# Patient Record
Sex: Male | Born: 1950 | Race: Black or African American | Hispanic: No | Marital: Single | Smoking: Former smoker
Health system: Southern US, Community
[De-identification: ages and names within clinical notes are randomized; demographics above are authoritative.]

## PROBLEM LIST (undated history)

## (undated) ENCOUNTER — Emergency Department: Admission: EM | Source: Home / Self Care

## (undated) DIAGNOSIS — C3432 Malignant neoplasm of lower lobe, left bronchus or lung: Secondary | ICD-10-CM

## (undated) DIAGNOSIS — E785 Hyperlipidemia, unspecified: Secondary | ICD-10-CM

## (undated) DIAGNOSIS — C801 Malignant (primary) neoplasm, unspecified: Secondary | ICD-10-CM

## (undated) DIAGNOSIS — I10 Essential (primary) hypertension: Secondary | ICD-10-CM

## (undated) DIAGNOSIS — R0600 Dyspnea, unspecified: Secondary | ICD-10-CM

## (undated) HISTORY — DX: Essential (primary) hypertension: I10

## (undated) HISTORY — DX: Hyperlipidemia, unspecified: E78.5

## (undated) MED FILL — Dexamethasone Sodium Phosphate Inj 100 MG/10ML: INTRAMUSCULAR | Qty: 1 | Status: AC

---

## 2019-09-02 ENCOUNTER — Emergency Department: Payer: Medicaid Other

## 2019-09-02 ENCOUNTER — Encounter: Payer: Self-pay | Admitting: Radiology

## 2019-09-02 ENCOUNTER — Emergency Department
Admission: EM | Admit: 2019-09-02 | Discharge: 2019-09-03 | Disposition: A | Discharge status: Home / Self Care | Payer: Medicaid Other | Attending: Emergency Medicine | Admitting: Emergency Medicine

## 2019-09-02 DIAGNOSIS — R0602 Shortness of breath: Secondary | ICD-10-CM | POA: Diagnosis present

## 2019-09-02 DIAGNOSIS — R918 Other nonspecific abnormal finding of lung field: Secondary | ICD-10-CM | POA: Diagnosis not present

## 2019-09-02 DIAGNOSIS — I10 Essential (primary) hypertension: Secondary | ICD-10-CM | POA: Diagnosis not present

## 2019-09-02 DIAGNOSIS — Z87891 Personal history of nicotine dependence: Secondary | ICD-10-CM | POA: Insufficient documentation

## 2019-09-02 LAB — CBC
HCT: 36.5 % — ABNORMAL LOW (ref 39.0–52.0)
Hemoglobin: 11.7 g/dL — ABNORMAL LOW (ref 13.0–17.0)
MCH: 25.9 pg — ABNORMAL LOW (ref 26.0–34.0)
MCHC: 32.1 g/dL (ref 30.0–36.0)
MCV: 80.9 fL (ref 80.0–100.0)
Platelets: 388 10*3/uL (ref 150–400)
RBC: 4.51 MIL/uL (ref 4.22–5.81)
RDW: 14.8 % (ref 11.5–15.5)
WBC: 10.2 10*3/uL (ref 4.0–10.5)
nRBC: 0 % (ref 0.0–0.2)

## 2019-09-02 LAB — BASIC METABOLIC PANEL
Anion gap: 11 (ref 5–15)
BUN: 16 mg/dL (ref 8–23)
CO2: 22 mmol/L (ref 22–32)
Calcium: 9.2 mg/dL (ref 8.9–10.3)
Chloride: 104 mmol/L (ref 98–111)
Creatinine, Ser: 1.05 mg/dL (ref 0.61–1.24)
GFR calc Af Amer: >60 mL/min (ref >60)
GFR calc non Af Amer: >60 mL/min (ref >60)
Glucose, Bld: 140 mg/dL — ABNORMAL HIGH (ref 70–99)
Potassium: 3.6 mmol/L (ref 3.5–5.1)
Sodium: 137 mmol/L (ref 135–145)

## 2019-09-02 LAB — TROPONIN I (HIGH SENSITIVITY)
Troponin I (High Sensitivity): 8 ng/L (ref <18)
Troponin I (High Sensitivity): 7 ng/L (ref <18)

## 2019-09-02 MED ORDER — IOHEXOL 350 MG/ML SOLN
75.0000 mL | Freq: Once | INTRAVENOUS | Status: AC | PRN
  Administered 2019-09-02: 75 mL via INTRAVENOUS

## 2019-09-02 MED ORDER — IPRATROPIUM-ALBUTEROL 0.5-2.5 (3) MG/3ML IN SOLN
3.0000 mL | Freq: Once | RESPIRATORY_TRACT | Status: AC
  Administered 2019-09-02: 3 mL via RESPIRATORY_TRACT
  Filled 2019-09-02: qty 3

## 2019-09-02 NOTE — ED Provider Notes (Signed)
[facility] Emergency Department Provider Note  ____________________________________________  Time seen: Approximately 11:52 PM  I have reviewed the triage vital signs and the nursing notes.   HISTORY  Chief Complaint Shortness of Breath   HPI Mitchell Frye is a 69 y.o. male with a history of hypertension hyperlipidemia who presents for evaluation of cough and shortness of breath.  Patient reports a cough productive of clear phlegm for over a year.  Over the last week he started to have shortness of breath.  The shortness of breath is worse with exertion.  He denies any wheezing.  He reports that the cough has improved since he was placed on Protonix for possible GERD as the etiology of the cough by his primary care doctor.  He denies hemoptysis.  The phlegm is clear.  No fever.  He also complaining of mild right-sided chest discomfort that he describes as a soreness mostly when he pushes on it.  No pleuritic chest pain.  This chest pain has been ongoing for the last week as well.  No personal or family history of PE or DVT, no recent travel immobilization, no leg pain or swelling, no hemoptysis or exogenous hormones.  Patient denies a history of COPD or emphysema however he does have a history of smoking.  He stopped in the 1990s.  Has not had a fever.  No Covid shots.   PMH HTN HLD  Allergies Patient has no known allergies.  No family history on file.  Social History Smoking - former Alcohol - yes Drugs - no  Review of Systems  Constitutional: Negative for fever. Eyes: Negative for visual changes. ENT: Negative for sore throat. Neck: No neck pain  Cardiovascular: Negative for chest pain. Respiratory: + shortness of breath and cough Gastrointestinal: Negative for abdominal pain, vomiting or diarrhea. Genitourinary: Negative for dysuria. Musculoskeletal: Negative for back pain. Skin: Negative for rash. Neurological: Negative for headaches,  weakness or numbness. Psych: No SI or HI  ____________________________________________   PHYSICAL EXAM:  VITAL SIGNS: ED Triage Vitals  Enc Vitals Group     BP 09/02/19 2014 131/74     Pulse Rate 09/02/19 2014 87     Resp 09/02/19 2014 (!) 22     Temp 09/02/19 2014 99.5 F (37.5 C)     Temp Source 09/02/19 2014 Oral     SpO2 09/02/19 2014 100 %     Weight --      Height --      Head Circumference --      Peak Flow --      Pain Score 09/02/19 2251 3     Pain Loc --      Pain Edu? --      Excl. in [place]? --     Constitutional: Alert and oriented. Well appearing and in no apparent distress. HEENT:      Head: Normocephalic and atraumatic.         Eyes: Conjunctivae are normal. Sclera is non-icteric.       Mouth/Throat: Mucous membranes are moist.       Neck: Supple with no signs of meningismus. Cardiovascular: Regular rate and rhythm. No murmurs, gallops, or rubs. 2+ symmetrical distal pulses are present in all extremities. No JVD. Respiratory: Normal respiratory effort.  Good air movement with faint expiratory wheezes bilaterally.  No crackles gastrointestinal: Soft, non tender. Musculoskeletal: No edema, cyanosis, or erythema of extremities. Neurologic: Normal speech and language. Face is symmetric. Moving all extremities. No gross focal  neurologic deficits are appreciated. Skin: Skin is warm, dry and intact. No rash noted. Psychiatric: Mood and affect are normal. Speech and behavior are normal.  ____________________________________________   LABS (all labs ordered are listed, but only abnormal results are displayed)  Labs Reviewed  BASIC METABOLIC PANEL - Abnormal; Notable for the following components:      Result Value   Glucose, Bld 140 (*)    All other components within normal limits  CBC - Abnormal; Notable for the following components:   Hemoglobin 11.7 (*)    HCT 36.5 (*)    MCH 25.9 (*)    All other components within normal limits  TROPONIN I (HIGH  SENSITIVITY)  TROPONIN I (HIGH SENSITIVITY)   ____________________________________________  EKG  ED ECG REPORT I, Rudene Re, the attending physician, personally viewed and interpreted this ECG.  Normal sinus rhythm, rate of 83, normal intervals, left axis deviation, inferior Q waves, no ST elevations or depressions. no prior for comparison. ____________________________________________  RADIOLOGY  I have personally reviewed the images performed during this visit and I agree with the Radiologist's read.   Interpretation by Radiologist:  DG Chest 2 View  Result Date: 09/02/2019 CLINICAL DATA:  Shortness of breath EXAM: CHEST - 2 VIEW COMPARISON:  None. FINDINGS: No pleural effusion. Rounded opacity in the left infrahilar region. Normal heart size. No pneumothorax. IMPRESSION: Questionable rounded opacity within the left infrahilar lung on one view. Consideration could be given towards chest CT for further evaluation. Electronically Signed   By: Donavan Foil M.D.   On: 09/02/2019 20:54   CT Angio Chest PE W and/or Wo Contrast  Addendum Date: 09/03/2019   ADDENDUM REPORT: 09/03/2019 00:19 ADDENDUM: These results were called by telephone on 09/03/2019 at 12:19 am to provider Dr. Beather Arbour, who verbally acknowledged these results. Electronically Signed   By: Lovena Le M.D.   On: 09/03/2019 00:19   Result Date: 09/03/2019 CLINICAL DATA:  Shortness of breath EXAM: CT ANGIOGRAPHY CHEST WITH CONTRAST TECHNIQUE: Multidetector CT imaging of the chest was performed using the standard protocol during bolus administration of intravenous contrast. Multiplanar CT image reconstructions and MIPs were obtained to evaluate the vascular anatomy. CONTRAST:  69mL OMNIPAQUE IOHEXOL 350 MG/ML SOLN COMPARISON:  Radiograph 09/02/2019, 08/26/2019 FINDINGS: Cardiovascular: No large central or lobar pulmonary artery filling defects however borderline opacification of the pulmonary arteries and respiratory motion  artifact significantly limits evaluation of smaller filling defects beyond this level. Narrowing and abrupt truncation of several of the pulmonary arteries supplying the left lower lobe as they pass through an area focal consolidation. Central pulmonary arteries are normal caliber. The aortic root is suboptimally assessed given cardiac pulsation artifact. Minimal atherosclerotic plaque in the normal caliber thoracic aorta. No acute luminal abnormality. No periaortic stranding or hemorrhage. Shared origin of the brachiocephalic and left common carotid artery. Mediastinum/Nodes: There is soft tissue attenuation measuring approximately 2.9 by 4.0 cm centered in the left hilum (6/49) resulting an abrupt truncation in narrowing of the pulmonary arteries (as detailed above) and the central left lower lobe airways including truncation of several branches including the superior segmental bronchus of the left lower lobe. Additional ipsilateral hilar and subcarinal adenopathy including a 12 mm left sub hilar node (4/40, a 15 mm subcarinal node (4/46) and a prominent 10 mm AP window lymph node (4/39). No contralateral hilar nor axillary adenopathy. No acute abnormality of the more central trachea. Loss of discernible fat planes between the esophagus and adjacent nodal deposits. Thyroid gland and thoracic  inlet are unremarkable. Lungs/Pleura: There is volume loss in the left lower lobe with some more heterogeneous consolidative masslike opacity (6/55) measuring approximately 3.6 x 3.1 cm in size and contiguous with the more central perihilar soft tissue attenuation. Surrounding ground-glass and tree-in-bud nodularity. Additional dependent atelectatic changes seen bilaterally. No other focal nodules or masses. No other consolidation. No pneumothorax or effusion. Upper Abdomen: No acute abnormalities present in the visualized portions of the upper abdomen. Musculoskeletal: No acute osseous abnormality or suspicious osseous  lesion. No worrisome chest wall lesions. Multilevel degenerative changes are present in the imaged portions of the spine. Additional mild degenerative changes in the spine. Review of the MIP images confirms the above findings. IMPRESSION: 1. No large central or lobar pulmonary artery filling defects however borderline opacification of the pulmonary arteries and respiratory motion artifact significantly limits evaluation of smaller filling defects beyond this level. 2. Soft tissue attenuation measuring approximately 2.9 x 4.0 cm centered in the left hilum resulting in an abrupt truncation and narrowing of the pulmonary arteries central airways of the left lower lobe. Additional ipsilateral hilar and subcarinal adenopathy. Findings are most concerning for primary bronchogenic malignancy with nodal metastases. More peripheral 3.6 x 3.1 cm consolidative masslike opacity could reflect a combination of postobstructive pneumonia and atelectasis though direct extension is not fully excluded. Electronically Signed: By: Lovena Le M.D. On: 09/03/2019 00:14     ____________________________________________   PROCEDURES  Procedure(s) performed:yes .1-3 Lead EKG Interpretation Performed by: Rudene Re, MD Authorized by: Rudene Re, MD     Interpretation: normal     ECG rate assessment: normal     Rhythm: sinus rhythm     Ectopy: none     Critical Care performed:  None ____________________________________________   INITIAL IMPRESSION / ASSESSMENT AND PLAN / ED COURSE   69 y.o. male with a history of hypertension hyperlipidemia who presents for evaluation of chronic cough > 1 year and shortness of breath x 1 week.  Patient is well-appearing in no respiratory distress, has normal work of breathing and normal sats, he is moving good air but has faint wheezing bilaterally.  Differential diagnoses including COPD versus bronchitis versus pneumonia versus pulmonary edema versus PE versus  malignancy.  He was recently seen in urgent care for this and checked for tuberculosis 4 days ago which was negative.  Chest x-ray with a questionable opacity on the left.  We will get a CT for better evaluation.  We will get a CT angiogram to rule out a PE.  Since patient is wheezing we will give 2 duo nebs and reassess.  EKG with no evidence of dysrhythmias or ischemia.  Troponin x2 -.  Metabolic panel showing mild hyperglycemia glucose of 140.  Discussed this finding with patient and recommended follow-up for fasting blood glucose.  Patient has mild anemia with a hemoglobin of 11.7 which seems to be chronic. No active bleeding.   History is gathered from patient and his wife was at bedside.  Plan of care discussed with both of them.  Old medical records reviewed.  _________________________ 12:40 AM on 09/03/2019 ----------------------------------------- CT is consistent for a mass with some enlarged lymph nodes, confirmed by radiology.  Most likely malignancy although unable to rule out postobstructive pneumonia per radiology read.  Patient has no fever or leukocytosis and it seems like the cough is chronic.  However the shortness of breath is new over the last week.  Therefore will cover with antibiotics.  Will refer patient to oncology for  outpatient follow-up.  Discussed the findings with patient and his wife and the importance to have close follow-up within a week.  I discussed pretty strict return precautions with them.  Patient be discharged on a course of Augmentin and azithromycin.     _____________________________________________ Please note:  Patient was evaluated in Emergency Department today for the symptoms described in the history of present illness. Patient was evaluated in the context of the global COVID-19 pandemic, which necessitated consideration that the patient might be at risk for infection with the SARS-CoV-2 virus that causes COVID-19. Institutional protocols and algorithms  that pertain to the evaluation of patients at risk for COVID-19 are in a state of rapid change based on information released by regulatory bodies including the CDC and federal and state organizations. These policies and algorithms were followed during the patient's care in the ED.  Some ED evaluations and interventions may be delayed as a result of limited staffing during the pandemic.   [place] Controlled Substance Database was reviewed by me. ____________________________________________   FINAL CLINICAL IMPRESSION(S) / ED DIAGNOSES   Final diagnoses:  Lung mass      NEW MEDICATIONS STARTED DURING THIS VISIT:  ED Discharge Orders         Ordered    amoxicillin-clavulanate (AUGMENTIN) 875-125 MG tablet  2 times daily     Discontinue  Reprint     09/03/19 0040    azithromycin (ZITHROMAX) 250 MG tablet     Discontinue  Reprint     09/03/19 0040           Note:  This document was prepared using Dragon voice recognition software and may include unintentional dictation errors.    Alfred Levins, [place], MD 09/03/19 [phone]

## 2019-09-02 NOTE — ED Triage Notes (Signed)
Pt arrived to triage with sister who pt lives with. Pt poor historian. Per sister, pt has been seen several times over the course of 6 months. Pt seen today at [facility] and called sister to get pt to ED due to chronic infection inflammation to the lungs. Unable to find results at this time. Pt reports cough over 1 year and worsening SOB over the last week.

## 2019-09-02 NOTE — ED Notes (Signed)
Pt uprite on stretcher in exam room with no distress noted; family at bedside; pt assisted into hosp gown & on card monitor; pt reports SHOB and prod cough clear/white sputum; also reports rt upper CP

## 2019-09-03 ENCOUNTER — Encounter: Payer: Self-pay | Admitting: *Deleted

## 2019-09-03 ENCOUNTER — Encounter: Payer: Self-pay | Admitting: Oncology

## 2019-09-03 DIAGNOSIS — R918 Other nonspecific abnormal finding of lung field: Secondary | ICD-10-CM

## 2019-09-03 MED ORDER — AZITHROMYCIN 500 MG PO TABS
500.0000 mg | ORAL_TABLET | Freq: Once | ORAL | Status: AC
  Administered 2019-09-03: 500 mg via ORAL
  Filled 2019-09-03: qty 1

## 2019-09-03 MED ORDER — BENZONATATE 100 MG PO CAPS
100.0000 mg | ORAL_CAPSULE | Freq: Four times a day (QID) | ORAL | 0 refills | Status: DC | PRN
Start: 2019-09-03 — End: 2019-10-04

## 2019-09-03 MED ORDER — AMOXICILLIN-POT CLAVULANATE 875-125 MG PO TABS
1.0000 | ORAL_TABLET | Freq: Once | ORAL | Status: AC
  Administered 2019-09-03: 1 via ORAL
  Filled 2019-09-03: qty 1

## 2019-09-03 MED ORDER — AMOXICILLIN-POT CLAVULANATE 875-125 MG PO TABS: 1.0000 | ORAL_TABLET | Freq: Two times a day (BID) | ORAL | 0 refills | Status: AC

## 2019-09-03 MED ORDER — AZITHROMYCIN 250 MG PO TABS
ORAL_TABLET | ORAL | 0 refills | Status: DC
Start: 2019-09-03 — End: 2019-09-18

## 2019-09-03 NOTE — Discharge Instructions (Addendum)
As explained to you and your wife, your CT scan is concerning for a mass in your lung.  This is most likely cancer and therefore it is imperative that you follow-up with an oncologist as soon as possible.  Make sure to call the clinic of Dr. Janese Banks first thing in the morning.  You should be seen within the next week.  Return to the emergency room if you have worsening shortness of breath or chest pain, if you are coughing up blood, or if you are unable to be seen within a week by oncology.  It is hard for [place] to determine if there is an infection associated with this mass.  Therefore it is important that you take the antibiotics given to you as prescribed.  Your blood work also showed mildly elevated blood glucose.  This is a nonfasting test and therefore it is important they follow-up with your doctor for a fasting blood glucose.

## 2019-09-03 NOTE — Progress Notes (Addendum)
  Oncology Nurse Navigator Documentation  Navigator Location: [facility] (09/03/19 0800) Referral Date to RadOnc/MedOnc: 09/03/19 (09/03/19 0800) )Navigator Encounter Type: Introductory Phone Call (09/03/19 0800)   Abnormal Finding Date: 09/02/19 (09/03/19 0800)                   Treatment Phase: Abnormal Scans (09/03/19 0800) Barriers/Navigation Needs: Coordination of Care (09/03/19 0800)   Interventions: Coordination of Care (09/03/19 0800)   Coordination of Care: Appts;Radiology (09/03/19 0800)           spoke with pt's sister to review upcoming appts. Per authorization team, our clinic is out of network with his insurance and he will need to contact his insurance company to find out which facility accepts his insurance or ask if his plan can be changed so he can utilize in-network benefits. Advised pt's sister to contact pt's insurance company while pt is present and to let me know once she is able to. Contact info given. Instructed that pt may keep appt Friday 7/23 at 9am with knowing that insurance will not be billed and it will out-of-pocket for him to be seen. Pt's sister verbalized understanding and stated will let me know once she contacts his insurance. Nothing further needed at this time.  Issues with insurance has been resolved. Pt's sister updated and informed of scheduled appt with Dr. Janese Banks on Fri 7/23 at [place] at the [facility]. Instructed to call back with any further questions or needs.        Time Spent with Patient: 30 (09/03/19 0800)

## 2019-09-03 NOTE — Progress Notes (Signed)
I received a referral from the ER for this patient who presented with shortness of breath and underwent CT angio to rule out PE.  No pulmonary embolism was seen.  However he was noted to have a soft tissue attenuation 2.9 x 4 cm centered in the left hilum along with additional ipsilateral and subcarinal adenopathy concerning for primary lung cancer.  Also noted to have a 3.6 x 3.1 cm consolidative masslike opacity which may be postobstructive pneumonia.  Overall findings are concerning for lung cancer and he will need a PET CT scan as an outpatient for further evaluation.  I will be seeing him in my clinic to discuss further management  Dr. Randa Evens, MD, MPH [facility] [phone] 09/03/2019 2:16 PM

## 2019-09-03 NOTE — ED Notes (Signed)
Reviewed pt's d/c instructions and f/u with pt & family; both voice good understanding; copy of instructions given to pt; signature pad not working at this time

## 2019-09-06 ENCOUNTER — Encounter: Payer: Self-pay | Admitting: *Deleted

## 2019-09-06 ENCOUNTER — Encounter: Payer: Self-pay | Admitting: Oncology

## 2019-09-06 ENCOUNTER — Inpatient Hospital Stay: Payer: Medicaid Other | Attending: Oncology | Admitting: Oncology

## 2019-09-06 ENCOUNTER — Other Ambulatory Visit: Payer: Self-pay

## 2019-09-06 VITALS — BP 119/78 | HR 81 | Temp 97.5°F | Resp 18 | Wt 197.4 lb

## 2019-09-06 DIAGNOSIS — R079 Chest pain, unspecified: Secondary | ICD-10-CM | POA: Insufficient documentation

## 2019-09-06 DIAGNOSIS — Z79899 Other long term (current) drug therapy: Secondary | ICD-10-CM | POA: Insufficient documentation

## 2019-09-06 DIAGNOSIS — R918 Other nonspecific abnormal finding of lung field: Secondary | ICD-10-CM | POA: Diagnosis present

## 2019-09-06 DIAGNOSIS — R5383 Other fatigue: Secondary | ICD-10-CM | POA: Insufficient documentation

## 2019-09-06 DIAGNOSIS — Z87891 Personal history of nicotine dependence: Secondary | ICD-10-CM | POA: Diagnosis not present

## 2019-09-06 DIAGNOSIS — R0602 Shortness of breath: Secondary | ICD-10-CM | POA: Insufficient documentation

## 2019-09-06 DIAGNOSIS — E785 Hyperlipidemia, unspecified: Secondary | ICD-10-CM | POA: Diagnosis not present

## 2019-09-06 NOTE — Progress Notes (Signed)
Hematology/Oncology Consult note [facility] Telephone:([phone] [phone]  Patient Care Team: Patient, No Pcp Per as PCP - General ([place]) Telford Nab, RN as Oncology Nurse Navigator   Name of the patient: Mitchell Frye  [id number]  1950-10-22    Reason for referral-lung mass   Referring physician-ER referral Dr. Rudene Re  Date of visit: 09/06/19   History of presenting illness- Patient is a 69 year old male with a past medical history significant for hypertension and hyperlipidemia who presented to the ER with symptoms of worsening cough and shortness of breath.  He underwent CT angios chest which did not show any PE.  Soft tissue attenuation measuring 2.9 x 4 cm centered in the left hilum resulting in abrupt angulation and narrowing of the pulmonary arteries and the central left lower lobe airways.  Additional ipsilateral hilar and subcarinal adenopathy.  No contralateral adenopathy.  Consolidative masslike opacity 3.6 x 3.1 cm in size and contiguous with more central perihilar soft tissue attenuation.  Overall findings concerning for primary bronchogenic carcinoma.  Patient referred for further management.  Patient was discharged on oral Augmentin  Patient lives with his sister who is his main caregiver.  He does not drive but is independent of his ADLs.  Reports ongoing fatigue and occasional retrosternal chest pain.  He has been evaluated by GI in the past for reflux as well.  Appetie is fair and weight is stable.  Denies any significant shortness of breath at this time.  Patient is an ex-smoker and smoked for about 20 years but quit smoking back in 1994  ECOG PS- 1  Pain scale- 0   Review of systems- Review of Systems  Constitutional: Positive for malaise/fatigue. Negative for chills, fever and weight loss.  HENT: Negative for congestion, ear discharge and nosebleeds.   Eyes: Negative for blurred vision.  Respiratory:  Negative for cough, hemoptysis, sputum production, shortness of breath and wheezing.   Cardiovascular: Negative for chest pain, palpitations, orthopnea and claudication.  Gastrointestinal: Negative for abdominal pain, blood in stool, constipation, diarrhea, heartburn, melena, nausea and vomiting.  Genitourinary: Negative for dysuria, flank pain, frequency, hematuria and urgency.  Musculoskeletal: Negative for back pain, joint pain and myalgias.  Skin: Negative for rash.  Neurological: Negative for dizziness, tingling, focal weakness, seizures, weakness and headaches.  Endo/Heme/Allergies: Does not bruise/bleed easily.  Psychiatric/Behavioral: Negative for depression and suicidal ideas. The patient does not have insomnia.     No Known Allergies  There are no problems to display for this patient.    Past Medical History:  Diagnosis Date  . Hyperlipidemia   . Hypertension      History reviewed. No pertinent surgical history.  Social History   Socioeconomic History  . Marital status: Single    Spouse name: Not on file  . Number of children: Not on file  . Years of education: Not on file  . Highest education level: Not on file  Occupational History  . Not on file  Tobacco Use  . Smoking status: Former Smoker    Types: Cigarettes  Substance and Sexual Activity  . Alcohol use: Not on file  . Drug use: Not on file  . Sexual activity: Not on file  Other Topics Concern  . Not on file  Social History Narrative  . Not on file   Social Determinants of Health   Financial Resource Strain:   . Difficulty of Paying Living Expenses:   Food Insecurity:   . Worried About Running  Out of Food in the Last Year:   . [place] in the Last Year:   Transportation Needs:   . Lack of Transportation (Medical):   Marland Kitchen Lack of Transportation (Non-Medical):   Physical Activity:   . Days of Exercise per Week:   . Minutes of Exercise per Session:   Stress:   . Feeling of Stress :     Social Connections:   . Frequency of Communication with Friends and Family:   . Frequency of Social Gatherings with Friends and Family:   . Attends Religious Services:   . Active Member of Clubs or Organizations:   . Attends Archivist Meetings:   Marland Kitchen Marital Status:   Intimate Partner Violence:   . Fear of Current or Ex-Partner:   . Emotionally Abused:   Marland Kitchen Physically Abused:   . Sexually Abused:      Family History  Problem Relation Age of Onset  . Cancer Brother      Current Outpatient Medications:  .  amoxicillin-clavulanate (AUGMENTIN) 875-125 MG tablet, Take 1 tablet by mouth 2 (two) times daily for 10 days., Disp: 28 tablet, Rfl: 0 .  azithromycin (ZITHROMAX) 250 MG tablet, Take 1 a day for 4 days, Disp: 4 each, Rfl: 0 .  benzonatate (TESSALON PERLES) 100 MG capsule, Take 1 capsule (100 mg total) by mouth every 6 (six) hours as needed for cough., Disp: 30 capsule, Rfl: 0   Physical exam:  Vitals:   09/06/19 0923  BP: 119/78  Pulse: 81  Resp: 18  Temp: (!) 97.5 F (36.4 C)  TempSrc: Tympanic  SpO2: 98%  Weight: 197 lb 6.4 oz (89.5 kg)   Physical Exam HENT:     Head: Normocephalic and atraumatic.  Eyes:     Pupils: Pupils are equal, round, and reactive to light.  Cardiovascular:     Rate and Rhythm: Normal rate and regular rhythm.     Heart sounds: Normal heart sounds.  Pulmonary:     Effort: Pulmonary effort is normal.     Breath sounds: Normal breath sounds.  Abdominal:     General: Bowel sounds are normal.     Palpations: Abdomen is soft.  Musculoskeletal:     Cervical back: Normal range of motion.  Skin:    General: Skin is warm and dry.  Neurological:     Mental Status: He is alert and oriented to person, place, and time.        CMP Latest Ref Rng & Units 09/02/2019  Glucose 70 - 99 mg/dL 140(H)  BUN 8 - 23 mg/dL 16  Creatinine 0.61 - 1.24 mg/dL 1.05  Sodium 135 - 145 mmol/L 137  Potassium 3.5 - 5.1 mmol/L 3.6  Chloride 98 - 111  mmol/L 104  CO2 22 - 32 mmol/L 22  Calcium 8.9 - 10.3 mg/dL 9.2   CBC Latest Ref Rng & Units 09/02/2019  WBC 4.0 - 10.5 K/uL 10.2  Hemoglobin 13.0 - 17.0 g/dL 11.7(L)  Hematocrit 39 - 52 % 36.5(L)  Platelets 150 - 400 K/uL 388    No images are attached to the encounter.  DG Chest 2 View  Result Date: 09/02/2019 CLINICAL DATA:  Shortness of breath EXAM: CHEST - 2 VIEW COMPARISON:  None. FINDINGS: No pleural effusion. Rounded opacity in the left infrahilar region. Normal heart size. No pneumothorax. IMPRESSION: Questionable rounded opacity within the left infrahilar lung on one view. Consideration could be given towards chest CT for further evaluation. Electronically Signed  By: Donavan Foil M.D.   On: 09/02/2019 20:54   CT Angio Chest PE W and/or Wo Contrast  Addendum Date: 09/03/2019   ADDENDUM REPORT: 09/03/2019 00:19 ADDENDUM: These results were called by telephone on 09/03/2019 at 12:19 am to provider Dr. Beather Arbour, who verbally acknowledged these results. Electronically Signed   By: Lovena Le M.D.   On: 09/03/2019 00:19   Result Date: 09/03/2019 CLINICAL DATA:  Shortness of breath EXAM: CT ANGIOGRAPHY CHEST WITH CONTRAST TECHNIQUE: Multidetector CT imaging of the chest was performed using the standard protocol during bolus administration of intravenous contrast. Multiplanar CT image reconstructions and MIPs were obtained to evaluate the vascular anatomy. CONTRAST:  23mL OMNIPAQUE IOHEXOL 350 MG/ML SOLN COMPARISON:  Radiograph 09/02/2019, 08/26/2019 FINDINGS: Cardiovascular: No large central or lobar pulmonary artery filling defects however borderline opacification of the pulmonary arteries and respiratory motion artifact significantly limits evaluation of smaller filling defects beyond this level. Narrowing and abrupt truncation of several of the pulmonary arteries supplying the left lower lobe as they pass through an area focal consolidation. Central pulmonary arteries are normal caliber. The  aortic root is suboptimally assessed given cardiac pulsation artifact. Minimal atherosclerotic plaque in the normal caliber thoracic aorta. No acute luminal abnormality. No periaortic stranding or hemorrhage. Shared origin of the brachiocephalic and left common carotid artery. Mediastinum/Nodes: There is soft tissue attenuation measuring approximately 2.9 by 4.0 cm centered in the left hilum (6/49) resulting an abrupt truncation in narrowing of the pulmonary arteries (as detailed above) and the central left lower lobe airways including truncation of several branches including the superior segmental bronchus of the left lower lobe. Additional ipsilateral hilar and subcarinal adenopathy including a 12 mm left sub hilar node (4/40, a 15 mm subcarinal node (4/46) and a prominent 10 mm AP window lymph node (4/39). No contralateral hilar nor axillary adenopathy. No acute abnormality of the more central trachea. Loss of discernible fat planes between the esophagus and adjacent nodal deposits. Thyroid gland and thoracic inlet are unremarkable. Lungs/Pleura: There is volume loss in the left lower lobe with some more heterogeneous consolidative masslike opacity (6/55) measuring approximately 3.6 x 3.1 cm in size and contiguous with the more central perihilar soft tissue attenuation. Surrounding ground-glass and tree-in-bud nodularity. Additional dependent atelectatic changes seen bilaterally. No other focal nodules or masses. No other consolidation. No pneumothorax or effusion. Upper Abdomen: No acute abnormalities present in the visualized portions of the upper abdomen. Musculoskeletal: No acute osseous abnormality or suspicious osseous lesion. No worrisome chest wall lesions. Multilevel degenerative changes are present in the imaged portions of the spine. Additional mild degenerative changes in the spine. Review of the MIP images confirms the above findings. IMPRESSION: 1. No large central or lobar pulmonary artery filling  defects however borderline opacification of the pulmonary arteries and respiratory motion artifact significantly limits evaluation of smaller filling defects beyond this level. 2. Soft tissue attenuation measuring approximately 2.9 x 4.0 cm centered in the left hilum resulting in an abrupt truncation and narrowing of the pulmonary arteries central airways of the left lower lobe. Additional ipsilateral hilar and subcarinal adenopathy. Findings are most concerning for primary bronchogenic malignancy with nodal metastases. More peripheral 3.6 x 3.1 cm consolidative masslike opacity could reflect a combination of postobstructive pneumonia and atelectasis though direct extension is not fully excluded. Electronically Signed: By: Lovena Le M.D. On: 09/03/2019 00:14    Assessment and plan- Patient is a 69 y.o. male presenting with shortness of breath.  CT chest shows consolidative  masslike opacity and ipsilateral adenopathy concerning for lung cancer  I have reviewed CT chest images independently and discussed findings with the patient.  Findings of soft tissue attenuation in the left hilum as well as the consolidative opacity seen in the left lower lobe along with ipsilateral adenopathy are concerning for lung cancer.  He will need a PET CT scan for complete staging work-up as well as to further characterize the adenopathy in the mass.  After the PET scan we will discuss the patient's case at tumor board and patient will likely need EBUS guided biopsy.  I will therefore referred him to pulmonary for the same as well.  Will obtain MRI brain with and without contrast after biopsy results are back.  I will tentatively see him on 09/26/2019 to discuss the results of the PET scan biopsy and further management.  If biopsy findings are consistent with lung cancer and patient does not have distant metastatic disease he would benefit from concurrent chemoradiation treatment and appointment with radiation oncology will be  set up on the same day he sees me as well.  We will also send off NGS testing on pathology specimen and this is consistent with malignancy.  Thank you for this kind referral and the opportunity to participate in the care of this patient   Visit Diagnosis 1. Lung mass     Dr. Randa Evens, MD, MPH [facility] [phone] 09/06/2019 12:19 PM

## 2019-09-06 NOTE — Progress Notes (Signed)
  Oncology Nurse Navigator Documentation  Navigator Location: [facility]-Med Onc (09/06/19 1200)   )Navigator Encounter Type: Clinic/MDC (09/06/19 1200)               Multidisiplinary Clinic Date: 09/06/19 (09/06/19 1200) Multidisiplinary Clinic Type: Thoracic (09/06/19 1200)   Patient Visit Type: MedOnc (09/06/19 1200)   Barriers/Navigation Needs: Coordination of Care;Family Concerns (09/06/19 1200)   Interventions: Coordination of Care;Referrals (09/06/19 1200) Referrals: Pulmonary (09/06/19 1200) Coordination of Care: Appts;Radiology (09/06/19 1200)        Acuity: Level 2-Minimal Needs (1-2 Barriers Identified) (09/06/19 1200)    met with patient and his sister during initial consult with Dr. Janese Banks. All questions answered during visit. Reviewed upcoming appts. Contact info given. Instructed to call with any further questions or needs.      Time Spent with Patient: 60 (09/06/19 1200)

## 2019-09-16 ENCOUNTER — Encounter
Admission: RE | Admit: 2019-09-16 | Discharge: 2019-09-16 | Disposition: A | Discharge status: Home / Self Care | Payer: Medicaid Other | Source: Ambulatory Visit | Attending: Oncology | Admitting: Oncology

## 2019-09-16 ENCOUNTER — Other Ambulatory Visit: Payer: Self-pay

## 2019-09-16 DIAGNOSIS — R918 Other nonspecific abnormal finding of lung field: Secondary | ICD-10-CM | POA: Diagnosis present

## 2019-09-16 LAB — GLUCOSE, CAPILLARY: Glucose-Capillary: 82 mg/dL (ref 70–99)

## 2019-09-16 MED ORDER — FLUDEOXYGLUCOSE F - 18 (FDG) INJECTION
9.8000 | Freq: Once | INTRAVENOUS | Status: AC | PRN
  Administered 2019-09-16: 9.818 via INTRAVENOUS

## 2019-09-18 ENCOUNTER — Telehealth: Payer: Self-pay

## 2019-09-18 ENCOUNTER — Other Ambulatory Visit: Payer: Self-pay

## 2019-09-18 ENCOUNTER — Ambulatory Visit (INDEPENDENT_AMBULATORY_CARE_PROVIDER_SITE_OTHER): Payer: Medicaid Other | Admitting: Pulmonary Disease

## 2019-09-18 ENCOUNTER — Encounter: Payer: Self-pay | Admitting: Pulmonary Disease

## 2019-09-18 VITALS — BP 124/80 | HR 71 | Temp 98.2°F | Ht 70.0 in | Wt 189.0 lb

## 2019-09-18 DIAGNOSIS — K219 Gastro-esophageal reflux disease without esophagitis: Secondary | ICD-10-CM | POA: Diagnosis not present

## 2019-09-18 DIAGNOSIS — R918 Other nonspecific abnormal finding of lung field: Secondary | ICD-10-CM

## 2019-09-18 DIAGNOSIS — J449 Chronic obstructive pulmonary disease, unspecified: Secondary | ICD-10-CM

## 2019-09-18 DIAGNOSIS — R590 Localized enlarged lymph nodes: Secondary | ICD-10-CM | POA: Diagnosis not present

## 2019-09-18 MED ORDER — ANORO ELLIPTA 62.5-25 MCG/INH IN AEPB: 1.0000 | INHALATION_SPRAY | Freq: Every day | RESPIRATORY_TRACT | 0 refills | Status: AC

## 2019-09-18 MED ORDER — PANTOPRAZOLE SODIUM 40 MG PO TBEC: 40.0000 mg | DELAYED_RELEASE_TABLET | Freq: Every day | ORAL | 3 refills | Status: DC

## 2019-09-18 NOTE — Patient Instructions (Signed)
We are giving you a trial of the medication called Anoro.  This is an inhaler, take 1 inhalation daily.  Rinse your mouth well after use.   We are scheduling your procedure for    I have sent a prescription to the pharmacy for stronger medicine for your heartburn.

## 2019-09-18 NOTE — Telephone Encounter (Signed)
EBUS scheduled for 09/25/2019 at 1:00.  CPT: 43606,77034 KB:TCYE mass (R91.8)

## 2019-09-18 NOTE — Progress Notes (Signed)
Subjective:    Patient ID: Mitchell Frye, male    DOB: [birth year], 69 y.o.   MRN: [id number]  HPI 69 year old former smoker, who presented to [facility] emergency room on 02 September 2019 with a complaint of a 30-month cough worsened over the last few weeks and shortness of breath.  He is kindly referred by Dr. Randa Evens who saw the patient after his ED visit.  The patient underwent CT angio chest on 02 September 2019 which did not show PE.  However there was a soft tissue mass measuring 2.9 x 4 cm in the left hilum and an additional ipsilateral hilar and subcarinal adenopathy.  There was another masslike opacity contiguous to the more central perihilar mass measuring 3.6 x 3.1 cm.  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  We are asked to evaluate the patient for diagnostic procedure.  Patient received Augmentin at discharge.  He has completed the course.  Continues to have issues with cough productive of copious thick sputum.  Sputum is white to clear.  He does not have any hemoptysis.  He does not endorse any weight loss or anorexia.  He has had worsening gastroesophageal reflux symptoms and heartburn.  He had been given a trial of antireflux medication that he cannot name and stated that this was not helpful.  He has had orthopnea and states that sleeping on several pillows does alleviate some of his symptoms.  Oddly he also states that bike riding helps his symptoms.  He does awaken sometimes during the night feeling short of breath.  He has had no lower extremity edema or calf tenderness.  He voices no other complaint.  PPD was negative, per patient, on 08/29/2019.  Troponins were negative on 02 September 2019 cute changes on EKG.   Review of Systems A 10 point review of systems was performed and it is as noted above otherwise negative.  Past Medical History:  Diagnosis Date  . Hyperlipidemia   . Hypertension    No past surgical history on file.  Family History  Problem Relation Age of Onset    . Cancer Brother    Social History   Tobacco Use  . Smoking status: Former Smoker    Packs/day: 1.00    Years: 20.00    Pack years: 20.00    Types: Cigarettes    Quit date: 1994    Years since quitting: 27.6  Substance Use Topics  . Alcohol use: Not on file   Current Meds  Medication Sig  . benzonatate (TESSALON PERLES) 100 MG capsule Take 1 capsule (100 mg total) by mouth every 6 (six) hours as needed for cough.  . [DISCONTINUED] azithromycin (ZITHROMAX) 250 MG tablet Take 1 a day for 4 days     Immunization History  Administered Date(s) Administered  . Moderna SARS-COVID-2 Vaccination 09/06/2019  . PPD Test 08/26/2019      Objective:   Physical Exam BP 124/80 (BP Location: Left Arm, Cuff Size: Normal)   Pulse 71   Temp 98.2 F (36.8 C) (Temporal)   Ht 5\' 10"  (1.778 m)   Wt 189 lb (85.7 kg)   SpO2 99%   BMI 27.12 kg/m   GENERAL: Awake, alert, no respiratory distress.  Well-developed well-nourished gentleman.  Fully ambulatory. HEAD: Normocephalic, atraumatic.  EYES: Pupils equal, round, reactive to light.  No scleral icterus.  MOUTH: Nose/mouth/throat not examined due to masking requirements for COVID 19. NECK: Supple. No thyromegaly. Trachea midline. No JVD.  No adenopathy. PULMONARY: Good air entry  bilaterally.  Coarse breath sounds.  Somewhat diminished sounds on left base posteriorly. CARDIOVASCULAR: S1 and S2. Regular rate and rhythm.  No rubs, murmurs or gallops heard. GASTROINTESTINAL: Benign. MUSCULOSKELETAL: No joint deformity, there is clubbing of the fingers, no edema.  NEUROLOGIC: No focal deficits, no gait disturbance noted.  Fluent speech. SKIN: Intact,warm,dry.  No overt rashes noted on limited exam. PSYCH: Mood and behavior normal.  Recent Results (from the past 2160 hour(s))  Basic metabolic panel     Status: Abnormal   Collection Time: 09/02/19  8:22 PM  Result Value Ref Range   Sodium 137 135 - 145 mmol/L   Potassium 3.6 3.5 - 5.1 mmol/L    Chloride 104 98 - 111 mmol/L   CO2 22 22 - 32 mmol/L   Glucose, Bld 140 (H) 70 - 99 mg/dL    Comment: Glucose reference range applies only to samples taken after fasting for at least 8 hours.   BUN 16 8 - 23 mg/dL   Creatinine, Ser 1.05 0.61 - 1.24 mg/dL   Calcium 9.2 8.9 - 10.3 mg/dL   GFR calc non Af Amer >60 >60 mL/min   GFR calc Af Amer >60 >60 mL/min   Anion gap 11 5 - 15    Comment: Performed at [facility], [address] [place], [place] [address]  CBC     Status: Abnormal   Collection Time: 09/02/19  8:22 PM  Result Value Ref Range   WBC 10.2 4.0 - 10.5 K/uL   RBC 4.51 4.22 - 5.81 MIL/uL   Hemoglobin 11.7 (L) 13.0 - 17.0 g/dL   HCT 36.5 (L) 39 - 52 %   MCV 80.9 80.0 - 100.0 fL   MCH 25.9 (L) 26.0 - 34.0 pg   MCHC 32.1 30.0 - 36.0 g/dL   RDW 14.8 11.5 - 15.5 %   Platelets 388 150 - 400 K/uL   nRBC 0.0 0.0 - 0.2 %    Comment: Performed at [facility], [place], [place] [address]  Troponin I (High Sensitivity)     Status: None   Collection Time: 09/02/19  8:22 PM  Result Value Ref Range   Troponin I (High Sensitivity) 8 <18 ng/L    Comment: (NOTE) Elevated high sensitivity troponin I (hsTnI) values and significant  changes across serial measurements may suggest ACS but many other  chronic and acute conditions are known to elevate hsTnI results.  Refer to the "Links" section for chest pain algorithms and additional  guidance. Performed at [facility], [place], [place] [address]   Troponin I (High Sensitivity)     Status: None   Collection Time: 09/02/19 11:11 PM  Result Value Ref Range   Troponin I (High Sensitivity) 7 <18 ng/L    Comment: (NOTE) Elevated high sensitivity troponin I (hsTnI) values and significant  changes across serial measurements may suggest ACS but many other  chronic and acute conditions are known to elevate hsTnI results.  Refer to the "Links" section for chest pain algorithms  and additional  guidance. Performed at [facility], [address] [place], [place] [address]   Glucose, capillary     Status: None   Collection Time: 09/16/19  9:59 AM  Result Value Ref Range   Glucose-Capillary 82 70 - 99 mg/dL    Comment: Glucose reference range applies only to samples taken after fasting for at least 8 hours.    Representative slices of CT chest performed  02 September 2019:        Representative slice of PET/CT performed 16 September 2019:    Assessment & Plan:     ICD-10-CM   1. Mass of lower lobe of left lung  R91.8    This is carcinoma until proven otherwise Patient will need bronchoscopy with endobronchial ultrasound for diagnosis Patient agrees to proceed  2. Mediastinal adenopathy  R59.0    Carcinoma until proven otherwise EBUS as above  3. COPD suggested by initial evaluation ([place])  J44.9    Trial of Anoro Ellipta 1 inhalation daily Was taught the proper use of the inhaler  4. Gastroesophageal reflux disease, unspecified whether esophagitis present  K21.9    Pantoprazole 40 mg daily Antireflux measures    Meds ordered this encounter  Medications  . pantoprazole (PROTONIX) 40 MG tablet    Sig: Take 1 tablet (40 mg total) by mouth daily.    Dispense:  30 tablet    Refill:  3  . umeclidinium-vilanterol (ANORO ELLIPTA) 62.5-25 MCG/INH AEPB    Sig: Inhale 1 puff into the lungs daily for 1 day.    Dispense:  7 each    Refill:  0    Order Specific Question:   Lot Number?    Answer:   [id number]    Order Specific Question:   Expiration Date?    Answer:   10/15/2020    Order Specific Question:   Manufacturer?    Answer:   [employer] [[id number]]    Order Specific Question:   Quantity    Answer:   3   Discussion:  Patient has left lower lobe mass with associated mediastinal adenopathy that is FDG avid.  This is carcinoma until proven otherwise.  The patient will need bronchoscopy with endobronchial ultrasound to make the diagnosis.  Patient is  aware that this procedure will need to be done under general anesthesia.  Benefits, limitations and potential complications of the procedure were discussed with the patient including, but not limited to bleeding, hemoptysis, respiratory failure requiring intubation and/or prolongued mechanical ventilation, infection, pneumothorax (collapse of lung) requiring chest tube placement, stroke or even death.  Patient agrees to proceed.  As above we have given the patient a trial of Anoro Ellipta it appears that he has some issues with chronic bronchitis.  In addition will also give the patient a trial of Protonix to see if this helps his gastroesophageal reflux symptoms.  The patient will need PFTs in the future however we need to expedite his diagnostic procedure and PFTs will be deferred for afterwards.  We will see the patient on follow-up TBD post procedure.  Patient has oncology follow-ups already with Dr. Janese Banks and Dr. Baruch Gouty.  Renold Don, MD [facility]   *This note was dictated using voice recognition software/Dragon.  Despite best efforts to proofread, errors can occur which can change the meaning.  Any change was purely unintentional.

## 2019-09-18 NOTE — H&P (View-Only) (Signed)
Subjective:    Patient ID: Mitchell Frye, male    DOB: September 07, 1950, 69 y.o.   MRN: [id number]  HPI 69 year old former smoker, who presented to [facility] emergency room on 02 September 2019 with a complaint of a 55-month cough worsened over the last few weeks and shortness of breath.  He is kindly referred by Dr. Randa Evens who saw the patient after his ED visit.  The patient underwent CT angio chest on 02 September 2019 which did not show PE.  However there was a soft tissue mass measuring 2.9 x 4 cm in the left hilum and an additional ipsilateral hilar and subcarinal adenopathy.  There was another masslike opacity contiguous to the more central perihilar mass measuring 3.6 x 3.1 cm.  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  We are asked to evaluate the patient for diagnostic procedure.  Patient received Augmentin at discharge.  He has completed the course.  Continues to have issues with cough productive of copious thick sputum.  Sputum is white to clear.  He does not have any hemoptysis.  He does not endorse any weight loss or anorexia.  He has had worsening gastroesophageal reflux symptoms and heartburn.  He had been given a trial of antireflux medication that he cannot name and stated that this was not helpful.  He has had orthopnea and states that sleeping on several pillows does alleviate some of his symptoms.  Oddly he also states that bike riding helps his symptoms.  He does awaken sometimes during the night feeling short of breath.  He has had no lower extremity edema or calf tenderness.  He voices no other complaint.  PPD was negative, per patient, on 08/29/2019.  Troponins were negative on 02 September 2019 cute changes on EKG.   Review of Systems A 10 point review of systems was performed and it is as noted above otherwise negative.  Past Medical History:  Diagnosis Date  . Hyperlipidemia   . Hypertension    No past surgical history on file.  Family History  Problem Relation Age of Onset    . Cancer Brother    Social History   Tobacco Use  . Smoking status: Former Smoker    Packs/day: 1.00    Years: 20.00    Pack years: 20.00    Types: Cigarettes    Quit date: 1994    Years since quitting: 27.6  Substance Use Topics  . Alcohol use: Not on file   Current Meds  Medication Sig  . benzonatate (TESSALON PERLES) 100 MG capsule Take 1 capsule (100 mg total) by mouth every 6 (six) hours as needed for cough.  . [DISCONTINUED] azithromycin (ZITHROMAX) 250 MG tablet Take 1 a day for 4 days     Immunization History  Administered Date(s) Administered  . Moderna SARS-COVID-2 Vaccination 09/06/2019  . PPD Test 08/26/2019      Objective:   Physical Exam BP 124/80 (BP Location: Left Arm, Cuff Size: Normal)   Pulse 71   Temp 98.2 F (36.8 C) (Temporal)   Ht 5\' 10"  (1.778 m)   Wt 189 lb (85.7 kg)   SpO2 99%   BMI 27.12 kg/m   GENERAL: Awake, alert, no respiratory distress.  Well-developed well-nourished gentleman.  Fully ambulatory. HEAD: Normocephalic, atraumatic.  EYES: Pupils equal, round, reactive to light.  No scleral icterus.  MOUTH: Nose/mouth/throat not examined due to masking requirements for COVID 19. NECK: Supple. No thyromegaly. Trachea midline. No JVD.  No adenopathy. PULMONARY: Good air entry  bilaterally.  Coarse breath sounds.  Somewhat diminished sounds on left base posteriorly. CARDIOVASCULAR: S1 and S2. Regular rate and rhythm.  No rubs, murmurs or gallops heard. GASTROINTESTINAL: Benign. MUSCULOSKELETAL: No joint deformity, there is clubbing of the fingers, no edema.  NEUROLOGIC: No focal deficits, no gait disturbance noted.  Fluent speech. SKIN: Intact,warm,dry.  No overt rashes noted on limited exam. PSYCH: Mood and behavior normal.  Recent Results (from the past 2160 hour(s))  Basic metabolic panel     Status: Abnormal   Collection Time: 09/02/19  8:22 PM  Result Value Ref Range   Sodium 137 135 - 145 mmol/L   Potassium 3.6 3.5 - 5.1 mmol/L    Chloride 104 98 - 111 mmol/L   CO2 22 22 - 32 mmol/L   Glucose, Bld 140 (H) 70 - 99 mg/dL    Comment: Glucose reference range applies only to samples taken after fasting for at least 8 hours.   BUN 16 8 - 23 mg/dL   Creatinine, Ser 1.05 0.61 - 1.24 mg/dL   Calcium 9.2 8.9 - 10.3 mg/dL   GFR calc non Af Amer >60 >60 mL/min   GFR calc Af Amer >60 >60 mL/min   Anion gap 11 5 - 15    Comment: Performed at [facility], [address] [place], [place] [address]  CBC     Status: Abnormal   Collection Time: 09/02/19  8:22 PM  Result Value Ref Range   WBC 10.2 4.0 - 10.5 K/uL   RBC 4.51 4.22 - 5.81 MIL/uL   Hemoglobin 11.7 (L) 13.0 - 17.0 g/dL   HCT 36.5 (L) 39 - 52 %   MCV 80.9 80.0 - 100.0 fL   MCH 25.9 (L) 26.0 - 34.0 pg   MCHC 32.1 30.0 - 36.0 g/dL   RDW 14.8 11.5 - 15.5 %   Platelets 388 150 - 400 K/uL   nRBC 0.0 0.0 - 0.2 %    Comment: Performed at [facility], [place], [place] [address]  Troponin I (High Sensitivity)     Status: None   Collection Time: 09/02/19  8:22 PM  Result Value Ref Range   Troponin I (High Sensitivity) 8 <18 ng/L    Comment: (NOTE) Elevated high sensitivity troponin I (hsTnI) values and significant  changes across serial measurements may suggest ACS but many other  chronic and acute conditions are known to elevate hsTnI results.  Refer to the "Links" section for chest pain algorithms and additional  guidance. Performed at [facility], [place],  [address]   Troponin I (High Sensitivity)     Status: None   Collection Time: 09/02/19 11:11 PM  Result Value Ref Range   Troponin I (High Sensitivity) 7 <18 ng/L    Comment: (NOTE) Elevated high sensitivity troponin I (hsTnI) values and significant  changes across serial measurements may suggest ACS but many other  chronic and acute conditions are known to elevate hsTnI results.  Refer to the "Links" section for chest pain algorithms  and additional  guidance. Performed at [facility], [address] [place],  [address]   Glucose, capillary     Status: None   Collection Time: 09/16/19  9:59 AM  Result Value Ref Range   Glucose-Capillary 82 70 - 99 mg/dL    Comment: Glucose reference range applies only to samples taken after fasting for at least 8 hours.    Representative slices of CT chest performed  02 September 2019:        Representative slice of PET/CT performed 16 September 2019:    Assessment & Plan:     ICD-10-CM   1. Mass of lower lobe of left lung  R91.8    This is carcinoma until proven otherwise Patient will need bronchoscopy with endobronchial ultrasound for diagnosis Patient agrees to proceed  2. Mediastinal adenopathy  R59.0    Carcinoma until proven otherwise EBUS as above  3. COPD suggested by initial evaluation ([place])  J44.9    Trial of Anoro Ellipta 1 inhalation daily Was taught the proper use of the inhaler  4. Gastroesophageal reflux disease, unspecified whether esophagitis present  K21.9    Pantoprazole 40 mg daily Antireflux measures    Meds ordered this encounter  Medications  . pantoprazole (PROTONIX) 40 MG tablet    Sig: Take 1 tablet (40 mg total) by mouth daily.    Dispense:  30 tablet    Refill:  3  . umeclidinium-vilanterol (ANORO ELLIPTA) 62.5-25 MCG/INH AEPB    Sig: Inhale 1 puff into the lungs daily for 1 day.    Dispense:  7 each    Refill:  0    Order Specific Question:   Lot Number?    Answer:   [id number]    Order Specific Question:   Expiration Date?    Answer:   10/15/2020    Order Specific Question:   Manufacturer?    Answer:   [employer] [12]    Order Specific Question:   Quantity    Answer:   3   Discussion:  Patient has left lower lobe mass with associated mediastinal adenopathy that is FDG avid.  This is carcinoma until proven otherwise.  The patient will need bronchoscopy with endobronchial ultrasound to make the diagnosis.  Patient is  aware that this procedure will need to be done under general anesthesia.  Benefits, limitations and potential complications of the procedure were discussed with the patient including, but not limited to bleeding, hemoptysis, respiratory failure requiring intubation and/or prolongued mechanical ventilation, infection, pneumothorax (collapse of lung) requiring chest tube placement, stroke or even death.  Patient agrees to proceed.  As above we have given the patient a trial of Anoro Ellipta it appears that he has some issues with chronic bronchitis.  In addition will also give the patient a trial of Protonix to see if this helps his gastroesophageal reflux symptoms.  The patient will need PFTs in the future however we need to expedite his diagnostic procedure and PFTs will be deferred for afterwards.  We will see the patient on follow-up TBD post procedure.  Patient has oncology follow-ups already with Dr. Janese Banks and Dr. Baruch Gouty.  Renold Don, MD [place] [facility]   *This note was dictated using voice recognition software/Dragon.  Despite best efforts to proofread, errors can occur which can change the meaning.  Any change was purely unintentional.

## 2019-09-19 NOTE — Telephone Encounter (Signed)
Pre admit phone visit 09/20/2019 between 1-5 and covid test 8-9 between 8-1. Pt's sister, Elaine(DPR) is aware of dates/times and voiced his understanding.  Nothing further is needed at this time.

## 2019-09-20 ENCOUNTER — Encounter: Payer: Self-pay | Admitting: Pulmonary Disease

## 2019-09-20 ENCOUNTER — Other Ambulatory Visit: Payer: Self-pay

## 2019-09-20 ENCOUNTER — Encounter
Admission: RE | Admit: 2019-09-20 | Discharge: 2019-09-20 | Disposition: A | Discharge status: Home / Self Care | Payer: Medicaid Other | Source: Ambulatory Visit | Attending: Pulmonary Disease | Admitting: Pulmonary Disease

## 2019-09-20 HISTORY — DX: Dyspnea, unspecified: R06.00

## 2019-09-20 NOTE — Telephone Encounter (Signed)
[id number] and [phone] has been approved by [facility]. Authorization # [id number] Valid from 09/25/2019 to 12/24/2019.    Called and left message on voice mail for Estill Bamberg at the pre service center of the approval. Nothing else needed at this time. Rhonda J Cobb

## 2019-09-20 NOTE — Telephone Encounter (Signed)
09/20/2019 at 10:15 am EST spoke with Estill Bamberg at [facility]. Procedure code [phone] and [phone] does require PA.  Pending Ref # [id number]. Estill Bamberg stated that these authorizations usually take anywhere from 5-15 business days. Since pt's procedure is scheduled for 09/25/2019, I will contact [facility] back on Monday 09/23/2019 and check status of authorization. If this has not been approved at that time, [facility] will do an accelerated authorization.  LMOVM for Estill Bamberg at the pre service center of the above.  Will check on status Monday 09/23/2019. Rhonda J Cobb

## 2019-09-20 NOTE — Patient Instructions (Signed)
Your procedure is scheduled on: 09/25/19 Report to [place]. To find out your arrival time please call [phone] between 1PM - 3PM on 09/24/19.  Remember: Instructions that are not followed completely may result in serious medical risk, up to and including death, or upon the discretion of your surgeon and anesthesiologist your surgery may need to be rescheduled.     _X__ 1. Do not eat food after midnight the night before your procedure.                 No gum chewing or hard candies. You may drink clear liquids up to 2 hours                 before you are scheduled to arrive for your surgery- DO not drink clear                 liquids within 2 hours of the start of your surgery.                 Clear Liquids include:  water, apple juice without pulp, clear carbohydrate                 drink such as Clearfast or Gatorade, Black Coffee or Tea (Do not add                 anything to coffee or tea). Diabetics water only  __X__2.  On the morning of surgery brush your teeth with toothpaste and water, you                 may rinse your mouth with mouthwash if you wish.  Do not swallow any              toothpaste of mouthwash.     _X__ 3.  No Alcohol for 24 hours before or after surgery.   _X__ 4.  Do Not Smoke or use e-cigarettes For 24 Hours Prior to Your Surgery.                 Do not use any chewable tobacco products for at least 6 hours prior to                 surgery.  ____  5.  Bring all medications with you on the day of surgery if instructed.   __X__  6.  Notify your doctor if there is any change in your medical condition      (cold, fever, infections).     Do not wear jewelry, make-up, hairpins, clips or nail polish. Do not wear lotions, powders, or perfumes.  Do not shave 48 hours prior to surgery. Men may shave face and neck. Do not bring valuables to the hospital.    [facility] is not responsible for any belongings or  valuables.  Contacts, dentures/partials or body piercings may not be worn into surgery. Bring a case for your contacts, glasses or hearing aids, a denture cup will be supplied. Leave your suitcase in the car. After surgery it may be brought to your room. For patients admitted to the hospital, discharge time is determined by your treatment team.   Patients discharged the day of surgery will not be allowed to drive home.   Please read over the following fact sheets that you were given:   MRSA Information  __X__ Take these medicines the morning of surgery with A SIP OF WATER:  1. pantoprazole (PROTONIX) 40 MG tablet  2.   3.   4.  5.  6.  ____ Fleet Enema (as directed)   __X__ Use CHG Soap/SAGE wipes as directed  ____ Use inhalers on the day of surgery  ____ Stop metformin/Janumet/Farxiga 2 days prior to surgery    ____ Take 1/2 of usual insulin dose the night before surgery. No insulin the morning          of surgery.   ____ Stop Blood Thinners Coumadin/Plavix/Xarelto/Pleta/Pradaxa/Eliquis/Effient/Aspirin  on   Or contact your Surgeon, Cardiologist or Medical Doctor regarding  ability to stop your blood thinners  __X__ Stop Anti-inflammatories 7 days before surgery such as Advil, Ibuprofen, Motrin,  BC or Goodies Powder, Naprosyn, Naproxen, Aleve, Aspirin    __X__ Stop all herbal supplements, fish oil or vitamin E until after surgery.    ____ Bring C-Pap to the hospital.

## 2019-09-20 NOTE — Pre-Procedure Instructions (Signed)
Copied from ED note from 09/02/19  EKG  ED ECG REPORT I, Rudene Re, the attending physician, personally viewed and interpreted this ECG.  Normal sinus rhythm, rate of 83, normal intervals, left axis deviation, inferior Q waves, no ST elevations or depressions. no prior for comparison

## 2019-09-23 ENCOUNTER — Other Ambulatory Visit: Payer: Self-pay

## 2019-09-23 ENCOUNTER — Other Ambulatory Visit
Admission: RE | Admit: 2019-09-23 | Discharge: 2019-09-23 | Disposition: A | Discharge status: Home / Self Care | Payer: Medicaid Other | Source: Ambulatory Visit | Attending: Pulmonary Disease | Admitting: Pulmonary Disease

## 2019-09-23 DIAGNOSIS — Z01812 Encounter for preprocedural laboratory examination: Secondary | ICD-10-CM | POA: Diagnosis present

## 2019-09-23 DIAGNOSIS — Z20822 Contact with and (suspected) exposure to covid-19: Secondary | ICD-10-CM | POA: Insufficient documentation

## 2019-09-24 LAB — SARS CORONAVIRUS 2 (TAT 6-24 HRS): SARS Coronavirus 2: NEGATIVE

## 2019-09-25 ENCOUNTER — Ambulatory Visit
Admission: RE | Admit: 2019-09-25 | Discharge: 2019-09-25 | Disposition: A | Discharge status: Home / Self Care | Payer: Medicaid Other | Attending: Pulmonary Disease | Admitting: Pulmonary Disease

## 2019-09-25 ENCOUNTER — Ambulatory Visit: Payer: Medicaid Other | Admitting: Anesthesiology

## 2019-09-25 ENCOUNTER — Encounter
Admission: RE | Disposition: A | Discharge status: Home / Self Care | Payer: Self-pay | Source: Home / Self Care | Attending: Pulmonary Disease

## 2019-09-25 ENCOUNTER — Encounter: Payer: Self-pay | Admitting: Pulmonary Disease

## 2019-09-25 ENCOUNTER — Other Ambulatory Visit: Payer: Self-pay

## 2019-09-25 DIAGNOSIS — C3432 Malignant neoplasm of lower lobe, left bronchus or lung: Secondary | ICD-10-CM | POA: Diagnosis not present

## 2019-09-25 DIAGNOSIS — R918 Other nonspecific abnormal finding of lung field: Secondary | ICD-10-CM

## 2019-09-25 DIAGNOSIS — I10 Essential (primary) hypertension: Secondary | ICD-10-CM | POA: Diagnosis not present

## 2019-09-25 DIAGNOSIS — K219 Gastro-esophageal reflux disease without esophagitis: Secondary | ICD-10-CM | POA: Insufficient documentation

## 2019-09-25 DIAGNOSIS — C771 Secondary and unspecified malignant neoplasm of intrathoracic lymph nodes: Secondary | ICD-10-CM | POA: Diagnosis not present

## 2019-09-25 DIAGNOSIS — Z87891 Personal history of nicotine dependence: Secondary | ICD-10-CM | POA: Insufficient documentation

## 2019-09-25 DIAGNOSIS — J449 Chronic obstructive pulmonary disease, unspecified: Secondary | ICD-10-CM | POA: Insufficient documentation

## 2019-09-25 DIAGNOSIS — R590 Localized enlarged lymph nodes: Secondary | ICD-10-CM

## 2019-09-25 HISTORY — PX: VIDEO BRONCHOSCOPY WITH ENDOBRONCHIAL ULTRASOUND: SHX6177

## 2019-09-25 SURGERY — BRONCHOSCOPY, WITH EBUS
Anesthesia: General

## 2019-09-25 MED ORDER — GABAPENTIN 100 MG PO CAPS: 100.0000 mg | ORAL_CAPSULE | Freq: Three times a day (TID) | ORAL | 0 refills | Status: DC

## 2019-09-25 MED ORDER — BUTAMBEN-TETRACAINE-BENZOCAINE 2-2-14 % EX AERO
1.0000 | INHALATION_SPRAY | Freq: Once | CUTANEOUS | Status: DC
  Filled 2019-09-25: qty 20

## 2019-09-25 MED ORDER — ONDANSETRON HCL 4 MG/2ML IJ SOLN
INTRAMUSCULAR | Status: AC
  Filled 2019-09-25: qty 2

## 2019-09-25 MED ORDER — SUGAMMADEX SODIUM 200 MG/2ML IV SOLN
INTRAVENOUS | Status: DC | PRN
  Administered 2019-09-25: 200 mg via INTRAVENOUS

## 2019-09-25 MED ORDER — SUCCINYLCHOLINE CHLORIDE 20 MG/ML IJ SOLN
INTRAMUSCULAR | Status: DC | PRN
  Administered 2019-09-25: 100 mg via INTRAVENOUS

## 2019-09-25 MED ORDER — CHLORHEXIDINE GLUCONATE 0.12 % MT SOLN: 15.0000 mL | Freq: Once | OROMUCOSAL | Status: AC

## 2019-09-25 MED ORDER — FENTANYL CITRATE (PF) 100 MCG/2ML IJ SOLN
INTRAMUSCULAR | Status: AC
  Filled 2019-09-25: qty 2

## 2019-09-25 MED ORDER — IPRATROPIUM-ALBUTEROL 0.5-2.5 (3) MG/3ML IN SOLN
RESPIRATORY_TRACT | Status: AC
  Filled 2019-09-25: qty 3

## 2019-09-25 MED ORDER — FENTANYL CITRATE (PF) 100 MCG/2ML IJ SOLN: 25.0000 ug | INTRAMUSCULAR | Status: DC | PRN

## 2019-09-25 MED ORDER — LIDOCAINE HCL (CARDIAC) PF 100 MG/5ML IV SOSY
PREFILLED_SYRINGE | INTRAVENOUS | Status: DC | PRN
  Administered 2019-09-25: 60 mg via INTRAVENOUS

## 2019-09-25 MED ORDER — PROPOFOL 10 MG/ML IV BOLUS
INTRAVENOUS | Status: DC | PRN
  Administered 2019-09-25: 150 mg via INTRAVENOUS

## 2019-09-25 MED ORDER — DEXAMETHASONE SODIUM PHOSPHATE 10 MG/ML IJ SOLN
INTRAMUSCULAR | Status: DC | PRN
  Administered 2019-09-25: 10 mg via INTRAVENOUS

## 2019-09-25 MED ORDER — ONDANSETRON HCL 4 MG/2ML IJ SOLN: 4.0000 mg | Freq: Once | INTRAMUSCULAR | Status: DC | PRN

## 2019-09-25 MED ORDER — DEXAMETHASONE SODIUM PHOSPHATE 10 MG/ML IJ SOLN
INTRAMUSCULAR | Status: AC
  Filled 2019-09-25: qty 1

## 2019-09-25 MED ORDER — ORAL CARE MOUTH RINSE: 15.0000 mL | Freq: Once | OROMUCOSAL | Status: AC

## 2019-09-25 MED ORDER — ONDANSETRON HCL 4 MG/2ML IJ SOLN
INTRAMUSCULAR | Status: DC | PRN
  Administered 2019-09-25: 4 mg via INTRAVENOUS

## 2019-09-25 MED ORDER — CHLORHEXIDINE GLUCONATE 0.12 % MT SOLN
OROMUCOSAL | Status: AC
  Administered 2019-09-25: 15 mL via OROMUCOSAL
  Filled 2019-09-25: qty 15

## 2019-09-25 MED ORDER — SODIUM CHLORIDE 0.9 % IV SOLN: Freq: Once | INTRAVENOUS | Status: AC

## 2019-09-25 MED ORDER — LIDOCAINE HCL (PF) 2 % IJ SOLN
INTRAMUSCULAR | Status: AC
  Filled 2019-09-25: qty 5

## 2019-09-25 MED ORDER — ROCURONIUM BROMIDE 10 MG/ML (PF) SYRINGE
PREFILLED_SYRINGE | INTRAVENOUS | Status: AC
  Filled 2019-09-25: qty 10

## 2019-09-25 MED ORDER — SUCCINYLCHOLINE CHLORIDE 200 MG/10ML IV SOSY
PREFILLED_SYRINGE | INTRAVENOUS | Status: AC
  Filled 2019-09-25: qty 10

## 2019-09-25 MED ORDER — TRAMADOL HCL 50 MG PO TABS: 50.0000 mg | ORAL_TABLET | Freq: Four times a day (QID) | ORAL | 0 refills | Status: AC | PRN

## 2019-09-25 MED ORDER — HYDROCOD POLST-CPM POLST ER 10-8 MG/5ML PO SUER
5.0000 mL | Freq: Once | ORAL | Status: AC
  Administered 2019-09-25: 5 mL via ORAL
  Filled 2019-09-25: qty 5

## 2019-09-25 MED ORDER — FENTANYL CITRATE (PF) 100 MCG/2ML IJ SOLN
INTRAMUSCULAR | Status: DC | PRN
  Administered 2019-09-25 (×2): 50 ug via INTRAVENOUS

## 2019-09-25 MED ORDER — IPRATROPIUM-ALBUTEROL 0.5-2.5 (3) MG/3ML IN SOLN
3.0000 mL | Freq: Once | RESPIRATORY_TRACT | Status: AC
  Administered 2019-09-25: 3 mL via RESPIRATORY_TRACT

## 2019-09-25 MED ORDER — ROCURONIUM BROMIDE 100 MG/10ML IV SOLN
INTRAVENOUS | Status: DC | PRN
  Administered 2019-09-25: 5 mg via INTRAVENOUS
  Administered 2019-09-25: 25 mg via INTRAVENOUS
  Administered 2019-09-25: 10 mg via INTRAVENOUS

## 2019-09-25 NOTE — Anesthesia Procedure Notes (Signed)
Procedure Name: Intubation Date/Time: 09/25/2019 1:05 PM Performed by: Jonna Clark, CRNA Pre-anesthesia Checklist: Patient identified, Patient being monitored, Timeout performed, Emergency Drugs available and Suction available Patient Re-evaluated:Patient Re-evaluated prior to induction Oxygen Delivery Method: Circle system utilized Preoxygenation: Pre-oxygenation with 100% oxygen Induction Type: IV induction Ventilation: Mask ventilation without difficulty Laryngoscope Size: 4 and McGraph Grade View: Grade I Tube type: Oral Tube size: 9.0 mm Number of attempts: 1 Airway Equipment and Method: Stylet Placement Confirmation: ETT inserted through vocal cords under direct vision,  positive ETCO2 and breath sounds checked- equal and bilateral Secured at: 24 cm Tube secured with: Tape Dental Injury: Teeth and Oropharynx as per pre-operative assessment

## 2019-09-25 NOTE — Anesthesia Postprocedure Evaluation (Signed)
Anesthesia Post Note  Patient: Mitchell Frye  Procedure(s) Performed: VIDEO BRONCHOSCOPY WITH ENDOBRONCHIAL ULTRASOUND (N/A )  Patient location during evaluation: PACU Anesthesia Type: General Level of consciousness: awake and alert Pain management: pain level controlled Vital Signs Assessment: post-procedure vital signs reviewed and stable Respiratory status: spontaneous breathing and respiratory function stable Cardiovascular status: stable Anesthetic complications: no   No complications documented.   Last Vitals:  Vitals:   09/25/19 1442 09/25/19 1513  BP:  139/84  Pulse: 80   Resp: (!) 23   Temp:  (!) 36.2 C  SpO2: 98% 96%    Last Pain:  Vitals:   09/25/19 1513  TempSrc:   PainSc: 0-No pain                 Skyah Hannon K

## 2019-09-25 NOTE — Discharge Instructions (Signed)

## 2019-09-25 NOTE — Interval H&P Note (Signed)
History and Physical Interval Note:  09/25/2019 12:39 PM  Mitchell Frye  has presented today for surgery, with the diagnosis of LEFT LUNG MASS.  The various methods of treatment have been discussed with the patient and family. After consideration of risks, benefits and other options for treatment, the patient has consented to  Procedure(s): [place] (N/A) as a surgical intervention.  The patient's history has been reviewed, patient examined, no change in status, stable for surgery.  I have reviewed the patient's chart and labs.  Questions were answered to the patient's satisfaction.     Renold Don, MD  PCCM

## 2019-09-25 NOTE — Op Note (Signed)
PROCEDURE:  VIDEO BRONCHOSCOPY WITH ENDOBRONCHIAL ULTRASOUND and TBNA   PROCEDURE DATE: 09/25/2019  TIME: 1415 hrs. NAME:  Mitchell Frye  DOB:13-Jul-1950  MRN: [id number] LOC:  ARPO/None    HOSP DAY: _0 @ CODE STATUS:    Full   Indications/Preliminary Diagnosis:  Left lung mass  Mediastinal adenopathy  Consent: (Place X beside choice/s below)  The benefits, risks and possible complications of the procedure were        explained to:  _X__ patient  ___ patient's family  ___ other:___________  who verbalized understanding and gave:  ___ verbal  ___ written  _X__ verbal and written  ___ telephone  ___ other:________ consent.      Unable to obtain consent; procedure performed on emergent basis.     Other:    Benefits, limitations and potential complications of the procedure were discussed with the patient/family  including, but not limited to bleeding, hemoptysis, respiratory failure requiring intubation and/or prolongued mechanical ventilation, infection, pneumothorax (collapse of lung) requiring chest tube placement, stroke or even death.  Patient agrees to proceed.  PRESEDATION ASSESSMENT: History and Physical has been performed. Patient meds and allergies have been reviewed. Presedation airway examination has been performed and documented. Baseline vital signs, sedation score, oxygenation status, and cardiac rhythm were reviewed. Patient was deemed to be in satisfactory condition to undergo the procedure.   Surgeon: Renold Don, MD Assistant/scrub: Annia Belt, RRT Anesthesiologist/CRNA: Dennard Nip MD/[facility] CRNA ROSE available?:  YES, [facility] Nigel Bridgeman)  ANESTHESIA TYPE: General endotracheal  INTRAPROCEDURE MEDICATIONS: SEE ANESTHESIOLOGY RECORDS   PROCEDURE DETAILS: Patient was brought to Procedure Room 2 (Bronchoscopy Suite) where appropriate timeout was performed with the staff and correct patient, name, ID and laterality  confirmed.  The patient was inducted under general anesthesia and intubated by the anesthesia team.  Patient was intubated with #9 ET tube difficulty.  A Portex adapter was placed on the tube.  Once the patient was under adequate general anesthesia the Olympus video bronchoscope was advanced into the existing ET tube via a Portex adapter.  The visible portions of the trachea were normal.  Carina was sharp.  The right tracheobronchial tree was inspected thoroughly and no endobronchial lesions were noted.  Bronchoscope was then brought to the left tracheobronchial tree left upper lobe and lingula subsegments were without any endobronchial lesions.  Advancing the scope into the left lower lobe bronchus towards the posterior subsegments there was narrowing of a subsegmental bronchus and submucosal mass with tumor studding along the LEFT lower lobe medial bronchi.  At this point brushings were performed of this area and grossly atypical cells could be noted on the second pass.  A total of 4 passes with 2 brushes cut into CytoLyt were performed.  Once this was completed biopsies of this area was performed.  The biopsy specimens x10 ROSE examination of one of the tissue samples showed grossly atypical cells.  Once this was completed bronchoalveolar lavage was performed of the left lower lobe bronchus instilling a total of 50 mL of saline yielding 10 mL of aliquot.  Aliquot was placed in CytoLyt for cytology evaluation.  Having completed this portion of the procedure the bronchoscope was retrieved and it was exchanged for an Olympus EBUS scope.  The precarinal, subcarinal and hilar stations were examined with the EBUS scope.  The precarinal nodes noted where approximately 1.5 to 2 cm in diameter.  The subcarinal space had a large lymph node/mass conglomerate that was 4 cm long x 3 cm  wide.  There was no hilar adenopathy on the right.  The subcarinal station was chosen for biopsy and utilizing a 25-gauge Cook EBUS needle  this conglomerate of lymph node/mass was biopsied, ROSE showed that malignant cells were present.  A total of 6 passes were performed.  Sample was placed on CytoLyt.  Once this was completed, examination of the airway showed that there was adequate hemostasis.  Patient received a total of 9 mL of 1% lidocaine via bronchial lavage and the bronchoscope was retrieved.  This point the procedure was terminated.  Patient was allowed to emerge from general anesthesia and was extubated in the Procedure Room without sequela.  He was transferred to the PACU in satisfactory condition.  No overt complications noted.   SPECIMENS (Sites): (Place X beside choice below)  Specimens Description   No Specimens Obtained     Washings   X Lavage  L LL, 10 mL aliquot  X Biopsies  L LL, x10  X Fine Needle Aspirates  TBNA, L LL: X6  X Brushings  L LL, x4 (2 slides, 2 cut brushes)   Sputum    FINDINGS:   Narrowing of the posterior subsegment bronchus LEFT lower lobe studding of the middle subsegment bronchus LEFT lower lobe.  Large subcarinal mass/adenopathy conglomerate   ESTIMATED BLOOD LOSS: Less than 2 mL COMPLICATIONS/RESOLUTION: none  IMPRESSION:POST-PROCEDURE DX:   LEFT lower lobe mass  Subcarinal adenopathy/mass conglomerate   RECOMMENDATION/PLAN:   Follow-up pathology report  Patient has significant chest pain (pre-existing prior to procedure) will start gabapentin and provided tramadol as needed  Follow-up for the patient with oncology.  Will notify patient with results.  Renold Don, MD [place] [facility]   *This note was dictated using voice recognition software/Dragon.  Despite best efforts to proofread, errors can occur which can change the meaning.  Any change was purely unintentional.

## 2019-09-25 NOTE — Transfer of Care (Signed)
Immediate Anesthesia Transfer of Care Note  Patient: Mitchell Frye  Procedure(s) Performed: VIDEO BRONCHOSCOPY WITH ENDOBRONCHIAL ULTRASOUND (N/A )  Patient Location: PACU  Anesthesia Type:General  Level of Consciousness: drowsy and patient cooperative  Airway & Oxygen Therapy: Patient Spontanous Breathing and Patient connected to face mask oxygen  Post-op Assessment: Report given to RN and Post -op Vital signs reviewed and stable  Post vital signs: Reviewed and stable  Last Vitals:  Vitals Value Taken Time  BP 154/93 09/25/19 1414  Temp 35.9 C 09/25/19 1414  Pulse 89 09/25/19 1415  Resp 23 09/25/19 1415  SpO2 100 % 09/25/19 1415  Vitals shown include unvalidated device data.  Last Pain:  Vitals:   09/25/19 1207  TempSrc: Tympanic  PainSc: 0-No pain         Complications: No complications documented.

## 2019-09-25 NOTE — Anesthesia Preprocedure Evaluation (Addendum)
Anesthesia Evaluation  Patient identified by MRN, date of birth, ID band Patient awake    Reviewed: Allergy & Precautions, NPO status , Patient's Chart, lab work & pertinent test results  History of Anesthesia Complications Negative for: history of anesthetic complications  Airway Mallampati: III       Dental   Pulmonary neg sleep apnea, neg COPD, former smoker,           Cardiovascular hypertension (no longer taking meds), (-) Past MI and (-) CHF (-) dysrhythmias (-) Valvular Problems/Murmurs     Neuro/Psych neg Seizures    GI/Hepatic Neg liver ROS, neg GERD  ,  Endo/Other  neg diabetes  Renal/GU negative Renal ROS     Musculoskeletal   Abdominal   Peds  Hematology   Anesthesia Other Findings   Reproductive/Obstetrics                            Anesthesia Physical Anesthesia Plan  ASA: II  Anesthesia Plan: General   Post-op Pain Management:    Induction: Intravenous  PONV Risk Score and Plan: Ondansetron and Dexamethasone  Airway Management Planned: Oral ETT  Additional Equipment:   Intra-op Plan:   Post-operative Plan:   Informed Consent: I have reviewed the patients History and Physical, chart, labs and discussed the procedure including the risks, benefits and alternatives for the proposed anesthesia with the patient or authorized representative who has indicated his/her understanding and acceptance.       Plan Discussed with:   Anesthesia Plan Comments:         Anesthesia Quick Evaluation

## 2019-09-27 ENCOUNTER — Ambulatory Visit: Payer: Medicaid Other | Admitting: Oncology

## 2019-09-27 ENCOUNTER — Other Ambulatory Visit: Payer: Self-pay | Admitting: Oncology

## 2019-09-27 LAB — CYTOLOGY - NON PAP

## 2019-09-27 LAB — SURGICAL PATHOLOGY

## 2019-09-30 ENCOUNTER — Encounter: Payer: Self-pay | Admitting: *Deleted

## 2019-09-30 DIAGNOSIS — C3490 Malignant neoplasm of unspecified part of unspecified bronchus or lung: Secondary | ICD-10-CM

## 2019-09-30 NOTE — Progress Notes (Signed)
  Oncology Nurse Navigator Documentation  Navigator Location: [facility] (09/30/19 1600)   )Navigator Encounter Type: Telephone (09/30/19 1600) Telephone: Appt Confirmation/Clarification (09/30/19 1600)                       Barriers/Navigation Needs: Coordination of Care (09/30/19 1600)   Interventions: Coordination of Care (09/30/19 1600)   Coordination of Care: Appts;Radiology (09/30/19 1600)       phone call made to pt's sister to follow up with patient and review upcoming appts. Informed that pt is scheduled for brain MRI on Thurs 8/19 at 12pm, [facility]. All questions answered during phone call. Instructed to call back with any further questions or needs. Pt's sister verbalized understanding.            Time Spent with Patient: 30 (09/30/19 1600)

## 2019-10-01 ENCOUNTER — Ambulatory Visit: Payer: Medicaid Other

## 2019-10-01 ENCOUNTER — Telehealth: Payer: Self-pay | Admitting: *Deleted

## 2019-10-01 NOTE — Telephone Encounter (Signed)
Prescription called to [facility] and left nondescript message on sister voice mail

## 2019-10-01 NOTE — Telephone Encounter (Signed)
Ativan 0.5 mg PO X1 dose 15-30 min prior

## 2019-10-01 NOTE — Telephone Encounter (Signed)
Sister called requesting that something be ordered to relax patient during his MRI scan. Please advise

## 2019-10-03 ENCOUNTER — Ambulatory Visit
Admission: RE | Admit: 2019-10-03 | Discharge: 2019-10-03 | Disposition: A | Discharge status: Home / Self Care | Payer: Medicaid Other | Source: Ambulatory Visit | Attending: Oncology | Admitting: Oncology

## 2019-10-03 ENCOUNTER — Other Ambulatory Visit: Payer: Self-pay

## 2019-10-03 ENCOUNTER — Other Ambulatory Visit: Payer: Medicaid Other

## 2019-10-03 DIAGNOSIS — C3490 Malignant neoplasm of unspecified part of unspecified bronchus or lung: Secondary | ICD-10-CM

## 2019-10-03 MED ORDER — GADOBUTROL 1 MMOL/ML IV SOLN: 8.0000 mL | Freq: Once | INTRAVENOUS | Status: DC | PRN

## 2019-10-03 NOTE — Progress Notes (Addendum)
Tumor Board Documentation  Mitchell Frye was presented by Dr Duwayne Heck at our Tumor Board on 10/03/2019, which included representatives from medical oncology, radiation oncology, surgical oncology, internal medicine, navigation, pathology, radiology, surgical, pharmacy, research, palliative care, pulmonology.  Mitchell Frye currently presents as a new patient, for [place], for new positive pathology with history of the following treatments: active survellience, surgical intervention(s).  Additionally, we reviewed previous medical and familial history, history of present illness, and recent lab results along with all available histopathologic and imaging studies. The tumor board considered available treatment options and made the following recommendations: Concurrent chemo/RT followed by maintenance immunotherapy    The following procedures/referrals were also placed: No orders of the defined types were placed in this encounter.   Clinical Trial Status: not discussed   Staging used: AJCC Stage Group  AJCC Staging: T2 N3 M: 0 Group: Stage IIIB Adenocarcinoma of Lung   National site-specific guidelines NCCN were discussed with respect to the case.  Tumor board is a meeting of clinicians from various specialty areas who evaluate and discuss patients for whom a multidisciplinary approach is being considered. Final determinations in the plan of care are those of the provider(s). The responsibility for follow up of recommendations given during tumor board is that of the provider.   Today's extended care, comprehensive team conference, Mitchell Frye was not present for the discussion and was not examined.   Multidisciplinary Tumor Board is a multidisciplinary case peer review process.  Decisions discussed in the Multidisciplinary Tumor Board reflect the opinions of the specialists present at the conference without having examined the patient.  Ultimately, treatment and diagnostic decisions rest with the primary  provider(s) and the patient.

## 2019-10-04 ENCOUNTER — Telehealth (INDEPENDENT_AMBULATORY_CARE_PROVIDER_SITE_OTHER): Payer: Self-pay

## 2019-10-04 ENCOUNTER — Encounter: Payer: Self-pay | Admitting: Oncology

## 2019-10-04 ENCOUNTER — Inpatient Hospital Stay: Payer: Medicaid Other | Attending: Oncology | Admitting: Oncology

## 2019-10-04 ENCOUNTER — Other Ambulatory Visit: Payer: Self-pay | Admitting: *Deleted

## 2019-10-04 VITALS — BP 121/85 | HR 78 | Temp 96.8°F | Resp 18 | Wt 186.7 lb

## 2019-10-04 DIAGNOSIS — E785 Hyperlipidemia, unspecified: Secondary | ICD-10-CM | POA: Insufficient documentation

## 2019-10-04 DIAGNOSIS — Z7189 Other specified counseling: Secondary | ICD-10-CM

## 2019-10-04 DIAGNOSIS — Z87891 Personal history of nicotine dependence: Secondary | ICD-10-CM | POA: Insufficient documentation

## 2019-10-04 DIAGNOSIS — C3432 Malignant neoplasm of lower lobe, left bronchus or lung: Secondary | ICD-10-CM | POA: Insufficient documentation

## 2019-10-04 DIAGNOSIS — Z79899 Other long term (current) drug therapy: Secondary | ICD-10-CM | POA: Diagnosis not present

## 2019-10-04 DIAGNOSIS — I10 Essential (primary) hypertension: Secondary | ICD-10-CM | POA: Diagnosis not present

## 2019-10-04 MED ORDER — TRAMADOL HCL 50 MG PO TABS: 50.0000 mg | ORAL_TABLET | Freq: Four times a day (QID) | ORAL | 0 refills | Status: DC | PRN

## 2019-10-04 MED ORDER — BENZONATATE 100 MG PO CAPS: 100.0000 mg | ORAL_CAPSULE | Freq: Four times a day (QID) | ORAL | 0 refills | Status: DC | PRN

## 2019-10-04 MED ORDER — PROCHLORPERAZINE MALEATE 10 MG PO TABS: 10.0000 mg | ORAL_TABLET | Freq: Four times a day (QID) | ORAL | 1 refills | Status: DC | PRN

## 2019-10-04 MED ORDER — ONDANSETRON HCL 8 MG PO TABS: 8.0000 mg | ORAL_TABLET | Freq: Two times a day (BID) | ORAL | 1 refills | Status: DC | PRN

## 2019-10-04 MED ORDER — LIDOCAINE-PRILOCAINE 2.5-2.5 % EX CREA: TOPICAL_CREAM | CUTANEOUS | 3 refills | Status: DC

## 2019-10-04 MED ORDER — LORAZEPAM 0.5 MG PO TABS: 0.5000 mg | ORAL_TABLET | Freq: Four times a day (QID) | ORAL | 0 refills | Status: DC | PRN

## 2019-10-04 MED ORDER — DEXAMETHASONE 4 MG PO TABS: 8.0000 mg | ORAL_TABLET | Freq: Every day | ORAL | 1 refills | Status: DC

## 2019-10-04 NOTE — Telephone Encounter (Signed)
Novella Olive RN:  Gwynne Edinger,  The pt above needs port insertion .  His chemo date is 9/13  He has chemo education 8/26   If you can put him somewhere in between. If you call me with info I will call family Thanks  sherry  Me: Hi Sherry,  I have him scheduled with Dr. Delana Meyer for 10/22/19 with a 11:00  am arrival time to the [facility]. Covid testing on 10/18/19 between 8-1 pm at the [place]. NPO after 12:00 midnight, can take all meds with sips of water and can have one person with him. If this doesn't work let me know.  Thank you  Mickel Baas   The pre-procedure instructions will be mailed also.

## 2019-10-04 NOTE — Progress Notes (Signed)
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  He underwent CT angios chest which did not show any PE.  Soft tissue attenuation measuring 2.9 x 4 cm centered in the left hilum resulting in abrupt angulation and narrowing of the pulmonary arteries and the central left lower lobe airways.  Additional ipsilateral hilar and subcarinal adenopathy.  No contralateral adenopathy.  Consolidative masslike opacity 3.6 x 3.1 cm in size and contiguous with more central perihilar soft tissue attenuation.  Overall findings concerning for primary bronchogenic carcinoma.  Patient referred for further management.  Patient was discharged on oral Augmentin  Patient lives with his sister who is his main caregiver.  He does not drive but is independent of his ADLs.  Reports ongoing fatigue and occasional retrosternal chest pain.  He has been evaluated by GI in the past for reflux as well.  Appetie is fair and weight is stable.  Denies any significant shortness of breath at this time.  Patient is an ex-smoker and smoked for about 20 years but quit smoking back in 1994  PET CT scan showed hypermetabolic mass in the left lower lobe with mediastinal adenopathy and right paratracheal adenopathy.  No evidence of distant metastatic disease.  Pathology was consistent with non-small cell lung  cancer favor adenocarcinoma.  Cells were positive for TTF-1 and negative for p40.  Interval history-patient reports ongoing fatigue but denies other complaints at this time denies any pain.  Appetite and weight have remained stable he could not get his MRI done as he still felt claustrophobic despite taking 1.5 mg of Ativan prior to MRI  ECOG PS-  Pain scale- 1   Review of systems- Review of Systems  Constitutional: Positive for malaise/fatigue. Negative for chills, fever and weight loss.  HENT: Negative for congestion, ear discharge and nosebleeds.   Eyes: Negative for blurred vision.  Respiratory: Negative for cough, hemoptysis, sputum production, shortness of breath and wheezing.   Cardiovascular: Negative for chest pain, palpitations, orthopnea and claudication.  Gastrointestinal: Negative for abdominal pain, blood in stool, constipation, diarrhea, heartburn, melena, nausea and vomiting.  Genitourinary: Negative for dysuria, flank pain, frequency, hematuria and urgency.  Musculoskeletal: Negative for back pain, joint pain and myalgias.  Skin: Negative for rash.  Neurological: Negative for dizziness, tingling, focal weakness, seizures, weakness and headaches.  Endo/Heme/Allergies: Does not bruise/bleed easily.  Psychiatric/Behavioral: Negative for depression and suicidal ideas. The patient does not have insomnia.       No Known Allergies   Past Medical History:  Diagnosis Date  . Dyspnea   . Hyperlipidemia   . Hypertension      Past Surgical History:  Procedure Laterality Date  . VIDEO BRONCHOSCOPY WITH ENDOBRONCHIAL ULTRASOUND N/A 09/25/2019   Procedure: VIDEO BRONCHOSCOPY WITH ENDOBRONCHIAL ULTRASOUND;  Surgeon: Tyler Pita, MD;  Location: [facility] ORS;  Service: Pulmonary;  Laterality: N/A;    Social History   Socioeconomic History  .  Marital status: Single    Spouse name: Not on file  . Number of children: Not on file  . Years of education: Not on file  .  Highest education level: Not on file  Occupational History  . Not on file  Tobacco Use  . Smoking status: Former Smoker    Packs/day: 1.00    Years: 20.00    Pack years: 20.00    Types: Cigarettes    Quit date: 1994    Years since quitting: 27.6  . Smokeless tobacco: Never Used  Vaping Use  . Vaping Use: Never used  Substance and Sexual Activity  . Alcohol use: Yes    Comment: occ  . Drug use: Never  . Sexual activity: Not on file  Other Topics Concern  . Not on file  Social History Narrative  . Not on file   Social Determinants of Health   Financial Resource Strain:   . Difficulty of Paying Living Expenses: Not on file  Food Insecurity:   . Worried About Charity fundraiser in the Last Year: Not on file  . Ran Out of Food in the Last Year: Not on file  Transportation Needs:   . Lack of Transportation (Medical): Not on file  . Lack of Transportation (Non-Medical): Not on file  Physical Activity:   . Days of Exercise per Week: Not on file  . Minutes of Exercise per Session: Not on file  Stress:   . Feeling of Stress : Not on file  Social Connections:   . Frequency of Communication with Friends and Family: Not on file  . Frequency of Social Gatherings with Friends and Family: Not on file  . Attends Religious Services: Not on file  . Active Member of Clubs or Organizations: Not on file  . Attends Archivist Meetings: Not on file  . Marital Status: Not on file  Intimate Partner Violence:   . Fear of Current or Ex-Partner: Not on file  . Emotionally Abused: Not on file  . Physically Abused: Not on file  . Sexually Abused: Not on file    Family History  Problem Relation Age of Onset  . Cancer Brother      Current Outpatient Medications:  .  benzonatate (TESSALON PERLES) 100 MG capsule, Take 1 capsule (100 mg total) by mouth every 6 (six) hours as needed for cough., Disp: 30 capsule, Rfl: 0 .  gabapentin (NEURONTIN) 100 MG capsule, Take 1 capsule (100  mg total) by mouth 3 (three) times daily., Disp: 90 capsule, Rfl: 0 .  pantoprazole (PROTONIX) 40 MG tablet, Take 1 tablet (40 mg total) by mouth daily., Disp: 30 tablet, Rfl: 3 .  acetaminophen (TYLENOL) 500 MG tablet, Take 1,000 mg by mouth every 6 (six) hours as needed for mild pain, moderate pain or headache., Disp: , Rfl:   Physical exam:  Vitals:   10/04/19 1047  BP: 121/85  Pulse: 78  Resp: 18  Temp: (!) 96.8 F (36 C)  TempSrc: Tympanic  SpO2: 99%  Weight: 186 lb 11.2 oz (84.7 kg)   Physical Exam Constitutional:      General: He is not in acute distress. Cardiovascular:     Rate and Rhythm: Normal rate and regular rhythm.     Heart sounds: Normal heart sounds.  Pulmonary:     Effort: Pulmonary effort is normal.     Breath sounds: Normal breath sounds.  Abdominal:     General: Bowel sounds are normal.  Palpations: Abdomen is soft.  Skin:    General: Skin is warm and dry.  Neurological:     Mental Status: He is alert and oriented to person, place, and time.      CMP Latest Ref Rng & Units 09/02/2019  Glucose 70 - 99 mg/dL 140(H)  BUN 8 - 23 mg/dL 16  Creatinine 0.61 - 1.24 mg/dL 1.05  Sodium 135 - 145 mmol/L 137  Potassium 3.5 - 5.1 mmol/L 3.6  Chloride 98 - 111 mmol/L 104  CO2 22 - 32 mmol/L 22  Calcium 8.9 - 10.3 mg/dL 9.2   CBC Latest Ref Rng & Units 09/02/2019  WBC 4.0 - 10.5 K/uL 10.2  Hemoglobin 13.0 - 17.0 g/dL 11.7(L)  Hematocrit 39 - 52 % 36.5(L)  Platelets 150 - 400 K/uL 388    No images are attached to the encounter.  NM PET Image Initial (PI) Skull Base To Thigh  Result Date: 09/16/2019 CLINICAL DATA:  Initial treatment strategy for pulmonary masses and adenopathy. EXAM: NUCLEAR MEDICINE PET SKULL BASE TO THIGH TECHNIQUE: 9.818 mCi F-18 FDG was injected intravenously. Full-ring PET imaging was performed from the skull base to thigh after the radiotracer. CT data was obtained and used for attenuation correction and anatomic localization.  Fasting blood glucose: 82 mg/dl COMPARISON:  CT chest of September 03, 2019 FINDINGS: Mediastinal blood pool activity: SUV max 1.96 Liver activity: SUV max NA NECK: Asymmetric activity in the LEFT oropharynx with some mild asymmetric fullness (image 28, series 3) this measures approximately 7 mm thickness (SUVmax = 5.5 amorphous uptake lateral to this area along pterygoid musculature without discrete lesion there is some misregistration of PET and CT data at this level. No additional area of abnormality is noted in the neck.) Incidental CT findings: none CHEST: Dominant abnormality in the LEFT chest corresponds to the LEFT lower lobe/infrahilar mass (image 100, series 3) this measures approximately 4.3 x 3.0 cm (SUVmax = 12.2) this includes infrahilar nodal tissue with enlargement. The Signs of AP window, LEFT subcarinal and subcarinal as well as RIGHT paratracheal nodal involvement with hypermetabolic lymph nodes. High RIGHT paratracheal lymph node (image 63, series 3) 11 mm (SUVmax = 5.9) Intense hypermetabolic changes in the LEFT AP window. Small lymph nodes noted in this area on the previous CT (SUVmax = 8.3) Enlarged subcarinal lymph node as seen on the recent CT (SUVmax = 7.3) (image 92, series 3). Nodal tissue posterior to the LEFT mainstem bronchus (image 93, series 3) (SUVmax = 6.2) Increased metabolic activity associated with pre-vascular lymph nodes as well. Incidental CT findings: Calcified atheromatous changes minimal in the thoracic aorta. No pericardial effusion. No axillary adenopathy. Basilar atelectasis. ABDOMEN/PELVIS: No abnormal hypermetabolic activity within the liver, pancreas, adrenal glands, or spleen. No hypermetabolic lymph nodes in the abdomen or pelvis. Incidental CT findings: Atheromatous plaque in the abdominal aorta without aneurysmal dilation. RIGHT renal cysts. No hydronephrosis. No acute bowel process. SKELETON: No focal hypermetabolic activity to suggest skeletal metastasis. Incidental  CT findings: none IMPRESSION: 1. Findings of hypermetabolic mass in the LEFT lower lobe with mediastinal adenopathy and high RIGHT paratracheal adenopathy suspicious bronchogenic neoplasm with nodal disease as described. 2. No areas of hypermetabolic change in the abdomen or pelvis to suggest metastatic disease. 3. Asymmetric fullness along the lateral wall of the LEFT oropharynx and palatine tonsil. This is of uncertain significance as there is misregistration of PET data in this area. Consider direct visualization or CT of the neck for further evaluation. 4. Relative decreased PET  activity at the level of the mid neck limits assessment of this area, related to artifact during acquisition likely related to attenuation correction and arm position. Aortic Atherosclerosis (ICD10-I70.0). Electronically Signed   By: Zetta Bills M.D.   On: 09/16/2019 15:52     Assessment and plan- Patient is a 69 y.o. 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  Patient has evidence of ipsilateral and contralateral mediastinal adenopathy making this is stage IIIb disease.  Pathology was consistent with adenocarcinoma.  This is unresectable and I would recommend nonsurgical management with concurrent chemoradiation with weekly carbotaxol chemotherapy followed by repeat scans and maintenance immunotherapy if he has stable disease.  He did quit smoking back in 1994 and we will also send off NGS testing to see if he has any targetable mutations which could be considered instead of an immunotherapy upon completion of chemoradiation.  I plan to give him weekly carbotaxol AUC 2 carboplatin along with Taxol at 80 mg per metered squared for [redacted] weeks along with radiation.  Discussed risks and benefits of chemotherapy including all but not limited to nausea, vomiting,  low blood counts, risk of infections and hospitalizations.  Risk of infusion reaction peripheral neuropathy associated with Taxol.  Treatment will be given with a curative intent.  We will plan to get port placement next week followed by chemotherapy teach.  Patient will be seeing Dr. Donella Stade next week and hopefully will start radiation in 2 weeks time.  I will tentatively see him back in 2-1/2 weeks to start first cycle of chemotherapy with CBC with differential, CMP.  We will also obtain hepatitis testing  Patient could not obtain MRI head despite giving 0.5 mg of Ativan.  I will get CT head with and without contrast to rule out brain metastases  Cancer Staging Malignant neoplasm of lower lobe of left lung [facility]) Staging form: Lung, AJCC 8th Edition - Clinical stage from 10/04/2019: Stage IIIB (cT2b, cN3, cM0) - Signed by Sindy Guadeloupe, MD on 10/04/2019     Visit Diagnosis 1. Goals of care, counseling/discussion   2. Malignant neoplasm of lower lobe of left lung ([place])      Dr. Randa Evens, MD, MPH [facility] at [facility] [phone] 10/04/2019 12:59 PM

## 2019-10-04 NOTE — H&P (View-Only) (Signed)
START ON PATHWAY REGIMEN - Non-Small Cell Lung     Administer weekly:     Paclitaxel      Carboplatin   **Always confirm dose/schedule in your pharmacy ordering system**  Patient Characteristics: Preoperative or Nonsurgical Candidate (Clinical Staging), [name] - Nonsurgical Candidate (Nonsquamous and Squamous), PS = 0, 1 Therapeutic Status: Preoperative or Nonsurgical Candidate (Clinical Staging) AJCC T Category: cT2b AJCC N Category: cN3 AJCC M Category: cM0 AJCC 8 Stage Grouping: IIIB ECOG Performance Status: 1 Intent of Therapy: Curative Intent, Discussed with Patient

## 2019-10-04 NOTE — Progress Notes (Signed)
START ON PATHWAY REGIMEN - Non-Small Cell Lung     Administer weekly:     Paclitaxel      Carboplatin   **Always confirm dose/schedule in your pharmacy ordering system**  Patient Characteristics: Preoperative or Nonsurgical Candidate (Clinical Staging), [name] - Nonsurgical Candidate (Nonsquamous and Squamous), PS = 0, 1 Therapeutic Status: Preoperative or Nonsurgical Candidate (Clinical Staging) AJCC T Category: cT2b AJCC N Category: cN3 AJCC M Category: cM0 AJCC 8 Stage Grouping: IIIB ECOG Performance Status: 1 Intent of Therapy: Curative Intent, Discussed with Patient

## 2019-10-04 NOTE — H&P (View-Only) (Signed)
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  He underwent CT angios chest which did not show any PE.  Soft tissue attenuation measuring 2.9 x 4 cm centered in the left hilum resulting in abrupt angulation and narrowing of the pulmonary arteries and the central left lower lobe airways.  Additional ipsilateral hilar and subcarinal adenopathy.  No contralateral adenopathy.  Consolidative masslike opacity 3.6 x 3.1 cm in size and contiguous with more central perihilar soft tissue attenuation.  Overall findings concerning for primary bronchogenic carcinoma.  Patient referred for further management.  Patient was discharged on oral Augmentin  Patient lives with his sister who is his main caregiver.  He does not drive but is independent of his ADLs.  Reports ongoing fatigue and occasional retrosternal chest pain.  He has been evaluated by GI in the past for reflux as well.  Appetie is fair and weight is stable.  Denies any significant shortness of breath at this time.  Patient is an ex-smoker and smoked for about 20 years but quit smoking back in 1994  PET CT scan showed hypermetabolic mass in the left lower lobe with mediastinal adenopathy and right paratracheal adenopathy.  No evidence of distant metastatic disease.  Pathology was consistent with non-small cell lung  cancer favor adenocarcinoma.  Cells were positive for TTF-1 and negative for p40.  Interval history-patient reports ongoing fatigue but denies other complaints at this time denies any pain.  Appetite and weight have remained stable he could not get his MRI done as he still felt claustrophobic despite taking 1.5 mg of Ativan prior to MRI  ECOG PS-  Pain scale- 1   Review of systems- Review of Systems  Constitutional: Positive for malaise/fatigue. Negative for chills, fever and weight loss.  HENT: Negative for congestion, ear discharge and nosebleeds.   Eyes: Negative for blurred vision.  Respiratory: Negative for cough, hemoptysis, sputum production, shortness of breath and wheezing.   Cardiovascular: Negative for chest pain, palpitations, orthopnea and claudication.  Gastrointestinal: Negative for abdominal pain, blood in stool, constipation, diarrhea, heartburn, melena, nausea and vomiting.  Genitourinary: Negative for dysuria, flank pain, frequency, hematuria and urgency.  Musculoskeletal: Negative for back pain, joint pain and myalgias.  Skin: Negative for rash.  Neurological: Negative for dizziness, tingling, focal weakness, seizures, weakness and headaches.  Endo/Heme/Allergies: Does not bruise/bleed easily.  Psychiatric/Behavioral: Negative for depression and suicidal ideas. The patient does not have insomnia.       No Known Allergies   Past Medical History:  Diagnosis Date  . Dyspnea   . Hyperlipidemia   . Hypertension      Past Surgical History:  Procedure Laterality Date  . VIDEO BRONCHOSCOPY WITH ENDOBRONCHIAL ULTRASOUND N/A 09/25/2019   Procedure: VIDEO BRONCHOSCOPY WITH ENDOBRONCHIAL ULTRASOUND;  Surgeon: Tyler Pita, MD;  Location: [facility] ORS;  Service: Pulmonary;  Laterality: N/A;    Social History   Socioeconomic History  .  Marital status: Single    Spouse name: Not on file  . Number of children: Not on file  . Years of education: Not on file  .  Highest education level: Not on file  Occupational History  . Not on file  Tobacco Use  . Smoking status: Former Smoker    Packs/day: 1.00    Years: 20.00    Pack years: 20.00    Types: Cigarettes    Quit date: 1994    Years since quitting: 27.6  . Smokeless tobacco: Never Used  Vaping Use  . Vaping Use: Never used  Substance and Sexual Activity  . Alcohol use: Yes    Comment: occ  . Drug use: Never  . Sexual activity: Not on file  Other Topics Concern  . Not on file  Social History Narrative  . Not on file   Social Determinants of Health   Financial Resource Strain:   . Difficulty of Paying Living Expenses: Not on file  Food Insecurity:   . Worried About Charity fundraiser in the Last Year: Not on file  . Ran Out of Food in the Last Year: Not on file  Transportation Needs:   . Lack of Transportation (Medical): Not on file  . Lack of Transportation (Non-Medical): Not on file  Physical Activity:   . Days of Exercise per Week: Not on file  . Minutes of Exercise per Session: Not on file  Stress:   . Feeling of Stress : Not on file  Social Connections:   . Frequency of Communication with Friends and Family: Not on file  . Frequency of Social Gatherings with Friends and Family: Not on file  . Attends Religious Services: Not on file  . Active Member of Clubs or Organizations: Not on file  . Attends Archivist Meetings: Not on file  . Marital Status: Not on file  Intimate Partner Violence:   . Fear of Current or Ex-Partner: Not on file  . Emotionally Abused: Not on file  . Physically Abused: Not on file  . Sexually Abused: Not on file    Family History  Problem Relation Age of Onset  . Cancer Brother      Current Outpatient Medications:  .  benzonatate (TESSALON PERLES) 100 MG capsule, Take 1 capsule (100 mg total) by mouth every 6 (six) hours as needed for cough., Disp: 30 capsule, Rfl: 0 .  gabapentin (NEURONTIN) 100 MG capsule, Take 1 capsule (100  mg total) by mouth 3 (three) times daily., Disp: 90 capsule, Rfl: 0 .  pantoprazole (PROTONIX) 40 MG tablet, Take 1 tablet (40 mg total) by mouth daily., Disp: 30 tablet, Rfl: 3 .  acetaminophen (TYLENOL) 500 MG tablet, Take 1,000 mg by mouth every 6 (six) hours as needed for mild pain, moderate pain or headache., Disp: , Rfl:   Physical exam:  Vitals:   10/04/19 1047  BP: 121/85  Pulse: 78  Resp: 18  Temp: (!) 96.8 F (36 C)  TempSrc: Tympanic  SpO2: 99%  Weight: 186 lb 11.2 oz (84.7 kg)   Physical Exam Constitutional:      General: He is not in acute distress. Cardiovascular:     Rate and Rhythm: Normal rate and regular rhythm.     Heart sounds: Normal heart sounds.  Pulmonary:     Effort: Pulmonary effort is normal.     Breath sounds: Normal breath sounds.  Abdominal:     General: Bowel sounds are normal.  Palpations: Abdomen is soft.  Skin:    General: Skin is warm and dry.  Neurological:     Mental Status: He is alert and oriented to person, place, and time.      CMP Latest Ref Rng & Units 09/02/2019  Glucose 70 - 99 mg/dL 140(H)  BUN 8 - 23 mg/dL 16  Creatinine 0.61 - 1.24 mg/dL 1.05  Sodium 135 - 145 mmol/L 137  Potassium 3.5 - 5.1 mmol/L 3.6  Chloride 98 - 111 mmol/L 104  CO2 22 - 32 mmol/L 22  Calcium 8.9 - 10.3 mg/dL 9.2   CBC Latest Ref Rng & Units 09/02/2019  WBC 4.0 - 10.5 K/uL 10.2  Hemoglobin 13.0 - 17.0 g/dL 11.7(L)  Hematocrit 39 - 52 % 36.5(L)  Platelets 150 - 400 K/uL 388    No images are attached to the encounter.  NM PET Image Initial (PI) Skull Base To Thigh  Result Date: 09/16/2019 CLINICAL DATA:  Initial treatment strategy for pulmonary masses and adenopathy. EXAM: NUCLEAR MEDICINE PET SKULL BASE TO THIGH TECHNIQUE: 9.818 mCi F-18 FDG was injected intravenously. Full-ring PET imaging was performed from the skull base to thigh after the radiotracer. CT data was obtained and used for attenuation correction and anatomic localization.  Fasting blood glucose: 82 mg/dl COMPARISON:  CT chest of September 03, 2019 FINDINGS: Mediastinal blood pool activity: SUV max 1.96 Liver activity: SUV max NA NECK: Asymmetric activity in the LEFT oropharynx with some mild asymmetric fullness (image 28, series 3) this measures approximately 7 mm thickness (SUVmax = 5.5 amorphous uptake lateral to this area along pterygoid musculature without discrete lesion there is some misregistration of PET and CT data at this level. No additional area of abnormality is noted in the neck.) Incidental CT findings: none CHEST: Dominant abnormality in the LEFT chest corresponds to the LEFT lower lobe/infrahilar mass (image 100, series 3) this measures approximately 4.3 x 3.0 cm (SUVmax = 12.2) this includes infrahilar nodal tissue with enlargement. The Signs of AP window, LEFT subcarinal and subcarinal as well as RIGHT paratracheal nodal involvement with hypermetabolic lymph nodes. High RIGHT paratracheal lymph node (image 63, series 3) 11 mm (SUVmax = 5.9) Intense hypermetabolic changes in the LEFT AP window. Small lymph nodes noted in this area on the previous CT (SUVmax = 8.3) Enlarged subcarinal lymph node as seen on the recent CT (SUVmax = 7.3) (image 92, series 3). Nodal tissue posterior to the LEFT mainstem bronchus (image 93, series 3) (SUVmax = 6.2) Increased metabolic activity associated with pre-vascular lymph nodes as well. Incidental CT findings: Calcified atheromatous changes minimal in the thoracic aorta. No pericardial effusion. No axillary adenopathy. Basilar atelectasis. ABDOMEN/PELVIS: No abnormal hypermetabolic activity within the liver, pancreas, adrenal glands, or spleen. No hypermetabolic lymph nodes in the abdomen or pelvis. Incidental CT findings: Atheromatous plaque in the abdominal aorta without aneurysmal dilation. RIGHT renal cysts. No hydronephrosis. No acute bowel process. SKELETON: No focal hypermetabolic activity to suggest skeletal metastasis. Incidental  CT findings: none IMPRESSION: 1. Findings of hypermetabolic mass in the LEFT lower lobe with mediastinal adenopathy and high RIGHT paratracheal adenopathy suspicious bronchogenic neoplasm with nodal disease as described. 2. No areas of hypermetabolic change in the abdomen or pelvis to suggest metastatic disease. 3. Asymmetric fullness along the lateral wall of the LEFT oropharynx and palatine tonsil. This is of uncertain significance as there is misregistration of PET data in this area. Consider direct visualization or CT of the neck for further evaluation. 4. Relative decreased PET  activity at the level of the mid neck limits assessment of this area, related to artifact during acquisition likely related to attenuation correction and arm position. Aortic Atherosclerosis (ICD10-I70.0). Electronically Signed   By: Zetta Bills M.D.   On: 09/16/2019 15:52     Assessment and plan- Patient is a 69 y.o. 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  Patient has evidence of ipsilateral and contralateral mediastinal adenopathy making this is stage IIIb disease.  Pathology was consistent with adenocarcinoma.  This is unresectable and I would recommend nonsurgical management with concurrent chemoradiation with weekly carbotaxol chemotherapy followed by repeat scans and maintenance immunotherapy if he has stable disease.  He did quit smoking back in 1994 and we will also send off NGS testing to see if he has any targetable mutations which could be considered instead of an immunotherapy upon completion of chemoradiation.  I plan to give him weekly carbotaxol AUC 2 carboplatin along with Taxol at 80 mg per metered squared for [redacted] weeks along with radiation.  Discussed risks and benefits of chemotherapy including all but not limited to nausea, vomiting,  low blood counts, risk of infections and hospitalizations.  Risk of infusion reaction peripheral neuropathy associated with Taxol.  Treatment will be given with a curative intent.  We will plan to get port placement next week followed by chemotherapy teach.  Patient will be seeing Dr. Donella Stade next week and hopefully will start radiation in 2 weeks time.  I will tentatively see him back in 2-1/2 weeks to start first cycle of chemotherapy with CBC with differential, CMP.  We will also obtain hepatitis testing  Patient could not obtain MRI head despite giving 0.5 mg of Ativan.  I will get CT head with and without contrast to rule out brain metastases  Cancer Staging Malignant neoplasm of lower lobe of left lung [facility]) Staging form: Lung, AJCC 8th Edition - Clinical stage from 10/04/2019: Stage IIIB (cT2b, cN3, cM0) - Signed by Sindy Guadeloupe, MD on 10/04/2019     Visit Diagnosis 1. Goals of care, counseling/discussion   2. Malignant neoplasm of lower lobe of left lung ([place])      Dr. Randa Evens, MD, MPH [facility] [phone] 10/04/2019 12:59 PM

## 2019-10-07 ENCOUNTER — Ambulatory Visit
Admission: RE | Admit: 2019-10-07 | Discharge: 2019-10-07 | Disposition: A | Discharge status: Home / Self Care | Payer: Medicaid Other | Source: Ambulatory Visit | Attending: Radiation Oncology | Admitting: Radiation Oncology

## 2019-10-07 ENCOUNTER — Encounter: Payer: Self-pay | Admitting: Radiation Oncology

## 2019-10-07 ENCOUNTER — Other Ambulatory Visit: Payer: Self-pay

## 2019-10-07 ENCOUNTER — Encounter: Payer: Self-pay | Admitting: *Deleted

## 2019-10-07 VITALS — BP 150/87 | HR 78 | Temp 97.1°F | Wt 187.7 lb

## 2019-10-07 DIAGNOSIS — R634 Abnormal weight loss: Secondary | ICD-10-CM | POA: Diagnosis not present

## 2019-10-07 DIAGNOSIS — R599 Enlarged lymph nodes, unspecified: Secondary | ICD-10-CM | POA: Insufficient documentation

## 2019-10-07 DIAGNOSIS — E785 Hyperlipidemia, unspecified: Secondary | ICD-10-CM | POA: Insufficient documentation

## 2019-10-07 DIAGNOSIS — C3432 Malignant neoplasm of lower lobe, left bronchus or lung: Secondary | ICD-10-CM | POA: Insufficient documentation

## 2019-10-07 DIAGNOSIS — Z87891 Personal history of nicotine dependence: Secondary | ICD-10-CM | POA: Diagnosis not present

## 2019-10-07 DIAGNOSIS — Z79899 Other long term (current) drug therapy: Secondary | ICD-10-CM | POA: Diagnosis not present

## 2019-10-07 DIAGNOSIS — I10 Essential (primary) hypertension: Secondary | ICD-10-CM | POA: Insufficient documentation

## 2019-10-07 DIAGNOSIS — R630 Anorexia: Secondary | ICD-10-CM | POA: Insufficient documentation

## 2019-10-07 NOTE — Progress Notes (Signed)
  Oncology Nurse Navigator Documentation  Navigator Location: [facility] (10/07/19 1500)   )Navigator Encounter Type: Initial RadOnc (10/07/19 1500)     Confirmed Diagnosis Date: 09/27/19 (10/07/19 1500)                 Treatment Phase: Pre-Tx/Tx Discussion (10/07/19 1500) Barriers/Navigation Needs: Coordination of Care;Education (10/07/19 1500) Education: Newly Diagnosed Cancer Education;Understanding Cancer/ Treatment Options (10/07/19 1500) Interventions: Coordination of Care;Education (10/07/19 1500)   Coordination of Care: Appts;Chemo (10/07/19 1500) Education Method: Written (10/07/19 1500)         met with patient and his sister during initial consult with Dr. Baruch Gouty to discuss radiation treatment. All questions answered during visit. Reviewed upcoming appts in detail including pre-procedure instructions for port placement on 10/22/19. Resources given to sister regarding diagnosis and supportive services available. Nothing further needed at this time. Instructed to call with any further questions or needs. Pt and his sister verbalized understanding.       Time Spent with Patient: 60 (10/07/19 1500)

## 2019-10-07 NOTE — Addendum Note (Signed)
Addended by: Luella Cook on: 10/07/2019 08:00 PM   Modules accepted: Orders

## 2019-10-07 NOTE — Consult Note (Signed)
NEW PATIENT EVALUATION  Name: Mitchell Frye  MRN: [id number]  Date:   10/07/2019     DOB: [birth year]   This 69 y.o. male patient presents to the clinic for initial evaluation of stage IIIb (.  T2 N3 M0) non-small cell lung cancer of the left hilum presumed to be adenocarcinoma  REFERRING PHYSICIAN: Center, [place]:  Chief Complaint  Patient presents with  . Lung Cancer    Initial consultation    DIAGNOSIS: The encounter diagnosis was Malignant neoplasm of lower lobe of left lung ([place]).   PREVIOUS INVESTIGATIONS:  Pathology report reviewed CT scans PET CT scans reviewed Clinical notes reviewed  HPI: Patient is a 69 year old male who presented to the emergency room demonstration with symptoms of increasing cough slight hemoptysis and shortness of breath.  CT scan of the chest demonstrated a 2.9 x 4.0 cm left hilar mass with abrupt truncation and narrowing the pulmonary arteries and central airway of the left lower lobe.  Patient also had ipsilateral hilar and subcarinal adenopathy concerning for primary bronchogenic carcinoma.  Patient underwent PET CT scan showing hypermetabolic activity in the left lower lobe with mediastinal adenopathy high right paratracheal adenopathy all suspicious for bronchogenic carcinoma.  Patient underwent bronchoscopy with pathology positive for non-small cell lung cancer favoring adenocarcinoma.  He has been seen by medical oncology with recommendation for concurrent chemoradiation.  Patient has lost weight has anorexia is quite fatigued.  He still has moderate cough no significant hemoptysis at this time.  He is now referred to radiation oncology for opinion.  He is accompanied by his sister today  PLANNED TREATMENT REGIMEN: Concurrent chemoradiation  PAST MEDICAL HISTORY:  has a past medical history of Dyspnea, Hyperlipidemia, and Hypertension.    PAST SURGICAL HISTORY:  Past Surgical History:  Procedure Laterality Date  . VIDEO  BRONCHOSCOPY WITH ENDOBRONCHIAL ULTRASOUND N/A 09/25/2019   Procedure: VIDEO BRONCHOSCOPY WITH ENDOBRONCHIAL ULTRASOUND;  Surgeon: Tyler Pita, MD;  Location: [facility] ORS;  Service: Pulmonary;  Laterality: N/A;    FAMILY HISTORY: family history includes Cancer in his brother.  SOCIAL HISTORY:  reports that he quit smoking about 27 years ago. His smoking use included cigarettes. He has a 20.00 pack-year smoking history. He has never used smokeless tobacco. He reports current alcohol use. He reports that he does not use drugs.  ALLERGIES: Patient has no known allergies.  MEDICATIONS:  Current Outpatient Medications  Medication Sig Dispense Refill  . acetaminophen (TYLENOL) 500 MG tablet Take 1,000 mg by mouth every 6 (six) hours as needed for mild pain, moderate pain or headache.    . albuterol (VENTOLIN HFA) 108 (90 Base) MCG/ACT inhaler Inhale 2 puffs into the lungs every 4 (four) hours as needed.    Jearl Klinefelter ELLIPTA 62.5-25 MCG/INH AEPB Inhale into the lungs.    . benzonatate (TESSALON PERLES) 100 MG capsule Take 1 capsule (100 mg total) by mouth every 6 (six) hours as needed for cough. 90 capsule 0  . dexamethasone (DECADRON) 4 MG tablet Take 2 tablets (8 mg total) by mouth daily. Start the day after chemotherapy for 2 days. 30 tablet 1  . gabapentin (NEURONTIN) 100 MG capsule Take 1 capsule (100 mg total) by mouth 3 (three) times daily. 90 capsule 0  . lidocaine-prilocaine (EMLA) cream Apply to affected area once 30 g 3  . LORazepam (ATIVAN) 0.5 MG tablet Take 1 tablet (0.5 mg total) by mouth every 6 (six) hours as needed (Nausea or vomiting). 30 tablet 0  .  omeprazole (PRILOSEC) 20 MG capsule Take 20 mg by mouth daily.    . ondansetron (ZOFRAN) 8 MG tablet Take 1 tablet (8 mg total) by mouth 2 (two) times daily as needed for refractory nausea / vomiting. Start on day 3 after chemo. 30 tablet 1  . pantoprazole (PROTONIX) 40 MG tablet Take 1 tablet (40 mg total) by mouth daily. 30 tablet 3   . prochlorperazine (COMPAZINE) 10 MG tablet Take 1 tablet (10 mg total) by mouth every 6 (six) hours as needed (Nausea or vomiting). 30 tablet 1  . traMADol (ULTRAM) 50 MG tablet Take 1 tablet (50 mg total) by mouth every 6 (six) hours as needed. 120 tablet 0   No current facility-administered medications for this encounter.    ECOG PERFORMANCE STATUS:  1 - Symptomatic but completely ambulatory  REVIEW OF SYSTEMS: Except for the trace hemoptysis cough does have some pain around the umbilical hernia Patient denies any weight loss, fatigue, weakness, fever, chills or night sweats. Patient denies any loss of vision, blurred vision. Patient denies any ringing  of the ears or hearing loss. No irregular heartbeat. Patient denies heart murmur or history of fainting. Patient denies any chest pain or pain radiating to her upper extremities. Patient denies any shortness of breath, difficulty breathing at night, cough or hemoptysis. Patient denies any swelling in the lower legs. Patient denies any nausea vomiting, vomiting of blood, or coffee ground material in the vomitus. Patient denies any stomach pain. Patient states has had normal bowel movements no significant constipation or diarrhea. Patient denies any dysuria, hematuria or significant nocturia. Patient denies any problems walking, swelling in the joints or loss of balance. Patient denies any skin changes, loss of hair or loss of weight. Patient denies any excessive worrying or anxiety or significant depression. Patient denies any problems with insomnia. Patient denies excessive thirst, polyuria, polydipsia. Patient denies any swollen glands, patient denies easy bruising or easy bleeding. Patient denies any recent infections, allergies or URI. Patient "s visual fields have not changed significantly in recent time.   PHYSICAL EXAM: BP (!) 150/87   Pulse 78   Temp (!) 97.1 F (36.2 C) (Tympanic)   Wt 187 lb 11.2 oz (85.1 kg)   SpO2 96% Comment: room  air  BMI 26.93 kg/m  Well-developed well-nourished patient in NAD. HEENT reveals PERLA, EOMI, discs not visualized.  Oral cavity is clear. No oral mucosal lesions are identified. Neck is clear without evidence of cervical or supraclavicular adenopathy. Lungs are clear to A&P. Cardiac examination is essentially unremarkable with regular rate and rhythm without murmur rub or thrill. Abdomen is benign with no organomegaly or masses noted. Motor sensory and DTR levels are equal and symmetric in the upper and lower extremities. Cranial nerves II through XII are grossly intact. Proprioception is intact. No peripheral adenopathy or edema is identified. No motor or sensory levels are noted. Crude visual fields are within normal range.  LABORATORY DATA: Pathology report reviewed    RADIOLOGY RESULTS: CT scan and PET CT scan reviewed compatible with above-stated findings   IMPRESSION: Stage IIIb adenocarcinoma of the left lung in 69 year old male  PLAN: At this time like to go ahead with concurrent chemoradiation.  I would plan on delivering 7000 cGy to his left lung mass.  Also will include his subcarinal and contralateral right paratracheal lymph node in my treatment planning.  I would use IMRT treatment planning and delivery to spare critical structures such as his esophagus heart spinal cord.  I would also differentiate my dose treating his subcarinal and right paratracheal lymph nodes to 60 Gy over the 7 weeks reserving the 7000 cGy area for the primary left hilar mass.  Risks and benefits of treatment including increasing cough possible radiation esophagitis causing dysphagia fatigue alteration of blood count skin reaction loss of normal lung volume all were described in detail to the patient and his sister.  There will be extra effort by both professional staff as well as technical staff to coordinate and manage concurrent chemoradiation and ensuing side effects during his treatments. I have personally  set up and ordered CT simulation for later this week.  He is scheduled to have a port placed.  I would like to take this opportunity to thank you for allowing me to participate in the care of your patient.Noreene Filbert, MD

## 2019-10-08 ENCOUNTER — Telehealth: Payer: Self-pay | Admitting: *Deleted

## 2019-10-08 NOTE — Telephone Encounter (Signed)
Contacted patient's sister Margaretha Sheffield for Procedure of port placement. Patient will arrive at medical mall at [facility] to  the admitting and registration desk to check in on 9/7 date and 1pm time.  Patient was instructed to be NPO after midnight the night before procedure and to have a driver to be able to take them home.  If there are any medications to take that am please take with sip of water.  Patient understands instructions and if they have questions can call back to cancer center. Margaretha Sheffield is agreeable. Also she will need a covid test on 9/3 in medical arts building and it needs to be drive thru between 8:46 to 12:30. Sister has already took him there once before and knows how to get there.

## 2019-10-09 NOTE — Patient Instructions (Signed)
Paclitaxel injection What is this medicine? PACLITAXEL (PAK li TAX el) is a chemotherapy drug. It targets fast dividing cells, like cancer cells, and causes these cells to die. This medicine is used to treat ovarian cancer, breast cancer, lung cancer, Kaposi's sarcoma, and other cancers. This medicine may be used for other purposes; ask your health care provider or pharmacist if you have questions. COMMON BRAND NAME(S): Onxol, Taxol What should I tell my health care provider before I take this medicine? They need to know if you have any of these conditions:  history of irregular heartbeat  liver disease  low blood counts, like low white cell, platelet, or red cell counts  lung or breathing disease, like asthma  tingling of the fingers or toes, or other nerve disorder  an unusual or allergic reaction to paclitaxel, alcohol, polyoxyethylated castor oil, other chemotherapy, other medicines, foods, dyes, or preservatives  pregnant or trying to get pregnant  breast-feeding How should I use this medicine? This drug is given as an infusion into a vein. It is administered in a hospital or clinic by a specially trained health care professional. Talk to your pediatrician regarding the use of this medicine in children. Special care may be needed. Overdosage: If you think you have taken too much of this medicine contact a poison control center or emergency room at once. NOTE: This medicine is only for you. Do not share this medicine with others. What if I miss a dose? It is important not to miss your dose. Call your doctor or health care professional if you are unable to keep an appointment. What may interact with this medicine? Do not take this medicine with any of the following medications:  disulfiram  metronidazole This medicine may also interact with the following medications:  antiviral medicines for hepatitis, HIV or AIDS  certain antibiotics like erythromycin and  clarithromycin  certain medicines for fungal infections like ketoconazole and itraconazole  certain medicines for seizures like carbamazepine, phenobarbital, phenytoin  gemfibrozil  nefazodone  rifampin  St. John's wort This list may not describe all possible interactions. Give your health care provider a list of all the medicines, herbs, non-prescription drugs, or dietary supplements you use. Also tell them if you smoke, drink alcohol, or use illegal drugs. Some items may interact with your medicine. What should I watch for while using this medicine? Your condition will be monitored carefully while you are receiving this medicine. You will need important blood work done while you are taking this medicine. This medicine can cause serious allergic reactions. To reduce your risk you will need to take other medicine(s) before treatment with this medicine. If you experience allergic reactions like skin rash, itching or hives, swelling of the face, lips, or tongue, tell your doctor or health care professional right away. In some cases, you may be given additional medicines to help with side effects. Follow all directions for their use. This drug may make you feel generally unwell. This is not uncommon, as chemotherapy can affect healthy cells as well as cancer cells. Report any side effects. Continue your course of treatment even though you feel ill unless your doctor tells you to stop. Call your doctor or health care professional for advice if you get a fever, chills or sore throat, or other symptoms of a cold or flu. Do not treat yourself. This drug decreases your body's ability to fight infections. Try to avoid being around people who are sick. This medicine may increase your risk to bruise  or bleed. Call your doctor or health care professional if you notice any unusual bleeding. Be careful brushing and flossing your teeth or using a toothpick because you may get an infection or bleed more easily.  If you have any dental work done, tell your dentist you are receiving this medicine. Avoid taking products that contain aspirin, acetaminophen, ibuprofen, naproxen, or ketoprofen unless instructed by your doctor. These medicines may hide a fever. Do not become pregnant while taking this medicine. Women should inform their doctor if they wish to become pregnant or think they might be pregnant. There is a potential for serious side effects to an unborn child. Talk to your health care professional or pharmacist for more information. Do not breast-feed an infant while taking this medicine. Men are advised not to father a child while receiving this medicine. This product may contain alcohol. Ask your pharmacist or healthcare provider if this medicine contains alcohol. Be sure to tell all healthcare providers you are taking this medicine. Certain medicines, like metronidazole and disulfiram, can cause an unpleasant reaction when taken with alcohol. The reaction includes flushing, headache, nausea, vomiting, sweating, and increased thirst. The reaction can last from 30 minutes to several hours. What side effects may I notice from receiving this medicine? Side effects that you should report to your doctor or health care professional as soon as possible:  allergic reactions like skin rash, itching or hives, swelling of the face, lips, or tongue  breathing problems  changes in vision  fast, irregular heartbeat  high or low blood pressure  mouth sores  pain, tingling, numbness in the hands or feet  signs of decreased platelets or bleeding - bruising, pinpoint red spots on the skin, black, tarry stools, blood in the urine  signs of decreased red blood cells - unusually weak or tired, feeling faint or lightheaded, falls  signs of infection - fever or chills, cough, sore throat, pain or difficulty passing urine  signs and symptoms of liver injury like dark yellow or brown urine; general ill feeling or  flu-like symptoms; light-colored stools; loss of appetite; nausea; right upper belly pain; unusually weak or tired; yellowing of the eyes or skin  swelling of the ankles, feet, hands  unusually slow heartbeat Side effects that usually do not require medical attention (report to your doctor or health care professional if they continue or are bothersome):  diarrhea  hair loss  loss of appetite  muscle or joint pain  nausea, vomiting  pain, redness, or irritation at site where injected  tiredness This list may not describe all possible side effects. Call your doctor for medical advice about side effects. You may report side effects to FDA at 1-800-FDA-1088. Where should I keep my medicine? This drug is given in a hospital or clinic and will not be stored at home. NOTE: This sheet is a summary. It may not cover all possible information. If you have questions about this medicine, talk to your doctor, pharmacist, or health care provider.  2020 [employer] Standard (2016-10-04 13:14:55) Carboplatin injection What is this medicine? CARBOPLATIN (KAR boe pla tin) is a chemotherapy drug. It targets fast dividing cells, like cancer cells, and causes these cells to die. This medicine is used to treat ovarian cancer and many other cancers. This medicine may be used for other purposes; ask your health care provider or pharmacist if you have questions. COMMON BRAND NAME(S): Paraplatin What should I tell my health care provider before I take this medicine? They need to  know if you have any of these conditions:  blood disorders  hearing problems  kidney disease  recent or ongoing radiation therapy  an unusual or allergic reaction to carboplatin, cisplatin, other chemotherapy, other medicines, foods, dyes, or preservatives  pregnant or trying to get pregnant  breast-feeding How should I use this medicine? This drug is usually given as an infusion into a vein. It is administered in a  hospital or clinic by a specially trained health care professional. Talk to your pediatrician regarding the use of this medicine in children. Special care may be needed. Overdosage: If you think you have taken too much of this medicine contact a poison control center or emergency room at once. NOTE: This medicine is only for you. Do not share this medicine with others. What if I miss a dose? It is important not to miss a dose. Call your doctor or health care professional if you are unable to keep an appointment. What may interact with this medicine?  medicines for seizures  medicines to increase blood counts like filgrastim, pegfilgrastim, sargramostim  some antibiotics like amikacin, gentamicin, neomycin, streptomycin, tobramycin  vaccines Talk to your doctor or health care professional before taking any of these medicines:  acetaminophen  aspirin  ibuprofen  ketoprofen  naproxen This list may not describe all possible interactions. Give your health care provider a list of all the medicines, herbs, non-prescription drugs, or dietary supplements you use. Also tell them if you smoke, drink alcohol, or use illegal drugs. Some items may interact with your medicine. What should I watch for while using this medicine? Your condition will be monitored carefully while you are receiving this medicine. You will need important blood work done while you are taking this medicine. This drug may make you feel generally unwell. This is not uncommon, as chemotherapy can affect healthy cells as well as cancer cells. Report any side effects. Continue your course of treatment even though you feel ill unless your doctor tells you to stop. In some cases, you may be given additional medicines to help with side effects. Follow all directions for their use. Call your doctor or health care professional for advice if you get a fever, chills or sore throat, or other symptoms of a cold or flu. Do not treat  yourself. This drug decreases your body's ability to fight infections. Try to avoid being around people who are sick. This medicine may increase your risk to bruise or bleed. Call your doctor or health care professional if you notice any unusual bleeding. Be careful brushing and flossing your teeth or using a toothpick because you may get an infection or bleed more easily. If you have any dental work done, tell your dentist you are receiving this medicine. Avoid taking products that contain aspirin, acetaminophen, ibuprofen, naproxen, or ketoprofen unless instructed by your doctor. These medicines may hide a fever. Do not become pregnant while taking this medicine. Women should inform their doctor if they wish to become pregnant or think they might be pregnant. There is a potential for serious side effects to an unborn child. Talk to your health care professional or pharmacist for more information. Do not breast-feed an infant while taking this medicine. What side effects may I notice from receiving this medicine? Side effects that you should report to your doctor or health care professional as soon as possible:  allergic reactions like skin rash, itching or hives, swelling of the face, lips, or tongue  signs of infection - fever or  chills, cough, sore throat, pain or difficulty passing urine  signs of decreased platelets or bleeding - bruising, pinpoint red spots on the skin, black, tarry stools, nosebleeds  signs of decreased red blood cells - unusually weak or tired, fainting spells, lightheadedness  breathing problems  changes in hearing  changes in vision  chest pain  high blood pressure  low blood counts - This drug may decrease the number of white blood cells, red blood cells and platelets. You may be at increased risk for infections and bleeding.  nausea and vomiting  pain, swelling, redness or irritation at the injection site  pain, tingling, numbness in the hands or  feet  problems with balance, talking, walking  trouble passing urine or change in the amount of urine Side effects that usually do not require medical attention (report to your doctor or health care professional if they continue or are bothersome):  hair loss  loss of appetite  metallic taste in the mouth or changes in taste This list may not describe all possible side effects. Call your doctor for medical advice about side effects. You may report side effects to FDA at 1-800-FDA-1088. Where should I keep my medicine? This drug is given in a hospital or clinic and will not be stored at home. NOTE: This sheet is a summary. It may not cover all possible information. If you have questions about this medicine, talk to your doctor, pharmacist, or health care provider.  2020 [employer] Standard (2007-05-08 14:38:05)

## 2019-10-10 ENCOUNTER — Ambulatory Visit
Admission: RE | Admit: 2019-10-10 | Discharge: 2019-10-10 | Disposition: A | Discharge status: Home / Self Care | Payer: Medicaid Other | Source: Ambulatory Visit | Attending: Oncology | Admitting: Oncology

## 2019-10-10 ENCOUNTER — Other Ambulatory Visit: Payer: Self-pay

## 2019-10-10 ENCOUNTER — Other Ambulatory Visit: Payer: Self-pay | Admitting: Pulmonary Disease

## 2019-10-10 ENCOUNTER — Inpatient Hospital Stay: Payer: Medicaid Other

## 2019-10-10 ENCOUNTER — Inpatient Hospital Stay (HOSPITAL_BASED_OUTPATIENT_CLINIC_OR_DEPARTMENT_OTHER): Payer: Medicaid Other | Admitting: Oncology

## 2019-10-10 ENCOUNTER — Ambulatory Visit
Admission: RE | Admit: 2019-10-10 | Discharge: 2019-10-10 | Disposition: A | Discharge status: Home / Self Care | Payer: Medicaid Other | Source: Ambulatory Visit | Attending: Radiation Oncology | Admitting: Radiation Oncology

## 2019-10-10 DIAGNOSIS — Z510 Encounter for antineoplastic radiation therapy: Secondary | ICD-10-CM | POA: Insufficient documentation

## 2019-10-10 DIAGNOSIS — C3432 Malignant neoplasm of lower lobe, left bronchus or lung: Secondary | ICD-10-CM | POA: Insufficient documentation

## 2019-10-10 LAB — POCT I-STAT CREATININE: Creatinine, Ser: 0.8 mg/dL (ref 0.61–1.24)

## 2019-10-10 MED ORDER — IOHEXOL 300 MG/ML  SOLN
75.0000 mL | Freq: Once | INTRAMUSCULAR | Status: AC | PRN
  Administered 2019-10-10: 75 mL via INTRAVENOUS

## 2019-10-10 NOTE — Progress Notes (Signed)
[place]  [phone] [phone]  Patient Care Team: Center, [facility] as PCP - Lillia Mountain, [place], RN as Oncology Nurse Navigator   Name of the patient: Mitchell Frye  [id number]  1950-03-07   Date of visit: 10/10/19  Diagnosis-lung cancer  Chief complaint/Reason for visit- Initial Meeting for [facility], preparing for starting chemotherapy  Heme/Onc history:  Oncology History  Malignant neoplasm of lower lobe of left lung ([place])  10/04/2019 Initial Diagnosis   Malignant neoplasm of lower lobe of left lung ([place])   10/04/2019 Cancer Staging   Staging form: Lung, AJCC 8th Edition - Clinical stage from 10/04/2019: Stage IIIB (cT2b, cN3, cM0) - Signed by Sindy Guadeloupe, MD on 10/04/2019   10/05/2019 -  Chemotherapy   The patient had dexamethasone (DECADRON) 4 MG tablet, 8 mg, Oral, Daily, 1 of 1 cycle, Start date: 10/04/2019, End date: -- PALONOSETRON HCL INJECTION 0.25 MG/5ML, 0.25 mg, Intravenous,  Once, 0 of 4 cycles CARBOplatin (PARAPLATIN) in sodium chloride 0.9 % 100 mL chemo infusion, , Intravenous,  Once, 0 of 4 cycles PACLitaxel (TAXOL) 90 mg in sodium chloride 0.9 % 250 mL chemo infusion (</= 80mg /m2), 45 mg/m2, Intravenous,  Once, 0 of 4 cycles  for chemotherapy treatment.      Interval history-[name] is a 69 year old male who presents to chemo care clinic today for initial meeting in preparation for starting chemotherapy. I introduced the chemo care clinic and we discussed that the role of the clinic is to assist those who are at an increased risk of emergency room visits and/or complications during the course of chemotherapy treatment. We discussed that the increased risk takes into account factors such as age, performance status, and co-morbidities. We also discussed that for some, this might include barriers to care such as not having a primary care provider, lack of  insurance/transportation, or not being able to afford medications. We discussed that the goal of the program is to help prevent unplanned ER visits and help reduce complications during chemotherapy. We do this by discussing specific risk factors to each individual and identifying ways that we can help improve these risk factors and reduce barriers to care.   ECOG FS:1 - Symptomatic but completely ambulatory  Review of systems- Review of Systems  Constitutional: Negative.  Negative for chills, fever, malaise/fatigue and weight loss.  HENT: Negative for congestion, ear pain and tinnitus.   Eyes: Negative.  Negative for blurred vision and double vision.  Respiratory: Negative.  Negative for cough, sputum production and shortness of breath.   Cardiovascular: Negative.  Negative for chest pain, palpitations and leg swelling.  Gastrointestinal: Negative.  Negative for abdominal pain, constipation, diarrhea, nausea and vomiting.  Genitourinary: Negative for dysuria, frequency and urgency.  Musculoskeletal: Negative for back pain and falls.  Skin: Negative.  Negative for rash.  Neurological: Negative.  Negative for weakness and headaches.  Endo/Heme/Allergies: Negative.  Does not bruise/bleed easily.  Psychiatric/Behavioral: Negative.  Negative for depression. The patient is not nervous/anxious and does not have insomnia.      Current treatment- carbo/taxol with XRT  No Known Allergies  Past Medical History:  Diagnosis Date  . Dyspnea   . Hyperlipidemia   . Hypertension     Past Surgical History:  Procedure Laterality Date  . VIDEO BRONCHOSCOPY WITH ENDOBRONCHIAL ULTRASOUND N/A 09/25/2019   Procedure: VIDEO BRONCHOSCOPY WITH ENDOBRONCHIAL ULTRASOUND;  Surgeon: Tyler Pita, MD;  Location: [facility] ORS;  Service: Pulmonary;  Laterality: N/A;    Social History   Socioeconomic History  . Marital status: Single    Spouse name: Not on file  . Number of children: Not on file  . Years of  education: Not on file  . Highest education level: Not on file  Occupational History  . Not on file  Tobacco Use  . Smoking status: Former Smoker    Packs/day: 1.00    Years: 20.00    Pack years: 20.00    Types: Cigarettes    Quit date: 1994    Years since quitting: 27.6  . Smokeless tobacco: Never Used  Vaping Use  . Vaping Use: Never used  Substance and Sexual Activity  . Alcohol use: Yes    Comment: occ  . Drug use: Never  . Sexual activity: Not on file  Other Topics Concern  . Not on file  Social History Narrative  . Not on file   Social Determinants of Health   Financial Resource Strain:   . Difficulty of Paying Living Expenses: Not on file  Food Insecurity:   . Worried About Charity fundraiser in the Last Year: Not on file  . Ran Out of Food in the Last Year: Not on file  Transportation Needs:   . Lack of Transportation (Medical): Not on file  . Lack of Transportation (Non-Medical): Not on file  Physical Activity:   . Days of Exercise per Week: Not on file  . Minutes of Exercise per Session: Not on file  Stress:   . Feeling of Stress : Not on file  Social Connections:   . Frequency of Communication with Friends and Family: Not on file  . Frequency of Social Gatherings with Friends and Family: Not on file  . Attends Religious Services: Not on file  . Active Member of Clubs or Organizations: Not on file  . Attends Archivist Meetings: Not on file  . Marital Status: Not on file  Intimate Partner Violence:   . Fear of Current or Ex-Partner: Not on file  . Emotionally Abused: Not on file  . Physically Abused: Not on file  . Sexually Abused: Not on file    Family History  Problem Relation Age of Onset  . Cancer Brother      Current Outpatient Medications:  .  acetaminophen (TYLENOL) 500 MG tablet, Take 1,000 mg by mouth every 6 (six) hours as needed for mild pain, moderate pain or headache., Disp: , Rfl:  .  albuterol (VENTOLIN HFA) 108 (90  Base) MCG/ACT inhaler, Inhale 2 puffs into the lungs every 4 (four) hours as needed., Disp: , Rfl:  .  ANORO ELLIPTA 62.5-25 MCG/INH AEPB, Inhale into the lungs., Disp: , Rfl:  .  benzonatate (TESSALON PERLES) 100 MG capsule, Take 1 capsule (100 mg total) by mouth every 6 (six) hours as needed for cough., Disp: 90 capsule, Rfl: 0 .  dexamethasone (DECADRON) 4 MG tablet, Take 2 tablets (8 mg total) by mouth daily. Start the day after chemotherapy for 2 days., Disp: 30 tablet, Rfl: 1 .  gabapentin (NEURONTIN) 100 MG capsule, Take 1 capsule (100 mg total) by mouth 3 (three) times daily., Disp: 90 capsule, Rfl: 0 .  lidocaine-prilocaine (EMLA) cream, Apply to affected area once, Disp: 30 g, Rfl: 3 .  LORazepam (ATIVAN) 0.5 MG tablet, Take 1 tablet (0.5 mg total) by mouth every 6 (six) hours as needed (Nausea or vomiting)., Disp: 30 tablet, Rfl: 0 .  omeprazole (  PRILOSEC) 20 MG capsule, Take 20 mg by mouth daily., Disp: , Rfl:  .  ondansetron (ZOFRAN) 8 MG tablet, Take 1 tablet (8 mg total) by mouth 2 (two) times daily as needed for refractory nausea / vomiting. Start on day 3 after chemo., Disp: 30 tablet, Rfl: 1 .  pantoprazole (PROTONIX) 40 MG tablet, Take 1 tablet (40 mg total) by mouth daily., Disp: 30 tablet, Rfl: 3 .  prochlorperazine (COMPAZINE) 10 MG tablet, Take 1 tablet (10 mg total) by mouth every 6 (six) hours as needed (Nausea or vomiting)., Disp: 30 tablet, Rfl: 1 .  traMADol (ULTRAM) 50 MG tablet, Take 1 tablet (50 mg total) by mouth every 6 (six) hours as needed., Disp: 120 tablet, Rfl: 0  Physical exam: There were no vitals filed for this visit. Physical Exam   CMP Latest Ref Rng & Units 09/02/2019  Glucose 70 - 99 mg/dL 140(H)  BUN 8 - 23 mg/dL 16  Creatinine 0.61 - 1.24 mg/dL 1.05  Sodium 135 - 145 mmol/L 137  Potassium 3.5 - 5.1 mmol/L 3.6  Chloride 98 - 111 mmol/L 104  CO2 22 - 32 mmol/L 22  Calcium 8.9 - 10.3 mg/dL 9.2   CBC Latest Ref Rng & Units 09/02/2019  WBC 4.0 - 10.5  K/uL 10.2  Hemoglobin 13.0 - 17.0 g/dL 11.7(L)  Hematocrit 39 - 52 % 36.5(L)  Platelets 150 - 400 K/uL 388    No images are attached to the encounter.  NM PET Image Initial (PI) Skull Base To Thigh  Result Date: 09/16/2019 CLINICAL DATA:  Initial treatment strategy for pulmonary masses and adenopathy. EXAM: NUCLEAR MEDICINE PET SKULL BASE TO THIGH TECHNIQUE: 9.818 mCi F-18 FDG was injected intravenously. Full-ring PET imaging was performed from the skull base to thigh after the radiotracer. CT data was obtained and used for attenuation correction and anatomic localization. Fasting blood glucose: 82 mg/dl COMPARISON:  CT chest of September 03, 2019 FINDINGS: Mediastinal blood pool activity: SUV max 1.96 Liver activity: SUV max NA NECK: Asymmetric activity in the LEFT oropharynx with some mild asymmetric fullness (image 28, series 3) this measures approximately 7 mm thickness (SUVmax = 5.5 amorphous uptake lateral to this area along pterygoid musculature without discrete lesion there is some misregistration of PET and CT data at this level. No additional area of abnormality is noted in the neck.) Incidental CT findings: none CHEST: Dominant abnormality in the LEFT chest corresponds to the LEFT lower lobe/infrahilar mass (image 100, series 3) this measures approximately 4.3 x 3.0 cm (SUVmax = 12.2) this includes infrahilar nodal tissue with enlargement. The Signs of AP window, LEFT subcarinal and subcarinal as well as RIGHT paratracheal nodal involvement with hypermetabolic lymph nodes. High RIGHT paratracheal lymph node (image 63, series 3) 11 mm (SUVmax = 5.9) Intense hypermetabolic changes in the LEFT AP window. Small lymph nodes noted in this area on the previous CT (SUVmax = 8.3) Enlarged subcarinal lymph node as seen on the recent CT (SUVmax = 7.3) (image 92, series 3). Nodal tissue posterior to the LEFT mainstem bronchus (image 93, series 3) (SUVmax = 6.2) Increased metabolic activity associated with  pre-vascular lymph nodes as well. Incidental CT findings: Calcified atheromatous changes minimal in the thoracic aorta. No pericardial effusion. No axillary adenopathy. Basilar atelectasis. ABDOMEN/PELVIS: No abnormal hypermetabolic activity within the liver, pancreas, adrenal glands, or spleen. No hypermetabolic lymph nodes in the abdomen or pelvis. Incidental CT findings: Atheromatous plaque in the abdominal aorta without aneurysmal dilation. RIGHT renal cysts. No  hydronephrosis. No acute bowel process. SKELETON: No focal hypermetabolic activity to suggest skeletal metastasis. Incidental CT findings: none IMPRESSION: 1. Findings of hypermetabolic mass in the LEFT lower lobe with mediastinal adenopathy and high RIGHT paratracheal adenopathy suspicious bronchogenic neoplasm with nodal disease as described. 2. No areas of hypermetabolic change in the abdomen or pelvis to suggest metastatic disease. 3. Asymmetric fullness along the lateral wall of the LEFT oropharynx and palatine tonsil. This is of uncertain significance as there is misregistration of PET data in this area. Consider direct visualization or CT of the neck for further evaluation. 4. Relative decreased PET activity at the level of the mid neck limits assessment of this area, related to artifact during acquisition likely related to attenuation correction and arm position. Aortic Atherosclerosis (ICD10-I70.0). Electronically Signed   By: Zetta Bills M.D.   On: 09/16/2019 15:52     Assessment and plan- Patient is a 69 y.o. male who presents to [facility] for initial meeting in preparation for starting chemotherapy for the treatment of lung cancer.   1. HPI: Patient is a 69 year old male with a past medical history significant for hypertension and hyperlipidemia who presented to the ER with symptoms of worsening cough and shortness of breath. He underwent CT angios chest which did not show any PE but were concerning for primary bronchogenic  carcinoma.  He was started on Augmentin.  PET scan showed hypermetabolic mass in left lower lobe with mediastinal adenopathy and right paratracheal adenopathy.  No evidence of distant metastasis.  Plan is to begin carbotaxol chemotherapy with concurrent radiation and maintenance immunotherapy.  2. Chemo Care Clinic/High Risk for ER/Hospitalization during chemotherapy- We discussed the role of the chemo care clinic and identified patient specific risk factors. I discussed that patient was identified as high risk primarily based on: age and stage of disease.   Patient has past medical history positive for: Past Medical History:  Diagnosis Date  . Dyspnea   . Hyperlipidemia   . Hypertension     Patient has past surgical history positive for: Past Surgical History:  Procedure Laterality Date  . VIDEO BRONCHOSCOPY WITH ENDOBRONCHIAL ULTRASOUND N/A 09/25/2019   Procedure: VIDEO BRONCHOSCOPY WITH ENDOBRONCHIAL ULTRASOUND;  Surgeon: Tyler Pita, MD;  Location: [facility] ORS;  Service: Pulmonary;  Laterality: N/A;    Based on our high risk symptom management report; this patient has a high risk of ED utilization.  The percentage below indicates how "at risk "  this patient based on the factors in this table within one year.  General Risk Score: 5  Values used to calculate this score:   Points  Metrics      1        Age: 67      1        Hospital Admissions: 1      2        ED Visits: 2      1        Has Chronic Obstructive Pulmonary Disease: Yes      0        Has Diabetes: No      0        Has Congestive Heart Failure: No      0        Has liver disease: No      0        Has Depression: No      0        Current PCP:  Stella      0        Has Medicaid: No  3. We discussed that social determinants of health may have significant impacts on health and outcomes for cancer patients.  Today we discussed specific social determinants of performance status, alcohol use,  depression, financial needs, food insecurity, housing, interpersonal violence, social connections, stress, tobacco use, and transportation.    After lengthy discussion the following were identified as areas of need: Patient would like help with management of his medications.  Referral sent to palliative care to help adjust medications.  He is currently taking tramadol 50 mg by mouth every 6 hours as needed.  States it is not working.  Outpatient services: We discussed options including home based and outpatient services, DME and care program. We discusssed that patients who participate in regular physical activity report fewer negative impacts of cancer and treatments and report less fatigue.   Financial Concerns: We discussed that living with cancer can create tremendous financial burden.  We discussed options for assistance. I asked that if assistance is needed in affording medications or paying bills to please let us know so that we can provide assistance. We discussed options for food including social services, [employer] ($50 every 2 weeks) and onsite food pantry.  We will also notify [facility] to see if cancer center can provide additional support.  Referral to Social work: Introduced Education officer, museum Elease Etienne and the services he can provide such as support with [employer], cell phone and gas vouchers.   Support groups: We discussed options for support groups at the cancer center. If interested, please notify nurse navigator to enroll. We discussed options for managing stress including healthy eating, exercise as well as participating in no charge counseling services at the cancer center and support groups.  If these are of interest, patient can notify either myself or primary nursing team.We discussed options for management including medications and referral to quit Smart program  Transportation: We discussed options for transportation including acta, paratransit, bus routes, link  transit, taxi/uber/lyft, and cancer center [place].  I have notified primary oncology team who will help assist with arranging Lucianne Lei transportation for appointments when/if needed. We also discussed options for transportation on short notice/acute visits.  Palliative care services: We have palliative care services available in the cancer center to discuss goals of care and advanced care planning.  Please let us know if you have any questions or would like to speak to our palliative nurse practitioner.  Symptom Management Clinic: We discussed our symptom management clinic which is available for acute concerns while receiving treatment such as nausea, vomiting or diarrhea.  We can be reached via telephone at [phone] or through my chart.  We are available for virtual or in person visits on the same day from 830 to 4 PM Monday through Friday. He denies needing specific assistance at this time and He will be followed by Dr. Elroy Channel clinical team.  Plan: Discussed symptom management clinic. Discussed palliative care services. Discussed resources that are available here at the cancer center. Discussed medications and new prescriptions to begin treatment such as anti-nausea or steroids.   Disposition: RTC on 10/17/2019 for simulation prior to initiating radiation. RTC on 10/26/2019 for labs, MD assessment and chemotherapy.  Visit Diagnosis 1. Malignant neoplasm of lower lobe of left lung [facility])     Patient expressed understanding and was in agreement with this plan. He also understands that He can call  clinic at any time with any questions, concerns, or complaints.   Greater than 50% was spent in counseling and coordination of care with this patient including but not limited to discussion of the relevant topics above (See A&P) including, but not limited to diagnosis and management of acute and chronic medical conditions.   [facility] [place]  CC: Dr. Janese Banks

## 2019-10-11 ENCOUNTER — Telehealth: Payer: Self-pay | Admitting: *Deleted

## 2019-10-11 ENCOUNTER — Encounter: Payer: Self-pay | Admitting: Oncology

## 2019-10-11 ENCOUNTER — Other Ambulatory Visit: Payer: Self-pay

## 2019-10-11 MED ORDER — OXYCODONE HCL 5 MG PO TABS: 5.0000 mg | ORAL_TABLET | ORAL | 0 refills | Status: DC | PRN

## 2019-10-11 NOTE — Telephone Encounter (Signed)
Oxycodone 5 mg q4 prn. 90 tab for now

## 2019-10-11 NOTE — Telephone Encounter (Signed)
Margaretha Sheffield, patient sister called reporting that patient is having pain and that the Tramadol and use of Tylenol is not helping at all. He is requesting something for his pain. Please advise

## 2019-10-11 NOTE — Telephone Encounter (Signed)
Patient informed that prescription has been sen Psychologist, sport and exercise on [employer]

## 2019-10-11 NOTE — Telephone Encounter (Signed)
Spoke with sister elaine  And verified pharmacy and will  Pick up medicatio for patient.

## 2019-10-14 ENCOUNTER — Encounter: Payer: Self-pay | Admitting: Oncology

## 2019-10-15 DIAGNOSIS — Z510 Encounter for antineoplastic radiation therapy: Secondary | ICD-10-CM | POA: Diagnosis not present

## 2019-10-17 ENCOUNTER — Inpatient Hospital Stay: Payer: Medicaid Other | Attending: Hospice and Palliative Medicine | Admitting: Hospice and Palliative Medicine

## 2019-10-17 ENCOUNTER — Other Ambulatory Visit: Payer: Self-pay

## 2019-10-17 ENCOUNTER — Ambulatory Visit: Admission: RE | Admit: 2019-10-17 | Payer: Medicaid Other | Source: Ambulatory Visit

## 2019-10-17 VITALS — BP 132/84 | HR 83 | Temp 98.4°F | Resp 16 | Ht 70.0 in | Wt 186.9 lb

## 2019-10-17 DIAGNOSIS — C3431 Malignant neoplasm of lower lobe, right bronchus or lung: Secondary | ICD-10-CM | POA: Diagnosis not present

## 2019-10-17 DIAGNOSIS — R634 Abnormal weight loss: Secondary | ICD-10-CM | POA: Insufficient documentation

## 2019-10-17 DIAGNOSIS — K30 Functional dyspepsia: Secondary | ICD-10-CM | POA: Diagnosis not present

## 2019-10-17 DIAGNOSIS — C7802 Secondary malignant neoplasm of left lung: Secondary | ICD-10-CM | POA: Insufficient documentation

## 2019-10-17 DIAGNOSIS — E785 Hyperlipidemia, unspecified: Secondary | ICD-10-CM | POA: Diagnosis not present

## 2019-10-17 DIAGNOSIS — G893 Neoplasm related pain (acute) (chronic): Secondary | ICD-10-CM | POA: Diagnosis not present

## 2019-10-17 DIAGNOSIS — Z79899 Other long term (current) drug therapy: Secondary | ICD-10-CM | POA: Diagnosis not present

## 2019-10-17 DIAGNOSIS — Z87891 Personal history of nicotine dependence: Secondary | ICD-10-CM | POA: Insufficient documentation

## 2019-10-17 DIAGNOSIS — C778 Secondary and unspecified malignant neoplasm of lymph nodes of multiple regions: Secondary | ICD-10-CM | POA: Insufficient documentation

## 2019-10-17 DIAGNOSIS — C3432 Malignant neoplasm of lower lobe, left bronchus or lung: Secondary | ICD-10-CM | POA: Diagnosis not present

## 2019-10-17 DIAGNOSIS — Z515 Encounter for palliative care: Secondary | ICD-10-CM | POA: Insufficient documentation

## 2019-10-17 DIAGNOSIS — Z5189 Encounter for other specified aftercare: Secondary | ICD-10-CM | POA: Insufficient documentation

## 2019-10-17 DIAGNOSIS — Z5111 Encounter for antineoplastic chemotherapy: Secondary | ICD-10-CM | POA: Diagnosis present

## 2019-10-17 DIAGNOSIS — L299 Pruritus, unspecified: Secondary | ICD-10-CM | POA: Insufficient documentation

## 2019-10-17 DIAGNOSIS — K5900 Constipation, unspecified: Secondary | ICD-10-CM | POA: Insufficient documentation

## 2019-10-17 DIAGNOSIS — D509 Iron deficiency anemia, unspecified: Secondary | ICD-10-CM | POA: Diagnosis not present

## 2019-10-17 DIAGNOSIS — I10 Essential (primary) hypertension: Secondary | ICD-10-CM | POA: Insufficient documentation

## 2019-10-17 MED ORDER — OXYCODONE HCL 5 MG PO TABS: 5.0000 mg | ORAL_TABLET | ORAL | 0 refills | Status: DC | PRN

## 2019-10-17 NOTE — Progress Notes (Signed)
[place]  Telephone:([phone] [phone]   Name: Mitchell Frye Date: 10/17/2019 MRN: [id number]  DOB: 01-27-1951  Patient Care Team: Center, [facility] as PCP - Lillia Mountain, [place], RN as Oncology Nurse Navigator    REASON FOR CONSULTATION: Mitchell Frye is a 69 y.o. 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  PET/CT showed hypermetabolic mass in the left lower lobe with mediastinal and right paratracheal adenopathy.  Pathology was consistent with adenocarcinoma.  Patient was started on concurrent chemoradiation with plan for maintenance immunotherapy.  He was referred to palliative care to help address goals and manage ongoing symptoms.  SOCIAL HISTORY:     reports that he quit smoking about 27 years ago. His smoking use included cigarettes. He has a 20.00 pack-year smoking history. He has never used smokeless tobacco. He reports current alcohol use. He reports that he does not use drugs.  Patient lives at home with his sister who is his primary caregiver.  He does not drive.  ADVANCE DIRECTIVES:  Does not have  CODE STATUS:   PAST MEDICAL HISTORY: Past Medical History:  Diagnosis Date  . Dyspnea   . Hyperlipidemia   . Hypertension     PAST SURGICAL HISTORY:  Past Surgical History:  Procedure Laterality Date  . VIDEO BRONCHOSCOPY WITH ENDOBRONCHIAL ULTRASOUND N/A 09/25/2019   Procedure: VIDEO BRONCHOSCOPY WITH ENDOBRONCHIAL ULTRASOUND;  Surgeon: Tyler Pita, MD;  Location: [facility] ORS;  Service: Pulmonary;  Laterality: N/A;    HEMATOLOGY/ONCOLOGY HISTORY:  Oncology History  Malignant neoplasm of lower lobe of left lung ([place])  10/04/2019 Initial Diagnosis   Malignant neoplasm of lower lobe of left lung ([place])   10/04/2019 Cancer Staging   Staging form: Lung, AJCC 8th Edition -  Clinical stage from 10/04/2019: Stage IIIB (cT2b, cN3, cM0) - Signed by Mitchell Guadeloupe, MD on 10/04/2019   10/05/2019 -  Chemotherapy   The patient had dexamethasone (DECADRON) 4 MG tablet, 8 mg, Oral, Daily, 1 of 1 cycle, Start date: 10/04/2019, End date: -- palonosetron (ALOXI) injection 0.25 mg, 0.25 mg, Intravenous,  Once, 0 of 4 cycles CARBOplatin (PARAPLATIN) in sodium chloride 0.9 % 100 mL chemo infusion, , Intravenous,  Once, 0 of 4 cycles PACLitaxel (TAXOL) 90 mg in sodium chloride 0.9 % 250 mL chemo infusion (</= 24m/m2), 45 mg/m2, Intravenous,  Once, 0 of 4 cycles  for chemotherapy treatment.      ALLERGIES:  has No Known Allergies.  MEDICATIONS:  Current Outpatient Medications  Medication Sig Dispense Refill  . acetaminophen (TYLENOL) 500 MG tablet Take 1,000 mg by mouth every 6 (six) hours as needed for mild pain, moderate pain or headache.    . albuterol (VENTOLIN HFA) 108 (90 Base) MCG/ACT inhaler Inhale 2 puffs into the lungs every 4 (four) hours as needed for wheezing or shortness of breath.     .Jearl KlinefelterELLIPTA 62.5-25 MCG/INH AEPB Inhale 1 puff into the lungs daily.     . benzonatate (TESSALON PERLES) 100 MG capsule Take 1 capsule (100 mg total) by mouth every 6 (six) hours as needed for cough. 90 capsule 0  . dexamethasone (DECADRON) 4 MG tablet Take 2 tablets (8 mg total) by mouth daily. Start the day after chemotherapy for 2 days. 30 tablet 1  . gabapentin (NEURONTIN) 100 MG capsule Take 1 capsule (100 mg total) by mouth 3 (three) times daily.  90 capsule 0  . lidocaine-prilocaine (EMLA) cream Apply to affected area once (Patient taking differently: Apply 1 application topically once. Apply to affected area once) 30 g 3  . LORazepam (ATIVAN) 0.5 MG tablet Take 1 tablet (0.5 mg total) by mouth every 6 (six) hours as needed (Nausea or vomiting). 30 tablet 0  . omeprazole (PRILOSEC) 20 MG capsule Take 20 mg by mouth daily.    . ondansetron (ZOFRAN) 8 MG tablet Take 1 tablet (8  mg total) by mouth 2 (two) times daily as needed for refractory nausea / vomiting. Start on day 3 after chemo. 30 tablet 1  . oxyCODONE (OXY IR/ROXICODONE) 5 MG immediate release tablet Take 1 tablet (5 mg total) by mouth every 4 (four) hours as needed for severe pain. 90 tablet 0  . pantoprazole (PROTONIX) 40 MG tablet Take 1 tablet (40 mg total) by mouth daily. (Patient taking differently: Take 40 mg by mouth 2 (two) times daily. ) 30 tablet 3  . prochlorperazine (COMPAZINE) 10 MG tablet Take 1 tablet (10 mg total) by mouth every 6 (six) hours as needed (Nausea or vomiting). 30 tablet 1  . traMADol (ULTRAM) 50 MG tablet Take 1 tablet (50 mg total) by mouth every 6 (six) hours as needed. (Patient taking differently: Take 50 mg by mouth every 6 (six) hours as needed for moderate pain. ) 120 tablet 0   No current facility-administered medications for this visit.    VITAL SIGNS: There were no vitals taken for this visit. There were no vitals filed for this visit.  Estimated body mass index is 26.93 kg/m as calculated from the following:   Height as of 09/20/19: _0  (1.778 m).   Weight as of 10/07/19: 187 lb 11.2 oz (85.1 kg).  LABS: CBC:    Component Value Date/Time   WBC 10.2 09/02/2019 2022   HGB 11.7 (L) 09/02/2019 2022   HCT 36.5 (L) 09/02/2019 2022   PLT 388 09/02/2019 2022   MCV 80.9 09/02/2019 2022   Comprehensive Metabolic Panel:    Component Value Date/Time   NA 137 09/02/2019 2022   K 3.6 09/02/2019 2022   CL 104 09/02/2019 2022   CO2 22 09/02/2019 2022   BUN 16 09/02/2019 2022   CREATININE 0.80 10/10/2019 1324   GLUCOSE 140 (H) 09/02/2019 2022   CALCIUM 9.2 09/02/2019 2022    RADIOGRAPHIC STUDIES: CT Head W Wo Contrast  Result Date: 10/10/2019 CLINICAL DATA:  69 year old male with recently diagnosed non-small cell lung cancer. Headaches x1 month. Staging. EXAM: CT HEAD WITHOUT AND WITH CONTRAST TECHNIQUE: Contiguous axial images were obtained from the base of the  skull through the vertex without and with intravenous contrast CONTRAST:  31m OMNIPAQUE IOHEXOL 300 MG/ML  SOLN COMPARISON:  PET-CT 09/16/2019 FINDINGS: Brain: No intracranial mass effect or midline shift. No ventriculomegaly. No acute intracranial hemorrhage identified. No cortically based acute infarct identified. Patchy and confluent bilateral periventricular and other cerebral white matter hypodensity. Some involvement of the deep white matter capsules. No cortical encephalomalacia identified. No abnormal enhancement identified. Vascular: The major intracranial vascular structures are enhancing as expected. Skull: Negative. Sinuses/Orbits: Visualized paranasal sinuses and mastoids are clear. Other: Visualized orbits and scalp soft tissues are within normal limits. IMPRESSION: 1. No metastatic disease or acute intracranial abnormality identified. 2. 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 Electronically Signed   By: HGenevie AnnM.D.   On: 10/10/2019 16:18    PERFORMANCE STATUS (ECOG) :  1  Review of Systems Unless otherwise noted, a complete review of systems is negative.  Physical Exam General: NAD Cardiovascular: regular rate and rhythm Pulmonary: clear ant fields Abdomen: soft, nontender, + bowel sounds GU: no suprapubic tenderness Extremities: no edema, no joint deformities Skin: no rashes Neurological: Weakness but otherwise nonfocal  IMPRESSION: I met with patient and sister today in clinic.  I introduced palliative care services and attempted to establish therapeutic rapport.  Patient is scheduled to receive his first XRT today.  He would then proceed with chemotherapy per Dr. Elroy Channel note.  Patient endorses persistent and severe indigestion and chest pain.  He says that he has been hurting since he was diagnosed with the cancer.  He describes the pain as somewhat of a burning sensation.  I note that he is taking Protonix daily.   He says he is also drinking beer which columns of burning.  I explained the role of alcohol and relaxing the LES and discouraged drinking.  We discussed nonpharmacologic strategies for managing GERD.  He is also taking oxycodone for pain and says that it helps.  He says pain is often 9 out of 10 and will lower to 7 out of 10 after he takes oxycodone.  However, he describes it is short-lived.  I did discuss the option of increasing oxycodone today to 1 to 2 tablets as needed.  Could consider initiation of a long-acting opioid if is needed.  Patient has poor oral intake.  He has had weight loss.  Will refer to dietitian.  I did provide him with some samples of oral supplements today.  He has constipation.  We discussed bowel regimen.  Patient does not have any advance directives.  I sent him home with ACP documents and a MOST form to review.  PLAN: -Continue current scope of treatment -May increase oxycodone 5 to 10 mg every 4 hours as needed -Consider adding an LAO if needed -Patient to start bowel regimen with MiraLAX and as needed Senokot -Continue Protonix 40 mg daily -Could consider rotating to [place] if reflux symptoms persist -Start oral supplements to 3 times daily -Referral to nutrition -ACP and MOST form reviewed -RTC in 2 weeks   Patient expressed understanding and was in agreement with this plan. He also understands that He can call the clinic at any time with any questions, concerns, or complaints.     Time Total: 25 minutes  Visit consisted of counseling and education dealing with the complex and emotionally intense issues of symptom management and palliative care in the setting of serious and potentially life-threatening illness.Greater than 50%  of this time was spent counseling and coordinating care related to the above assessment and plan.  Signed by: Altha Harm, PhD, NP-C

## 2019-10-17 NOTE — Progress Notes (Signed)
Pt having pain not relieved with pain med. He says he has reflux feeling and drinks little bit of beer and it helps. He does get harder to breathe lying flat and told them to sit in recliner or lay down with lots of pillows.

## 2019-10-18 ENCOUNTER — Other Ambulatory Visit
Admission: RE | Admit: 2019-10-18 | Discharge: 2019-10-18 | Disposition: A | Discharge status: Home / Self Care | Payer: Medicaid Other | Source: Ambulatory Visit | Attending: Vascular Surgery | Admitting: Vascular Surgery

## 2019-10-18 DIAGNOSIS — Z20822 Contact with and (suspected) exposure to covid-19: Secondary | ICD-10-CM | POA: Diagnosis not present

## 2019-10-18 DIAGNOSIS — Z01812 Encounter for preprocedural laboratory examination: Secondary | ICD-10-CM | POA: Insufficient documentation

## 2019-10-18 LAB — SARS CORONAVIRUS 2 (TAT 6-24 HRS): SARS Coronavirus 2: NEGATIVE

## 2019-10-20 ENCOUNTER — Other Ambulatory Visit: Payer: Self-pay | Admitting: Oncology

## 2019-10-20 DIAGNOSIS — C3432 Malignant neoplasm of lower lobe, left bronchus or lung: Secondary | ICD-10-CM

## 2019-10-21 ENCOUNTER — Other Ambulatory Visit (INDEPENDENT_AMBULATORY_CARE_PROVIDER_SITE_OTHER): Payer: Self-pay | Admitting: Nurse Practitioner

## 2019-10-22 ENCOUNTER — Ambulatory Visit
Admission: RE | Admit: 2019-10-22 | Discharge: 2019-10-22 | Disposition: A | Discharge status: Home / Self Care | Payer: Medicaid Other | Source: Ambulatory Visit | Attending: Radiation Oncology | Admitting: Radiation Oncology

## 2019-10-22 ENCOUNTER — Encounter
Admission: RE | Disposition: A | Discharge status: Home / Self Care | Payer: Self-pay | Source: Home / Self Care | Attending: Vascular Surgery

## 2019-10-22 ENCOUNTER — Other Ambulatory Visit: Payer: Self-pay | Admitting: Pulmonary Disease

## 2019-10-22 ENCOUNTER — Encounter: Payer: Self-pay | Admitting: Vascular Surgery

## 2019-10-22 ENCOUNTER — Ambulatory Visit
Admission: RE | Admit: 2019-10-22 | Discharge: 2019-10-22 | Disposition: A | Discharge status: Home / Self Care | Payer: Medicaid Other | Attending: Vascular Surgery | Admitting: Vascular Surgery

## 2019-10-22 ENCOUNTER — Other Ambulatory Visit: Payer: Self-pay

## 2019-10-22 DIAGNOSIS — C3492 Malignant neoplasm of unspecified part of left bronchus or lung: Secondary | ICD-10-CM | POA: Diagnosis present

## 2019-10-22 DIAGNOSIS — Z809 Family history of malignant neoplasm, unspecified: Secondary | ICD-10-CM | POA: Diagnosis not present

## 2019-10-22 DIAGNOSIS — Z510 Encounter for antineoplastic radiation therapy: Secondary | ICD-10-CM | POA: Diagnosis present

## 2019-10-22 DIAGNOSIS — R590 Localized enlarged lymph nodes: Secondary | ICD-10-CM | POA: Diagnosis not present

## 2019-10-22 DIAGNOSIS — C3432 Malignant neoplasm of lower lobe, left bronchus or lung: Secondary | ICD-10-CM | POA: Insufficient documentation

## 2019-10-22 DIAGNOSIS — I10 Essential (primary) hypertension: Secondary | ICD-10-CM | POA: Diagnosis not present

## 2019-10-22 DIAGNOSIS — Z87891 Personal history of nicotine dependence: Secondary | ICD-10-CM | POA: Insufficient documentation

## 2019-10-22 DIAGNOSIS — Z79899 Other long term (current) drug therapy: Secondary | ICD-10-CM | POA: Diagnosis not present

## 2019-10-22 DIAGNOSIS — C3490 Malignant neoplasm of unspecified part of unspecified bronchus or lung: Secondary | ICD-10-CM

## 2019-10-22 DIAGNOSIS — E785 Hyperlipidemia, unspecified: Secondary | ICD-10-CM | POA: Diagnosis not present

## 2019-10-22 HISTORY — PX: PORTA CATH INSERTION: CATH118285

## 2019-10-22 SURGERY — PORTA CATH INSERTION
Anesthesia: Moderate Sedation

## 2019-10-22 MED ORDER — FENTANYL CITRATE (PF) 100 MCG/2ML IJ SOLN
INTRAMUSCULAR | Status: DC | PRN
Start: 2019-10-22 — End: 2019-10-22
  Administered 2019-10-22 (×2): 25 ug via INTRAVENOUS
  Administered 2019-10-22: 50 ug via INTRAVENOUS

## 2019-10-22 MED ORDER — CEFAZOLIN SODIUM-DEXTROSE 2-4 GM/100ML-% IV SOLN
INTRAVENOUS | Status: AC
  Administered 2019-10-22: 2 g via INTRAVENOUS
  Filled 2019-10-22: qty 100

## 2019-10-22 MED ORDER — MIDAZOLAM HCL 2 MG/2ML IJ SOLN
INTRAMUSCULAR | Status: DC | PRN
  Administered 2019-10-22: 0.5 mg via INTRAVENOUS
  Administered 2019-10-22: 2 mg via INTRAVENOUS
  Administered 2019-10-22: 0.5 mg via INTRAVENOUS

## 2019-10-22 MED ORDER — CHLORHEXIDINE GLUCONATE CLOTH 2 % EX PADS
6.0000 | MEDICATED_PAD | Freq: Every day | CUTANEOUS | Status: DC
  Administered 2019-10-22: 6 via TOPICAL

## 2019-10-22 MED ORDER — FAMOTIDINE 20 MG PO TABS: 40.0000 mg | ORAL_TABLET | Freq: Once | ORAL | Status: DC | PRN

## 2019-10-22 MED ORDER — MIDAZOLAM HCL 5 MG/5ML IJ SOLN
INTRAMUSCULAR | Status: AC
  Filled 2019-10-22: qty 5

## 2019-10-22 MED ORDER — SODIUM CHLORIDE 0.9 % IV SOLN: INTRAVENOUS | Status: DC

## 2019-10-22 MED ORDER — METHYLPREDNISOLONE SODIUM SUCC 125 MG IJ SOLR: 125.0000 mg | Freq: Once | INTRAMUSCULAR | Status: DC | PRN

## 2019-10-22 MED ORDER — DIPHENHYDRAMINE HCL 50 MG/ML IJ SOLN: 50.0000 mg | Freq: Once | INTRAMUSCULAR | Status: DC | PRN

## 2019-10-22 MED ORDER — CEFAZOLIN SODIUM-DEXTROSE 2-4 GM/100ML-% IV SOLN: 2.0000 g | Freq: Once | INTRAVENOUS | Status: AC

## 2019-10-22 MED ORDER — ONDANSETRON HCL 4 MG/2ML IJ SOLN: 4.0000 mg | Freq: Four times a day (QID) | INTRAMUSCULAR | Status: DC | PRN

## 2019-10-22 MED ORDER — HYDROMORPHONE HCL 1 MG/ML IJ SOLN: 1.0000 mg | Freq: Once | INTRAMUSCULAR | Status: DC | PRN

## 2019-10-22 MED ORDER — MIDAZOLAM HCL 2 MG/ML PO SYRP: 8.0000 mg | ORAL_SOLUTION | Freq: Once | ORAL | Status: DC | PRN

## 2019-10-22 MED ORDER — FENTANYL CITRATE (PF) 100 MCG/2ML IJ SOLN
INTRAMUSCULAR | Status: DC
Start: 2019-10-22 — End: 2019-10-22
  Filled 2019-10-22: qty 2

## 2019-10-22 SURGICAL SUPPLY — 10 items
DERMABOND ADVANCED (GAUZE/BANDAGES/DRESSINGS) ×2
DERMABOND ADVANCED .7 DNX12 (GAUZE/BANDAGES/DRESSINGS) ×1 IMPLANT
KIT PORT POWER 8FR ISP CVUE (Port) ×3 IMPLANT
NEEDLE ENTRY 21GA 7CM ECHOTIP (NEEDLE) ×3 IMPLANT
PACK ANGIOGRAPHY (CUSTOM PROCEDURE TRAY) ×3 IMPLANT
SET INTRO CAPELLA COAXIAL (SET/KITS/TRAYS/PACK) ×3 IMPLANT
SPONGE XRAY 4X4 16PLY STRL (MISCELLANEOUS) ×3 IMPLANT
SUT MNCRL AB 4-0 PS2 18 (SUTURE) ×3 IMPLANT
SUT VIC AB 3-0 SH 27 (SUTURE) ×2
SUT VIC AB 3-0 SH 27X BRD (SUTURE) ×1 IMPLANT

## 2019-10-22 NOTE — Op Note (Signed)
OPERATIVE NOTE   PROCEDURE: 1. Placement of a right IJ Infuse-a-Port  PRE-OPERATIVE DIAGNOSIS: Lung carcinoma  POST-OPERATIVE DIAGNOSIS: Same  SURGEON: Katha Cabal M.D.  ANESTHESIA: Conscious sedation was administered under my direct supervision by the interventional radiology RN. IV Versed plus fentanyl were utilized. Continuous ECG, pulse oximetry and blood pressure was monitored throughout the entire procedure. Conscious sedation was for a total of 40 minutes.  ESTIMATED BLOOD LOSS: Minimal   FINDING(S): 1.  Patent vein  SPECIMEN(S): None  INDICATIONS:   Mitchell Frye is a 69 y.o. male who presents with lung carcinoma.  He requires parenteral chemotherapy and therefore appropriate IV access.  Risks and benefits for port placement been reviewed all questions been answered patient agrees to proceed.  DESCRIPTION: After obtaining full informed written consent, the patient was brought back to the special procedure suite and placed in the supine position. The patient's right neck and chest wall are prepped and draped in sterile fashion. Appropriate timeout was called.  Ultrasound is placed in a sterile sleeve, ultrasound is utilized to avoid vascular injury as well as secondary to lack of appropriate landmarks. The right internal jugular vein is identified. It is echolucent and homogeneous as well as easily compressible indicating patency. An image is recorded for the permanent record.  Access to the vein with a micropuncture needle is done under direct ultrasound visualization.  1% lidocaine is infiltrated into the soft tissue at the base of the neck as well as on the chest wall.  Under direct ultrasound visualization a micro-needle is inserted into the vein followed by the micro-wire. Micro-sheath was then advanced and a J wire is inserted without difficulty under fluoroscopic guidance. A small counterincision was created at the wire insertion site. A transverse incision is created  2 fingerbreadths below the scapula and a pocket is fashioned using both blunt and sharp dissection. The pocket is tested for appropriate size with the hub of the Infuse-a-Port. The tunneling device is then used to pull the intravascular portion of the catheter from the pocket to the neck counterincision.  Dilator and peel-away sheath were then inserted over the wire and the wire is removed. Catheter is then advanced into the venous system without difficulty. Peel-away sheath was then removed.  Catheter is then positioned under fluoroscopic guidance at the atrial caval junction. It is then transected connected to the hub and the hope is slipped into the subcutaneous pocket on the chest wall. The hub was then accessed percutaneously and aspirates easily and flushes well and is flushed with 30 cc of heparinized saline. The pocket incision is then closed in layers using interrupted 3-0 Vicryl for the subcutaneous tissues and 4-0 Monocryl subcuticular for skin closure. Dermabond is applied. The neck counterincision was closed with 4-0 Monocryl subcuticular and Dermabond as well.  The patient tolerated the procedure well and there were no immediate complications.  COMPLICATIONS: None  CONDITION: Unchanged  Katha Cabal M.D. [facility] Office: [phone]   10/22/2019, 3:13 PM

## 2019-10-22 NOTE — Interval H&P Note (Signed)
History and Physical Interval Note:  10/22/2019 2:06 PM  Mitchell Frye  has presented today for surgery, with the diagnosis of Porta Cath Placement   Lung Ca  Covid  Sept 2.  The various methods of treatment have been discussed with the patient and family. After consideration of risks, benefits and other options for treatment, the patient has consented to  Procedure(s): PORTA CATH INSERTION (N/A) as a surgical intervention.  The patient's history has been reviewed, patient examined, no change in status, stable for surgery.  I have reviewed the patient's chart and labs.  Questions were answered to the patient's satisfaction.     Hortencia Pilar

## 2019-10-23 ENCOUNTER — Ambulatory Visit
Admission: RE | Admit: 2019-10-23 | Discharge: 2019-10-23 | Disposition: A | Discharge status: Home / Self Care | Payer: Medicaid Other | Source: Ambulatory Visit | Attending: Radiation Oncology | Admitting: Radiation Oncology

## 2019-10-23 ENCOUNTER — Encounter: Payer: Self-pay | Admitting: Vascular Surgery

## 2019-10-23 DIAGNOSIS — Z510 Encounter for antineoplastic radiation therapy: Secondary | ICD-10-CM | POA: Diagnosis not present

## 2019-10-24 ENCOUNTER — Ambulatory Visit
Admission: RE | Admit: 2019-10-24 | Discharge: 2019-10-24 | Disposition: A | Discharge status: Home / Self Care | Payer: Medicaid Other | Source: Ambulatory Visit | Attending: Radiation Oncology | Admitting: Radiation Oncology

## 2019-10-24 DIAGNOSIS — Z510 Encounter for antineoplastic radiation therapy: Secondary | ICD-10-CM | POA: Diagnosis not present

## 2019-10-25 ENCOUNTER — Ambulatory Visit
Admission: RE | Admit: 2019-10-25 | Discharge: 2019-10-25 | Disposition: A | Discharge status: Home / Self Care | Payer: Medicaid Other | Source: Ambulatory Visit | Attending: Radiation Oncology | Admitting: Radiation Oncology

## 2019-10-25 DIAGNOSIS — Z510 Encounter for antineoplastic radiation therapy: Secondary | ICD-10-CM | POA: Diagnosis not present

## 2019-10-28 ENCOUNTER — Encounter: Payer: Self-pay | Admitting: Oncology

## 2019-10-28 ENCOUNTER — Encounter: Payer: Self-pay | Admitting: *Deleted

## 2019-10-28 ENCOUNTER — Ambulatory Visit
Admission: RE | Admit: 2019-10-28 | Discharge: 2019-10-28 | Disposition: A | Discharge status: Home / Self Care | Payer: Medicaid Other | Source: Ambulatory Visit | Attending: Radiation Oncology | Admitting: Radiation Oncology

## 2019-10-28 ENCOUNTER — Other Ambulatory Visit: Payer: Self-pay

## 2019-10-28 ENCOUNTER — Inpatient Hospital Stay: Payer: Medicaid Other

## 2019-10-28 ENCOUNTER — Inpatient Hospital Stay (HOSPITAL_BASED_OUTPATIENT_CLINIC_OR_DEPARTMENT_OTHER): Payer: Medicaid Other | Admitting: Hospice and Palliative Medicine

## 2019-10-28 ENCOUNTER — Inpatient Hospital Stay (HOSPITAL_BASED_OUTPATIENT_CLINIC_OR_DEPARTMENT_OTHER): Payer: Medicaid Other | Admitting: Oncology

## 2019-10-28 VITALS — BP 126/81 | HR 99 | Resp 22

## 2019-10-28 VITALS — BP 130/79 | HR 86 | Temp 97.3°F | Wt 187.5 lb

## 2019-10-28 DIAGNOSIS — Z5111 Encounter for antineoplastic chemotherapy: Secondary | ICD-10-CM

## 2019-10-28 DIAGNOSIS — Z515 Encounter for palliative care: Secondary | ICD-10-CM

## 2019-10-28 DIAGNOSIS — C3432 Malignant neoplasm of lower lobe, left bronchus or lung: Secondary | ICD-10-CM | POA: Diagnosis not present

## 2019-10-28 DIAGNOSIS — D509 Iron deficiency anemia, unspecified: Secondary | ICD-10-CM | POA: Diagnosis not present

## 2019-10-28 DIAGNOSIS — Z7189 Other specified counseling: Secondary | ICD-10-CM

## 2019-10-28 DIAGNOSIS — Z510 Encounter for antineoplastic radiation therapy: Secondary | ICD-10-CM | POA: Diagnosis not present

## 2019-10-28 LAB — COMPREHENSIVE METABOLIC PANEL WITH GFR
ALT: 13 U/L (ref 0–44)
AST: 17 U/L (ref 15–41)
Albumin: 3 g/dL — ABNORMAL LOW (ref 3.5–5.0)
Alkaline Phosphatase: 46 U/L (ref 38–126)
Anion gap: 8 (ref 5–15)
BUN: 15 mg/dL (ref 8–23)
CO2: 26 mmol/L (ref 22–32)
Calcium: 8.9 mg/dL (ref 8.9–10.3)
Chloride: 107 mmol/L (ref 98–111)
Creatinine, Ser: 0.7 mg/dL (ref 0.61–1.24)
GFR calc Af Amer: >60 mL/min
GFR calc non Af Amer: >60 mL/min
Glucose, Bld: 120 mg/dL — ABNORMAL HIGH (ref 70–99)
Potassium: 3.7 mmol/L (ref 3.5–5.1)
Sodium: 141 mmol/L (ref 135–145)
Total Bilirubin: 0.4 mg/dL (ref 0.3–1.2)
Total Protein: 7.7 g/dL (ref 6.5–8.1)

## 2019-10-28 LAB — CBC WITH DIFFERENTIAL/PLATELET
Abs Immature Granulocytes: 0.02 K/uL (ref 0.00–0.07)
Basophils Absolute: 0 K/uL (ref 0.0–0.1)
Basophils Relative: 0 %
Eosinophils Absolute: 0.1 K/uL (ref 0.0–0.5)
Eosinophils Relative: 1 %
HCT: 30.7 % — ABNORMAL LOW (ref 39.0–52.0)
Hemoglobin: 9.7 g/dL — ABNORMAL LOW (ref 13.0–17.0)
Immature Granulocytes: 0 %
Lymphocytes Relative: 22 %
Lymphs Abs: 1.1 K/uL (ref 0.7–4.0)
MCH: 24 pg — ABNORMAL LOW (ref 26.0–34.0)
MCHC: 31.6 g/dL (ref 30.0–36.0)
MCV: 76 fL — ABNORMAL LOW (ref 80.0–100.0)
Monocytes Absolute: 0.3 K/uL (ref 0.1–1.0)
Monocytes Relative: 7 %
Neutro Abs: 3.5 K/uL (ref 1.7–7.7)
Neutrophils Relative %: 70 %
Platelets: 458 K/uL — ABNORMAL HIGH (ref 150–400)
RBC: 4.04 MIL/uL — ABNORMAL LOW (ref 4.22–5.81)
RDW: 15.9 % — ABNORMAL HIGH (ref 11.5–15.5)
WBC: 5 K/uL (ref 4.0–10.5)
nRBC: 0 % (ref 0.0–0.2)

## 2019-10-28 LAB — HEPATITIS B SURFACE ANTIGEN: Hepatitis B Surface Ag: NONREACTIVE

## 2019-10-28 LAB — HEPATITIS B CORE ANTIBODY, TOTAL: Hep B Core Total Ab: REACTIVE — AB

## 2019-10-28 MED ORDER — PALONOSETRON HCL INJECTION 0.25 MG/5ML
0.2500 mg | Freq: Once | INTRAVENOUS | Status: AC
  Administered 2019-10-28: 0.25 mg via INTRAVENOUS
  Filled 2019-10-28: qty 5

## 2019-10-28 MED ORDER — METHYLPREDNISOLONE SODIUM SUCC 125 MG IJ SOLR
125.0000 mg | Freq: Once | INTRAMUSCULAR | Status: AC
  Administered 2019-10-28: 125 mg via INTRAVENOUS

## 2019-10-28 MED ORDER — SODIUM CHLORIDE 0.9 % IV SOLN
Freq: Once | INTRAVENOUS | Status: AC
  Filled 2019-10-28: qty 250

## 2019-10-28 MED ORDER — FAMOTIDINE IN NACL 20-0.9 MG/50ML-% IV SOLN
20.0000 mg | Freq: Once | INTRAVENOUS | Status: AC | PRN
  Administered 2019-10-28: 20 mg via INTRAVENOUS

## 2019-10-28 MED ORDER — FAMOTIDINE IN NACL 20-0.9 MG/50ML-% IV SOLN
20.0000 mg | Freq: Once | INTRAVENOUS | Status: AC
  Administered 2019-10-28: 20 mg via INTRAVENOUS
  Filled 2019-10-28: qty 50

## 2019-10-28 MED ORDER — SODIUM CHLORIDE 0.9 % IV SOLN
214.6000 mg | Freq: Once | INTRAVENOUS | Status: AC
  Administered 2019-10-28: 210 mg via INTRAVENOUS
  Filled 2019-10-28: qty 21

## 2019-10-28 MED ORDER — HEPARIN SOD (PORK) LOCK FLUSH 100 UNIT/ML IV SOLN
INTRAVENOUS | Status: AC
  Filled 2019-10-28: qty 5

## 2019-10-28 MED ORDER — SODIUM CHLORIDE 0.9 % IV SOLN
45.0000 mg/m2 | Freq: Once | INTRAVENOUS | Status: AC
  Administered 2019-10-28: 90 mg via INTRAVENOUS
  Filled 2019-10-28: qty 15

## 2019-10-28 MED ORDER — DIPHENHYDRAMINE HCL 50 MG/ML IJ SOLN
25.0000 mg | Freq: Once | INTRAMUSCULAR | Status: AC
  Administered 2019-10-28: 25 mg via INTRAVENOUS

## 2019-10-28 MED ORDER — DIPHENHYDRAMINE HCL 50 MG/ML IJ SOLN
50.0000 mg | Freq: Once | INTRAMUSCULAR | Status: AC
  Administered 2019-10-28: 50 mg via INTRAVENOUS
  Filled 2019-10-28: qty 1

## 2019-10-28 MED ORDER — HEPARIN SOD (PORK) LOCK FLUSH 100 UNIT/ML IV SOLN
500.0000 [IU] | Freq: Once | INTRAVENOUS | Status: AC | PRN
  Administered 2019-10-28: 500 [IU]
  Filled 2019-10-28: qty 5

## 2019-10-28 MED ORDER — SODIUM CHLORIDE 0.9 % IV SOLN
Freq: Once | INTRAVENOUS | Status: DC | PRN
  Filled 2019-10-28: qty 250

## 2019-10-28 MED ORDER — SODIUM CHLORIDE 0.9 % IV SOLN
20.0000 mg | Freq: Once | INTRAVENOUS | Status: AC
  Administered 2019-10-28: 20 mg via INTRAVENOUS
  Filled 2019-10-28: qty 20

## 2019-10-28 NOTE — Progress Notes (Addendum)
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 He underwent CT angios chest which did not show any PE. Soft tissue attenuation measuring 2.9 x 4 cm centered in the left hilum resulting in abrupt angulation and narrowing of the pulmonary arteries and the central left lower lobe airways. Additional ipsilateral hilar and subcarinal adenopathy. No contralateral adenopathy. Consolidative masslike opacity 3.6 x 3.1 cm in size and contiguous with more central perihilar soft tissue attenuation. Overall findings concerning for primary bronchogenic carcinoma. Patient referred for further management.Patient was discharged on oral Augmentin  Patient lives with his sister who is his main caregiver. He does not drive but is independent of his ADLs. Reports ongoing fatigue and occasional retrosternal chest pain. He has been evaluated by GI in the past for reflux as well. Appetieis fair and weight is stable.Denies any significant shortness of breath at this time.Patient is an ex-smoker and smoked for about 20 years but quit smoking back in 1994  PET CT scan showed hypermetabolic mass in the left lower lobe with mediastinal adenopathy and right paratracheal adenopathy.  No evidence of distant metastatic disease.  Pathology was consistent with  non-small cell lung cancer favor adenocarcinoma.  Cells were positive for TTF-1 and negative for p40.  Plan is for concurrent chemoradiation with weekly carbotaxol followed by maintenance durvalumab  Interval history-patient had some chest wall pain following port insertion which has presently resolved.  He denies any specific complaints at this time  ECOG PS- 1 Pain scale- 0 Opioid associated constipation- no  Review of systems- Review of Systems  Constitutional: Positive for malaise/fatigue. Negative for chills, fever and weight loss.  HENT: Negative for congestion, ear discharge and nosebleeds.   Eyes: Negative for blurred vision.  Respiratory: Negative for cough, hemoptysis, sputum production, shortness of breath and wheezing.   Cardiovascular: Negative for chest pain, palpitations, orthopnea and claudication.  Gastrointestinal: Negative for abdominal pain, blood in stool, constipation, diarrhea, heartburn, melena, nausea and vomiting.  Genitourinary: Negative for dysuria, flank pain, frequency, hematuria and urgency.  Musculoskeletal: Negative for back pain, joint pain and myalgias.  Skin: Negative for rash.  Neurological: Negative for dizziness, tingling, focal weakness, seizures, weakness and headaches.  Endo/Heme/Allergies: Does not bruise/bleed easily.  Psychiatric/Behavioral: Negative for depression and suicidal ideas. The patient does not have insomnia.       No Known Allergies   Past Medical History:  Diagnosis Date  . Dyspnea   . Hyperlipidemia   . Hypertension      Past Surgical History:  Procedure Laterality Date  . PORTA CATH INSERTION N/A 10/22/2019   Procedure: PORTA CATH INSERTION;  Surgeon: Katha Cabal, MD;  Location: [facility];  Service: Cardiovascular;  Laterality: N/A;  . VIDEO BRONCHOSCOPY WITH ENDOBRONCHIAL ULTRASOUND N/A 09/25/2019   Procedure: VIDEO BRONCHOSCOPY WITH ENDOBRONCHIAL ULTRASOUND;  Surgeon: Tyler Pita, MD;   Location: [facility];  Service: Pulmonary;  Laterality: N/A;    Social History   Socioeconomic History  . Marital status: Single    Spouse name: Not on file  . Number of children: Not on file  . Years of education: Not on file  . Highest education level: Not on file  Occupational History  . Not on file  Tobacco Use  . Smoking status: Former Smoker    Packs/day: 1.00    Years: 20.00    Pack years: 20.00    Types: Cigarettes    Quit date: 1994    Years since quitting: 27.7  . Smokeless tobacco: Never Used  Vaping Use  . Vaping Use: Never used  Substance and Sexual Activity  . Alcohol use: Yes    Comment: occ  . Drug use: Never  . Sexual activity: Not on file  Other Topics Concern  . Not on file  Social History Narrative  . Not on file   Social Determinants of Health   Financial Resource Strain:   . Difficulty of Paying Living Expenses: Not on file  Food Insecurity:   . Worried About Charity fundraiser in the Last Year: Not on file  . Ran Out of Food in the Last Year: Not on file  Transportation Needs:   . Lack of Transportation (Medical): Not on file  . Lack of Transportation (Non-Medical): Not on file  Physical Activity:   . Days of Exercise per Week: Not on file  . Minutes of Exercise per Session: Not on file  Stress:   . Feeling of Stress : Not on file  Social Connections:   . Frequency of Communication with Friends and Family: Not on file  . Frequency of Social Gatherings with Friends and Family: Not on file  . Attends Religious Services: Not on file  . Active Member of Clubs or Organizations: Not on file  . Attends Archivist Meetings: Not on file  . Marital Status: Not on file  Intimate Partner Violence:   . Fear of Current or Ex-Partner: Not on file  . Emotionally Abused: Not on file  . Physically Abused: Not on file  . Sexually Abused: Not on file    Family History  Problem Relation Age of Onset  . Cancer Brother      Current  Outpatient Medications:  .  acetaminophen (TYLENOL) 500 MG tablet, Take 1,000 mg by mouth every 6 (six) hours as needed for mild pain, moderate pain or headache., Disp: , Rfl:  .  albuterol (VENTOLIN HFA) 108 (90 Base) MCG/ACT inhaler, Inhale 2 puffs into the lungs every 4 (four) hours as needed for wheezing or shortness of breath. , Disp: , Rfl:  .  ANORO ELLIPTA 62.5-25 MCG/INH AEPB, Inhale 1 puff into the lungs daily. , Disp: , Rfl:  .  benzonatate (TESSALON PERLES) 100 MG capsule, Take 1 capsule (100 mg total) by mouth every 6 (six) hours as needed for cough., Disp: 90 capsule, Rfl: 0 .  gabapentin (NEURONTIN) 100 MG capsule, TAKE 1 CAPSULE(100 MG) BY MOUTH THREE TIMES DAILY, Disp: 90 capsule, Rfl: 0 .  oxyCODONE (OXY IR/ROXICODONE) 5 MG immediate release tablet, Take 1-2 tablets (5-10 mg total) by mouth every 4 (four) hours as needed for severe pain., Disp: 90 tablet, Rfl: 0 .  dexamethasone (DECADRON) 4 MG tablet, Take 2 tablets (8 mg total) by mouth daily. Start the day after chemotherapy for 2 days. (Patient not taking: Reported on 10/22/2019), Disp: 30 tablet, Rfl: 1 .  lidocaine-prilocaine (EMLA) cream,  Apply to affected area once (Patient not taking: Reported on 10/17/2019), Disp: 30 g, Rfl: 3 .  LORazepam (ATIVAN) 0.5 MG tablet, Take 1 tablet (0.5 mg total) by mouth every 6 (six) hours as needed (Nausea or vomiting). (Patient not taking: Reported on 10/17/2019), Disp: 30 tablet, Rfl: 0 .  ondansetron (ZOFRAN) 8 MG tablet, Take 1 tablet (8 mg total) by mouth 2 (two) times daily as needed for refractory nausea / vomiting. Start on day 3 after chemo. (Patient not taking: Reported on 10/17/2019), Disp: 30 tablet, Rfl: 1 .  pantoprazole (PROTONIX) 40 MG tablet, Take 1 tablet (40 mg total) by mouth daily. (Patient not taking: Reported on 10/28/2019), Disp: 30 tablet, Rfl: 3 .  prochlorperazine (COMPAZINE) 10 MG tablet, TAKE 1 TABLET(10 MG) BY MOUTH EVERY 6 HOURS AS NEEDED FOR NAUSEA OR VOMITING (Patient not  taking: Reported on 10/22/2019), Disp: 30 tablet, Rfl: 1 No current facility-administered medications for this visit.  Facility-Administered Medications Ordered in Other Visits:  .  0.9 %  sodium chloride infusion, , Intravenous, Once PRN, Sindy Guadeloupe, MD, Last Rate: 999 mL/hr at 10/28/19 1135, New Bag at 10/28/19 1135 .  CARBOplatin (PARAPLATIN) 210 mg in sodium chloride 0.9 % 250 mL chemo infusion, 210 mg, Intravenous, Once, Randa Evens C, MD .  heparin lock flush 100 unit/mL, 500 Units, Intracatheter, Once PRN, Sindy Guadeloupe, MD  Physical exam:  Vitals:   10/28/19 0859  BP: 130/79  Pulse: 86  Temp: (!) 97.3 F (36.3 C)  TempSrc: Tympanic  SpO2: 99%  Weight: 187 lb 8 oz (85 kg)   Physical Exam Constitutional:      General: He is not in acute distress. HENT:     Head: Normocephalic and atraumatic.  Eyes:     Pupils: Pupils are equal, round, and reactive to light.  Cardiovascular:     Rate and Rhythm: Normal rate and regular rhythm.     Heart sounds: Normal heart sounds.  Pulmonary:     Effort: Pulmonary effort is normal.     Breath sounds: Normal breath sounds.  Abdominal:     General: Bowel sounds are normal.     Palpations: Abdomen is soft.  Musculoskeletal:     Cervical back: Normal range of motion.  Skin:    General: Skin is warm and dry.  Neurological:     Mental Status: He is alert and oriented to person, place, and time.      CMP Latest Ref Rng & Units 10/28/2019  Glucose 70 - 99 mg/dL 120(H)  BUN 8 - 23 mg/dL 15  Creatinine 0.61 - 1.24 mg/dL 0.70  Sodium 135 - 145 mmol/L 141  Potassium 3.5 - 5.1 mmol/L 3.7  Chloride 98 - 111 mmol/L 107  CO2 22 - 32 mmol/L 26  Calcium 8.9 - 10.3 mg/dL 8.9  Total Protein 6.5 - 8.1 g/dL 7.7  Total Bilirubin 0.3 - 1.2 mg/dL 0.4  Alkaline Phos 38 - 126 U/L 46  AST 15 - 41 U/L 17  ALT 0 - 44 U/L 13   CBC Latest Ref Rng & Units 10/28/2019  WBC 4.0 - 10.5 K/uL 5.0  Hemoglobin 13.0 - 17.0 g/dL 9.7(L)  Hematocrit 39 - 52  % 30.7(L)  Platelets 150 - 400 K/uL 458(H)    No images are attached to the encounter.  CT Head W Wo Contrast  Result Date: 10/10/2019 CLINICAL DATA:  69 year old male with recently diagnosed non-small cell lung cancer. Headaches x1 month. Staging. EXAM: CT HEAD  WITHOUT AND WITH CONTRAST TECHNIQUE: Contiguous axial images were obtained from the base of the skull through the vertex without and with intravenous contrast CONTRAST:  31mL OMNIPAQUE IOHEXOL 300 MG/ML  SOLN COMPARISON:  PET-CT 09/16/2019 FINDINGS: Brain: No intracranial mass effect or midline shift. No ventriculomegaly. No acute intracranial hemorrhage identified. No cortically based acute infarct identified. Patchy and confluent bilateral periventricular and other cerebral white matter hypodensity. Some involvement of the deep white matter capsules. No cortical encephalomalacia identified. No abnormal enhancement identified. Vascular: The major intracranial vascular structures are enhancing as expected. Skull: Negative. Sinuses/Orbits: Visualized paranasal sinuses and mastoids are clear. Other: Visualized orbits and scalp soft tissues are within normal limits. IMPRESSION: 1. No metastatic disease or acute intracranial abnormality identified. 2. 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 Electronically Signed   By: Genevie Ann M.D.   On: 10/10/2019 16:18   PERIPHERAL VASCULAR CATHETERIZATION  Result Date: 10/22/2019 See op note    Assessment and plan- Patient is a 69 y.o. 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  He is here for on treatment assessment prior to cycle 1 of weekly carbotaxol chemotherapy  Counts okay to proceed with cycle 1 of weekly carbotaxol chemotherapy today.  Again discussed risks and benefits of chemotherapy including all but not limited to nausea, vomiting, low blood counts, risk of infections and hospitalization.  Risk of peripheral  neuropathy and infusion reaction associated with Taxol.  Treatment will be given with a curative intent.  Patient understands and agrees to proceed as planned.  I will see him back in 1 week for cycle 2.  Plan is for repeat scans after completing carbotaxol chemotherapy with radiation  Microcytic anemia: We will repeat ferritin and iron studies B12 and folate with the next set of labs   Visit Diagnosis 1. Encounter for antineoplastic chemotherapy   2. Malignant neoplasm of lower lobe of left lung ([place])   3. Goals of care, counseling/discussion      Dr. Randa Evens, MD, MPH [facility] [phone] 10/28/2019 1:16 PM  Addendum: When patient was receiving Taxol chemotherapy he developed intense back pain and severe bout of coughing as well as chest wall pain.  Vitals remained stable.  Chemo was aborted.  Plan is to switch him to [place] Abraxane at this time.  He was given Solu-Medrol and Benadryl.

## 2019-10-28 NOTE — Patient Instructions (Signed)
The type of lung cancer that you have is called NON-SMALL CELL CARCINOMA, FAVOR ADENOCARCINOMA.

## 2019-10-28 NOTE — Progress Notes (Signed)
  Oncology Nurse Navigator Documentation  Navigator Location: [facility]Med Onc (10/28/19 0900)   )Navigator Encounter Type: Follow-up Appt;Treatment (10/28/19 0900)                   Treatment Initiated Date: 10/22/19 (10/28/19 0900) Patient Visit Type: MedOnc (10/28/19 0900) Treatment Phase: First Radiation Tx (10/28/19 0900) Barriers/Navigation Needs: No Needs (10/28/19 0900)   Interventions: None Required (10/28/19 0900)         met with patient and his sister during follow up visit with Dr. Janese Banks to start chemotherapy treatments. All questions answered during visit. Reviewed upcoming appts with patient. Nothing further needed at this time. Instructed to call with any further questions or needs. Pt and his sister verbalized understanding.             Time Spent with Patient: 30 (10/28/19 0900)

## 2019-10-28 NOTE — Progress Notes (Addendum)
At 1135, as the patient was coming out of the bathroom he started to complain of back and hip pain. Patient was just receiving first time Taxol at the time. Taxol was stopped completly and patient was assisted back to his chair.  NS bolus started at 1135.  At 1136, pain in back was increasing and 25mg  of benadryl and 125 mg of solumedrol was given. Lauren, NP came to assess patient at chairside.  At 1139, patient began coughing with some wheezing. Another dose of 125 mg solumedrol was given with Pepcid at 1140.  At 1142 chest pain began. Vital signs was 123/79 HR 108 o2 99% RR 35.  At 1146, chest pain began to decrease and vital signs was stable. Per NP, continue NS bolus and monitor patient until all symptoms are gone.  At 1205, vitals was 140/80, HR 82, O2:100% on room air. Patient resting comfortably. Chest pain is continuing to decrease. Will continue to monitor closely.

## 2019-10-28 NOTE — Addendum Note (Signed)
Addended by: Randa Evens C on: 10/28/2019 01:25 PM   Modules accepted: Orders

## 2019-10-28 NOTE — Progress Notes (Signed)
Nutrition Assessment   Reason for Assessment:  Referral from [place], NP, poor appetite   ASSESSMENT:  69 year old male with adenocarcinoma of right lung.  Past medical history of HTN, HLD, smoker, etoh use.  Patient receiving concurrent chemotherapy and radiation therapy.    Met with patient today in infusion.  Patient reports that he lives with his sister.  She helps prepare meals for him.  Reports that he does not have much of an appetite and has been like that for about a month.  States that sometimes he only eats 1 time per day.  Likes sandwiches, potato chips, candy bar, egg and cheese, red hots, grits, pork and beans.  Drinks juice soda and water.     Medications: zofran, compazine, ativan, prilosec, decadron   Labs: glucose 120   Anthropometrics:   Height: 70 inches Weight: 187 lb today UBW: 197 lb in July 2021 BMI: 26  5% weight loss in the last month and half, concerning   Estimated Energy Needs  Kcals: 1103-1594 Protein: 95-106 g Fluid: 1.9 L   NUTRITION DIAGNOSIS: Inadequate oral intake related to cancer as evidenced by 5% weight loss in the last month and half and poor appetite   INTERVENTION:  Discussed importance of nutrition during treatment.  Encouraged frequent small mini meals during the day.   Discussed ways to add calories and protein.  Handout provided. Additional samples of oral nutrition supplements provided to patient today.  Contact information given   MONITORING, EVALUATION, GOAL: weight trends, intake   Next Visit: October 4 during infusion  Yenty Bloch B. Zenia Resides, [place], [place] Registered Dietitian [phone] (mobile)

## 2019-10-28 NOTE — Progress Notes (Signed)
[place]  [phone] [phone]   Name: Mitchell Frye Date: 10/28/2019 MRN: [id number]  DOB: 07/12/1950  Patient Care Team: Center, [facility] as PCP - Lillia Mountain, [place], RN as Oncology Nurse Navigator    REASON FOR CONSULTATION: Mitchell Frye is a 69 y.o. 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  PET/CT showed hypermetabolic mass in the left lower lobe with mediastinal and right paratracheal adenopathy.  Pathology was consistent with adenocarcinoma.  Patient was started on concurrent chemoradiation with plan for maintenance immunotherapy.  He was referred to palliative care to help address goals and manage ongoing symptoms.  SOCIAL HISTORY:     reports that he quit smoking about 27 years ago. His smoking use included cigarettes. He has a 20.00 pack-year smoking history. He has never used smokeless tobacco. He reports current alcohol use. He reports that he does not use drugs.  Patient lives at home with his sister who is his primary caregiver.  He does not drive.  ADVANCE DIRECTIVES:  Does not have  CODE STATUS:   PAST MEDICAL HISTORY: Past Medical History:  Diagnosis Date  . Dyspnea   . Hyperlipidemia   . Hypertension     PAST SURGICAL HISTORY:  Past Surgical History:  Procedure Laterality Date  . PORTA CATH INSERTION N/A 10/22/2019   Procedure: PORTA CATH INSERTION;  Surgeon: Katha Cabal, MD;  Location: [facility];  Service: Cardiovascular;  Laterality: N/A;  . VIDEO BRONCHOSCOPY WITH ENDOBRONCHIAL ULTRASOUND N/A 09/25/2019   Procedure: VIDEO BRONCHOSCOPY WITH ENDOBRONCHIAL ULTRASOUND;  Surgeon: Tyler Pita, MD;  Location: [facility];  Service: Pulmonary;  Laterality: N/A;    HEMATOLOGY/ONCOLOGY HISTORY:  Oncology History  Malignant neoplasm of  lower lobe of left lung ([place])  10/04/2019 Initial Diagnosis   Malignant neoplasm of lower lobe of left lung ([place])   10/04/2019 Cancer Staging   Staging form: Lung, AJCC 8th Edition - Clinical stage from 10/04/2019: Stage IIIB (cT2b, cN3, cM0) - Signed by Sindy Guadeloupe, MD on 10/04/2019   10/28/2019 -  Chemotherapy   The patient had dexamethasone (DECADRON) 4 MG tablet, 8 mg, Oral, Daily, 1 of 1 cycle, Start date: 10/04/2019, End date: -- palonosetron (ALOXI) injection 0.25 mg, 0.25 mg, Intravenous,  Once, 1 of 4 cycles Administration: 0.25 mg (10/28/2019) PACLitaxel-protein bound (ABRAXANE) chemo infusion 200 mg, 100 mg/m2 = 200 mg (100 % of original dose 100 mg/m2), Intravenous,  Once, 0 of 3 cycles Dose modification: 100 mg/m2 (original dose 100 mg/m2, Cycle 2) CARBOplatin (PARAPLATIN) 210 mg in sodium chloride 0.9 % 250 mL chemo infusion, 210 mg (100 % of original dose 214.6 mg), Intravenous,  Once, 1 of 4 cycles Dose modification:   (original dose 214.6 mg, Cycle 1) PACLitaxel (TAXOL) 90 mg in sodium chloride 0.9 % 250 mL chemo infusion (</= 80mg /m2), 45 mg/m2 = 90 mg, Intravenous,  Once, 1 of 1 cycle Administration: 90 mg (10/28/2019)  for chemotherapy treatment.      ALLERGIES:  has No Known Allergies.  MEDICATIONS:  Current Outpatient Medications  Medication Sig Dispense Refill  . acetaminophen (TYLENOL) 500 MG tablet Take 1,000 mg by mouth every 6 (six) hours as needed for mild pain, moderate pain or headache.    . albuterol (VENTOLIN HFA) 108 (90 Base) MCG/ACT inhaler Inhale 2 puffs into the lungs every 4 (four) hours as needed for  wheezing or shortness of breath.     Jearl Klinefelter ELLIPTA 62.5-25 MCG/INH AEPB Inhale 1 puff into the lungs daily.     . benzonatate (TESSALON PERLES) 100 MG capsule Take 1 capsule (100 mg total) by mouth every 6 (six) hours as needed for cough. 90 capsule 0  . dexamethasone (DECADRON) 4 MG tablet Take 2 tablets (8 mg total) by mouth daily. Start the day after  chemotherapy for 2 days. (Patient not taking: Reported on 10/22/2019) 30 tablet 1  . gabapentin (NEURONTIN) 100 MG capsule TAKE 1 CAPSULE(100 MG) BY MOUTH THREE TIMES DAILY 90 capsule 0  . lidocaine-prilocaine (EMLA) cream Apply to affected area once (Patient not taking: Reported on 10/17/2019) 30 g 3  . LORazepam (ATIVAN) 0.5 MG tablet Take 1 tablet (0.5 mg total) by mouth every 6 (six) hours as needed (Nausea or vomiting). (Patient not taking: Reported on 10/17/2019) 30 tablet 0  . ondansetron (ZOFRAN) 8 MG tablet Take 1 tablet (8 mg total) by mouth 2 (two) times daily as needed for refractory nausea / vomiting. Start on day 3 after chemo. (Patient not taking: Reported on 10/17/2019) 30 tablet 1  . oxyCODONE (OXY IR/ROXICODONE) 5 MG immediate release tablet Take 1-2 tablets (5-10 mg total) by mouth every 4 (four) hours as needed for severe pain. 90 tablet 0  . pantoprazole (PROTONIX) 40 MG tablet Take 1 tablet (40 mg total) by mouth daily. (Patient not taking: Reported on 10/28/2019) 30 tablet 3  . prochlorperazine (COMPAZINE) 10 MG tablet TAKE 1 TABLET(10 MG) BY MOUTH EVERY 6 HOURS AS NEEDED FOR NAUSEA OR VOMITING (Patient not taking: Reported on 10/22/2019) 30 tablet 1   No current facility-administered medications for this visit.   Facility-Administered Medications Ordered in Other Visits  Medication Dose Route Frequency Provider Last Rate Last Admin  . 0.9 %  sodium chloride infusion   Intravenous Once PRN Sindy Guadeloupe, MD 999 mL/hr at 10/28/19 1135 New Bag at 10/28/19 1135  . heparin lock flush 100 unit/mL  500 Units Intracatheter Once PRN Sindy Guadeloupe, MD        VITAL SIGNS: There were no vitals taken for this visit. There were no vitals filed for this visit.  Estimated body mass index is 26.9 kg/m as calculated from the following:   Height as of 10/17/19: 5\' 10"  (1.778 m).   Weight as of an earlier encounter on 10/28/19: 187 lb 8 oz (85 kg).  LABS: CBC:    Component Value Date/Time   WBC  5.0 10/28/2019 0858   HGB 9.7 (L) 10/28/2019 0858   HCT 30.7 (L) 10/28/2019 0858   PLT 458 (H) 10/28/2019 0858   MCV 76.0 (L) 10/28/2019 0858   NEUTROABS 3.5 10/28/2019 0858   LYMPHSABS 1.1 10/28/2019 0858   MONOABS 0.3 10/28/2019 0858   EOSABS 0.1 10/28/2019 0858   BASOSABS 0.0 10/28/2019 0858   Comprehensive Metabolic Panel:    Component Value Date/Time   NA 141 10/28/2019 0858   K 3.7 10/28/2019 0858   CL 107 10/28/2019 0858   CO2 26 10/28/2019 0858   BUN 15 10/28/2019 0858   CREATININE 0.70 10/28/2019 0858   GLUCOSE 120 (H) 10/28/2019 0858   CALCIUM 8.9 10/28/2019 0858   AST 17 10/28/2019 0858   ALT 13 10/28/2019 0858   ALKPHOS 46 10/28/2019 0858   BILITOT 0.4 10/28/2019 0858   PROT 7.7 10/28/2019 0858   ALBUMIN 3.0 (L) 10/28/2019 0858    RADIOGRAPHIC STUDIES: CT Head W Wo Contrast  Result Date: 10/10/2019 CLINICAL DATA:  69 year old male with recently diagnosed non-small cell lung cancer. Headaches x1 month. Staging. EXAM: CT HEAD WITHOUT AND WITH CONTRAST TECHNIQUE: Contiguous axial images were obtained from the base of the skull through the vertex without and with intravenous contrast CONTRAST:  4mL OMNIPAQUE IOHEXOL 300 MG/ML  SOLN COMPARISON:  PET-CT 09/16/2019 FINDINGS: Brain: No intracranial mass effect or midline shift. No ventriculomegaly. No acute intracranial hemorrhage identified. No cortically based acute infarct identified. Patchy and confluent bilateral periventricular and other cerebral white matter hypodensity. Some involvement of the deep white matter capsules. No cortical encephalomalacia identified. No abnormal enhancement identified. Vascular: The major intracranial vascular structures are enhancing as expected. Skull: Negative. Sinuses/Orbits: Visualized paranasal sinuses and mastoids are clear. Other: Visualized orbits and scalp soft tissues are within normal limits. IMPRESSION: 1. No metastatic disease or acute intracranial abnormality identified. 2.  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 Electronically Signed   By: Genevie Ann M.D.   On: 10/10/2019 16:18   PERIPHERAL VASCULAR CATHETERIZATION  Result Date: 10/22/2019 See op note   PERFORMANCE STATUS (ECOG) : 1  Review of Systems Unless otherwise noted, a complete review of systems is negative.  Physical Exam General: NAD Pulmonary: Unlabored Extremities: no edema, no joint deformities Skin: no rashes Neurological: Weakness but otherwise nonfocal  IMPRESSION: Patient seen in infusion area.  Patient denies any significant changes or concerns.  He reports improved pain.  He denies any other symptomatic complaints at present.  Patient continues to live with his sister.  He says things are going well at home.  Performance status is stable.  Oral intake is reportedly good.  He did appear to have a reaction to Taxol and treatment was adjusted accordingly.  Patient does not have any advance directives.  I sent him home with ACP documents and a MOST form to review.  PLAN: -Continue current scope of treatment -Continue oxycodone as needed -Continue Protonix 40 mg daily -ACP and MOST form reviewed -RTC in 3 to 4 weeks   Patient expressed understanding and was in agreement with this plan. He also understands that He can call the clinic at any time with any questions, concerns, or complaints.     Time Total: 15 minutes  Visit consisted of counseling and education dealing with the complex and emotionally intense issues of symptom management and palliative care in the setting of serious and potentially life-threatening illness.Greater than 50%  of this time was spent counseling and coordinating care related to the above assessment and plan.  Signed by: Altha Harm, PhD, NP-C

## 2019-10-28 NOTE — Progress Notes (Signed)
1330- Chest pain stopped completely and was feeling back to normal. Per Dr. Janese Banks patient should receive Carboplatin today and Abraxane tomorrow. Patient agrees and verbalizes understanding.

## 2019-10-29 ENCOUNTER — Ambulatory Visit
Admission: RE | Admit: 2019-10-29 | Discharge: 2019-10-29 | Disposition: A | Discharge status: Home / Self Care | Payer: Medicaid Other | Source: Ambulatory Visit | Attending: Radiation Oncology | Admitting: Radiation Oncology

## 2019-10-29 ENCOUNTER — Other Ambulatory Visit: Payer: Self-pay | Admitting: Oncology

## 2019-10-29 ENCOUNTER — Inpatient Hospital Stay: Payer: Medicaid Other

## 2019-10-29 VITALS — BP 152/77 | HR 53 | Temp 98.1°F | Resp 22

## 2019-10-29 DIAGNOSIS — Z5111 Encounter for antineoplastic chemotherapy: Secondary | ICD-10-CM | POA: Diagnosis not present

## 2019-10-29 DIAGNOSIS — C3432 Malignant neoplasm of lower lobe, left bronchus or lung: Secondary | ICD-10-CM

## 2019-10-29 DIAGNOSIS — Z510 Encounter for antineoplastic radiation therapy: Secondary | ICD-10-CM | POA: Diagnosis not present

## 2019-10-29 LAB — HEPATITIS B SURFACE ANTIBODY, QUANTITATIVE: Hep B S AB Quant (Post): 68.1 m[IU]/mL (ref >9.9)

## 2019-10-29 MED ORDER — SODIUM CHLORIDE 0.9 % IV SOLN
Freq: Once | INTRAVENOUS | Status: AC
  Filled 2019-10-29: qty 250

## 2019-10-29 MED ORDER — HEPARIN SOD (PORK) LOCK FLUSH 100 UNIT/ML IV SOLN
INTRAVENOUS | Status: AC
  Filled 2019-10-29: qty 5

## 2019-10-29 MED ORDER — SODIUM CHLORIDE 0.9 % IV SOLN
10.0000 mg | Freq: Once | INTRAVENOUS | Status: AC
  Administered 2019-10-29: 10 mg via INTRAVENOUS
  Filled 2019-10-29: qty 10

## 2019-10-29 MED ORDER — PACLITAXEL PROTEIN-BOUND CHEMO INJECTION 100 MG
100.0000 mg/m2 | Freq: Once | INTRAVENOUS | Status: AC
  Administered 2019-10-29: 200 mg via INTRAVENOUS
  Filled 2019-10-29: qty 40

## 2019-10-29 MED ORDER — HEPARIN SOD (PORK) LOCK FLUSH 100 UNIT/ML IV SOLN
500.0000 [IU] | Freq: Once | INTRAVENOUS | Status: AC | PRN
  Administered 2019-10-29: 500 [IU]
  Filled 2019-10-29: qty 5

## 2019-10-29 NOTE — Interval H&P Note (Signed)
History and Physical Interval Note:  10/29/2019 8:09 AM  Mitchell Frye  has presented today for surgery, with the diagnosis of Porta Cath Placement   Lung Ca Covid  Sept 2.  The various methods of treatment have been discussed with the patient and family. After consideration of risks, benefits and other options for treatment, the patient has consented to  Procedure(s): PORTA CATH INSERTION (N/A) as a surgical intervention.  The patient's history has been reviewed, patient examined, no change in status, stable for surgery.  I have reviewed the patient's chart and labs.  Questions were answered to the patient's satisfaction.     Hortencia Pilar

## 2019-10-29 NOTE — Progress Notes (Signed)
Patient had reaction to taxol 10/28/19.  STopped and d/c.  Patient did get [place].  Will change to abraxane and patient to get on 10/29/19.

## 2019-10-30 ENCOUNTER — Ambulatory Visit
Admission: RE | Admit: 2019-10-30 | Discharge: 2019-10-30 | Disposition: A | Discharge status: Home / Self Care | Payer: Medicaid Other | Source: Ambulatory Visit | Attending: Radiation Oncology | Admitting: Radiation Oncology

## 2019-10-30 ENCOUNTER — Telehealth: Payer: Self-pay

## 2019-10-30 DIAGNOSIS — Z510 Encounter for antineoplastic radiation therapy: Secondary | ICD-10-CM | POA: Diagnosis not present

## 2019-10-30 NOTE — Telephone Encounter (Signed)
T/C to pt for follow up after receiving first chemo.   No answer but left message letting him know we were calling to check on him.   Encouraged pt to call for any questions or concerns.

## 2019-10-31 ENCOUNTER — Ambulatory Visit
Admission: RE | Admit: 2019-10-31 | Discharge: 2019-10-31 | Disposition: A | Discharge status: Home / Self Care | Payer: Medicaid Other | Source: Ambulatory Visit | Attending: Radiation Oncology | Admitting: Radiation Oncology

## 2019-10-31 DIAGNOSIS — Z510 Encounter for antineoplastic radiation therapy: Secondary | ICD-10-CM | POA: Diagnosis not present

## 2019-11-01 ENCOUNTER — Ambulatory Visit
Admission: RE | Admit: 2019-11-01 | Discharge: 2019-11-01 | Disposition: A | Discharge status: Home / Self Care | Payer: Medicaid Other | Source: Ambulatory Visit | Attending: Radiation Oncology | Admitting: Radiation Oncology

## 2019-11-01 DIAGNOSIS — Z510 Encounter for antineoplastic radiation therapy: Secondary | ICD-10-CM | POA: Diagnosis not present

## 2019-11-04 ENCOUNTER — Ambulatory Visit
Admission: RE | Admit: 2019-11-04 | Discharge: 2019-11-04 | Disposition: A | Discharge status: Home / Self Care | Payer: Medicaid Other | Source: Ambulatory Visit | Attending: Radiation Oncology | Admitting: Radiation Oncology

## 2019-11-04 ENCOUNTER — Inpatient Hospital Stay (HOSPITAL_BASED_OUTPATIENT_CLINIC_OR_DEPARTMENT_OTHER): Payer: Medicaid Other | Admitting: Oncology

## 2019-11-04 ENCOUNTER — Encounter: Payer: Self-pay | Admitting: Oncology

## 2019-11-04 ENCOUNTER — Inpatient Hospital Stay: Payer: Medicaid Other

## 2019-11-04 ENCOUNTER — Other Ambulatory Visit: Payer: Self-pay

## 2019-11-04 VITALS — BP 147/93 | HR 73 | Temp 96.3°F | Resp 18 | Wt 185.9 lb

## 2019-11-04 DIAGNOSIS — C3432 Malignant neoplasm of lower lobe, left bronchus or lung: Secondary | ICD-10-CM | POA: Diagnosis not present

## 2019-11-04 DIAGNOSIS — Z510 Encounter for antineoplastic radiation therapy: Secondary | ICD-10-CM | POA: Diagnosis not present

## 2019-11-04 DIAGNOSIS — Z5111 Encounter for antineoplastic chemotherapy: Secondary | ICD-10-CM | POA: Diagnosis not present

## 2019-11-04 DIAGNOSIS — R12 Heartburn: Secondary | ICD-10-CM

## 2019-11-04 DIAGNOSIS — E538 Deficiency of other specified B group vitamins: Secondary | ICD-10-CM

## 2019-11-04 LAB — CBC WITH DIFFERENTIAL/PLATELET
Abs Immature Granulocytes: 0.07 10*3/uL (ref 0.00–0.07)
Basophils Absolute: 0 10*3/uL (ref 0.0–0.1)
Basophils Relative: 0 %
Eosinophils Absolute: 0 10*3/uL (ref 0.0–0.5)
Eosinophils Relative: 0 %
HCT: 28.5 % — ABNORMAL LOW (ref 39.0–52.0)
Hemoglobin: 9.1 g/dL — ABNORMAL LOW (ref 13.0–17.0)
Immature Granulocytes: 1 %
Lymphocytes Relative: 18 %
Lymphs Abs: 0.9 10*3/uL (ref 0.7–4.0)
MCH: 24.3 pg — ABNORMAL LOW (ref 26.0–34.0)
MCHC: 31.9 g/dL (ref 30.0–36.0)
MCV: 76 fL — ABNORMAL LOW (ref 80.0–100.0)
Monocytes Absolute: 0.2 10*3/uL (ref 0.1–1.0)
Monocytes Relative: 4 %
Neutro Abs: 3.9 10*3/uL (ref 1.7–7.7)
Neutrophils Relative %: 77 %
Platelets: 302 10*3/uL (ref 150–400)
RBC: 3.75 MIL/uL — ABNORMAL LOW (ref 4.22–5.81)
RDW: 17.6 % — ABNORMAL HIGH (ref 11.5–15.5)
WBC: 5.2 10*3/uL (ref 4.0–10.5)
nRBC: 0 % (ref 0.0–0.2)

## 2019-11-04 LAB — IRON AND TIBC
Iron: 33 ug/dL — ABNORMAL LOW (ref 45–182)
Saturation Ratios: 12 % — ABNORMAL LOW (ref 17.9–39.5)
TIBC: 270 ug/dL (ref 250–450)
UIBC: 237 ug/dL

## 2019-11-04 LAB — COMPREHENSIVE METABOLIC PANEL
ALT: 13 U/L (ref 0–44)
AST: 14 U/L — ABNORMAL LOW (ref 15–41)
Albumin: 3.2 g/dL — ABNORMAL LOW (ref 3.5–5.0)
Alkaline Phosphatase: 42 U/L (ref 38–126)
Anion gap: 7 (ref 5–15)
BUN: 13 mg/dL (ref 8–23)
CO2: 24 mmol/L (ref 22–32)
Calcium: 8.3 mg/dL — ABNORMAL LOW (ref 8.9–10.3)
Chloride: 106 mmol/L (ref 98–111)
Creatinine, Ser: 0.61 mg/dL (ref 0.61–1.24)
GFR calc Af Amer: >60 mL/min (ref >60)
GFR calc non Af Amer: >60 mL/min (ref >60)
Glucose, Bld: 97 mg/dL (ref 70–99)
Potassium: 3.6 mmol/L (ref 3.5–5.1)
Sodium: 137 mmol/L (ref 135–145)
Total Bilirubin: 0.7 mg/dL (ref 0.3–1.2)
Total Protein: 6.7 g/dL (ref 6.5–8.1)

## 2019-11-04 LAB — FOLATE: Folate: 5.2 ng/mL — ABNORMAL LOW (ref >5.9)

## 2019-11-04 LAB — VITAMIN B12: Vitamin B-12: 128 pg/mL — ABNORMAL LOW (ref 180–914)

## 2019-11-04 LAB — FERRITIN: Ferritin: 173 ng/mL (ref 24–336)

## 2019-11-04 MED ORDER — SODIUM CHLORIDE 0.9 % IV SOLN
Freq: Once | INTRAVENOUS | Status: AC
  Filled 2019-11-04: qty 250

## 2019-11-04 MED ORDER — SODIUM CHLORIDE 0.9 % IV SOLN
20.0000 mg | Freq: Once | INTRAVENOUS | Status: AC
  Administered 2019-11-04: 20 mg via INTRAVENOUS
  Filled 2019-11-04: qty 20

## 2019-11-04 MED ORDER — FAMOTIDINE 20 MG PO TABS: 20.0000 mg | ORAL_TABLET | Freq: Two times a day (BID) | ORAL | 1 refills | Status: DC

## 2019-11-04 MED ORDER — HEPARIN SOD (PORK) LOCK FLUSH 100 UNIT/ML IV SOLN
500.0000 [IU] | Freq: Once | INTRAVENOUS | Status: AC
  Administered 2019-11-04: 500 [IU] via INTRAVENOUS
  Filled 2019-11-04: qty 5

## 2019-11-04 MED ORDER — SODIUM CHLORIDE 0.9% FLUSH
10.0000 mL | INTRAVENOUS | Status: DC | PRN
  Administered 2019-11-04: 10 mL via INTRAVENOUS
  Filled 2019-11-04: qty 10

## 2019-11-04 MED ORDER — DIPHENHYDRAMINE HCL 50 MG/ML IJ SOLN
50.0000 mg | Freq: Once | INTRAMUSCULAR | Status: AC
  Administered 2019-11-04: 50 mg via INTRAVENOUS
  Filled 2019-11-04: qty 1

## 2019-11-04 MED ORDER — PACLITAXEL PROTEIN-BOUND CHEMO INJECTION 100 MG
100.0000 mg/m2 | Freq: Once | INTRAVENOUS | Status: AC
  Administered 2019-11-04: 200 mg via INTRAVENOUS
  Filled 2019-11-04: qty 40

## 2019-11-04 MED ORDER — PALONOSETRON HCL INJECTION 0.25 MG/5ML
0.2500 mg | Freq: Once | INTRAVENOUS | Status: AC
  Administered 2019-11-04: 0.25 mg via INTRAVENOUS
  Filled 2019-11-04: qty 5

## 2019-11-04 MED ORDER — SODIUM CHLORIDE 0.9 % IV SOLN
214.6000 mg | Freq: Once | INTRAVENOUS | Status: AC
  Administered 2019-11-04: 210 mg via INTRAVENOUS
  Filled 2019-11-04: qty 21

## 2019-11-04 MED ORDER — FAMOTIDINE IN NACL 20-0.9 MG/50ML-% IV SOLN
20.0000 mg | Freq: Once | INTRAVENOUS | Status: AC
  Administered 2019-11-04: 20 mg via INTRAVENOUS
  Filled 2019-11-04: qty 50

## 2019-11-04 MED ORDER — HEPARIN SOD (PORK) LOCK FLUSH 100 UNIT/ML IV SOLN
INTRAVENOUS | Status: AC
  Filled 2019-11-04: qty 5

## 2019-11-04 MED ORDER — FOLIC ACID 1 MG PO TABS: 1.0000 mg | ORAL_TABLET | Freq: Every day | ORAL | 3 refills | Status: DC

## 2019-11-04 NOTE — Addendum Note (Signed)
Addended by: Randa Evens C on: 11/04/2019 02:57 PM   Modules accepted: Orders

## 2019-11-04 NOTE — Progress Notes (Signed)
Hematology/Oncology Consult note [facility]  Telephone:([phone] [phone]  Patient Care Team: Center, [facility] as PCP - General Telford Nab, [place] as Oncology Nurse Navigator   Name of the patient: Mitchell Frye  [id number]  02-23-1950   Date of visit: 11/04/19  Diagnosis- stage IIIBadenocarcinoma of the right lung cT2 cN3 cM0  Chief complaint/ Reason for visit-on treatment assessment prior to cycle 2 of weekly carbotaxol chemotherapy  Heme/Onc history: Patient is a 69 year old male with a past medical history significant for hypertension and hyperlipidemia who presented to the ER with symptoms of worsening cough and shortness of breath. He underwent CT angios chest which did not show any PE. Soft tissue attenuation measuring 2.9 x 4 cm centered in the left hilum resulting in abrupt angulation and narrowing of the pulmonary arteries and the central left lower lobe airways. Additional ipsilateral hilar and subcarinal adenopathy. No contralateral adenopathy. Consolidative masslike opacity 3.6 x 3.1 cm in size and contiguous with more central perihilar soft tissue attenuation. Overall findings concerning for primary bronchogenic carcinoma. Patient referred for further management.Patient was discharged on oral Augmentin  Patient lives with his sister who is his main caregiver. He does not drive but is independent of his ADLs. Reports ongoing fatigue and occasional retrosternal chest pain. He has been evaluated by GI in the past for reflux as well. Appetieis fair and weight is stable.Denies any significant shortness of breath at this time.Patient is an ex-smoker and smoked for about 20 years but quit smoking back in 1994  PET CT scan showed hypermetabolic mass in the left lower lobe with mediastinal adenopathy and right paratracheal adenopathy. No evidence of distant metastatic disease. Pathology was consistent with  non-small cell lung cancer favor adenocarcinoma. Cells were positive for TTF-1 and negative for p40.  Plan is for concurrent chemoradiation with weekly carbotaxol followed by maintenance durvalumab  Interval history-reports having itching especially at night after he started chemotherapy.  Denies any significant skin rash.  Reports having heartburn over the last 2 to 3 days ECOG PS- 1 Pain scale- 0 Opioid associated constipation- no  Review of systems- Review of Systems  Constitutional: Negative for chills, fever, malaise/fatigue and weight loss.  HENT: Negative for congestion, ear discharge and nosebleeds.   Eyes: Negative for blurred vision.  Respiratory: Negative for cough, hemoptysis, sputum production, shortness of breath and wheezing.   Cardiovascular: Negative for chest pain, palpitations, orthopnea and claudication.  Gastrointestinal: Negative for abdominal pain, blood in stool, constipation, diarrhea, heartburn, melena, nausea and vomiting.  Genitourinary: Negative for dysuria, flank pain, frequency, hematuria and urgency.  Musculoskeletal: Negative for back pain, joint pain and myalgias.  Skin: Positive for itching. Negative for rash.  Neurological: Negative for dizziness, tingling, focal weakness, seizures, weakness and headaches.  Endo/Heme/Allergies: Does not bruise/bleed easily.  Psychiatric/Behavioral: Negative for depression and suicidal ideas. The patient does not have insomnia.       Allergies  Allergen Reactions  . Taxol [Paclitaxel] Other (See Comments)    Chest pain, hip pain, coughing , wheezing     Past Medical History:  Diagnosis Date  . Dyspnea   . Hyperlipidemia   . Hypertension      Past Surgical History:  Procedure Laterality Date  . PORTA CATH INSERTION N/A 10/22/2019   Procedure: PORTA CATH INSERTION;  Surgeon: Katha Cabal, MD;  Location: [facility];  Service: Cardiovascular;  Laterality: N/A;  . VIDEO BRONCHOSCOPY WITH  ENDOBRONCHIAL ULTRASOUND N/A 09/25/2019  Procedure: VIDEO BRONCHOSCOPY WITH ENDOBRONCHIAL ULTRASOUND;  Surgeon: Tyler Pita, MD;  Location: [facility];  Service: Pulmonary;  Laterality: N/A;    Social History   Socioeconomic History  . Marital status: Single    Spouse name: Not on file  . Number of children: Not on file  . Years of education: Not on file  . Highest education level: Not on file  Occupational History  . Not on file  Tobacco Use  . Smoking status: Former Smoker    Packs/day: 1.00    Years: 20.00    Pack years: 20.00    Types: Cigarettes    Quit date: 1994    Years since quitting: 27.7  . Smokeless tobacco: Never Used  Vaping Use  . Vaping Use: Never used  Substance and Sexual Activity  . Alcohol use: Yes    Comment: occ  . Drug use: Never  . Sexual activity: Not on file  Other Topics Concern  . Not on file  Social History Narrative  . Not on file   Social Determinants of Health   Financial Resource Strain:   . Difficulty of Paying Living Expenses: Not on file  Food Insecurity:   . Worried About Charity fundraiser in the Last Year: Not on file  . Ran Out of Food in the Last Year: Not on file  Transportation Needs:   . Lack of Transportation (Medical): Not on file  . Lack of Transportation (Non-Medical): Not on file  Physical Activity:   . Days of Exercise per Week: Not on file  . Minutes of Exercise per Session: Not on file  Stress:   . Feeling of Stress : Not on file  Social Connections:   . Frequency of Communication with Friends and Family: Not on file  . Frequency of Social Gatherings with Friends and Family: Not on file  . Attends Religious Services: Not on file  . Active Member of Clubs or Organizations: Not on file  . Attends Archivist Meetings: Not on file  . Marital Status: Not on file  Intimate Partner Violence:   . Fear of Current or Ex-Partner: Not on file  . Emotionally Abused: Not on file  . Physically Abused: Not  on file  . Sexually Abused: Not on file    Family History  Problem Relation Age of Onset  . Cancer Brother      Current Outpatient Medications:  .  acetaminophen (TYLENOL) 500 MG tablet, Take 1,000 mg by mouth every 6 (six) hours as needed for mild pain, moderate pain or headache., Disp: , Rfl:  .  albuterol (VENTOLIN HFA) 108 (90 Base) MCG/ACT inhaler, Inhale 2 puffs into the lungs every 4 (four) hours as needed for wheezing or shortness of breath. , Disp: , Rfl:  .  ANORO ELLIPTA 62.5-25 MCG/INH AEPB, Inhale 1 puff into the lungs daily. , Disp: , Rfl:  .  benzonatate (TESSALON PERLES) 100 MG capsule, Take 1 capsule (100 mg total) by mouth every 6 (six) hours as needed for cough., Disp: 90 capsule, Rfl: 0 .  dexamethasone (DECADRON) 4 MG tablet, Take 2 tablets (8 mg total) by mouth daily. Start the day after chemotherapy for 2 days., Disp: 30 tablet, Rfl: 1 .  gabapentin (NEURONTIN) 100 MG capsule, TAKE 1 CAPSULE(100 MG) BY MOUTH THREE TIMES DAILY, Disp: 90 capsule, Rfl: 0 .  lidocaine-prilocaine (EMLA) cream, Apply to affected area once, Disp: 30 g, Rfl: 3 .  LORazepam (ATIVAN) 0.5 MG tablet, Take  1 tablet (0.5 mg total) by mouth every 6 (six) hours as needed (Nausea or vomiting)., Disp: 30 tablet, Rfl: 0 .  ondansetron (ZOFRAN) 8 MG tablet, Take 1 tablet (8 mg total) by mouth 2 (two) times daily as needed for refractory nausea / vomiting. Start on day 3 after chemo., Disp: 30 tablet, Rfl: 1 .  oxyCODONE (OXY IR/ROXICODONE) 5 MG immediate release tablet, Take 1-2 tablets (5-10 mg total) by mouth every 4 (four) hours as needed for severe pain., Disp: 90 tablet, Rfl: 0 .  pantoprazole (PROTONIX) 40 MG tablet, Take 1 tablet (40 mg total) by mouth daily., Disp: 30 tablet, Rfl: 3 .  prochlorperazine (COMPAZINE) 10 MG tablet, TAKE 1 TABLET(10 MG) BY MOUTH EVERY 6 HOURS AS NEEDED FOR NAUSEA OR VOMITING, Disp: 30 tablet, Rfl: 1 .  famotidine (PEPCID) 20 MG tablet, Take 1 tablet (20 mg total) by  mouth 2 (two) times daily., Disp: 60 tablet, Rfl: 1 No current facility-administered medications for this visit.  Facility-Administered Medications Ordered in Other Visits:  .  CARBOplatin (PARAPLATIN) 210 mg in sodium chloride 0.9 % 250 mL chemo infusion, 210 mg, Intravenous, Once, Sindy Guadeloupe, MD .  heparin lock flush 100 unit/mL, 500 Units, Intravenous, Once, Sindy Guadeloupe, MD .  PACLitaxel-protein bound (ABRAXANE) chemo infusion 200 mg, 100 mg/m2 (Treatment Plan Recorded), Intravenous, Once, Sindy Guadeloupe, MD, Last Rate: 80 mL/hr at 11/04/19 1228, 200 mg at 11/04/19 1228 .  sodium chloride flush (NS) 0.9 % injection 10 mL, 10 mL, Intravenous, PRN, Sindy Guadeloupe, MD, 10 mL at 11/04/19 1012  Physical exam:  Vitals:   11/04/19 1035  BP: (!) 147/93  Pulse: 73  Resp: 18  Temp: (!) 96.3 F (35.7 C)  TempSrc: Tympanic  SpO2: 100%  Weight: 185 lb 14.4 oz (84.3 kg)   Physical Exam Constitutional:      General: He is not in acute distress. Cardiovascular:     Rate and Rhythm: Normal rate and regular rhythm.     Heart sounds: Normal heart sounds.  Pulmonary:     Effort: Pulmonary effort is normal.     Breath sounds: Normal breath sounds.  Abdominal:     General: Bowel sounds are normal.     Palpations: Abdomen is soft.  Skin:    General: Skin is warm and dry.  Neurological:     Mental Status: He is alert and oriented to person, place, and time.      CMP Latest Ref Rng & Units 11/04/2019  Glucose 70 - 99 mg/dL 97  BUN 8 - 23 mg/dL 13  Creatinine 0.61 - 1.24 mg/dL 0.61  Sodium 135 - 145 mmol/L 137  Potassium 3.5 - 5.1 mmol/L 3.6  Chloride 98 - 111 mmol/L 106  CO2 22 - 32 mmol/L 24  Calcium 8.9 - 10.3 mg/dL 8.3(L)  Total Protein 6.5 - 8.1 g/dL 6.7  Total Bilirubin 0.3 - 1.2 mg/dL 0.7  Alkaline Phos 38 - 126 U/L 42  AST 15 - 41 U/L 14(L)  ALT 0 - 44 U/L 13   CBC Latest Ref Rng & Units 11/04/2019  WBC 4.0 - 10.5 K/uL 5.2  Hemoglobin 13.0 - 17.0 g/dL 9.1(L)    Hematocrit 39 - 52 % 28.5(L)  Platelets 150 - 400 K/uL 302    No images are attached to the encounter.  CT Head W Wo Contrast  Result Date: 10/10/2019 CLINICAL DATA:  69 year old male with recently diagnosed non-small cell lung cancer. Headaches x1 month. Staging.  EXAM: CT HEAD WITHOUT AND WITH CONTRAST TECHNIQUE: Contiguous axial images were obtained from the base of the skull through the vertex without and with intravenous contrast CONTRAST:  25mL OMNIPAQUE IOHEXOL 300 MG/ML  SOLN COMPARISON:  PET-CT 09/16/2019 FINDINGS: Brain: No intracranial mass effect or midline shift. No ventriculomegaly. No acute intracranial hemorrhage identified. No cortically based acute infarct identified. Patchy and confluent bilateral periventricular and other cerebral white matter hypodensity. Some involvement of the deep white matter capsules. No cortical encephalomalacia identified. No abnormal enhancement identified. Vascular: The major intracranial vascular structures are enhancing as expected. Skull: Negative. Sinuses/Orbits: Visualized paranasal sinuses and mastoids are clear. Other: Visualized orbits and scalp soft tissues are within normal limits. IMPRESSION: 1. No metastatic disease or acute intracranial abnormality identified. 2. 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 Electronically Signed   By: Genevie Ann M.D.   On: 10/10/2019 16:18   PERIPHERAL VASCULAR CATHETERIZATION  Result Date: 10/22/2019 See op note    Assessment and plan- Patient is a 69 y.o. male with stage IIIBadenocarcinoma of the left lung cT2 cN3 cM0.   He is here for on treatment assessment prior to cycle 2 of weekly carbotaxol chemotherapy  Counts okay to proceed with cycle 2 of weekly carbotaxol chemotherapy today.  He will directly proceed for cycle 3 next week and I will see him back in 2 weeks for cycle 4  Microcytic anemia:B12 levels are pending.  Folate level is low and I  will send him a prescription for folic acid 1 mg daily.  Iron studies are suggestive of anemia of chronic disease.  He does not require IV iron at this time.  Itching: I have asked the patient to use Benadryl 25 mg at night as needed.  Indigestion: We will send a prescription for Pepcid   Visit Diagnosis 1. Encounter for antineoplastic chemotherapy   2. 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)   3. Heartburn      Dr. Randa Evens, MD, MPH [facility] [phone] 11/04/2019 12:53 PM

## 2019-11-05 ENCOUNTER — Ambulatory Visit
Admission: RE | Admit: 2019-11-05 | Discharge: 2019-11-05 | Disposition: A | Discharge status: Home / Self Care | Payer: Medicaid Other | Source: Ambulatory Visit | Attending: Radiation Oncology | Admitting: Radiation Oncology

## 2019-11-05 DIAGNOSIS — Z510 Encounter for antineoplastic radiation therapy: Secondary | ICD-10-CM | POA: Diagnosis not present

## 2019-11-06 ENCOUNTER — Ambulatory Visit
Admission: RE | Admit: 2019-11-06 | Discharge: 2019-11-06 | Disposition: A | Discharge status: Home / Self Care | Payer: Medicaid Other | Source: Ambulatory Visit | Attending: Radiation Oncology | Admitting: Radiation Oncology

## 2019-11-06 DIAGNOSIS — Z510 Encounter for antineoplastic radiation therapy: Secondary | ICD-10-CM | POA: Diagnosis not present

## 2019-11-07 ENCOUNTER — Ambulatory Visit
Admission: RE | Admit: 2019-11-07 | Discharge: 2019-11-07 | Disposition: A | Discharge status: Home / Self Care | Payer: Medicaid Other | Source: Ambulatory Visit | Attending: Radiation Oncology | Admitting: Radiation Oncology

## 2019-11-07 DIAGNOSIS — Z510 Encounter for antineoplastic radiation therapy: Secondary | ICD-10-CM | POA: Diagnosis not present

## 2019-11-08 ENCOUNTER — Ambulatory Visit
Admission: RE | Admit: 2019-11-08 | Discharge: 2019-11-08 | Disposition: A | Discharge status: Home / Self Care | Payer: Medicaid Other | Source: Ambulatory Visit | Attending: Radiation Oncology | Admitting: Radiation Oncology

## 2019-11-08 DIAGNOSIS — Z510 Encounter for antineoplastic radiation therapy: Secondary | ICD-10-CM | POA: Diagnosis not present

## 2019-11-11 ENCOUNTER — Encounter: Payer: Self-pay | Admitting: *Deleted

## 2019-11-11 ENCOUNTER — Other Ambulatory Visit: Payer: Self-pay | Admitting: Oncology

## 2019-11-11 ENCOUNTER — Telehealth: Payer: Self-pay | Admitting: *Deleted

## 2019-11-11 ENCOUNTER — Inpatient Hospital Stay: Payer: Medicaid Other

## 2019-11-11 ENCOUNTER — Other Ambulatory Visit: Payer: Self-pay

## 2019-11-11 ENCOUNTER — Ambulatory Visit
Admission: RE | Admit: 2019-11-11 | Discharge: 2019-11-11 | Disposition: A | Discharge status: Home / Self Care | Payer: Medicaid Other | Source: Ambulatory Visit | Attending: Radiation Oncology | Admitting: Radiation Oncology

## 2019-11-11 VITALS — BP 123/78 | HR 77 | Temp 97.7°F | Resp 20 | Wt 185.0 lb

## 2019-11-11 DIAGNOSIS — Z510 Encounter for antineoplastic radiation therapy: Secondary | ICD-10-CM | POA: Diagnosis not present

## 2019-11-11 DIAGNOSIS — Z5111 Encounter for antineoplastic chemotherapy: Secondary | ICD-10-CM | POA: Diagnosis not present

## 2019-11-11 DIAGNOSIS — E538 Deficiency of other specified B group vitamins: Secondary | ICD-10-CM

## 2019-11-11 DIAGNOSIS — C3432 Malignant neoplasm of lower lobe, left bronchus or lung: Secondary | ICD-10-CM

## 2019-11-11 LAB — CBC WITH DIFFERENTIAL/PLATELET
Abs Immature Granulocytes: 0.11 10*3/uL — ABNORMAL HIGH (ref 0.00–0.07)
Basophils Absolute: 0 10*3/uL (ref 0.0–0.1)
Basophils Relative: 1 %
Eosinophils Absolute: 0 10*3/uL (ref 0.0–0.5)
Eosinophils Relative: 1 %
HCT: 27.5 % — ABNORMAL LOW (ref 39.0–52.0)
Hemoglobin: 8.8 g/dL — ABNORMAL LOW (ref 13.0–17.0)
Immature Granulocytes: 5 %
Lymphocytes Relative: 28 %
Lymphs Abs: 0.6 10*3/uL — ABNORMAL LOW (ref 0.7–4.0)
MCH: 24.8 pg — ABNORMAL LOW (ref 26.0–34.0)
MCHC: 32 g/dL (ref 30.0–36.0)
MCV: 77.5 fL — ABNORMAL LOW (ref 80.0–100.0)
Monocytes Absolute: 0.2 10*3/uL (ref 0.1–1.0)
Monocytes Relative: 8 %
Neutro Abs: 1.2 10*3/uL — ABNORMAL LOW (ref 1.7–7.7)
Neutrophils Relative %: 57 %
Platelets: 282 10*3/uL (ref 150–400)
RBC: 3.55 MIL/uL — ABNORMAL LOW (ref 4.22–5.81)
RDW: 19.7 % — ABNORMAL HIGH (ref 11.5–15.5)
Smear Review: NORMAL
WBC: 2 10*3/uL — ABNORMAL LOW (ref 4.0–10.5)
nRBC: 2 % — ABNORMAL HIGH (ref 0.0–0.2)

## 2019-11-11 LAB — COMPREHENSIVE METABOLIC PANEL
ALT: 12 U/L (ref 0–44)
AST: 15 U/L (ref 15–41)
Albumin: 3.4 g/dL — ABNORMAL LOW (ref 3.5–5.0)
Alkaline Phosphatase: 47 U/L (ref 38–126)
Anion gap: 6 (ref 5–15)
BUN: 16 mg/dL (ref 8–23)
CO2: 23 mmol/L (ref 22–32)
Calcium: 8.3 mg/dL — ABNORMAL LOW (ref 8.9–10.3)
Chloride: 107 mmol/L (ref 98–111)
Creatinine, Ser: 0.88 mg/dL (ref 0.61–1.24)
GFR calc Af Amer: >60 mL/min (ref >60)
GFR calc non Af Amer: >60 mL/min (ref >60)
Glucose, Bld: 132 mg/dL — ABNORMAL HIGH (ref 70–99)
Potassium: 3.7 mmol/L (ref 3.5–5.1)
Sodium: 136 mmol/L (ref 135–145)
Total Bilirubin: 0.7 mg/dL (ref 0.3–1.2)
Total Protein: 6.4 g/dL — ABNORMAL LOW (ref 6.5–8.1)

## 2019-11-11 MED ORDER — DIPHENHYDRAMINE HCL 50 MG/ML IJ SOLN
50.0000 mg | Freq: Once | INTRAMUSCULAR | Status: AC
  Administered 2019-11-11: 50 mg via INTRAVENOUS
  Filled 2019-11-11: qty 1

## 2019-11-11 MED ORDER — SODIUM CHLORIDE 0.9 % IV SOLN
Freq: Once | INTRAVENOUS | Status: AC
  Filled 2019-11-11: qty 250

## 2019-11-11 MED ORDER — HEPARIN SOD (PORK) LOCK FLUSH 100 UNIT/ML IV SOLN
500.0000 [IU] | Freq: Once | INTRAVENOUS | Status: DC
  Filled 2019-11-11: qty 5

## 2019-11-11 MED ORDER — SODIUM CHLORIDE 0.9% FLUSH
10.0000 mL | Freq: Once | INTRAVENOUS | Status: AC
  Administered 2019-11-11: 10 mL via INTRAVENOUS
  Filled 2019-11-11: qty 10

## 2019-11-11 MED ORDER — SODIUM CHLORIDE 0.9 % IV SOLN
20.0000 mg | Freq: Once | INTRAVENOUS | Status: AC
  Administered 2019-11-11: 20 mg via INTRAVENOUS
  Filled 2019-11-11: qty 20

## 2019-11-11 MED ORDER — SODIUM CHLORIDE 0.9% FLUSH
10.0000 mL | INTRAVENOUS | Status: DC | PRN
  Filled 2019-11-11: qty 10

## 2019-11-11 MED ORDER — SODIUM CHLORIDE 0.9 % IV SOLN
214.6000 mg | Freq: Once | INTRAVENOUS | Status: AC
  Administered 2019-11-11: 210 mg via INTRAVENOUS
  Filled 2019-11-11: qty 21

## 2019-11-11 MED ORDER — HEPARIN SOD (PORK) LOCK FLUSH 100 UNIT/ML IV SOLN
INTRAVENOUS | Status: AC
  Filled 2019-11-11: qty 5

## 2019-11-11 MED ORDER — CYANOCOBALAMIN 1000 MCG/ML IJ SOLN: 1000.0000 ug | INTRAMUSCULAR | Status: DC

## 2019-11-11 MED ORDER — PACLITAXEL PROTEIN-BOUND CHEMO INJECTION 100 MG
80.0000 mg/m2 | Freq: Once | INTRAVENOUS | Status: AC
  Administered 2019-11-11: 175 mg via INTRAVENOUS
  Filled 2019-11-11: qty 35

## 2019-11-11 MED ORDER — PALONOSETRON HCL INJECTION 0.25 MG/5ML
0.2500 mg | Freq: Once | INTRAVENOUS | Status: AC
  Administered 2019-11-11: 0.25 mg via INTRAVENOUS
  Filled 2019-11-11: qty 5

## 2019-11-11 MED ORDER — HEPARIN SOD (PORK) LOCK FLUSH 100 UNIT/ML IV SOLN
500.0000 [IU] | Freq: Once | INTRAVENOUS | Status: AC | PRN
  Administered 2019-11-11: 500 [IU]
  Filled 2019-11-11: qty 5

## 2019-11-11 MED ORDER — FAMOTIDINE IN NACL 20-0.9 MG/50ML-% IV SOLN
20.0000 mg | Freq: Once | INTRAVENOUS | Status: AC
  Administered 2019-11-11: 20 mg via INTRAVENOUS
  Filled 2019-11-11: qty 50

## 2019-11-11 NOTE — Progress Notes (Signed)
Labs reviewed with MD, ok for tx today.  Abraxane dose reduced

## 2019-11-11 NOTE — Progress Notes (Signed)
  Oncology Nurse Navigator Documentation  Navigator Location: [facility]-Med Onc (11/11/19 1100)   )Navigator Encounter Type: Lobby (11/11/19 1100)                     Patient Visit Type: MedOnc (11/11/19 1100) Treatment Phase: Treatment (11/11/19 1100) Barriers/Navigation Needs: No Barriers At This Time;No Needs;No Questions (11/11/19 1100)   Interventions: None Required (11/11/19 1100)         met with pt's sister in the lobby while pt was receiving radiation treatment. All questions answered during visit. No needs or barriers at this time. Instructed pt's sister to call if has any further questions or needs in the future. Pt's sister verbalized understanding.             Time Spent with Patient: 15 (11/11/19 1100)

## 2019-11-11 NOTE — Telephone Encounter (Signed)
Called pt's sister about his zrxio injections for tues and wed. The injection is for to keep wbc up so pt will not get infection and to keep his counts up good enough to get his treatment on time. He will need to go to the lab to get the injection. They are made for after his radiation appt.

## 2019-11-12 ENCOUNTER — Other Ambulatory Visit: Payer: Self-pay

## 2019-11-12 ENCOUNTER — Inpatient Hospital Stay: Payer: Medicaid Other

## 2019-11-12 ENCOUNTER — Ambulatory Visit
Admission: RE | Admit: 2019-11-12 | Discharge: 2019-11-12 | Disposition: A | Discharge status: Home / Self Care | Payer: Medicaid Other | Source: Ambulatory Visit | Attending: Radiation Oncology | Admitting: Radiation Oncology

## 2019-11-12 DIAGNOSIS — E538 Deficiency of other specified B group vitamins: Secondary | ICD-10-CM

## 2019-11-12 DIAGNOSIS — Z510 Encounter for antineoplastic radiation therapy: Secondary | ICD-10-CM | POA: Diagnosis not present

## 2019-11-12 DIAGNOSIS — Z5111 Encounter for antineoplastic chemotherapy: Secondary | ICD-10-CM | POA: Diagnosis not present

## 2019-11-12 MED ORDER — FILGRASTIM-SNDZ 480 MCG/0.8ML IJ SOSY
480.0000 ug | PREFILLED_SYRINGE | Freq: Every day | INTRAMUSCULAR | Status: AC
  Administered 2019-11-12: 480 ug via SUBCUTANEOUS
  Filled 2019-11-12 (×2): qty 0.8

## 2019-11-13 ENCOUNTER — Inpatient Hospital Stay: Payer: Medicaid Other

## 2019-11-13 ENCOUNTER — Other Ambulatory Visit: Payer: Self-pay

## 2019-11-13 ENCOUNTER — Ambulatory Visit
Admission: RE | Admit: 2019-11-13 | Discharge: 2019-11-13 | Disposition: A | Discharge status: Home / Self Care | Payer: Medicaid Other | Source: Ambulatory Visit | Attending: Radiation Oncology | Admitting: Radiation Oncology

## 2019-11-13 DIAGNOSIS — Z5111 Encounter for antineoplastic chemotherapy: Secondary | ICD-10-CM | POA: Diagnosis not present

## 2019-11-13 DIAGNOSIS — Z510 Encounter for antineoplastic radiation therapy: Secondary | ICD-10-CM | POA: Diagnosis not present

## 2019-11-13 DIAGNOSIS — E538 Deficiency of other specified B group vitamins: Secondary | ICD-10-CM

## 2019-11-13 MED ORDER — FILGRASTIM-SNDZ 480 MCG/0.8ML IJ SOSY
480.0000 ug | PREFILLED_SYRINGE | Freq: Every day | INTRAMUSCULAR | Status: DC
  Administered 2019-11-13: 480 ug via SUBCUTANEOUS

## 2019-11-14 ENCOUNTER — Ambulatory Visit
Admission: RE | Admit: 2019-11-14 | Discharge: 2019-11-14 | Disposition: A | Discharge status: Home / Self Care | Payer: Medicaid Other | Source: Ambulatory Visit | Attending: Radiation Oncology | Admitting: Radiation Oncology

## 2019-11-14 DIAGNOSIS — Z510 Encounter for antineoplastic radiation therapy: Secondary | ICD-10-CM | POA: Diagnosis not present

## 2019-11-15 ENCOUNTER — Ambulatory Visit
Admission: RE | Admit: 2019-11-15 | Discharge: 2019-11-15 | Disposition: A | Discharge status: Home / Self Care | Payer: Medicaid Other | Source: Ambulatory Visit | Attending: Radiation Oncology | Admitting: Radiation Oncology

## 2019-11-15 DIAGNOSIS — Z510 Encounter for antineoplastic radiation therapy: Secondary | ICD-10-CM | POA: Insufficient documentation

## 2019-11-15 DIAGNOSIS — C3432 Malignant neoplasm of lower lobe, left bronchus or lung: Secondary | ICD-10-CM | POA: Insufficient documentation

## 2019-11-18 ENCOUNTER — Ambulatory Visit
Admission: RE | Admit: 2019-11-18 | Discharge: 2019-11-18 | Disposition: A | Discharge status: Home / Self Care | Payer: Medicaid Other | Source: Ambulatory Visit | Attending: Radiation Oncology | Admitting: Radiation Oncology

## 2019-11-18 ENCOUNTER — Inpatient Hospital Stay: Payer: Medicaid Other | Attending: Oncology

## 2019-11-18 ENCOUNTER — Inpatient Hospital Stay (HOSPITAL_BASED_OUTPATIENT_CLINIC_OR_DEPARTMENT_OTHER): Payer: Medicaid Other | Admitting: Oncology

## 2019-11-18 ENCOUNTER — Inpatient Hospital Stay: Payer: Medicaid Other

## 2019-11-18 ENCOUNTER — Inpatient Hospital Stay (HOSPITAL_BASED_OUTPATIENT_CLINIC_OR_DEPARTMENT_OTHER): Payer: Medicaid Other | Admitting: Hospice and Palliative Medicine

## 2019-11-18 ENCOUNTER — Other Ambulatory Visit: Payer: Self-pay

## 2019-11-18 ENCOUNTER — Encounter: Payer: Self-pay | Admitting: Oncology

## 2019-11-18 VITALS — BP 124/75 | HR 79 | Temp 98.0°F | Resp 18 | Wt 189.1 lb

## 2019-11-18 DIAGNOSIS — E876 Hypokalemia: Secondary | ICD-10-CM | POA: Insufficient documentation

## 2019-11-18 DIAGNOSIS — Z515 Encounter for palliative care: Secondary | ICD-10-CM | POA: Diagnosis not present

## 2019-11-18 DIAGNOSIS — C3432 Malignant neoplasm of lower lobe, left bronchus or lung: Secondary | ICD-10-CM

## 2019-11-18 DIAGNOSIS — K219 Gastro-esophageal reflux disease without esophagitis: Secondary | ICD-10-CM | POA: Diagnosis not present

## 2019-11-18 DIAGNOSIS — R5383 Other fatigue: Secondary | ICD-10-CM | POA: Insufficient documentation

## 2019-11-18 DIAGNOSIS — Z79899 Other long term (current) drug therapy: Secondary | ICD-10-CM | POA: Insufficient documentation

## 2019-11-18 DIAGNOSIS — Y842 Radiological procedure and radiotherapy as the cause of abnormal reaction of the patient, or of later complication, without mention of misadventure at the time of the procedure: Secondary | ICD-10-CM | POA: Diagnosis not present

## 2019-11-18 DIAGNOSIS — R0602 Shortness of breath: Secondary | ICD-10-CM | POA: Insufficient documentation

## 2019-11-18 DIAGNOSIS — E538 Deficiency of other specified B group vitamins: Secondary | ICD-10-CM | POA: Diagnosis not present

## 2019-11-18 DIAGNOSIS — D649 Anemia, unspecified: Secondary | ICD-10-CM | POA: Insufficient documentation

## 2019-11-18 DIAGNOSIS — Z5112 Encounter for antineoplastic immunotherapy: Secondary | ICD-10-CM | POA: Diagnosis present

## 2019-11-18 DIAGNOSIS — Z87891 Personal history of nicotine dependence: Secondary | ICD-10-CM | POA: Insufficient documentation

## 2019-11-18 DIAGNOSIS — D509 Iron deficiency anemia, unspecified: Secondary | ICD-10-CM

## 2019-11-18 DIAGNOSIS — K208 Other esophagitis without bleeding: Secondary | ICD-10-CM | POA: Insufficient documentation

## 2019-11-18 DIAGNOSIS — I10 Essential (primary) hypertension: Secondary | ICD-10-CM | POA: Insufficient documentation

## 2019-11-18 DIAGNOSIS — D6481 Anemia due to antineoplastic chemotherapy: Secondary | ICD-10-CM | POA: Diagnosis not present

## 2019-11-18 DIAGNOSIS — Z5111 Encounter for antineoplastic chemotherapy: Secondary | ICD-10-CM | POA: Diagnosis not present

## 2019-11-18 DIAGNOSIS — Z95828 Presence of other vascular implants and grafts: Secondary | ICD-10-CM

## 2019-11-18 DIAGNOSIS — E785 Hyperlipidemia, unspecified: Secondary | ICD-10-CM | POA: Insufficient documentation

## 2019-11-18 DIAGNOSIS — Z510 Encounter for antineoplastic radiation therapy: Secondary | ICD-10-CM | POA: Diagnosis not present

## 2019-11-18 LAB — COMPREHENSIVE METABOLIC PANEL
ALT: 11 U/L (ref 0–44)
AST: 15 U/L (ref 15–41)
Albumin: 3.3 g/dL — ABNORMAL LOW (ref 3.5–5.0)
Alkaline Phosphatase: 53 U/L (ref 38–126)
Anion gap: 7 (ref 5–15)
BUN: 15 mg/dL (ref 8–23)
CO2: 24 mmol/L (ref 22–32)
Calcium: 8.5 mg/dL — ABNORMAL LOW (ref 8.9–10.3)
Chloride: 109 mmol/L (ref 98–111)
Creatinine, Ser: 0.74 mg/dL (ref 0.61–1.24)
GFR calc Af Amer: >60 mL/min (ref >60)
GFR calc non Af Amer: >60 mL/min (ref >60)
Glucose, Bld: 110 mg/dL — ABNORMAL HIGH (ref 70–99)
Potassium: 3.8 mmol/L (ref 3.5–5.1)
Sodium: 140 mmol/L (ref 135–145)
Total Bilirubin: 0.8 mg/dL (ref 0.3–1.2)
Total Protein: 6.7 g/dL (ref 6.5–8.1)

## 2019-11-18 LAB — CBC WITH DIFFERENTIAL/PLATELET
Abs Immature Granulocytes: 0.54 10*3/uL — ABNORMAL HIGH (ref 0.00–0.07)
Basophils Absolute: 0 10*3/uL (ref 0.0–0.1)
Basophils Relative: 1 %
Eosinophils Absolute: 0 10*3/uL (ref 0.0–0.5)
Eosinophils Relative: 0 %
HCT: 27.9 % — ABNORMAL LOW (ref 39.0–52.0)
Hemoglobin: 9 g/dL — ABNORMAL LOW (ref 13.0–17.0)
Immature Granulocytes: 9 %
Lymphocytes Relative: 12 %
Lymphs Abs: 0.7 10*3/uL (ref 0.7–4.0)
MCH: 25.1 pg — ABNORMAL LOW (ref 26.0–34.0)
MCHC: 32.3 g/dL (ref 30.0–36.0)
MCV: 77.9 fL — ABNORMAL LOW (ref 80.0–100.0)
Monocytes Absolute: 1 10*3/uL (ref 0.1–1.0)
Monocytes Relative: 16 %
Neutro Abs: 3.7 10*3/uL (ref 1.7–7.7)
Neutrophils Relative %: 62 %
Platelets: 233 10*3/uL (ref 150–400)
RBC: 3.58 MIL/uL — ABNORMAL LOW (ref 4.22–5.81)
RDW: 21.8 % — ABNORMAL HIGH (ref 11.5–15.5)
WBC: 6 10*3/uL (ref 4.0–10.5)
nRBC: 0.7 % — ABNORMAL HIGH (ref 0.0–0.2)

## 2019-11-18 MED ORDER — HEPARIN SOD (PORK) LOCK FLUSH 100 UNIT/ML IV SOLN
INTRAVENOUS | Status: AC
  Filled 2019-11-18: qty 5

## 2019-11-18 MED ORDER — HEPARIN SOD (PORK) LOCK FLUSH 100 UNIT/ML IV SOLN
500.0000 [IU] | Freq: Once | INTRAVENOUS | Status: AC
  Administered 2019-11-18: 500 [IU] via INTRAVENOUS
  Filled 2019-11-18: qty 5

## 2019-11-18 MED ORDER — PANTOPRAZOLE SODIUM 40 MG PO TBEC
40.0000 mg | DELAYED_RELEASE_TABLET | Freq: Every day | ORAL | 3 refills | Status: DC
Start: 2019-11-18 — End: 2020-07-03

## 2019-11-18 MED ORDER — SODIUM CHLORIDE 0.9 % IV SOLN
20.0000 mg | Freq: Once | INTRAVENOUS | Status: AC
  Administered 2019-11-18: 20 mg via INTRAVENOUS
  Filled 2019-11-18: qty 20

## 2019-11-18 MED ORDER — PALONOSETRON HCL INJECTION 0.25 MG/5ML
0.2500 mg | Freq: Once | INTRAVENOUS | Status: AC
  Administered 2019-11-18: 0.25 mg via INTRAVENOUS
  Filled 2019-11-18: qty 5

## 2019-11-18 MED ORDER — SODIUM CHLORIDE 0.9 % IV SOLN
214.6000 mg | Freq: Once | INTRAVENOUS | Status: AC
  Administered 2019-11-18: 210 mg via INTRAVENOUS
  Filled 2019-11-18: qty 21

## 2019-11-18 MED ORDER — TRAZODONE HCL 50 MG PO TABS: 50.0000 mg | ORAL_TABLET | Freq: Every day | ORAL | 1 refills | Status: DC

## 2019-11-18 MED ORDER — SODIUM CHLORIDE 0.9 % IV SOLN
Freq: Once | INTRAVENOUS | Status: AC
  Filled 2019-11-18: qty 250

## 2019-11-18 MED ORDER — SODIUM CHLORIDE 0.9% FLUSH
10.0000 mL | Freq: Once | INTRAVENOUS | Status: AC
  Administered 2019-11-18: 10 mL via INTRAVENOUS
  Filled 2019-11-18: qty 10

## 2019-11-18 MED ORDER — DIPHENHYDRAMINE HCL 50 MG/ML IJ SOLN
50.0000 mg | Freq: Once | INTRAMUSCULAR | Status: AC
  Administered 2019-11-18: 50 mg via INTRAVENOUS
  Filled 2019-11-18: qty 1

## 2019-11-18 MED ORDER — CYANOCOBALAMIN 1000 MCG/ML IJ SOLN
1000.0000 ug | INTRAMUSCULAR | Status: DC
  Administered 2019-11-18: 1000 ug via INTRAMUSCULAR
  Filled 2019-11-18: qty 1

## 2019-11-18 MED ORDER — FAMOTIDINE IN NACL 20-0.9 MG/50ML-% IV SOLN
20.0000 mg | Freq: Once | INTRAVENOUS | Status: AC
  Administered 2019-11-18: 20 mg via INTRAVENOUS
  Filled 2019-11-18: qty 50

## 2019-11-18 MED ORDER — PACLITAXEL PROTEIN-BOUND CHEMO INJECTION 100 MG
80.0000 mg/m2 | Freq: Once | INTRAVENOUS | Status: AC
  Administered 2019-11-18: 175 mg via INTRAVENOUS
  Filled 2019-11-18: qty 35

## 2019-11-18 NOTE — Progress Notes (Signed)
[facility]  Telephone:([phone] [phone]   Name: Mitchell Frye Date: 11/18/2019 MRN: [id number]  DOB: [birth year]  Patient Care Team: Center, [facility] as PCP - Lillia Mountain, [place], RN as Oncology Nurse Navigator    REASON FOR CONSULTATION: Mitchell Frye is a 69 y.o. 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  PET/CT showed hypermetabolic mass in the left lower lobe with mediastinal and right paratracheal adenopathy.  Pathology was consistent with adenocarcinoma.  Patient was started on concurrent chemoradiation with plan for maintenance immunotherapy.  He was referred to palliative care to help address goals and manage ongoing symptoms.  SOCIAL HISTORY:     reports that he quit smoking about 27 years ago. His smoking use included cigarettes. He has a 20.00 pack-year smoking history. He has never used smokeless tobacco. He reports current alcohol use. He reports that he does not use drugs.  Patient lives at home with his sister who is his primary caregiver.  He does not drive.  ADVANCE DIRECTIVES:  Does not have  CODE STATUS:   PAST MEDICAL HISTORY: Past Medical History:  Diagnosis Date  . Dyspnea   . Hyperlipidemia   . Hypertension     PAST SURGICAL HISTORY:  Past Surgical History:  Procedure Laterality Date  . PORTA CATH INSERTION N/A 10/22/2019   Procedure: PORTA CATH INSERTION;  Surgeon: Katha Cabal, MD;  Location: [facility];  Service: Cardiovascular;  Laterality: N/A;  . VIDEO BRONCHOSCOPY WITH ENDOBRONCHIAL ULTRASOUND N/A 09/25/2019   Procedure: VIDEO BRONCHOSCOPY WITH ENDOBRONCHIAL ULTRASOUND;  Surgeon: Tyler Pita, MD;  Location: [facility] ORS;  Service: Pulmonary;  Laterality: N/A;    HEMATOLOGY/ONCOLOGY HISTORY:  Oncology History  Malignant neoplasm of  lower lobe of left lung ([place])  10/04/2019 Initial Diagnosis   Malignant neoplasm of lower lobe of left lung ([place])   10/04/2019 Cancer Staging   Staging form: Lung, AJCC 8th Edition - Clinical stage from 10/04/2019: Stage IIIB (cT2b, cN3, cM0) - Signed by Sindy Guadeloupe, MD on 10/04/2019   10/28/2019 -  Chemotherapy   The patient had dexamethasone (DECADRON) 4 MG tablet, 8 mg, Oral, Daily, 1 of 1 cycle, Start date: 10/04/2019, End date: -- palonosetron (ALOXI) injection 0.25 mg, 0.25 mg, Intravenous,  Once, 4 of 4 cycles Administration: 0.25 mg (10/28/2019), 0.25 mg (11/04/2019), 0.25 mg (11/11/2019) PACLitaxel-protein bound (ABRAXANE) chemo infusion 200 mg, 100 mg/m2 = 200 mg (100 % of original dose 100 mg/m2), Intravenous,  Once, 4 of 4 cycles Dose modification: 100 mg/m2 (original dose 100 mg/m2, Cycle 2), 80 mg/m2 (original dose 100 mg/m2, Cycle 3, Reason: Other (see comments), Comment: neutropenia), 100 mg/m2 (original dose 100 mg/m2, Cycle 1) Administration: 200 mg (11/04/2019), 175 mg (11/11/2019), 200 mg (10/29/2019) CARBOplatin (PARAPLATIN) 210 mg in sodium chloride 0.9 % 250 mL chemo infusion, 210 mg (100 % of original dose 214.6 mg), Intravenous,  Once, 4 of 4 cycles Dose modification:   (original dose 214.6 mg, Cycle 1) Administration: 210 mg (10/28/2019), 210 mg (11/04/2019), 210 mg (11/11/2019) PACLitaxel (TAXOL) 90 mg in sodium chloride 0.9 % 250 mL chemo infusion (</= 80mg /m2), 45 mg/m2 = 90 mg, Intravenous,  Once, 1 of 1 cycle Administration: 90 mg (10/28/2019)  for chemotherapy treatment.      ALLERGIES:  is allergic to taxol [paclitaxel].  MEDICATIONS:  Current Outpatient Medications  Medication Sig Dispense Refill  .  acetaminophen (TYLENOL) 500 MG tablet Take 1,000 mg by mouth every 6 (six) hours as needed for mild pain, moderate pain or headache.    . albuterol (VENTOLIN HFA) 108 (90 Base) MCG/ACT inhaler Inhale 2 puffs into the lungs every 4 (four) hours as needed for wheezing or  shortness of breath.     Jearl Klinefelter ELLIPTA 62.5-25 MCG/INH AEPB Inhale 1 puff into the lungs daily.     . benzonatate (TESSALON PERLES) 100 MG capsule Take 1 capsule (100 mg total) by mouth every 6 (six) hours as needed for cough. 90 capsule 0  . dexamethasone (DECADRON) 4 MG tablet Take 2 tablets (8 mg total) by mouth daily. Start the day after chemotherapy for 2 days. 30 tablet 1  . famotidine (PEPCID) 20 MG tablet Take 1 tablet (20 mg total) by mouth 2 (two) times daily. 60 tablet 1  . folic acid (FOLVITE) 1 MG tablet Take 1 tablet (1 mg total) by mouth daily. 30 tablet 3  . gabapentin (NEURONTIN) 100 MG capsule TAKE 1 CAPSULE(100 MG) BY MOUTH THREE TIMES DAILY 90 capsule 0  . lidocaine-prilocaine (EMLA) cream Apply to affected area once 30 g 3  . LORazepam (ATIVAN) 0.5 MG tablet Take 1 tablet (0.5 mg total) by mouth every 6 (six) hours as needed (Nausea or vomiting). 30 tablet 0  . ondansetron (ZOFRAN) 8 MG tablet Take 1 tablet (8 mg total) by mouth 2 (two) times daily as needed for refractory nausea / vomiting. Start on day 3 after chemo. 30 tablet 1  . oxyCODONE (OXY IR/ROXICODONE) 5 MG immediate release tablet Take 1-2 tablets (5-10 mg total) by mouth every 4 (four) hours as needed for severe pain. 90 tablet 0  . pantoprazole (PROTONIX) 40 MG tablet Take 1 tablet (40 mg total) by mouth daily. 30 tablet 3  . prochlorperazine (COMPAZINE) 10 MG tablet TAKE 1 TABLET(10 MG) BY MOUTH EVERY 6 HOURS AS NEEDED FOR NAUSEA OR VOMITING 30 tablet 1  . traZODone (DESYREL) 50 MG tablet Take 1 tablet (50 mg total) by mouth at bedtime. 30 tablet 1   No current facility-administered medications for this visit.   Facility-Administered Medications Ordered in Other Visits  Medication Dose Route Frequency Provider Last Rate Last Admin  . CARBOplatin (PARAPLATIN) 210 mg in sodium chloride 0.9 % 250 mL chemo infusion  210 mg Intravenous Once Sindy Guadeloupe, MD      . cyanocobalamin ((VITAMIN B-12)) injection 1,000  mcg  1,000 mcg Intramuscular Q30 days Sindy Guadeloupe, MD   1,000 mcg at 11/18/19 1037  . filgrastim-sndz (ZARXIO) injection 480 mcg  480 mcg Subcutaneous Daily Sindy Guadeloupe, MD   480 mcg at 11/12/19 1100  . heparin lock flush 100 unit/mL  500 Units Intravenous Once Sindy Guadeloupe, MD      . PACLitaxel-protein bound (ABRAXANE) chemo infusion 175 mg  80 mg/m2 (Treatment Plan Recorded) Intravenous Once Sindy Guadeloupe, MD 70 mL/hr at 11/18/19 1117 175 mg at 11/18/19 1117    VITAL SIGNS: There were no vitals taken for this visit. There were no vitals filed for this visit.  Estimated body mass index is 27.13 kg/m as calculated from the following:   Height as of 10/17/19: 5\' 10"  (1.778 m).   Weight as of an earlier encounter on 11/18/19: 189 lb 1.6 oz (85.8 kg).  LABS: CBC:    Component Value Date/Time   WBC 6.0 11/18/2019 0923   HGB 9.0 (L) 11/18/2019 0923   HCT 27.9 (L)  11/18/2019 0923   PLT 233 11/18/2019 0923   MCV 77.9 (L) 11/18/2019 0923   NEUTROABS 3.7 11/18/2019 0923   LYMPHSABS 0.7 11/18/2019 0923   MONOABS 1.0 11/18/2019 0923   EOSABS 0.0 11/18/2019 0923   BASOSABS 0.0 11/18/2019 0923   Comprehensive Metabolic Panel:    Component Value Date/Time   NA 140 11/18/2019 0923   K 3.8 11/18/2019 0923   CL 109 11/18/2019 0923   CO2 24 11/18/2019 0923   BUN 15 11/18/2019 0923   CREATININE 0.74 11/18/2019 0923   GLUCOSE 110 (H) 11/18/2019 0923   CALCIUM 8.5 (L) 11/18/2019 0923   AST 15 11/18/2019 0923   ALT 11 11/18/2019 0923   ALKPHOS 53 11/18/2019 0923   BILITOT 0.8 11/18/2019 0923   PROT 6.7 11/18/2019 0923   ALBUMIN 3.3 (L) 11/18/2019 0923    RADIOGRAPHIC STUDIES: PERIPHERAL VASCULAR CATHETERIZATION  Result Date: 10/22/2019 See op note   PERFORMANCE STATUS (ECOG) : 1  Review of Systems Unless otherwise noted, a complete review of systems is negative.  Physical Exam General: NAD Pulmonary: Unlabored Extremities: no edema, no joint deformities Skin: no  rashes Neurological: Weakness but otherwise nonfocal  IMPRESSION: Patient seen in infusion area.  Patient feels he is doing about the same.  However, he reports worsening GERD and says that he is been out of his Protonix.  GERD has been keeping him awake at night.  Discussed avoiding oral intake for couple of hours before bedtime.  Will refill Protonix.  Patient was also started on trazodone by Dr. Janese Banks today for sleep.  Patient denies any other significant changes or concerns.  PLAN: -Continue current scope of treatment -Continue oxycodone as needed -Refill Protonix 40 mg daily -ACP and MOST form previously reviewed -Follow-up MyChart visit with me in 1 to 2 months   Patient expressed understanding and was in agreement with this plan. He also understands that He can call the clinic at any time with any questions, concerns, or complaints.     Time Total: 15 minutes  Visit consisted of counseling and education dealing with the complex and emotionally intense issues of symptom management and palliative care in the setting of serious and potentially life-threatening illness.Greater than 50%  of this time was spent counseling and coordinating care related to the above assessment and plan.  Signed by: Altha Harm, PhD, NP-C

## 2019-11-18 NOTE — Progress Notes (Signed)
Hematology/Oncology Consult note [facility]  [phone] [phone]  Patient Care Team: Center, [facility] as PCP - General Telford Nab, [place] as Oncology Nurse Navigator   Name of the patient: Mitchell Frye  [id number]  09-Aug-1950   Date of visit: 11/18/19  Diagnosis- stage IIIBadenocarcinoma of the right lung cT2 cN3 cM0  Chief complaint/ Reason for visit-on treatment assessment prior to cycle 4 of weekly carbotaxol chemotherapy  Heme/Onc history:  Patient is a 69 year old male with a past medical history significant for hypertension and hyperlipidemia who presented to the ER with symptoms of worsening cough and shortness of breath. He underwent CT angios chest which did not show any PE. Soft tissue attenuation measuring 2.9 x 4 cm centered in the left hilum resulting in abrupt angulation and narrowing of the pulmonary arteries and the central left lower lobe airways. Additional ipsilateral hilar and subcarinal adenopathy. No contralateral adenopathy. Consolidative masslike opacity 3.6 x 3.1 cm in size and contiguous with more central perihilar soft tissue attenuation. Overall findings concerning for primary bronchogenic carcinoma. Patient referred for further management.Patient was discharged on oral Augmentin  Patient lives with his sister who is his main caregiver. He does not drive but is independent of his ADLs. Reports ongoing fatigue and occasional retrosternal chest pain. He has been evaluated by GI in the past for reflux as well. Appetieis fair and weight is stable.Denies any significant shortness of breath at this time.Patient is an ex-smoker and smoked for about 20 years but quit smoking back in 1994  PET CT scan showed hypermetabolic mass in the left lower lobe with mediastinal adenopathy and right paratracheal adenopathy. No evidence of distant metastatic disease. Pathology was consistent with  non-small cell lung cancer favor adenocarcinoma. Cells were positive for TTF-1 and negative for p40.  Plan is for concurrent chemoradiation with weekly carbotaxol followed by maintenance durvalumab  Interval history-patient reports having some pain around his port site today.  Reports chronic fatigue.  Denies other complaints at this time  ECOG PS- 1 Pain scale- 4 Opioid associated constipation- no  Review of systems- Review of Systems  Constitutional: Negative for chills, fever, malaise/fatigue and weight loss.  HENT: Negative for congestion, ear discharge and nosebleeds.   Eyes: Negative for blurred vision.  Respiratory: Negative for cough, hemoptysis, sputum production, shortness of breath and wheezing.   Cardiovascular: Negative for chest pain, palpitations, orthopnea and claudication.       Pain around port site  Gastrointestinal: Negative for abdominal pain, blood in stool, constipation, diarrhea, heartburn, melena, nausea and vomiting.  Genitourinary: Negative for dysuria, flank pain, frequency, hematuria and urgency.  Musculoskeletal: Negative for back pain, joint pain and myalgias.  Skin: Negative for rash.  Neurological: Negative for dizziness, tingling, focal weakness, seizures, weakness and headaches.  Endo/Heme/Allergies: Does not bruise/bleed easily.  Psychiatric/Behavioral: Negative for depression and suicidal ideas. The patient does not have insomnia.      Allergies  Allergen Reactions  . Taxol [Paclitaxel] Other (See Comments)    Chest pain, hip pain, coughing , wheezing     Past Medical History:  Diagnosis Date  . Dyspnea   . Hyperlipidemia   . Hypertension      Past Surgical History:  Procedure Laterality Date  . PORTA CATH INSERTION N/A 10/22/2019   Procedure: PORTA CATH INSERTION;  Surgeon: Katha Cabal, MD;  Location: [place] [facility];  Service: Cardiovascular;  Laterality: N/A;  . VIDEO BRONCHOSCOPY WITH ENDOBRONCHIAL ULTRASOUND N/A  09/25/2019   Procedure: VIDEO BRONCHOSCOPY WITH ENDOBRONCHIAL ULTRASOUND;  Surgeon: Tyler Pita, MD;  Location: [facility] OR[facility];  Service: Pulmonary;  Laterality: N/A;    Social History   Socioeconomic History  . Marital status: Single    Spouse name: Not on file  . Number of children: Not on file  . Years of education: Not on file  . Highest education level: Not on file  Occupational History  . Not on file  Tobacco Use  . Smoking status: Former Smoker    Packs/day: 1.00    Years: 20.00    Pack years: 20.00    Types: Cigarettes    Quit date: 1994    Years since quitting: 27.7  . Smokeless tobacco: Never Used  Vaping Use  . Vaping Use: Never used  Substance and Sexual Activity  . Alcohol use: Yes    Comment: occ  . Drug use: Never  . Sexual activity: Not on file  Other Topics Concern  . Not on file  Social History Narrative  . Not on file   Social Determinants of Health   Financial Resource Strain:   . Difficulty of Paying Living Expenses: Not on file  Food Insecurity:   . Worried About Charity fundraiser in the Last Year: Not on file  . Ran Out of Food in the Last Year: Not on file  Transportation Needs:   . Lack of Transportation (Medical): Not on file  . Lack of Transportation (Non-Medical): Not on file  Physical Activity:   . Days of Exercise per Week: Not on file  . Minutes of Exercise per Session: Not on file  Stress:   . Feeling of Stress : Not on file  Social Connections:   . Frequency of Communication with Friends and Family: Not on file  . Frequency of Social Gatherings with Friends and Family: Not on file  . Attends Religious Services: Not on file  . Active Member of Clubs or Organizations: Not on file  . Attends Archivist Meetings: Not on file  . Marital Status: Not on file  Intimate Partner Violence:   . Fear of Current or Ex-Partner: Not on file  . Emotionally Abused: Not on file  . Physically Abused: Not on file  . Sexually Abused:  Not on file    Family History  Problem Relation Age of Onset  . Cancer Brother      Current Outpatient Medications:  .  acetaminophen (TYLENOL) 500 MG tablet, Take 1,000 mg by mouth every 6 (six) hours as needed for mild pain, moderate pain or headache., Disp: , Rfl:  .  albuterol (VENTOLIN HFA) 108 (90 Base) MCG/ACT inhaler, Inhale 2 puffs into the lungs every 4 (four) hours as needed for wheezing or shortness of breath. , Disp: , Rfl:  .  ANORO ELLIPTA 62.5-25 MCG/INH AEPB, Inhale 1 puff into the lungs daily. , Disp: , Rfl:  .  benzonatate (TESSALON PERLES) 100 MG capsule, Take 1 capsule (100 mg total) by mouth every 6 (six) hours as needed for cough., Disp: 90 capsule, Rfl: 0 .  dexamethasone (DECADRON) 4 MG tablet, Take 2 tablets (8 mg total) by mouth daily. Start the day after chemotherapy for 2 days., Disp: 30 tablet, Rfl: 1 .  famotidine (PEPCID) 20 MG tablet, Take 1 tablet (20 mg total) by mouth 2 (two) times daily., Disp: 60 tablet, Rfl: 1 .  folic acid (FOLVITE) 1 MG tablet, Take 1 tablet (1 mg total) by mouth daily.,  Disp: 30 tablet, Rfl: 3 .  gabapentin (NEURONTIN) 100 MG capsule, TAKE 1 CAPSULE(100 MG) BY MOUTH THREE TIMES DAILY, Disp: 90 capsule, Rfl: 0 .  lidocaine-prilocaine (EMLA) cream, Apply to affected area once, Disp: 30 g, Rfl: 3 .  LORazepam (ATIVAN) 0.5 MG tablet, Take 1 tablet (0.5 mg total) by mouth every 6 (six) hours as needed (Nausea or vomiting)., Disp: 30 tablet, Rfl: 0 .  ondansetron (ZOFRAN) 8 MG tablet, Take 1 tablet (8 mg total) by mouth 2 (two) times daily as needed for refractory nausea / vomiting. Start on day 3 after chemo., Disp: 30 tablet, Rfl: 1 .  oxyCODONE (OXY IR/ROXICODONE) 5 MG immediate release tablet, Take 1-2 tablets (5-10 mg total) by mouth every 4 (four) hours as needed for severe pain., Disp: 90 tablet, Rfl: 0 .  prochlorperazine (COMPAZINE) 10 MG tablet, TAKE 1 TABLET(10 MG) BY MOUTH EVERY 6 HOURS AS NEEDED FOR NAUSEA OR VOMITING, Disp: 30  tablet, Rfl: 1 .  pantoprazole (PROTONIX) 40 MG tablet, Take 1 tablet (40 mg total) by mouth daily., Disp: 30 tablet, Rfl: 3 .  traZODone (DESYREL) 50 MG tablet, Take 1 tablet (50 mg total) by mouth at bedtime., Disp: 30 tablet, Rfl: 1 No current facility-administered medications for this visit.  Facility-Administered Medications Ordered in Other Visits:  .  cyanocobalamin ((VITAMIN B-12)) injection 1,000 mcg, 1,000 mcg, Intramuscular, Q30 days, Sindy Guadeloupe, MD, 1,000 mcg at 11/18/19 1037 .  filgrastim-sndz [facility]) injection 480 mcg, 480 mcg, Subcutaneous, Daily, Sindy Guadeloupe, MD, 480 mcg at 11/12/19 1100  Physical exam:  Vitals:   11/18/19 0943  BP: 124/75  Pulse: 79  Resp: 18  Temp: 98 F (36.7 C)  TempSrc: Tympanic  SpO2: 99%  Weight: 189 lb 1.6 oz (85.8 kg)   Physical Exam Constitutional:      General: He is not in acute distress. Cardiovascular:     Rate and Rhythm: Normal rate and regular rhythm.     Heart sounds: Normal heart sounds.     Comments: Port site appears unremarkable.  No evidence of infection or inflammation Pulmonary:     Effort: Pulmonary effort is normal.     Breath sounds: Normal breath sounds.  Abdominal:     General: Bowel sounds are normal.     Palpations: Abdomen is soft.  Musculoskeletal:     Cervical back: Normal range of motion.  Skin:    General: Skin is warm and dry.  Neurological:     Mental Status: He is alert and oriented to person, place, and time.      CMP Latest Ref Rng & Units 11/18/2019  Glucose 70 - 99 mg/dL 110(H)  BUN 8 - 23 mg/dL 15  Creatinine 0.61 - 1.24 mg/dL 0.74  Sodium 135 - 145 mmol/L 140  Potassium 3.5 - 5.1 mmol/L 3.8  Chloride 98 - 111 mmol/L 109  CO2 22 - 32 mmol/L 24  Calcium 8.9 - 10.3 mg/dL 8.5(L)  Total Protein 6.5 - 8.1 g/dL 6.7  Total Bilirubin 0.3 - 1.2 mg/dL 0.8  Alkaline Phos 38 - 126 U/L 53  AST 15 - 41 U/L 15  ALT 0 - 44 U/L 11   CBC Latest Ref Rng & Units 11/18/2019  WBC 4.0 - 10.5 K/uL  6.0  Hemoglobin 13.0 - 17.0 g/dL 9.0(L)  Hematocrit 39 - 52 % 27.9(L)  Platelets 150 - 400 K/uL 233    No images are attached to the encounter.  PERIPHERAL VASCULAR CATHETERIZATION  Result Date:  10/22/2019 See op note    Assessment and plan- Patient is a 69 y.o. 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 He is here for on treatment assessment prior to cycle 4 of weekly carbotaxol chemotherapy  Patient had allergic reaction to Taxol and was switched to Abraxane.  There is a [facility] surgeon of Abraxane in today as a last time he can receive Abraxane.  This is fourth weekly treatment.  He has 3 more weeks of radiation treatment ago.  I will therefore plan to give him carboplatin AUC 5 along with Alimta 500 mg per metered squared in 1 week's time.  That will take him through the next 3 weeks and he will not require any further chemotherapy along with radiation.  I will see him in 1 week's time for [place] Alimta chemotherapy.  Microcytic anemia: Likely secondary to chronic disease.  Iron studies are suggestive of anemia of chronic disease with a normal ferritin of 173.  He did have a low B12 level of 128 and will receive B12 injections along with chemotherapy.  I will refill his prescription for Protonix   Visit Diagnosis 1. Encounter for antineoplastic chemotherapy   2. Malignant neoplasm of lower lobe of left lung ([place])   3. Microcytic anemia      Dr. Randa Evens, MD, MPH [facility] [phone] 11/18/2019 1:13 PM

## 2019-11-18 NOTE — Progress Notes (Signed)
Nutrition Follow-up:  Patient with adenocarcinoma of right lung.  Patient receiving concurrent chemotherapy and radiation therapy.   Met with patient during infusion.  Patient reports indigestion and unable to sleep well at night.  Noted has been out of protonix and MD has refilled prescription today.  Sister is preparing meals for patient.  Patient has been eating foods high in protein.  Says that he has not been drinking oral nutrition supplements.     Medications: reviewed  Labs: reviewed  Anthropometrics:   Weight 189 lb 1.6 oz today increased from 187 lb on 9/13  NUTRITION DIAGNOSIS: Inadequate oral intake improving   INTERVENTION:  Discussed strategies to help with GERD.   Encouraged good sources of protein at every meal.     MONITORING, EVALUATION, GOAL: weight trends, intake   NEXT VISIT: November 8th phone f/u  Jaira Canady B. Zenia Resides, [place], [place] Registered Dietitian [phone] (mobile)

## 2019-11-19 ENCOUNTER — Ambulatory Visit
Admission: RE | Admit: 2019-11-19 | Discharge: 2019-11-19 | Disposition: A | Discharge status: Home / Self Care | Payer: Medicaid Other | Source: Ambulatory Visit | Attending: Radiation Oncology | Admitting: Radiation Oncology

## 2019-11-19 DIAGNOSIS — Z510 Encounter for antineoplastic radiation therapy: Secondary | ICD-10-CM | POA: Diagnosis not present

## 2019-11-20 ENCOUNTER — Ambulatory Visit
Admission: RE | Admit: 2019-11-20 | Discharge: 2019-11-20 | Disposition: A | Discharge status: Home / Self Care | Payer: Medicaid Other | Source: Ambulatory Visit | Attending: Radiation Oncology | Admitting: Radiation Oncology

## 2019-11-20 DIAGNOSIS — Z510 Encounter for antineoplastic radiation therapy: Secondary | ICD-10-CM | POA: Diagnosis not present

## 2019-11-21 ENCOUNTER — Ambulatory Visit
Admission: RE | Admit: 2019-11-21 | Discharge: 2019-11-21 | Disposition: A | Discharge status: Home / Self Care | Payer: Medicaid Other | Source: Ambulatory Visit | Attending: Radiation Oncology | Admitting: Radiation Oncology

## 2019-11-21 DIAGNOSIS — Z510 Encounter for antineoplastic radiation therapy: Secondary | ICD-10-CM | POA: Diagnosis not present

## 2019-11-22 ENCOUNTER — Ambulatory Visit
Admission: RE | Admit: 2019-11-22 | Discharge: 2019-11-22 | Disposition: A | Discharge status: Home / Self Care | Payer: Medicaid Other | Source: Ambulatory Visit | Attending: Radiation Oncology | Admitting: Radiation Oncology

## 2019-11-22 ENCOUNTER — Other Ambulatory Visit: Payer: Self-pay | Admitting: *Deleted

## 2019-11-22 DIAGNOSIS — Z510 Encounter for antineoplastic radiation therapy: Secondary | ICD-10-CM | POA: Diagnosis not present

## 2019-11-22 MED ORDER — SUCRALFATE 1 G PO TABS: 1.0000 g | ORAL_TABLET | Freq: Three times a day (TID) | ORAL | 1 refills | Status: DC

## 2019-11-25 ENCOUNTER — Inpatient Hospital Stay: Payer: Medicaid Other

## 2019-11-25 ENCOUNTER — Other Ambulatory Visit: Payer: Self-pay | Admitting: *Deleted

## 2019-11-25 ENCOUNTER — Telehealth: Payer: Self-pay | Admitting: *Deleted

## 2019-11-25 ENCOUNTER — Other Ambulatory Visit: Payer: Self-pay

## 2019-11-25 ENCOUNTER — Ambulatory Visit
Admission: RE | Admit: 2019-11-25 | Discharge: 2019-11-25 | Disposition: A | Discharge status: Home / Self Care | Payer: Medicaid Other | Source: Ambulatory Visit | Attending: Radiation Oncology | Admitting: Radiation Oncology

## 2019-11-25 ENCOUNTER — Inpatient Hospital Stay (HOSPITAL_BASED_OUTPATIENT_CLINIC_OR_DEPARTMENT_OTHER): Payer: Medicaid Other | Admitting: Oncology

## 2019-11-25 VITALS — BP 135/75 | HR 74 | Temp 98.0°F | Resp 16 | Ht 70.0 in | Wt 191.7 lb

## 2019-11-25 DIAGNOSIS — E538 Deficiency of other specified B group vitamins: Secondary | ICD-10-CM

## 2019-11-25 DIAGNOSIS — C3432 Malignant neoplasm of lower lobe, left bronchus or lung: Secondary | ICD-10-CM

## 2019-11-25 DIAGNOSIS — Z510 Encounter for antineoplastic radiation therapy: Secondary | ICD-10-CM | POA: Diagnosis not present

## 2019-11-25 DIAGNOSIS — Z5111 Encounter for antineoplastic chemotherapy: Secondary | ICD-10-CM

## 2019-11-25 DIAGNOSIS — D509 Iron deficiency anemia, unspecified: Secondary | ICD-10-CM | POA: Diagnosis not present

## 2019-11-25 DIAGNOSIS — D6481 Anemia due to antineoplastic chemotherapy: Secondary | ICD-10-CM

## 2019-11-25 DIAGNOSIS — T451X5A Adverse effect of antineoplastic and immunosuppressive drugs, initial encounter: Secondary | ICD-10-CM

## 2019-11-25 DIAGNOSIS — Z5112 Encounter for antineoplastic immunotherapy: Secondary | ICD-10-CM | POA: Diagnosis not present

## 2019-11-25 LAB — CBC WITH DIFFERENTIAL/PLATELET
Abs Immature Granulocytes: 0.08 10*3/uL — ABNORMAL HIGH (ref 0.00–0.07)
Basophils Absolute: 0 10*3/uL (ref 0.0–0.1)
Basophils Relative: 0 %
Eosinophils Absolute: 0 10*3/uL (ref 0.0–0.5)
Eosinophils Relative: 0 %
HCT: 26.6 % — ABNORMAL LOW (ref 39.0–52.0)
Hemoglobin: 8.5 g/dL — ABNORMAL LOW (ref 13.0–17.0)
Immature Granulocytes: 2 %
Lymphocytes Relative: 9 %
Lymphs Abs: 0.5 10*3/uL — ABNORMAL LOW (ref 0.7–4.0)
MCH: 25 pg — ABNORMAL LOW (ref 26.0–34.0)
MCHC: 32 g/dL (ref 30.0–36.0)
MCV: 78.2 fL — ABNORMAL LOW (ref 80.0–100.0)
Monocytes Absolute: 0.3 10*3/uL (ref 0.1–1.0)
Monocytes Relative: 7 %
Neutro Abs: 4.3 10*3/uL (ref 1.7–7.7)
Neutrophils Relative %: 82 %
Platelets: 239 10*3/uL (ref 150–400)
RBC: 3.4 MIL/uL — ABNORMAL LOW (ref 4.22–5.81)
RDW: 23.3 % — ABNORMAL HIGH (ref 11.5–15.5)
WBC: 5.2 10*3/uL (ref 4.0–10.5)
nRBC: 0.4 % — ABNORMAL HIGH (ref 0.0–0.2)

## 2019-11-25 LAB — COMPREHENSIVE METABOLIC PANEL
ALT: 11 U/L (ref 0–44)
AST: 15 U/L (ref 15–41)
Albumin: 3.6 g/dL (ref 3.5–5.0)
Alkaline Phosphatase: 48 U/L (ref 38–126)
Anion gap: 6 (ref 5–15)
BUN: 18 mg/dL (ref 8–23)
CO2: 24 mmol/L (ref 22–32)
Calcium: 8.6 mg/dL — ABNORMAL LOW (ref 8.9–10.3)
Chloride: 110 mmol/L (ref 98–111)
Creatinine, Ser: 0.77 mg/dL (ref 0.61–1.24)
GFR, Estimated: >60 mL/min (ref >60)
Glucose, Bld: 108 mg/dL — ABNORMAL HIGH (ref 70–99)
Potassium: 3.6 mmol/L (ref 3.5–5.1)
Sodium: 140 mmol/L (ref 135–145)
Total Bilirubin: 0.7 mg/dL (ref 0.3–1.2)
Total Protein: 6.7 g/dL (ref 6.5–8.1)

## 2019-11-25 MED ORDER — FAMOTIDINE IN NACL 20-0.9 MG/50ML-% IV SOLN
20.0000 mg | Freq: Once | INTRAVENOUS | Status: AC
  Administered 2019-11-25: 20 mg via INTRAVENOUS
  Filled 2019-11-25: qty 50

## 2019-11-25 MED ORDER — CYANOCOBALAMIN 1000 MCG/ML IJ SOLN
1000.0000 ug | INTRAMUSCULAR | Status: DC
  Administered 2019-11-25: 1000 ug via INTRAMUSCULAR
  Filled 2019-11-25: qty 1

## 2019-11-25 MED ORDER — BENZONATATE 100 MG PO CAPS: 100.0000 mg | ORAL_CAPSULE | Freq: Four times a day (QID) | ORAL | 0 refills | Status: DC | PRN

## 2019-11-25 MED ORDER — SODIUM CHLORIDE 0.9 % IV SOLN
150.0000 mg | Freq: Once | INTRAVENOUS | Status: AC
  Administered 2019-11-25: 150 mg via INTRAVENOUS
  Filled 2019-11-25: qty 150

## 2019-11-25 MED ORDER — SODIUM CHLORIDE 0.9 % IV SOLN
Freq: Once | INTRAVENOUS | Status: AC
  Filled 2019-11-25: qty 250

## 2019-11-25 MED ORDER — HEPARIN SOD (PORK) LOCK FLUSH 100 UNIT/ML IV SOLN
500.0000 [IU] | Freq: Once | INTRAVENOUS | Status: AC
  Administered 2019-11-25: 500 [IU] via INTRAVENOUS
  Filled 2019-11-25: qty 5

## 2019-11-25 MED ORDER — SODIUM CHLORIDE 0.9% FLUSH
10.0000 mL | INTRAVENOUS | Status: DC | PRN
  Administered 2019-11-25: 10 mL via INTRAVENOUS
  Filled 2019-11-25: qty 10

## 2019-11-25 MED ORDER — SODIUM CHLORIDE 0.9 % IV SOLN
500.0000 mg/m2 | Freq: Once | INTRAVENOUS | Status: AC
  Administered 2019-11-25: 1000 mg via INTRAVENOUS
  Filled 2019-11-25: qty 40

## 2019-11-25 MED ORDER — PALONOSETRON HCL INJECTION 0.25 MG/5ML
0.2500 mg | Freq: Once | INTRAVENOUS | Status: AC
  Administered 2019-11-25: 0.25 mg via INTRAVENOUS
  Filled 2019-11-25: qty 5

## 2019-11-25 MED ORDER — SODIUM CHLORIDE 0.9 % IV SOLN
548.0000 mg | Freq: Once | INTRAVENOUS | Status: AC
  Administered 2019-11-25: 550 mg via INTRAVENOUS
  Filled 2019-11-25: qty 55

## 2019-11-25 MED ORDER — SODIUM CHLORIDE 0.9 % IV SOLN
10.0000 mg | Freq: Once | INTRAVENOUS | Status: AC
  Administered 2019-11-25: 10 mg via INTRAVENOUS
  Filled 2019-11-25: qty 10

## 2019-11-25 MED ORDER — FAMOTIDINE IN NACL 20-0.9 MG/50ML-% IV SOLN: 20.0000 mg | Freq: Once | INTRAVENOUS | Status: DC

## 2019-11-25 MED ORDER — HEPARIN SOD (PORK) LOCK FLUSH 100 UNIT/ML IV SOLN
INTRAVENOUS | Status: AC
  Filled 2019-11-25: qty 5

## 2019-11-25 MED ORDER — MORPHINE SULFATE (PF) 2 MG/ML IV SOLN
4.0000 mg | Freq: Once | INTRAVENOUS | Status: AC
  Administered 2019-11-25: 4 mg via INTRAVENOUS
  Filled 2019-11-25: qty 2

## 2019-11-25 MED ORDER — OXYCODONE HCL 5 MG PO TABS: 5.0000 mg | ORAL_TABLET | ORAL | 0 refills | Status: DC | PRN

## 2019-11-25 NOTE — Telephone Encounter (Signed)
Called the sister and told her that pt can have trazodone 2 tablets at night to see if that will help him with sleep. She will make sure he gets 2 tablets at night

## 2019-11-25 NOTE — Progress Notes (Signed)
Pt was given a carafate rx today due to esophagus burning. He feels like his pain pill is not working. He takes 1 pill in am and 1 pill in pm. I told him to take 2 tablets and every 4-6 hours and see if that helps at ll. He does not sleep good. Told him to take lorazepam at night

## 2019-11-25 NOTE — Progress Notes (Signed)
Per MD, Dr. Janese Banks, order: patient should receive Vitamin B-12 injection 1000 mcg IM once today; order is present in supportive therapy plan for every 4 weeks and per MD order, it is okay to release order early and administer today, see MAR.

## 2019-11-26 ENCOUNTER — Ambulatory Visit
Admission: RE | Admit: 2019-11-26 | Discharge: 2019-11-26 | Disposition: A | Discharge status: Home / Self Care | Payer: Medicaid Other | Source: Ambulatory Visit | Attending: Radiation Oncology | Admitting: Radiation Oncology

## 2019-11-26 DIAGNOSIS — Z510 Encounter for antineoplastic radiation therapy: Secondary | ICD-10-CM | POA: Diagnosis not present

## 2019-11-27 ENCOUNTER — Ambulatory Visit
Admission: RE | Admit: 2019-11-27 | Discharge: 2019-11-27 | Disposition: A | Discharge status: Home / Self Care | Payer: Medicaid Other | Source: Ambulatory Visit | Attending: Radiation Oncology | Admitting: Radiation Oncology

## 2019-11-27 DIAGNOSIS — Z510 Encounter for antineoplastic radiation therapy: Secondary | ICD-10-CM | POA: Diagnosis not present

## 2019-11-27 NOTE — Progress Notes (Signed)
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 He underwent CT angios chest which did not show any PE. Soft tissue attenuation measuring 2.9 x 4 cm centered in the left hilum resulting in abrupt angulation and narrowing of the pulmonary arteries and the central left lower lobe airways. Additional ipsilateral hilar and subcarinal adenopathy. No contralateral adenopathy. Consolidative masslike opacity 3.6 x 3.1 cm in size and contiguous with more central perihilar soft tissue attenuation. Overall findings concerning for primary bronchogenic carcinoma. Patient referred for further management.Patient was discharged on oral Augmentin  Patient lives with his sister who is his main caregiver. He does not drive but is independent of his ADLs. Reports ongoing fatigue and occasional retrosternal chest pain. He has been evaluated by GI in the past for reflux as well. Appetieis fair and weight is stable.Denies any significant shortness of breath at this time.Patient is an ex-smoker and smoked for about 20 years but quit smoking back in 1994  PET CT scan showed hypermetabolic mass in the left lower lobe with mediastinal adenopathy and right paratracheal adenopathy. No evidence of distant metastatic disease. Pathology was consistent with  non-small cell lung cancer favor adenocarcinoma. Cells were positive for TTF-1 and negative for p40.  Plan is for concurrent chemoradiation with weekly carbotaxol followed by maintenance durvalumab   Interval history-reports having Significant heartburn and was prescribed Carafate by radiation oncology which she has not started taking yet.  Denies any significant nausea vomiting  ECOG PS- 1 Pain scale- 0   Review of systems- Review of Systems  Constitutional: Positive for malaise/fatigue. Negative for chills, fever and weight loss.  HENT: Negative for congestion, ear discharge and nosebleeds.   Eyes: Negative for blurred vision.  Respiratory: Negative for cough, hemoptysis, sputum production, shortness of breath and wheezing.   Cardiovascular: Negative for chest pain, palpitations, orthopnea and claudication.  Gastrointestinal: Positive for heartburn. Negative for abdominal pain, blood in stool, constipation, diarrhea, melena, nausea and vomiting.  Genitourinary: Negative for dysuria, flank pain, frequency, hematuria and urgency.  Musculoskeletal: Negative for back pain, joint pain and myalgias.  Skin: Negative for rash.  Neurological: Negative for dizziness, tingling, focal weakness, seizures, weakness and headaches.  Endo/Heme/Allergies: Does not bruise/bleed easily.  Psychiatric/Behavioral: Negative for depression and suicidal ideas. The patient does not have insomnia.       Allergies  Allergen Reactions  . Taxol [Paclitaxel] Other (See Comments)    Chest pain, hip pain, coughing , wheezing     Past Medical History:  Diagnosis Date  . Dyspnea   . Hyperlipidemia   . Hypertension      Past Surgical History:  Procedure Laterality Date  . PORTA CATH INSERTION N/A 10/22/2019   Procedure: PORTA CATH INSERTION;  Surgeon: Katha Cabal, MD;  Location: [facility];  Service: Cardiovascular;  Laterality: N/A;  . VIDEO BRONCHOSCOPY WITH ENDOBRONCHIAL ULTRASOUND  N/A 09/25/2019   Procedure: VIDEO  BRONCHOSCOPY WITH ENDOBRONCHIAL ULTRASOUND;  Surgeon: Tyler Pita, MD;  Location: [facility];  Service: Pulmonary;  Laterality: N/A;    Social History   Socioeconomic History  . Marital status: Single    Spouse name: Not on file  . Number of children: Not on file  . Years of education: Not on file  . Highest education level: Not on file  Occupational History  . Not on file  Tobacco Use  . Smoking status: Former Smoker    Packs/day: 1.00    Years: 20.00    Pack years: 20.00    Types: Cigarettes    Quit date: 1994    Years since quitting: 27.8  . Smokeless tobacco: Never Used  Vaping Use  . Vaping Use: Never used  Substance and Sexual Activity  . Alcohol use: Yes    Comment: occ  . Drug use: Never  . Sexual activity: Not on file  Other Topics Concern  . Not on file  Social History Narrative  . Not on file   Social Determinants of Health   Financial Resource Strain:   . Difficulty of Paying Living Expenses: Not on file  Food Insecurity:   . Worried About Charity fundraiser in the Last Year: Not on file  . Ran Out of Food in the Last Year: Not on file  Transportation Needs:   . Lack of Transportation (Medical): Not on file  . Lack of Transportation (Non-Medical): Not on file  Physical Activity:   . Days of Exercise per Week: Not on file  . Minutes of Exercise per Session: Not on file  Stress:   . Feeling of Stress : Not on file  Social Connections:   . Frequency of Communication with Friends and Family: Not on file  . Frequency of Social Gatherings with Friends and Family: Not on file  . Attends Religious Services: Not on file  . Active Member of Clubs or Organizations: Not on file  . Attends Archivist Meetings: Not on file  . Marital Status: Not on file  Intimate Partner Violence:   . Fear of Current or Ex-Partner: Not on file  . Emotionally Abused: Not on file  . Physically Abused: Not on file  . Sexually  Abused: Not on file    Family History  Problem Relation Age of Onset  . Cancer Brother      Current Outpatient Medications:  .  acetaminophen (TYLENOL) 500 MG tablet, Take 1,000 mg by mouth every 6 (six) hours as needed for mild pain, moderate pain or headache., Disp: , Rfl:  .  albuterol (VENTOLIN HFA) 108 (90 Base) MCG/ACT inhaler, Inhale 2 puffs into the lungs every 4 (four) hours as needed for wheezing or shortness of breath. , Disp: , Rfl:  .  ANORO ELLIPTA 62.5-25 MCG/INH AEPB, Inhale 1 puff into the lungs daily. , Disp: , Rfl:  .  famotidine (PEPCID) 20 MG tablet, Take 1 tablet (20 mg total) by mouth 2 (two) times daily., Disp: 60 tablet, Rfl: 1 .  folic acid (FOLVITE) 1 MG tablet, Take 1 tablet (1 mg total) by mouth daily., Disp: 30 tablet, Rfl: 3 .  gabapentin (NEURONTIN) 100 MG capsule, TAKE 1 CAPSULE(100 MG) BY MOUTH THREE TIMES DAILY, Disp: 90 capsule, Rfl: 0 .  pantoprazole (PROTONIX) 40 MG tablet, Take 1 tablet (40 mg total) by mouth daily., Disp: 30 tablet, Rfl: 3 .  sucralfate (CARAFATE) 1 g tablet, Take 1 tablet (1 g total) by mouth 3 (  three) times daily. Dissolve in 3-4 tbsp warm water, swish and swallow., Disp: 90 tablet, Rfl: 1 .  traZODone (DESYREL) 50 MG tablet, Take 1 tablet (50 mg total) by mouth at bedtime., Disp: 30 tablet, Rfl: 1 .  benzonatate (TESSALON PERLES) 100 MG capsule, Take 1 capsule (100 mg total) by mouth every 6 (six) hours as needed for cough., Disp: 90 capsule, Rfl: 0 .  oxyCODONE (OXY IR/ROXICODONE) 5 MG immediate release tablet, Take 1-2 tablets (5-10 mg total) by mouth every 4 (four) hours as needed for severe pain., Disp: 90 tablet, Rfl: 0 No current facility-administered medications for this visit.  Facility-Administered Medications Ordered in Other Visits:  .  filgrastim-sndz (ZARXIO) injection 480 mcg, 480 mcg, Subcutaneous, Daily, Sindy Guadeloupe, MD, 480 mcg at 11/12/19 1100  Physical exam:  Vitals:   11/25/19 1045  BP: 135/75  Pulse: 74    Resp: 16  Temp: 98 F (36.7 C)  TempSrc: Oral  Weight: 191 lb 11.2 oz (87 kg)  Height: 5\' 10"  (1.778 m)   Physical Exam Constitutional:      General: He is not in acute distress. Cardiovascular:     Rate and Rhythm: Normal rate and regular rhythm.     Heart sounds: Normal heart sounds.  Pulmonary:     Effort: Pulmonary effort is normal.     Breath sounds: Normal breath sounds.  Abdominal:     General: Bowel sounds are normal.     Palpations: Abdomen is soft.  Skin:    General: Skin is warm and dry.  Neurological:     Mental Status: He is alert and oriented to person, place, and time.      CMP Latest Ref Rng & Units 11/25/2019  Glucose 70 - 99 mg/dL 108(H)  BUN 8 - 23 mg/dL 18  Creatinine 0.61 - 1.24 mg/dL 0.77  Sodium 135 - 145 mmol/L 140  Potassium 3.5 - 5.1 mmol/L 3.6  Chloride 98 - 111 mmol/L 110  CO2 22 - 32 mmol/L 24  Calcium 8.9 - 10.3 mg/dL 8.6(L)  Total Protein 6.5 - 8.1 g/dL 6.7  Total Bilirubin 0.3 - 1.2 mg/dL 0.7  Alkaline Phos 38 - 126 U/L 48  AST 15 - 41 U/L 15  ALT 0 - 44 U/L 11   CBC Latest Ref Rng & Units 11/25/2019  WBC 4.0 - 10.5 K/uL 5.2  Hemoglobin 13.0 - 17.0 g/dL 8.5(L)  Hematocrit 39 - 52 % 26.6(L)  Platelets 150 - 400 K/uL 239   Assessment and plan- Patient is a 69 y.o. 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 He is here for on treatment assessment prior to cycle 1 of carbo Alimta chemotherapy  Patient has received 4 weekly cycles of carbotaxol/ abraxane so far.  Patient had allergic reaction to Taxol and was switched to Abraxane and he received 2 cycles of TAC.  However due to nationwide Abraxane shortage we are not able to continue Abraxane at this time.  Given that he has adenocarcinoma we are proceeding with [place] Alimta today.  This will take him through the next 3 weeks and he will not receive any further chemotherapy.  I will plan to get repeat CT chest abdomen and pelvis with contrast after concurrent  radiation and chemo.  If he has stable disease/partial response we will switch him to maintenance immunotherapy  Microcytic anemia: Likely secondary to chemotherapy hemoglobin down to 8.5.  Iron studies indicative of anemia of chronic disease.  B12 levels are  low at 128.  He received B12 with last treatment and will get another B12 shot today.  It is possible that his hemoglobin may drop down to less than 7 requiring blood transfusion.  I will therefore see him back in 10 days for possible blood transfusion.  He is also on folic acid supplements   Visit Diagnosis 1. Malignant neoplasm of lower lobe of left lung ([place])   2. Microcytic anemia   3. Antineoplastic chemotherapy induced anemia   4. B12 deficiency   5. Encounter for antineoplastic chemotherapy      Dr. Randa Evens, MD, MPH [facility] [phone] 11/27/2019 11:54 AM

## 2019-11-28 ENCOUNTER — Ambulatory Visit: Payer: Medicaid Other

## 2019-11-29 ENCOUNTER — Ambulatory Visit: Payer: Medicaid Other

## 2019-12-02 ENCOUNTER — Ambulatory Visit
Admission: RE | Admit: 2019-12-02 | Discharge: 2019-12-02 | Disposition: A | Discharge status: Home / Self Care | Payer: Medicaid Other | Source: Ambulatory Visit | Attending: Radiation Oncology | Admitting: Radiation Oncology

## 2019-12-02 ENCOUNTER — Inpatient Hospital Stay (HOSPITAL_BASED_OUTPATIENT_CLINIC_OR_DEPARTMENT_OTHER): Payer: Medicaid Other | Admitting: Nurse Practitioner

## 2019-12-02 ENCOUNTER — Other Ambulatory Visit: Payer: Self-pay

## 2019-12-02 ENCOUNTER — Other Ambulatory Visit: Payer: Self-pay | Admitting: *Deleted

## 2019-12-02 ENCOUNTER — Inpatient Hospital Stay: Payer: Medicaid Other

## 2019-12-02 VITALS — BP 131/80 | HR 78 | Temp 98.7°F | Resp 18 | Wt 185.0 lb

## 2019-12-02 DIAGNOSIS — K208 Other esophagitis without bleeding: Secondary | ICD-10-CM

## 2019-12-02 DIAGNOSIS — T66XXXA Radiation sickness, unspecified, initial encounter: Secondary | ICD-10-CM

## 2019-12-02 DIAGNOSIS — Z5112 Encounter for antineoplastic immunotherapy: Secondary | ICD-10-CM | POA: Diagnosis not present

## 2019-12-02 DIAGNOSIS — Z510 Encounter for antineoplastic radiation therapy: Secondary | ICD-10-CM | POA: Diagnosis not present

## 2019-12-02 MED ORDER — OXYCODONE HCL 5 MG PO TABS
10.0000 mg | ORAL_TABLET | ORAL | 0 refills | Status: DC | PRN
Start: 2019-12-02 — End: 2020-11-05

## 2019-12-02 NOTE — Progress Notes (Signed)
Symptom Management [place]  [phone] [phone]  Patient Care Team: Center, [facility] as PCP - General Telford Nab, RN as Oncology Nurse Navigator   Name of the patient: Mitchell Frye  [id number]  Nov 05, 1950   Date of visit: 12/02/19  Diagnosis-stage IIIb adenocarcinoma of the lung  Chief complaint/ Reason for visit-mouth pain and painful swallowing  Heme/Onc history:  Oncology History  Malignant neoplasm of lower lobe of left lung ([place])  10/04/2019 Initial Diagnosis   Malignant neoplasm of lower lobe of left lung ([place])   10/04/2019 Cancer Staging   Staging form: Lung, AJCC 8th Edition - Clinical stage from 10/04/2019: Stage IIIB (cT2b, cN3, cM0) - Signed by Sindy Guadeloupe, MD on 10/04/2019   10/28/2019 - 11/18/2019 Chemotherapy   The patient had dexamethasone (DECADRON) 4 MG tablet, 8 mg, Oral, Daily, 1 of 1 cycle, Start date: 10/04/2019, End date: -- palonosetron (ALOXI) injection 0.25 mg, 0.25 mg, Intravenous,  Once, 4 of 4 cycles Administration: 0.25 mg (10/28/2019), 0.25 mg (11/04/2019), 0.25 mg (11/11/2019), 0.25 mg (11/18/2019) PACLitaxel-protein bound (ABRAXANE) chemo infusion 200 mg, 100 mg/m2 = 200 mg (100 % of original dose 100 mg/m2), Intravenous,  Once, 4 of 4 cycles Dose modification: 100 mg/m2 (original dose 100 mg/m2, Cycle 2), 80 mg/m2 (original dose 100 mg/m2, Cycle 3, Reason: Other (see comments), Comment: neutropenia), 100 mg/m2 (original dose 100 mg/m2, Cycle 1) Administration: 200 mg (11/04/2019), 175 mg (11/11/2019), 200 mg (10/29/2019), 175 mg (11/18/2019) CARBOplatin (PARAPLATIN) 210 mg in sodium chloride 0.9 % 250 mL chemo infusion, 210 mg (100 % of original dose 214.6 mg), Intravenous,  Once, 4 of 4 cycles Dose modification:   (original dose 214.6 mg, Cycle 1) Administration: 210 mg (10/28/2019), 210 mg (11/04/2019), 210 mg (11/11/2019), 210 mg (11/18/2019) PACLitaxel (TAXOL) 90 mg in sodium  chloride 0.9 % 250 mL chemo infusion (</= 80mg /m2), 45 mg/m2 = 90 mg, Intravenous,  Once, 1 of 1 cycle Administration: 90 mg (10/28/2019)  for chemotherapy treatment.    11/25/2019 -  Chemotherapy   The patient had palonosetron (ALOXI) injection 0.25 mg, 0.25 mg, Intravenous,  Once, 1 of 1 cycle Administration: 0.25 mg (11/25/2019) PEMEtrexed (ALIMTA) 1,000 mg in sodium chloride 0.9 % 100 mL chemo infusion, 500 mg/m2 = 1,000 mg, Intravenous,  Once, 1 of 1 cycle Administration: 1,000 mg (11/25/2019) CARBOplatin (PARAPLATIN) 550 mg in sodium chloride 0.9 % 250 mL chemo infusion, 550 mg (100 % of original dose 548 mg), Intravenous,  Once, 1 of 1 cycle Dose modification:   (original dose 548 mg, Cycle 1) Administration: 550 mg (11/25/2019) fosaprepitant (EMEND) 150 mg in sodium chloride 0.9 % 145 mL IVPB, 150 mg, Intravenous,  Once, 1 of 1 cycle Administration: 150 mg (11/25/2019)  for chemotherapy treatment.      Interval history- Mitchell Frye is a 69 y.o. male who complains of oral tenderness and esophageal pain with swallowing. Associated signs and symptoms include: pain while swallowing. He has had these symptoms since starting radiation. Describes as painful all the time but worse when swallowing. Describes as moderate in severity. He has been taking the carafate prescribed by radiation oncology and pain medication, oxycodone, but does not feel symptoms have improved. He has held radiation d/t symptoms as well without improvement. He denies a history of chills, fevers, nausea and vomiting.  He has not tried over-the-counter medications. He also takes protonix. Denies benefit.   ECOG FS:1 - Symptomatic but completely ambulatory  Review of systems- Review  of Systems  Constitutional: Positive for malaise/fatigue. Negative for chills, fever and weight loss.  HENT: Positive for sore throat. Negative for hearing loss, nosebleeds and tinnitus.   Eyes: Negative for blurred vision and double  vision.  Respiratory: Positive for cough. Negative for hemoptysis, shortness of breath and wheezing.   Cardiovascular: Positive for chest pain. Negative for palpitations and leg swelling.  Gastrointestinal: Negative for abdominal pain, blood in stool, constipation, diarrhea, melena, nausea and vomiting.  Genitourinary: Negative for dysuria and urgency.  Musculoskeletal: Negative for back pain, falls, joint pain and myalgias.  Skin: Negative for itching and rash.  Neurological: Negative for dizziness, tingling, sensory change, loss of consciousness, weakness and headaches.  Endo/Heme/Allergies: Negative for environmental allergies. Does not bruise/bleed easily.  Psychiatric/Behavioral: Negative for depression. The patient is not nervous/anxious and does not have insomnia.      Current treatment- concurrent radiation and chemotherapy  Allergies  Allergen Reactions  . Taxol [Paclitaxel] Other (See Comments)    Chest pain, hip pain, coughing , wheezing    Past Medical History:  Diagnosis Date  . Dyspnea   . Hyperlipidemia   . Hypertension     Past Surgical History:  Procedure Laterality Date  . PORTA CATH INSERTION N/A 10/22/2019   Procedure: PORTA CATH INSERTION;  Surgeon: Katha Cabal, MD;  Location: [facility];  Service: Cardiovascular;  Laterality: N/A;  . VIDEO BRONCHOSCOPY WITH ENDOBRONCHIAL ULTRASOUND N/A 09/25/2019   Procedure: VIDEO BRONCHOSCOPY WITH ENDOBRONCHIAL ULTRASOUND;  Surgeon: Tyler Pita, MD;  Location: [facility];  Service: Pulmonary;  Laterality: N/A;    Social History   Socioeconomic History  . Marital status: Single    Spouse name: Not on file  . Number of children: Not on file  . Years of education: Not on file  . Highest education level: Not on file  Occupational History  . Not on file  Tobacco Use  . Smoking status: Former Smoker    Packs/day: 1.00    Years: 20.00    Pack years: 20.00    Types: Cigarettes    Quit date: 1994     Years since quitting: 27.8  . Smokeless tobacco: Never Used  Vaping Use  . Vaping Use: Never used  Substance and Sexual Activity  . Alcohol use: Yes    Comment: occ  . Drug use: Never  . Sexual activity: Not on file  Other Topics Concern  . Not on file  Social History Narrative  . Not on file   Social Determinants of Health   Financial Resource Strain:   . Difficulty of Paying Living Expenses: Not on file  Food Insecurity:   . Worried About Charity fundraiser in the Last Year: Not on file  . Ran Out of Food in the Last Year: Not on file  Transportation Needs:   . Lack of Transportation (Medical): Not on file  . Lack of Transportation (Non-Medical): Not on file  Physical Activity:   . Days of Exercise per Week: Not on file  . Minutes of Exercise per Session: Not on file  Stress:   . Feeling of Stress : Not on file  Social Connections:   . Frequency of Communication with Friends and Family: Not on file  . Frequency of Social Gatherings with Friends and Family: Not on file  . Attends Religious Services: Not on file  . Active Member of Clubs or Organizations: Not on file  . Attends Archivist Meetings: Not on file  .  Marital Status: Not on file  Intimate Partner Violence:   . Fear of Current or Ex-Partner: Not on file  . Emotionally Abused: Not on file  . Physically Abused: Not on file  . Sexually Abused: Not on file    Family History  Problem Relation Age of Onset  . Cancer Brother      Current Outpatient Medications:  .  acetaminophen (TYLENOL) 500 MG tablet, Take 1,000 mg by mouth every 6 (six) hours as needed for mild pain, moderate pain or headache., Disp: , Rfl:  .  albuterol (VENTOLIN HFA) 108 (90 Base) MCG/ACT inhaler, Inhale 2 puffs into the lungs every 4 (four) hours as needed for wheezing or shortness of breath. , Disp: , Rfl:  .  ANORO ELLIPTA 62.5-25 MCG/INH AEPB, Inhale 1 puff into the lungs daily. , Disp: , Rfl:  .  benzonatate (TESSALON  PERLES) 100 MG capsule, Take 1 capsule (100 mg total) by mouth every 6 (six) hours as needed for cough., Disp: 90 capsule, Rfl: 0 .  famotidine (PEPCID) 20 MG tablet, Take 1 tablet (20 mg total) by mouth 2 (two) times daily., Disp: 60 tablet, Rfl: 1 .  folic acid (FOLVITE) 1 MG tablet, Take 1 tablet (1 mg total) by mouth daily., Disp: 30 tablet, Rfl: 3 .  gabapentin (NEURONTIN) 100 MG capsule, TAKE 1 CAPSULE(100 MG) BY MOUTH THREE TIMES DAILY, Disp: 90 capsule, Rfl: 0 .  oxyCODONE (OXY IR/ROXICODONE) 5 MG immediate release tablet, Take 1-2 tablets (5-10 mg total) by mouth every 4 (four) hours as needed for severe pain., Disp: 90 tablet, Rfl: 0 .  pantoprazole (PROTONIX) 40 MG tablet, Take 1 tablet (40 mg total) by mouth daily., Disp: 30 tablet, Rfl: 3 .  sucralfate (CARAFATE) 1 g tablet, Take 1 tablet (1 g total) by mouth 3 (three) times daily. Dissolve in 3-4 tbsp warm water, swish and swallow., Disp: 90 tablet, Rfl: 1 .  traZODone (DESYREL) 50 MG tablet, Take 1 tablet (50 mg total) by mouth at bedtime., Disp: 30 tablet, Rfl: 1  Physical exam:  Vitals:   12/02/19 1102  BP: 131/80  Pulse: 78  Resp: 18  Temp: 98.7 F (37.1 C)  TempSrc: Tympanic  SpO2: 100%  Weight: 185 lb (83.9 kg)   Physical Exam Constitutional:      General: He is not in acute distress.    Appearance: He is not ill-appearing.  HENT:     Head: Normocephalic.     Nose: No congestion or rhinorrhea.     Mouth/Throat:     Pharynx: No oropharyngeal exudate or posterior oropharyngeal erythema.  Eyes:     General: No scleral icterus.    Conjunctiva/sclera: Conjunctivae normal.  Neck:     Comments: Redness of skin at site of radiation- mild Cardiovascular:     Rate and Rhythm: Normal rate and regular rhythm.     Heart sounds: Normal heart sounds.  Pulmonary:     Effort: Pulmonary effort is normal. No respiratory distress.     Breath sounds: Normal breath sounds. No wheezing.  Musculoskeletal:     Cervical back: No  tenderness.  Skin:    General: Skin is warm and dry.  Neurological:     Mental Status: He is alert and oriented to person, place, and time.  Psychiatric:        Mood and Affect: Mood normal.        Behavior: Behavior normal.      CMP Latest Ref Rng & Units 11/25/2019  Glucose 70 - 99 mg/dL 108(H)  BUN 8 - 23 mg/dL 18  Creatinine 0.61 - 1.24 mg/dL 0.77  Sodium 135 - 145 mmol/L 140  Potassium 3.5 - 5.1 mmol/L 3.6  Chloride 98 - 111 mmol/L 110  CO2 22 - 32 mmol/L 24  Calcium 8.9 - 10.3 mg/dL 8.6(L)  Total Protein 6.5 - 8.1 g/dL 6.7  Total Bilirubin 0.3 - 1.2 mg/dL 0.7  Alkaline Phos 38 - 126 U/L 48  AST 15 - 41 U/L 15  ALT 0 - 44 U/L 11   CBC Latest Ref Rng & Units 11/25/2019  WBC 4.0 - 10.5 K/uL 5.2  Hemoglobin 13.0 - 17.0 g/dL 8.5(L)  Hematocrit 39 - 52 % 26.6(L)  Platelets 150 - 400 K/uL 239    No images are attached to the encounter.  No results found.  Assessment and plan- Patient is a 69 y.o. 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  Encouraged use of sucralfate as prescribed.  Provided with prescription for Magic mouthwash to swish and swallow.  We will also send in prescription for oxycodone 10 mg every 4 hours as needed.  Encouraged him to try soft foods that tend to avoid irritation and avoid acidic, salty, or dry foods.  Encouraged nutritional supplements and shakes.  I provided him with Ensure Juven samples which contain arginine and glutamine to aid in tissue healing.  If symptoms persist or are not improved could consider short course of steroids.  Continue radiation at this time.  Patient advised to notify the clinic if there is no improvement in symptoms or if symptoms worsen in next 3-4 days.   Visit Diagnosis 1. Radiation esophagitis     Patient expressed understanding and was in agreement with this plan. He also understands that He  can call clinic at any time with any questions, concerns, or complaints.   Thank you for allowing me to participate in the care of this very pleasant patient.   Beckey Rutter, DNP, AGNP-[facility] [place]

## 2019-12-02 NOTE — Progress Notes (Signed)
[facility] clinic for esophageal pain. Hurts to swallow foods. Eating soft foods. Drinking fluids. He needs something stronger for pain. Hard for him to sleep at night.

## 2019-12-03 ENCOUNTER — Ambulatory Visit
Admission: RE | Admit: 2019-12-03 | Discharge: 2019-12-03 | Disposition: A | Discharge status: Home / Self Care | Payer: Medicaid Other | Source: Ambulatory Visit | Attending: Radiation Oncology | Admitting: Radiation Oncology

## 2019-12-03 DIAGNOSIS — Z510 Encounter for antineoplastic radiation therapy: Secondary | ICD-10-CM | POA: Diagnosis not present

## 2019-12-04 ENCOUNTER — Other Ambulatory Visit: Payer: Self-pay | Admitting: *Deleted

## 2019-12-04 ENCOUNTER — Ambulatory Visit
Admission: RE | Admit: 2019-12-04 | Discharge: 2019-12-04 | Disposition: A | Discharge status: Home / Self Care | Payer: Medicaid Other | Source: Ambulatory Visit | Attending: Radiation Oncology | Admitting: Radiation Oncology

## 2019-12-04 ENCOUNTER — Telehealth: Payer: Self-pay | Admitting: *Deleted

## 2019-12-04 DIAGNOSIS — Z510 Encounter for antineoplastic radiation therapy: Secondary | ICD-10-CM | POA: Diagnosis not present

## 2019-12-04 NOTE — Telephone Encounter (Signed)
I have spoke to Mitchell Frye and she says that if the 2 tablets do not work in 35 min then he can have another oxycodone. She says that we can put him on a long acting med-duragesic patch- you put it on meaty area of his body. Can use upper arm or belly those are usually FAT OR MEATY PLACES. When she puts it on for first time it takes 16  hours to kick in and after that the patch will need to be changed every 72 hours. Sister said that [facility] need to [facility]. The medication was sent to Mitchell Frye to approve the patch. I told the sister that she should call [facility] in about 45 min and see how long it will take to be ready for pick up.

## 2019-12-04 NOTE — Telephone Encounter (Signed)
Patient sister called reporting that patient is in pain in his chest where he is being radiated and that nothing he has taken is helping the pain. She states that he is considering calling EMS to take him to the ER for the pain. She reports that she just gave him 2 Oxycodone tablets. Please advise

## 2019-12-05 ENCOUNTER — Other Ambulatory Visit: Payer: Self-pay | Admitting: *Deleted

## 2019-12-05 ENCOUNTER — Inpatient Hospital Stay: Payer: Medicaid Other

## 2019-12-05 ENCOUNTER — Other Ambulatory Visit: Payer: Self-pay

## 2019-12-05 ENCOUNTER — Inpatient Hospital Stay (HOSPITAL_BASED_OUTPATIENT_CLINIC_OR_DEPARTMENT_OTHER): Payer: Medicaid Other | Admitting: Oncology

## 2019-12-05 ENCOUNTER — Ambulatory Visit
Admission: RE | Admit: 2019-12-05 | Discharge: 2019-12-05 | Disposition: A | Discharge status: Home / Self Care | Payer: Medicaid Other | Source: Ambulatory Visit | Attending: Radiation Oncology | Admitting: Radiation Oncology

## 2019-12-05 VITALS — BP 119/73 | HR 99 | Temp 96.3°F | Resp 16 | Wt 185.7 lb

## 2019-12-05 DIAGNOSIS — D6481 Anemia due to antineoplastic chemotherapy: Secondary | ICD-10-CM

## 2019-12-05 DIAGNOSIS — C3432 Malignant neoplasm of lower lobe, left bronchus or lung: Secondary | ICD-10-CM

## 2019-12-05 DIAGNOSIS — E876 Hypokalemia: Secondary | ICD-10-CM | POA: Diagnosis not present

## 2019-12-05 DIAGNOSIS — D509 Iron deficiency anemia, unspecified: Secondary | ICD-10-CM

## 2019-12-05 DIAGNOSIS — K208 Other esophagitis without bleeding: Secondary | ICD-10-CM | POA: Diagnosis not present

## 2019-12-05 DIAGNOSIS — T451X5A Adverse effect of antineoplastic and immunosuppressive drugs, initial encounter: Secondary | ICD-10-CM

## 2019-12-05 DIAGNOSIS — T66XXXA Radiation sickness, unspecified, initial encounter: Secondary | ICD-10-CM

## 2019-12-05 DIAGNOSIS — Z5112 Encounter for antineoplastic immunotherapy: Secondary | ICD-10-CM | POA: Diagnosis not present

## 2019-12-05 DIAGNOSIS — Z510 Encounter for antineoplastic radiation therapy: Secondary | ICD-10-CM | POA: Diagnosis not present

## 2019-12-05 LAB — CBC WITH DIFFERENTIAL/PLATELET
Abs Immature Granulocytes: 0.02 10*3/uL (ref 0.00–0.07)
Basophils Absolute: 0 10*3/uL (ref 0.0–0.1)
Basophils Relative: 0 %
Eosinophils Absolute: 0 10*3/uL (ref 0.0–0.5)
Eosinophils Relative: 0 %
HCT: 27 % — ABNORMAL LOW (ref 39.0–52.0)
Hemoglobin: 8.6 g/dL — ABNORMAL LOW (ref 13.0–17.0)
Immature Granulocytes: 1 %
Lymphocytes Relative: 13 %
Lymphs Abs: 0.5 10*3/uL — ABNORMAL LOW (ref 0.7–4.0)
MCH: 25.5 pg — ABNORMAL LOW (ref 26.0–34.0)
MCHC: 31.9 g/dL (ref 30.0–36.0)
MCV: 80.1 fL (ref 80.0–100.0)
Monocytes Absolute: 0.9 10*3/uL (ref 0.1–1.0)
Monocytes Relative: 24 %
Neutro Abs: 2.3 10*3/uL (ref 1.7–7.7)
Neutrophils Relative %: 62 %
Platelets: 147 10*3/uL — ABNORMAL LOW (ref 150–400)
RBC: 3.37 MIL/uL — ABNORMAL LOW (ref 4.22–5.81)
RDW: 25.2 % — ABNORMAL HIGH (ref 11.5–15.5)
WBC: 3.7 10*3/uL — ABNORMAL LOW (ref 4.0–10.5)
nRBC: 0 % (ref 0.0–0.2)

## 2019-12-05 LAB — COMPREHENSIVE METABOLIC PANEL
ALT: 9 U/L (ref 0–44)
AST: 15 U/L (ref 15–41)
Albumin: 3.5 g/dL (ref 3.5–5.0)
Alkaline Phosphatase: 49 U/L (ref 38–126)
Anion gap: 10 (ref 5–15)
BUN: 19 mg/dL (ref 8–23)
CO2: 24 mmol/L (ref 22–32)
Calcium: 9 mg/dL (ref 8.9–10.3)
Chloride: 108 mmol/L (ref 98–111)
Creatinine, Ser: 0.91 mg/dL (ref 0.61–1.24)
GFR, Estimated: >60 mL/min (ref >60)
Glucose, Bld: 128 mg/dL — ABNORMAL HIGH (ref 70–99)
Potassium: 2.9 mmol/L — ABNORMAL LOW (ref 3.5–5.1)
Sodium: 142 mmol/L (ref 135–145)
Total Bilirubin: 0.7 mg/dL (ref 0.3–1.2)
Total Protein: 6.8 g/dL (ref 6.5–8.1)

## 2019-12-05 LAB — SAMPLE TO BLOOD BANK

## 2019-12-05 MED ORDER — SODIUM CHLORIDE 0.9% FLUSH
10.0000 mL | Freq: Once | INTRAVENOUS | Status: AC
  Administered 2019-12-05: 10 mL via INTRAVENOUS
  Filled 2019-12-05: qty 10

## 2019-12-05 MED ORDER — HEPARIN SOD (PORK) LOCK FLUSH 100 UNIT/ML IV SOLN
INTRAVENOUS | Status: AC
  Filled 2019-12-05: qty 5

## 2019-12-05 MED ORDER — POTASSIUM CHLORIDE 20 MEQ PO PACK: 20.0000 meq | PACK | Freq: Every day | ORAL | 0 refills | Status: DC

## 2019-12-05 MED ORDER — HEPARIN SOD (PORK) LOCK FLUSH 100 UNIT/ML IV SOLN
500.0000 [IU] | Freq: Once | INTRAVENOUS | Status: AC
  Administered 2019-12-05: 500 [IU] via INTRAVENOUS
  Filled 2019-12-05: qty 5

## 2019-12-05 MED ORDER — POTASSIUM CHLORIDE IN NACL 20-0.9 MEQ/L-% IV SOLN
Freq: Once | INTRAVENOUS | Status: AC
  Filled 2019-12-05: qty 1000

## 2019-12-05 MED ORDER — POTASSIUM CHLORIDE CRYS ER 20 MEQ PO TBCR: 20.0000 meq | EXTENDED_RELEASE_TABLET | Freq: Every day | ORAL | 0 refills | Status: DC

## 2019-12-05 MED ORDER — FENTANYL 12 MCG/HR TD PT72: 1.0000 | MEDICATED_PATCH | TRANSDERMAL | 0 refills | Status: DC

## 2019-12-06 ENCOUNTER — Ambulatory Visit
Admission: RE | Admit: 2019-12-06 | Discharge: 2019-12-06 | Disposition: A | Discharge status: Home / Self Care | Payer: Medicaid Other | Source: Ambulatory Visit | Attending: Radiation Oncology | Admitting: Radiation Oncology

## 2019-12-06 DIAGNOSIS — Z510 Encounter for antineoplastic radiation therapy: Secondary | ICD-10-CM | POA: Diagnosis not present

## 2019-12-09 ENCOUNTER — Ambulatory Visit
Admission: RE | Admit: 2019-12-09 | Discharge: 2019-12-09 | Disposition: A | Discharge status: Home / Self Care | Payer: Medicaid Other | Source: Ambulatory Visit | Attending: Radiation Oncology | Admitting: Radiation Oncology

## 2019-12-09 ENCOUNTER — Ambulatory Visit: Payer: Medicaid Other

## 2019-12-09 DIAGNOSIS — Z510 Encounter for antineoplastic radiation therapy: Secondary | ICD-10-CM | POA: Diagnosis not present

## 2019-12-09 NOTE — Progress Notes (Signed)
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 He underwent CT angios chest which did not show any PE. Soft tissue attenuation measuring 2.9 x 4 cm centered in the left hilum resulting in abrupt angulation and narrowing of the pulmonary arteries and the central left lower lobe airways. Additional ipsilateral hilar and subcarinal adenopathy. No contralateral adenopathy. Consolidative masslike opacity 3.6 x 3.1 cm in size and contiguous with more central perihilar soft tissue attenuation. Overall findings concerning for primary bronchogenic carcinoma.  Patient lives with his sister who is his main caregiver. He does not drive but is independent of his ADLs. Reports ongoing fatigue and occasional retrosternal chest pain. He has been evaluated by GI in the past for reflux as well. Appetieis fair and weight is stable.Denies any significant shortness of breath at this time.Patient is an ex-smoker and smoked for about 20 years but quit smoking back in 1994  PET CT scan showed hypermetabolic mass in the left lower lobe with mediastinal adenopathy and right paratracheal adenopathy. No evidence of distant metastatic disease. Pathology was consistent with non-small cell lung cancer favor adenocarcinoma. Cells were positive for TTF-1 and  negative for p40.  Patient started concurrent carbotaxol radiation and then had allergic reaction to Taxol for which she was switched to Abraxane.  Given the short supply of Abraxane patient received carbo Alimta midway through his concurrent chemoradiation  Interval history-patient received carbo Alimta on 11/18/2019.  He reports feeling fatigued and has ongoing radiation esophagitis for which she is on Protonix and Carafate as well as getting As needed oxycodone 10 mg every 4 hours as needed but states that his pain is still not being well controlled  ECOG PS- 1 Pain scale- 4 Opioid associated constipation- no  Review of systems- Review of Systems  Constitutional: Negative for chills, fever, malaise/fatigue and weight loss.  HENT: Negative for congestion, ear discharge and nosebleeds.   Eyes: Negative for blurred vision.  Respiratory: Negative for cough, hemoptysis, sputum production, shortness of breath and wheezing.   Cardiovascular: Negative for chest pain, palpitations, orthopnea and claudication.  Gastrointestinal: Positive for heartburn. Negative for abdominal pain, blood in stool, constipation, diarrhea, melena, nausea and vomiting.       Pain with swallowing  Genitourinary: Negative for dysuria, flank pain, frequency, hematuria and urgency.  Musculoskeletal: Negative for back pain, joint pain and myalgias.  Skin: Negative for rash.  Neurological: Negative for dizziness, tingling, focal weakness, seizures, weakness and headaches.  Endo/Heme/Allergies: Does not bruise/bleed easily.  Psychiatric/Behavioral: Negative for depression and suicidal ideas. The patient does not have insomnia.      Allergies  Allergen Reactions  . Taxol [Paclitaxel] Other (See Comments)    Chest pain, hip pain, coughing , wheezing     Past Medical History:  Diagnosis Date  . Dyspnea   . Hyperlipidemia   . Hypertension      Past Surgical History:  Procedure Laterality Date  .  PORTA CATH  INSERTION N/A 10/22/2019   Procedure: PORTA CATH INSERTION;  Surgeon: Katha Cabal, MD;  Location: [place] [facility];  Service: Cardiovascular;  Laterality: N/A;  . VIDEO BRONCHOSCOPY WITH ENDOBRONCHIAL ULTRASOUND N/A 09/25/2019   Procedure: VIDEO BRONCHOSCOPY WITH ENDOBRONCHIAL ULTRASOUND;  Surgeon: Tyler Pita, MD;  Location: [facility];  Service: Pulmonary;  Laterality: N/A;    Social History   Socioeconomic History  . Marital status: Single    Spouse name: Not on file  . Number of children: Not on file  . Years of education: Not on file  . Highest education level: Not on file  Occupational History  . Not on file  Tobacco Use  . Smoking status: Former Smoker    Packs/day: 1.00    Years: 20.00    Pack years: 20.00    Types: Cigarettes    Quit date: 1994    Years since quitting: 27.8  . Smokeless tobacco: Never Used  Vaping Use  . Vaping Use: Never used  Substance and Sexual Activity  . Alcohol use: Yes    Comment: occ  . Drug use: Never  . Sexual activity: Not on file  Other Topics Concern  . Not on file  Social History Narrative  . Not on file   Social Determinants of Health   Financial Resource Strain:   . Difficulty of Paying Living Expenses: Not on file  Food Insecurity:   . Worried About Charity fundraiser in the Last Year: Not on file  . Ran Out of Food in the Last Year: Not on file  Transportation Needs:   . Lack of Transportation (Medical): Not on file  . Lack of Transportation (Non-Medical): Not on file  Physical Activity:   . Days of Exercise per Week: Not on file  . Minutes of Exercise per Session: Not on file  Stress:   . Feeling of Stress : Not on file  Social Connections:   . Frequency of Communication with Friends and Family: Not on file  . Frequency of Social Gatherings with Friends and Family: Not on file  . Attends Religious Services: Not on file  . Active Member of Clubs or Organizations: Not on file  . Attends Theatre manager Meetings: Not on file  . Marital Status: Not on file  Intimate Partner Violence:   . Fear of Current or Ex-Partner: Not on file  . Emotionally Abused: Not on file  . Physically Abused: Not on file  . Sexually Abused: Not on file    Family History  Problem Relation Age of Onset  . Cancer Brother      Current Outpatient Medications:  .  acetaminophen (TYLENOL) 500 MG tablet, Take 1,000 mg by mouth every 6 (six) hours as needed for mild pain, moderate pain or headache., Disp: , Rfl:  .  albuterol (VENTOLIN HFA) 108 (90 Base) MCG/ACT inhaler, Inhale 2 puffs into the lungs every 4 (four) hours as needed for wheezing or shortness of breath. , Disp: , Rfl:  .  ANORO ELLIPTA 62.5-25 MCG/INH AEPB, Inhale 1 puff into the lungs daily. , Disp: , Rfl:  .  benzonatate (TESSALON PERLES) 100 MG capsule, Take 1 capsule (100 mg total) by mouth every 6 (six) hours as needed for cough., Disp: 90 capsule, Rfl: 0 .  famotidine (PEPCID) 20 MG tablet, Take 1 tablet (20 mg total) by mouth 2 (two) times daily., Disp: 60 tablet, Rfl: 1 .  fentaNYL (DURAGESIC) 12 MCG/HR, Place 1  patch onto the skin every 3 (three) days., Disp: 10 patch, Rfl: 0 .  folic acid (FOLVITE) 1 MG tablet, Take 1 tablet (1 mg total) by mouth daily., Disp: 30 tablet, Rfl: 3 .  gabapentin (NEURONTIN) 100 MG capsule, TAKE 1 CAPSULE(100 MG) BY MOUTH THREE TIMES DAILY, Disp: 90 capsule, Rfl: 0 .  oxyCODONE (OXY IR/ROXICODONE) 5 MG immediate release tablet, Take 2 tablets (10 mg total) by mouth every 4 (four) hours as needed for severe pain., Disp: 90 tablet, Rfl: 0 .  pantoprazole (PROTONIX) 40 MG tablet, Take 1 tablet (40 mg total) by mouth daily., Disp: 30 tablet, Rfl: 3 .  sucralfate (CARAFATE) 1 g tablet, Take 1 tablet (1 g total) by mouth 3 (three) times daily. Dissolve in 3-4 tbsp warm water, swish and swallow., Disp: 90 tablet, Rfl: 1 .  traZODone (DESYREL) 50 MG tablet, Take 1 tablet (50 mg total) by mouth at bedtime., Disp: 30  tablet, Rfl: 1 .  potassium chloride (KLOR-CON) 20 MEQ packet, Take 20 mEq by mouth daily., Disp: 10 packet, Rfl: 0  Physical exam:  Vitals:   12/05/19 1043  BP: 119/73  Pulse: 99  Resp: 16  Temp: (!) 96.3 F (35.7 C)  TempSrc: Tympanic  SpO2: 98%  Weight: 185 lb 11.2 oz (84.2 kg)   Physical Exam Constitutional:      General: He is not in acute distress. Cardiovascular:     Rate and Rhythm: Normal rate and regular rhythm.     Heart sounds: Normal heart sounds.  Pulmonary:     Effort: Pulmonary effort is normal.     Breath sounds: Normal breath sounds.  Abdominal:     General: Bowel sounds are normal.     Palpations: Abdomen is soft.  Skin:    General: Skin is warm and dry.  Neurological:     Mental Status: He is alert and oriented to person, place, and time.      CMP Latest Ref Rng & Units 12/05/2019  Glucose 70 - 99 mg/dL 128(H)  BUN 8 - 23 mg/dL 19  Creatinine 0.61 - 1.24 mg/dL 0.91  Sodium 135 - 145 mmol/L 142  Potassium 3.5 - 5.1 mmol/L 2.9(L)  Chloride 98 - 111 mmol/L 108  CO2 22 - 32 mmol/L 24  Calcium 8.9 - 10.3 mg/dL 9.0  Total Protein 6.5 - 8.1 g/dL 6.8  Total Bilirubin 0.3 - 1.2 mg/dL 0.7  Alkaline Phos 38 - 126 U/L 49  AST 15 - 41 U/L 15  ALT 0 - 44 U/L 9   CBC Latest Ref Rng & Units 12/05/2019  WBC 4.0 - 10.5 K/uL 3.7(L)  Hemoglobin 13.0 - 17.0 g/dL 8.6(L)  Hematocrit 39 - 52 % 27.0(L)  Platelets 150 - 400 K/uL 147(L)      Assessment and plan- Patient is a 69 y.o. 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 He is here for routine follow-up of lung cancer with ongoing concurrent chemoradiation  Patient received carbo Alimta chemotherapy on 11/18/2019 which will take him through 3 weeks of radiation and he does not require any further chemotherapy.  He is currently anemic with a hemoglobin of 8.6 but does not require blood transfusion at this time.  He will continue to take folic acid 1 mg p.o. daily.  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  He will continue as needed oxycodone and I have added a fentanyl patch 12 mcg daily  Repeat CT chest abdomen pelvis with contrast 2-1/2 weeks from now   Visit Diagnosis 1. 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)   2. Radiation esophagitis   3. Antineoplastic chemotherapy induced anemia   4. Hypokalemia      Dr. Randa Evens, MD, MPH [facility] [phone] 12/09/2019 10:51 AM

## 2019-12-10 ENCOUNTER — Ambulatory Visit
Admission: RE | Admit: 2019-12-10 | Discharge: 2019-12-10 | Disposition: A | Discharge status: Home / Self Care | Payer: Medicaid Other | Source: Ambulatory Visit | Attending: Radiation Oncology | Admitting: Radiation Oncology

## 2019-12-10 DIAGNOSIS — Z510 Encounter for antineoplastic radiation therapy: Secondary | ICD-10-CM | POA: Diagnosis not present

## 2019-12-11 ENCOUNTER — Ambulatory Visit
Admission: RE | Admit: 2019-12-11 | Discharge: 2019-12-11 | Disposition: A | Discharge status: Home / Self Care | Payer: Medicaid Other | Source: Ambulatory Visit | Attending: Radiation Oncology | Admitting: Radiation Oncology

## 2019-12-11 DIAGNOSIS — Z510 Encounter for antineoplastic radiation therapy: Secondary | ICD-10-CM | POA: Diagnosis not present

## 2019-12-16 ENCOUNTER — Ambulatory Visit: Payer: Medicaid Other | Admitting: Oncology

## 2019-12-16 ENCOUNTER — Other Ambulatory Visit: Payer: Medicaid Other

## 2019-12-16 ENCOUNTER — Ambulatory Visit: Payer: Medicaid Other

## 2019-12-17 ENCOUNTER — Other Ambulatory Visit: Payer: Self-pay

## 2019-12-17 ENCOUNTER — Ambulatory Visit
Admission: RE | Admit: 2019-12-17 | Discharge: 2019-12-17 | Disposition: A | Discharge status: Home / Self Care | Payer: Medicaid Other | Source: Ambulatory Visit | Attending: Oncology | Admitting: Oncology

## 2019-12-17 DIAGNOSIS — C3432 Malignant neoplasm of lower lobe, left bronchus or lung: Secondary | ICD-10-CM

## 2019-12-17 DIAGNOSIS — K208 Other esophagitis without bleeding: Secondary | ICD-10-CM

## 2019-12-17 DIAGNOSIS — T66XXXA Radiation sickness, unspecified, initial encounter: Secondary | ICD-10-CM | POA: Insufficient documentation

## 2019-12-17 HISTORY — DX: Malignant (primary) neoplasm, unspecified: C80.1

## 2019-12-17 MED ORDER — IOHEXOL 300 MG/ML  SOLN
100.0000 mL | Freq: Once | INTRAMUSCULAR | Status: AC | PRN
  Administered 2019-12-17: 100 mL via INTRAVENOUS

## 2019-12-19 ENCOUNTER — Other Ambulatory Visit: Payer: Self-pay

## 2019-12-19 ENCOUNTER — Inpatient Hospital Stay: Payer: Medicaid Other

## 2019-12-19 ENCOUNTER — Encounter: Payer: Self-pay | Admitting: Oncology

## 2019-12-19 ENCOUNTER — Inpatient Hospital Stay: Payer: Medicaid Other | Attending: Oncology

## 2019-12-19 ENCOUNTER — Inpatient Hospital Stay (HOSPITAL_BASED_OUTPATIENT_CLINIC_OR_DEPARTMENT_OTHER): Payer: Medicaid Other | Admitting: Oncology

## 2019-12-19 VITALS — BP 127/77 | HR 104 | Temp 97.9°F | Resp 18 | Wt 185.0 lb

## 2019-12-19 DIAGNOSIS — T451X5A Adverse effect of antineoplastic and immunosuppressive drugs, initial encounter: Secondary | ICD-10-CM | POA: Diagnosis not present

## 2019-12-19 DIAGNOSIS — Z79899 Other long term (current) drug therapy: Secondary | ICD-10-CM | POA: Diagnosis not present

## 2019-12-19 DIAGNOSIS — Z7189 Other specified counseling: Secondary | ICD-10-CM

## 2019-12-19 DIAGNOSIS — Z87891 Personal history of nicotine dependence: Secondary | ICD-10-CM | POA: Diagnosis not present

## 2019-12-19 DIAGNOSIS — E785 Hyperlipidemia, unspecified: Secondary | ICD-10-CM | POA: Insufficient documentation

## 2019-12-19 DIAGNOSIS — C3432 Malignant neoplasm of lower lobe, left bronchus or lung: Secondary | ICD-10-CM

## 2019-12-19 DIAGNOSIS — I700 Atherosclerosis of aorta: Secondary | ICD-10-CM | POA: Diagnosis not present

## 2019-12-19 DIAGNOSIS — Z5112 Encounter for antineoplastic immunotherapy: Secondary | ICD-10-CM | POA: Insufficient documentation

## 2019-12-19 DIAGNOSIS — R55 Syncope and collapse: Secondary | ICD-10-CM | POA: Diagnosis not present

## 2019-12-19 DIAGNOSIS — E876 Hypokalemia: Secondary | ICD-10-CM

## 2019-12-19 DIAGNOSIS — I119 Hypertensive heart disease without heart failure: Secondary | ICD-10-CM | POA: Insufficient documentation

## 2019-12-19 DIAGNOSIS — I082 Rheumatic disorders of both aortic and tricuspid valves: Secondary | ICD-10-CM | POA: Insufficient documentation

## 2019-12-19 DIAGNOSIS — D6481 Anemia due to antineoplastic chemotherapy: Secondary | ICD-10-CM | POA: Diagnosis not present

## 2019-12-19 LAB — COMPREHENSIVE METABOLIC PANEL
ALT: 12 U/L (ref 0–44)
AST: 16 U/L (ref 15–41)
Albumin: 3.6 g/dL (ref 3.5–5.0)
Alkaline Phosphatase: 51 U/L (ref 38–126)
Anion gap: 8 (ref 5–15)
BUN: 19 mg/dL (ref 8–23)
CO2: 24 mmol/L (ref 22–32)
Calcium: 9 mg/dL (ref 8.9–10.3)
Chloride: 107 mmol/L (ref 98–111)
Creatinine, Ser: 0.91 mg/dL (ref 0.61–1.24)
GFR, Estimated: >60 mL/min (ref >60)
Glucose, Bld: 134 mg/dL — ABNORMAL HIGH (ref 70–99)
Potassium: 3.2 mmol/L — ABNORMAL LOW (ref 3.5–5.1)
Sodium: 139 mmol/L (ref 135–145)
Total Bilirubin: 0.8 mg/dL (ref 0.3–1.2)
Total Protein: 7.2 g/dL (ref 6.5–8.1)

## 2019-12-19 LAB — CBC WITH DIFFERENTIAL/PLATELET
Abs Immature Granulocytes: 0.02 10*3/uL (ref 0.00–0.07)
Basophils Absolute: 0 10*3/uL (ref 0.0–0.1)
Basophils Relative: 0 %
Eosinophils Absolute: 0 10*3/uL (ref 0.0–0.5)
Eosinophils Relative: 1 %
HCT: 33.2 % — ABNORMAL LOW (ref 39.0–52.0)
Hemoglobin: 10.3 g/dL — ABNORMAL LOW (ref 13.0–17.0)
Immature Granulocytes: 1 %
Lymphocytes Relative: 22 %
Lymphs Abs: 0.7 10*3/uL (ref 0.7–4.0)
MCH: 26.1 pg (ref 26.0–34.0)
MCHC: 31 g/dL (ref 30.0–36.0)
MCV: 84.1 fL (ref 80.0–100.0)
Monocytes Absolute: 0.4 10*3/uL (ref 0.1–1.0)
Monocytes Relative: 12 %
Neutro Abs: 1.9 10*3/uL (ref 1.7–7.7)
Neutrophils Relative %: 64 %
Platelets: 227 10*3/uL (ref 150–400)
RBC: 3.95 MIL/uL — ABNORMAL LOW (ref 4.22–5.81)
RDW: 26.4 % — ABNORMAL HIGH (ref 11.5–15.5)
WBC: 3 10*3/uL — ABNORMAL LOW (ref 4.0–10.5)
nRBC: 0 % (ref 0.0–0.2)

## 2019-12-19 MED ORDER — SODIUM CHLORIDE 0.9 % IV SOLN
Freq: Once | INTRAVENOUS | Status: AC
  Filled 2019-12-19: qty 250

## 2019-12-19 MED ORDER — HEPARIN SOD (PORK) LOCK FLUSH 100 UNIT/ML IV SOLN
500.0000 [IU] | Freq: Once | INTRAVENOUS | Status: AC
  Administered 2019-12-19: 500 [IU] via INTRAVENOUS
  Filled 2019-12-19: qty 5

## 2019-12-19 MED ORDER — POTASSIUM CHLORIDE IN NACL 20-0.9 MEQ/L-% IV SOLN
Freq: Once | INTRAVENOUS | Status: DC
  Filled 2019-12-19: qty 1000

## 2019-12-19 MED ORDER — POTASSIUM CHLORIDE 20 MEQ/100ML IV SOLN
20.0000 meq | Freq: Once | INTRAVENOUS | Status: AC
  Administered 2019-12-19: 20 meq via INTRAVENOUS

## 2019-12-19 MED ORDER — HEPARIN SOD (PORK) LOCK FLUSH 100 UNIT/ML IV SOLN
INTRAVENOUS | Status: AC
  Filled 2019-12-19: qty 5

## 2019-12-19 NOTE — Progress Notes (Signed)
Pt tolerated infusion well with no complications. On discharge, pt states that he is not experiencing any dizziness at this time and has no complaints at this time. VSS. RN educated pt on the importance of notifying the clinic if any complications occur at home. Pt verbalized understanding and all questions answered at this time. Pt stable for discharge.   Treyana Sturgell [employer]

## 2019-12-19 NOTE — Progress Notes (Signed)
Patient reports dizziness, lightheadedness, and syncopal episodes x 2. Last syncopal episode was last week while at work per patient. Patient placed in wheelchair for transport. Dr. Janese Banks made aware.

## 2019-12-22 ENCOUNTER — Telehealth: Payer: Self-pay | Admitting: *Deleted

## 2019-12-22 NOTE — Progress Notes (Signed)
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 He underwent CT angios chest which did not show any PE. Soft tissue attenuation measuring 2.9 x 4 cm centered in the left hilum resulting in abrupt angulation and narrowing of the pulmonary arteries and the central left lower lobe airways. Additional ipsilateral hilar and subcarinal adenopathy. No contralateral adenopathy. Consolidative masslike opacity 3.6 x 3.1 cm in size and contiguous with more central perihilar soft tissue attenuation. Overall findings concerning for primary bronchogenic carcinoma.  Patient lives with his sister who is his main caregiver. He does not drive but is independent of his ADLs. Reports ongoing fatigue and occasional retrosternal chest pain. He has been evaluated by GI in the past for reflux as well. Appetieis fair and weight is stable.Denies any significant shortness of breath at this time.Patient is an ex-smoker and smoked for about 20 years but quit smoking back in 1994  PET CT scan showed hypermetabolic mass in the left lower lobe with mediastinal adenopathy and right paratracheal adenopathy. No evidence of distant metastatic disease. Pathology was consistent with non-small cell lung cancer favor adenocarcinoma. Cells were positive for TTF-1 and  negative for p40.  Patient started concurrent carbotaxol radiation and then had allergic reaction to Taxol for which she was switched to Abraxane.  Given the short supply of Abraxane patient received carbo Alimta midway through his concurrent chemoradiation  Interval history-patient currently reports fatigue after chemoradiation.  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  He is currently on as needed oxycodone and Carafate for the same.  He reports 2 episodes of syncope while he was working.  He reports feeling lightheaded and then having a blackout sensation.  Denies any chest pain or palpitations during these episodes.  No loss of consciousness or witnessed seizures by others.  ECOG PS- 1 Pain scale- 4 Opioid associated constipation- no  Review of systems- Review of Systems  Constitutional: Positive for weight loss. Negative for chills, fever and malaise/fatigue.  HENT: Negative for congestion, ear discharge and nosebleeds.   Eyes: Negative for blurred vision.  Respiratory: Negative for cough, hemoptysis, sputum production, shortness of breath and wheezing.   Cardiovascular: Negative for chest pain, palpitations, orthopnea and claudication.  Gastrointestinal: Negative for abdominal pain, blood in stool, constipation, diarrhea, heartburn, melena, nausea and vomiting.  Genitourinary: Negative for dysuria, flank pain, frequency, hematuria and urgency.  Musculoskeletal: Negative for back pain, joint pain and myalgias.  Skin: Negative for rash.  Neurological: Positive for dizziness. Negative for tingling, focal weakness, seizures, weakness and headaches.  Endo/Heme/Allergies: Does not bruise/bleed easily.  Psychiatric/Behavioral: Negative for depression and suicidal ideas. The patient does not have insomnia.       Allergies  Allergen Reactions  . Taxol [Paclitaxel] Other (See Comments)    Chest pain, hip pain, coughing , wheezing     Past Medical History:  Diagnosis Date  .  Cancer ([place])   . Dyspnea   .  Hyperlipidemia   . Hypertension      Past Surgical History:  Procedure Laterality Date  . PORTA CATH INSERTION N/A 10/22/2019   Procedure: PORTA CATH INSERTION;  Surgeon: Katha Cabal, MD;  Location: [place] [facility];  Service: Cardiovascular;  Laterality: N/A;  . VIDEO BRONCHOSCOPY WITH ENDOBRONCHIAL ULTRASOUND N/A 09/25/2019   Procedure: VIDEO BRONCHOSCOPY WITH ENDOBRONCHIAL ULTRASOUND;  Surgeon: Tyler Pita, MD;  Location: [facility];  Service: Pulmonary;  Laterality: N/A;    Social History   Socioeconomic History  . Marital status: Single    Spouse name: Not on file  . Number of children: Not on file  . Years of education: Not on file  . Highest education level: Not on file  Occupational History  . Not on file  Tobacco Use  . Smoking status: Former Smoker    Packs/day: 1.00    Years: 20.00    Pack years: 20.00    Types: Cigarettes    Quit date: 1994    Years since quitting: 27.8  . Smokeless tobacco: Never Used  Vaping Use  . Vaping Use: Never used  Substance and Sexual Activity  . Alcohol use: Yes    Comment: occ  . Drug use: Never  . Sexual activity: Not on file  Other Topics Concern  . Not on file  Social History Narrative  . Not on file   Social Determinants of Health   Financial Resource Strain:   . Difficulty of Paying Living Expenses: Not on file  Food Insecurity:   . Worried About Charity fundraiser in the Last Year: Not on file  . Ran Out of Food in the Last Year: Not on file  Transportation Needs:   . Lack of Transportation (Medical): Not on file  . Lack of Transportation (Non-Medical): Not on file  Physical Activity:   . Days of Exercise per Week: Not on file  . Minutes of Exercise per Session: Not on file  Stress:   . Feeling of Stress : Not on file  Social Connections:   . Frequency of Communication with Friends and Family: Not on file  . Frequency of Social Gatherings with Friends and Family:  Not on file  . Attends Religious Services: Not on file  . Active Member of Clubs or Organizations: Not on file  . Attends Archivist Meetings: Not on file  . Marital Status: Not on file  Intimate Partner Violence:   . Fear of Current or Ex-Partner: Not on file  . Emotionally Abused: Not on file  . Physically Abused: Not on file  . Sexually Abused: Not on file    Family History  Problem Relation Age of Onset  . Cancer Brother      Current Outpatient Medications:  .  acetaminophen (TYLENOL) 500 MG tablet, Take 1,000 mg by mouth every 6 (six) hours as needed for mild pain, moderate pain or headache., Disp: , Rfl:  .  albuterol (VENTOLIN HFA) 108 (90 Base) MCG/ACT inhaler, Inhale 2 puffs into the lungs every 4 (four) hours as needed for wheezing or shortness of breath. , Disp: , Rfl:  .  ANORO ELLIPTA 62.5-25 MCG/INH AEPB, Inhale 1 puff into the lungs daily. , Disp: , Rfl:  .  benzonatate (TESSALON PERLES) 100 MG capsule, Take 1 capsule (100 mg total) by mouth every 6 (six) hours as needed for cough., Disp: 90 capsule, Rfl: 0 .  famotidine (PEPCID) 20 MG tablet, Take 1 tablet (20 mg total) by  mouth 2 (two) times daily., Disp: 60 tablet, Rfl: 1 .  fentaNYL (DURAGESIC) 12 MCG/HR, Place 1 patch onto the skin every 3 (three) days., Disp: 10 patch, Rfl: 0 .  folic acid (FOLVITE) 1 MG tablet, Take 1 tablet (1 mg total) by mouth daily., Disp: 30 tablet, Rfl: 3 .  gabapentin (NEURONTIN) 100 MG capsule, TAKE 1 CAPSULE(100 MG) BY MOUTH THREE TIMES DAILY, Disp: 90 capsule, Rfl: 0 .  oxyCODONE (OXY IR/ROXICODONE) 5 MG immediate release tablet, Take 2 tablets (10 mg total) by mouth every 4 (four) hours as needed for severe pain., Disp: 90 tablet, Rfl: 0 .  pantoprazole (PROTONIX) 40 MG tablet, Take 1 tablet (40 mg total) by mouth daily., Disp: 30 tablet, Rfl: 3 .  potassium chloride (KLOR-CON) 20 MEQ packet, Take 20 mEq by mouth daily., Disp: 10 packet, Rfl: 0 .  sucralfate (CARAFATE) 1 g  tablet, Take 1 tablet (1 g total) by mouth 3 (three) times daily. Dissolve in 3-4 tbsp warm water, swish and swallow., Disp: 90 tablet, Rfl: 1 .  traZODone (DESYREL) 50 MG tablet, Take 1 tablet (50 mg total) by mouth at bedtime., Disp: 30 tablet, Rfl: 1  Physical exam:  Vitals:   12/19/19 0956  BP: 127/77  Pulse: (!) 104  Resp: 18  Temp: 97.9 F (36.6 C)  TempSrc: Tympanic  SpO2: 99%  Weight: 185 lb (83.9 kg)   Physical Exam Constitutional:      General: He is not in acute distress. Eyes:     Pupils: Pupils are equal, round, and reactive to light.  Cardiovascular:     Rate and Rhythm: Normal rate and regular rhythm.     Heart sounds: Normal heart sounds.  Pulmonary:     Effort: Pulmonary effort is normal.     Breath sounds: Normal breath sounds.  Abdominal:     General: Bowel sounds are normal.     Palpations: Abdomen is soft.  Skin:    General: Skin is warm and dry.  Neurological:     General: No focal deficit present.     Mental Status: He is alert and oriented to person, place, and time.     Coordination: Coordination normal.     Gait: Gait normal.      CMP Latest Ref Rng & Units 12/19/2019  Glucose 70 - 99 mg/dL 134(H)  BUN 8 - 23 mg/dL 19  Creatinine 0.61 - 1.24 mg/dL 0.91  Sodium 135 - 145 mmol/L 139  Potassium 3.5 - 5.1 mmol/L 3.2(L)  Chloride 98 - 111 mmol/L 107  CO2 22 - 32 mmol/L 24  Calcium 8.9 - 10.3 mg/dL 9.0  Total Protein 6.5 - 8.1 g/dL 7.2  Total Bilirubin 0.3 - 1.2 mg/dL 0.8  Alkaline Phos 38 - 126 U/L 51  AST 15 - 41 U/L 16  ALT 0 - 44 U/L 12   CBC Latest Ref Rng & Units 12/19/2019  WBC 4.0 - 10.5 K/uL 3.0(L)  Hemoglobin 13.0 - 17.0 g/dL 10.3(L)  Hematocrit 39 - 52 % 33.2(L)  Platelets 150 - 400 K/uL 227    No images are attached to the encounter.  CT CHEST ABDOMEN PELVIS W CONTRAST  Result Date: 12/17/2019 CLINICAL DATA:  Lung cancer.  Restaging. EXAM: CT CHEST, ABDOMEN, AND PELVIS WITH CONTRAST TECHNIQUE: Multidetector CT imaging of  the chest, abdomen and pelvis was performed following the standard protocol during bolus administration of intravenous contrast. CONTRAST:  188mL OMNIPAQUE IOHEXOL 300 MG/ML  SOLN COMPARISON:  Head CT 09/16/2019.  CTA chest 09/02/2019 FINDINGS: CT CHEST FINDINGS Cardiovascular: Heart size upper normal. No substantial pericardial effusion. Right Port-A-Cath tip is positioned in the right atrium. Mediastinum/Nodes: No mediastinal lymphadenopathy. Retro hilar left lower lobe pulmonary mass measures 4.9 x 3.5 cm today (image 32/series 3), compared to 5.1 x 3.6 cm (remeasured) previously. 13 mm short axis subcarinal node was 14 mm previously (remeasured). Nodal tissue again noted in the inferior left hilum no right hilar lymphadenopathy. There is no axillary lymphadenopathy. Lungs/Pleura: Left lower lobe central mass lesion, as above. Volume loss inferior left lower lobe peripheral to the lesion is similar to prior. No new pulmonary nodule or mass. No pleural effusion. Musculoskeletal: No worrisome lytic or sclerotic osseous abnormality. CT ABDOMEN PELVIS FINDINGS Hepatobiliary: Tiny hypodensity in the dome of liver is too small to characterize but likely benign. This was not definitely seen on prior studies, likely due to bolus timing/lack of contrast. There is no evidence for gallstones, gallbladder wall thickening, or pericholecystic fluid. No intrahepatic or extrahepatic biliary dilation. Pancreas: 10 mm low-density lesion in the pancreas near the junction of body and tail was probably present on previous PET-CT although not well demonstrated due to lack of intravenous contrast. No associated main duct dilatation. No discernible hypermetabolism in this region on PET imaging. Spleen: No splenomegaly. No focal mass lesion. Adrenals/Urinary Tract: No adrenal nodule or mass. Similar appearance 3.4 cm right renal cyst. Additional tiny hypoattenuating lesions in each kidney are too small to characterize but likely benign.  No evidence for hydroureter. The urinary bladder appears normal for the degree of distention. Stomach/Bowel: Stomach is unremarkable. No gastric wall thickening. No evidence of outlet obstruction. Duodenum is normally positioned as is the ligament of Treitz. No small bowel wall thickening. No small bowel dilatation. The terminal ileum is normal. The appendix is normal. No gross colonic mass. No colonic wall thickening. Vascular/Lymphatic: There is abdominal aortic atherosclerosis without aneurysm. There is no gastrohepatic or hepatoduodenal ligament lymphadenopathy. No retroperitoneal or mesenteric lymphadenopathy. No pelvic sidewall lymphadenopathy. Reproductive: Heterogeneous prostate gland again noted. Other: No intraperitoneal free fluid. Musculoskeletal: Small umbilical hernia noted. No worrisome lytic or sclerotic osseous abnormality. IMPRESSION: 1. Slight interval decrease in size of the central left lower lobe hilar/infrahilar pulmonary mass with mild interval decrease in mediastinal lymphadenopathy. 2. No evidence for metastatic disease in the abdomen or pelvis. 3. 10 mm low-density lesion in the pancreas near the junction of body and tail. No associated main duct dilatation. No discernible hypermetabolism in this region on previous PET-CT. Close attention on follow-up recommended. 4. Small umbilical hernia. 5. Aortic Atherosclerosis (ICD10-I70.0). Electronically Signed   By: Misty Stanley M.D.   On: 12/17/2019 13:09     Assessment and plan- Patient is a 69 y.o. 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  He is here to discuss CT scan results and further management  Patient is here with his sister today.I have discussed CT chest abdomen pelvis images independently and discussed findings with the patient which overall show partial response to treatment with some improvement in the size of the left lower lobe mass as well as improvement in mediastinal adenopathy.  He has completed  concurrent chemoradiation and would benefit from adjuvant maintenance durvalumab every 2 weeks for 1 year.  Discussed risks and benefits of durvalumab including all but not limited to autoimmune side effect such as colitis, pneumonitis, thyroiditis and need to monitor liver and kidney functions.  Patient is concerned about his transportation for these follow-up visits  and we will look into that.  Patient and his sister would like to wait until after Thanksgiving to start this treatment.  Treatment will be given with a curative intent  Syncope: Etiology unclear.  I would ideally I would like to get echocardiogram and carotid Doppler to begin with.  However patient is hesitant to proceed with any scans at this time.  We will plan to get an echocardiogram the day he comes for his first durvalumab treatment.  Patient understands that if he gets repeated episodes of syncope he will need to go to ER for further evaluation  Chemo-induced anemia: Currently stable continue to monitor.    He will receive 1 L of IV fluids today   Visit Diagnosis 1. Encounter for antineoplastic immunotherapy   2. Malignant neoplasm of lower lobe of left lung ([place])   3. Goals of care, counseling/discussion   4. Syncope, unspecified syncope type      Dr. Randa Evens, MD, MPH [facility] [phone] 12/22/2019 3:53 PM              '

## 2019-12-22 NOTE — Telephone Encounter (Signed)
I called on Friday and spoke to pt's sister. She had wanted to have a break for her and the pt. She said she can't bring him to all appts. We agreed that she would take him to scans and we would set up transportation for chemo. She was agreeable. Then I called back later to tell her that we have ct 11/29 and the followed with ct of head after that on 11/29, then 11/30 for port labs and see md and start durvalumab. The scheduler set up transportation for both of the appts. Then sister said she will bring him to both of them. We cancelled Josh borders appt 11/17 and added it on to 11/30 when pt is here for treatment. Then sister asked if we can arrange the chrystal appt on same day. I called radiation but they had already left her the day and will check back Monday and let sister know. She is agreeable to plan

## 2019-12-23 ENCOUNTER — Inpatient Hospital Stay: Payer: Medicaid Other

## 2019-12-23 NOTE — Progress Notes (Signed)
Nutrition Follow-up:   Patient with adenocarcinoma of right lung.  Patient has completed chemotherapy and radiation therapy.  Planning to start durvalumab on 11/30.  Spoke with patient via phone for nutrition follow-up. Patient reports that his appetite is better.  Reports yesterday ate egg with cheese and bacon and drank lemonade for breakfast. Lunch was chicken wing and thigh with biscuit and pinto beans and coconut pie.  Supper was a pork chop and fudge round.  Denies nausea.  Reports that he is swallowing better.  Has trouble with itching when going to sleep.  MD is aware and has cream and medications.      Medications: reviewed  Labs: reviewed  Anthropometrics:   Weight 185 lb decreased from 189 lb on 10/4   NUTRITION DIAGNOSIS: Inadequate oral intake improving    INTERVENTION:  Encouraged good sources of protein at every meal.  Patient to follow-up with MD regarding itching.       MONITORING, EVALUATION, GOAL: weight trends, intake   NEXT VISIT: Dec 13 phone f/u  Joye Wesenberg B. Zenia Resides, [place], [place] Registered Dietitian [phone] (mobile)

## 2020-01-01 ENCOUNTER — Inpatient Hospital Stay: Payer: Medicaid Other | Admitting: Hospice and Palliative Medicine

## 2020-01-12 ENCOUNTER — Other Ambulatory Visit: Payer: Self-pay | Admitting: *Deleted

## 2020-01-12 DIAGNOSIS — C3432 Malignant neoplasm of lower lobe, left bronchus or lung: Secondary | ICD-10-CM

## 2020-01-13 ENCOUNTER — Other Ambulatory Visit: Payer: Medicaid Other

## 2020-01-13 ENCOUNTER — Ambulatory Visit
Admission: RE | Admit: 2020-01-13 | Discharge: 2020-01-13 | Disposition: A | Discharge status: Home / Self Care | Payer: Medicaid Other | Source: Ambulatory Visit | Attending: Oncology | Admitting: Oncology

## 2020-01-13 ENCOUNTER — Ambulatory Visit: Admission: RE | Admit: 2020-01-13 | Payer: Medicaid Other | Source: Ambulatory Visit

## 2020-01-13 ENCOUNTER — Other Ambulatory Visit: Payer: Self-pay | Admitting: Oncology

## 2020-01-13 ENCOUNTER — Other Ambulatory Visit: Payer: Self-pay

## 2020-01-13 DIAGNOSIS — R55 Syncope and collapse: Secondary | ICD-10-CM

## 2020-01-13 DIAGNOSIS — C3432 Malignant neoplasm of lower lobe, left bronchus or lung: Secondary | ICD-10-CM | POA: Insufficient documentation

## 2020-01-13 DIAGNOSIS — I082 Rheumatic disorders of both aortic and tricuspid valves: Secondary | ICD-10-CM | POA: Diagnosis not present

## 2020-01-13 DIAGNOSIS — E785 Hyperlipidemia, unspecified: Secondary | ICD-10-CM | POA: Diagnosis not present

## 2020-01-13 DIAGNOSIS — I10 Essential (primary) hypertension: Secondary | ICD-10-CM | POA: Diagnosis not present

## 2020-01-13 DIAGNOSIS — Z0189 Encounter for other specified special examinations: Secondary | ICD-10-CM

## 2020-01-13 LAB — ECHOCARDIOGRAM COMPLETE
AR max vel: 2.86 cm2
AV Area VTI: 2.4 cm2
AV Area mean vel: 2.51 cm2
AV Mean grad: 5 mmHg
AV Peak grad: 9.4 mmHg
Ao pk vel: 1.53 m/s
Area-P 1/2: 2.92 cm2
S' Lateral: 2.95 cm

## 2020-01-13 MED ORDER — IOHEXOL 300 MG/ML  SOLN
75.0000 mL | Freq: Once | INTRAMUSCULAR | Status: AC | PRN
  Administered 2020-01-13: 75 mL via INTRAVENOUS

## 2020-01-13 NOTE — Progress Notes (Signed)
DISCONTINUE ON PATHWAY REGIMEN - Non-Small Cell Lung     Administer weekly:     Paclitaxel      Carboplatin   **Always confirm dose/schedule in your pharmacy ordering system**  REASON: Continuation Of Treatment PRIOR TREATMENT: [id number]: Carboplatin AUC=2 + Paclitaxel 45 mg/m2 Weekly During Radiation TREATMENT RESPONSE: Partial Response (PR)  START ON PATHWAY REGIMEN - Non-Small Cell Lung     A cycle is every 14 days:     Durvalumab   **Always confirm dose/schedule in your pharmacy ordering system**  Patient Characteristics: Preoperative or Nonsurgical Candidate (Clinical Staging), [name] - Nonsurgical Candidate (Nonsquamous and Squamous), PS = 0, 1 Therapeutic Status: Preoperative or Nonsurgical Candidate (Clinical Staging) AJCC T Category: cT2b AJCC N Category: cN3 AJCC M Category: cM0 AJCC 8 Stage Grouping: IIIB ECOG Performance Status: 1 Intent of Therapy: Curative Intent, Discussed with Patient

## 2020-01-13 NOTE — Progress Notes (Signed)
*  PRELIMINARY RESULTS* Echocardiogram 2D Echocardiogram has been performed.  Mitchell Frye 01/13/2020, 11:50 AM

## 2020-01-14 ENCOUNTER — Inpatient Hospital Stay (HOSPITAL_BASED_OUTPATIENT_CLINIC_OR_DEPARTMENT_OTHER): Payer: Medicaid Other | Admitting: Hospice and Palliative Medicine

## 2020-01-14 ENCOUNTER — Inpatient Hospital Stay (HOSPITAL_BASED_OUTPATIENT_CLINIC_OR_DEPARTMENT_OTHER): Payer: Medicaid Other | Admitting: Oncology

## 2020-01-14 ENCOUNTER — Inpatient Hospital Stay: Payer: Medicaid Other

## 2020-01-14 ENCOUNTER — Encounter: Payer: Self-pay | Admitting: Oncology

## 2020-01-14 VITALS — BP 141/90 | HR 90 | Temp 98.6°F | Wt 188.1 lb

## 2020-01-14 DIAGNOSIS — Z5112 Encounter for antineoplastic immunotherapy: Secondary | ICD-10-CM | POA: Diagnosis not present

## 2020-01-14 DIAGNOSIS — C3432 Malignant neoplasm of lower lobe, left bronchus or lung: Secondary | ICD-10-CM

## 2020-01-14 DIAGNOSIS — Z515 Encounter for palliative care: Secondary | ICD-10-CM

## 2020-01-14 DIAGNOSIS — D6481 Anemia due to antineoplastic chemotherapy: Secondary | ICD-10-CM

## 2020-01-14 DIAGNOSIS — T451X5A Adverse effect of antineoplastic and immunosuppressive drugs, initial encounter: Secondary | ICD-10-CM

## 2020-01-14 DIAGNOSIS — R55 Syncope and collapse: Secondary | ICD-10-CM

## 2020-01-14 LAB — CBC WITH DIFFERENTIAL/PLATELET
Abs Immature Granulocytes: 0.01 10*3/uL (ref 0.00–0.07)
Basophils Absolute: 0 10*3/uL (ref 0.0–0.1)
Basophils Relative: 0 %
Eosinophils Absolute: 0 10*3/uL (ref 0.0–0.5)
Eosinophils Relative: 1 %
HCT: 35.5 % — ABNORMAL LOW (ref 39.0–52.0)
Hemoglobin: 10.8 g/dL — ABNORMAL LOW (ref 13.0–17.0)
Immature Granulocytes: 0 %
Lymphocytes Relative: 27 %
Lymphs Abs: 1.3 10*3/uL (ref 0.7–4.0)
MCH: 25.7 pg — ABNORMAL LOW (ref 26.0–34.0)
MCHC: 30.4 g/dL (ref 30.0–36.0)
MCV: 84.3 fL (ref 80.0–100.0)
Monocytes Absolute: 0.5 10*3/uL (ref 0.1–1.0)
Monocytes Relative: 10 %
Neutro Abs: 3 10*3/uL (ref 1.7–7.7)
Neutrophils Relative %: 62 %
Platelets: 325 10*3/uL (ref 150–400)
RBC: 4.21 MIL/uL — ABNORMAL LOW (ref 4.22–5.81)
RDW: 21 % — ABNORMAL HIGH (ref 11.5–15.5)
WBC: 4.8 10*3/uL (ref 4.0–10.5)
nRBC: 0 % (ref 0.0–0.2)

## 2020-01-14 LAB — COMPREHENSIVE METABOLIC PANEL
ALT: 9 U/L (ref 0–44)
AST: 17 U/L (ref 15–41)
Albumin: 3.3 g/dL — ABNORMAL LOW (ref 3.5–5.0)
Alkaline Phosphatase: 52 U/L (ref 38–126)
Anion gap: 10 (ref 5–15)
BUN: 13 mg/dL (ref 8–23)
CO2: 21 mmol/L — ABNORMAL LOW (ref 22–32)
Calcium: 9.1 mg/dL (ref 8.9–10.3)
Chloride: 110 mmol/L (ref 98–111)
Creatinine, Ser: 0.83 mg/dL (ref 0.61–1.24)
GFR, Estimated: >60 mL/min (ref >60)
Glucose, Bld: 133 mg/dL — ABNORMAL HIGH (ref 70–99)
Potassium: 3.6 mmol/L (ref 3.5–5.1)
Sodium: 141 mmol/L (ref 135–145)
Total Bilirubin: 0.7 mg/dL (ref 0.3–1.2)
Total Protein: 7.4 g/dL (ref 6.5–8.1)

## 2020-01-14 LAB — TSH: TSH: 0.532 u[IU]/mL (ref 0.350–4.500)

## 2020-01-14 MED ORDER — ANORO ELLIPTA 62.5-25 MCG/INH IN AEPB
1.0000 | INHALATION_SPRAY | Freq: Every day | RESPIRATORY_TRACT | 2 refills | Status: DC
Start: 2020-01-14 — End: 2020-07-03

## 2020-01-14 MED ORDER — HEPARIN SOD (PORK) LOCK FLUSH 100 UNIT/ML IV SOLN
500.0000 [IU] | Freq: Once | INTRAVENOUS | Status: AC | PRN
  Administered 2020-01-14: 500 [IU]
  Filled 2020-01-14: qty 5

## 2020-01-14 MED ORDER — SODIUM CHLORIDE 0.9 % IV SOLN
Freq: Once | INTRAVENOUS | Status: AC
  Filled 2020-01-14: qty 250

## 2020-01-14 MED ORDER — SODIUM CHLORIDE 0.9 % IV SOLN
10.0000 mg/kg | Freq: Once | INTRAVENOUS | Status: AC
  Administered 2020-01-14: 860 mg via INTRAVENOUS
  Filled 2020-01-14: qty 10

## 2020-01-14 MED ORDER — SODIUM CHLORIDE 0.9% FLUSH
10.0000 mL | Freq: Once | INTRAVENOUS | Status: AC
  Administered 2020-01-14: 10 mL via INTRAVENOUS
  Filled 2020-01-14: qty 10

## 2020-01-14 MED ORDER — ALBUTEROL SULFATE HFA 108 (90 BASE) MCG/ACT IN AERS: 2.0000 | INHALATION_SPRAY | RESPIRATORY_TRACT | 2 refills | Status: DC | PRN

## 2020-01-14 NOTE — Progress Notes (Signed)
Pt tolerated infusion well. No s/s of distress or reaction noted. Pt stable at discharge.  

## 2020-01-14 NOTE — Progress Notes (Signed)
[place]  [phone] [phone]   Name: Mitchell Frye Date: 01/14/2020 MRN: [id number]  DOB: 07-20-1950  Patient Care Team: Center, [facility] as PCP - Lillia Mountain, [place], RN as Oncology Nurse Navigator    REASON FOR CONSULTATION: Mitchell Frye is a 69 y.o. 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  PET/CT showed hypermetabolic mass in the left lower lobe with mediastinal and right paratracheal adenopathy.  Pathology was consistent with adenocarcinoma.  Patient was started on concurrent chemoradiation with plan for maintenance immunotherapy.  He was referred to palliative care to help address goals and manage ongoing symptoms.  SOCIAL HISTORY:     reports that he quit smoking about 27 years ago. His smoking use included cigarettes. He has a 20.00 pack-year smoking history. He has never used smokeless tobacco. He reports current alcohol use. He reports that he does not use drugs.  Patient lives at home with his sister who is his primary caregiver.  He does not drive.  ADVANCE DIRECTIVES:  Does not have  CODE STATUS:   PAST MEDICAL HISTORY: Past Medical History:  Diagnosis Date  . Cancer ([place])   . Dyspnea   . Hyperlipidemia   . Hypertension     PAST SURGICAL HISTORY:  Past Surgical History:  Procedure Laterality Date  . PORTA CATH INSERTION N/A 10/22/2019   Procedure: PORTA CATH INSERTION;  Surgeon: Katha Cabal, MD;  Location: [place] [facility];  Service: Cardiovascular;  Laterality: N/A;  . VIDEO BRONCHOSCOPY WITH ENDOBRONCHIAL ULTRASOUND N/A 09/25/2019   Procedure: VIDEO BRONCHOSCOPY WITH ENDOBRONCHIAL ULTRASOUND;  Surgeon: Tyler Pita, MD;  Location: [facility];  Service: Pulmonary;  Laterality: N/A;    HEMATOLOGY/ONCOLOGY HISTORY:  Oncology History    Malignant neoplasm of lower lobe of left lung ([place])  10/04/2019 Initial Diagnosis   Malignant neoplasm of lower lobe of left lung ([address])   10/04/2019 Cancer Staging   Staging form: Lung, AJCC 8th Edition - Clinical stage from 10/04/2019: Stage IIIB (cT2b, cN3, cM0) - Signed by Sindy Guadeloupe, MD on 10/04/2019   10/28/2019 - 11/18/2019 Chemotherapy   The patient had dexamethasone (DECADRON) 4 MG tablet, 8 mg, Oral, Daily, 1 of 1 cycle, Start date: 10/04/2019, End date: -- palonosetron (ALOXI) injection 0.25 mg, 0.25 mg, Intravenous,  Once, 4 of 4 cycles Administration: 0.25 mg (10/28/2019), 0.25 mg (11/04/2019), 0.25 mg (11/11/2019), 0.25 mg (11/18/2019) PACLitaxel-protein bound (ABRAXANE) chemo infusion 200 mg, 100 mg/m2 = 200 mg (100 % of original dose 100 mg/m2), Intravenous,  Once, 4 of 4 cycles Dose modification: 100 mg/m2 (original dose 100 mg/m2, Cycle 2), 80 mg/m2 (original dose 100 mg/m2, Cycle 3, Reason: Other (see comments), Comment: neutropenia), 100 mg/m2 (original dose 100 mg/m2, Cycle 1) Administration: 200 mg (11/04/2019), 175 mg (11/11/2019), 200 mg (10/29/2019), 175 mg (11/18/2019) CARBOplatin (PARAPLATIN) 210 mg in sodium chloride 0.9 % 250 mL chemo infusion, 210 mg (100 % of original dose 214.6 mg), Intravenous,  Once, 4 of 4 cycles Dose modification:   (original dose 214.6 mg, Cycle 1) Administration: 210 mg (10/28/2019), 210 mg (11/04/2019), 210 mg (11/11/2019), 210 mg (11/18/2019) PACLitaxel (TAXOL) 90 mg in sodium chloride 0.9 % 250 mL chemo infusion (</= 80mg /m2), 45 mg/m2 = 90 mg, Intravenous,  Once, 1 of 1 cycle Administration: 90 mg (10/28/2019)  for chemotherapy treatment.    11/25/2019 - 11/25/2019 Chemotherapy  The patient had palonosetron (ALOXI) injection 0.25 mg, 0.25 mg, Intravenous,  Once, 1 of 1 cycle Administration: 0.25 mg (11/25/2019) PEMEtrexed (ALIMTA) 1,000 mg in sodium chloride 0.9 % 100 mL chemo infusion, 500 mg/m2 = 1,000 mg, Intravenous,  Once, 1 of 1  cycle Administration: 1,000 mg (11/25/2019) CARBOplatin (PARAPLATIN) 550 mg in sodium chloride 0.9 % 250 mL chemo infusion, 550 mg (100 % of original dose 548 mg), Intravenous,  Once, 1 of 1 cycle Dose modification:   (original dose 548 mg, Cycle 1) Administration: 550 mg (11/25/2019) fosaprepitant (EMEND) 150 mg in sodium chloride 0.9 % 145 mL IVPB, 150 mg, Intravenous,  Once, 1 of 1 cycle Administration: 150 mg (11/25/2019)  for chemotherapy treatment.    01/14/2020 -  Chemotherapy   The patient had durvalumab (IMFINZI) 860 mg in sodium chloride 0.9 % 100 mL chemo infusion, 10 mg/kg = 860 mg, Intravenous,  Once, 1 of 6 cycles  for chemotherapy treatment.      ALLERGIES:  is allergic to taxol [paclitaxel].  MEDICATIONS:  Current Outpatient Medications  Medication Sig Dispense Refill  . acetaminophen (TYLENOL) 500 MG tablet Take 1,000 mg by mouth every 6 (six) hours as needed for mild pain, moderate pain or headache.    . albuterol (VENTOLIN HFA) 108 (90 Base) MCG/ACT inhaler Inhale 2 puffs into the lungs every 4 (four) hours as needed for wheezing or shortness of breath.     Jearl Klinefelter ELLIPTA 62.5-25 MCG/INH AEPB Inhale 1 puff into the lungs daily.     . benzonatate (TESSALON PERLES) 100 MG capsule Take 1 capsule (100 mg total) by mouth every 6 (six) hours as needed for cough. 90 capsule 0  . famotidine (PEPCID) 20 MG tablet Take 1 tablet (20 mg total) by mouth 2 (two) times daily. 60 tablet 1  . fentaNYL (DURAGESIC) 12 MCG/HR Place 1 patch onto the skin every 3 (three) days. 10 patch 0  . folic acid (FOLVITE) 1 MG tablet Take 1 tablet (1 mg total) by mouth daily. 30 tablet 3  . gabapentin (NEURONTIN) 100 MG capsule TAKE 1 CAPSULE(100 MG) BY MOUTH THREE TIMES DAILY 90 capsule 0  . oxyCODONE (OXY IR/ROXICODONE) 5 MG immediate release tablet Take 2 tablets (10 mg total) by mouth every 4 (four) hours as needed for severe pain. 90 tablet 0  . pantoprazole (PROTONIX) 40 MG tablet Take 1 tablet  (40 mg total) by mouth daily. 30 tablet 3  . potassium chloride (KLOR-CON) 20 MEQ packet Take 20 mEq by mouth daily. 10 packet 0  . sucralfate (CARAFATE) 1 g tablet Take 1 tablet (1 g total) by mouth 3 (three) times daily. Dissolve in 3-4 tbsp warm water, swish and swallow. 90 tablet 1  . traZODone (DESYREL) 50 MG tablet Take 1 tablet (50 mg total) by mouth at bedtime. 30 tablet 1   No current facility-administered medications for this visit.   Facility-Administered Medications Ordered in Other Visits  Medication Dose Route Frequency Provider Last Rate Last Admin  . durvalumab (IMFINZI) 860 mg in sodium chloride 0.9 % 100 mL chemo infusion  10 mg/kg (Treatment Plan Recorded) Intravenous Once Sindy Guadeloupe, MD 117 mL/hr at 01/14/20 1147 860 mg at 01/14/20 1147  . heparin lock flush 100 unit/mL  500 Units Intracatheter Once PRN Sindy Guadeloupe, MD        VITAL SIGNS: There were no vitals taken for this visit. There were no vitals filed for this visit.  Estimated body mass index is 26.99  kg/m as calculated from the following:   Height as of 11/25/19: 5\' 10"  (1.778 m).   Weight as of an earlier encounter on 01/14/20: 188 lb 1.6 oz (85.3 kg).  LABS: CBC:    Component Value Date/Time   WBC 4.8 01/14/2020 0920   HGB 10.8 (L) 01/14/2020 0920   HCT 35.5 (L) 01/14/2020 0920   PLT 325 01/14/2020 0920   MCV 84.3 01/14/2020 0920   NEUTROABS 3.0 01/14/2020 0920   LYMPHSABS 1.3 01/14/2020 0920   MONOABS 0.5 01/14/2020 0920   EOSABS 0.0 01/14/2020 0920   BASOSABS 0.0 01/14/2020 0920   Comprehensive Metabolic Panel:    Component Value Date/Time   NA 141 01/14/2020 0920   K 3.6 01/14/2020 0920   CL 110 01/14/2020 0920   CO2 21 (L) 01/14/2020 0920   BUN 13 01/14/2020 0920   CREATININE 0.83 01/14/2020 0920   GLUCOSE 133 (H) 01/14/2020 0920   CALCIUM 9.1 01/14/2020 0920   AST 17 01/14/2020 0920   ALT 9 01/14/2020 0920   ALKPHOS 52 01/14/2020 0920   BILITOT 0.7 01/14/2020 0920   PROT 7.4  01/14/2020 0920   ALBUMIN 3.3 (L) 01/14/2020 0920    RADIOGRAPHIC STUDIES: CT Head W Wo Contrast  Result Date: 01/13/2020 CLINICAL DATA:  Lung cancer restaging after chemo. EXAM: CT HEAD WITHOUT AND WITH CONTRAST TECHNIQUE: Contiguous axial images were obtained from the base of the skull through the vertex without and with intravenous contrast CONTRAST:  88mL OMNIPAQUE IOHEXOL 300 MG/ML  SOLN COMPARISON:  CT head 10/10/2019 FINDINGS: Brain: Ventricle size and cerebral volume normal. Moderate white matter changes with patchy white matter hyperintensity bilaterally, stable. Negative for acute infarct, hemorrhage, mass. No enhancing metastatic deposits identified. Vascular: Negative for hyperdense vessel. Normal arterial and venous enhancement. Skull: Negative Sinuses/Orbits: Paranasal sinuses clear.  Normal orbit bilaterally. Other: None IMPRESSION: Stable CT head.  Negative for metastatic disease. Electronically Signed   By: Franchot Gallo M.D.   On: 01/13/2020 15:35   CT CHEST ABDOMEN PELVIS W CONTRAST  Result Date: 12/17/2019 CLINICAL DATA:  Lung cancer.  Restaging. EXAM: CT CHEST, ABDOMEN, AND PELVIS WITH CONTRAST TECHNIQUE: Multidetector CT imaging of the chest, abdomen and pelvis was performed following the standard protocol during bolus administration of intravenous contrast. CONTRAST:  140mL OMNIPAQUE IOHEXOL 300 MG/ML  SOLN COMPARISON:  Head CT 09/16/2019.  CTA chest 09/02/2019 FINDINGS: CT CHEST FINDINGS Cardiovascular: Heart size upper normal. No substantial pericardial effusion. Right Port-A-Cath tip is positioned in the right atrium. Mediastinum/Nodes: No mediastinal lymphadenopathy. Retro hilar left lower lobe pulmonary mass measures 4.9 x 3.5 cm today (image 32/series 3), compared to 5.1 x 3.6 cm (remeasured) previously. 13 mm short axis subcarinal node was 14 mm previously (remeasured). Nodal tissue again noted in the inferior left hilum no right hilar lymphadenopathy. There is no axillary  lymphadenopathy. Lungs/Pleura: Left lower lobe central mass lesion, as above. Volume loss inferior left lower lobe peripheral to the lesion is similar to prior. No new pulmonary nodule or mass. No pleural effusion. Musculoskeletal: No worrisome lytic or sclerotic osseous abnormality. CT ABDOMEN PELVIS FINDINGS Hepatobiliary: Tiny hypodensity in the dome of liver is too small to characterize but likely benign. This was not definitely seen on prior studies, likely due to bolus timing/lack of contrast. There is no evidence for gallstones, gallbladder wall thickening, or pericholecystic fluid. No intrahepatic or extrahepatic biliary dilation. Pancreas: 10 mm low-density lesion in the pancreas near the junction of body and tail was probably present on previous  PET-CT although not well demonstrated due to lack of intravenous contrast. No associated main duct dilatation. No discernible hypermetabolism in this region on PET imaging. Spleen: No splenomegaly. No focal mass lesion. Adrenals/Urinary Tract: No adrenal nodule or mass. Similar appearance 3.4 cm right renal cyst. Additional tiny hypoattenuating lesions in each kidney are too small to characterize but likely benign. No evidence for hydroureter. The urinary bladder appears normal for the degree of distention. Stomach/Bowel: Stomach is unremarkable. No gastric wall thickening. No evidence of outlet obstruction. Duodenum is normally positioned as is the ligament of Treitz. No small bowel wall thickening. No small bowel dilatation. The terminal ileum is normal. The appendix is normal. No gross colonic mass. No colonic wall thickening. Vascular/Lymphatic: There is abdominal aortic atherosclerosis without aneurysm. There is no gastrohepatic or hepatoduodenal ligament lymphadenopathy. No retroperitoneal or mesenteric lymphadenopathy. No pelvic sidewall lymphadenopathy. Reproductive: Heterogeneous prostate gland again noted. Other: No intraperitoneal free fluid.  Musculoskeletal: Small umbilical hernia noted. No worrisome lytic or sclerotic osseous abnormality. IMPRESSION: 1. Slight interval decrease in size of the central left lower lobe hilar/infrahilar pulmonary mass with mild interval decrease in mediastinal lymphadenopathy. 2. No evidence for metastatic disease in the abdomen or pelvis. 3. 10 mm low-density lesion in the pancreas near the junction of body and tail. No associated main duct dilatation. No discernible hypermetabolism in this region on previous PET-CT. Close attention on follow-up recommended. 4. Small umbilical hernia. 5. Aortic Atherosclerosis (ICD10-I70.0). Electronically Signed   By: Misty Stanley M.D.   On: 12/17/2019 13:09   ECHOCARDIOGRAM COMPLETE  Result Date: 01/13/2020    ECHOCARDIOGRAM REPORT   Patient Name:   MOSHE WENGER Date of Exam: 01/13/2020 Medical Rec #:  [id number]     Height:       70.0 in Accession #:    [id number]    Weight:       185.0 lb Date of Birth:  28-Feb-1950      BSA:          2.019 m Patient Age:    43 years      BP:           127/77 mmHg Patient Gender: M             HR:           71 bpm. Exam Location:  [facility] Procedure: 2D Echo, Color Doppler, Cardiac Doppler and Strain Analysis Indications:     Z09 Chemo  History:         Patient has no prior history of Echocardiogram examinations.                  Risk Factors:Hypertension and Dyslipidemia.  Sonographer:     Charmayne Sheer RDCS (AE) Referring Phys:  [id number] Weston Anna RAO Diagnosing Phys: Kathlyn Sacramento MD  Sonographer Comments: Global longitudinal strain was attempted. IMPRESSIONS  1. Left ventricular ejection fraction, by estimation, is 55 to 60%. The left ventricle has normal function. The left ventricle has no regional wall motion abnormalities. Left ventricular diastolic parameters were normal. The average left ventricular global longitudinal strain is 14.4 %. The global longitudinal strain is abnormal.  2. Right ventricular systolic function is normal. The right  ventricular size is normal. Tricuspid regurgitation signal is inadequate for assessing PA pressure.  3. Left atrial size was mildly dilated.  4. The mitral valve is normal in structure. No evidence of mitral valve regurgitation. No evidence of mitral stenosis.  5. The aortic valve is normal in structure.  Aortic valve regurgitation is mild. Mild aortic valve sclerosis is present, with no evidence of aortic valve stenosis.  6. The inferior vena cava is normal in size with greater than 50% respiratory variability, suggesting right atrial pressure of 3 mmHg. FINDINGS  Left Ventricle: Left ventricular ejection fraction, by estimation, is 55 to 60%. The left ventricle has normal function. The left ventricle has no regional wall motion abnormalities. The average left ventricular global longitudinal strain is 14.4 %. The  global longitudinal strain is abnormal. The left ventricular internal cavity size was normal in size. There is borderline left ventricular hypertrophy. Left ventricular diastolic parameters were normal. Right Ventricle: The right ventricular size is normal. No increase in right ventricular wall thickness. Right ventricular systolic function is normal. Tricuspid regurgitation signal is inadequate for assessing PA pressure. Left Atrium: Left atrial size was mildly dilated. Right Atrium: Right atrial size was normal in size. Pericardium: There is no evidence of pericardial effusion. Mitral Valve: The mitral valve is normal in structure. No evidence of mitral valve regurgitation. No evidence of mitral valve stenosis. MV peak gradient, 5.5 mmHg. The mean mitral valve gradient is 2.0 mmHg. Tricuspid Valve: The tricuspid valve is normal in structure. Tricuspid valve regurgitation is trivial. No evidence of tricuspid stenosis. Aortic Valve: The aortic valve is normal in structure. Aortic valve regurgitation is mild. Mild aortic valve sclerosis is present, with no evidence of aortic valve stenosis. Aortic valve  mean gradient measures 5.0 mmHg. Aortic valve peak gradient measures 9.4 mmHg. Aortic valve area, by VTI measures 2.40 cm. Pulmonic Valve: The pulmonic valve was normal in structure. Pulmonic valve regurgitation is not visualized. No evidence of pulmonic stenosis. Aorta: The aortic root is normal in size and structure. Venous: The inferior vena cava is normal in size with greater than 50% respiratory variability, suggesting right atrial pressure of 3 mmHg. IAS/Shunts: No atrial level shunt detected by color flow Doppler.  LEFT VENTRICLE PLAX 2D LVIDd:         4.40 cm  Diastology LVIDs:         2.95 cm  LV e' medial:    7.29 cm/s LV PW:         1.17 cm  LV E/e' medial:  12.6 LV IVS:        0.96 cm  LV e' lateral:   5.11 cm/s LVOT diam:     2.20 cm  LV E/e' lateral: 18.0 LV SV:         77 LV SV Index:   38       2D Longitudinal Strain LVOT Area:     3.80 cm 2D Strain GLS Avg:     14.4 %                          3D Volume EF:                         3D EF:        56 %                         LV EDV:       225 ml                         LV ESV:       100 ml  LV SV:        125 ml RIGHT VENTRICLE RV Basal diam:  3.22 cm LEFT ATRIUM             Index       RIGHT ATRIUM           Index LA diam:        4.00 cm 1.98 cm/m  RA Area:     13.60 cm LA Vol (A2C):   37.8 ml 18.72 ml/m RA Volume:   32.60 ml  16.14 ml/m LA Vol (A4C):   55.7 ml 27.58 ml/m LA Biplane Vol: 47.1 ml 23.33 ml/m  AORTIC VALVE                    PULMONIC VALVE AV Area (Vmax):    2.86 cm     PV Vmax:       1.26 m/s AV Area (Vmean):   2.51 cm     PV Vmean:      86.000 cm/s AV Area (VTI):     2.40 cm     PV VTI:        0.217 m AV Vmax:           153.00 cm/s  PV Peak grad:  6.4 mmHg AV Vmean:          107.000 cm/s PV Mean grad:  3.0 mmHg AV VTI:            0.320 m AV Peak Grad:      9.4 mmHg AV Mean Grad:      5.0 mmHg LVOT Vmax:         115.00 cm/s LVOT Vmean:        70.600 cm/s LVOT VTI:          0.202 m LVOT/AV VTI ratio: 0.63   AORTA Ao Root diam: 4.20 cm MITRAL VALVE MV Area (PHT): 2.92 cm     SHUNTS MV Peak grad:  5.5 mmHg     Systemic VTI:  0.20 m MV Mean grad:  2.0 mmHg     Systemic Diam: 2.20 cm MV Vmax:       1.17 m/s MV Vmean:      67.5 cm/s MV Decel Time: 260 msec MV E velocity: 92.00 cm/s MV A velocity: 118.00 cm/s MV E/A ratio:  0.78 Kathlyn Sacramento MD Electronically signed by Kathlyn Sacramento MD Signature Date/Time: 01/13/2020/12:16:16 PM    Final     PERFORMANCE STATUS (ECOG) : 1  Review of Systems Unless otherwise noted, a complete review of systems is negative.  Physical Exam General: NAD Pulmonary: Unlabored Extremities: no edema, no joint deformities Skin: no rashes Neurological: Weakness but otherwise nonfocal  IMPRESSION: Patient seen in infusion area.  Patient reports that his odynophagia and generalized pain has significantly improved.  He denies any significant changes or concerns today.  He reports his oral intake is poor.  However, weight has been overall stable over the last several months.  Performance status is reportedly stable.  Patient does have occasional shortness of breath but says that he is out of his inhalers and request refills.  PLAN: -Continue current scope of treatment -Continue oxycodone as needed -Refill inhalers -ACP and MOST form previously reviewed -Follow-up MyChart visit with me in 1 to 2 months  Case and plan discussed with Dr. Janese Banks  Patient expressed understanding and was in agreement with this plan. He also understands that He can call the clinic at any time with any questions,  concerns, or complaints.     Time Total: 15 minutes  Visit consisted of counseling and education dealing with the complex and emotionally intense issues of symptom management and palliative care in the setting of serious and potentially life-threatening illness.Greater than 50%  of this time was spent counseling and coordinating care related to the above assessment and plan.  Signed  by: Altha Harm, PhD, NP-C

## 2020-01-15 ENCOUNTER — Other Ambulatory Visit: Payer: Self-pay | Admitting: Oncology

## 2020-01-15 LAB — T4: T4, Total: 6 ug/dL (ref 4.5–12.0)

## 2020-01-15 NOTE — Progress Notes (Signed)
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 He underwent CT angios chest which did not show any PE. Soft tissue attenuation measuring 2.9 x 4 cm centered in the left hilum resulting in abrupt angulation and narrowing of the pulmonary arteries and the central left lower lobe airways. Additional ipsilateral hilar and subcarinal adenopathy. No contralateral adenopathy. Consolidative masslike opacity 3.6 x 3.1 cm in size and contiguous with more central perihilar soft tissue attenuation. Overall findings concerning for primary bronchogenic carcinoma.  Patient lives with his sister who is his main caregiver. He does not drive but is independent of his ADLs. Reports ongoing fatigue and occasional retrosternal chest pain. He has been evaluated by GI in the past for reflux as well. Appetieis fair and weight is stable.Denies any significant shortness of breath at this time.Patient is an ex-smoker and smoked for about 20 years but quit smoking back in 1994  PET CT scan showed hypermetabolic mass in the left lower lobe with mediastinal adenopathy and right paratracheal adenopathy. No evidence of distant metastatic disease. Pathology was consistent with non-small cell lung cancer favor adenocarcinoma. Cells were positive for TTF-1 and negative  for p40.  Patient started concurrent carbotaxol radiation and then had allergic reaction to Taxol for which she was switched to Abraxane. Given the short supply of Abraxane patient received carbo Alimta midway through his concurrent chemoradiation.  Scans post chemoradiation showed partial response and patient will be started on maintenance durvalumab   Interval history-patient reports that his burning midsternal pain has resolved.  He still does not have significant taste sensation.  Has not had any further episodes of syncope  ECOG PS- 1 Pain scale- 0   Review of systems- Review of Systems  Constitutional: Positive for malaise/fatigue. Negative for chills, fever and weight loss.       Lack of taste sensation and appetite  HENT: Negative for congestion, ear discharge and nosebleeds.   Eyes: Negative for blurred vision.  Respiratory: Negative for cough, hemoptysis, sputum production, shortness of breath and wheezing.   Cardiovascular: Negative for chest pain, palpitations, orthopnea and claudication.  Gastrointestinal: Negative for abdominal pain, blood in stool, constipation, diarrhea, heartburn, melena, nausea and vomiting.  Genitourinary: Negative for dysuria, flank pain, frequency, hematuria and urgency.  Musculoskeletal: Negative for back pain, joint pain and myalgias.  Skin: Negative for rash.  Neurological: Negative for dizziness, tingling, focal weakness, seizures, weakness and headaches.  Endo/Heme/Allergies: Does not bruise/bleed easily.  Psychiatric/Behavioral: Negative for depression and suicidal ideas. The patient does not have insomnia.      Allergies  Allergen Reactions  . Taxol [Paclitaxel] Other (See Comments)    Chest pain, hip pain, coughing , wheezing     Past Medical History:  Diagnosis Date  . Cancer ([place])   . Dyspnea   . Hyperlipidemia   . Hypertension      Past Surgical History:  Procedure Laterality Date  .  PORTA CATH INSERTION N/A 10/22/2019    Procedure: PORTA CATH INSERTION;  Surgeon: Katha Cabal, MD;  Location: [facility];  Service: Cardiovascular;  Laterality: N/A;  . VIDEO BRONCHOSCOPY WITH ENDOBRONCHIAL ULTRASOUND N/A 09/25/2019   Procedure: VIDEO BRONCHOSCOPY WITH ENDOBRONCHIAL ULTRASOUND;  Surgeon: Tyler Pita, MD;  Location: [facility];  Service: Pulmonary;  Laterality: N/A;    Social History   Socioeconomic History  . Marital status: Single    Spouse name: Not on file  . Number of children: Not on file  . Years of education: Not on file  . Highest education level: Not on file  Occupational History  . Not on file  Tobacco Use  . Smoking status: Former Smoker    Packs/day: 1.00    Years: 20.00    Pack years: 20.00    Types: Cigarettes    Quit date: 1994    Years since quitting: 27.9  . Smokeless tobacco: Never Used  Vaping Use  . Vaping Use: Never used  Substance and Sexual Activity  . Alcohol use: Yes    Comment: occ  . Drug use: Never  . Sexual activity: Not on file  Other Topics Concern  . Not on file  Social History Narrative  . Not on file   Social Determinants of Health   Financial Resource Strain:   . Difficulty of Paying Living Expenses: Not on file  Food Insecurity:   . Worried About Charity fundraiser in the Last Year: Not on file  . Ran Out of Food in the Last Year: Not on file  Transportation Needs:   . Lack of Transportation (Medical): Not on file  . Lack of Transportation (Non-Medical): Not on file  Physical Activity:   . Days of Exercise per Week: Not on file  . Minutes of Exercise per Session: Not on file  Stress:   . Feeling of Stress : Not on file  Social Connections:   . Frequency of Communication with Friends and Family: Not on file  . Frequency of Social Gatherings with Friends and Family: Not on file  . Attends Religious Services: Not on file  . Active Member of Clubs or Organizations: Not on file  . Attends Archivist Meetings: Not on file   . Marital Status: Not on file  Intimate Partner Violence:   . Fear of Current or Ex-Partner: Not on file  . Emotionally Abused: Not on file  . Physically Abused: Not on file  . Sexually Abused: Not on file    Family History  Problem Relation Age of Onset  . Cancer Brother      Current Outpatient Medications:  .  acetaminophen (TYLENOL) 500 MG tablet, Take 1,000 mg by mouth every 6 (six) hours as needed for mild pain, moderate pain or headache., Disp: , Rfl:  .  benzonatate (TESSALON PERLES) 100 MG capsule, Take 1 capsule (100 mg total) by mouth every 6 (six) hours as needed for cough., Disp: 90 capsule, Rfl: 0 .  fentaNYL (DURAGESIC) 12 MCG/HR, Place 1 patch onto the skin every 3 (three) days., Disp: 10 patch, Rfl: 0 .  folic acid (FOLVITE) 1 MG tablet, Take 1 tablet (1 mg total) by mouth daily., Disp: 30 tablet, Rfl: 3 .  gabapentin (NEURONTIN) 100 MG capsule, TAKE 1 CAPSULE(100 MG) BY MOUTH THREE TIMES DAILY, Disp: 90 capsule, Rfl: 0 .  oxyCODONE (OXY IR/ROXICODONE) 5 MG immediate release tablet, Take 2 tablets (10 mg total) by mouth every 4 (  four) hours as needed for severe pain., Disp: 90 tablet, Rfl: 0 .  pantoprazole (PROTONIX) 40 MG tablet, Take 1 tablet (40 mg total) by mouth daily., Disp: 30 tablet, Rfl: 3 .  potassium chloride (KLOR-CON) 20 MEQ packet, Take 20 mEq by mouth daily., Disp: 10 packet, Rfl: 0 .  sucralfate (CARAFATE) 1 g tablet, Take 1 tablet (1 g total) by mouth 3 (three) times daily. Dissolve in 3-4 tbsp warm water, swish and swallow., Disp: 90 tablet, Rfl: 1 .  albuterol (VENTOLIN HFA) 108 (90 Base) MCG/ACT inhaler, Inhale 2 puffs into the lungs every 4 (four) hours as needed for wheezing or shortness of breath., Disp: 1 each, Rfl: 2 .  ANORO ELLIPTA 62.5-25 MCG/INH AEPB, Inhale 1 puff into the lungs daily., Disp: 1 each, Rfl: 2 .  famotidine (PEPCID) 20 MG tablet, TAKE 1 TABLET(20 MG) BY MOUTH TWICE DAILY, Disp: 60 tablet, Rfl: 1 .  traZODone (DESYREL) 50 MG  tablet, TAKE 1 TABLET(50 MG) BY MOUTH AT BEDTIME, Disp: 30 tablet, Rfl: 1  Physical exam:  Vitals:   01/14/20 1017  BP: (!) 141/90  Pulse: 90  Temp: 98.6 F (37 C)  TempSrc: Tympanic  SpO2: 97%  Weight: 188 lb 1.6 oz (85.3 kg)   Physical Exam   CMP Latest Ref Rng & Units 01/14/2020  Glucose 70 - 99 mg/dL 133(H)  BUN 8 - 23 mg/dL 13  Creatinine 0.61 - 1.24 mg/dL 0.83  Sodium 135 - 145 mmol/L 141  Potassium 3.5 - 5.1 mmol/L 3.6  Chloride 98 - 111 mmol/L 110  CO2 22 - 32 mmol/L 21(L)  Calcium 8.9 - 10.3 mg/dL 9.1  Total Protein 6.5 - 8.1 g/dL 7.4  Total Bilirubin 0.3 - 1.2 mg/dL 0.7  Alkaline Phos 38 - 126 U/L 52  AST 15 - 41 U/L 17  ALT 0 - 44 U/L 9   CBC Latest Ref Rng & Units 01/14/2020  WBC 4.0 - 10.5 K/uL 4.8  Hemoglobin 13.0 - 17.0 g/dL 10.8(L)  Hematocrit 39 - 52 % 35.5(L)  Platelets 150 - 400 K/uL 325    No images are attached to the encounter.  CT Head W Wo Contrast  Result Date: 01/13/2020 CLINICAL DATA:  Lung cancer restaging after chemo. EXAM: CT HEAD WITHOUT AND WITH CONTRAST TECHNIQUE: Contiguous axial images were obtained from the base of the skull through the vertex without and with intravenous contrast CONTRAST:  21mL OMNIPAQUE IOHEXOL 300 MG/ML  SOLN COMPARISON:  CT head 10/10/2019 FINDINGS: Brain: Ventricle size and cerebral volume normal. Moderate white matter changes with patchy white matter hyperintensity bilaterally, stable. Negative for acute infarct, hemorrhage, mass. No enhancing metastatic deposits identified. Vascular: Negative for hyperdense vessel. Normal arterial and venous enhancement. Skull: Negative Sinuses/Orbits: Paranasal sinuses clear.  Normal orbit bilaterally. Other: None IMPRESSION: Stable CT head.  Negative for metastatic disease. Electronically Signed   By: Franchot Gallo M.D.   On: 01/13/2020 15:35   CT CHEST ABDOMEN PELVIS W CONTRAST  Result Date: 12/17/2019 CLINICAL DATA:  Lung cancer.  Restaging. EXAM: CT CHEST, ABDOMEN, AND  PELVIS WITH CONTRAST TECHNIQUE: Multidetector CT imaging of the chest, abdomen and pelvis was performed following the standard protocol during bolus administration of intravenous contrast. CONTRAST:  184mL OMNIPAQUE IOHEXOL 300 MG/ML  SOLN COMPARISON:  Head CT 09/16/2019.  CTA chest 09/02/2019 FINDINGS: CT CHEST FINDINGS Cardiovascular: Heart size upper normal. No substantial pericardial effusion. Right Port-A-Cath tip is positioned in the right atrium. Mediastinum/Nodes: No mediastinal lymphadenopathy. Retro hilar left lower  lobe pulmonary mass measures 4.9 x 3.5 cm today (image 32/series 3), compared to 5.1 x 3.6 cm (remeasured) previously. 13 mm short axis subcarinal node was 14 mm previously (remeasured). Nodal tissue again noted in the inferior left hilum no right hilar lymphadenopathy. There is no axillary lymphadenopathy. Lungs/Pleura: Left lower lobe central mass lesion, as above. Volume loss inferior left lower lobe peripheral to the lesion is similar to prior. No new pulmonary nodule or mass. No pleural effusion. Musculoskeletal: No worrisome lytic or sclerotic osseous abnormality. CT ABDOMEN PELVIS FINDINGS Hepatobiliary: Tiny hypodensity in the dome of liver is too small to characterize but likely benign. This was not definitely seen on prior studies, likely due to bolus timing/lack of contrast. There is no evidence for gallstones, gallbladder wall thickening, or pericholecystic fluid. No intrahepatic or extrahepatic biliary dilation. Pancreas: 10 mm low-density lesion in the pancreas near the junction of body and tail was probably present on previous PET-CT although not well demonstrated due to lack of intravenous contrast. No associated main duct dilatation. No discernible hypermetabolism in this region on PET imaging. Spleen: No splenomegaly. No focal mass lesion. Adrenals/Urinary Tract: No adrenal nodule or mass. Similar appearance 3.4 cm right renal cyst. Additional tiny hypoattenuating lesions in  each kidney are too small to characterize but likely benign. No evidence for hydroureter. The urinary bladder appears normal for the degree of distention. Stomach/Bowel: Stomach is unremarkable. No gastric wall thickening. No evidence of outlet obstruction. Duodenum is normally positioned as is the ligament of Treitz. No small bowel wall thickening. No small bowel dilatation. The terminal ileum is normal. The appendix is normal. No gross colonic mass. No colonic wall thickening. Vascular/Lymphatic: There is abdominal aortic atherosclerosis without aneurysm. There is no gastrohepatic or hepatoduodenal ligament lymphadenopathy. No retroperitoneal or mesenteric lymphadenopathy. No pelvic sidewall lymphadenopathy. Reproductive: Heterogeneous prostate gland again noted. Other: No intraperitoneal free fluid. Musculoskeletal: Small umbilical hernia noted. No worrisome lytic or sclerotic osseous abnormality. IMPRESSION: 1. Slight interval decrease in size of the central left lower lobe hilar/infrahilar pulmonary mass with mild interval decrease in mediastinal lymphadenopathy. 2. No evidence for metastatic disease in the abdomen or pelvis. 3. 10 mm low-density lesion in the pancreas near the junction of body and tail. No associated main duct dilatation. No discernible hypermetabolism in this region on previous PET-CT. Close attention on follow-up recommended. 4. Small umbilical hernia. 5. Aortic Atherosclerosis (ICD10-I70.0). Electronically Signed   By: Misty Stanley M.D.   On: 12/17/2019 13:09   ECHOCARDIOGRAM COMPLETE  Result Date: 01/13/2020    ECHOCARDIOGRAM REPORT   Patient Name:   MARQUIS DOWN Date of Exam: 01/13/2020 Medical Rec #:  [id number]     Height:       70.0 in Accession #:    [id number]    Weight:       185.0 lb Date of Birth:  Jun 25, 1950      BSA:          2.019 m Patient Age:    78 years      BP:           127/77 mmHg Patient Gender: M             HR:           71 bpm. Exam Location:  [facility] Procedure: 2D  Echo, Color Doppler, Cardiac Doppler and Strain Analysis Indications:     Z09 Chemo  History:         Patient has no prior history of Echocardiogram  examinations.                  Risk Factors:Hypertension and Dyslipidemia.  Sonographer:     Charmayne Sheer RDCS (AE) Referring Phys:  [id number] Weston Anna Tremel Setters Diagnosing Phys: Kathlyn Sacramento MD  Sonographer Comments: Global longitudinal strain was attempted. IMPRESSIONS  1. Left ventricular ejection fraction, by estimation, is 55 to 60%. The left ventricle has normal function. The left ventricle has no regional wall motion abnormalities. Left ventricular diastolic parameters were normal. The average left ventricular global longitudinal strain is 14.4 %. The global longitudinal strain is abnormal.  2. Right ventricular systolic function is normal. The right ventricular size is normal. Tricuspid regurgitation signal is inadequate for assessing PA pressure.  3. Left atrial size was mildly dilated.  4. The mitral valve is normal in structure. No evidence of mitral valve regurgitation. No evidence of mitral stenosis.  5. The aortic valve is normal in structure. Aortic valve regurgitation is mild. Mild aortic valve sclerosis is present, with no evidence of aortic valve stenosis.  6. The inferior vena cava is normal in size with greater than 50% respiratory variability, suggesting right atrial pressure of 3 mmHg. FINDINGS  Left Ventricle: Left ventricular ejection fraction, by estimation, is 55 to 60%. The left ventricle has normal function. The left ventricle has no regional wall motion abnormalities. The average left ventricular global longitudinal strain is 14.4 %. The  global longitudinal strain is abnormal. The left ventricular internal cavity size was normal in size. There is borderline left ventricular hypertrophy. Left ventricular diastolic parameters were normal. Right Ventricle: The right ventricular size is normal. No increase in right ventricular wall thickness. Right  ventricular systolic function is normal. Tricuspid regurgitation signal is inadequate for assessing PA pressure. Left Atrium: Left atrial size was mildly dilated. Right Atrium: Right atrial size was normal in size. Pericardium: There is no evidence of pericardial effusion. Mitral Valve: The mitral valve is normal in structure. No evidence of mitral valve regurgitation. No evidence of mitral valve stenosis. MV peak gradient, 5.5 mmHg. The mean mitral valve gradient is 2.0 mmHg. Tricuspid Valve: The tricuspid valve is normal in structure. Tricuspid valve regurgitation is trivial. No evidence of tricuspid stenosis. Aortic Valve: The aortic valve is normal in structure. Aortic valve regurgitation is mild. Mild aortic valve sclerosis is present, with no evidence of aortic valve stenosis. Aortic valve mean gradient measures 5.0 mmHg. Aortic valve peak gradient measures 9.4 mmHg. Aortic valve area, by VTI measures 2.40 cm. Pulmonic Valve: The pulmonic valve was normal in structure. Pulmonic valve regurgitation is not visualized. No evidence of pulmonic stenosis. Aorta: The aortic root is normal in size and structure. Venous: The inferior vena cava is normal in size with greater than 50% respiratory variability, suggesting right atrial pressure of 3 mmHg. IAS/Shunts: No atrial level shunt detected by color flow Doppler.  LEFT VENTRICLE PLAX 2D LVIDd:         4.40 cm  Diastology LVIDs:         2.95 cm  LV e' medial:    7.29 cm/s LV PW:         1.17 cm  LV E/e' medial:  12.6 LV IVS:        0.96 cm  LV e' lateral:   5.11 cm/s LVOT diam:     2.20 cm  LV E/e' lateral: 18.0 LV SV:         77 LV SV Index:   38  2D Longitudinal Strain LVOT Area:     3.80 cm 2D Strain GLS Avg:     14.4 %                          3D Volume EF:                         3D EF:        56 %                         LV EDV:       225 ml                         LV ESV:       100 ml                         LV SV:        125 ml RIGHT VENTRICLE RV Basal  diam:  3.22 cm LEFT ATRIUM             Index       RIGHT ATRIUM           Index LA diam:        4.00 cm 1.98 cm/m  RA Area:     13.60 cm LA Vol (A2C):   37.8 ml 18.72 ml/m RA Volume:   32.60 ml  16.14 ml/m LA Vol (A4C):   55.7 ml 27.58 ml/m LA Biplane Vol: 47.1 ml 23.33 ml/m  AORTIC VALVE                    PULMONIC VALVE AV Area (Vmax):    2.86 cm     PV Vmax:       1.26 m/s AV Area (Vmean):   2.51 cm     PV Vmean:      86.000 cm/s AV Area (VTI):     2.40 cm     PV VTI:        0.217 m AV Vmax:           153.00 cm/s  PV Peak grad:  6.4 mmHg AV Vmean:          107.000 cm/s PV Mean grad:  3.0 mmHg AV VTI:            0.320 m AV Peak Grad:      9.4 mmHg AV Mean Grad:      5.0 mmHg LVOT Vmax:         115.00 cm/s LVOT Vmean:        70.600 cm/s LVOT VTI:          0.202 m LVOT/AV VTI ratio: 0.63  AORTA Ao Root diam: 4.20 cm MITRAL VALVE MV Area (PHT): 2.92 cm     SHUNTS MV Peak grad:  5.5 mmHg     Systemic VTI:  0.20 m MV Mean grad:  2.0 mmHg     Systemic Diam: 2.20 cm MV Vmax:       1.17 m/s MV Vmean:      67.5 cm/s MV Decel Time: 260 msec MV E velocity: 92.00 cm/s MV A velocity: 118.00 cm/s MV E/A ratio:  0.78 Kathlyn Sacramento MD Electronically signed by Kathlyn Sacramento MD Signature Date/Time: 01/13/2020/12:16:16 PM    Final  Assessment and plan- Patient is a 69 y.o. male  withstage IIIBadenocarcinoma of the left lung cT2 cN3 cM0.  He is here for on treatment assessment prior to cycle 1 of maintenance durvalumab  Counts okay to proceed with cycle 1 of maintenance durvalumab today.  I will see him back in 2 weeks for cycle 2.  Again discussed risks and benefits of durvalumab including all but not limited to autoimmune side effect such as thyroiditis pneumonitis colitis and need to monitor kidney and liver functions.  Patient understands and agrees to proceed as planned.  Treatment is being given with a curative intent.  Chemo-induced anemia: Stable continue to monitor  Syncope: CT head with and  without contrast was normal and echocardiogram was normal as well.  Ideally patient needs a carotid Doppler as well as cardiology referral.  I have discussed all this with the patient but he does not want to pursue that at this time.  He is willing to consider it only if he has further episodes of syncope.   Visit Diagnosis 1. Encounter for antineoplastic immunotherapy   2. Syncope, unspecified syncope type   3. Malignant neoplasm of lower lobe of left lung ([place])   4. Antineoplastic chemotherapy induced anemia      Dr. Randa Evens, MD, MPH [facility] at [facility] [phone] 01/15/2020 11:02 AM

## 2020-01-16 ENCOUNTER — Encounter: Payer: Self-pay | Admitting: Radiation Oncology

## 2020-01-17 ENCOUNTER — Encounter: Payer: Self-pay | Admitting: Radiation Oncology

## 2020-01-17 ENCOUNTER — Ambulatory Visit
Admission: RE | Admit: 2020-01-17 | Discharge: 2020-01-17 | Disposition: A | Discharge status: Home / Self Care | Payer: Medicaid Other | Source: Ambulatory Visit | Attending: Radiation Oncology | Admitting: Radiation Oncology

## 2020-01-17 ENCOUNTER — Other Ambulatory Visit: Payer: Self-pay

## 2020-01-17 VITALS — BP 126/78 | HR 90 | Temp 98.1°F | Wt 186.0 lb

## 2020-01-17 DIAGNOSIS — C3432 Malignant neoplasm of lower lobe, left bronchus or lung: Secondary | ICD-10-CM

## 2020-01-17 NOTE — Progress Notes (Signed)
Radiation Oncology Follow up Note  Name: Mitchell Frye   Date:   01/17/2020 MRN:  [id number] DOB: 11-24-1950    This 69 y.o. male presents to the clinic today for 1 month follow-up status post concurrent chemoradiation therapy for stage IIIb (T2 N3 M0) non-small cell lung cancer of the left hilum presumed to be adenocarcinoma.Marland Kitchen  REFERRING PROVIDER: Center, [employer]*  HPI: Patient is a 69 year old male now out 1 month have completed concurrent chemoradiation therapy for stage IIIb presumed adenocarcinoma the left hilum seen today in routine follow-up he is doing fairly well he does have some thick secretions when he coughs.  No hemoptysis.  No chest tightness.Marland Kitchen  He currently is under treatment withmaintenance durvalumab which she is tolerating well.  Patient did have a CT scan back in early November showing slight interval decrease in size of central left lower lobe pulmonary mass.  COMPLICATIONS OF TREATMENT: none  FOLLOW UP COMPLIANCE: keeps appointments   PHYSICAL EXAM:  BP 126/78   Pulse 90   Temp 98.1 F (36.7 C) (Tympanic)   Wt 186 lb (84.4 kg)   SpO2 97% Comment: room air  BMI 26.69 kg/m  Well-developed well-nourished patient in NAD. HEENT reveals PERLA, EOMI, discs not visualized.  Oral cavity is clear. No oral mucosal lesions are identified. Neck is clear without evidence of cervical or supraclavicular adenopathy. Lungs are clear to A&P. Cardiac examination is essentially unremarkable with regular rate and rhythm without murmur rub or thrill. Abdomen is benign with no organomegaly or masses noted. Motor sensory and DTR levels are equal and symmetric in the upper and lower extremities. Cranial nerves II through XII are grossly intact. Proprioception is intact. No peripheral adenopathy or edema is identified. No motor or sensory levels are noted. Crude visual fields are within normal range.  RADIOLOGY RESULTS: CT scan reviewed  PLAN: Present time patient is doing well he is  currently onmaintenance durvalumab which she is tolerating well.  I have asked to see him back in 3 to 4 months for follow-up.  Anticipate another CT scan by that time for my review.  Patient knows to call with any concerns.  He continues close follow-up care and treatment with medical oncology.  I would like to take this opportunity to thank you for allowing me to participate in the care of your patient.Noreene Filbert, MD

## 2020-01-27 ENCOUNTER — Inpatient Hospital Stay: Payer: Medicaid Other | Attending: Oncology

## 2020-01-27 DIAGNOSIS — Z5112 Encounter for antineoplastic immunotherapy: Secondary | ICD-10-CM | POA: Insufficient documentation

## 2020-01-27 DIAGNOSIS — E785 Hyperlipidemia, unspecified: Secondary | ICD-10-CM | POA: Insufficient documentation

## 2020-01-27 DIAGNOSIS — R0602 Shortness of breath: Secondary | ICD-10-CM | POA: Insufficient documentation

## 2020-01-27 DIAGNOSIS — C3432 Malignant neoplasm of lower lobe, left bronchus or lung: Secondary | ICD-10-CM | POA: Insufficient documentation

## 2020-01-27 DIAGNOSIS — Z87891 Personal history of nicotine dependence: Secondary | ICD-10-CM | POA: Insufficient documentation

## 2020-01-27 DIAGNOSIS — I10 Essential (primary) hypertension: Secondary | ICD-10-CM | POA: Insufficient documentation

## 2020-01-27 DIAGNOSIS — D6481 Anemia due to antineoplastic chemotherapy: Secondary | ICD-10-CM | POA: Insufficient documentation

## 2020-01-27 NOTE — Progress Notes (Signed)
Nutrition Follow-up:  Patient with adenocarcinoma of right lung.  Patient receiving durvalumab.    Spoke with patient via phone for nutrition follow-up.  Patient reports that appetite is about 50%. Reports eating ribs, chicken, burrito yesterday.  Ate eggs, grits and bolonga this am for breakfast.  Denies nausea.  Reports swallowing is better and pain is gone.      Medications: reviewed  Labs: reviewed  Anthropometrics:   Weight 12/3 186 lb increased from 185 lb on 11/8 189 lb on 10/4   NUTRITION DIAGNOSIS: Inadequate oral intake improving   INTERVENTION:  Encouraged patient to continue eating well balanced diet including good sources of protein.    MONITORING, EVALUATION, GOAL: weight trends, intake   NEXT VISIT: Jan 13 during infusion  Ginny Loomer B. Zenia Resides, [place], [place] Registered Dietitian [phone] (mobile)

## 2020-01-30 ENCOUNTER — Inpatient Hospital Stay: Payer: Medicaid Other

## 2020-01-30 ENCOUNTER — Other Ambulatory Visit: Payer: Self-pay

## 2020-01-30 ENCOUNTER — Inpatient Hospital Stay (HOSPITAL_BASED_OUTPATIENT_CLINIC_OR_DEPARTMENT_OTHER): Payer: Medicaid Other | Admitting: Oncology

## 2020-01-30 ENCOUNTER — Encounter: Payer: Self-pay | Admitting: Oncology

## 2020-01-30 VITALS — BP 134/88 | HR 80 | Temp 98.3°F | Resp 18 | Wt 188.0 lb

## 2020-01-30 DIAGNOSIS — C3432 Malignant neoplasm of lower lobe, left bronchus or lung: Secondary | ICD-10-CM

## 2020-01-30 DIAGNOSIS — R0602 Shortness of breath: Secondary | ICD-10-CM | POA: Diagnosis not present

## 2020-01-30 DIAGNOSIS — Z5112 Encounter for antineoplastic immunotherapy: Secondary | ICD-10-CM | POA: Diagnosis not present

## 2020-01-30 DIAGNOSIS — E785 Hyperlipidemia, unspecified: Secondary | ICD-10-CM | POA: Diagnosis not present

## 2020-01-30 DIAGNOSIS — D6481 Anemia due to antineoplastic chemotherapy: Secondary | ICD-10-CM | POA: Diagnosis not present

## 2020-01-30 DIAGNOSIS — Z87891 Personal history of nicotine dependence: Secondary | ICD-10-CM | POA: Diagnosis not present

## 2020-01-30 DIAGNOSIS — I10 Essential (primary) hypertension: Secondary | ICD-10-CM | POA: Diagnosis not present

## 2020-01-30 LAB — CBC WITH DIFFERENTIAL/PLATELET
Abs Immature Granulocytes: 0.03 10*3/uL (ref 0.00–0.07)
Basophils Absolute: 0 10*3/uL (ref 0.0–0.1)
Basophils Relative: 0 %
Eosinophils Absolute: 0.1 10*3/uL (ref 0.0–0.5)
Eosinophils Relative: 2 %
HCT: 35.6 % — ABNORMAL LOW (ref 39.0–52.0)
Hemoglobin: 11.1 g/dL — ABNORMAL LOW (ref 13.0–17.0)
Immature Granulocytes: 1 %
Lymphocytes Relative: 23 %
Lymphs Abs: 1.2 10*3/uL (ref 0.7–4.0)
MCH: 25.4 pg — ABNORMAL LOW (ref 26.0–34.0)
MCHC: 31.2 g/dL (ref 30.0–36.0)
MCV: 81.5 fL (ref 80.0–100.0)
Monocytes Absolute: 0.6 10*3/uL (ref 0.1–1.0)
Monocytes Relative: 12 %
Neutro Abs: 3.2 10*3/uL (ref 1.7–7.7)
Neutrophils Relative %: 62 %
Platelets: 326 10*3/uL (ref 150–400)
RBC: 4.37 MIL/uL (ref 4.22–5.81)
RDW: 19.1 % — ABNORMAL HIGH (ref 11.5–15.5)
WBC: 5.1 10*3/uL (ref 4.0–10.5)
nRBC: 0 % (ref 0.0–0.2)

## 2020-01-30 LAB — TSH: TSH: 0.65 u[IU]/mL (ref 0.350–4.500)

## 2020-01-30 LAB — COMPREHENSIVE METABOLIC PANEL
ALT: 8 U/L (ref 0–44)
AST: 16 U/L (ref 15–41)
Albumin: 3.5 g/dL (ref 3.5–5.0)
Alkaline Phosphatase: 55 U/L (ref 38–126)
Anion gap: 8 (ref 5–15)
BUN: 15 mg/dL (ref 8–23)
CO2: 25 mmol/L (ref 22–32)
Calcium: 9.1 mg/dL (ref 8.9–10.3)
Chloride: 105 mmol/L (ref 98–111)
Creatinine, Ser: 0.73 mg/dL (ref 0.61–1.24)
GFR, Estimated: >60 mL/min (ref >60)
Glucose, Bld: 108 mg/dL — ABNORMAL HIGH (ref 70–99)
Potassium: 3.6 mmol/L (ref 3.5–5.1)
Sodium: 138 mmol/L (ref 135–145)
Total Bilirubin: 0.5 mg/dL (ref 0.3–1.2)
Total Protein: 7.5 g/dL (ref 6.5–8.1)

## 2020-01-30 MED ORDER — HEPARIN SOD (PORK) LOCK FLUSH 100 UNIT/ML IV SOLN
500.0000 [IU] | Freq: Once | INTRAVENOUS | Status: AC | PRN
  Administered 2020-01-30: 500 [IU]
  Filled 2020-01-30: qty 5

## 2020-01-30 MED ORDER — SODIUM CHLORIDE 0.9 % IV SOLN
Freq: Once | INTRAVENOUS | Status: AC
  Filled 2020-01-30: qty 250

## 2020-01-30 MED ORDER — SODIUM CHLORIDE 0.9% FLUSH
10.0000 mL | INTRAVENOUS | Status: DC | PRN
  Administered 2020-01-30 (×2): 10 mL
  Filled 2020-01-30: qty 10

## 2020-01-30 MED ORDER — ALBUTEROL SULFATE HFA 108 (90 BASE) MCG/ACT IN AERS: 2.0000 | INHALATION_SPRAY | RESPIRATORY_TRACT | 2 refills | Status: DC | PRN

## 2020-01-30 MED ORDER — SODIUM CHLORIDE 0.9 % IV SOLN
10.0000 mg/kg | Freq: Once | INTRAVENOUS | Status: AC
  Administered 2020-01-30: 860 mg via INTRAVENOUS
  Filled 2020-01-30: qty 10

## 2020-01-30 MED ORDER — HEPARIN SOD (PORK) LOCK FLUSH 100 UNIT/ML IV SOLN
INTRAVENOUS | Status: AC
  Filled 2020-01-30: qty 5

## 2020-01-30 NOTE — Progress Notes (Signed)
Tolerated durvalumab well. Discharged home in stable condition.

## 2020-01-31 LAB — T4: T4, Total: 7.8 ug/dL (ref 4.5–12.0)

## 2020-01-31 NOTE — Progress Notes (Signed)
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 He underwent CT angios chest which did not show any PE. Soft tissue attenuation measuring 2.9 x 4 cm centered in the left hilum resulting in abrupt angulation and narrowing of the pulmonary arteries and the central left lower lobe airways. Additional ipsilateral hilar and subcarinal adenopathy. No contralateral adenopathy. Consolidative masslike opacity 3.6 x 3.1 cm in size and contiguous with more central perihilar soft tissue attenuation. Overall findings concerning for primary bronchogenic carcinoma.  Patient lives with his sister who is his main caregiver. He does not drive but is independent of his ADLs. Reports ongoing fatigue and occasional retrosternal chest pain. He has been evaluated by GI in the past for reflux as well. Appetieis fair and weight is stable.Denies any significant shortness of breath at this time.Patient is an ex-smoker and smoked for about 20 years but quit smoking back in 1994  PET CT scan showed hypermetabolic mass in the left lower lobe with mediastinal adenopathy and right paratracheal adenopathy. No evidence of distant metastatic disease. Pathology was consistent with non-small cell lung cancer favor adenocarcinoma. Cells were positive for TTF-1 and negative  for p40.  Patient started concurrent carbotaxol radiation and then had allergic reaction to Taxol for which she was switched to Abraxane. Given the short supply of Abraxane patient received carbo Alimta midway through his concurrent chemoradiation.  Scans post chemoradiation showed partial response and patient will be started on maintenance durvalumab  Interval history-patient reports doing well and denies any pain during swallowing.  He has not had any further syncopal episodes.  Weight has remained stable over the last 1 month.  ECOG PS- 1 Pain scale- 0   Review of systems- Review of Systems  Constitutional: Negative for chills, fever, malaise/fatigue and weight loss.  HENT: Negative for congestion, ear discharge and nosebleeds.   Eyes: Negative for blurred vision.  Respiratory: Negative for cough, hemoptysis, sputum production, shortness of breath and wheezing.   Cardiovascular: Negative for chest pain, palpitations, orthopnea and claudication.  Gastrointestinal: Negative for abdominal pain, blood in stool, constipation, diarrhea, heartburn, melena, nausea and vomiting.  Genitourinary: Negative for dysuria, flank pain, frequency, hematuria and urgency.  Musculoskeletal: Negative for back pain, joint pain and myalgias.  Skin: Negative for rash.  Neurological: Negative for dizziness, tingling, focal weakness, seizures, weakness and headaches.  Endo/Heme/Allergies: Does not bruise/bleed easily.  Psychiatric/Behavioral: Negative for depression and suicidal ideas. The patient does not have insomnia.        Allergies  Allergen Reactions  . Taxol [Paclitaxel] Other (See Comments)    Chest pain, hip pain, coughing , wheezing     Past Medical History:  Diagnosis Date  . Cancer ([place])   . Dyspnea   . Hyperlipidemia   . Hypertension      Past Surgical History:  Procedure Laterality Date  . PORTA CATH INSERTION N/A 10/22/2019   Procedure: PORTA CATH INSERTION;  Surgeon: Katha Cabal, MD;  Location: [facility];  Service: Cardiovascular;  Laterality: N/A;  . VIDEO BRONCHOSCOPY WITH ENDOBRONCHIAL ULTRASOUND N/A 09/25/2019   Procedure: VIDEO BRONCHOSCOPY WITH ENDOBRONCHIAL ULTRASOUND;  Surgeon: Tyler Pita, MD;  Location: [facility] OR[facility];  Service: Pulmonary;  Laterality: N/A;    Social History   Socioeconomic History  . Marital status: Single    Spouse name: Not on file  . Number of children: Not on file  . Years of education: Not on file  . Highest education level: Not on file  Occupational History  . Not on file  Tobacco Use  . Smoking status: Former Smoker    Packs/day: 1.00    Years: 20.00    Pack years: 20.00    Types: Cigarettes    Quit date: 1994    Years since quitting: 27.9  . Smokeless tobacco: Never Used  Vaping Use  . Vaping Use: Never used  Substance and Sexual Activity  . Alcohol use: Yes    Comment: occ  . Drug use: Never  . Sexual activity: Not on file  Other Topics Concern  . Not on file  Social History Narrative  . Not on file   Social Determinants of Health   Financial Resource Strain: Not on file  Food Insecurity: Not on file  Transportation Needs: Not on file  Physical Activity: Not on file  Stress: Not on file  Social Connections: Not on file  Intimate Partner Violence: Not on file    Family History  Problem Relation Age of Onset  . Cancer Brother      Current Outpatient Medications:  .  acetaminophen (TYLENOL) 500 MG tablet, Take 1,000 mg by mouth every 6 (six) hours as needed for mild pain, moderate pain or headache., Disp: , Rfl:  .  ANORO ELLIPTA 62.5-25 MCG/INH AEPB, Inhale 1 puff into the lungs daily., Disp: 1 each, Rfl: 2 .  benzonatate (TESSALON PERLES) 100 MG capsule, Take 1 capsule (100 mg total) by mouth every 6 (six) hours as needed for cough., Disp: 90 capsule, Rfl: 0 .  famotidine (PEPCID) 20 MG tablet, TAKE 1 TABLET(20 MG) BY MOUTH TWICE DAILY, Disp: 60 tablet, Rfl: 1 .  fentaNYL  (DURAGESIC) 12 MCG/HR, Place 1 patch onto the skin every 3 (three) days., Disp: 10 patch, Rfl: 0 .  folic acid (FOLVITE) 1 MG tablet, Take 1 tablet (1 mg total) by mouth daily., Disp: 30 tablet, Rfl: 3 .  gabapentin (NEURONTIN) 100 MG capsule, TAKE 1 CAPSULE(100 MG) BY MOUTH THREE TIMES DAILY, Disp: 90 capsule, Rfl: 0 .  lidocaine (XYLOCAINE) 2 % solution, Use as directed 2 mLs in the mouth or throat., Disp: , Rfl:  .  lidocaine-prilocaine (EMLA) cream, Apply 1 application topically once., Disp: , Rfl:  .  oxyCODONE (OXY IR/ROXICODONE) 5 MG immediate release tablet, Take 2 tablets (10 mg total) by mouth every 4 (four) hours as needed for severe pain., Disp: 90 tablet, Rfl: 0 .  pantoprazole (PROTONIX) 40 MG tablet, Take 1 tablet (40 mg total) by mouth daily., Disp: 30 tablet, Rfl: 3 .  potassium chloride (KLOR-CON) 20 MEQ packet, Take 20 mEq by mouth daily., Disp: 10 packet, Rfl: 0 .  sucralfate (CARAFATE) 1 g tablet, Take 1 tablet (1 g total) by mouth 3 (three) times daily. Dissolve in 3-4 tbsp warm water, swish and swallow., Disp: 90 tablet, Rfl: 1 .  traZODone (DESYREL) 50 MG tablet, TAKE 1 TABLET(50 MG) BY MOUTH AT BEDTIME, Disp:  30 tablet, Rfl: 1 .  albuterol (VENTOLIN HFA) 108 (90 Base) MCG/ACT inhaler, Inhale 2 puffs into the lungs every 4 (four) hours as needed for wheezing or shortness of breath., Disp: 1 each, Rfl: 2  Physical exam:  Vitals:   01/30/20 1114  BP: 134/88  Pulse: 80  Resp: 18  Temp: 98.3 F (36.8 C)  TempSrc: Tympanic  SpO2: 97%  Weight: 188 lb (85.3 kg)   Physical Exam Constitutional:      General: He is not in acute distress. Eyes:     Extraocular Movements: EOM normal.  Cardiovascular:     Rate and Rhythm: Normal rate and regular rhythm.     Heart sounds: Normal heart sounds.  Pulmonary:     Effort: Pulmonary effort is normal.     Breath sounds: Normal breath sounds.  Abdominal:     General: Bowel sounds are normal.     Palpations: Abdomen is soft.   Skin:    General: Skin is warm and dry.  Neurological:     Mental Status: He is alert and oriented to person, place, and time.      CMP Latest Ref Rng & Units 01/30/2020  Glucose 70 - 99 mg/dL 108(H)  BUN 8 - 23 mg/dL 15  Creatinine 0.61 - 1.24 mg/dL 0.73  Sodium 135 - 145 mmol/L 138  Potassium 3.5 - 5.1 mmol/L 3.6  Chloride 98 - 111 mmol/L 105  CO2 22 - 32 mmol/L 25  Calcium 8.9 - 10.3 mg/dL 9.1  Total Protein 6.5 - 8.1 g/dL 7.5  Total Bilirubin 0.3 - 1.2 mg/dL 0.5  Alkaline Phos 38 - 126 U/L 55  AST 15 - 41 U/L 16  ALT 0 - 44 U/L 8   CBC Latest Ref Rng & Units 01/30/2020  WBC 4.0 - 10.5 K/uL 5.1  Hemoglobin 13.0 - 17.0 g/dL 11.1(L)  Hematocrit 39.0 - 52.0 % 35.6(L)  Platelets 150 - 400 K/uL 326    No images are attached to the encounter.  CT Head W Wo Contrast  Result Date: 01/13/2020 CLINICAL DATA:  Lung cancer restaging after chemo. EXAM: CT HEAD WITHOUT AND WITH CONTRAST TECHNIQUE: Contiguous axial images were obtained from the base of the skull through the vertex without and with intravenous contrast CONTRAST:  81mL OMNIPAQUE IOHEXOL 300 MG/ML  SOLN COMPARISON:  CT head 10/10/2019 FINDINGS: Brain: Ventricle size and cerebral volume normal. Moderate white matter changes with patchy white matter hyperintensity bilaterally, stable. Negative for acute infarct, hemorrhage, mass. No enhancing metastatic deposits identified. Vascular: Negative for hyperdense vessel. Normal arterial and venous enhancement. Skull: Negative Sinuses/Orbits: Paranasal sinuses clear.  Normal orbit bilaterally. Other: None IMPRESSION: Stable CT head.  Negative for metastatic disease. Electronically Signed   By: Franchot Gallo M.D.   On: 01/13/2020 15:35   ECHOCARDIOGRAM COMPLETE  Result Date: 01/13/2020    ECHOCARDIOGRAM REPORT   Patient Name:   SEENA FACE Date of Exam: 01/13/2020 Medical Rec #:  [id number]     Height:       70.0 in Accession #:    [id number]    Weight:       185.0 lb Date of  Birth:  1950-12-05      BSA:          2.019 m Patient Age:    89 years      BP:           127/77 mmHg Patient Gender: M  HR:           71 bpm. Exam Location:  [facility] Procedure: 2D Echo, Color Doppler, Cardiac Doppler and Strain Analysis Indications:     Z09 Chemo  History:         Patient has no prior history of Echocardiogram examinations.                  Risk Factors:Hypertension and Dyslipidemia.  Sonographer:     Charmayne Sheer RDCS (AE) Referring Phys:  [id number] Weston Anna Shelbey Spindler Diagnosing Phys: Kathlyn Sacramento MD  Sonographer Comments: Global longitudinal strain was attempted. IMPRESSIONS  1. Left ventricular ejection fraction, by estimation, is 55 to 60%. The left ventricle has normal function. The left ventricle has no regional wall motion abnormalities. Left ventricular diastolic parameters were normal. The average left ventricular global longitudinal strain is 14.4 %. The global longitudinal strain is abnormal.  2. Right ventricular systolic function is normal. The right ventricular size is normal. Tricuspid regurgitation signal is inadequate for assessing PA pressure.  3. Left atrial size was mildly dilated.  4. The mitral valve is normal in structure. No evidence of mitral valve regurgitation. No evidence of mitral stenosis.  5. The aortic valve is normal in structure. Aortic valve regurgitation is mild. Mild aortic valve sclerosis is present, with no evidence of aortic valve stenosis.  6. The inferior vena cava is normal in size with greater than 50% respiratory variability, suggesting right atrial pressure of 3 mmHg. FINDINGS  Left Ventricle: Left ventricular ejection fraction, by estimation, is 55 to 60%. The left ventricle has normal function. The left ventricle has no regional wall motion abnormalities. The average left ventricular global longitudinal strain is 14.4 %. The  global longitudinal strain is abnormal. The left ventricular internal cavity size was normal in size. There is borderline left  ventricular hypertrophy. Left ventricular diastolic parameters were normal. Right Ventricle: The right ventricular size is normal. No increase in right ventricular wall thickness. Right ventricular systolic function is normal. Tricuspid regurgitation signal is inadequate for assessing PA pressure. Left Atrium: Left atrial size was mildly dilated. Right Atrium: Right atrial size was normal in size. Pericardium: There is no evidence of pericardial effusion. Mitral Valve: The mitral valve is normal in structure. No evidence of mitral valve regurgitation. No evidence of mitral valve stenosis. MV peak gradient, 5.5 mmHg. The mean mitral valve gradient is 2.0 mmHg. Tricuspid Valve: The tricuspid valve is normal in structure. Tricuspid valve regurgitation is trivial. No evidence of tricuspid stenosis. Aortic Valve: The aortic valve is normal in structure. Aortic valve regurgitation is mild. Mild aortic valve sclerosis is present, with no evidence of aortic valve stenosis. Aortic valve mean gradient measures 5.0 mmHg. Aortic valve peak gradient measures 9.4 mmHg. Aortic valve area, by VTI measures 2.40 cm. Pulmonic Valve: The pulmonic valve was normal in structure. Pulmonic valve regurgitation is not visualized. No evidence of pulmonic stenosis. Aorta: The aortic root is normal in size and structure. Venous: The inferior vena cava is normal in size with greater than 50% respiratory variability, suggesting right atrial pressure of 3 mmHg. IAS/Shunts: No atrial level shunt detected by color flow Doppler.  LEFT VENTRICLE PLAX 2D LVIDd:         4.40 cm  Diastology LVIDs:         2.95 cm  LV e' medial:    7.29 cm/s LV PW:         1.17 cm  LV E/e' medial:  12.6 LV IVS:  0.96 cm  LV e' lateral:   5.11 cm/s LVOT diam:     2.20 cm  LV E/e' lateral: 18.0 LV SV:         77 LV SV Index:   38       2D Longitudinal Strain LVOT Area:     3.80 cm 2D Strain GLS Avg:     14.4 %                          3D Volume EF:                          3D EF:        56 %                         LV EDV:       225 ml                         LV ESV:       100 ml                         LV SV:        125 ml RIGHT VENTRICLE RV Basal diam:  3.22 cm LEFT ATRIUM             Index       RIGHT ATRIUM           Index LA diam:        4.00 cm 1.98 cm/m  RA Area:     13.60 cm LA Vol (A2C):   37.8 ml 18.72 ml/m RA Volume:   32.60 ml  16.14 ml/m LA Vol (A4C):   55.7 ml 27.58 ml/m LA Biplane Vol: 47.1 ml 23.33 ml/m  AORTIC VALVE                    PULMONIC VALVE AV Area (Vmax):    2.86 cm     PV Vmax:       1.26 m/s AV Area (Vmean):   2.51 cm     PV Vmean:      86.000 cm/s AV Area (VTI):     2.40 cm     PV VTI:        0.217 m AV Vmax:           153.00 cm/s  PV Peak grad:  6.4 mmHg AV Vmean:          107.000 cm/s PV Mean grad:  3.0 mmHg AV VTI:            0.320 m AV Peak Grad:      9.4 mmHg AV Mean Grad:      5.0 mmHg LVOT Vmax:         115.00 cm/s LVOT Vmean:        70.600 cm/s LVOT VTI:          0.202 m LVOT/AV VTI ratio: 0.63  AORTA Ao Root diam: 4.20 cm MITRAL VALVE MV Area (PHT): 2.92 cm     SHUNTS MV Peak grad:  5.5 mmHg     Systemic VTI:  0.20 m MV Mean grad:  2.0 mmHg     Systemic Diam: 2.20 cm MV Vmax:       1.17 m/s MV Vmean:  67.5 cm/s MV Decel Time: 260 msec MV E velocity: 92.00 cm/s MV A velocity: 118.00 cm/s MV E/A ratio:  0.78 Kathlyn Sacramento MD Electronically signed by Kathlyn Sacramento MD Signature Date/Time: 01/13/2020/12:16:16 PM    Final      Assessment and plan- Patient is a 69 y.o. 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 He is here for on treatment assessment prior to cycle 2 of maintenance durvalumab  Counts okay to proceed with cycle 2 of maintenance durvalumab today. I will see him back in 2 weeks for cycle 3. So far he is tolerating treatment well without any significant side effects.  Chemo-induced anemia: Improving after stopping chemotherapy. Continue to monitor   Visit Diagnosis 1. Malignant neoplasm  of lower lobe of left lung ([place])   2. Encounter for antineoplastic immunotherapy      Dr. Randa Evens, MD, MPH [facility] [phone] 01/31/2020 8:52 AM

## 2020-02-13 ENCOUNTER — Inpatient Hospital Stay (HOSPITAL_BASED_OUTPATIENT_CLINIC_OR_DEPARTMENT_OTHER): Payer: Medicaid Other | Admitting: Oncology

## 2020-02-13 ENCOUNTER — Encounter: Payer: Self-pay | Admitting: Oncology

## 2020-02-13 ENCOUNTER — Inpatient Hospital Stay: Payer: Medicaid Other

## 2020-02-13 VITALS — BP 136/79 | HR 78 | Temp 98.1°F | Resp 18 | Wt 188.5 lb

## 2020-02-13 DIAGNOSIS — Z5112 Encounter for antineoplastic immunotherapy: Secondary | ICD-10-CM

## 2020-02-13 DIAGNOSIS — T451X5A Adverse effect of antineoplastic and immunosuppressive drugs, initial encounter: Secondary | ICD-10-CM

## 2020-02-13 DIAGNOSIS — C3432 Malignant neoplasm of lower lobe, left bronchus or lung: Secondary | ICD-10-CM

## 2020-02-13 DIAGNOSIS — D6481 Anemia due to antineoplastic chemotherapy: Secondary | ICD-10-CM

## 2020-02-13 DIAGNOSIS — L299 Pruritus, unspecified: Secondary | ICD-10-CM

## 2020-02-13 DIAGNOSIS — E538 Deficiency of other specified B group vitamins: Secondary | ICD-10-CM

## 2020-02-13 LAB — COMPREHENSIVE METABOLIC PANEL
ALT: 7 U/L (ref 0–44)
AST: 23 U/L (ref 15–41)
Albumin: 3.5 g/dL (ref 3.5–5.0)
Alkaline Phosphatase: 57 U/L (ref 38–126)
Anion gap: 7 (ref 5–15)
BUN: 15 mg/dL (ref 8–23)
CO2: 26 mmol/L (ref 22–32)
Calcium: 9 mg/dL (ref 8.9–10.3)
Chloride: 107 mmol/L (ref 98–111)
Creatinine, Ser: 0.83 mg/dL (ref 0.61–1.24)
GFR, Estimated: >60 mL/min (ref >60)
Glucose, Bld: 111 mg/dL — ABNORMAL HIGH (ref 70–99)
Potassium: 3.5 mmol/L (ref 3.5–5.1)
Sodium: 140 mmol/L (ref 135–145)
Total Bilirubin: 0.9 mg/dL (ref 0.3–1.2)
Total Protein: 7.1 g/dL (ref 6.5–8.1)

## 2020-02-13 LAB — CBC WITH DIFFERENTIAL/PLATELET
Abs Immature Granulocytes: 0.02 10*3/uL (ref 0.00–0.07)
Basophils Absolute: 0 10*3/uL (ref 0.0–0.1)
Basophils Relative: 0 %
Eosinophils Absolute: 0.1 10*3/uL (ref 0.0–0.5)
Eosinophils Relative: 2 %
HCT: 36 % — ABNORMAL LOW (ref 39.0–52.0)
Hemoglobin: 11.3 g/dL — ABNORMAL LOW (ref 13.0–17.0)
Immature Granulocytes: 0 %
Lymphocytes Relative: 19 %
Lymphs Abs: 1.1 10*3/uL (ref 0.7–4.0)
MCH: 25.7 pg — ABNORMAL LOW (ref 26.0–34.0)
MCHC: 31.4 g/dL (ref 30.0–36.0)
MCV: 82 fL (ref 80.0–100.0)
Monocytes Absolute: 0.7 10*3/uL (ref 0.1–1.0)
Monocytes Relative: 11 %
Neutro Abs: 4.1 10*3/uL (ref 1.7–7.7)
Neutrophils Relative %: 68 %
Platelets: 238 10*3/uL (ref 150–400)
RBC: 4.39 MIL/uL (ref 4.22–5.81)
RDW: 18.3 % — ABNORMAL HIGH (ref 11.5–15.5)
WBC: 6 10*3/uL (ref 4.0–10.5)
nRBC: 0 % (ref 0.0–0.2)

## 2020-02-13 MED ORDER — CYANOCOBALAMIN 1000 MCG/ML IJ SOLN
1000.0000 ug | INTRAMUSCULAR | Status: DC
  Administered 2020-02-13: 1000 ug via INTRAMUSCULAR
  Filled 2020-02-13: qty 1

## 2020-02-13 MED ORDER — HEPARIN SOD (PORK) LOCK FLUSH 100 UNIT/ML IV SOLN
500.0000 [IU] | Freq: Once | INTRAVENOUS | Status: AC
  Administered 2020-02-13: 500 [IU] via INTRAVENOUS
  Filled 2020-02-13: qty 5

## 2020-02-13 MED ORDER — SODIUM CHLORIDE 0.9% FLUSH
10.0000 mL | INTRAVENOUS | Status: DC | PRN
  Administered 2020-02-13: 10 mL via INTRAVENOUS
  Filled 2020-02-13: qty 10

## 2020-02-13 MED ORDER — HEPARIN SOD (PORK) LOCK FLUSH 100 UNIT/ML IV SOLN
INTRAVENOUS | Status: AC
  Filled 2020-02-13: qty 5

## 2020-02-13 MED ORDER — SODIUM CHLORIDE 0.9 % IV SOLN
Freq: Once | INTRAVENOUS | Status: AC
  Filled 2020-02-13: qty 250

## 2020-02-13 MED ORDER — SODIUM CHLORIDE 0.9 % IV SOLN
10.0000 mg/kg | Freq: Once | INTRAVENOUS | Status: AC
  Administered 2020-02-13: 860 mg via INTRAVENOUS
  Filled 2020-02-13: qty 10

## 2020-02-13 NOTE — Progress Notes (Signed)
1245- Per MD, Dr. Janese Banks, secure chat message: patient to receive Vitamin B-12 1000 mcg IM injection today; order is present in supportive therapy plan.  1415- Patient tolerated treatment well. Patient stable and discharged to home at this time.

## 2020-02-13 NOTE — Progress Notes (Signed)
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 He underwent CT angios chest which did not show any PE. Soft tissue attenuation measuring 2.9 x 4 cm centered in the left hilum resulting in abrupt angulation and narrowing of the pulmonary arteries and the central left lower lobe airways. Additional ipsilateral hilar and subcarinal adenopathy. No contralateral adenopathy. Consolidative masslike opacity 3.6 x 3.1 cm in size and contiguous with more central perihilar soft tissue attenuation. Overall findings concerning for primary bronchogenic carcinoma.  Patient lives with his sister who is his main caregiver. He does not drive but is independent of his ADLs. Reports ongoing fatigue and occasional retrosternal chest pain. He has been evaluated by GI in the past for reflux as well. Appetieis fair and weight is stable.Denies any significant shortness of breath at this time.Patient is an ex-smoker and smoked for about 20 years but quit smoking back in 1994  PET CT scan showed hypermetabolic mass in the left lower lobe with mediastinal adenopathy and right paratracheal adenopathy. No evidence of distant metastatic disease. Pathology was consistent with non-small cell lung cancer favor adenocarcinoma. Cells were positive for TTF-1 and  negative for p40.  Patient started concurrent carbotaxol radiation and then had allergic reaction to Taxol for which she was switched to Abraxane. Given the short supply of Abraxane patient received carbo Alimta midway through his concurrent chemoradiation.Scans post chemoradiation showed partial response and patient will be started on maintenance durvalumab on 01/14/2020  Interval history- doing well overall. But he is still complaining of generalized itching without a distinct rash No further episodes of syncope  ECOG PS- 1 Pain scale- 0 Opioid associated constipation- no  Review of systems- Review of Systems  Constitutional: Positive for malaise/fatigue. Negative for chills, fever and weight loss.  HENT: Negative for congestion, ear discharge and nosebleeds.   Eyes: Negative for blurred vision.  Respiratory: Negative for cough, hemoptysis, sputum production, shortness of breath and wheezing.   Cardiovascular: Negative for chest pain, palpitations, orthopnea and claudication.  Gastrointestinal: Negative for abdominal pain, blood in stool, constipation, diarrhea, heartburn, melena, nausea and vomiting.  Genitourinary: Negative for dysuria, flank pain, frequency, hematuria and urgency.  Musculoskeletal: Negative for back pain, joint pain and myalgias.  Skin: Positive for itching. Negative for rash.  Neurological: Negative for dizziness, tingling, focal weakness, seizures, weakness and headaches.  Endo/Heme/Allergies: Does not bruise/bleed easily.  Psychiatric/Behavioral: Negative for depression and suicidal ideas. The patient does not have insomnia.       Allergies  Allergen Reactions  . Taxol [Paclitaxel] Other (See Comments)    Chest pain, hip pain, coughing , wheezing     Past Medical History:  Diagnosis Date  . Cancer ([place])   . Dyspnea   . Hyperlipidemia   . Hypertension      Past Surgical History:  Procedure Laterality Date  . PORTA CATH INSERTION N/A 10/22/2019    Procedure: PORTA CATH  INSERTION;  Surgeon: Katha Cabal, MD;  Location: [place] [facility];  Service: Cardiovascular;  Laterality: N/A;  . VIDEO BRONCHOSCOPY WITH ENDOBRONCHIAL ULTRASOUND N/A 09/25/2019   Procedure: VIDEO BRONCHOSCOPY WITH ENDOBRONCHIAL ULTRASOUND;  Surgeon: Tyler Pita, MD;  Location: [facility] ORS;  Service: Pulmonary;  Laterality: N/A;    Social History   Socioeconomic History  . Marital status: Single    Spouse name: Not on file  . Number of children: Not on file  . Years of education: Not on file  . Highest education level: Not on file  Occupational History  . Not on file  Tobacco Use  . Smoking status: Former Smoker    Packs/day: 1.00    Years: 20.00    Pack years: 20.00    Types: Cigarettes    Quit date: 1994    Years since quitting: 28.0  . Smokeless tobacco: Never Used  Vaping Use  . Vaping Use: Never used  Substance and Sexual Activity  . Alcohol use: Yes    Comment: occ  . Drug use: Never  . Sexual activity: Not on file  Other Topics Concern  . Not on file  Social History Narrative  . Not on file   Social Determinants of Health   Financial Resource Strain: Not on file  Food Insecurity: Not on file  Transportation Needs: Not on file  Physical Activity: Not on file  Stress: Not on file  Social Connections: Not on file  Intimate Partner Violence: Not on file    Family History  Problem Relation Age of Onset  . Cancer Brother      Current Outpatient Medications:  .  acetaminophen (TYLENOL) 500 MG tablet, Take 1,000 mg by mouth every 6 (six) hours as needed for mild pain, moderate pain or headache., Disp: , Rfl:  .  albuterol (VENTOLIN HFA) 108 (90 Base) MCG/ACT inhaler, Inhale 2 puffs into the lungs every 4 (four) hours as needed for wheezing or shortness of breath., Disp: 1 each, Rfl: 2 .  ANORO ELLIPTA 62.5-25 MCG/INH AEPB, Inhale 1 puff into the lungs daily., Disp: 1 each, Rfl: 2 .  benzonatate (TESSALON PERLES) 100 MG  capsule, Take 1 capsule (100 mg total) by mouth every 6 (six) hours as needed for cough., Disp: 90 capsule, Rfl: 0 .  famotidine (PEPCID) 20 MG tablet, TAKE 1 TABLET(20 MG) BY MOUTH TWICE DAILY, Disp: 60 tablet, Rfl: 1 .  fentaNYL (DURAGESIC) 12 MCG/HR, Place 1 patch onto the skin every 3 (three) days., Disp: 10 patch, Rfl: 0 .  folic acid (FOLVITE) 1 MG tablet, Take 1 tablet (1 mg total) by mouth daily., Disp: 30 tablet, Rfl: 3 .  gabapentin (NEURONTIN) 100 MG capsule, TAKE 1 CAPSULE(100 MG) BY MOUTH THREE TIMES DAILY, Disp: 90 capsule, Rfl: 0 .  lidocaine (XYLOCAINE) 2 % solution, Use as directed 2 mLs in the mouth or throat., Disp: , Rfl:  .  lidocaine-prilocaine (EMLA) cream, Apply 1 application topically once., Disp: , Rfl:  .  oxyCODONE (OXY IR/ROXICODONE) 5 MG immediate release tablet, Take 2 tablets (10 mg total) by mouth every 4 (four) hours as needed for severe pain., Disp: 90 tablet, Rfl: 0 .  pantoprazole (PROTONIX) 40 MG tablet, Take 1 tablet (40 mg total) by mouth daily., Disp: 30 tablet, Rfl: 3 .  potassium chloride (KLOR-CON) 20 MEQ packet, Take 20 mEq by mouth daily., Disp: 10 packet, Rfl: 0 .  sucralfate (CARAFATE) 1 g tablet, Take 1 tablet (1 g total) by mouth  3 (three) times daily. Dissolve in 3-4 tbsp warm water, swish and swallow., Disp: 90 tablet, Rfl: 1 .  traZODone (DESYREL) 50 MG tablet, TAKE 1 TABLET(50 MG) BY MOUTH AT BEDTIME, Disp: 30 tablet, Rfl: 1  Physical exam:  Vitals:   02/13/20 1116  BP: 136/79  Pulse: 78  Resp: 18  Temp: 98.1 F (36.7 C)  TempSrc: Tympanic  SpO2: 100%  Weight: 188 lb 8 oz (85.5 kg)   Physical Exam Eyes:     Extraocular Movements: EOM normal.     Pupils: Pupils are equal, round, and reactive to light.  Cardiovascular:     Rate and Rhythm: Normal rate and regular rhythm.     Heart sounds: Normal heart sounds.  Pulmonary:     Effort: Pulmonary effort is normal.     Breath sounds: Normal breath sounds.  Abdominal:     General:  Bowel sounds are normal.     Palpations: Abdomen is soft.  Skin:    General: Skin is warm and dry.     Comments: Nail marks from constant itching noted. No distinct rash  Neurological:     Mental Status: He is alert and oriented to person, place, and time.      CMP Latest Ref Rng & Units 01/30/2020  Glucose 70 - 99 mg/dL 108(H)  BUN 8 - 23 mg/dL 15  Creatinine 0.61 - 1.24 mg/dL 0.73  Sodium 135 - 145 mmol/L 138  Potassium 3.5 - 5.1 mmol/L 3.6  Chloride 98 - 111 mmol/L 105  CO2 22 - 32 mmol/L 25  Calcium 8.9 - 10.3 mg/dL 9.1  Total Protein 6.5 - 8.1 g/dL 7.5  Total Bilirubin 0.3 - 1.2 mg/dL 0.5  Alkaline Phos 38 - 126 U/L 55  AST 15 - 41 U/L 16  ALT 0 - 44 U/L 8   CBC Latest Ref Rng & Units 01/30/2020  WBC 4.0 - 10.5 K/uL 5.1  Hemoglobin 13.0 - 17.0 g/dL 11.1(L)  Hematocrit 39.0 - 52.0 % 35.6(L)  Platelets 150 - 400 K/uL 326      Assessment and plan- Patient is a 69 y.o. 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   Is here for on treatment assessment prior to cycle 3 of maintenance durvalumab  Counts okay to proceed with cycle 3 of maintenance durvalumab today.  I will see him back in 2 weeks for cycle 4.  Plan to repeat scans after 6 cycles  Chemo-induced anemia:stable. Continue to monitor  Pruritus: cause unclear. No new soaps/lotions. No distinct rash. I have asked him to use prn benadryl   Visit Diagnosis 1. Malignant neoplasm of lower lobe of left lung ([place])   2. Encounter for antineoplastic immunotherapy   3. Antineoplastic chemotherapy induced anemia   4. Pruritus      Dr. Randa Evens, MD, MPH [facility] at [facility] [phone] 02/16/2020 9:49 PM

## 2020-02-26 ENCOUNTER — Other Ambulatory Visit: Payer: Self-pay | Admitting: Radiation Oncology

## 2020-02-27 ENCOUNTER — Other Ambulatory Visit: Payer: Medicaid Other

## 2020-02-27 ENCOUNTER — Ambulatory Visit: Payer: Medicaid Other

## 2020-02-27 ENCOUNTER — Telehealth: Payer: Self-pay

## 2020-02-27 ENCOUNTER — Ambulatory Visit: Payer: Medicaid Other | Admitting: Oncology

## 2020-02-27 NOTE — Telephone Encounter (Signed)
Nutrition Follow-up:  Patient with adenocarcinoma of right lung.  Patient receiving durvalumab.  Spoke with patient via phone for nutrition follow-up.  Patient reports that he does not eat as much as he use too.  Reports that he is eating half of normal intake.    Reports that the day after treatment was real stiff and could hardly move fingers. This last about 3 days and it has resolved.      Medications: reviewed  Labs: reviewed  Anthropometrics:   Weight stable at 188 lb  186 lb on 12/3 185 lb on 11/8 189 lb on 10/4   NUTRITION DIAGNOSIS: Inadequate oral intake stable with stable weight   INTERVENTION:  Encouraged patient to continue eating high calorie, high protein to maintain weight Will send message to Dr Janese Banks and team regarding patient's stiffness. Encouraged patient to notify MD with any new changes after treatment.  Patient to call RD if weight decreases   NEXT VISIT: no follow-up planned RD available if weight decreases or changes occur  Tenita Cue B. Zenia Resides, [place], [place] Registered Dietitian [phone] (mobile)

## 2020-03-02 ENCOUNTER — Inpatient Hospital Stay: Payer: Medicaid Other | Admitting: Oncology

## 2020-03-02 ENCOUNTER — Inpatient Hospital Stay: Payer: Medicaid Other

## 2020-03-06 ENCOUNTER — Other Ambulatory Visit: Payer: Medicaid Other

## 2020-03-06 ENCOUNTER — Ambulatory Visit: Payer: Medicaid Other

## 2020-03-06 ENCOUNTER — Ambulatory Visit: Payer: Medicaid Other | Admitting: Oncology

## 2020-03-12 ENCOUNTER — Encounter: Payer: Self-pay | Admitting: Oncology

## 2020-03-12 ENCOUNTER — Other Ambulatory Visit: Payer: Self-pay

## 2020-03-12 ENCOUNTER — Inpatient Hospital Stay (HOSPITAL_BASED_OUTPATIENT_CLINIC_OR_DEPARTMENT_OTHER): Payer: Medicaid Other | Admitting: Oncology

## 2020-03-12 ENCOUNTER — Inpatient Hospital Stay: Payer: Medicaid Other | Attending: Oncology

## 2020-03-12 ENCOUNTER — Inpatient Hospital Stay: Payer: Medicaid Other

## 2020-03-12 VITALS — BP 139/83 | HR 75 | Temp 97.9°F | Resp 18 | Wt 190.0 lb

## 2020-03-12 DIAGNOSIS — Z79899 Other long term (current) drug therapy: Secondary | ICD-10-CM | POA: Diagnosis not present

## 2020-03-12 DIAGNOSIS — I10 Essential (primary) hypertension: Secondary | ICD-10-CM | POA: Insufficient documentation

## 2020-03-12 DIAGNOSIS — T451X5A Adverse effect of antineoplastic and immunosuppressive drugs, initial encounter: Secondary | ICD-10-CM | POA: Diagnosis not present

## 2020-03-12 DIAGNOSIS — C3432 Malignant neoplasm of lower lobe, left bronchus or lung: Secondary | ICD-10-CM

## 2020-03-12 DIAGNOSIS — C3492 Malignant neoplasm of unspecified part of left bronchus or lung: Secondary | ICD-10-CM | POA: Insufficient documentation

## 2020-03-12 DIAGNOSIS — Z87891 Personal history of nicotine dependence: Secondary | ICD-10-CM | POA: Diagnosis not present

## 2020-03-12 DIAGNOSIS — R0602 Shortness of breath: Secondary | ICD-10-CM | POA: Insufficient documentation

## 2020-03-12 DIAGNOSIS — D6481 Anemia due to antineoplastic chemotherapy: Secondary | ICD-10-CM | POA: Diagnosis not present

## 2020-03-12 DIAGNOSIS — E785 Hyperlipidemia, unspecified: Secondary | ICD-10-CM | POA: Insufficient documentation

## 2020-03-12 DIAGNOSIS — Z5112 Encounter for antineoplastic immunotherapy: Secondary | ICD-10-CM | POA: Insufficient documentation

## 2020-03-12 LAB — CBC WITH DIFFERENTIAL/PLATELET
Abs Immature Granulocytes: 0.02 10*3/uL (ref 0.00–0.07)
Basophils Absolute: 0 10*3/uL (ref 0.0–0.1)
Basophils Relative: 0 %
Eosinophils Absolute: 0.1 10*3/uL (ref 0.0–0.5)
Eosinophils Relative: 2 %
HCT: 36.8 % — ABNORMAL LOW (ref 39.0–52.0)
Hemoglobin: 11.4 g/dL — ABNORMAL LOW (ref 13.0–17.0)
Immature Granulocytes: 0 %
Lymphocytes Relative: 24 %
Lymphs Abs: 1.1 10*3/uL (ref 0.7–4.0)
MCH: 24.9 pg — ABNORMAL LOW (ref 26.0–34.0)
MCHC: 31 g/dL (ref 30.0–36.0)
MCV: 80.3 fL (ref 80.0–100.0)
Monocytes Absolute: 0.5 10*3/uL (ref 0.1–1.0)
Monocytes Relative: 11 %
Neutro Abs: 3 10*3/uL (ref 1.7–7.7)
Neutrophils Relative %: 63 %
Platelets: 277 10*3/uL (ref 150–400)
RBC: 4.58 MIL/uL (ref 4.22–5.81)
RDW: 16.9 % — ABNORMAL HIGH (ref 11.5–15.5)
WBC: 4.6 10*3/uL (ref 4.0–10.5)
nRBC: 0 % (ref 0.0–0.2)

## 2020-03-12 LAB — COMPREHENSIVE METABOLIC PANEL
ALT: 9 U/L (ref 0–44)
AST: 16 U/L (ref 15–41)
Albumin: 3.4 g/dL — ABNORMAL LOW (ref 3.5–5.0)
Alkaline Phosphatase: 63 U/L (ref 38–126)
Anion gap: 6 (ref 5–15)
BUN: 17 mg/dL (ref 8–23)
CO2: 24 mmol/L (ref 22–32)
Calcium: 8.9 mg/dL (ref 8.9–10.3)
Chloride: 106 mmol/L (ref 98–111)
Creatinine, Ser: 0.79 mg/dL (ref 0.61–1.24)
GFR, Estimated: >60 mL/min (ref >60)
Glucose, Bld: 100 mg/dL — ABNORMAL HIGH (ref 70–99)
Potassium: 3.9 mmol/L (ref 3.5–5.1)
Sodium: 136 mmol/L (ref 135–145)
Total Bilirubin: 0.7 mg/dL (ref 0.3–1.2)
Total Protein: 7.2 g/dL (ref 6.5–8.1)

## 2020-03-12 MED ORDER — HEPARIN SOD (PORK) LOCK FLUSH 100 UNIT/ML IV SOLN
500.0000 [IU] | Freq: Once | INTRAVENOUS | Status: AC
  Administered 2020-03-12: 500 [IU] via INTRAVENOUS
  Filled 2020-03-12: qty 5

## 2020-03-12 MED ORDER — SODIUM CHLORIDE 0.9 % IV SOLN
10.0000 mg/kg | Freq: Once | INTRAVENOUS | Status: AC
  Administered 2020-03-12: 860 mg via INTRAVENOUS
  Filled 2020-03-12: qty 10

## 2020-03-12 MED ORDER — HEPARIN SOD (PORK) LOCK FLUSH 100 UNIT/ML IV SOLN
INTRAVENOUS | Status: AC
  Filled 2020-03-12: qty 5

## 2020-03-12 MED ORDER — SODIUM CHLORIDE 0.9 % IV SOLN
Freq: Once | INTRAVENOUS | Status: AC
  Filled 2020-03-12: qty 250

## 2020-03-12 MED ORDER — SODIUM CHLORIDE 0.9% FLUSH
10.0000 mL | Freq: Once | INTRAVENOUS | Status: AC
  Administered 2020-03-12: 10 mL via INTRAVENOUS
  Filled 2020-03-12: qty 10

## 2020-03-12 NOTE — Progress Notes (Signed)
Pt tolerated infusion well with no signs of complications. VSS. Pt stable for discharge.   Mitchell Frye  

## 2020-03-13 NOTE — Progress Notes (Signed)
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 He underwent CT angios chest which did not show any PE. Soft tissue attenuation measuring 2.9 x 4 cm centered in the left hilum resulting in abrupt angulation and narrowing of the pulmonary arteries and the central left lower lobe airways. Additional ipsilateral hilar and subcarinal adenopathy. No contralateral adenopathy. Consolidative masslike opacity 3.6 x 3.1 cm in size and contiguous with more central perihilar soft tissue attenuation. Overall findings concerning for primary bronchogenic carcinoma.  Patient lives with his sister who is his main caregiver. He does not drive but is independent of his ADLs. Reports ongoing fatigue and occasional retrosternal chest pain. He has been evaluated by GI in the past for reflux as well. Appetieis fair and weight is stable.Denies any significant shortness of breath at this time.Patient is an ex-smoker and smoked for about 20 years but quit smoking back in 1994  PET CT scan showed hypermetabolic mass in the left lower lobe with mediastinal adenopathy and right paratracheal adenopathy. No evidence of distant metastatic disease. Pathology was consistent with non-small cell lung cancer favor adenocarcinoma. Cells were positive for TTF-1 and negative  for p40.  Patient started concurrent carbotaxol radiation and then had allergic reaction to Taxol for which she was switched to Abraxane. Given the short supply of Abraxane patient received carbo Alimta midway through his concurrent chemoradiation.Scans post chemoradiation showed partial response and patient will be started on maintenance durvalumab on 01/14/2020  Interval history-patient reports doing well and denies any complaints at this time. His itching has resolved. Denies any difficulty swallowing.  ECOG PS- 1 Pain scale- 0   Review of systems- Review of Systems  Constitutional: Negative for chills, fever, malaise/fatigue and weight loss.  HENT: Negative for congestion, ear discharge and nosebleeds.   Eyes: Negative for blurred vision.  Respiratory: Negative for cough, hemoptysis, sputum production, shortness of breath and wheezing.   Cardiovascular: Negative for chest pain, palpitations, orthopnea and claudication.  Gastrointestinal: Negative for abdominal pain, blood in stool, constipation, diarrhea, heartburn, melena, nausea and vomiting.  Genitourinary: Negative for dysuria, flank pain, frequency, hematuria and urgency.  Musculoskeletal: Negative for back pain, joint pain and myalgias.  Skin: Negative for rash.  Neurological: Negative for dizziness, tingling, focal weakness, seizures, weakness and headaches.  Endo/Heme/Allergies: Does not bruise/bleed easily.  Psychiatric/Behavioral: Negative for depression and suicidal ideas. The patient does not have insomnia.       Allergies  Allergen Reactions  . Taxol [Paclitaxel] Other (See Comments)    Chest pain, hip pain, coughing , wheezing     Past Medical History:  Diagnosis Date  . Cancer ([place])   . Dyspnea   . Hyperlipidemia   . Hypertension      Past Surgical History:  Procedure Laterality Date  . PORTA CATH INSERTION N/A 10/22/2019   Procedure: PORTA CATH INSERTION;  Surgeon: Katha Cabal, MD;  Location:  [place]  CV LAB;  Service: Cardiovascular;  Laterality: N/A;  . VIDEO BRONCHOSCOPY WITH ENDOBRONCHIAL ULTRASOUND N/A 09/25/2019   Procedure: VIDEO BRONCHOSCOPY WITH ENDOBRONCHIAL ULTRASOUND;  Surgeon: Tyler Pita, MD;  Location: [facility] ORS;  Service: Pulmonary;  Laterality: N/A;    Social History   Socioeconomic History  . Marital status: Single    Spouse name: Not on file  . Number of children: Not on file  . Years of education: Not on file  . Highest education level: Not on file  Occupational History  . Not on file  Tobacco Use  . Smoking status: Former Smoker    Packs/day: 1.00    Years: 20.00    Pack years: 20.00    Types: Cigarettes    Quit date: 1994    Years since quitting: 28.0  . Smokeless tobacco: Never Used  Vaping Use  . Vaping Use: Never used  Substance and Sexual Activity  . Alcohol use: Yes    Comment: occ  . Drug use: Never  . Sexual activity: Not on file  Other Topics Concern  . Not on file  Social History Narrative  . Not on file   Social Determinants of Health   Financial Resource Strain: Not on file  Food Insecurity: Not on file  Transportation Needs: Not on file  Physical Activity: Not on file  Stress: Not on file  Social Connections: Not on file  Intimate Partner Violence: Not on file    Family History  Problem Relation Age of Onset  . Cancer Brother      Current Outpatient Medications:  .  acetaminophen (TYLENOL) 500 MG tablet, Take 1,000 mg by mouth every 6 (six) hours as needed for mild pain, moderate pain or headache., Disp: , Rfl:  .  albuterol (VENTOLIN HFA) 108 (90 Base) MCG/ACT inhaler, Inhale 2 puffs into the lungs every 4 (four) hours as needed for wheezing or shortness of breath., Disp: 1 each, Rfl: 2 .  ANORO ELLIPTA 62.5-25 MCG/INH AEPB, Inhale 1 puff into the lungs daily., Disp: 1 each, Rfl: 2 .  benzonatate (TESSALON PERLES) 100 MG capsule, Take 1 capsule (100 mg total) by mouth every 6 (six) hours as needed for  cough., Disp: 90 capsule, Rfl: 0 .  famotidine (PEPCID) 20 MG tablet, TAKE 1 TABLET(20 MG) BY MOUTH TWICE DAILY, Disp: 60 tablet, Rfl: 1 .  fentaNYL (DURAGESIC) 12 MCG/HR, Place 1 patch onto the skin every 3 (three) days., Disp: 10 patch, Rfl: 0 .  folic acid (FOLVITE) 1 MG tablet, Take 1 tablet (1 mg total) by mouth daily., Disp: 30 tablet, Rfl: 3 .  gabapentin (NEURONTIN) 100 MG capsule, TAKE 1 CAPSULE(100 MG) BY MOUTH THREE TIMES DAILY, Disp: 90 capsule, Rfl: 0 .  lidocaine (XYLOCAINE) 2 % solution, Use as directed 2 mLs in the mouth or throat., Disp: , Rfl:  .  lidocaine-prilocaine (EMLA) cream, Apply 1 application topically once., Disp: , Rfl:  .  oxyCODONE (OXY IR/ROXICODONE) 5 MG immediate release tablet, Take 2 tablets (10 mg total) by mouth every 4 (four) hours as needed for severe pain., Disp: 90 tablet, Rfl: 0 .  pantoprazole (PROTONIX) 40 MG tablet, Take 1 tablet (40 mg total) by mouth daily., Disp: 30 tablet, Rfl: 3 .  potassium chloride (KLOR-CON) 20 MEQ packet, Take 20 mEq by mouth daily., Disp: 10 packet, Rfl: 0 .  sucralfate (CARAFATE) 1 g tablet, DISSOLVE 1 TABLET IN 3 TO 4 TABLESPOONS OF WARM WATER, THEN SWISH AND SWALLOW BY MOUTH THREE TIMES  DAILY, Disp: 90 tablet, Rfl: 1 .  traZODone (DESYREL) 50 MG tablet, TAKE 1 TABLET(50 MG) BY MOUTH AT BEDTIME, Disp: 30 tablet, Rfl: 1  Physical exam:  Vitals:   03/12/20 1054  BP: 139/83  Pulse: 75  Resp: 18  Temp: 97.9 F (36.6 C)  TempSrc: Tympanic  SpO2: 100%  Weight: 190 lb (86.2 kg)   Physical Exam Constitutional:      General: He is not in acute distress. Eyes:     Extraocular Movements: EOM normal.  Cardiovascular:     Rate and Rhythm: Normal rate and regular rhythm.     Heart sounds: Normal heart sounds.  Pulmonary:     Effort: Pulmonary effort is normal.     Breath sounds: Normal breath sounds.  Abdominal:     General: Bowel sounds are normal.     Palpations: Abdomen is soft.  Skin:    General: Skin is warm and  dry.  Neurological:     Mental Status: He is alert and oriented to person, place, and time.      CMP Latest Ref Rng & Units 03/12/2020  Glucose 70 - 99 mg/dL 100(H)  BUN 8 - 23 mg/dL 17  Creatinine 0.61 - 1.24 mg/dL 0.79  Sodium 135 - 145 mmol/L 136  Potassium 3.5 - 5.1 mmol/L 3.9  Chloride 98 - 111 mmol/L 106  CO2 22 - 32 mmol/L 24  Calcium 8.9 - 10.3 mg/dL 8.9  Total Protein 6.5 - 8.1 g/dL 7.2  Total Bilirubin 0.3 - 1.2 mg/dL 0.7  Alkaline Phos 38 - 126 U/L 63  AST 15 - 41 U/L 16  ALT 0 - 44 U/L 9   CBC Latest Ref Rng & Units 03/12/2020  WBC 4.0 - 10.5 K/uL 4.6  Hemoglobin 13.0 - 17.0 g/dL 11.4(L)  Hematocrit 39.0 - 52.0 % 36.8(L)  Platelets 150 - 400 K/uL 277      Assessment and plan- Patient is a 70 y.o. 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   He is here for on treatment assessment prior to cycle 4 of maintenance durvalumab  Counts okay to proceed with cycle 4 of maintenance durvalumab today. I will see him back in 2 weeks for cycle 5. Overall he is tolerating treatment well without any significant side effects.  Chemo-induced anemia: Improving. Continue to monitor   Visit Diagnosis 1. Malignant neoplasm of lower lobe of left lung ([place])   2. Encounter for antineoplastic immunotherapy      Dr. Randa Evens, MD, MPH [facility] at [facility] [phone] 03/13/2020 12:49 PM

## 2020-03-16 ENCOUNTER — Inpatient Hospital Stay (HOSPITAL_BASED_OUTPATIENT_CLINIC_OR_DEPARTMENT_OTHER): Payer: Medicaid Other | Admitting: Hospice and Palliative Medicine

## 2020-03-16 DIAGNOSIS — Z515 Encounter for palliative care: Secondary | ICD-10-CM | POA: Diagnosis not present

## 2020-03-16 DIAGNOSIS — C3432 Malignant neoplasm of lower lobe, left bronchus or lung: Secondary | ICD-10-CM

## 2020-03-16 MED ORDER — ALBUTEROL SULFATE HFA 108 (90 BASE) MCG/ACT IN AERS: 2.0000 | INHALATION_SPRAY | RESPIRATORY_TRACT | 2 refills | Status: DC | PRN

## 2020-03-16 NOTE — Progress Notes (Signed)
Virtual Visit via Telephone Note  I connected with Mitchell Frye on 03/16/20 at  1:00 PM EST by telephone and verified that I am speaking with the correct person using two identifiers.  Location: Patient: home Provider: clinic   I discussed the limitations, risks, security and privacy concerns of performing an evaluation and management service by telephone and the availability of in person appointments. I also discussed with the patient that there may be a patient responsible charge related to this service. The patient expressed understanding and agreed to proceed.   History of Present Illness: Mitchell Frye is a 70 y.o. 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  PET/CT showed hypermetabolic mass in the left lower lobe with mediastinal and right paratracheal adenopathy.  Pathology was consistent with adenocarcinoma.  Patient was started on concurrent chemoradiation with plan for maintenance immunotherapy.  He was referred to palliative care to help address goals and manage ongoing symptoms.   Observations/Objective: I called and spoke with patient by phone.  Also spoke with sister by phone.  Patient reports that he is doing well.  He denies any significant changes or concerns.  No severe or distressing symptoms at present.  He does have occasional shortness of breath but says that it is unchanged in characteristic or severity.  He does use his inhaler on occasion and requests it to be refilled.  Patient reports stable performance status.  Appetite is reportedly good.  Patient will see Dr. Janese Banks next week.  Assessment and Plan: Stage IIIb adenocarcinoma of the right lung -on maintenance durvalumab.  Patient scheduled to see Dr. Janese Banks on 2/10.  Seems to be doing well symptomatically but will refill albuterol inhaler  Follow Up Instructions: Follow-up MyChart visit 2 months   I discussed  the assessment and treatment plan with the patient. The patient was provided an opportunity to ask questions and all were answered. The patient agreed with the plan and demonstrated an understanding of the instructions.   The patient was advised to call back or seek an in-person evaluation if the symptoms worsen or if the condition fails to improve as anticipated.  I provided 6 minutes of non-face-to-face time during this encounter.   Irean Hong, NP

## 2020-03-26 ENCOUNTER — Encounter: Payer: Self-pay | Admitting: Oncology

## 2020-03-26 ENCOUNTER — Inpatient Hospital Stay (HOSPITAL_BASED_OUTPATIENT_CLINIC_OR_DEPARTMENT_OTHER): Payer: Medicaid Other | Admitting: Oncology

## 2020-03-26 ENCOUNTER — Inpatient Hospital Stay: Payer: Medicaid Other | Attending: Oncology

## 2020-03-26 ENCOUNTER — Inpatient Hospital Stay: Payer: Medicaid Other

## 2020-03-26 VITALS — BP 144/79 | HR 68 | Temp 97.3°F | Wt 189.6 lb

## 2020-03-26 DIAGNOSIS — I10 Essential (primary) hypertension: Secondary | ICD-10-CM | POA: Insufficient documentation

## 2020-03-26 DIAGNOSIS — R059 Cough, unspecified: Secondary | ICD-10-CM | POA: Diagnosis not present

## 2020-03-26 DIAGNOSIS — Z79899 Other long term (current) drug therapy: Secondary | ICD-10-CM | POA: Insufficient documentation

## 2020-03-26 DIAGNOSIS — Z5112 Encounter for antineoplastic immunotherapy: Secondary | ICD-10-CM

## 2020-03-26 DIAGNOSIS — R0602 Shortness of breath: Secondary | ICD-10-CM | POA: Insufficient documentation

## 2020-03-26 DIAGNOSIS — E785 Hyperlipidemia, unspecified: Secondary | ICD-10-CM | POA: Insufficient documentation

## 2020-03-26 DIAGNOSIS — C3432 Malignant neoplasm of lower lobe, left bronchus or lung: Secondary | ICD-10-CM

## 2020-03-26 DIAGNOSIS — Z87891 Personal history of nicotine dependence: Secondary | ICD-10-CM | POA: Diagnosis not present

## 2020-03-26 DIAGNOSIS — G620 Drug-induced polyneuropathy: Secondary | ICD-10-CM | POA: Diagnosis not present

## 2020-03-26 LAB — COMPREHENSIVE METABOLIC PANEL
ALT: 9 U/L (ref 0–44)
AST: 17 U/L (ref 15–41)
Albumin: 3.6 g/dL (ref 3.5–5.0)
Alkaline Phosphatase: 66 U/L (ref 38–126)
Anion gap: 8 (ref 5–15)
BUN: 12 mg/dL (ref 8–23)
CO2: 24 mmol/L (ref 22–32)
Calcium: 9.1 mg/dL (ref 8.9–10.3)
Chloride: 108 mmol/L (ref 98–111)
Creatinine, Ser: 0.82 mg/dL (ref 0.61–1.24)
GFR, Estimated: >60 mL/min (ref >60)
Glucose, Bld: 97 mg/dL (ref 70–99)
Potassium: 3.6 mmol/L (ref 3.5–5.1)
Sodium: 140 mmol/L (ref 135–145)
Total Bilirubin: 0.7 mg/dL (ref 0.3–1.2)
Total Protein: 7.4 g/dL (ref 6.5–8.1)

## 2020-03-26 LAB — CBC WITH DIFFERENTIAL/PLATELET
Abs Immature Granulocytes: 0.01 10*3/uL (ref 0.00–0.07)
Basophils Absolute: 0 10*3/uL (ref 0.0–0.1)
Basophils Relative: 0 %
Eosinophils Absolute: 0.1 10*3/uL (ref 0.0–0.5)
Eosinophils Relative: 2 %
HCT: 36.8 % — ABNORMAL LOW (ref 39.0–52.0)
Hemoglobin: 11.7 g/dL — ABNORMAL LOW (ref 13.0–17.0)
Immature Granulocytes: 0 %
Lymphocytes Relative: 19 %
Lymphs Abs: 1 10*3/uL (ref 0.7–4.0)
MCH: 25.1 pg — ABNORMAL LOW (ref 26.0–34.0)
MCHC: 31.8 g/dL (ref 30.0–36.0)
MCV: 78.8 fL — ABNORMAL LOW (ref 80.0–100.0)
Monocytes Absolute: 0.5 10*3/uL (ref 0.1–1.0)
Monocytes Relative: 9 %
Neutro Abs: 3.7 10*3/uL (ref 1.7–7.7)
Neutrophils Relative %: 70 %
Platelets: 278 10*3/uL (ref 150–400)
RBC: 4.67 MIL/uL (ref 4.22–5.81)
RDW: 17.4 % — ABNORMAL HIGH (ref 11.5–15.5)
WBC: 5.3 10*3/uL (ref 4.0–10.5)
nRBC: 0 % (ref 0.0–0.2)

## 2020-03-26 LAB — TSH: TSH: 1.115 u[IU]/mL (ref 0.350–4.500)

## 2020-03-26 MED ORDER — HEPARIN SOD (PORK) LOCK FLUSH 100 UNIT/ML IV SOLN
500.0000 [IU] | Freq: Once | INTRAVENOUS | Status: AC | PRN
  Administered 2020-03-26: 500 [IU]
  Filled 2020-03-26: qty 5

## 2020-03-26 MED ORDER — SODIUM CHLORIDE 0.9 % IV SOLN
10.0000 mg/kg | Freq: Once | INTRAVENOUS | Status: AC
  Administered 2020-03-26: 860 mg via INTRAVENOUS
  Filled 2020-03-26: qty 10

## 2020-03-26 MED ORDER — SODIUM CHLORIDE 0.9 % IV SOLN
Freq: Once | INTRAVENOUS | Status: AC
  Filled 2020-03-26: qty 250

## 2020-03-26 NOTE — Progress Notes (Signed)
Pt tolerated infusion well. Pt stable at discharge. 

## 2020-03-27 NOTE — Progress Notes (Addendum)
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 He underwent CT angios chest which did not show any PE. Soft tissue attenuation measuring 2.9 x 4 cm centered in the left hilum resulting in abrupt angulation and narrowing of the pulmonary arteries and the central left lower lobe airways. Additional ipsilateral hilar and subcarinal adenopathy. No contralateral adenopathy. Consolidative masslike opacity 3.6 x 3.1 cm in size and contiguous with more central perihilar soft tissue attenuation. Overall findings concerning for primary bronchogenic carcinoma.  Patient lives with his sister who is his main caregiver. He does not drive but is independent of his ADLs. Reports ongoing fatigue and occasional retrosternal chest pain. He has been evaluated by GI in the past for reflux as well. Appetieis fair and weight is stable.Denies any significant shortness of breath at this time.Patient is an ex-smoker and smoked for about 20 years but quit smoking back in 1994  PET CT scan showed hypermetabolic mass in the left lower lobe with mediastinal adenopathy and right paratracheal adenopathy. No evidence of distant metastatic disease. Pathology was consistent with non-small cell lung cancer favor adenocarcinoma. Cells were positive for TTF-1 and negative  for p40.  Patient started concurrent carbotaxol radiation and then had allergic reaction to Taxol for which she was switched to Abraxane. Given the short supply of Abraxane patient received carbo Alimta midway through his concurrent chemoradiation.Scans post chemoradiation showed partial response and patient will be started on maintenance durvalumabon 01/14/2020  Interval history-patient reports doing well overall.  Denies any significant pruritus.  He has not had any further episodes of syncope.  Denies any shortness of breath cough or diarrhea.  ECOG PS- 1 Pain scale- 0 Opioid associated constipation- no  Review of systems- Review of Systems  Constitutional: Negative for chills, fever, malaise/fatigue and weight loss.  HENT: Negative for congestion, ear discharge and nosebleeds.   Eyes: Negative for blurred vision.  Respiratory: Negative for cough, hemoptysis, sputum production, shortness of breath and wheezing.   Cardiovascular: Negative for chest pain, palpitations, orthopnea and claudication.  Gastrointestinal: Negative for abdominal pain, blood in stool, constipation, diarrhea, heartburn, melena, nausea and vomiting.  Genitourinary: Negative for dysuria, flank pain, frequency, hematuria and urgency.  Musculoskeletal: Negative for back pain, joint pain and myalgias.  Skin: Negative for rash.  Neurological: Negative for dizziness, tingling, focal weakness, seizures, weakness and headaches.  Endo/Heme/Allergies: Does not bruise/bleed easily.  Psychiatric/Behavioral: Negative for depression and suicidal ideas. The patient does not have insomnia.       Allergies  Allergen Reactions  . Taxol [Paclitaxel] Other (See Comments)    Chest pain, hip pain, coughing , wheezing     Past Medical History:  Diagnosis Date  . Cancer ([place])   . Dyspnea   . Hyperlipidemia   . Hypertension      Past Surgical History:  Procedure Laterality Date  . PORTA CATH INSERTION N/A 10/22/2019    Procedure:  PORTA CATH INSERTION;  Surgeon: Katha Cabal, MD;  Location: [place] [facility];  Service: Cardiovascular;  Laterality: N/A;  . VIDEO BRONCHOSCOPY WITH ENDOBRONCHIAL ULTRASOUND N/A 09/25/2019   Procedure: VIDEO BRONCHOSCOPY WITH ENDOBRONCHIAL ULTRASOUND;  Surgeon: Tyler Pita, MD;  Location: [facility];  Service: Pulmonary;  Laterality: N/A;    Social History   Socioeconomic History  . Marital status: Single    Spouse name: Not on file  . Number of children: Not on file  . Years of education: Not on file  . Highest education level: Not on file  Occupational History  . Not on file  Tobacco Use  . Smoking status: Former Smoker    Packs/day: 1.00    Years: 20.00    Pack years: 20.00    Types: Cigarettes    Quit date: 1994    Years since quitting: 28.1  . Smokeless tobacco: Never Used  Vaping Use  . Vaping Use: Never used  Substance and Sexual Activity  . Alcohol use: Yes    Comment: occ  . Drug use: Never  . Sexual activity: Not on file  Other Topics Concern  . Not on file  Social History Narrative  . Not on file   Social Determinants of Health   Financial Resource Strain: Not on file  Food Insecurity: Not on file  Transportation Needs: Not on file  Physical Activity: Not on file  Stress: Not on file  Social Connections: Not on file  Intimate Partner Violence: Not on file    Family History  Problem Relation Age of Onset  . Cancer Brother      Current Outpatient Medications:  .  acetaminophen (TYLENOL) 500 MG tablet, Take 1,000 mg by mouth every 6 (six) hours as needed for mild pain, moderate pain or headache., Disp: , Rfl:  .  albuterol (VENTOLIN HFA) 108 (90 Base) MCG/ACT inhaler, Inhale 2 puffs into the lungs every 4 (four) hours as needed for wheezing or shortness of breath., Disp: 1 each, Rfl: 2 .  ANORO ELLIPTA 62.5-25 MCG/INH AEPB, Inhale 1 puff into the lungs daily., Disp: 1 each, Rfl: 2 .  benzonatate (TESSALON PERLES) 100 MG  capsule, Take 1 capsule (100 mg total) by mouth every 6 (six) hours as needed for cough., Disp: 90 capsule, Rfl: 0 .  famotidine (PEPCID) 20 MG tablet, TAKE 1 TABLET(20 MG) BY MOUTH TWICE DAILY, Disp: 60 tablet, Rfl: 1 .  fentaNYL (DURAGESIC) 12 MCG/HR, Place 1 patch onto the skin every 3 (three) days., Disp: 10 patch, Rfl: 0 .  folic acid (FOLVITE) 1 MG tablet, Take 1 tablet (1 mg total) by mouth daily., Disp: 30 tablet, Rfl: 3 .  gabapentin (NEURONTIN) 100 MG capsule, TAKE 1 CAPSULE(100 MG) BY MOUTH THREE TIMES DAILY, Disp: 90 capsule, Rfl: 0 .  lidocaine (XYLOCAINE) 2 % solution, Use as directed 2 mLs in the mouth or throat., Disp: , Rfl:  .  lidocaine-prilocaine (EMLA) cream, Apply 1 application topically once., Disp: , Rfl:  .  oxyCODONE (OXY IR/ROXICODONE) 5 MG immediate release tablet, Take 2 tablets (10 mg total) by mouth every 4 (four) hours as needed for severe pain., Disp: 90 tablet, Rfl: 0 .  pantoprazole (PROTONIX) 40 MG tablet, Take 1 tablet (40 mg total) by mouth daily., Disp: 30 tablet, Rfl: 3 .  potassium chloride (KLOR-CON) 20 MEQ packet, Take 20 mEq by mouth daily., Disp: 10 packet, Rfl: 0 .  sucralfate (CARAFATE) 1 g tablet, DISSOLVE 1 TABLET IN 3 TO  4 TABLESPOONS OF WARM WATER, THEN SWISH AND SWALLOW BY MOUTH THREE TIMES DAILY, Disp: 90 tablet, Rfl: 1 .  traZODone (DESYREL) 50 MG tablet, TAKE 1 TABLET(50 MG) BY MOUTH AT BEDTIME, Disp: 30 tablet, Rfl: 1  Physical exam:  Vitals:   03/26/20 1022  BP: (!) 144/79  Pulse: 68  Temp: (!) 97.3 F (36.3 C)  TempSrc: Tympanic  SpO2: 99%  Weight: 189 lb 9.6 oz (86 kg)   Physical Exam Constitutional:      General: He is not in acute distress. Eyes:     Extraocular Movements: EOM normal.  Cardiovascular:     Rate and Rhythm: Normal rate and regular rhythm.     Heart sounds: Normal heart sounds.  Pulmonary:     Effort: Pulmonary effort is normal.     Breath sounds: Normal breath sounds.  Abdominal:     General: Bowel sounds  are normal.     Palpations: Abdomen is soft.  Skin:    General: Skin is warm and dry.  Neurological:     Mental Status: He is alert and oriented to person, place, and time.      CMP Latest Ref Rng & Units 03/26/2020  Glucose 70 - 99 mg/dL 97  BUN 8 - 23 mg/dL 12  Creatinine 0.61 - 1.24 mg/dL 0.82  Sodium 135 - 145 mmol/L 140  Potassium 3.5 - 5.1 mmol/L 3.6  Chloride 98 - 111 mmol/L 108  CO2 22 - 32 mmol/L 24  Calcium 8.9 - 10.3 mg/dL 9.1  Total Protein 6.5 - 8.1 g/dL 7.4  Total Bilirubin 0.3 - 1.2 mg/dL 0.7  Alkaline Phos 38 - 126 U/L 66  AST 15 - 41 U/L 17  ALT 0 - 44 U/L 9   CBC Latest Ref Rng & Units 03/26/2020  WBC 4.0 - 10.5 K/uL 5.3  Hemoglobin 13.0 - 17.0 g/dL 11.7(L)  Hematocrit 39.0 - 52.0 % 36.8(L)  Platelets 150 - 400 K/uL 278     Assessment and plan- Patient is a 70 y.o. 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 He is here for on treatment assessment prior to cycle 5 of maintenance durvalumab  Counts okay to proceed with cycle 5 of maintenance durvalumab today.  I will see him back in 2 weeks for cycle 6.  Plan is to complete adjuvant treatment for 1 year.  Patient is tolerating immunotherapy well without any significant side effects.  He Will be getting repeat CT chest abdomen and pelvis with contrast on 04/02/2020.   Visit Diagnosis 1. Malignant neoplasm of lower lobe of left lung ([place])   2. Encounter for antineoplastic immunotherapy      Dr. Randa Evens, MD, MPH [facility] [phone] 03/27/2020 9:56 AM

## 2020-03-31 ENCOUNTER — Telehealth: Payer: Self-pay | Admitting: Oncology

## 2020-03-31 NOTE — Telephone Encounter (Signed)
Pt sister called to cancel his CT scan on 2/17, She wants it moved to next week prior to his treatment on 2/24. Please call her with updated CT appt.

## 2020-03-31 NOTE — Telephone Encounter (Signed)
Spoke with pt's sister to review CT appt on 2/23. Provided instructions and pt's sister confirmed the day and time. Sending AVS.

## 2020-03-31 NOTE — Telephone Encounter (Signed)
Sister was called and told that we have to move the appt out for the ct because insurance has not approved the entire scan/ we moved it out 2/23 and sister is aware of it

## 2020-04-02 ENCOUNTER — Ambulatory Visit: Payer: Medicaid Other

## 2020-04-08 ENCOUNTER — Ambulatory Visit
Admission: RE | Admit: 2020-04-08 | Discharge: 2020-04-08 | Disposition: A | Discharge status: Home / Self Care | Payer: Medicaid Other | Source: Ambulatory Visit | Attending: Oncology | Admitting: Oncology

## 2020-04-08 ENCOUNTER — Other Ambulatory Visit: Payer: Self-pay

## 2020-04-08 DIAGNOSIS — Z5112 Encounter for antineoplastic immunotherapy: Secondary | ICD-10-CM

## 2020-04-08 DIAGNOSIS — C3432 Malignant neoplasm of lower lobe, left bronchus or lung: Secondary | ICD-10-CM

## 2020-04-08 MED ORDER — IOHEXOL 300 MG/ML  SOLN
100.0000 mL | Freq: Once | INTRAMUSCULAR | Status: AC | PRN
  Administered 2020-04-08: 100 mL via INTRAVENOUS

## 2020-04-09 ENCOUNTER — Inpatient Hospital Stay: Payer: Medicaid Other

## 2020-04-09 ENCOUNTER — Encounter: Payer: Self-pay | Admitting: Oncology

## 2020-04-09 ENCOUNTER — Inpatient Hospital Stay (HOSPITAL_BASED_OUTPATIENT_CLINIC_OR_DEPARTMENT_OTHER): Payer: Medicaid Other | Admitting: Oncology

## 2020-04-09 VITALS — BP 142/78 | HR 62 | Temp 98.2°F | Resp 18 | Ht 70.0 in | Wt 189.4 lb

## 2020-04-09 DIAGNOSIS — C3432 Malignant neoplasm of lower lobe, left bronchus or lung: Secondary | ICD-10-CM | POA: Diagnosis not present

## 2020-04-09 DIAGNOSIS — Z5112 Encounter for antineoplastic immunotherapy: Secondary | ICD-10-CM

## 2020-04-09 LAB — CBC WITH DIFFERENTIAL/PLATELET
Abs Immature Granulocytes: 0.01 10*3/uL (ref 0.00–0.07)
Basophils Absolute: 0 10*3/uL (ref 0.0–0.1)
Basophils Relative: 0 %
Eosinophils Absolute: 0.1 10*3/uL (ref 0.0–0.5)
Eosinophils Relative: 1 %
HCT: 39.6 % (ref 39.0–52.0)
Hemoglobin: 12.4 g/dL — ABNORMAL LOW (ref 13.0–17.0)
Immature Granulocytes: 0 %
Lymphocytes Relative: 23 %
Lymphs Abs: 1.2 10*3/uL (ref 0.7–4.0)
MCH: 24.9 pg — ABNORMAL LOW (ref 26.0–34.0)
MCHC: 31.3 g/dL (ref 30.0–36.0)
MCV: 79.5 fL — ABNORMAL LOW (ref 80.0–100.0)
Monocytes Absolute: 0.6 10*3/uL (ref 0.1–1.0)
Monocytes Relative: 11 %
Neutro Abs: 3.3 10*3/uL (ref 1.7–7.7)
Neutrophils Relative %: 65 %
Platelets: 257 10*3/uL (ref 150–400)
RBC: 4.98 MIL/uL (ref 4.22–5.81)
RDW: 17.7 % — ABNORMAL HIGH (ref 11.5–15.5)
WBC: 5.1 10*3/uL (ref 4.0–10.5)
nRBC: 0 % (ref 0.0–0.2)

## 2020-04-09 LAB — COMPREHENSIVE METABOLIC PANEL
ALT: 7 U/L (ref 0–44)
AST: 16 U/L (ref 15–41)
Albumin: 3.7 g/dL (ref 3.5–5.0)
Alkaline Phosphatase: 63 U/L (ref 38–126)
Anion gap: 9 (ref 5–15)
BUN: 13 mg/dL (ref 8–23)
CO2: 23 mmol/L (ref 22–32)
Calcium: 8.9 mg/dL (ref 8.9–10.3)
Chloride: 107 mmol/L (ref 98–111)
Creatinine, Ser: 0.84 mg/dL (ref 0.61–1.24)
GFR, Estimated: >60 mL/min (ref >60)
Glucose, Bld: 100 mg/dL — ABNORMAL HIGH (ref 70–99)
Potassium: 3.8 mmol/L (ref 3.5–5.1)
Sodium: 139 mmol/L (ref 135–145)
Total Bilirubin: 0.7 mg/dL (ref 0.3–1.2)
Total Protein: 7.3 g/dL (ref 6.5–8.1)

## 2020-04-09 MED ORDER — SODIUM CHLORIDE 0.9% FLUSH
10.0000 mL | Freq: Once | INTRAVENOUS | Status: AC
  Administered 2020-04-09: 10 mL via INTRAVENOUS
  Filled 2020-04-09: qty 10

## 2020-04-09 MED ORDER — SODIUM CHLORIDE 0.9 % IV SOLN
Freq: Once | INTRAVENOUS | Status: AC
  Filled 2020-04-09: qty 250

## 2020-04-09 MED ORDER — SODIUM CHLORIDE 0.9 % IV SOLN
10.0000 mg/kg | Freq: Once | INTRAVENOUS | Status: AC
  Administered 2020-04-09: 860 mg via INTRAVENOUS
  Filled 2020-04-09: qty 10

## 2020-04-09 MED ORDER — HEPARIN SOD (PORK) LOCK FLUSH 100 UNIT/ML IV SOLN
500.0000 [IU] | Freq: Once | INTRAVENOUS | Status: DC | PRN
  Filled 2020-04-09: qty 5

## 2020-04-09 MED ORDER — HEPARIN SOD (PORK) LOCK FLUSH 100 UNIT/ML IV SOLN
INTRAVENOUS | Status: AC
  Filled 2020-04-09: qty 5

## 2020-04-09 MED ORDER — HEPARIN SOD (PORK) LOCK FLUSH 100 UNIT/ML IV SOLN
500.0000 [IU] | Freq: Once | INTRAVENOUS | Status: AC
  Administered 2020-04-09: 500 [IU] via INTRAVENOUS
  Filled 2020-04-09: qty 5

## 2020-04-09 NOTE — Progress Notes (Signed)
Pt sob going up steps today sat98%, no pain today.some numbness and tingling fingers and toes and run out of gabapentin. Not sleeping good. He will check to see if trazodone and if not we will prescribe it for him. He was given a list of meds to take home because he did not know about all of them

## 2020-04-09 NOTE — Progress Notes (Signed)
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 He underwent CT angios chest which did not show any PE. Soft tissue attenuation measuring 2.9 x 4 cm centered in the left hilum resulting in abrupt angulation and narrowing of the pulmonary arteries and the central left lower lobe airways. Additional ipsilateral hilar and subcarinal adenopathy. No contralateral adenopathy. Consolidative masslike opacity 3.6 x 3.1 cm in size and contiguous with more central perihilar soft tissue attenuation. Overall findings concerning for primary bronchogenic carcinoma.  Patient lives with his sister who is his main caregiver. He does not drive but is independent of his ADLs. Reports ongoing fatigue and occasional retrosternal chest pain. He has been evaluated by GI in the past for reflux as well. Appetieis fair and weight is stable.Denies any significant shortness of breath at this time.Patient is an ex-smoker and smoked for about 20 years but quit smoking back in 1994  PET CT scan showed hypermetabolic mass in the left lower lobe with mediastinal adenopathy and right paratracheal adenopathy. No evidence of distant metastatic disease. Pathology was consistent with non-small cell lung cancer favor adenocarcinoma. Cells were positive for TTF-1 and negative  for p40.  Patient started concurrent carbotaxol radiation and then had allergic reaction to Taxol for which she was switched to Abraxane. Given the short supply of Abraxane patient received carbo Alimta midway through his concurrent chemoradiation.Scans post chemoradiation showed partial response and patient will be started on maintenance durvalumabon 01/14/2020   Interval history-patient reports some tingling numbness in his extremities.  Reports difficulty sleeping.  Occasional exertional shortness of breath.  Denies other complaints at this time  ECOG PS- 1 Pain scale- 0  Review of systems- Review of Systems  Constitutional: Positive for malaise/fatigue. Negative for chills, fever and weight loss.  HENT: Negative for congestion, ear discharge and nosebleeds.   Eyes: Negative for blurred vision.  Respiratory: Positive for shortness of breath. Negative for cough, hemoptysis, sputum production and wheezing.   Cardiovascular: Negative for chest pain, palpitations, orthopnea and claudication.  Gastrointestinal: Negative for abdominal pain, blood in stool, constipation, diarrhea, heartburn, melena, nausea and vomiting.  Genitourinary: Negative for dysuria, flank pain, frequency, hematuria and urgency.  Musculoskeletal: Negative for back pain, joint pain and myalgias.  Skin: Negative for rash.  Neurological: Positive for sensory change (Peripheral neuropathy). Negative for dizziness, tingling, focal weakness, seizures, weakness and headaches.  Endo/Heme/Allergies: Does not bruise/bleed easily.  Psychiatric/Behavioral: Negative for depression and suicidal ideas. The patient does not have insomnia.       Allergies  Allergen Reactions  . Taxol [Paclitaxel] Other (See Comments)    Chest pain, hip pain, coughing , wheezing     Past Medical History:  Diagnosis Date  . Cancer ([place])   . Dyspnea   . Hyperlipidemia   . Hypertension      Past Surgical History:  Procedure Laterality  Date  . PORTA CATH INSERTION N/A 10/22/2019  Procedure: PORTA CATH INSERTION;  Surgeon: Katha Cabal, MD;  Location: [place] [facility];  Service: Cardiovascular;  Laterality: N/A;  . VIDEO BRONCHOSCOPY WITH ENDOBRONCHIAL ULTRASOUND N/A 09/25/2019   Procedure: VIDEO BRONCHOSCOPY WITH ENDOBRONCHIAL ULTRASOUND;  Surgeon: Tyler Pita, MD;  Location: [facility];  Service: Pulmonary;  Laterality: N/A;    Social History   Socioeconomic History  . Marital status: Single    Spouse name: Not on file  . Number of children: Not on file  . Years of education: Not on file  . Highest education level: Not on file  Occupational History  . Not on file  Tobacco Use  . Smoking status: Former Smoker    Packs/day: 1.00    Years: 20.00    Pack years: 20.00    Types: Cigarettes    Quit date: 1994    Years since quitting: 28.1  . Smokeless tobacco: Never Used  Vaping Use  . Vaping Use: Never used  Substance and Sexual Activity  . Alcohol use: Yes    Comment: 2 beers a month  . Drug use: Never  . Sexual activity: Not on file  Other Topics Concern  . Not on file  Social History Narrative  . Not on file   Social Determinants of Health   Financial Resource Strain: Not on file  Food Insecurity: Not on file  Transportation Needs: Not on file  Physical Activity: Not on file  Stress: Not on file  Social Connections: Not on file  Intimate Partner Violence: Not on file    Family History  Problem Relation Age of Onset  . Cancer Brother      Current Outpatient Medications:  .  acetaminophen (TYLENOL) 500 MG tablet, Take 1,000 mg by mouth every 6 (six) hours as needed for mild pain, moderate pain or headache., Disp: , Rfl:  .  albuterol (VENTOLIN HFA) 108 (90 Base) MCG/ACT inhaler, Inhale 2 puffs into the lungs every 4 (four) hours as needed for wheezing or shortness of breath., Disp: 1 each, Rfl: 2 .  ANORO ELLIPTA 62.5-25 MCG/INH AEPB, Inhale 1 puff into the lungs daily., Disp: 1  each, Rfl: 2 .  benzonatate (TESSALON PERLES) 100 MG capsule, Take 1 capsule (100 mg total) by mouth every 6 (six) hours as needed for cough., Disp: 90 capsule, Rfl: 0 .  famotidine (PEPCID) 20 MG tablet, TAKE 1 TABLET(20 MG) BY MOUTH TWICE DAILY, Disp: 60 tablet, Rfl: 1 .  lidocaine-prilocaine (EMLA) cream, Apply 1 application topically once., Disp: , Rfl:  .  potassium chloride (KLOR-CON) 20 MEQ packet, Take 20 mEq by mouth daily., Disp: 10 packet, Rfl: 0 .  sucralfate (CARAFATE) 1 g tablet, DISSOLVE 1 TABLET IN 3 TO 4 TABLESPOONS OF WARM WATER, THEN SWISH AND SWALLOW BY MOUTH THREE TIMES DAILY, Disp: 90 tablet, Rfl: 1 .  fentaNYL (DURAGESIC) 12 MCG/HR, Place 1 patch onto the skin every 3 (three) days. (Patient not taking: Reported on 04/09/2020), Disp: 10 patch, Rfl: 0 .  folic acid (FOLVITE) 1 MG tablet, Take 1 tablet (1 mg total) by mouth daily. (Patient not taking: Reported on 04/09/2020), Disp: 30 tablet, Rfl: 3 .  gabapentin (NEURONTIN) 100 MG capsule, TAKE 1 CAPSULE(100 MG) BY MOUTH THREE TIMES DAILY (Patient not taking: Reported on 04/09/2020), Disp: 90 capsule, Rfl: 0 .  lidocaine (XYLOCAINE) 2 % solution, Use as directed 2 mLs in the mouth or throat. (Patient not taking: Reported on 04/09/2020), Disp: , Rfl:  .  oxyCODONE (OXY IR/ROXICODONE) 5  MG immediate release tablet, Take 2 tablets (10 mg total) by mouth every 4 (four) hours as needed for severe pain. (Patient not taking: Reported on 04/09/2020), Disp: 90 tablet, Rfl: 0 .  pantoprazole (PROTONIX) 40 MG tablet, Take 1 tablet (40 mg total) by mouth daily. (Patient not taking: Reported on 04/09/2020), Disp: 30 tablet, Rfl: 3 .  traZODone (DESYREL) 50 MG tablet, TAKE 1 TABLET(50 MG) BY MOUTH AT BEDTIME (Patient not taking: Reported on 04/09/2020), Disp: 30 tablet, Rfl: 1 No current facility-administered medications for this visit.  Facility-Administered Medications Ordered in Other Visits:  .  heparin lock flush 100 unit/mL, 500 Units,  Intracatheter, Once PRN, Sindy Guadeloupe, MD  Physical exam:  Vitals:   04/09/20 0922  BP: (!) 142/78  Pulse: 62  Resp: 18  Temp: 98.2 F (36.8 C)  TempSrc: Oral  SpO2: 98%  Weight: 189 lb 6.4 oz (85.9 kg)  Height: 5\' 10"  (1.778 m)   Physical Exam Constitutional:      General: He is not in acute distress. Eyes:     Extraocular Movements: EOM normal.  Cardiovascular:     Rate and Rhythm: Normal rate and regular rhythm.     Heart sounds: Normal heart sounds.  Pulmonary:     Effort: Pulmonary effort is normal.     Breath sounds: Normal breath sounds.  Abdominal:     General: Bowel sounds are normal.     Palpations: Abdomen is soft.  Skin:    General: Skin is warm and dry.  Neurological:     Mental Status: He is alert and oriented to person, place, and time.      CMP Latest Ref Rng & Units 04/09/2020  Glucose 70 - 99 mg/dL 100(H)  BUN 8 - 23 mg/dL 13  Creatinine 0.61 - 1.24 mg/dL 0.84  Sodium 135 - 145 mmol/L 139  Potassium 3.5 - 5.1 mmol/L 3.8  Chloride 98 - 111 mmol/L 107  CO2 22 - 32 mmol/L 23  Calcium 8.9 - 10.3 mg/dL 8.9  Total Protein 6.5 - 8.1 g/dL 7.3  Total Bilirubin 0.3 - 1.2 mg/dL 0.7  Alkaline Phos 38 - 126 U/L 63  AST 15 - 41 U/L 16  ALT 0 - 44 U/L 7   CBC Latest Ref Rng & Units 04/09/2020  WBC 4.0 - 10.5 K/uL 5.1  Hemoglobin 13.0 - 17.0 g/dL 12.4(L)  Hematocrit 39.0 - 52.0 % 39.6  Platelets 150 - 400 K/uL 257      Assessment and plan- Patient is a 70 y.o. 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   He is here for on treatment assessment prior to cycle 6 of maintenance durvalumab  Counts okay to proceed with cycle 6 of maintenance durvalumab today.  I will see him back in 2 weeks for cycle 7.  Patient had CT chest abdomen and pelvis with contrast done yesterday but has not been reported yet.  Based on my image review I do not see any overt evidence of progression.  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  Continue gabapentin.  We will renew his prescription   Visit Diagnosis 1. Encounter for antineoplastic immunotherapy   2. Malignant neoplasm of lower lobe of left lung ([place])      Dr. Randa Evens, MD, MPH [facility] [phone] 04/09/2020 12:15 PM

## 2020-04-23 ENCOUNTER — Encounter: Payer: Self-pay | Admitting: Oncology

## 2020-04-23 ENCOUNTER — Inpatient Hospital Stay: Payer: Medicaid Other

## 2020-04-23 ENCOUNTER — Inpatient Hospital Stay (HOSPITAL_BASED_OUTPATIENT_CLINIC_OR_DEPARTMENT_OTHER): Payer: Medicaid Other | Admitting: Oncology

## 2020-04-23 ENCOUNTER — Ambulatory Visit
Admission: RE | Admit: 2020-04-23 | Discharge: 2020-04-23 | Disposition: A | Discharge status: Home / Self Care | Payer: Medicaid Other | Source: Ambulatory Visit | Attending: Radiation Oncology | Admitting: Radiation Oncology

## 2020-04-23 ENCOUNTER — Inpatient Hospital Stay: Payer: Medicaid Other | Attending: Oncology

## 2020-04-23 VITALS — BP 134/74 | HR 73 | Temp 96.3°F | Resp 20 | Wt 190.0 lb

## 2020-04-23 DIAGNOSIS — Z5111 Encounter for antineoplastic chemotherapy: Secondary | ICD-10-CM | POA: Insufficient documentation

## 2020-04-23 DIAGNOSIS — Z9221 Personal history of antineoplastic chemotherapy: Secondary | ICD-10-CM | POA: Diagnosis not present

## 2020-04-23 DIAGNOSIS — Z5112 Encounter for antineoplastic immunotherapy: Secondary | ICD-10-CM

## 2020-04-23 DIAGNOSIS — I10 Essential (primary) hypertension: Secondary | ICD-10-CM | POA: Insufficient documentation

## 2020-04-23 DIAGNOSIS — Z923 Personal history of irradiation: Secondary | ICD-10-CM | POA: Insufficient documentation

## 2020-04-23 DIAGNOSIS — Z79899 Other long term (current) drug therapy: Secondary | ICD-10-CM | POA: Diagnosis not present

## 2020-04-23 DIAGNOSIS — J432 Centrilobular emphysema: Secondary | ICD-10-CM | POA: Diagnosis not present

## 2020-04-23 DIAGNOSIS — C3432 Malignant neoplasm of lower lobe, left bronchus or lung: Secondary | ICD-10-CM | POA: Diagnosis present

## 2020-04-23 DIAGNOSIS — R5383 Other fatigue: Secondary | ICD-10-CM | POA: Diagnosis not present

## 2020-04-23 DIAGNOSIS — E785 Hyperlipidemia, unspecified: Secondary | ICD-10-CM | POA: Diagnosis not present

## 2020-04-23 DIAGNOSIS — Z87891 Personal history of nicotine dependence: Secondary | ICD-10-CM | POA: Insufficient documentation

## 2020-04-23 DIAGNOSIS — Z95828 Presence of other vascular implants and grafts: Secondary | ICD-10-CM

## 2020-04-23 LAB — COMPREHENSIVE METABOLIC PANEL
ALT: 9 U/L (ref 0–44)
AST: 18 U/L (ref 15–41)
Albumin: 3.6 g/dL (ref 3.5–5.0)
Alkaline Phosphatase: 65 U/L (ref 38–126)
Anion gap: 7 (ref 5–15)
BUN: 15 mg/dL (ref 8–23)
CO2: 23 mmol/L (ref 22–32)
Calcium: 9 mg/dL (ref 8.9–10.3)
Chloride: 109 mmol/L (ref 98–111)
Creatinine, Ser: 1 mg/dL (ref 0.61–1.24)
GFR, Estimated: >60 mL/min (ref >60)
Glucose, Bld: 114 mg/dL — ABNORMAL HIGH (ref 70–99)
Potassium: 3.5 mmol/L (ref 3.5–5.1)
Sodium: 139 mmol/L (ref 135–145)
Total Bilirubin: 1 mg/dL (ref 0.3–1.2)
Total Protein: 7 g/dL (ref 6.5–8.1)

## 2020-04-23 LAB — CBC WITH DIFFERENTIAL/PLATELET
Abs Immature Granulocytes: 0.01 10*3/uL (ref 0.00–0.07)
Basophils Absolute: 0 10*3/uL (ref 0.0–0.1)
Basophils Relative: 0 %
Eosinophils Absolute: 0 10*3/uL (ref 0.0–0.5)
Eosinophils Relative: 1 %
HCT: 38.6 % — ABNORMAL LOW (ref 39.0–52.0)
Hemoglobin: 12.3 g/dL — ABNORMAL LOW (ref 13.0–17.0)
Immature Granulocytes: 0 %
Lymphocytes Relative: 17 %
Lymphs Abs: 0.9 10*3/uL (ref 0.7–4.0)
MCH: 24.8 pg — ABNORMAL LOW (ref 26.0–34.0)
MCHC: 31.9 g/dL (ref 30.0–36.0)
MCV: 78 fL — ABNORMAL LOW (ref 80.0–100.0)
Monocytes Absolute: 0.6 10*3/uL (ref 0.1–1.0)
Monocytes Relative: 11 %
Neutro Abs: 3.8 10*3/uL (ref 1.7–7.7)
Neutrophils Relative %: 71 %
Platelets: 271 10*3/uL (ref 150–400)
RBC: 4.95 MIL/uL (ref 4.22–5.81)
RDW: 18.7 % — ABNORMAL HIGH (ref 11.5–15.5)
WBC: 5.3 10*3/uL (ref 4.0–10.5)
nRBC: 0 % (ref 0.0–0.2)

## 2020-04-23 MED ORDER — SODIUM CHLORIDE 0.9 % IV SOLN
Freq: Once | INTRAVENOUS | Status: AC
  Filled 2020-04-23: qty 250

## 2020-04-23 MED ORDER — SODIUM CHLORIDE 0.9% FLUSH
10.0000 mL | INTRAVENOUS | Status: DC | PRN
  Filled 2020-04-23: qty 10

## 2020-04-23 MED ORDER — SODIUM CHLORIDE 0.9% FLUSH
10.0000 mL | Freq: Once | INTRAVENOUS | Status: AC
  Administered 2020-04-23: 10 mL via INTRAVENOUS
  Filled 2020-04-23: qty 10

## 2020-04-23 MED ORDER — HEPARIN SOD (PORK) LOCK FLUSH 100 UNIT/ML IV SOLN
500.0000 [IU] | Freq: Once | INTRAVENOUS | Status: DC
  Filled 2020-04-23: qty 5

## 2020-04-23 MED ORDER — TRAZODONE HCL 50 MG PO TABS: ORAL_TABLET | ORAL | 1 refills | Status: DC

## 2020-04-23 MED ORDER — SODIUM CHLORIDE 0.9 % IV SOLN
10.0000 mg/kg | Freq: Once | INTRAVENOUS | Status: AC
  Administered 2020-04-23: 860 mg via INTRAVENOUS
  Filled 2020-04-23: qty 7.2

## 2020-04-23 MED ORDER — HEPARIN SOD (PORK) LOCK FLUSH 100 UNIT/ML IV SOLN
500.0000 [IU] | Freq: Once | INTRAVENOUS | Status: AC | PRN
  Administered 2020-04-23: 500 [IU]
  Filled 2020-04-23: qty 5

## 2020-04-23 NOTE — Progress Notes (Signed)
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 He underwent CT angios chest which did not show any PE. Soft tissue attenuation measuring 2.9 x 4 cm centered in the left hilum resulting in abrupt angulation and narrowing of the pulmonary arteries and the central left lower lobe airways. Additional ipsilateral hilar and subcarinal adenopathy. No contralateral adenopathy. Consolidative masslike opacity 3.6 x 3.1 cm in size and contiguous with more central perihilar soft tissue attenuation. Overall findings concerning for primary bronchogenic carcinoma.  Patient lives with his sister who is his main caregiver. He does not drive but is independent of his ADLs. Reports ongoing fatigue and occasional retrosternal chest pain. He has been evaluated by GI in the past for reflux as well. Appetieis fair and weight is stable.Denies any significant shortness of breath at this time.Patient is an ex-smoker and smoked for about 20 years but quit smoking back in 1994  PET CT scan showed hypermetabolic mass in the left lower lobe with mediastinal adenopathy and right paratracheal adenopathy. No evidence of distant metastatic disease. Pathology was consistent with non-small cell lung cancer favor adenocarcinoma. Cells were positive for TTF-1 and negative  for p40.  Patient started concurrent carbotaxol radiation and then had allergic reaction to Taxol for which she was switched to Abraxane. Given the short supply of Abraxane patient received carbo Alimta midway through his concurrent chemoradiation.Scans post chemoradiation showed partial response and patient will be started on maintenance durvalumabon 01/14/2020  Interval history-patient reports doing well overall and the fact that he cannot sleep more than 2 to 3 hours at night.  Denies any significant nausea or vomiting.  Denies any cough shortness of breath or sputum production.  ECOG PS- 1 Pain scale- 0   Review of systems- Review of Systems  Constitutional: Negative for chills, fever, malaise/fatigue and weight loss.  HENT: Negative for congestion, ear discharge and nosebleeds.   Eyes: Negative for blurred vision.  Respiratory: Negative for cough, hemoptysis, sputum production, shortness of breath and wheezing.   Cardiovascular: Negative for chest pain, palpitations, orthopnea and claudication.  Gastrointestinal: Negative for abdominal pain, blood in stool, constipation, diarrhea, heartburn, melena, nausea and vomiting.  Genitourinary: Negative for dysuria, flank pain, frequency, hematuria and urgency.  Musculoskeletal: Negative for back pain, joint pain and myalgias.  Skin: Negative for rash.  Neurological: Negative for dizziness, tingling, focal weakness, seizures, weakness and headaches.  Endo/Heme/Allergies: Does not bruise/bleed easily.  Psychiatric/Behavioral: Negative for depression and suicidal ideas. The patient has insomnia.       Allergies  Allergen Reactions  . Taxol [Paclitaxel] Other (See Comments)    Chest pain, hip pain, coughing , wheezing     Past Medical History:  Diagnosis Date  . Cancer ([place])   . Dyspnea   . Hyperlipidemia   . Hypertension      Past Surgical History:  Procedure Laterality Date  . PORTA CATH INSERTION N/A 10/22/2019  Procedure: PORTA CATH INSERTION;  Surgeon: Katha Cabal, MD;  Location: [place] [facility];  Service: Cardiovascular;  Laterality: N/A;  . VIDEO BRONCHOSCOPY WITH ENDOBRONCHIAL ULTRASOUND N/A 09/25/2019   Procedure: VIDEO BRONCHOSCOPY WITH ENDOBRONCHIAL ULTRASOUND;  Surgeon: Tyler Pita, MD;  Location: [facility];  Service: Pulmonary;  Laterality: N/A;    Social History   Socioeconomic History  . Marital status: Single    Spouse name: Not on file  . Number of children: Not on file  . Years of education: Not on file  . Highest education level: Not on file  Occupational History  . Not on file  Tobacco Use  . Smoking status: Former Smoker    Packs/day: 1.00    Years: 20.00    Pack years: 20.00    Types: Cigarettes    Quit date: 1994    Years since quitting: 28.2  . Smokeless tobacco: Never Used  Vaping Use  . Vaping Use: Never used  Substance and Sexual Activity  . Alcohol use: Yes    Comment: 2 beers a month  . Drug use: Never  . Sexual activity: Not on file  Other Topics Concern  . Not on file  Social History Narrative  . Not on file   Social Determinants of Health   Financial Resource Strain: Not on file  Food Insecurity: Not on file  Transportation Needs: Not on file  Physical Activity: Not on file  Stress: Not on file  Social Connections: Not on file  Intimate Partner Violence: Not on file    Family History  Problem Relation Age of Onset  . Cancer Brother      Current Outpatient Medications:  .  acetaminophen (TYLENOL) 500 MG tablet, Take 1,000 mg by mouth every 6 (six) hours as needed for mild pain, moderate pain or headache., Disp: , Rfl:  .  albuterol (VENTOLIN HFA) 108 (90 Base) MCG/ACT inhaler, Inhale 2 puffs into the lungs every 4 (four) hours as needed for wheezing or shortness of breath., Disp: 1 each, Rfl: 2 .  ANORO ELLIPTA 62.5-25 MCG/INH AEPB, Inhale 1 puff into the lungs daily., Disp: 1 each, Rfl: 2 .  benzonatate (TESSALON PERLES)  100 MG capsule, Take 1 capsule (100 mg total) by mouth every 6 (six) hours as needed for cough., Disp: 90 capsule, Rfl: 0 .  famotidine (PEPCID) 20 MG tablet, TAKE 1 TABLET(20 MG) BY MOUTH TWICE DAILY, Disp: 60 tablet, Rfl: 1 .  fentaNYL (DURAGESIC) 12 MCG/HR, Place 1 patch onto the skin every 3 (three) days., Disp: 10 patch, Rfl: 0 .  folic acid (FOLVITE) 1 MG tablet, Take 1 tablet (1 mg total) by mouth daily., Disp: 30 tablet, Rfl: 3 .  gabapentin (NEURONTIN) 100 MG capsule, TAKE 1 CAPSULE(100 MG) BY MOUTH THREE TIMES DAILY, Disp: 90 capsule, Rfl: 0 .  lidocaine-prilocaine (EMLA) cream, Apply 1 application topically once., Disp: , Rfl:  .  potassium chloride (KLOR-CON) 20 MEQ packet, Take 20 mEq by mouth daily., Disp: 10 packet, Rfl: 0 .  sucralfate (CARAFATE) 1 g tablet, DISSOLVE 1 TABLET IN 3 TO 4 TABLESPOONS OF WARM WATER, THEN SWISH AND SWALLOW BY MOUTH THREE TIMES DAILY, Disp: 90 tablet, Rfl: 1 .  lidocaine (XYLOCAINE) 2 % solution, Use as directed 2 mLs in the mouth or throat. (Patient not taking: Reported on 04/23/2020), Disp: , Rfl:  .  oxyCODONE (OXY IR/ROXICODONE) 5 MG immediate release tablet, Take 2 tablets (10 mg total) by mouth every 4 (four) hours as needed  for severe pain. (Patient not taking: No sig reported), Disp: 90 tablet, Rfl: 0 .  pantoprazole (PROTONIX) 40 MG tablet, Take 1 tablet (40 mg total) by mouth daily. (Patient not taking: No sig reported), Disp: 30 tablet, Rfl: 3 .  traZODone (DESYREL) 50 MG tablet, TAKE 1 TABLET(50 MG) BY MOUTH AT BEDTIME, Disp: 30 tablet, Rfl: 1 No current facility-administered medications for this visit.  Facility-Administered Medications Ordered in Other Visits:  .  heparin lock flush 100 unit/mL, 500 Units, Intravenous, Once, Sindy Guadeloupe, MD .  sodium chloride flush (NS) 0.9 % injection 10 mL, 10 mL, Intracatheter, PRN, Sindy Guadeloupe, MD  Physical exam:  Vitals:   04/23/20 1059  BP: 134/74  Pulse: 73  Resp: 20  Temp: (!) 96.3 F  (35.7 C)  TempSrc: Tympanic  SpO2: 99%  Weight: 190 lb (86.2 kg)   Physical Exam Constitutional:      General: He is not in acute distress. Cardiovascular:     Rate and Rhythm: Normal rate and regular rhythm.     Heart sounds: Normal heart sounds.  Pulmonary:     Effort: Pulmonary effort is normal.     Breath sounds: Normal breath sounds.  Abdominal:     General: Bowel sounds are normal.     Palpations: Abdomen is soft.  Skin:    General: Skin is warm and dry.  Neurological:     Mental Status: He is alert and oriented to person, place, and time.      CMP Latest Ref Rng & Units 04/23/2020  Glucose 70 - 99 mg/dL 114(H)  BUN 8 - 23 mg/dL 15  Creatinine 0.61 - 1.24 mg/dL 1.00  Sodium 135 - 145 mmol/L 139  Potassium 3.5 - 5.1 mmol/L 3.5  Chloride 98 - 111 mmol/L 109  CO2 22 - 32 mmol/L 23  Calcium 8.9 - 10.3 mg/dL 9.0  Total Protein 6.5 - 8.1 g/dL 7.0  Total Bilirubin 0.3 - 1.2 mg/dL 1.0  Alkaline Phos 38 - 126 U/L 65  AST 15 - 41 U/L 18  ALT 0 - 44 U/L 9   CBC Latest Ref Rng & Units 04/23/2020  WBC 4.0 - 10.5 K/uL 5.3  Hemoglobin 13.0 - 17.0 g/dL 12.3(L)  Hematocrit 39.0 - 52.0 % 38.6(L)  Platelets 150 - 400 K/uL 271    No images are attached to the encounter.  CT CHEST ABDOMEN PELVIS W CONTRAST  Result Date: 04/09/2020 CLINICAL DATA:  History of lung cancer.  Restaging. EXAM: CT CHEST, ABDOMEN, AND PELVIS WITH CONTRAST TECHNIQUE: Multidetector CT imaging of the chest, abdomen and pelvis was performed following the standard protocol during bolus administration of intravenous contrast. CONTRAST:  137mL OMNIPAQUE IOHEXOL 300 MG/ML  SOLN COMPARISON:  Prior CTs 12/17/2019 and PET-CT 09/16/2019. FINDINGS: CT CHEST FINDINGS Cardiovascular: Right IJ Port-A-Cath extends to the superior cavoatrial junction. No acute vascular findings are seen. There is mild aortic atherosclerosis. There is chronic extrinsic narrowing and possible occlusion of the left lower lobe pulmonary artery  by the infrahilar mass which appears unchanged. No evidence of acute pulmonary embolism on nondedicated imaging. The heart size is normal. There is no pericardial effusion. Mediastinum/Nodes: There are no enlarged mediastinal, hilar or axillary lymph nodes. The thyroid gland, trachea and esophagus demonstrate no significant findings. Lungs/Pleura: Minimal left pleural fluid. No pneumothorax. The left infrahilar mass has decreased in size, now measuring approximately 3.4 x 2.8 cm on image 73/4 (4.9 x 3.5 cm previously). There is chronic narrowing of the left  lower lobe bronchus and chronic volume loss in the left lower lobe. Mildly progressive left perihilar pulmonary opacities attributed to radiation therapy. No new or enlarging pulmonary nodules. Stable mild centrilobular emphysema. Musculoskeletal/Chest wall: No chest wall mass or suspicious osseous findings. CT ABDOMEN AND PELVIS FINDINGS Hepatobiliary: The liver is normal in density without suspicious focal abnormality. A tiny low-density lesion anteriorly in the dome of the right hepatic lobe on image 44/2 is stable. The gallbladder is incompletely distended. No evidence of gallstones, gallbladder wall thickening or biliary dilatation. Pancreas: A well-circumscribed cystic lesion in the proximal pancreatic tail has mildly enlarged, now measuring 1.4 cm on image 59/2 (previously 1.0 cm). This measures water density and shows no apparent enhancement. No associated pancreatic ductal dilatation or surrounding inflammation. Spleen: Normal in size without focal abnormality. Adrenals/Urinary Tract: Both adrenal glands appear normal. Multiple simple renal cysts bilaterally are unchanged. No evidence of renal mass, urinary tract calculus or hydronephrosis. The bladder appears unremarkable for its degree of distention. Stomach/Bowel: The stomach appears unremarkable for its degree of distension. No evidence of bowel wall thickening, distention or surrounding inflammatory  change. The appendix appears normal. There is moderate stool throughout the colon. Vascular/Lymphatic: There are no enlarged abdominal or pelvic lymph nodes. Aortic and branch vessel atherosclerosis. No aneurysm or large vessel occlusion. The portal, superior mesenteric and splenic veins are patent. Reproductive: The prostate gland is enlarged and heterogeneous with a stable low-density lesion measuring 1.7 cm on the left (image 110/2). Other: Stable small umbilical hernia containing only fat. No ascites or peritoneal nodularity. Musculoskeletal: No acute or significant osseous findings. IMPRESSION: 1. Interval decrease in size of left infrahilar mass consistent with response to therapy. Increased left perihilar opacities are likely treatment related. 2. No evidence of local recurrence or metastatic disease. 3. Stable chronic extrinsic narrowing and possible occlusion of the left lower lobe pulmonary artery by the infrahilar mass. 4. Mildly enlarged cystic lesion in the proximal pancreatic tail, likely a pseudocyst or indolent neoplasm. This demonstrates no aggressive characteristics by CT and can probably be adequately followed with routine CT surveillance. Consider MRI for further characterization. 5. Aortic Atherosclerosis (ICD10-I70.0) and Emphysema (ICD10-J43.9). Electronically Signed   By: Richardean Sale M.D.   On: 04/09/2020 15:39     Assessment and plan- Patient is a 70 y.o. male withstage IIIBadenocarcinoma of the left lung cT2 cN3 cM0.He is here for on treatment assessment prior to cycle 7 of maintenance durvalumab  Counts okay to proceed with cycle 7 of maintenance durvalumab today.  I will see him back in 2 weeks time with port labs CBC with differential, CMP for cycle 8.CT chest abdomen and pelvis from 04/08/2020 continue to show decrease in the size of left infrahilar mass and no evidence of progressive disease.   Visit Diagnosis 1. Encounter for antineoplastic immunotherapy   2.  Malignant neoplasm of lower lobe of left lung ([place] )      Dr. Randa Evens, MD, MPH [facility] at [facility] [phone] 04/23/2020 1:59 PM

## 2020-04-23 NOTE — Progress Notes (Signed)
Radiation Oncology Follow up Note  Name: Mitchell Frye   Date:   04/23/2020 MRN:  [id number] DOB: March 06, 1950    This 70 y.o. male presents to the clinic today for 62-month follow-up status post concurrent chemoradiation therapy for stage IIIb (T2 N3 M0) non-small cell lung cancer of the left hilum presumed to be adenocarcinoma.Marland Kitchen  REFERRING PROVIDER: Center, [employer]*  HPI: Patient is a 70 year old male now out 4 months having completed concurrent chemoradiation therapy for presumed stage IIIb adenocarcinoma of the left hilum.  Seen today in routine follow-up he is doing well.  He states when the weather is extremely cold he does have a more productive cough.  He otherwise is asymptomatic specifically denies cough hemoptysis chest tightness or dysphagia.  He had a recent CT scan.  Of chest abdomen pelvis showing interval decrease size in left infrahilar mass consistent with response.  No evidence of local recurrence or progressive or metastatic disease is noted.  Patient is currently onmaintenance durvalumab  COMPLICATIONS OF TREATMENT: none  FOLLOW UP COMPLIANCE: keeps appointments   PHYSICAL EXAM:  There were no vitals taken for this visit. Well-developed well-nourished patient in NAD. HEENT reveals PERLA, EOMI, discs not visualized.  Oral cavity is clear. No oral mucosal lesions are identified. Neck is clear without evidence of cervical or supraclavicular adenopathy. Lungs are clear to A&P. Cardiac examination is essentially unremarkable with regular rate and rhythm without murmur rub or thrill. Abdomen is benign with no organomegaly or masses noted. Motor sensory and DTR levels are equal and symmetric in the upper and lower extremities. Cranial nerves II through XII are grossly intact. Proprioception is intact. No peripheral adenopathy or edema is identified. No motor or sensory levels are noted. Crude visual fields are within normal range.  RADIOLOGY RESULTS: CT scans of chest abdomen  pelvis reviewed compatible with above-stated findings  PLAN: At this time patient is doing well excellent response by CT criteria he is fairly asymptomatic except for intermittent cough.  I have asked to see him back in 6 months for follow-up with a repeat CT scan at that time.  Patient continues close follow-up care and maintenance durvalumab under medical oncology's direction.  Patient knows to call with any concerns.  I would like to take this opportunity to thank you for allowing me to participate in the care of your patient.Noreene Filbert, MD

## 2020-05-07 ENCOUNTER — Inpatient Hospital Stay: Payer: Medicaid Other

## 2020-05-07 ENCOUNTER — Inpatient Hospital Stay (HOSPITAL_BASED_OUTPATIENT_CLINIC_OR_DEPARTMENT_OTHER): Payer: Medicaid Other | Admitting: Oncology

## 2020-05-07 ENCOUNTER — Encounter: Payer: Self-pay | Admitting: Oncology

## 2020-05-07 VITALS — BP 147/84 | HR 69 | Temp 97.6°F | Resp 18 | Wt 192.0 lb

## 2020-05-07 DIAGNOSIS — C3432 Malignant neoplasm of lower lobe, left bronchus or lung: Secondary | ICD-10-CM | POA: Diagnosis not present

## 2020-05-07 DIAGNOSIS — Z5112 Encounter for antineoplastic immunotherapy: Secondary | ICD-10-CM | POA: Diagnosis not present

## 2020-05-07 DIAGNOSIS — Z5111 Encounter for antineoplastic chemotherapy: Secondary | ICD-10-CM | POA: Diagnosis not present

## 2020-05-07 LAB — CBC WITH DIFFERENTIAL/PLATELET
Abs Immature Granulocytes: 0.01 10*3/uL (ref 0.00–0.07)
Basophils Absolute: 0 10*3/uL (ref 0.0–0.1)
Basophils Relative: 0 %
Eosinophils Absolute: 0.1 10*3/uL (ref 0.0–0.5)
Eosinophils Relative: 1 %
HCT: 38.6 % — ABNORMAL LOW (ref 39.0–52.0)
Hemoglobin: 12.2 g/dL — ABNORMAL LOW (ref 13.0–17.0)
Immature Granulocytes: 0 %
Lymphocytes Relative: 18 %
Lymphs Abs: 1 10*3/uL (ref 0.7–4.0)
MCH: 24.7 pg — ABNORMAL LOW (ref 26.0–34.0)
MCHC: 31.6 g/dL (ref 30.0–36.0)
MCV: 78.1 fL — ABNORMAL LOW (ref 80.0–100.0)
Monocytes Absolute: 0.6 10*3/uL (ref 0.1–1.0)
Monocytes Relative: 10 %
Neutro Abs: 3.9 10*3/uL (ref 1.7–7.7)
Neutrophils Relative %: 71 %
Platelets: 247 10*3/uL (ref 150–400)
RBC: 4.94 MIL/uL (ref 4.22–5.81)
RDW: 19.1 % — ABNORMAL HIGH (ref 11.5–15.5)
WBC: 5.4 10*3/uL (ref 4.0–10.5)
nRBC: 0 % (ref 0.0–0.2)

## 2020-05-07 LAB — COMPREHENSIVE METABOLIC PANEL
ALT: 10 U/L (ref 0–44)
AST: 18 U/L (ref 15–41)
Albumin: 3.7 g/dL (ref 3.5–5.0)
Alkaline Phosphatase: 63 U/L (ref 38–126)
Anion gap: 9 (ref 5–15)
BUN: 13 mg/dL (ref 8–23)
CO2: 23 mmol/L (ref 22–32)
Calcium: 8.8 mg/dL — ABNORMAL LOW (ref 8.9–10.3)
Chloride: 107 mmol/L (ref 98–111)
Creatinine, Ser: 0.95 mg/dL (ref 0.61–1.24)
GFR, Estimated: >60 mL/min (ref >60)
Glucose, Bld: 95 mg/dL (ref 70–99)
Potassium: 3.1 mmol/L — ABNORMAL LOW (ref 3.5–5.1)
Sodium: 139 mmol/L (ref 135–145)
Total Bilirubin: 1 mg/dL (ref 0.3–1.2)
Total Protein: 7.1 g/dL (ref 6.5–8.1)

## 2020-05-07 MED ORDER — SODIUM CHLORIDE 0.9 % IV SOLN
Freq: Once | INTRAVENOUS | Status: AC
  Filled 2020-05-07: qty 250

## 2020-05-07 MED ORDER — SODIUM CHLORIDE 0.9 % IV SOLN
10.0000 mg/kg | Freq: Once | INTRAVENOUS | Status: AC
  Administered 2020-05-07: 860 mg via INTRAVENOUS
  Filled 2020-05-07: qty 10

## 2020-05-07 MED ORDER — SODIUM CHLORIDE 0.9% FLUSH
10.0000 mL | INTRAVENOUS | Status: DC | PRN
  Administered 2020-05-07: 10 mL
  Filled 2020-05-07: qty 10

## 2020-05-07 MED ORDER — HEPARIN SOD (PORK) LOCK FLUSH 100 UNIT/ML IV SOLN
500.0000 [IU] | Freq: Once | INTRAVENOUS | Status: AC | PRN
  Administered 2020-05-07: 500 [IU]
  Filled 2020-05-07: qty 5

## 2020-05-07 NOTE — Progress Notes (Signed)
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 He underwent CT angios chest which did not show any PE. Soft tissue attenuation measuring 2.9 x 4 cm centered in the left hilum resulting in abrupt angulation and narrowing of the pulmonary arteries and the central left lower lobe airways. Additional ipsilateral hilar and subcarinal adenopathy. No contralateral adenopathy. Consolidative masslike opacity 3.6 x 3.1 cm in size and contiguous with more central perihilar soft tissue attenuation. Overall findings concerning for primary bronchogenic carcinoma.  Patient lives with his sister who is his main caregiver. He does not drive but is independent of his ADLs. Reports ongoing fatigue and occasional retrosternal chest pain. He has been evaluated by GI in the past for reflux as well. Appetieis fair and weight is stable.Denies any significant shortness of breath at this time.Patient is an ex-smoker and smoked for about 20 years but quit smoking back in 1994  PET CT scan showed hypermetabolic mass in the left lower lobe with mediastinal adenopathy and right paratracheal adenopathy. No evidence of distant metastatic disease. Pathology was consistent with non-small cell lung cancer favor adenocarcinoma. Cells were positive for TTF-1 and negative  for p40.  Patient started concurrent carbotaxol radiation and then had allergic reaction to Taxol for which she was switched to Abraxane. Given the short supply of Abraxane patient received carbo Alimta midway through his concurrent chemoradiation.Scans post chemoradiation showed partial response and patient will be started on maintenance durvalumabon 01/14/2020  Interval history-patient is doing well.  She reports mild exertional shortness of breath but denies any significant pain, difficulty swallowing, problems with itching that he had before  ECOG PS- 1 Pain scale- 0 Opioid associated constipation-no  Review of systems- Review of Systems  Constitutional: Negative for chills, fever, malaise/fatigue and weight loss.  HENT: Negative for congestion, ear discharge and nosebleeds.   Eyes: Negative for blurred vision.  Respiratory: Positive for shortness of breath. Negative for cough, hemoptysis, sputum production and wheezing.   Cardiovascular: Negative for chest pain, palpitations, orthopnea and claudication.  Gastrointestinal: Negative for abdominal pain, blood in stool, constipation, diarrhea, heartburn, melena, nausea and vomiting.  Genitourinary: Negative for dysuria, flank pain, frequency, hematuria and urgency.  Musculoskeletal: Negative for back pain, joint pain and myalgias.  Skin: Negative for rash.  Neurological: Negative for dizziness, tingling, focal weakness, seizures, weakness and headaches.  Endo/Heme/Allergies: Does not bruise/bleed easily.  Psychiatric/Behavioral: Negative for depression and suicidal ideas. The patient does not have insomnia.        Allergies  Allergen Reactions  . Taxol [Paclitaxel] Other (See Comments)    Chest pain, hip pain, coughing , wheezing     Past Medical History:  Diagnosis Date  . Cancer ([place])   . Dyspnea   . Hyperlipidemia   . Hypertension      Past Surgical History:  Procedure Laterality Date  . PORTA CATH INSERTION N/A  10/22/2019   Procedure: PORTA CATH  INSERTION;  Surgeon: Katha Cabal, MD;  Location: [facility];  Service: Cardiovascular;  Laterality: N/A;  . VIDEO BRONCHOSCOPY WITH ENDOBRONCHIAL ULTRASOUND N/A 09/25/2019   Procedure: VIDEO BRONCHOSCOPY WITH ENDOBRONCHIAL ULTRASOUND;  Surgeon: Tyler Pita, MD;  Location: [facility];  Service: Pulmonary;  Laterality: N/A;    Social History   Socioeconomic History  . Marital status: Single    Spouse name: Not on file  . Number of children: Not on file  . Years of education: Not on file  . Highest education level: Not on file  Occupational History  . Not on file  Tobacco Use  . Smoking status: Former Smoker    Packs/day: 1.00    Years: 20.00    Pack years: 20.00    Types: Cigarettes    Quit date: 1994    Years since quitting: 28.2  . Smokeless tobacco: Never Used  Vaping Use  . Vaping Use: Never used  Substance and Sexual Activity  . Alcohol use: Yes    Comment: 2 beers a month  . Drug use: Never  . Sexual activity: Not on file  Other Topics Concern  . Not on file  Social History Narrative  . Not on file   Social Determinants of Health   Financial Resource Strain: Not on file  Food Insecurity: Not on file  Transportation Needs: Not on file  Physical Activity: Not on file  Stress: Not on file  Social Connections: Not on file  Intimate Partner Violence: Not on file    Family History  Problem Relation Age of Onset  . Cancer Brother      Current Outpatient Medications:  .  acetaminophen (TYLENOL) 500 MG tablet, Take 1,000 mg by mouth every 6 (six) hours as needed for mild pain, moderate pain or headache., Disp: , Rfl:  .  albuterol (VENTOLIN HFA) 108 (90 Base) MCG/ACT inhaler, Inhale 2 puffs into the lungs every 4 (four) hours as needed for wheezing or shortness of breath., Disp: 1 each, Rfl: 2 .  ANORO ELLIPTA 62.5-25 MCG/INH AEPB, Inhale 1 puff into the lungs daily., Disp: 1 each, Rfl: 2 .  famotidine (PEPCID)  20 MG tablet, TAKE 1 TABLET(20 MG) BY MOUTH TWICE DAILY, Disp: 60 tablet, Rfl: 1 .  folic acid (FOLVITE) 1 MG tablet, Take 1 tablet (1 mg total) by mouth daily., Disp: 30 tablet, Rfl: 3 .  lidocaine-prilocaine (EMLA) cream, Apply 1 application topically once., Disp: , Rfl:  .  potassium chloride (KLOR-CON) 20 MEQ packet, Take 20 mEq by mouth daily., Disp: 10 packet, Rfl: 0 .  benzonatate (TESSALON PERLES) 100 MG capsule, Take 1 capsule (100 mg total) by mouth every 6 (six) hours as needed for cough. (Patient not taking: Reported on 05/07/2020), Disp: 90 capsule, Rfl: 0 .  fentaNYL (DURAGESIC) 12 MCG/HR, Place 1 patch onto the skin every 3 (three) days. (Patient not taking: Reported on 05/07/2020), Disp: 10 patch, Rfl: 0 .  gabapentin (NEURONTIN) 100 MG capsule, TAKE 1 CAPSULE(100 MG) BY MOUTH THREE TIMES DAILY (Patient not taking: Reported on 05/07/2020), Disp: 90 capsule, Rfl: 0 .  lidocaine (XYLOCAINE) 2 % solution, Use as directed 2 mLs in the mouth or throat. (Patient not taking: No sig reported), Disp: , Rfl:  .  oxyCODONE (OXY IR/ROXICODONE) 5 MG immediate release tablet, Take 2 tablets (10 mg total) by mouth every 4 (four) hours as needed for severe pain. (Patient not taking: No sig reported), Disp: 90 tablet, Rfl: 0 .  pantoprazole (  PROTONIX) 40 MG tablet, Take 1 tablet (40 mg total) by mouth daily. (Patient not taking: No sig reported), Disp: 30 tablet, Rfl: 3 .  sucralfate (CARAFATE) 1 g tablet, DISSOLVE 1 TABLET IN 3 TO 4 TABLESPOONS OF WARM WATER, THEN SWISH AND SWALLOW BY MOUTH THREE TIMES DAILY (Patient not taking: Reported on 05/07/2020), Disp: 90 tablet, Rfl: 1 .  traZODone (DESYREL) 50 MG tablet, TAKE 1 TABLET(50 MG) BY MOUTH AT BEDTIME (Patient not taking: Reported on 05/07/2020), Disp: 30 tablet, Rfl: 1 No current facility-administered medications for this visit.  Facility-Administered Medications Ordered in Other Visits:  .  sodium chloride flush (NS) 0.9 % injection 10 mL, 10 mL,  Intracatheter, PRN, Sindy Guadeloupe, MD, 10 mL at 05/07/20 1344  Physical exam:  Vitals:   05/07/20 1103  BP: (!) 147/84  Pulse: 69  Resp: 18  Temp: 97.6 F (36.4 C)  TempSrc: Tympanic  SpO2: 98%  Weight: 192 lb (87.1 kg)   Physical Exam Constitutional:      General: He is not in acute distress. Cardiovascular:     Rate and Rhythm: Normal rate and regular rhythm.     Heart sounds: Normal heart sounds.  Pulmonary:     Effort: Pulmonary effort is normal.     Breath sounds: Normal breath sounds.  Skin:    General: Skin is warm and dry.  Neurological:     Mental Status: He is alert and oriented to person, place, and time.      CMP Latest Ref Rng & Units 05/07/2020  Glucose 70 - 99 mg/dL 95  BUN 8 - 23 mg/dL 13  Creatinine 0.61 - 1.24 mg/dL 0.95  Sodium 135 - 145 mmol/L 139  Potassium 3.5 - 5.1 mmol/L 3.1(L)  Chloride 98 - 111 mmol/L 107  CO2 22 - 32 mmol/L 23  Calcium 8.9 - 10.3 mg/dL 8.8(L)  Total Protein 6.5 - 8.1 g/dL 7.1  Total Bilirubin 0.3 - 1.2 mg/dL 1.0  Alkaline Phos 38 - 126 U/L 63  AST 15 - 41 U/L 18  ALT 0 - 44 U/L 10   CBC Latest Ref Rng & Units 05/07/2020  WBC 4.0 - 10.5 K/uL 5.4  Hemoglobin 13.0 - 17.0 g/dL 12.2(L)  Hematocrit 39.0 - 52.0 % 38.6(L)  Platelets 150 - 400 K/uL 247    No images are attached to the encounter.  CT CHEST ABDOMEN PELVIS W CONTRAST  Result Date: 04/09/2020 CLINICAL DATA:  History of lung cancer.  Restaging. EXAM: CT CHEST, ABDOMEN, AND PELVIS WITH CONTRAST TECHNIQUE: Multidetector CT imaging of the chest, abdomen and pelvis was performed following the standard protocol during bolus administration of intravenous contrast. CONTRAST:  165mL OMNIPAQUE IOHEXOL 300 MG/ML  SOLN COMPARISON:  Prior CTs 12/17/2019 and PET-CT 09/16/2019. FINDINGS: CT CHEST FINDINGS Cardiovascular: Right IJ Port-A-Cath extends to the superior cavoatrial junction. No acute vascular findings are seen. There is mild aortic atherosclerosis. There is chronic  extrinsic narrowing and possible occlusion of the left lower lobe pulmonary artery by the infrahilar mass which appears unchanged. No evidence of acute pulmonary embolism on nondedicated imaging. The heart size is normal. There is no pericardial effusion. Mediastinum/Nodes: There are no enlarged mediastinal, hilar or axillary lymph nodes. The thyroid gland, trachea and esophagus demonstrate no significant findings. Lungs/Pleura: Minimal left pleural fluid. No pneumothorax. The left infrahilar mass has decreased in size, now measuring approximately 3.4 x 2.8 cm on image 73/4 (4.9 x 3.5 cm previously). There is chronic narrowing of the left lower lobe  bronchus and chronic volume loss in the left lower lobe. Mildly progressive left perihilar pulmonary opacities attributed to radiation therapy. No new or enlarging pulmonary nodules. Stable mild centrilobular emphysema. Musculoskeletal/Chest wall: No chest wall mass or suspicious osseous findings. CT ABDOMEN AND PELVIS FINDINGS Hepatobiliary: The liver is normal in density without suspicious focal abnormality. A tiny low-density lesion anteriorly in the dome of the right hepatic lobe on image 44/2 is stable. The gallbladder is incompletely distended. No evidence of gallstones, gallbladder wall thickening or biliary dilatation. Pancreas: A well-circumscribed cystic lesion in the proximal pancreatic tail has mildly enlarged, now measuring 1.4 cm on image 59/2 (previously 1.0 cm). This measures water density and shows no apparent enhancement. No associated pancreatic ductal dilatation or surrounding inflammation. Spleen: Normal in size without focal abnormality. Adrenals/Urinary Tract: Both adrenal glands appear normal. Multiple simple renal cysts bilaterally are unchanged. No evidence of renal mass, urinary tract calculus or hydronephrosis. The bladder appears unremarkable for its degree of distention. Stomach/Bowel: The stomach appears unremarkable for its degree of  distension. No evidence of bowel wall thickening, distention or surrounding inflammatory change. The appendix appears normal. There is moderate stool throughout the colon. Vascular/Lymphatic: There are no enlarged abdominal or pelvic lymph nodes. Aortic and branch vessel atherosclerosis. No aneurysm or large vessel occlusion. The portal, superior mesenteric and splenic veins are patent. Reproductive: The prostate gland is enlarged and heterogeneous with a stable low-density lesion measuring 1.7 cm on the left (image 110/2). Other: Stable small umbilical hernia containing only fat. No ascites or peritoneal nodularity. Musculoskeletal: No acute or significant osseous findings. IMPRESSION: 1. Interval decrease in size of left infrahilar mass consistent with response to therapy. Increased left perihilar opacities are likely treatment related. 2. No evidence of local recurrence or metastatic disease. 3. Stable chronic extrinsic narrowing and possible occlusion of the left lower lobe pulmonary artery by the infrahilar mass. 4. Mildly enlarged cystic lesion in the proximal pancreatic tail, likely a pseudocyst or indolent neoplasm. This demonstrates no aggressive characteristics by CT and can probably be adequately followed with routine CT surveillance. Consider MRI for further characterization. 5. Aortic Atherosclerosis (ICD10-I70.0) and Emphysema (ICD10-J43.9). Electronically Signed   By: Richardean Sale M.D.   On: 04/09/2020 15:39     Assessment and plan- Patient is a 70 y.o. 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 He is here for on treatment assessment prior to cycle 8 of maintenance durvalumab  Counts okay to proceed with cycle 8 of maintenance durvalumab today.  I will see him back in 2 weeks for cycle 9.  Plan to repeat scans in May 2022.  Overall tolerating treatment well without any significant side effects   Visit Diagnosis 1. Encounter for antineoplastic immunotherapy   2.  Malignant neoplasm of lower lobe of left lung ([place])      Dr. Randa Evens, MD, MPH [facility] at [facility] [phone] 05/07/2020 2:03 PM

## 2020-05-11 ENCOUNTER — Inpatient Hospital Stay (HOSPITAL_BASED_OUTPATIENT_CLINIC_OR_DEPARTMENT_OTHER): Payer: Medicaid Other | Admitting: Hospice and Palliative Medicine

## 2020-05-11 DIAGNOSIS — Z515 Encounter for palliative care: Secondary | ICD-10-CM

## 2020-05-11 NOTE — Progress Notes (Signed)
Unable to reach patient for scheduled MyChart visit.  Voicemail left.  Will reschedule.

## 2020-05-21 ENCOUNTER — Inpatient Hospital Stay: Payer: Medicaid Other

## 2020-05-21 ENCOUNTER — Other Ambulatory Visit: Payer: Self-pay | Admitting: *Deleted

## 2020-05-21 ENCOUNTER — Inpatient Hospital Stay: Payer: Medicaid Other | Attending: Oncology

## 2020-05-21 ENCOUNTER — Encounter: Payer: Self-pay | Admitting: Oncology

## 2020-05-21 ENCOUNTER — Inpatient Hospital Stay (HOSPITAL_BASED_OUTPATIENT_CLINIC_OR_DEPARTMENT_OTHER): Payer: Medicaid Other | Admitting: Oncology

## 2020-05-21 VITALS — Temp 97.3°F | Wt 190.3 lb

## 2020-05-21 VITALS — BP 145/79 | HR 63 | Resp 16

## 2020-05-21 DIAGNOSIS — C3432 Malignant neoplasm of lower lobe, left bronchus or lung: Secondary | ICD-10-CM

## 2020-05-21 DIAGNOSIS — R0602 Shortness of breath: Secondary | ICD-10-CM | POA: Diagnosis not present

## 2020-05-21 DIAGNOSIS — Z79899 Other long term (current) drug therapy: Secondary | ICD-10-CM | POA: Diagnosis not present

## 2020-05-21 DIAGNOSIS — E785 Hyperlipidemia, unspecified: Secondary | ICD-10-CM | POA: Insufficient documentation

## 2020-05-21 DIAGNOSIS — R059 Cough, unspecified: Secondary | ICD-10-CM | POA: Diagnosis not present

## 2020-05-21 DIAGNOSIS — T7840XA Allergy, unspecified, initial encounter: Secondary | ICD-10-CM | POA: Insufficient documentation

## 2020-05-21 DIAGNOSIS — I10 Essential (primary) hypertension: Secondary | ICD-10-CM | POA: Insufficient documentation

## 2020-05-21 DIAGNOSIS — Z87891 Personal history of nicotine dependence: Secondary | ICD-10-CM | POA: Diagnosis not present

## 2020-05-21 DIAGNOSIS — Z5112 Encounter for antineoplastic immunotherapy: Secondary | ICD-10-CM | POA: Diagnosis present

## 2020-05-21 LAB — CBC WITH DIFFERENTIAL/PLATELET
Abs Immature Granulocytes: 0.01 10*3/uL (ref 0.00–0.07)
Basophils Absolute: 0 10*3/uL (ref 0.0–0.1)
Basophils Relative: 0 %
Eosinophils Absolute: 0.1 10*3/uL (ref 0.0–0.5)
Eosinophils Relative: 2 %
HCT: 39.4 % (ref 39.0–52.0)
Hemoglobin: 12.5 g/dL — ABNORMAL LOW (ref 13.0–17.0)
Immature Granulocytes: 0 %
Lymphocytes Relative: 18 %
Lymphs Abs: 1 10*3/uL (ref 0.7–4.0)
MCH: 25.1 pg — ABNORMAL LOW (ref 26.0–34.0)
MCHC: 31.7 g/dL (ref 30.0–36.0)
MCV: 79 fL — ABNORMAL LOW (ref 80.0–100.0)
Monocytes Absolute: 0.5 10*3/uL (ref 0.1–1.0)
Monocytes Relative: 10 %
Neutro Abs: 3.8 10*3/uL (ref 1.7–7.7)
Neutrophils Relative %: 70 %
Platelets: 259 10*3/uL (ref 150–400)
RBC: 4.99 MIL/uL (ref 4.22–5.81)
RDW: 18.7 % — ABNORMAL HIGH (ref 11.5–15.5)
WBC: 5.4 10*3/uL (ref 4.0–10.5)
nRBC: 0 % (ref 0.0–0.2)

## 2020-05-21 LAB — COMPREHENSIVE METABOLIC PANEL
ALT: 9 U/L (ref 0–44)
AST: 18 U/L (ref 15–41)
Albumin: 3.7 g/dL (ref 3.5–5.0)
Alkaline Phosphatase: 65 U/L (ref 38–126)
Anion gap: 8 (ref 5–15)
BUN: 14 mg/dL (ref 8–23)
CO2: 25 mmol/L (ref 22–32)
Calcium: 9 mg/dL (ref 8.9–10.3)
Chloride: 110 mmol/L (ref 98–111)
Creatinine, Ser: 1 mg/dL (ref 0.61–1.24)
GFR, Estimated: >60 mL/min (ref >60)
Glucose, Bld: 119 mg/dL — ABNORMAL HIGH (ref 70–99)
Potassium: 3.3 mmol/L — ABNORMAL LOW (ref 3.5–5.1)
Sodium: 143 mmol/L (ref 135–145)
Total Bilirubin: 0.9 mg/dL (ref 0.3–1.2)
Total Protein: 7.2 g/dL (ref 6.5–8.1)

## 2020-05-21 MED ORDER — HEPARIN SOD (PORK) LOCK FLUSH 100 UNIT/ML IV SOLN
INTRAVENOUS | Status: AC
  Filled 2020-05-21: qty 5

## 2020-05-21 MED ORDER — HEPARIN SOD (PORK) LOCK FLUSH 100 UNIT/ML IV SOLN
500.0000 [IU] | Freq: Once | INTRAVENOUS | Status: AC | PRN
  Administered 2020-05-21: 500 [IU]
  Filled 2020-05-21: qty 5

## 2020-05-21 MED ORDER — SODIUM CHLORIDE 0.9 % IV SOLN
10.0000 mg/kg | Freq: Once | INTRAVENOUS | Status: AC
  Administered 2020-05-21: 860 mg via INTRAVENOUS
  Filled 2020-05-21: qty 10

## 2020-05-21 MED ORDER — SODIUM CHLORIDE 0.9% FLUSH
10.0000 mL | INTRAVENOUS | Status: DC | PRN
  Filled 2020-05-21: qty 10

## 2020-05-21 MED ORDER — SODIUM CHLORIDE 0.9 % IV SOLN
Freq: Once | INTRAVENOUS | Status: AC
  Filled 2020-05-21: qty 250

## 2020-05-21 NOTE — Progress Notes (Signed)
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 He underwent CT angios chest which did not show any PE. Soft tissue attenuation measuring 2.9 x 4 cm centered in the left hilum resulting in abrupt angulation and narrowing of the pulmonary arteries and the central left lower lobe airways. Additional ipsilateral hilar and subcarinal adenopathy. No contralateral adenopathy. Consolidative masslike opacity 3.6 x 3.1 cm in size and contiguous with more central perihilar soft tissue attenuation. Overall findings concerning for primary bronchogenic carcinoma.  Patient lives with his sister who is his main caregiver. He does not drive but is independent of his ADLs. Reports ongoing fatigue and occasional retrosternal chest pain. He has been evaluated by GI in the past for reflux as well. Appetieis fair and weight is stable.Denies any significant shortness of breath at this time.Patient is an ex-smoker and smoked for about 20 years but quit smoking back in 1994  PET CT scan showed hypermetabolic mass in the left lower lobe with mediastinal adenopathy and right paratracheal adenopathy. No evidence of distant metastatic disease. Pathology was consistent with non-small cell lung cancer favor adenocarcinoma. Cells were positive for TTF-1 and negative  for p40.  Patient started concurrent carbotaxol radiation and then had allergic reaction to Taxol for which she was switched to Abraxane. Given the short supply of Abraxane patient received carbo Alimta midway through his concurrent chemoradiation.Scans post chemoradiation showed partial response and patient will be started on maintenance durvalumabon 01/14/2020  Interval history-patient reports doing well and denies any complaints presently.  Appetite and weight are stable.  Denies any cough or shortness of breath.  ECOG PS- 1 Pain scale- 0   Review of systems- Review of Systems  Constitutional: Negative for chills, fever, malaise/fatigue and weight loss.  HENT: Negative for congestion, ear discharge and nosebleeds.   Eyes: Negative for blurred vision.  Respiratory: Negative for cough, hemoptysis, sputum production, shortness of breath and wheezing.   Cardiovascular: Negative for chest pain, palpitations, orthopnea and claudication.  Gastrointestinal: Negative for abdominal pain, blood in stool, constipation, diarrhea, heartburn, melena, nausea and vomiting.  Genitourinary: Negative for dysuria, flank pain, frequency, hematuria and urgency.  Musculoskeletal: Negative for back pain, joint pain and myalgias.  Skin: Negative for rash.  Neurological: Negative for dizziness, tingling, focal weakness, seizures, weakness and headaches.  Endo/Heme/Allergies: Does not bruise/bleed easily.  Psychiatric/Behavioral: Negative for depression and suicidal ideas. The patient does not have insomnia.       Allergies  Allergen Reactions  . Taxol [Paclitaxel] Other (See Comments)    Chest pain, hip pain, coughing , wheezing     Past Medical History:  Diagnosis Date  . Cancer ([place])   . Dyspnea   . Hyperlipidemia   . Hypertension      Past Surgical History:  Procedure Laterality Date  . PORTA CATH INSERTION N/A 10/22/2019   Procedure: PORTA CATH INSERTION;  Surgeon: Katha Cabal, MD;  Location: [place] [facility];  Service: Cardiovascular;  Laterality: N/A;  . VIDEO BRONCHOSCOPY WITH ENDOBRONCHIAL ULTRASOUND N/A 09/25/2019   Procedure: VIDEO BRONCHOSCOPY WITH ENDOBRONCHIAL ULTRASOUND;  Surgeon: Tyler Pita, MD;  Location: [facility];  Service: Pulmonary;  Laterality: N/A;    Social History   Socioeconomic History  . Marital status: Single    Spouse name: Not on file  . Number of children: Not on file  . Years of education: Not on file  . Highest education level: Not on file  Occupational History  . Not on file  Tobacco Use  . Smoking status: Former Smoker    Packs/day: 1.00    Years: 20.00    Pack years: 20.00    Types: Cigarettes    Quit date: 1994    Years since quitting: 28.2  . Smokeless tobacco: Never Used  Vaping Use  . Vaping Use: Never used  Substance and Sexual Activity  . Alcohol use: Yes    Comment: 2 beers a month  . Drug use: Never  . Sexual activity: Not on file  Other Topics Concern  . Not on file  Social History Narrative  . Not on file   Social Determinants of Health   Financial Resource Strain: Not on file  Food Insecurity: Not on file  Transportation Needs: Not on file  Physical Activity: Not on file  Stress: Not on file  Social Connections: Not on file  Intimate Partner Violence: Not on file    Family History  Problem Relation Age of Onset  . Cancer Brother      Current Outpatient Medications:  .  acetaminophen (TYLENOL) 500 MG tablet, Take 1,000 mg by mouth every 6 (six) hours as needed for mild pain, moderate pain or headache., Disp: , Rfl:  .  albuterol (VENTOLIN HFA) 108 (90 Base) MCG/ACT inhaler, Inhale 2 puffs into the lungs every 4 (four) hours as needed for wheezing or shortness of breath., Disp: 1 each, Rfl: 2 .  ANORO ELLIPTA 62.5-25 MCG/INH AEPB, Inhale 1 puff into the lungs daily., Disp: 1 each, Rfl: 2 .  benzonatate (TESSALON PERLES) 100 MG capsule, Take 1 capsule (100 mg total) by mouth every 6 (six)  hours as needed for cough., Disp: 90 capsule, Rfl: 0 .  famotidine (PEPCID) 20 MG tablet, TAKE 1 TABLET(20 MG) BY MOUTH TWICE DAILY, Disp: 60 tablet, Rfl: 1 .  folic acid (FOLVITE) 1 MG tablet, Take 1 tablet (1 mg total) by mouth daily., Disp: 30 tablet, Rfl: 3 .  fentaNYL (DURAGESIC) 12 MCG/HR, Place 1 patch onto the skin every 3 (three) days. (Patient not taking: No sig reported), Disp: 10 patch, Rfl: 0 .  gabapentin (NEURONTIN) 100 MG capsule, TAKE 1 CAPSULE(100 MG) BY MOUTH THREE TIMES DAILY (Patient not taking: No sig reported), Disp: 90 capsule, Rfl: 0 .  lidocaine (XYLOCAINE) 2 % solution, Use as directed 2 mLs in the mouth or throat. (Patient not taking: No sig reported), Disp: , Rfl:  .  lidocaine-prilocaine (EMLA) cream, Apply 1 application topically once. (Patient not taking: Reported on 05/21/2020), Disp: , Rfl:  .  oxyCODONE (OXY IR/ROXICODONE) 5 MG immediate release tablet, Take 2 tablets (10 mg total) by mouth every 4 (four) hours as needed for severe pain. (Patient not taking: No sig reported), Disp: 90 tablet, Rfl: 0 .  pantoprazole (PROTONIX) 40 MG tablet, Take 1 tablet (40 mg total) by mouth daily. (Patient not taking: No sig reported), Disp: 30 tablet, Rfl: 3 .  potassium  chloride (KLOR-CON) 20 MEQ packet, Take 20 mEq by mouth daily. (Patient not taking: Reported on 05/21/2020), Disp: 10 packet, Rfl: 0 .  sucralfate (CARAFATE) 1 g tablet, DISSOLVE 1 TABLET IN 3 TO 4 TABLESPOONS OF WARM WATER, THEN SWISH AND SWALLOW BY MOUTH THREE TIMES DAILY (Patient not taking: No sig reported), Disp: 90 tablet, Rfl: 1 .  traZODone (DESYREL) 50 MG tablet, TAKE 1 TABLET(50 MG) BY MOUTH AT BEDTIME (Patient not taking: No sig reported), Disp: 30 tablet, Rfl: 1 No current facility-administered medications for this visit.  Facility-Administered Medications Ordered in Other Visits:  .  sodium chloride flush (NS) 0.9 % injection 10 mL, 10 mL, Intracatheter, PRN, Sindy Guadeloupe, MD  Physical exam:  Vitals:    05/21/20 0924  Temp: (!) 97.3 F (36.3 C)  TempSrc: Tympanic  Weight: 190 lb 4.8 oz (86.3 kg)   Physical Exam Constitutional:      General: He is not in acute distress. Cardiovascular:     Rate and Rhythm: Normal rate and regular rhythm.     Heart sounds: Normal heart sounds.  Pulmonary:     Effort: Pulmonary effort is normal.     Breath sounds: Normal breath sounds.  Skin:    General: Skin is warm and dry.  Neurological:     Mental Status: He is alert and oriented to person, place, and time.      CMP Latest Ref Rng & Units 05/21/2020  Glucose 70 - 99 mg/dL 119(H)  BUN 8 - 23 mg/dL 14  Creatinine 0.61 - 1.24 mg/dL 1.00  Sodium 135 - 145 mmol/L 143  Potassium 3.5 - 5.1 mmol/L 3.3(L)  Chloride 98 - 111 mmol/L 110  CO2 22 - 32 mmol/L 25  Calcium 8.9 - 10.3 mg/dL 9.0  Total Protein 6.5 - 8.1 g/dL 7.2  Total Bilirubin 0.3 - 1.2 mg/dL 0.9  Alkaline Phos 38 - 126 U/L 65  AST 15 - 41 U/L 18  ALT 0 - 44 U/L 9   CBC Latest Ref Rng & Units 05/21/2020  WBC 4.0 - 10.5 K/uL 5.4  Hemoglobin 13.0 - 17.0 g/dL 12.5(L)  Hematocrit 39.0 - 52.0 % 39.4  Platelets 150 - 400 K/uL 259     Assessment and plan- Patient is a 70 y.o. 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 He is here for on treatment assessment prior to cycle 9 of maintenance durvalumab  Counts okay to proceed with cycle 9 of maintenance durvalumab today.  I will see him back in 2 weeks for cycle 10.  We will repeat scans next month.  Also add TSH to his next set of labs. Overall tolerating treatment well without any significant side effects.   Visit Diagnosis 1. Encounter for antineoplastic immunotherapy   2. Malignant neoplasm of lower lobe of left lung ([place])      Dr. Randa Evens, MD, MPH [facility] at [facility] [phone] 05/21/2020 3:07 PM

## 2020-06-04 ENCOUNTER — Inpatient Hospital Stay (HOSPITAL_BASED_OUTPATIENT_CLINIC_OR_DEPARTMENT_OTHER): Payer: Medicaid Other | Admitting: Oncology

## 2020-06-04 ENCOUNTER — Other Ambulatory Visit: Payer: Self-pay

## 2020-06-04 ENCOUNTER — Inpatient Hospital Stay: Payer: Medicaid Other

## 2020-06-04 ENCOUNTER — Encounter: Payer: Self-pay | Admitting: Oncology

## 2020-06-04 VITALS — BP 150/77 | HR 57 | Resp 18

## 2020-06-04 VITALS — BP 140/85 | HR 65 | Temp 96.6°F | Resp 20 | Wt 194.7 lb

## 2020-06-04 DIAGNOSIS — C3432 Malignant neoplasm of lower lobe, left bronchus or lung: Secondary | ICD-10-CM

## 2020-06-04 DIAGNOSIS — Z5112 Encounter for antineoplastic immunotherapy: Secondary | ICD-10-CM | POA: Diagnosis not present

## 2020-06-04 LAB — COMPREHENSIVE METABOLIC PANEL
ALT: 11 U/L (ref 0–44)
AST: 18 U/L (ref 15–41)
Albumin: 3.6 g/dL (ref 3.5–5.0)
Alkaline Phosphatase: 60 U/L (ref 38–126)
Anion gap: 7 (ref 5–15)
BUN: 15 mg/dL (ref 8–23)
CO2: 23 mmol/L (ref 22–32)
Calcium: 8.9 mg/dL (ref 8.9–10.3)
Chloride: 110 mmol/L (ref 98–111)
Creatinine, Ser: 0.95 mg/dL (ref 0.61–1.24)
GFR, Estimated: >60 mL/min (ref >60)
Glucose, Bld: 112 mg/dL — ABNORMAL HIGH (ref 70–99)
Potassium: 3.6 mmol/L (ref 3.5–5.1)
Sodium: 140 mmol/L (ref 135–145)
Total Bilirubin: 0.7 mg/dL (ref 0.3–1.2)
Total Protein: 6.9 g/dL (ref 6.5–8.1)

## 2020-06-04 LAB — CBC WITH DIFFERENTIAL/PLATELET
Abs Immature Granulocytes: 0.01 10*3/uL (ref 0.00–0.07)
Basophils Absolute: 0 10*3/uL (ref 0.0–0.1)
Basophils Relative: 0 %
Eosinophils Absolute: 0.1 10*3/uL (ref 0.0–0.5)
Eosinophils Relative: 1 %
HCT: 39.3 % (ref 39.0–52.0)
Hemoglobin: 12.6 g/dL — ABNORMAL LOW (ref 13.0–17.0)
Immature Granulocytes: 0 %
Lymphocytes Relative: 21 %
Lymphs Abs: 1.1 10*3/uL (ref 0.7–4.0)
MCH: 25.5 pg — ABNORMAL LOW (ref 26.0–34.0)
MCHC: 32.1 g/dL (ref 30.0–36.0)
MCV: 79.4 fL — ABNORMAL LOW (ref 80.0–100.0)
Monocytes Absolute: 0.5 10*3/uL (ref 0.1–1.0)
Monocytes Relative: 10 %
Neutro Abs: 3.4 10*3/uL (ref 1.7–7.7)
Neutrophils Relative %: 68 %
Platelets: 239 10*3/uL (ref 150–400)
RBC: 4.95 MIL/uL (ref 4.22–5.81)
RDW: 19.1 % — ABNORMAL HIGH (ref 11.5–15.5)
WBC: 5 10*3/uL (ref 4.0–10.5)
nRBC: 0 % (ref 0.0–0.2)

## 2020-06-04 LAB — TSH: TSH: 1.434 u[IU]/mL (ref 0.350–4.500)

## 2020-06-04 MED ORDER — SODIUM CHLORIDE 0.9 % IV SOLN
10.0000 mg/kg | Freq: Once | INTRAVENOUS | Status: AC
  Administered 2020-06-04: 860 mg via INTRAVENOUS
  Filled 2020-06-04: qty 10

## 2020-06-04 MED ORDER — HEPARIN SOD (PORK) LOCK FLUSH 100 UNIT/ML IV SOLN
500.0000 [IU] | Freq: Once | INTRAVENOUS | Status: AC | PRN
  Administered 2020-06-04: 500 [IU]
  Filled 2020-06-04: qty 5

## 2020-06-04 MED ORDER — HEPARIN SOD (PORK) LOCK FLUSH 100 UNIT/ML IV SOLN
INTRAVENOUS | Status: AC
  Filled 2020-06-04: qty 5

## 2020-06-04 MED ORDER — SODIUM CHLORIDE 0.9 % IV SOLN
Freq: Once | INTRAVENOUS | Status: AC
  Filled 2020-06-04: qty 250

## 2020-06-04 NOTE — Progress Notes (Signed)
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 He underwent CT angios chest which did not show any PE. Soft tissue attenuation measuring 2.9 x 4 cm centered in the left hilum resulting in abrupt angulation and narrowing of the pulmonary arteries and the central left lower lobe airways. Additional ipsilateral hilar and subcarinal adenopathy. No contralateral adenopathy. Consolidative masslike opacity 3.6 x 3.1 cm in size and contiguous with more central perihilar soft tissue attenuation. Overall findings concerning for primary bronchogenic carcinoma.  Patient lives with his sister who is his main caregiver. He does not drive but is independent of his ADLs. Reports ongoing fatigue and occasional retrosternal chest pain. He has been evaluated by GI in the past for reflux as well. Appetieis fair and weight is stable.Denies any significant shortness of breath at this time.Patient is an ex-smoker and smoked for about 20 years but quit smoking back in 1994  PET CT scan showed hypermetabolic mass in the left lower lobe with mediastinal adenopathy and right paratracheal adenopathy. No evidence of distant metastatic disease. Pathology was consistent with non-small cell lung cancer favor adenocarcinoma. Cells were positive for TTF-1 and negative  for p40.  Patient started concurrent carbotaxol radiation and then had allergic reaction to Taxol for which she was switched to Abraxane. Given the short supply of Abraxane patient received carbo Alimta midway through his concurrent chemoradiation.Scans post chemoradiation showed partial response and patient will be started on maintenance durvalumabon 01/14/2020  Interval history-patient reports that he has nocturnal itching especially over his bilateral upper extremities.  Denies any recent change in any soaps or lotions.  Denies any insects or bugs at home.  ECOG PS- 1 Pain scale- 0 Opioid associated constipation- no  Review of systems- Review of Systems  Constitutional: Negative for chills, fever, malaise/fatigue and weight loss.  HENT: Negative for congestion, ear discharge and nosebleeds.   Eyes: Negative for blurred vision.  Respiratory: Negative for cough, hemoptysis, sputum production, shortness of breath and wheezing.   Cardiovascular: Negative for chest pain, palpitations, orthopnea and claudication.  Gastrointestinal: Negative for abdominal pain, blood in stool, constipation, diarrhea, heartburn, melena, nausea and vomiting.  Genitourinary: Negative for dysuria, flank pain, frequency, hematuria and urgency.  Musculoskeletal: Negative for back pain, joint pain and myalgias.  Skin: Positive for itching. Negative for rash.  Neurological: Negative for dizziness, tingling, focal weakness, seizures, weakness and headaches.  Endo/Heme/Allergies: Does not bruise/bleed easily.  Psychiatric/Behavioral: Negative for depression and suicidal ideas. The patient does not have insomnia.        Allergies  Allergen Reactions  . Taxol [Paclitaxel] Other (See Comments)    Chest pain, hip pain, coughing , wheezing     Past Medical History:  Diagnosis Date  . Cancer ([place])   . Dyspnea   . Hyperlipidemia   . Hypertension      Past Surgical History:  Procedure Laterality Date  .  PORTA CATH INSERTION  N/A 10/22/2019   Procedure: PORTA CATH INSERTION;  Surgeon: Katha Cabal, MD;  Location: [place];  Service: Cardiovascular;  Laterality: N/A;  . VIDEO BRONCHOSCOPY WITH ENDOBRONCHIAL ULTRASOUND N/A 09/25/2019   Procedure: VIDEO BRONCHOSCOPY WITH ENDOBRONCHIAL ULTRASOUND;  Surgeon: Tyler Pita, MD;  Location: [facility];  Service: Pulmonary;  Laterality: N/A;    Social History   Socioeconomic History  . Marital status: Single    Spouse name: Not on file  . Number of children: Not on file  . Years of education: Not on file  . Highest education level: Not on file  Occupational History  . Not on file  Tobacco Use  . Smoking status: Former Smoker    Packs/day: 1.00    Years: 20.00    Pack years: 20.00    Types: Cigarettes    Quit date: 1994    Years since quitting: 28.3  . Smokeless tobacco: Never Used  Vaping Use  . Vaping Use: Never used  Substance and Sexual Activity  . Alcohol use: Yes    Comment: 2 beers a month  . Drug use: Never  . Sexual activity: Not on file  Other Topics Concern  . Not on file  Social History Narrative  . Not on file   Social Determinants of Health   Financial Resource Strain: Not on file  Food Insecurity: Not on file  Transportation Needs: Not on file  Physical Activity: Not on file  Stress: Not on file  Social Connections: Not on file  Intimate Partner Violence: Not on file    Family History  Problem Relation Age of Onset  . Cancer Brother      Current Outpatient Medications:  .  acetaminophen (TYLENOL) 500 MG tablet, Take 1,000 mg by mouth every 6 (six) hours as needed for mild pain, moderate pain or headache., Disp: , Rfl:  .  albuterol (VENTOLIN HFA) 108 (90 Base) MCG/ACT inhaler, Inhale 2 puffs into the lungs every 4 (four) hours as needed for wheezing or shortness of breath., Disp: 1 each, Rfl: 2 .  ANORO ELLIPTA 62.5-25 MCG/INH AEPB, Inhale 1 puff into the lungs daily., Disp: 1 each, Rfl:  2 .  benzonatate (TESSALON PERLES) 100 MG capsule, Take 1 capsule (100 mg total) by mouth every 6 (six) hours as needed for cough., Disp: 90 capsule, Rfl: 0 .  famotidine (PEPCID) 20 MG tablet, TAKE 1 TABLET(20 MG) BY MOUTH TWICE DAILY, Disp: 60 tablet, Rfl: 1 .  fentaNYL (DURAGESIC) 12 MCG/HR, Place 1 patch onto the skin every 3 (three) days. (Patient not taking: No sig reported), Disp: 10 patch, Rfl: 0 .  folic acid (FOLVITE) 1 MG tablet, Take 1 tablet (1 mg total) by mouth daily., Disp: 30 tablet, Rfl: 3 .  gabapentin (NEURONTIN) 100 MG capsule, TAKE 1 CAPSULE(100 MG) BY MOUTH THREE TIMES DAILY (Patient not taking: No sig reported), Disp: 90 capsule, Rfl: 0 .  lidocaine (XYLOCAINE) 2 % solution, Use as directed 2 mLs in the mouth or throat. (Patient not taking: No sig reported), Disp: , Rfl:  .  lidocaine-prilocaine (EMLA) cream, Apply 1 application topically once. (Patient not taking: Reported on 05/21/2020), Disp: , Rfl:  .  oxyCODONE (OXY IR/ROXICODONE) 5 MG immediate release tablet, Take 2 tablets (10 mg total) by mouth every 4 (four) hours as needed for severe pain. (Patient not taking: No sig reported), Disp: 90 tablet, Rfl: 0 .  pantoprazole (PROTONIX) 40 MG tablet, Take 1 tablet (40 mg total) by mouth  daily. (Patient not taking: No sig reported), Disp: 30 tablet, Rfl: 3 .  potassium chloride (KLOR-CON) 20 MEQ packet, Take 20 mEq by mouth daily. (Patient not taking: Reported on 05/21/2020), Disp: 10 packet, Rfl: 0 .  sucralfate (CARAFATE) 1 g tablet, DISSOLVE 1 TABLET IN 3 TO 4 TABLESPOONS OF WARM WATER, THEN SWISH AND SWALLOW BY MOUTH THREE TIMES DAILY (Patient not taking: No sig reported), Disp: 90 tablet, Rfl: 1 .  traZODone (DESYREL) 50 MG tablet, TAKE 1 TABLET(50 MG) BY MOUTH AT BEDTIME (Patient not taking: No sig reported), Disp: 30 tablet, Rfl: 1  Physical exam:  Vitals:   06/04/20 0928  BP: 140/85  Pulse: 65  Resp: 20  Temp: (!) 96.6 F (35.9 C)  TempSrc: Tympanic  SpO2: 90%   Weight: 194 lb 11.2 oz (88.3 kg)   Physical Exam Cardiovascular:     Rate and Rhythm: Normal rate and regular rhythm.     Heart sounds: Normal heart sounds.  Pulmonary:     Effort: Pulmonary effort is normal.     Breath sounds: Normal breath sounds.  Abdominal:     General: Bowel sounds are normal.     Palpations: Abdomen is soft.  Skin:    General: Skin is warm and dry.     Comments: Scratch marks seen over bilateral upper extremities  Neurological:     Mental Status: He is alert and oriented to person, place, and time.      CMP Latest Ref Rng & Units 06/04/2020  Glucose 70 - 99 mg/dL 112(H)  BUN 8 - 23 mg/dL 15  Creatinine 0.61 - 1.24 mg/dL 0.95  Sodium 135 - 145 mmol/L 140  Potassium 3.5 - 5.1 mmol/L 3.6  Chloride 98 - 111 mmol/L 110  CO2 22 - 32 mmol/L 23  Calcium 8.9 - 10.3 mg/dL 8.9  Total Protein 6.5 - 8.1 g/dL 6.9  Total Bilirubin 0.3 - 1.2 mg/dL 0.7  Alkaline Phos 38 - 126 U/L 60  AST 15 - 41 U/L 18  ALT 0 - 44 U/L 11   CBC Latest Ref Rng & Units 06/04/2020  WBC 4.0 - 10.5 K/uL 5.0  Hemoglobin 13.0 - 17.0 g/dL 12.6(L)  Hematocrit 39.0 - 52.0 % 39.3  Platelets 150 - 400 K/uL 239     Assessment and plan- Patient is a 70 y.o. male  withstage IIIBadenocarcinoma of the left lung cT2 cN3 cM0.  He is here for on treatment assessment prior to cycle 10 of maintenance durvalumab  Counts okay to proceed with cycle 10 of maintenance durvalumab today.  I will see him back in 2 weeks for cycle 11.  He will need repeat scans in 4 to 6 weeks.    Pruritus: Etiology unclear.  He had similar symptoms in the past which resolved for about 2 or 3 months.  I asked him to take as needed Benadryl at this time.  This does not appear to be a drug-induced rash   Visit Diagnosis 1. Malignant neoplasm of lower lobe of left lung ([place])   2. Encounter for antineoplastic immunotherapy      Dr. Randa Evens, MD, MPH [facility] [phone] 06/04/2020 2:42  PM

## 2020-06-18 ENCOUNTER — Inpatient Hospital Stay: Payer: Medicaid Other | Attending: Oncology

## 2020-06-18 ENCOUNTER — Encounter: Payer: Self-pay | Admitting: Oncology

## 2020-06-18 ENCOUNTER — Inpatient Hospital Stay (HOSPITAL_BASED_OUTPATIENT_CLINIC_OR_DEPARTMENT_OTHER): Payer: Medicaid Other | Admitting: Oncology

## 2020-06-18 ENCOUNTER — Inpatient Hospital Stay: Payer: Medicaid Other

## 2020-06-18 VITALS — BP 163/89 | HR 60 | Temp 97.6°F | Resp 20 | Wt 196.7 lb

## 2020-06-18 DIAGNOSIS — Z5112 Encounter for antineoplastic immunotherapy: Secondary | ICD-10-CM | POA: Diagnosis present

## 2020-06-18 DIAGNOSIS — I10 Essential (primary) hypertension: Secondary | ICD-10-CM | POA: Diagnosis not present

## 2020-06-18 DIAGNOSIS — E785 Hyperlipidemia, unspecified: Secondary | ICD-10-CM | POA: Insufficient documentation

## 2020-06-18 DIAGNOSIS — Z79899 Other long term (current) drug therapy: Secondary | ICD-10-CM | POA: Insufficient documentation

## 2020-06-18 DIAGNOSIS — C3432 Malignant neoplasm of lower lobe, left bronchus or lung: Secondary | ICD-10-CM | POA: Diagnosis not present

## 2020-06-18 DIAGNOSIS — Z87891 Personal history of nicotine dependence: Secondary | ICD-10-CM | POA: Insufficient documentation

## 2020-06-18 LAB — CBC WITH DIFFERENTIAL/PLATELET
Abs Immature Granulocytes: 0.01 10*3/uL (ref 0.00–0.07)
Basophils Absolute: 0 10*3/uL (ref 0.0–0.1)
Basophils Relative: 0 %
Eosinophils Absolute: 0.1 10*3/uL (ref 0.0–0.5)
Eosinophils Relative: 1 %
HCT: 40.1 % (ref 39.0–52.0)
Hemoglobin: 12.8 g/dL — ABNORMAL LOW (ref 13.0–17.0)
Immature Granulocytes: 0 %
Lymphocytes Relative: 21 %
Lymphs Abs: 1.2 10*3/uL (ref 0.7–4.0)
MCH: 25.6 pg — ABNORMAL LOW (ref 26.0–34.0)
MCHC: 31.9 g/dL (ref 30.0–36.0)
MCV: 80.2 fL (ref 80.0–100.0)
Monocytes Absolute: 0.4 10*3/uL (ref 0.1–1.0)
Monocytes Relative: 7 %
Neutro Abs: 4 10*3/uL (ref 1.7–7.7)
Neutrophils Relative %: 71 %
Platelets: 251 10*3/uL (ref 150–400)
RBC: 5 MIL/uL (ref 4.22–5.81)
RDW: 18.6 % — ABNORMAL HIGH (ref 11.5–15.5)
WBC: 5.7 10*3/uL (ref 4.0–10.5)
nRBC: 0 % (ref 0.0–0.2)

## 2020-06-18 LAB — COMPREHENSIVE METABOLIC PANEL
ALT: 10 U/L (ref 0–44)
AST: 19 U/L (ref 15–41)
Albumin: 3.7 g/dL (ref 3.5–5.0)
Alkaline Phosphatase: 58 U/L (ref 38–126)
Anion gap: 9 (ref 5–15)
BUN: 14 mg/dL (ref 8–23)
CO2: 22 mmol/L (ref 22–32)
Calcium: 8.8 mg/dL — ABNORMAL LOW (ref 8.9–10.3)
Chloride: 106 mmol/L (ref 98–111)
Creatinine, Ser: 1.1 mg/dL (ref 0.61–1.24)
GFR, Estimated: >60 mL/min (ref >60)
Glucose, Bld: 153 mg/dL — ABNORMAL HIGH (ref 70–99)
Potassium: 3.4 mmol/L — ABNORMAL LOW (ref 3.5–5.1)
Sodium: 137 mmol/L (ref 135–145)
Total Bilirubin: 0.7 mg/dL (ref 0.3–1.2)
Total Protein: 7.4 g/dL (ref 6.5–8.1)

## 2020-06-18 MED ORDER — SODIUM CHLORIDE 0.9 % IV SOLN
10.0000 mg/kg | Freq: Once | INTRAVENOUS | Status: AC
  Administered 2020-06-18: 860 mg via INTRAVENOUS
  Filled 2020-06-18: qty 10

## 2020-06-18 MED ORDER — HEPARIN SOD (PORK) LOCK FLUSH 100 UNIT/ML IV SOLN
500.0000 [IU] | Freq: Once | INTRAVENOUS | Status: AC | PRN
  Administered 2020-06-18: 500 [IU]
  Filled 2020-06-18: qty 5

## 2020-06-18 MED ORDER — SODIUM CHLORIDE 0.9 % IV SOLN
Freq: Once | INTRAVENOUS | Status: AC
  Filled 2020-06-18: qty 250

## 2020-06-18 NOTE — Patient Instructions (Signed)
[facility]  Discharge Instructions: Thank you for choosing [facility] to provide your oncology and hematology care.  If you have a lab appointment with the [facility], please go directly to the [facility] and check in at the registration area.  Wear comfortable clothing and clothing appropriate for easy access to any Portacath or PICC line.   We strive to give you quality time with your provider. You may need to reschedule your appointment if you arrive late (15 or more minutes).  Arriving late affects you and other patients whose appointments are after yours.  Also, if you miss three or more appointments without notifying the office, you may be dismissed from the clinic at the provider's discretion.      For prescription refill requests, have your pharmacy contact our office and allow 72 hours for refills to be completed.    Today you received the following chemotherapy and/or immunotherapy agents Durvalumab      To help prevent nausea and vomiting after your treatment, we encourage you to take your nausea medication as directed.  BELOW ARE SYMPTOMS THAT SHOULD BE REPORTED IMMEDIATELY: . *FEVER GREATER THAN 100.4 F (38 C) OR HIGHER . *CHILLS OR SWEATING . *NAUSEA AND VOMITING THAT IS NOT CONTROLLED WITH YOUR NAUSEA MEDICATION . *UNUSUAL SHORTNESS OF BREATH . *UNUSUAL BRUISING OR BLEEDING . *URINARY PROBLEMS (pain or burning when urinating, or frequent urination) . *BOWEL PROBLEMS (unusual diarrhea, constipation, pain near the anus) . TENDERNESS IN MOUTH AND THROAT WITH OR WITHOUT PRESENCE OF ULCERS (sore throat, sores in mouth, or a toothache) . UNUSUAL RASH, SWELLING OR PAIN  . UNUSUAL VAGINAL DISCHARGE OR ITCHING   Items with * indicate a potential emergency and should be followed up as soon as possible or go to the Emergency Department if any problems should occur.  Please show the CHEMOTHERAPY ALERT CARD or IMMUNOTHERAPY  ALERT CARD at check-in to the Emergency Department and triage nurse.  Should you have questions after your visit or need to cancel or reschedule your appointment, please contact [facility]  [phone] and follow the prompts.  Office hours are 8:00 a.m. to 4:30 p.m. Monday - Friday. Please note that voicemails left after 4:00 p.m. may not be returned until the following business day.  We are closed weekends and major holidays. You have access to a nurse at all times for urgent questions. Please call the main number to the clinic [phone] and follow the prompts.  For any non-urgent questions, you may also contact your provider using MyChart. We now offer e-Visits for anyone 46 and older to request care online for non-urgent symptoms. For details visit [email or web address].   Also download the MyChart app! Go to the app store, search "MyChart", open the app, select [facility], and log in with your MyChart username and password.  Due to Covid, a mask is required upon entering the hospital/clinic. If you do not have a mask, one will be given to you upon arrival. For doctor visits, patients may have 1 support person aged 52 or older with them. For treatment visits, patients cannot have anyone with them due to current Covid guidelines and our immunocompromised population. Durvalumab injection What is this medicine? DURVALUMAB (dur VAL ue mab) is a monoclonal antibody. It is used to treat lung cancer. This medicine may be used for other purposes; ask your health care provider or pharmacist if you have questions. COMMON BRAND NAME(S): [email or web address] What should  I tell my health care provider before I take this medicine? They need to know if you have any of these conditions:  autoimmune diseases like Crohn's disease, ulcerative colitis, or lupus  have had or planning to have an allogeneic stem cell transplant (uses someone else's stem cells)  history of organ  transplant  history of radiation to the chest  nervous system problems like myasthenia gravis or Guillain-Barre syndrome  an unusual or allergic reaction to durvalumab, other medicines, foods, dyes, or preservatives  pregnant or trying to get pregnant  breast-feeding How should I use this medicine? This medicine is for infusion into a vein. It is given by a health care professional in a hospital or clinic setting. A special MedGuide will be given to you before each treatment. Be sure to read this information carefully each time. Talk to your pediatrician regarding the use of this medicine in children. Special care may be needed. Overdosage: If you think you have taken too much of this medicine contact a poison control center or emergency room at once. NOTE: This medicine is only for you. Do not share this medicine with others. What if I miss a dose? It is important not to miss your dose. Call your doctor or health care professional if you are unable to keep an appointment. What may interact with this medicine? Interactions have not been studied. This list may not describe all possible interactions. Give your health care provider a list of all the medicines, herbs, non-prescription drugs, or dietary supplements you use. Also tell them if you smoke, drink alcohol, or use illegal drugs. Some items may interact with your medicine. What should I watch for while using this medicine? This drug may make you feel generally unwell. Continue your course of treatment even though you feel ill unless your doctor tells you to stop. You may need blood work done while you are taking this medicine. Do not become pregnant while taking this medicine or for 3 months after stopping it. Women should inform their doctor if they wish to become pregnant or think they might be pregnant. There is a potential for serious side effects to an unborn child. Talk to your health care professional or pharmacist for more  information. Do not breast-feed an infant while taking this medicine or for 3 months after stopping it. What side effects may I notice from receiving this medicine? Side effects that you should report to your doctor or health care professional as soon as possible:  allergic reactions like skin rash, itching or hives, swelling of the face, lips, or tongue  black, tarry stools  bloody or watery diarrhea  breathing problems  change in emotions or moods  change in sex drive  changes in vision  chest pain or chest tightness  chills  confusion  cough  facial flushing  fever  headache  signs and symptoms of high blood sugar such as dizziness; dry mouth; dry skin; fruity breath; nausea; stomach pain; increased hunger or thirst; increased urination  signs and symptoms of liver injury like dark yellow or brown urine; general ill feeling or flu-like symptoms; light-colored stools; loss of appetite; nausea; right upper belly pain; unusually weak or tired; yellowing of the eyes or skin  stomach pain  trouble passing urine or change in the amount of urine  weight gain or weight loss Side effects that usually do not require medical attention (report these to your doctor or health care professional if they continue or are bothersome):  bone  pain  constipation  loss of appetite  muscle pain  nausea  swelling of the ankles, feet, hands  tiredness This list may not describe all possible side effects. Call your doctor for medical advice about side effects. You may report side effects to FDA at 1-800-FDA-1088. Where should I keep my medicine? This drug is given in a hospital or clinic and will not be stored at home. NOTE: This sheet is a summary. It may not cover all possible information. If you have questions about this medicine, talk to your doctor, pharmacist, or health care provider.  2021 Elsevier/Gold Standard (2019-04-11 13:01:29)

## 2020-06-18 NOTE — Progress Notes (Signed)
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 He underwent CT angios chest which did not show any PE. Soft tissue attenuation measuring 2.9 x 4 cm centered in the left hilum resulting in abrupt angulation and narrowing of the pulmonary arteries and the central left lower lobe airways. Additional ipsilateral hilar and subcarinal adenopathy. No contralateral adenopathy. Consolidative masslike opacity 3.6 x 3.1 cm in size and contiguous with more central perihilar soft tissue attenuation. Overall findings concerning for primary bronchogenic carcinoma.  Patient lives with his sister who is his main caregiver. He does not drive but is independent of his ADLs. Reports ongoing fatigue and occasional retrosternal chest pain. He has been evaluated by GI in the past for reflux as well. Appetieis fair and weight is stable.Denies any significant shortness of breath at this time.Patient is an ex-smoker and smoked for about 20 years but quit smoking back in 1994  PET CT scan showed hypermetabolic mass in the left lower lobe with mediastinal adenopathy and right paratracheal adenopathy. No evidence of distant metastatic disease. Pathology was consistent with non-small cell lung cancer favor adenocarcinoma. Cells were positive for TTF-1 and  negative for p40.  Patient started concurrent carbotaxol radiation and then had allergic reaction to Taxol for which she was switched to Abraxane. Given the short supply of Abraxane patient received carbo Alimta midway through his concurrent chemoradiation.Scans post chemoradiation showed partial response and patient will be started on maintenance durvalumabon 01/14/2020   Interval history-generalized itching is improving with topical steroids.  Denies any new complaints at this time  ECOG PS- 1 Pain scale- 0   Review of systems- Review of Systems  Constitutional: Negative for chills, fever, malaise/fatigue and weight loss.  HENT: Negative for congestion, ear discharge and nosebleeds.   Eyes: Negative for blurred vision.  Respiratory: Negative for cough, hemoptysis, sputum production, shortness of breath and wheezing.   Cardiovascular: Negative for chest pain, palpitations, orthopnea and claudication.  Gastrointestinal: Negative for abdominal pain, blood in stool, constipation, diarrhea, heartburn, melena, nausea and vomiting.  Genitourinary: Negative for dysuria, flank pain, frequency, hematuria and urgency.  Musculoskeletal: Negative for back pain, joint pain and myalgias.  Skin: Positive for itching. Negative for rash.  Neurological: Negative for dizziness, tingling, focal weakness, seizures, weakness and headaches.  Endo/Heme/Allergies: Does not bruise/bleed easily.  Psychiatric/Behavioral: Negative for depression and suicidal ideas. The patient does not have insomnia.       Allergies  Allergen Reactions  . Taxol [Paclitaxel] Other (See Comments)    Chest pain, hip pain, coughing , wheezing     Past Medical History:  Diagnosis Date  . Cancer ([place])   . Dyspnea   . Hyperlipidemia   . Hypertension      Past Surgical History:  Procedure Laterality Date  . PORTA CATH INSERTION N/A 10/22/2019   Procedure: PORTA CATH INSERTION;  Surgeon: Katha Cabal, MD;   Location: [place]  CV LAB;  Service: Cardiovascular;  Laterality: N/A;  . VIDEO BRONCHOSCOPY WITH ENDOBRONCHIAL ULTRASOUND N/A 09/25/2019   Procedure: VIDEO BRONCHOSCOPY WITH ENDOBRONCHIAL ULTRASOUND;  Surgeon: Tyler Pita, MD;  Location: [facility] ORS;  Service: Pulmonary;  Laterality: N/A;    Social History   Socioeconomic History  . Marital status: Single    Spouse name: Not on file  . Number of children: Not on file  . Years of education: Not on file  . Highest education level: Not on file  Occupational History  . Not on file  Tobacco Use  . Smoking status: Former Smoker    Packs/day: 1.00    Years: 20.00    Pack years: 20.00    Types: Cigarettes    Quit date: 1994    Years since quitting: 28.3  . Smokeless tobacco: Never Used  Vaping Use  . Vaping Use: Never used  Substance and Sexual Activity  . Alcohol use: Yes    Comment: 2 beers a month  . Drug use: Never  . Sexual activity: Not on file  Other Topics Concern  . Not on file  Social History Narrative  . Not on file   Social Determinants of Health   Financial Resource Strain: Not on file  Food Insecurity: Not on file  Transportation Needs: Not on file  Physical Activity: Not on file  Stress: Not on file  Social Connections: Not on file  Intimate Partner Violence: Not on file    Family History  Problem Relation Age of Onset  . Cancer Brother      Current Outpatient Medications:  .  acetaminophen (TYLENOL) 500 MG tablet, Take 1,000 mg by mouth every 6 (six) hours as needed for mild pain, moderate pain or headache., Disp: , Rfl:  .  albuterol (VENTOLIN HFA) 108 (90 Base) MCG/ACT inhaler, Inhale 2 puffs into the lungs every 4 (four) hours as needed for wheezing or shortness of breath., Disp: 1 each, Rfl: 2 .  ANORO ELLIPTA 62.5-25 MCG/INH AEPB, Inhale 1 puff into the lungs daily., Disp: 1 each, Rfl: 2 .  benzonatate (TESSALON PERLES) 100 MG capsule, Take 1 capsule (100 mg total) by mouth every 6 (six)  hours as needed for cough., Disp: 90 capsule, Rfl: 0 .  famotidine (PEPCID) 20 MG tablet, TAKE 1 TABLET(20 MG) BY MOUTH TWICE DAILY, Disp: 60 tablet, Rfl: 1 .  fentaNYL (DURAGESIC) 12 MCG/HR, Place 1 patch onto the skin every 3 (three) days. (Patient not taking: No sig reported), Disp: 10 patch, Rfl: 0 .  folic acid (FOLVITE) 1 MG tablet, Take 1 tablet (1 mg total) by mouth daily., Disp: 30 tablet, Rfl: 3 .  gabapentin (NEURONTIN) 100 MG capsule, TAKE 1 CAPSULE(100 MG) BY MOUTH THREE TIMES DAILY (Patient not taking: No sig reported), Disp: 90 capsule, Rfl: 0 .  lidocaine (XYLOCAINE) 2 % solution, Use as directed 2 mLs in the mouth or throat. (Patient not taking: No sig reported), Disp: , Rfl:  .  lidocaine-prilocaine (EMLA) cream, Apply 1 application topically once. (Patient not taking: Reported on 05/21/2020), Disp: , Rfl:  .  oxyCODONE (OXY IR/ROXICODONE) 5 MG immediate release tablet, Take 2 tablets (10 mg total) by mouth every 4 (four) hours as needed for severe pain. (Patient not taking: No sig reported), Disp: 90 tablet, Rfl: 0 .  pantoprazole (PROTONIX) 40 MG tablet, Take 1 tablet (40 mg total) by mouth daily. (Patient not taking: No sig reported), Disp: 30 tablet, Rfl: 3 .  potassium chloride (KLOR-CON) 20  MEQ packet, Take 20 mEq by mouth daily. (Patient not taking: Reported on 05/21/2020), Disp: 10 packet, Rfl: 0 .  sucralfate (CARAFATE) 1 g tablet, DISSOLVE 1 TABLET IN 3 TO 4 TABLESPOONS OF WARM WATER, THEN SWISH AND SWALLOW BY MOUTH THREE TIMES DAILY (Patient not taking: No sig reported), Disp: 90 tablet, Rfl: 1 .  traZODone (DESYREL) 50 MG tablet, TAKE 1 TABLET(50 MG) BY MOUTH AT BEDTIME (Patient not taking: No sig reported), Disp: 30 tablet, Rfl: 1  Physical exam:  Vitals:   06/18/20 0848  BP: (!) 163/89  Pulse: 60  Resp: 20  Temp: 97.6 F (36.4 C)  TempSrc: Tympanic  SpO2: 97%  Weight: 196 lb 11.2 oz (89.2 kg)   Physical Exam Cardiovascular:     Rate and Rhythm: Normal rate and  regular rhythm.     Heart sounds: Normal heart sounds.  Pulmonary:     Effort: Pulmonary effort is normal.     Breath sounds: Normal breath sounds.  Abdominal:     General: Bowel sounds are normal.     Palpations: Abdomen is soft.  Skin:    General: Skin is warm and dry.  Neurological:     Mental Status: He is alert and oriented to person, place, and time.      CMP Latest Ref Rng & Units 06/04/2020  Glucose 70 - 99 mg/dL 112(H)  BUN 8 - 23 mg/dL 15  Creatinine 0.61 - 1.24 mg/dL 0.95  Sodium 135 - 145 mmol/L 140  Potassium 3.5 - 5.1 mmol/L 3.6  Chloride 98 - 111 mmol/L 110  CO2 22 - 32 mmol/L 23  Calcium 8.9 - 10.3 mg/dL 8.9  Total Protein 6.5 - 8.1 g/dL 6.9  Total Bilirubin 0.3 - 1.2 mg/dL 0.7  Alkaline Phos 38 - 126 U/L 60  AST 15 - 41 U/L 18  ALT 0 - 44 U/L 11   CBC Latest Ref Rng & Units 06/04/2020  WBC 4.0 - 10.5 K/uL 5.0  Hemoglobin 13.0 - 17.0 g/dL 12.6(L)  Hematocrit 39.0 - 52.0 % 39.3  Platelets 150 - 400 K/uL 239      Assessment and plan- Patient is a 70 y.o. male  withstage IIIBadenocarcinoma of the left lung cT2 cN3 cM0.  He is here for on treatment assessment prior to cycle 11 of maintenance durvalumab  Counts okay to proceed with cycle 11 of maintenance durvalumab today.  I will see him back in 2 weeks for cycle 12.  Plan to repeat scans after 13 cycles  Itching: Patient reports improvement in symptoms with topical clobetasol cream.  Continue to monitor   Visit Diagnosis 1. Encounter for antineoplastic immunotherapy   2. Malignant neoplasm of lower lobe of left lung ([place])      Dr. Randa Evens, MD, MPH [facility] at [facility] [phone] 06/18/2020 8:41 AM

## 2020-07-03 ENCOUNTER — Encounter: Payer: Self-pay | Admitting: Oncology

## 2020-07-03 ENCOUNTER — Inpatient Hospital Stay (HOSPITAL_BASED_OUTPATIENT_CLINIC_OR_DEPARTMENT_OTHER): Payer: Medicaid Other | Admitting: Oncology

## 2020-07-03 ENCOUNTER — Inpatient Hospital Stay: Payer: Medicaid Other

## 2020-07-03 ENCOUNTER — Other Ambulatory Visit: Payer: Self-pay | Admitting: *Deleted

## 2020-07-03 ENCOUNTER — Other Ambulatory Visit: Payer: Self-pay

## 2020-07-03 VITALS — BP 143/97 | HR 70 | Temp 98.0°F | Resp 16 | Ht 70.0 in | Wt 193.5 lb

## 2020-07-03 DIAGNOSIS — C3432 Malignant neoplasm of lower lobe, left bronchus or lung: Secondary | ICD-10-CM

## 2020-07-03 DIAGNOSIS — Z5112 Encounter for antineoplastic immunotherapy: Secondary | ICD-10-CM

## 2020-07-03 LAB — CBC WITH DIFFERENTIAL/PLATELET
Abs Immature Granulocytes: 0.01 10*3/uL (ref 0.00–0.07)
Basophils Absolute: 0 10*3/uL (ref 0.0–0.1)
Basophils Relative: 0 %
Eosinophils Absolute: 0.1 10*3/uL (ref 0.0–0.5)
Eosinophils Relative: 2 %
HCT: 40 % (ref 39.0–52.0)
Hemoglobin: 12.9 g/dL — ABNORMAL LOW (ref 13.0–17.0)
Immature Granulocytes: 0 %
Lymphocytes Relative: 22 %
Lymphs Abs: 1.2 10*3/uL (ref 0.7–4.0)
MCH: 25.7 pg — ABNORMAL LOW (ref 26.0–34.0)
MCHC: 32.3 g/dL (ref 30.0–36.0)
MCV: 79.8 fL — ABNORMAL LOW (ref 80.0–100.0)
Monocytes Absolute: 0.5 10*3/uL (ref 0.1–1.0)
Monocytes Relative: 9 %
Neutro Abs: 3.6 10*3/uL (ref 1.7–7.7)
Neutrophils Relative %: 67 %
Platelets: 254 10*3/uL (ref 150–400)
RBC: 5.01 MIL/uL (ref 4.22–5.81)
RDW: 18.1 % — ABNORMAL HIGH (ref 11.5–15.5)
WBC: 5.3 10*3/uL (ref 4.0–10.5)
nRBC: 0 % (ref 0.0–0.2)

## 2020-07-03 LAB — COMPREHENSIVE METABOLIC PANEL
ALT: 10 U/L (ref 0–44)
AST: 19 U/L (ref 15–41)
Albumin: 3.8 g/dL (ref 3.5–5.0)
Alkaline Phosphatase: 63 U/L (ref 38–126)
Anion gap: 9 (ref 5–15)
BUN: 12 mg/dL (ref 8–23)
CO2: 22 mmol/L (ref 22–32)
Calcium: 9.1 mg/dL (ref 8.9–10.3)
Chloride: 108 mmol/L (ref 98–111)
Creatinine, Ser: 1.01 mg/dL (ref 0.61–1.24)
GFR, Estimated: >60 mL/min (ref >60)
Glucose, Bld: 113 mg/dL — ABNORMAL HIGH (ref 70–99)
Potassium: 3.4 mmol/L — ABNORMAL LOW (ref 3.5–5.1)
Sodium: 139 mmol/L (ref 135–145)
Total Bilirubin: 0.9 mg/dL (ref 0.3–1.2)
Total Protein: 7.3 g/dL (ref 6.5–8.1)

## 2020-07-03 MED ORDER — SODIUM CHLORIDE 0.9 % IV SOLN
10.0000 mg/kg | Freq: Once | INTRAVENOUS | Status: AC
  Administered 2020-07-03: 860 mg via INTRAVENOUS
  Filled 2020-07-03: qty 7.2

## 2020-07-03 MED ORDER — ANORO ELLIPTA 62.5-25 MCG/INH IN AEPB: 1.0000 | INHALATION_SPRAY | Freq: Every day | RESPIRATORY_TRACT | 2 refills | Status: DC

## 2020-07-03 MED ORDER — HEPARIN SOD (PORK) LOCK FLUSH 100 UNIT/ML IV SOLN
500.0000 [IU] | Freq: Once | INTRAVENOUS | Status: AC | PRN
  Administered 2020-07-03: 500 [IU]
  Filled 2020-07-03: qty 5

## 2020-07-03 MED ORDER — ALBUTEROL SULFATE HFA 108 (90 BASE) MCG/ACT IN AERS: 2.0000 | INHALATION_SPRAY | RESPIRATORY_TRACT | 2 refills | Status: DC | PRN

## 2020-07-03 MED ORDER — SODIUM CHLORIDE 0.9 % IV SOLN
Freq: Once | INTRAVENOUS | Status: AC
  Filled 2020-07-03: qty 250

## 2020-07-03 MED ORDER — HEPARIN SOD (PORK) LOCK FLUSH 100 UNIT/ML IV SOLN
INTRAVENOUS | Status: AC
  Filled 2020-07-03: qty 5

## 2020-07-03 NOTE — Progress Notes (Signed)
Patient tolerated  Durvalumab infusion well today, no concerns voiced. Patient discharged. Stable.

## 2020-07-03 NOTE — Progress Notes (Signed)
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 He underwent CT angios chest which did not show any PE. Soft tissue attenuation measuring 2.9 x 4 cm centered in the left hilum resulting in abrupt angulation and narrowing of the pulmonary arteries and the central left lower lobe airways. Additional ipsilateral hilar and subcarinal adenopathy. No contralateral adenopathy. Consolidative masslike opacity 3.6 x 3.1 cm in size and contiguous with more central perihilar soft tissue attenuation. Overall findings concerning for primary bronchogenic carcinoma.  Patient lives with his sister who is his main caregiver. He does not drive but is independent of his ADLs. Reports ongoing fatigue and occasional retrosternal chest pain. He has been evaluated by GI in the past for reflux as well. Appetieis fair and weight is stable.Denies any significant shortness of breath at this time.Patient is an ex-smoker and smoked for about 20 years but quit smoking back in 1994  PET CT scan showed hypermetabolic mass in the left lower lobe with mediastinal adenopathy and right paratracheal adenopathy. No evidence of distant metastatic disease. Pathology was consistent with non-small cell lung cancer favor adenocarcinoma. Cells were positive for TTF-1 and negative  for p40.  Patient started concurrent carbotaxol radiation and then had allergic reaction to Taxol for which she was switched to Abraxane. Given the short supply of Abraxane patient received carbo Alimta midway through his concurrent chemoradiation.Scans post chemoradiation showed partial response and patient will be started on maintenance durvalumabon 01/14/2020   Interval history-patient reports nighttime shortness of breath and increased wheezing which improves with albuterol.  Daytime his breathing is better.  ECOG PS- 1 Pain scale- 0   Review of systems- Review of Systems  Constitutional: Negative for chills, fever, malaise/fatigue and weight loss.  HENT: Negative for congestion, ear discharge and nosebleeds.   Eyes: Negative for blurred vision.  Respiratory: Positive for wheezing. Negative for cough, hemoptysis, sputum production and shortness of breath.   Cardiovascular: Negative for chest pain, palpitations, orthopnea and claudication.  Gastrointestinal: Negative for abdominal pain, blood in stool, constipation, diarrhea, heartburn, melena, nausea and vomiting.  Genitourinary: Negative for dysuria, flank pain, frequency, hematuria and urgency.  Musculoskeletal: Negative for back pain, joint pain and myalgias.  Skin: Negative for rash.  Neurological: Negative for dizziness, tingling, focal weakness, seizures, weakness and headaches.  Endo/Heme/Allergies: Does not bruise/bleed easily.  Psychiatric/Behavioral: Negative for depression and suicidal ideas. The patient does not have insomnia.        Allergies  Allergen Reactions  . Taxol [Paclitaxel] Other (See Comments)    Chest pain, hip pain, coughing , wheezing     Past Medical History:  Diagnosis Date  . Cancer ([place])   . Dyspnea   . Hyperlipidemia   . Hypertension      Past Surgical History:  Procedure Laterality Date  . PORTA CATH INSERTION N/A 10/22/2019   Procedure: PORTA CATH INSERTION;  Surgeon: Katha Cabal, MD;  Location: [place] CV LAB;  Service: Cardiovascular;  Laterality: N/A;  . VIDEO BRONCHOSCOPY WITH ENDOBRONCHIAL ULTRASOUND N/A 09/25/2019   Procedure: VIDEO BRONCHOSCOPY WITH ENDOBRONCHIAL ULTRASOUND;  Surgeon: Tyler Pita, MD;  Location: [facility] ORS;  Service: Pulmonary;  Laterality: N/A;    Social History   Socioeconomic History  . Marital status: Single    Spouse name: Not on file  . Number of children: Not on file  . Years of education: Not on file  . Highest education level: Not on file  Occupational History  . Not on file  Tobacco Use  . Smoking status: Former Smoker    Packs/day: 1.00    Years: 20.00    Pack years: 20.00    Types: Cigarettes    Quit date: 1994    Years since quitting: 28.4  . Smokeless tobacco: Never Used  Vaping Use  . Vaping Use: Never used  Substance and Sexual Activity  . Alcohol use: Yes    Comment: 2 beers a month  . Drug use: Never  . Sexual activity: Not on file  Other Topics Concern  . Not on file  Social History Narrative  . Not on file   Social Determinants of Health   Financial Resource Strain: Not on file  Food Insecurity: Not on file  Transportation Needs: Not on file  Physical Activity: Not on file  Stress: Not on file  Social Connections: Not on file  Intimate Partner Violence: Not on file    Family History  Problem Relation Age of Onset  . Cancer Brother      Current Outpatient Medications:  .  acetaminophen (TYLENOL) 500 MG tablet, Take 1,000 mg by mouth every 6 (six) hours as needed for mild pain, moderate pain or headache., Disp: , Rfl:  .  albuterol (VENTOLIN HFA) 108 (90 Base) MCG/ACT inhaler, Inhale 2 puffs into the lungs every 4 (four) hours as needed for wheezing or shortness of breath., Disp: 1 each, Rfl: 2 .  ANORO ELLIPTA 62.5-25 MCG/INH AEPB, Inhale 1 puff into the lungs daily., Disp: 1 each, Rfl: 2 .  benzonatate (TESSALON PERLES) 100 MG capsule, Take 1 capsule (100 mg total) by  mouth every 6 (six) hours as needed for cough., Disp: 90 capsule, Rfl: 0 .  clobetasol cream (TEMOVATE) 0.05 %, Apply topically., Disp: , Rfl:  .  famotidine (PEPCID) 20 MG tablet, TAKE 1 TABLET(20 MG) BY MOUTH TWICE DAILY, Disp: 60 tablet, Rfl: 1 .  fentaNYL (DURAGESIC) 12 MCG/HR, Place 1 patch onto the skin every 3 (three) days. (Patient not taking: No sig reported), Disp: 10 patch, Rfl: 0 .  folic acid (FOLVITE) 1 MG tablet, Take 1 tablet (1 mg total) by mouth daily., Disp: 30 tablet, Rfl: 3 .  gabapentin (NEURONTIN) 100 MG capsule, TAKE 1 CAPSULE(100 MG) BY MOUTH THREE TIMES DAILY (Patient not taking: No sig reported), Disp: 90 capsule, Rfl: 0 .  hydrOXYzine (ATARAX/VISTARIL) 25 MG tablet, Take 25 mg by mouth daily., Disp: , Rfl:  .  lidocaine (XYLOCAINE) 2 % solution, Use as directed 2 mLs in the mouth or throat. (Patient not taking: No sig reported), Disp: , Rfl:  .  lidocaine-prilocaine (EMLA) cream, Apply 1 application topically once. (Patient not taking: No sig reported), Disp: , Rfl:  .  oxyCODONE (OXY IR/ROXICODONE) 5 MG immediate release tablet, Take 2 tablets (10 mg total) by mouth every 4 (four) hours as needed for severe pain. (Patient not taking: No sig reported), Disp: 90 tablet, Rfl: 0 .  pantoprazole (PROTONIX) 40 MG tablet, Take 1 tablet (40 mg total) by mouth daily. (Patient not taking: No sig reported), Disp: 30 tablet, Rfl: 3 .  potassium chloride (KLOR-CON) 20 MEQ packet, Take 20 mEq by mouth daily. (Patient not taking: No sig reported), Disp: 10 packet, Rfl: 0 .  sucralfate (CARAFATE) 1 g tablet, DISSOLVE 1 TABLET IN 3 TO 4 TABLESPOONS OF WARM WATER, THEN SWISH AND SWALLOW BY MOUTH THREE TIMES DAILY (Patient not taking: No sig reported), Disp: 90 tablet, Rfl: 1 .  traZODone (DESYREL) 50 MG tablet, TAKE 1 TABLET(50 MG) BY MOUTH AT BEDTIME (Patient not taking: No sig reported), Disp: 30 tablet, Rfl: 1  Physical exam:  Vitals:   07/03/20 0916  BP: (!) 143/97  Pulse: 70   Resp: 16  Temp: 98 F (36.7 C)  TempSrc: Oral  SpO2: 98%  Weight: 193 lb 8 oz (87.8 kg)  Height: 5\' 10"  (1.778 m)   Physical Exam HENT:     Head: Normocephalic and atraumatic.  Eyes:     Pupils: Pupils are equal, round, and reactive to light.  Cardiovascular:     Rate and Rhythm: Normal rate and regular rhythm.     Heart sounds: Normal heart sounds.  Pulmonary:     Effort: Pulmonary effort is normal.     Breath sounds: Normal breath sounds.  Abdominal:     General: Bowel sounds are normal.     Palpations: Abdomen is soft.  Musculoskeletal:     Cervical back: Normal range of motion.  Skin:    General: Skin is warm and dry.  Neurological:     Mental Status: He is alert and oriented to person, place, and time.      CMP Latest Ref Rng & Units 06/18/2020  Glucose 70 - 99 mg/dL 153(H)  BUN 8 - 23 mg/dL 14  Creatinine 0.61 - 1.24 mg/dL 1.10  Sodium 135 - 145 mmol/L 137  Potassium 3.5 - 5.1 mmol/L 3.4(L)  Chloride 98 - 111 mmol/L 106  CO2 22 - 32 mmol/L 22  Calcium 8.9 - 10.3 mg/dL 8.8(L)  Total Protein 6.5 - 8.1 g/dL 7.4  Total Bilirubin 0.3 - 1.2 mg/dL 0.7  Alkaline Phos 38 - 126 U/L 58  AST 15 - 41 U/L 19  ALT 0 - 44 U/L 10   CBC Latest Ref Rng & Units 07/03/2020  WBC 4.0 - 10.5 K/uL 5.3  Hemoglobin 13.0 - 17.0 g/dL 12.9(L)  Hematocrit 39.0 - 52.0 % 40.0  Platelets 150 - 400 K/uL 254      Assessment and plan- Patient is a 70 y.o. 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  He is here for on treatment assessment prior to cycle 12 of maintenance durvalumab  Counts okay to proceed with cycle 12 of maintenance durvalumab today.  He will be seen by covering MD/NP in 2 weeks for cycle 13.  Repeat CT chest abdomen and pelvis with contrast 3 weeks from now.  I will see him back in 4 weeks for cycle 14  History of COPD: He is currently on albuterol as needed as well as Anoro Ellipta inhaler.  I will have him follow-up with Dr. Patsey Berthold for  optimization of his medications given his symptoms of nocturnal wheezing   Visit Diagnosis 1. Malignant neoplasm of lower lobe of left lung ([place])   2. Encounter for antineoplastic immunotherapy      Dr. Randa Evens, MD, MPH [facility] at [facility] [phone] 07/03/2020  8:33 AM

## 2020-07-03 NOTE — Patient Instructions (Signed)
[facility]  Discharge Instructions: Thank you for choosing [facility] to provide your oncology and hematology care.  If you have a lab appointment with the [facility], please go directly to the [facility] and check in at the registration area.  Wear comfortable clothing and clothing appropriate for easy access to any Portacath or PICC line.   We strive to give you quality time with your provider. You may need to reschedule your appointment if you arrive late (15 or more minutes).  Arriving late affects you and other patients whose appointments are after yours.  Also, if you miss three or more appointments without notifying the office, you may be dismissed from the clinic at the provider's discretion.      For prescription refill requests, have your pharmacy contact our office and allow 72 hours for refills to be completed.    Today you received the following chemotherapy and/or immunotherapy agents : Durvalumab    To help prevent nausea and vomiting after your treatment, we encourage you to take your nausea medication as directed.  BELOW ARE SYMPTOMS THAT SHOULD BE REPORTED IMMEDIATELY: . *FEVER GREATER THAN 100.4 F (38 C) OR HIGHER . *CHILLS OR SWEATING . *NAUSEA AND VOMITING THAT IS NOT CONTROLLED WITH YOUR NAUSEA MEDICATION . *UNUSUAL SHORTNESS OF BREATH . *UNUSUAL BRUISING OR BLEEDING . *URINARY PROBLEMS (pain or burning when urinating, or frequent urination) . *BOWEL PROBLEMS (unusual diarrhea, constipation, pain near the anus) . TENDERNESS IN MOUTH AND THROAT WITH OR WITHOUT PRESENCE OF ULCERS (sore throat, sores in mouth, or a toothache) . UNUSUAL RASH, SWELLING OR PAIN  . UNUSUAL VAGINAL DISCHARGE OR ITCHING   Items with * indicate a potential emergency and should be followed up as soon as possible or go to the Emergency Department if any problems should occur.  Please show the CHEMOTHERAPY ALERT CARD or IMMUNOTHERAPY  ALERT CARD at check-in to the Emergency Department and triage nurse.  Should you have questions after your visit or need to cancel or reschedule your appointment, please contact [facility]  [phone] and follow the prompts.  Office hours are 8:00 a.m. to 4:30 p.m. Monday - Friday. Please note that voicemails left after 4:00 p.m. may not be returned until the following business day.  We are closed weekends and major holidays. You have access to a nurse at all times for urgent questions. Please call the main number to the clinic [phone] and follow the prompts.  For any non-urgent questions, you may also contact your provider using MyChart. We now offer e-Visits for anyone 43 and older to request care online for non-urgent symptoms. For details visit [email or web address].   Also download the MyChart app! Go to the app store, search "MyChart", open the app, select [facility], and log in with your MyChart username and password.  Due to Covid, a mask is required upon entering the hospital/clinic. If you do not have a mask, one will be given to you upon arrival. For doctor visits, patients may have 1 support person aged 68 or older with them. For treatment visits, patients cannot have anyone with them due to current Covid guidelines and our immunocompromised population.   Durvalumab injection What is this medicine? DURVALUMAB (dur VAL ue mab) is a monoclonal antibody. It is used to treat lung cancer. This medicine may be used for other purposes; ask your health care provider or pharmacist if you have questions. COMMON BRAND NAME(S): IMFINZI What  should I tell my health care provider before I take this medicine? They need to know if you have any of these conditions:  autoimmune diseases like Crohn's disease, ulcerative colitis, or lupus  have had or planning to have an allogeneic stem cell transplant (uses someone else's stem cells)  history of organ  transplant  history of radiation to the chest  nervous system problems like myasthenia gravis or Guillain-Barre syndrome  an unusual or allergic reaction to durvalumab, other medicines, foods, dyes, or preservatives  pregnant or trying to get pregnant  breast-feeding How should I use this medicine? This medicine is for infusion into a vein. It is given by a health care professional in a hospital or clinic setting. A special MedGuide will be given to you before each treatment. Be sure to read this information carefully each time. Talk to your pediatrician regarding the use of this medicine in children. Special care may be needed. Overdosage: If you think you have taken too much of this medicine contact a poison control center or emergency room at once. NOTE: This medicine is only for you. Do not share this medicine with others. What if I miss a dose? It is important not to miss your dose. Call your doctor or health care professional if you are unable to keep an appointment. What may interact with this medicine? Interactions have not been studied. This list may not describe all possible interactions. Give your health care provider a list of all the medicines, herbs, non-prescription drugs, or dietary supplements you use. Also tell them if you smoke, drink alcohol, or use illegal drugs. Some items may interact with your medicine. What should I watch for while using this medicine? This drug may make you feel generally unwell. Continue your course of treatment even though you feel ill unless your doctor tells you to stop. You may need blood work done while you are taking this medicine. Do not become pregnant while taking this medicine or for 3 months after stopping it. Women should inform their doctor if they wish to become pregnant or think they might be pregnant. There is a potential for serious side effects to an unborn child. Talk to your health care professional or pharmacist for more  information. Do not breast-feed an infant while taking this medicine or for 3 months after stopping it. What side effects may I notice from receiving this medicine? Side effects that you should report to your doctor or health care professional as soon as possible:  allergic reactions like skin rash, itching or hives, swelling of the face, lips, or tongue  black, tarry stools  bloody or watery diarrhea  breathing problems  change in emotions or moods  change in sex drive  changes in vision  chest pain or chest tightness  chills  confusion  cough  facial flushing  fever  headache  signs and symptoms of high blood sugar such as dizziness; dry mouth; dry skin; fruity breath; nausea; stomach pain; increased hunger or thirst; increased urination  signs and symptoms of liver injury like dark yellow or brown urine; general ill feeling or flu-like symptoms; light-colored stools; loss of appetite; nausea; right upper belly pain; unusually weak or tired; yellowing of the eyes or skin  stomach pain  trouble passing urine or change in the amount of urine  weight gain or weight loss Side effects that usually do not require medical attention (report these to your doctor or health care professional if they continue or are bothersome):  bone pain  constipation  loss of appetite  muscle pain  nausea  swelling of the ankles, feet, hands  tiredness This list may not describe all possible side effects. Call your doctor for medical advice about side effects. You may report side effects to FDA at 1-800-FDA-1088. Where should I keep my medicine? This drug is given in a hospital or clinic and will not be stored at home. NOTE: This sheet is a summary. It may not cover all possible information. If you have questions about this medicine, talk to your doctor, pharmacist, or health care provider.  2021 Elsevier/Gold Standard (2019-04-11 13:01:29)

## 2020-07-03 NOTE — Progress Notes (Signed)
Pt states that he sometimes has pain on left shin to foot . Does not have any pain today. Also when he wakes up he feels he has sob- he will use inhaler and it goes away. Eating and drinking good

## 2020-07-17 ENCOUNTER — Other Ambulatory Visit: Payer: Self-pay

## 2020-07-17 ENCOUNTER — Inpatient Hospital Stay: Payer: Medicaid Other

## 2020-07-17 ENCOUNTER — Encounter: Payer: Self-pay | Admitting: Oncology

## 2020-07-17 ENCOUNTER — Inpatient Hospital Stay (HOSPITAL_BASED_OUTPATIENT_CLINIC_OR_DEPARTMENT_OTHER): Payer: Medicaid Other | Admitting: Oncology

## 2020-07-17 ENCOUNTER — Ambulatory Visit
Admission: RE | Admit: 2020-07-17 | Discharge: 2020-07-17 | Disposition: A | Discharge status: Home / Self Care | Payer: Medicaid Other | Source: Ambulatory Visit | Attending: Oncology | Admitting: Oncology

## 2020-07-17 ENCOUNTER — Inpatient Hospital Stay: Payer: Medicaid Other | Attending: Oncology

## 2020-07-17 VITALS — BP 129/89 | HR 66 | Temp 87.6°F | Resp 20 | Wt 190.2 lb

## 2020-07-17 DIAGNOSIS — C3432 Malignant neoplasm of lower lobe, left bronchus or lung: Secondary | ICD-10-CM | POA: Insufficient documentation

## 2020-07-17 DIAGNOSIS — Z5112 Encounter for antineoplastic immunotherapy: Secondary | ICD-10-CM | POA: Insufficient documentation

## 2020-07-17 DIAGNOSIS — I10 Essential (primary) hypertension: Secondary | ICD-10-CM | POA: Insufficient documentation

## 2020-07-17 DIAGNOSIS — L299 Pruritus, unspecified: Secondary | ICD-10-CM | POA: Insufficient documentation

## 2020-07-17 DIAGNOSIS — Z79899 Other long term (current) drug therapy: Secondary | ICD-10-CM | POA: Insufficient documentation

## 2020-07-17 DIAGNOSIS — Z87891 Personal history of nicotine dependence: Secondary | ICD-10-CM | POA: Insufficient documentation

## 2020-07-17 DIAGNOSIS — E785 Hyperlipidemia, unspecified: Secondary | ICD-10-CM | POA: Insufficient documentation

## 2020-07-17 DIAGNOSIS — R072 Precordial pain: Secondary | ICD-10-CM | POA: Insufficient documentation

## 2020-07-17 DIAGNOSIS — J449 Chronic obstructive pulmonary disease, unspecified: Secondary | ICD-10-CM | POA: Insufficient documentation

## 2020-07-17 HISTORY — DX: Malignant neoplasm of lower lobe, left bronchus or lung: C34.32

## 2020-07-17 LAB — COMPREHENSIVE METABOLIC PANEL
ALT: 9 U/L (ref 0–44)
AST: 17 U/L (ref 15–41)
Albumin: 3.9 g/dL (ref 3.5–5.0)
Alkaline Phosphatase: 62 U/L (ref 38–126)
Anion gap: 10 (ref 5–15)
BUN: 17 mg/dL (ref 8–23)
CO2: 20 mmol/L — ABNORMAL LOW (ref 22–32)
Calcium: 8.8 mg/dL — ABNORMAL LOW (ref 8.9–10.3)
Chloride: 110 mmol/L (ref 98–111)
Creatinine, Ser: 1.03 mg/dL (ref 0.61–1.24)
GFR, Estimated: >60 mL/min (ref >60)
Glucose, Bld: 105 mg/dL — ABNORMAL HIGH (ref 70–99)
Potassium: 3.7 mmol/L (ref 3.5–5.1)
Sodium: 140 mmol/L (ref 135–145)
Total Bilirubin: 0.6 mg/dL (ref 0.3–1.2)
Total Protein: 7.5 g/dL (ref 6.5–8.1)

## 2020-07-17 LAB — CBC WITH DIFFERENTIAL/PLATELET
Abs Immature Granulocytes: 0.02 10*3/uL (ref 0.00–0.07)
Basophils Absolute: 0 10*3/uL (ref 0.0–0.1)
Basophils Relative: 0 %
Eosinophils Absolute: 0.1 10*3/uL (ref 0.0–0.5)
Eosinophils Relative: 1 %
HCT: 38.8 % — ABNORMAL LOW (ref 39.0–52.0)
Hemoglobin: 12.6 g/dL — ABNORMAL LOW (ref 13.0–17.0)
Immature Granulocytes: 0 %
Lymphocytes Relative: 18 %
Lymphs Abs: 1.1 10*3/uL (ref 0.7–4.0)
MCH: 26.1 pg (ref 26.0–34.0)
MCHC: 32.5 g/dL (ref 30.0–36.0)
MCV: 80.3 fL (ref 80.0–100.0)
Monocytes Absolute: 0.6 10*3/uL (ref 0.1–1.0)
Monocytes Relative: 10 %
Neutro Abs: 4.4 10*3/uL (ref 1.7–7.7)
Neutrophils Relative %: 71 %
Platelets: 256 10*3/uL (ref 150–400)
RBC: 4.83 MIL/uL (ref 4.22–5.81)
RDW: 17.3 % — ABNORMAL HIGH (ref 11.5–15.5)
WBC: 6.2 10*3/uL (ref 4.0–10.5)
nRBC: 0 % (ref 0.0–0.2)

## 2020-07-17 LAB — TSH: TSH: 1.445 u[IU]/mL (ref 0.350–4.500)

## 2020-07-17 MED ORDER — PREDNISONE 10 MG (21) PO TBPK: ORAL_TABLET | ORAL | 0 refills | Status: DC

## 2020-07-17 MED ORDER — HEPARIN SOD (PORK) LOCK FLUSH 100 UNIT/ML IV SOLN
500.0000 [IU] | Freq: Once | INTRAVENOUS | Status: AC
Start: 2020-07-17 — End: 2020-07-17
  Administered 2020-07-17: 500 [IU] via INTRAVENOUS
  Filled 2020-07-17: qty 5

## 2020-07-17 MED ORDER — SODIUM CHLORIDE 0.9% FLUSH
10.0000 mL | Freq: Once | INTRAVENOUS | Status: AC
Start: 2020-07-17 — End: 2020-07-17
  Administered 2020-07-17: 10 mL via INTRAVENOUS
  Filled 2020-07-17: qty 10

## 2020-07-17 NOTE — Progress Notes (Signed)
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 He underwent CT angios chest which did not show any PE. Soft tissue attenuation measuring 2.9 x 4 cm centered in the left hilum resulting in abrupt angulation and narrowing of the pulmonary arteries and the central left lower lobe airways. Additional ipsilateral hilar and subcarinal adenopathy. No contralateral adenopathy. Consolidative masslike opacity 3.6 x 3.1 cm in size and contiguous with more central perihilar soft tissue attenuation. Overall findings concerning for primary bronchogenic carcinoma.  Patient lives with his sister who is his main caregiver. He does not drive but is independent of his ADLs. Reports ongoing fatigue and occasional retrosternal chest pain. He has been evaluated by GI in the past for reflux as well. Appetieis fair and weight is stable.Denies any significant shortness of breath at this time.Patient is an ex-smoker and smoked for about 20 years but quit smoking back in 1994  PET CT scan showed hypermetabolic mass in the left lower lobe with mediastinal adenopathy and right paratracheal adenopathy. No evidence of distant metastatic disease. Pathology was consistent with non-small cell lung cancer favor adenocarcinoma. Cells were positive for TTF-1 and negative  for p40.  Patient started concurrent carbotaxol radiation and then had allergic reaction to Taxol for which she was switched to Abraxane. Given the short supply of Abraxane patient received carbo Alimta midway through his concurrent chemoradiation.Scans post chemoradiation showed partial response and patient will be started on maintenance durvalumabon 01/14/2020  Interval history-reports feeling like he has a "cold".  Has nasal congestion and discharge.  Has cough that is chronic but seems to be worse with yellow/white sputum production.  Denies a fever.  Has wheezing especially while sleeping.  Uses albuterol inhaler with some relief.  Feels his work of breathing has increased.   ECOG PS- 1 Pain scale- 0  Review of systems- Review of Systems  Constitutional: Positive for malaise/fatigue. Negative for chills, fever and weight loss.  HENT: Negative for congestion, ear discharge and nosebleeds.   Eyes: Negative for blurred vision.  Respiratory: Positive for cough, sputum production, shortness of breath and wheezing. Negative for hemoptysis.   Cardiovascular: Negative for chest pain, palpitations, orthopnea and claudication.  Gastrointestinal: Negative for abdominal pain, blood in stool, constipation, diarrhea, heartburn, melena, nausea and vomiting.  Genitourinary: Negative for dysuria, flank pain, frequency, hematuria and urgency.  Musculoskeletal: Negative for back pain, joint pain and myalgias.  Skin: Negative for rash.  Neurological: Negative for dizziness, tingling, focal weakness, seizures, weakness and headaches.  Endo/Heme/Allergies: Does not bruise/bleed easily.  Psychiatric/Behavioral: Negative for depression and suicidal ideas. The patient does not have insomnia.        Allergies  Allergen Reactions  . Taxol [Paclitaxel] Other (See Comments)    Chest pain, hip pain, coughing , wheezing     Past Medical History:  Diagnosis Date  . Cancer ([place])   . Dyspnea   .  Hyperlipidemia   .  Hypertension      Past Surgical History:  Procedure Laterality Date  . PORTA CATH INSERTION N/A 10/22/2019   Procedure: PORTA CATH INSERTION;  Surgeon: Katha Cabal, MD;  Location: [facility];  Service: Cardiovascular;  Laterality: N/A;  . VIDEO BRONCHOSCOPY WITH ENDOBRONCHIAL ULTRASOUND N/A 09/25/2019   Procedure: VIDEO BRONCHOSCOPY WITH ENDOBRONCHIAL ULTRASOUND;  Surgeon: Tyler Pita, MD;  Location: [facility] OR[facility];  Service: Pulmonary;  Laterality: N/A;    Social History   Socioeconomic History  . Marital status: Single    Spouse name: Not on file  . Number of children: Not on file  . Years of education: Not on file  . Highest education level: Not on file  Occupational History  . Not on file  Tobacco Use  . Smoking status: Former Smoker    Packs/day: 1.00    Years: 20.00    Pack years: 20.00    Types: Cigarettes    Quit date: 1994    Years since quitting: 28.4  . Smokeless tobacco: Never Used  Vaping Use  . Vaping Use: Never used  Substance and Sexual Activity  . Alcohol use: Not Currently    Comment: not drank any beer in 2 onths  . Drug use: Never  . Sexual activity: Not on file  Other Topics Concern  . Not on file  Social History Narrative  . Not on file   Social Determinants of Health   Financial Resource Strain: Not on file  Food Insecurity: Not on file  Transportation Needs: Not on file  Physical Activity: Not on file  Stress: Not on file  Social Connections: Not on file  Intimate Partner Violence: Not on file    Family History  Problem Relation Age of Onset  . Cancer Brother      Current Outpatient Medications:  .  acetaminophen (TYLENOL) 500 MG tablet, Take 1,000 mg by mouth every 6 (six) hours as needed for mild pain, moderate pain or headache., Disp: , Rfl:  .  albuterol (VENTOLIN HFA) 108 (90 Base) MCG/ACT inhaler, Inhale 2 puffs into the lungs every 4 (four) hours as needed for wheezing or shortness of  breath., Disp: 1 each, Rfl: 2 .  ANORO ELLIPTA 62.5-25 MCG/INH AEPB, Inhale 1 puff into the lungs daily., Disp: 1 each, Rfl: 2 .  benzonatate (TESSALON PERLES) 100 MG capsule, Take 1 capsule (100 mg total) by mouth every 6 (six) hours as needed for cough., Disp: 90 capsule, Rfl: 0 .  hydrOXYzine (ATARAX/VISTARIL) 25 MG tablet, Take 25 mg by mouth daily. (Patient not taking: Reported on 07/03/2020), Disp: , Rfl:  .  lidocaine-prilocaine (EMLA) cream, Apply 1 application topically once., Disp: , Rfl:  .  oxyCODONE (OXY IR/ROXICODONE) 5 MG immediate release tablet, Take 2 tablets (10 mg total) by mouth every 4 (four) hours as needed for severe pain., Disp: 90 tablet, Rfl: 0 .  sucralfate (CARAFATE) 1 g tablet, DISSOLVE 1 TABLET IN 3 TO 4 TABLESPOONS OF WARM WATER, THEN SWISH AND SWALLOW BY MOUTH THREE TIMES DAILY (Patient not taking: No sig reported), Disp: 90 tablet, Rfl: 1 .  traZODone (DESYREL) 50 MG tablet, TAKE 1 TABLET(50 MG) BY MOUTH AT BEDTIME (Patient not taking: No sig reported), Disp: 30 tablet, Rfl: 1  Physical exam:  There were no vitals filed for this visit. Physical Exam HENT:     Head: Normocephalic and atraumatic.  Eyes:     Pupils: Pupils are equal, round, and reactive to light.  Cardiovascular:  Rate and Rhythm: Normal rate and regular rhythm.     Heart sounds: Normal heart sounds.  Pulmonary:     Effort: Pulmonary effort is normal.     Breath sounds: Examination of the right-upper field reveals wheezing. Examination of the left-upper field reveals wheezing. Decreased breath sounds and wheezing present.  Abdominal:     General: Bowel sounds are normal.     Palpations: Abdomen is soft.  Musculoskeletal:     Cervical back: Normal range of motion.  Skin:    General: Skin is warm and dry.  Neurological:     Mental Status: He is alert and oriented to person, place, and time.      CMP Latest Ref Rng & Units 07/03/2020  Glucose 70 - 99 mg/dL 113(H)  BUN 8 - 23 mg/dL 12   Creatinine 0.61 - 1.24 mg/dL 1.01  Sodium 135 - 145 mmol/L 139  Potassium 3.5 - 5.1 mmol/L 3.4(L)  Chloride 98 - 111 mmol/L 108  CO2 22 - 32 mmol/L 22  Calcium 8.9 - 10.3 mg/dL 9.1  Total Protein 6.5 - 8.1 g/dL 7.3  Total Bilirubin 0.3 - 1.2 mg/dL 0.9  Alkaline Phos 38 - 126 U/L 63  AST 15 - 41 U/L 19  ALT 0 - 44 U/L 10   CBC Latest Ref Rng & Units 07/03/2020  WBC 4.0 - 10.5 K/uL 5.3  Hemoglobin 13.0 - 17.0 g/dL 12.9(L)  Hematocrit 39.0 - 52.0 % 40.0  Platelets 150 - 400 K/uL 254      Assessment and plan- Patient is a 70 y.o. 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  He is here for on treatment assessment prior to cycle 14 of maintenance durvalumab.   Hold cycle 14 today d/t wheezing and patient not feeling well.  Will get his CT scan moved up from 07/21/2020 to today.  Patient in agreement.  Hold durvalumab and return to clinic in 1 week (Per patients sister she she can only bring him on 07/31/20)  Labs from 07/17/2020 are stable.  COPD: Followed by Dr. Patsey Berthold.  Continue Anoro.  Addendum: CT scan results show interval decrease in size of spiculated mass of left lower lobe consistent with treatment response.  There is some interval development of adjacent bandlike scarring and fibrosis consistent with radiation fibrosis.  No discretely enlarged mediastinal, hilar or axillary lymph nodes.  There is scattered nonspecific infectious or inflammatory groundglass opacity predominantly dependent right lower lobe but not significantly changed compared to prior examination  CT scan does not definitively show pneumonitis or infection.  He denies a fever.  I will start him on a steroid taper to see if this improves his symptoms.  I do not believe he needs antibiotics at this time.  He will call if he develops a fever or worsening symptoms.  Greater than 50% was spent in counseling and coordination of care with this patient including but not limited to discussion of  the relevant topics above (See A&P) including, but not limited to diagnosis and management of acute and chronic medical conditions.   Visit Diagnosis No diagnosis found.  Faythe Casa, NP 07/17/2020 4:29 PM

## 2020-07-17 NOTE — Progress Notes (Signed)
Patient here for pre treatment check he reports that he is experiencing dyspnea with excertion.

## 2020-07-21 ENCOUNTER — Ambulatory Visit: Admission: RE | Admit: 2020-07-21 | Payer: Medicaid Other | Source: Ambulatory Visit

## 2020-07-31 ENCOUNTER — Inpatient Hospital Stay: Payer: Medicaid Other

## 2020-07-31 ENCOUNTER — Encounter: Payer: Self-pay | Admitting: Oncology

## 2020-07-31 ENCOUNTER — Inpatient Hospital Stay (HOSPITAL_BASED_OUTPATIENT_CLINIC_OR_DEPARTMENT_OTHER): Payer: Medicaid Other | Admitting: Oncology

## 2020-07-31 VITALS — BP 110/75 | HR 76 | Temp 97.4°F | Resp 18 | Ht 70.0 in | Wt 185.4 lb

## 2020-07-31 DIAGNOSIS — I10 Essential (primary) hypertension: Secondary | ICD-10-CM | POA: Diagnosis not present

## 2020-07-31 DIAGNOSIS — Z87891 Personal history of nicotine dependence: Secondary | ICD-10-CM | POA: Diagnosis not present

## 2020-07-31 DIAGNOSIS — L299 Pruritus, unspecified: Secondary | ICD-10-CM | POA: Diagnosis not present

## 2020-07-31 DIAGNOSIS — Z5112 Encounter for antineoplastic immunotherapy: Secondary | ICD-10-CM | POA: Diagnosis present

## 2020-07-31 DIAGNOSIS — C3432 Malignant neoplasm of lower lobe, left bronchus or lung: Secondary | ICD-10-CM

## 2020-07-31 DIAGNOSIS — Z79899 Other long term (current) drug therapy: Secondary | ICD-10-CM | POA: Diagnosis not present

## 2020-07-31 DIAGNOSIS — E785 Hyperlipidemia, unspecified: Secondary | ICD-10-CM | POA: Diagnosis not present

## 2020-07-31 DIAGNOSIS — R072 Precordial pain: Secondary | ICD-10-CM | POA: Diagnosis not present

## 2020-07-31 DIAGNOSIS — J449 Chronic obstructive pulmonary disease, unspecified: Secondary | ICD-10-CM | POA: Diagnosis not present

## 2020-07-31 LAB — CBC WITH DIFFERENTIAL/PLATELET
Abs Immature Granulocytes: 0.04 10*3/uL (ref 0.00–0.07)
Basophils Absolute: 0 10*3/uL (ref 0.0–0.1)
Basophils Relative: 0 %
Eosinophils Absolute: 0.1 10*3/uL (ref 0.0–0.5)
Eosinophils Relative: 1 %
HCT: 40.2 % (ref 39.0–52.0)
Hemoglobin: 12.9 g/dL — ABNORMAL LOW (ref 13.0–17.0)
Immature Granulocytes: 1 %
Lymphocytes Relative: 15 %
Lymphs Abs: 1.2 10*3/uL (ref 0.7–4.0)
MCH: 25.9 pg — ABNORMAL LOW (ref 26.0–34.0)
MCHC: 32.1 g/dL (ref 30.0–36.0)
MCV: 80.7 fL (ref 80.0–100.0)
Monocytes Absolute: 0.6 10*3/uL (ref 0.1–1.0)
Monocytes Relative: 9 %
Neutro Abs: 5.6 10*3/uL (ref 1.7–7.7)
Neutrophils Relative %: 74 %
Platelets: 258 10*3/uL (ref 150–400)
RBC: 4.98 MIL/uL (ref 4.22–5.81)
RDW: 17.3 % — ABNORMAL HIGH (ref 11.5–15.5)
WBC: 7.6 10*3/uL (ref 4.0–10.5)
nRBC: 0 % (ref 0.0–0.2)

## 2020-07-31 LAB — COMPREHENSIVE METABOLIC PANEL
ALT: 12 U/L (ref 0–44)
AST: 20 U/L (ref 15–41)
Albumin: 3.7 g/dL (ref 3.5–5.0)
Alkaline Phosphatase: 67 U/L (ref 38–126)
Anion gap: 10 (ref 5–15)
BUN: 10 mg/dL (ref 8–23)
CO2: 23 mmol/L (ref 22–32)
Calcium: 9 mg/dL (ref 8.9–10.3)
Chloride: 104 mmol/L (ref 98–111)
Creatinine, Ser: 0.96 mg/dL (ref 0.61–1.24)
GFR, Estimated: >60 mL/min (ref >60)
Glucose, Bld: 136 mg/dL — ABNORMAL HIGH (ref 70–99)
Potassium: 3.6 mmol/L (ref 3.5–5.1)
Sodium: 137 mmol/L (ref 135–145)
Total Bilirubin: 0.7 mg/dL (ref 0.3–1.2)
Total Protein: 7.3 g/dL (ref 6.5–8.1)

## 2020-07-31 MED ORDER — SODIUM CHLORIDE 0.9% FLUSH
10.0000 mL | INTRAVENOUS | Status: DC | PRN
  Administered 2020-07-31: 10 mL via INTRAVENOUS
  Filled 2020-07-31: qty 10

## 2020-07-31 MED ORDER — HEPARIN SOD (PORK) LOCK FLUSH 100 UNIT/ML IV SOLN
INTRAVENOUS | Status: AC
  Filled 2020-07-31: qty 5

## 2020-07-31 MED ORDER — SODIUM CHLORIDE 0.9 % IV SOLN
Freq: Once | INTRAVENOUS | Status: AC
  Filled 2020-07-31: qty 250

## 2020-07-31 MED ORDER — HEPARIN SOD (PORK) LOCK FLUSH 100 UNIT/ML IV SOLN
500.0000 [IU] | Freq: Once | INTRAVENOUS | Status: DC | PRN
  Filled 2020-07-31: qty 5

## 2020-07-31 MED ORDER — SODIUM CHLORIDE 0.9 % IV SOLN
1500.0000 mg | Freq: Once | INTRAVENOUS | Status: AC
  Administered 2020-07-31: 1500 mg via INTRAVENOUS
  Filled 2020-07-31: qty 30

## 2020-07-31 MED ORDER — HEPARIN SOD (PORK) LOCK FLUSH 100 UNIT/ML IV SOLN
500.0000 [IU] | Freq: Once | INTRAVENOUS | Status: AC
  Administered 2020-07-31: 500 [IU] via INTRAVENOUS
  Filled 2020-07-31: qty 5

## 2020-07-31 NOTE — Patient Instructions (Signed)
[facility]  Discharge Instructions: Thank you for choosing [facility] to provide your oncology and hematology care.  If you have a lab appointment with the [facility], please go directly to the [facility] and check in at the registration area.  Wear comfortable clothing and clothing appropriate for easy access to any Portacath or PICC line.   We strive to give you quality time with your provider. You may need to reschedule your appointment if you arrive late (15 or more minutes).  Arriving late affects you and other patients whose appointments are after yours.  Also, if you miss three or more appointments without notifying the office, you may be dismissed from the clinic at the provider's discretion.      For prescription refill requests, have your pharmacy contact our office and allow 72 hours for refills to be completed.    Today you received the following chemotherapy and/or immunotherapy agents Imfinzi      To help prevent nausea and vomiting after your treatment, we encourage you to take your nausea medication as directed.  BELOW ARE SYMPTOMS THAT SHOULD BE REPORTED IMMEDIATELY: *FEVER GREATER THAN 100.4 F (38 C) OR HIGHER *CHILLS OR SWEATING *NAUSEA AND VOMITING THAT IS NOT CONTROLLED WITH YOUR NAUSEA MEDICATION *UNUSUAL SHORTNESS OF BREATH *UNUSUAL BRUISING OR BLEEDING *URINARY PROBLEMS (pain or burning when urinating, or frequent urination) *BOWEL PROBLEMS (unusual diarrhea, constipation, pain near the anus) TENDERNESS IN MOUTH AND THROAT WITH OR WITHOUT PRESENCE OF ULCERS (sore throat, sores in mouth, or a toothache) UNUSUAL RASH, SWELLING OR PAIN  UNUSUAL VAGINAL DISCHARGE OR ITCHING   Items with * indicate a potential emergency and should be followed up as soon as possible or go to the Emergency Department if any problems should occur.  Please show the CHEMOTHERAPY ALERT CARD or IMMUNOTHERAPY ALERT CARD at check-in to  the Emergency Department and triage nurse.  Should you have questions after your visit or need to cancel or reschedule your appointment, please contact [facility]  [phone] and follow the prompts.  Office hours are 8:00 a.m. to 4:30 p.m. Monday - Friday. Please note that voicemails left after 4:00 p.m. may not be returned until the following business day.  We are closed weekends and major holidays. You have access to a nurse at all times for urgent questions. Please call the main number to the clinic [phone] and follow the prompts.  For any non-urgent questions, you may also contact your provider using MyChart. We now offer e-Visits for anyone 29 and older to request care online for non-urgent symptoms. For details visit [email or web address].   Also download the MyChart app! Go to the app store, search "MyChart", open the app, select [facility], and log in with your MyChart username and password.  Due to Covid, a mask is required upon entering the hospital/clinic. If you do not have a mask, one will be given to you upon arrival. For doctor visits, patients may have 1 support person aged 1 or older with them. For treatment visits, patients cannot have anyone with them due to current Covid guidelines and our immunocompromised population.

## 2020-07-31 NOTE — Progress Notes (Signed)
Patient sts that very itchy at night .. scratching he sts nothing is helping with the itch

## 2020-07-31 NOTE — Progress Notes (Signed)
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  He underwent CT angios chest which did not show any PE.  Soft tissue attenuation measuring 2.9 x 4 cm centered in the left hilum resulting in abrupt angulation and narrowing of the pulmonary arteries and the central left lower lobe airways.  Additional ipsilateral hilar and subcarinal adenopathy.  No contralateral adenopathy.  Consolidative masslike opacity 3.6 x 3.1 cm in size and contiguous with more central perihilar soft tissue attenuation.  Overall findings concerning for primary bronchogenic carcinoma.    Patient lives with his sister who is his main caregiver.  He does not drive but is independent of his ADLs.  Reports ongoing fatigue and occasional retrosternal chest pain.  He has been evaluated by GI in the past for reflux as well.  Appetie is fair and weight is stable.  Denies any significant shortness of breath at this time.  Patient is an ex-smoker and smoked for about 20 years but quit smoking back in 1994   PET CT scan showed hypermetabolic mass in the left lower lobe with mediastinal adenopathy and right paratracheal adenopathy.  No evidence of distant metastatic disease.  Pathology was consistent with non-small cell lung cancer favor adenocarcinoma.  Cells were positive for TTF-1 and negative  for p40.   Patient started concurrent carbotaxol radiation and then had allergic reaction to Taxol for which she was switched to Abraxane.  Given the short supply of Abraxane patient received carbo Alimta midway through his concurrent chemoradiation.  Scans post chemoradiation showed partial response and patient will be started on maintenance durvalumab on 01/14/2020      Interval history-patient still reports having itching mainly over his bilateral forearms at night.  Does not complain of much itching during the day  ECOG PS- 1 Pain scale- 0 Opioid associated constipation- no  Review of systems- Review of Systems  Constitutional:  Negative for chills, fever, malaise/fatigue and weight loss.  HENT:  Negative for congestion, ear discharge and nosebleeds.   Eyes:  Negative for blurred vision.  Respiratory:  Negative for cough, hemoptysis, sputum production, shortness of breath and wheezing.   Cardiovascular:  Negative for chest pain, palpitations, orthopnea and claudication.  Gastrointestinal:  Negative for abdominal pain, blood in stool, constipation, diarrhea, heartburn, melena, nausea and vomiting.  Genitourinary:  Negative for dysuria, flank pain, frequency, hematuria and urgency.  Musculoskeletal:  Negative for back pain, joint pain and myalgias.  Skin:  Positive for itching. Negative for rash.  Neurological:  Negative for dizziness, tingling, focal weakness, seizures, weakness and headaches.  Endo/Heme/Allergies:  Does not bruise/bleed easily.  Psychiatric/Behavioral:  Negative for depression and suicidal ideas. The patient does not have insomnia.      Allergies  Allergen Reactions   Taxol [Paclitaxel] Other (See Comments)    Chest pain, hip pain, coughing , wheezing     Past Medical History:  Diagnosis Date  Dyspnea    Hyperlipidemia    Hypertension    Primary malignant neoplasm of left lower lobe of lung [facility])      Past Surgical History:  Procedure Laterality Date    PORTA CATH INSERTION N/A 10/22/2019   Procedure: PORTA CATH INSERTION;  Surgeon: Katha Cabal, MD;  Location: [place] [facility];  Service: Cardiovascular;  Laterality: N/A;   VIDEO BRONCHOSCOPY WITH ENDOBRONCHIAL ULTRASOUND N/A 09/25/2019   Procedure: VIDEO BRONCHOSCOPY WITH ENDOBRONCHIAL ULTRASOUND;  Surgeon: Tyler Pita, MD;  Location: [facility] ORS;  Service: Pulmonary;  Laterality: N/A;    Social History   Socioeconomic History   Marital status: Single    Spouse name: Not on file   Number of children: Not on file   Years of education: Not on file   Highest education level: Not on file  Occupational History   Not on file  Tobacco Use   Smoking status: Former    Packs/day: 1.00    Years: 20.00    Pack years: 20.00    Types: Cigarettes    Quit date: 3    Years since quitting: 28.4   Smokeless tobacco: Never  Vaping Use   Vaping Use: Never used  Substance and Sexual Activity   Alcohol use: Not Currently    Comment: not drank any beer in 2 onths   Drug use: Never   Sexual activity: Not on file  Other Topics Concern   Not on file  Social History Narrative   Not on file   Social Determinants of Health   Financial Resource Strain: Not on file  Food Insecurity: Not on file  Transportation Needs: Not on file  Physical Activity: Not on file  Stress: Not on file  Social Connections: Not on file  Intimate Partner Violence: Not on file    Family History  Problem Relation Age of Onset   Cancer Brother      Current Outpatient Medications:    acetaminophen (TYLENOL) 500 MG tablet, Take 1,000 mg by mouth every 6 (six) hours as needed for mild pain, moderate pain or headache., Disp: , Rfl:    albuterol (VENTOLIN HFA) 108 (90 Base) MCG/ACT inhaler, Inhale 2 puffs into the lungs every 4 (four) hours as needed for wheezing or shortness of breath., Disp: 1 each, Rfl: 2   ANORO ELLIPTA 62.5-25 MCG/INH AEPB, Inhale 1 puff into the lungs daily., Disp: 1 each, Rfl: 2    benzonatate (TESSALON PERLES) 100 MG capsule, Take 1 capsule (100 mg total) by mouth every 6 (six) hours as needed for cough., Disp: 90 capsule, Rfl: 0   hydrOXYzine (ATARAX/VISTARIL) 25 MG tablet, Take 25 mg by mouth daily., Disp: , Rfl:    lidocaine-prilocaine (EMLA) cream, Apply 1 application topically once., Disp: , Rfl:    oxyCODONE (OXY IR/ROXICODONE) 5 MG immediate release tablet, Take 2 tablets (10 mg total) by mouth every 4 (four) hours as needed for severe pain. (Patient not taking: Reported on 07/17/2020), Disp: 90 tablet, Rfl: 0   predniSONE (STERAPRED UNI-PAK 21 TAB) 10 MG (21) TBPK tablet, Take as directed. (Patient not taking: Reported on 07/31/2020), Disp: 21 tablet, Rfl: 0   sucralfate (CARAFATE) 1 g tablet, DISSOLVE 1 TABLET IN 3 TO 4 TABLESPOONS OF WARM WATER, THEN SWISH AND SWALLOW BY MOUTH THREE TIMES DAILY (Patient not taking: No sig reported), Disp: 90 tablet, Rfl: 1   traZODone (DESYREL) 50 MG tablet, TAKE 1 TABLET(50 MG) BY MOUTH AT BEDTIME (Patient not taking: No sig reported),  Disp: 30 tablet, Rfl: 1  Physical exam:  Vitals:   07/31/20 0856  BP: 110/75  Pulse: 76  Resp: 18  Temp: (!) 97.4 F (36.3 C)  SpO2: 98%  Weight: 185 lb 6.4 oz (84.1 kg)  Height: 5\' 10"  (1.778 m)   Physical Exam Cardiovascular:     Rate and Rhythm: Normal rate and regular rhythm.     Heart sounds: Normal heart sounds.  Pulmonary:     Effort: Pulmonary effort is normal.     Breath sounds: Normal breath sounds.  Abdominal:     General: Bowel sounds are normal.     Palpations: Abdomen is soft.  Skin:    General: Skin is warm and dry.  Neurological:     Mental Status: He is alert and oriented to person, place, and time.     CMP Latest Ref Rng & Units 07/31/2020  Glucose 70 - 99 mg/dL 136(H)  BUN 8 - 23 mg/dL 10  Creatinine 0.61 - 1.24 mg/dL 0.96  Sodium 135 - 145 mmol/L 137  Potassium 3.5 - 5.1 mmol/L 3.6  Chloride 98 - 111 mmol/L 104  CO2 22 - 32 mmol/L 23  Calcium 8.9 - 10.3  mg/dL 9.0  Total Protein 6.5 - 8.1 g/dL 7.3  Total Bilirubin 0.3 - 1.2 mg/dL 0.7  Alkaline Phos 38 - 126 U/L 67  AST 15 - 41 U/L 20  ALT 0 - 44 U/L 12   CBC Latest Ref Rng & Units 07/31/2020  WBC 4.0 - 10.5 K/uL 7.6  Hemoglobin 13.0 - 17.0 g/dL 12.9(L)  Hematocrit 39.0 - 52.0 % 40.2  Platelets 150 - 400 K/uL 258    No images are attached to the encounter.  CT CHEST ABDOMEN PELVIS WO CONTRAST  Result Date: 07/17/2020 CLINICAL DATA:  Left lower lobe lung cancer restaging, chemotherapy and radiation EXAM: CT CHEST, ABDOMEN AND PELVIS WITHOUT CONTRAST TECHNIQUE: Multidetector CT imaging of the chest, abdomen and pelvis was performed following the standard protocol without IV contrast. COMPARISON:  04/08/2020 FINDINGS: CT CHEST FINDINGS Cardiovascular: Right chest port catheter. Normal heart size. No pericardial effusion. Mediastinum/Nodes: Unchanged noncontrast post treatment appearance of soft tissue about the left hilum. No discretely enlarged mediastinal, hilar, or axillary lymph nodes. Thyroid gland, trachea, and esophagus demonstrate no significant findings. Lungs/Pleura: Diffuse bilateral bronchial wall thickening. Minimal centrilobular emphysema. Interval decrease in size of a spiculated mass of the superior segment left lower lobe, measuring 2.9 x 2.4 cm, previously 3.4 x 2.8 cm (series 3, image 83). There is been continued interval development of adjacent bandlike scarring and fibrosis, in keeping with expected evolution of radiation fibrosis. There is scattered nonspecific infectious or inflammatory ground-glass opacity predominantly in the dependent right lower lobe, not significantly changed compared to prior examination. No pleural effusion or pneumothorax. Musculoskeletal: No chest wall mass or suspicious bone lesions identified. CT ABDOMEN PELVIS FINDINGS Hepatobiliary: No solid liver abnormality is seen. No gallstones, gallbladder wall thickening, or biliary dilatation. Pancreas:  Unchanged fluid attenuation lesion of the pancreatic body measuring 1.5 cm (series 2, image 60). No pancreatic ductal dilatation or surrounding inflammatory changes. Spleen: Normal in size without significant abnormality. Adrenals/Urinary Tract: Adrenal glands are unremarkable. Kidneys are normal, without renal calculi, solid lesion, or hydronephrosis. Bladder is unremarkable. Stomach/Bowel: Stomach is within normal limits. Appendix appears normal. No evidence of bowel wall thickening, distention, or inflammatory changes. Vascular/Lymphatic: Aortic atherosclerosis. No enlarged abdominal or pelvic lymph nodes. Reproductive: Prostatomegaly. Other: No abdominal wall hernia or abnormality. No abdominopelvic ascites.  Musculoskeletal: No acute or significant osseous findings. IMPRESSION: 1. Interval decrease in size of a spiculated mass of the superior segment left lower lobe, consistent with treatment response. There has been continued interval development of adjacent bandlike scarring and fibrosis, in keeping with expected evolution of radiation fibrosis. 2. Unchanged noncontrast post treatment appearance of soft tissue about the left hilum. No discretely enlarged mediastinal, hilar, or axillary lymph nodes. 3. There is scattered nonspecific infectious or inflammatory ground-glass opacity predominantly in the dependent right lower lobe, not significantly changed compared to prior examination. Attention on follow-up. 4. Minimal emphysema and diffuse bilateral bronchial wall thickening. 5. No noncontrast evidence of metastatic disease in the abdomen or pelvis. 6. Unchanged fluid attenuation lesion of the pancreatic body measuring 1.5 cm. 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 As there is no observed increased risk of malignancy associated with such lesions smaller than 2 cm, no specific follow-up or characterization is required. 7. Prostatomegaly. Aortic Atherosclerosis  (ICD10-I70.0) and Emphysema (ICD10-J43.9). Electronically Signed   By: Eddie Candle M.D.   On: 07/17/2020 14:01     Assessment and plan- Patient is a 70 y.o. 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  He is here for on treatment assessment prior to cycle 13 of maintenance durvalumab  Discussed recent results of CT chest abdomen and pelvis which did not show any evidence of recurrent or progressive disease.  Plan is to continue durvalumab and finish treatment for 1 year.  I will be switching him to every 4 weeks treatment starting today.  He will receive thousand 500 mg of durvalumab today and I will see him back in 3 weeks for cycle 14  Itching/pruritus: Etiology unclear does not appear to be related to durvalumab as it mainly comes on at night and does not have the appearance of a typical drug-induced skin rash.  There are few scattered scratch marks on his bilateral forearms.  He will take Benadryl or loratadine as needed   Visit Diagnosis 1. Encounter for antineoplastic immunotherapy   2. Malignant neoplasm of lower lobe of left lung ([place])      Dr. Randa Evens, MD, MPH [facility] at [facility] [phone] 07/31/2020 5:04 PM

## 2020-08-28 ENCOUNTER — Encounter: Payer: Self-pay | Admitting: Oncology

## 2020-08-28 ENCOUNTER — Inpatient Hospital Stay (HOSPITAL_BASED_OUTPATIENT_CLINIC_OR_DEPARTMENT_OTHER): Payer: Medicaid Other | Admitting: Oncology

## 2020-08-28 ENCOUNTER — Other Ambulatory Visit: Payer: Self-pay

## 2020-08-28 ENCOUNTER — Inpatient Hospital Stay: Payer: Medicaid Other | Attending: Oncology

## 2020-08-28 ENCOUNTER — Inpatient Hospital Stay: Payer: Medicaid Other

## 2020-08-28 VITALS — BP 142/83 | HR 68 | Temp 97.0°F | Resp 19

## 2020-08-28 DIAGNOSIS — Z5112 Encounter for antineoplastic immunotherapy: Secondary | ICD-10-CM | POA: Diagnosis present

## 2020-08-28 DIAGNOSIS — Z87891 Personal history of nicotine dependence: Secondary | ICD-10-CM | POA: Insufficient documentation

## 2020-08-28 DIAGNOSIS — I10 Essential (primary) hypertension: Secondary | ICD-10-CM | POA: Diagnosis not present

## 2020-08-28 DIAGNOSIS — E785 Hyperlipidemia, unspecified: Secondary | ICD-10-CM | POA: Diagnosis not present

## 2020-08-28 DIAGNOSIS — Z79899 Other long term (current) drug therapy: Secondary | ICD-10-CM | POA: Insufficient documentation

## 2020-08-28 DIAGNOSIS — C3432 Malignant neoplasm of lower lobe, left bronchus or lung: Secondary | ICD-10-CM | POA: Diagnosis not present

## 2020-08-28 DIAGNOSIS — R0602 Shortness of breath: Secondary | ICD-10-CM | POA: Insufficient documentation

## 2020-08-28 LAB — COMPREHENSIVE METABOLIC PANEL
ALT: 10 U/L (ref 0–44)
AST: 18 U/L (ref 15–41)
Albumin: 3.6 g/dL (ref 3.5–5.0)
Alkaline Phosphatase: 66 U/L (ref 38–126)
Anion gap: 9 (ref 5–15)
BUN: 14 mg/dL (ref 8–23)
CO2: 22 mmol/L (ref 22–32)
Calcium: 8.5 mg/dL — ABNORMAL LOW (ref 8.9–10.3)
Chloride: 107 mmol/L (ref 98–111)
Creatinine, Ser: 0.95 mg/dL (ref 0.61–1.24)
GFR, Estimated: >60 mL/min (ref >60)
Glucose, Bld: 125 mg/dL — ABNORMAL HIGH (ref 70–99)
Potassium: 3 mmol/L — ABNORMAL LOW (ref 3.5–5.1)
Sodium: 138 mmol/L (ref 135–145)
Total Bilirubin: 0.5 mg/dL (ref 0.3–1.2)
Total Protein: 7 g/dL (ref 6.5–8.1)

## 2020-08-28 LAB — CBC WITH DIFFERENTIAL/PLATELET
Abs Immature Granulocytes: 0.06 10*3/uL (ref 0.00–0.07)
Basophils Absolute: 0 10*3/uL (ref 0.0–0.1)
Basophils Relative: 0 %
Eosinophils Absolute: 0.1 10*3/uL (ref 0.0–0.5)
Eosinophils Relative: 1 %
HCT: 37.7 % — ABNORMAL LOW (ref 39.0–52.0)
Hemoglobin: 11.8 g/dL — ABNORMAL LOW (ref 13.0–17.0)
Immature Granulocytes: 1 %
Lymphocytes Relative: 19 %
Lymphs Abs: 1.4 10*3/uL (ref 0.7–4.0)
MCH: 25.6 pg — ABNORMAL LOW (ref 26.0–34.0)
MCHC: 31.3 g/dL (ref 30.0–36.0)
MCV: 81.8 fL (ref 80.0–100.0)
Monocytes Absolute: 0.6 10*3/uL (ref 0.1–1.0)
Monocytes Relative: 8 %
Neutro Abs: 5.3 10*3/uL (ref 1.7–7.7)
Neutrophils Relative %: 71 %
Platelets: 262 10*3/uL (ref 150–400)
RBC: 4.61 MIL/uL (ref 4.22–5.81)
RDW: 16.7 % — ABNORMAL HIGH (ref 11.5–15.5)
WBC: 7.4 10*3/uL (ref 4.0–10.5)
nRBC: 0 % (ref 0.0–0.2)

## 2020-08-28 MED ORDER — SODIUM CHLORIDE 0.9 % IV SOLN
Freq: Once | INTRAVENOUS | Status: AC
  Filled 2020-08-28: qty 250

## 2020-08-28 MED ORDER — DURVALUMAB 500 MG/10ML IV SOLN
1500.0000 mg | Freq: Once | INTRAVENOUS | Status: AC
  Administered 2020-08-28: 1500 mg via INTRAVENOUS
  Filled 2020-08-28: qty 30

## 2020-08-28 MED ORDER — SODIUM CHLORIDE 0.9% FLUSH
10.0000 mL | Freq: Once | INTRAVENOUS | Status: AC
  Administered 2020-08-28: 10 mL via INTRAVENOUS
  Filled 2020-08-28: qty 10

## 2020-08-28 MED ORDER — HEPARIN SOD (PORK) LOCK FLUSH 100 UNIT/ML IV SOLN
500.0000 [IU] | Freq: Once | INTRAVENOUS | Status: AC | PRN
  Administered 2020-08-28: 500 [IU]
  Filled 2020-08-28: qty 5

## 2020-08-28 MED ORDER — HEPARIN SOD (PORK) LOCK FLUSH 100 UNIT/ML IV SOLN
INTRAVENOUS | Status: AC
  Filled 2020-08-28: qty 5

## 2020-08-28 NOTE — Progress Notes (Signed)
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  He underwent CT angios chest which did not show any PE.  Soft tissue attenuation measuring 2.9 x 4 cm centered in the left hilum resulting in abrupt angulation and narrowing of the pulmonary arteries and the central left lower lobe airways.  Additional ipsilateral hilar and subcarinal adenopathy.  No contralateral adenopathy.  Consolidative masslike opacity 3.6 x 3.1 cm in size and contiguous with more central perihilar soft tissue attenuation.  Overall findings concerning for primary bronchogenic carcinoma.    Patient lives with his sister who is his main caregiver.  He does not drive but is independent of his ADLs.  Reports ongoing fatigue and occasional retrosternal chest pain.  He has been evaluated by GI in the past for reflux as well.  Appetie is fair and weight is stable.  Denies any significant shortness of breath at this time.  Patient is an ex-smoker and smoked for about 20 years but quit smoking back in 1994   PET CT scan showed hypermetabolic mass in the left lower lobe with mediastinal adenopathy and right paratracheal adenopathy.  No evidence of distant metastatic disease.  Pathology was consistent with non-small cell lung cancer favor adenocarcinoma.  Cells were positive for TTF-1 and negative for  p40.   Patient started concurrent carbotaxol radiation and then had allergic reaction to Taxol for which she was switched to Abraxane.  Given the short supply of Abraxane patient received carbo Alimta midway through his concurrent chemoradiation.  Scans post chemoradiation showed partial response and patient will be started on maintenance durvalumab on 01/14/2020.  Patient is currently receiving durvalumab every 4 weeks      Interval history-patient reports doing well overall other than nightly itching mainly in his bilateral forearms.  Reports some exertional shortness of breath which is at his baseline and he takes inhalers for the same.  ECOG PS- 1 Pain scale- 0   Review of systems- Review of Systems  Constitutional:  Positive for malaise/fatigue. Negative for chills, fever and weight loss.  HENT:  Negative for congestion, ear discharge and nosebleeds.   Eyes:  Negative for blurred vision.  Respiratory:  Positive for shortness of breath. Negative for cough, hemoptysis, sputum production and wheezing.   Cardiovascular:  Negative for chest pain, palpitations, orthopnea and claudication.  Gastrointestinal:  Negative for abdominal pain, blood in stool, constipation, diarrhea, heartburn, melena, nausea and vomiting.  Genitourinary:  Negative for dysuria, flank pain, frequency, hematuria and urgency.  Musculoskeletal:  Negative for back pain, joint pain and myalgias.  Skin:  Negative for rash.  Neurological:  Negative for dizziness, tingling, focal weakness, seizures, weakness and headaches.  Endo/Heme/Allergies:  Does not bruise/bleed easily.  Psychiatric/Behavioral:  Negative for depression and suicidal ideas. The patient does not have insomnia.       Allergies  Allergen Reactions   Taxol [Paclitaxel] Other (See Comments)  Chest pain, hip pain, coughing , wheezing     Past Medical History:  Diagnosis Date   Dyspnea    Hyperlipidemia    Hypertension    Primary malignant  neoplasm of left lower lobe of lung [facility])      Past Surgical History:  Procedure Laterality Date   PORTA CATH INSERTION N/A 10/22/2019   Procedure: PORTA CATH INSERTION;  Surgeon: Katha Cabal, MD;  Location: [place] [facility];  Service: Cardiovascular;  Laterality: N/A;   VIDEO BRONCHOSCOPY WITH ENDOBRONCHIAL ULTRASOUND N/A 09/25/2019   Procedure: VIDEO BRONCHOSCOPY WITH ENDOBRONCHIAL ULTRASOUND;  Surgeon: Tyler Pita, MD;  Location: [facility] ORS;  Service: Pulmonary;  Laterality: N/A;    Social History   Socioeconomic History   Marital status: Single    Spouse name: Not on file   Number of children: Not on file   Years of education: Not on file   Highest education level: Not on file  Occupational History   Not on file  Tobacco Use   Smoking status: Former    Packs/day: 1.00    Years: 20.00    Pack years: 20.00    Types: Cigarettes    Quit date: 47    Years since quitting: 28.5   Smokeless tobacco: Never  Vaping Use   Vaping Use: Never used  Substance and Sexual Activity   Alcohol use: Not Currently    Comment: not drank any beer in 2 onths   Drug use: Never   Sexual activity: Not on file  Other Topics Concern   Not on file  Social History Narrative   Not on file   Social Determinants of Health   Financial Resource Strain: Not on file  Food Insecurity: Not on file  Transportation Needs: Not on file  Physical Activity: Not on file  Stress: Not on file  Social Connections: Not on file  Intimate Partner Violence: Not on file    Family History  Problem Relation Age of Onset   Cancer Brother      Current Outpatient Medications:    acetaminophen (TYLENOL) 500 MG tablet, Take 1,000 mg by mouth every 6 (six) hours as needed for mild pain, moderate pain or headache., Disp: , Rfl:    albuterol (VENTOLIN HFA) 108 (90 Base) MCG/ACT inhaler, Inhale 2 puffs into the lungs every 4 (four) hours as needed for wheezing or shortness of breath., Disp: 1 each,  Rfl: 2   ANORO ELLIPTA 62.5-25 MCG/INH AEPB, Inhale 1 puff into the lungs daily., Disp: 1 each, Rfl: 2   benzonatate (TESSALON PERLES) 100 MG capsule, Take 1 capsule (100 mg total) by mouth every 6 (six) hours as needed for cough., Disp: 90 capsule, Rfl: 0   hydrOXYzine (ATARAX/VISTARIL) 25 MG tablet, Take 25 mg by mouth daily., Disp: , Rfl:    lidocaine-prilocaine (EMLA) cream, Apply 1 application topically once., Disp: , Rfl:    oxyCODONE (OXY IR/ROXICODONE) 5 MG immediate release tablet, Take 2 tablets (10 mg total) by mouth every 4 (four) hours as needed for severe pain. (Patient not taking: No sig reported), Disp: 90 tablet, Rfl: 0   predniSONE (STERAPRED UNI-PAK 21 TAB) 10 MG (21) TBPK tablet, Take as directed. (Patient not taking: No sig reported), Disp: 21 tablet, Rfl: 0   sucralfate (CARAFATE) 1 g tablet, DISSOLVE 1 TABLET IN 3 TO 4 TABLESPOONS OF WARM WATER, THEN SWISH AND SWALLOW BY MOUTH THREE TIMES DAILY (Patient not taking: No sig reported), Disp: 90 tablet, Rfl: 1  traZODone (DESYREL) 50 MG tablet, TAKE 1 TABLET(50 MG) BY MOUTH AT BEDTIME (Patient not taking: No sig reported), Disp: 30 tablet, Rfl: 1  Physical exam:  Vitals:   08/28/20 0946  BP: (!) 142/83  Pulse: 68  Resp: 19  Temp: (!) 97 F (36.1 C)  TempSrc: Tympanic  SpO2: 97%   Physical Exam Cardiovascular:     Rate and Rhythm: Normal rate and regular rhythm.     Heart sounds: Normal heart sounds.  Pulmonary:     Effort: Pulmonary effort is normal.     Breath sounds: Normal breath sounds.  Abdominal:     General: Bowel sounds are normal.     Palpations: Abdomen is soft.  Skin:    General: Skin is warm and dry.  Neurological:     Mental Status: He is alert and oriented to person, place, and time.     CMP Latest Ref Rng & Units 08/28/2020  Glucose 70 - 99 mg/dL 125(H)  BUN 8 - 23 mg/dL 14  Creatinine 0.61 - 1.24 mg/dL 0.95  Sodium 135 - 145 mmol/L 138  Potassium 3.5 - 5.1 mmol/L 3.0(L)  Chloride 98 - 111  mmol/L 107  CO2 22 - 32 mmol/L 22  Calcium 8.9 - 10.3 mg/dL 8.5(L)  Total Protein 6.5 - 8.1 g/dL 7.0  Total Bilirubin 0.3 - 1.2 mg/dL 0.5  Alkaline Phos 38 - 126 U/L 66  AST 15 - 41 U/L 18  ALT 0 - 44 U/L 10   CBC Latest Ref Rng & Units 08/28/2020  WBC 4.0 - 10.5 K/uL 7.4  Hemoglobin 13.0 - 17.0 g/dL 11.8(L)  Hematocrit 39.0 - 52.0 % 37.7(L)  Platelets 150 - 400 K/uL 262    Assessment and plan- Patient is a 70 y.o. 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  He is here for on treatment assessment prior to cycle 14 of maintenance durvalumab  Patient is currently receiving durvalumab every 4 weeks which she will continue until November 2022.  Overall is tolerating treatment well without any significant side effects.  I will see him back in 4 weeks for cycle 15.  Plan to repeat scans in September 2022.   Visit Diagnosis 1. Malignant neoplasm of lower lobe of left lung ([place])   2. Encounter for antineoplastic immunotherapy      Dr. Randa Evens, MD, MPH [facility] at [facility] [phone] 08/28/2020 3:56 PM

## 2020-08-28 NOTE — Progress Notes (Signed)
Oncology follow up. Still has itching as soon as the sun goes down. Interferes with sleep. Itching goes away when sun comes up. Pt thinks this is from his radiation treatments. Has been going on for months now. Appetite is fair 50-60%. Dyspnea when climbs stairs. Denies pain.

## 2020-09-19 ENCOUNTER — Other Ambulatory Visit: Payer: Self-pay | Admitting: *Deleted

## 2020-09-19 DIAGNOSIS — C3432 Malignant neoplasm of lower lobe, left bronchus or lung: Secondary | ICD-10-CM

## 2020-09-25 ENCOUNTER — Other Ambulatory Visit: Payer: Self-pay

## 2020-09-25 ENCOUNTER — Inpatient Hospital Stay (HOSPITAL_BASED_OUTPATIENT_CLINIC_OR_DEPARTMENT_OTHER): Payer: Medicaid Other | Admitting: Oncology

## 2020-09-25 ENCOUNTER — Inpatient Hospital Stay: Payer: Medicaid Other | Attending: Oncology

## 2020-09-25 ENCOUNTER — Encounter: Payer: Self-pay | Admitting: Oncology

## 2020-09-25 ENCOUNTER — Inpatient Hospital Stay: Payer: Medicaid Other

## 2020-09-25 ENCOUNTER — Telehealth: Payer: Self-pay | Admitting: *Deleted

## 2020-09-25 VITALS — BP 122/71 | HR 69 | Temp 96.7°F | Resp 20 | Wt 190.9 lb

## 2020-09-25 DIAGNOSIS — Z923 Personal history of irradiation: Secondary | ICD-10-CM | POA: Insufficient documentation

## 2020-09-25 DIAGNOSIS — E785 Hyperlipidemia, unspecified: Secondary | ICD-10-CM | POA: Insufficient documentation

## 2020-09-25 DIAGNOSIS — Z5112 Encounter for antineoplastic immunotherapy: Secondary | ICD-10-CM

## 2020-09-25 DIAGNOSIS — Z7952 Long term (current) use of systemic steroids: Secondary | ICD-10-CM | POA: Diagnosis not present

## 2020-09-25 DIAGNOSIS — R053 Chronic cough: Secondary | ICD-10-CM | POA: Diagnosis not present

## 2020-09-25 DIAGNOSIS — Z79899 Other long term (current) drug therapy: Secondary | ICD-10-CM | POA: Diagnosis not present

## 2020-09-25 DIAGNOSIS — E876 Hypokalemia: Secondary | ICD-10-CM

## 2020-09-25 DIAGNOSIS — R0602 Shortness of breath: Secondary | ICD-10-CM | POA: Insufficient documentation

## 2020-09-25 DIAGNOSIS — Z9221 Personal history of antineoplastic chemotherapy: Secondary | ICD-10-CM | POA: Insufficient documentation

## 2020-09-25 DIAGNOSIS — C3432 Malignant neoplasm of lower lobe, left bronchus or lung: Secondary | ICD-10-CM

## 2020-09-25 DIAGNOSIS — Z87891 Personal history of nicotine dependence: Secondary | ICD-10-CM | POA: Diagnosis not present

## 2020-09-25 DIAGNOSIS — I10 Essential (primary) hypertension: Secondary | ICD-10-CM | POA: Insufficient documentation

## 2020-09-25 LAB — COMPREHENSIVE METABOLIC PANEL
ALT: 9 U/L (ref 0–44)
AST: 15 U/L (ref 15–41)
Albumin: 3.2 g/dL — ABNORMAL LOW (ref 3.5–5.0)
Alkaline Phosphatase: 58 U/L (ref 38–126)
Anion gap: 8 (ref 5–15)
BUN: 13 mg/dL (ref 8–23)
CO2: 24 mmol/L (ref 22–32)
Calcium: 8.4 mg/dL — ABNORMAL LOW (ref 8.9–10.3)
Chloride: 108 mmol/L (ref 98–111)
Creatinine, Ser: 0.9 mg/dL (ref 0.61–1.24)
GFR, Estimated: >60 mL/min (ref >60)
Glucose, Bld: 107 mg/dL — ABNORMAL HIGH (ref 70–99)
Potassium: 3.2 mmol/L — ABNORMAL LOW (ref 3.5–5.1)
Sodium: 140 mmol/L (ref 135–145)
Total Bilirubin: 0.8 mg/dL (ref 0.3–1.2)
Total Protein: 6.9 g/dL (ref 6.5–8.1)

## 2020-09-25 LAB — CBC WITH DIFFERENTIAL/PLATELET
Abs Immature Granulocytes: 0.02 10*3/uL (ref 0.00–0.07)
Basophils Absolute: 0 10*3/uL (ref 0.0–0.1)
Basophils Relative: 0 %
Eosinophils Absolute: 0.1 10*3/uL (ref 0.0–0.5)
Eosinophils Relative: 1 %
HCT: 34.6 % — ABNORMAL LOW (ref 39.0–52.0)
Hemoglobin: 11.2 g/dL — ABNORMAL LOW (ref 13.0–17.0)
Immature Granulocytes: 0 %
Lymphocytes Relative: 20 %
Lymphs Abs: 1.1 10*3/uL (ref 0.7–4.0)
MCH: 26.2 pg (ref 26.0–34.0)
MCHC: 32.4 g/dL (ref 30.0–36.0)
MCV: 81 fL (ref 80.0–100.0)
Monocytes Absolute: 0.6 10*3/uL (ref 0.1–1.0)
Monocytes Relative: 11 %
Neutro Abs: 3.8 10*3/uL (ref 1.7–7.7)
Neutrophils Relative %: 68 %
Platelets: 278 10*3/uL (ref 150–400)
RBC: 4.27 MIL/uL (ref 4.22–5.81)
RDW: 16.2 % — ABNORMAL HIGH (ref 11.5–15.5)
WBC: 5.6 10*3/uL (ref 4.0–10.5)
nRBC: 0 % (ref 0.0–0.2)

## 2020-09-25 LAB — TSH: TSH: 1.678 u[IU]/mL (ref 0.350–4.500)

## 2020-09-25 MED ORDER — SODIUM CHLORIDE 0.9% FLUSH
10.0000 mL | Freq: Once | INTRAVENOUS | Status: AC
Start: 2020-09-25 — End: 2020-09-25
  Administered 2020-09-25: 10 mL via INTRAVENOUS
  Filled 2020-09-25: qty 10

## 2020-09-25 MED ORDER — HEPARIN SOD (PORK) LOCK FLUSH 100 UNIT/ML IV SOLN
500.0000 [IU] | Freq: Once | INTRAVENOUS | Status: DC
  Filled 2020-09-25: qty 5

## 2020-09-25 MED ORDER — SODIUM CHLORIDE 0.9 % IV SOLN
Freq: Once | INTRAVENOUS | Status: AC
  Filled 2020-09-25: qty 250

## 2020-09-25 MED ORDER — POTASSIUM CHLORIDE CRYS ER 20 MEQ PO TBCR: 20.0000 meq | EXTENDED_RELEASE_TABLET | Freq: Two times a day (BID) | ORAL | 1 refills | Status: DC

## 2020-09-25 MED ORDER — SODIUM CHLORIDE 0.9 % IV SOLN
1500.0000 mg | Freq: Once | INTRAVENOUS | Status: AC
  Administered 2020-09-25: 1500 mg via INTRAVENOUS
  Filled 2020-09-25: qty 30

## 2020-09-25 MED ORDER — HEPARIN SOD (PORK) LOCK FLUSH 100 UNIT/ML IV SOLN
INTRAVENOUS | Status: AC
  Filled 2020-09-25: qty 5

## 2020-09-25 MED ORDER — BENZONATATE 100 MG PO CAPS: 100.0000 mg | ORAL_CAPSULE | Freq: Four times a day (QID) | ORAL | 2 refills | Status: DC | PRN

## 2020-09-25 MED ORDER — HEPARIN SOD (PORK) LOCK FLUSH 100 UNIT/ML IV SOLN
500.0000 [IU] | Freq: Once | INTRAVENOUS | Status: AC | PRN
  Administered 2020-09-25: 500 [IU]
  Filled 2020-09-25: qty 5

## 2020-09-25 NOTE — Progress Notes (Signed)
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  He underwent CT angios chest which did not show any PE.  Soft tissue attenuation measuring 2.9 x 4 cm centered in the left hilum resulting in abrupt angulation and narrowing of the pulmonary arteries and the central left lower lobe airways.  Additional ipsilateral hilar and subcarinal adenopathy.  No contralateral adenopathy.  Consolidative masslike opacity 3.6 x 3.1 cm in size and contiguous with more central perihilar soft tissue attenuation.  Overall findings concerning for primary bronchogenic carcinoma.    Patient lives with his sister who is his main caregiver.  He does not drive but is independent of his ADLs.  Reports ongoing fatigue and occasional retrosternal chest pain.  He has been evaluated by GI in the past for reflux as well.  Appetie is fair and weight is stable.  Denies any significant shortness of breath at this time.  Patient is an ex-smoker and smoked for about 20 years but quit smoking back in 1994   PET CT scan showed hypermetabolic mass in the left lower lobe with mediastinal adenopathy and right paratracheal adenopathy.  No evidence of distant metastatic disease.  Pathology was consistent with non-small cell lung cancer favor adenocarcinoma.  Cells were positive for TTF-1 and negative for  p40.   Patient started concurrent carbotaxol radiation and then had allergic reaction to Taxol for which she was switched to Abraxane.  Given the short supply of Abraxane patient received carbo Alimta midway through his concurrent chemoradiation.  Scans post chemoradiation showed partial response and patient will be started on maintenance durvalumab on 01/14/2020.  Patient is currently receiving durvalumab every 4 weeks      Interval history-patient reports that his itching which mainly happens at night is significantly better but has still not gone away completely.  He still has some persistent cough for which she uses Tessalon Perles  ECOG PS- 1 Pain scale- 0   Review of systems- Review of Systems  Constitutional:  Negative for chills, fever, malaise/fatigue and weight loss.  HENT:  Negative for congestion, ear discharge and nosebleeds.   Eyes:  Negative for blurred vision.  Respiratory:  Positive for cough. Negative for hemoptysis, sputum production, shortness of breath and wheezing.   Cardiovascular:  Negative for chest pain, palpitations, orthopnea and claudication.  Gastrointestinal:  Negative for abdominal pain, blood in stool, constipation, diarrhea, heartburn, melena, nausea and vomiting.  Genitourinary:  Negative for dysuria, flank pain, frequency, hematuria and urgency.  Musculoskeletal:  Negative for back pain, joint pain and myalgias.  Skin:  Positive for itching. Negative for rash.  Neurological:  Negative for dizziness, tingling, focal weakness, seizures, weakness and headaches.  Endo/Heme/Allergies:  Does not bruise/bleed easily.  Psychiatric/Behavioral:  Negative for depression and suicidal ideas. The patient does not have insomnia.      Allergies  Allergen Reactions   Taxol [Paclitaxel] Other (See Comments)  Chest pain, hip pain, coughing , wheezing     Past Medical History:  Diagnosis Date   Dyspnea    Hyperlipidemia    Hypertension    Primary malignant  neoplasm of left lower lobe of lung [facility])      Past Surgical History:  Procedure Laterality Date   PORTA CATH INSERTION N/A 10/22/2019   Procedure: PORTA CATH INSERTION;  Surgeon: Katha Cabal, MD;  Location: [facility];  Service: Cardiovascular;  Laterality: N/A;   VIDEO BRONCHOSCOPY WITH ENDOBRONCHIAL ULTRASOUND N/A 09/25/2019   Procedure: VIDEO BRONCHOSCOPY WITH ENDOBRONCHIAL ULTRASOUND;  Surgeon: Tyler Pita, MD;  Location: [facility];  Service: Pulmonary;  Laterality: N/A;    Social History   Socioeconomic History   Marital status: Single    Spouse name: Not on file   Number of children: Not on file   Years of education: Not on file   Highest education level: Not on file  Occupational History   Not on file  Tobacco Use   Smoking status: Former    Packs/day: 1.00    Years: 20.00    Pack years: 20.00    Types: Cigarettes    Quit date: 47    Years since quitting: 28.6   Smokeless tobacco: Never  Vaping Use   Vaping Use: Never used  Substance and Sexual Activity   Alcohol use: Not Currently    Comment: not drank any beer in 2 onths   Drug use: Never   Sexual activity: Not on file  Other Topics Concern   Not on file  Social History Narrative   Not on file   Social Determinants of Health   Financial Resource Strain: Not on file  Food Insecurity: Not on file  Transportation Needs: Not on file  Physical Activity: Not on file  Stress: Not on file  Social Connections: Not on file  Intimate Partner Violence: Not on file    Family History  Problem Relation Age of Onset   Cancer Brother      Current Outpatient Medications:    albuterol (VENTOLIN HFA) 108 (90 Base) MCG/ACT inhaler, Inhale 2 puffs into the lungs every 4 (four) hours as needed for wheezing or shortness of breath., Disp: 1 each, Rfl: 2   ANORO ELLIPTA 62.5-25 MCG/INH AEPB, Inhale 1 puff into the lungs daily., Disp: 1 each, Rfl: 2   hydrOXYzine (ATARAX/VISTARIL) 25 MG tablet, Take  25 mg by mouth daily., Disp: , Rfl:    lidocaine-prilocaine (EMLA) cream, Apply 1 application topically once., Disp: , Rfl:    acetaminophen (TYLENOL) 500 MG tablet, Take 1,000 mg by mouth every 6 (six) hours as needed for mild pain, moderate pain or headache. (Patient not taking: Reported on 09/25/2020), Disp: , Rfl:    benzonatate (TESSALON PERLES) 100 MG capsule, Take 1 capsule (100 mg total) by mouth every 6 (six) hours as needed for cough., Disp: 90 capsule, Rfl: 2   oxyCODONE (OXY IR/ROXICODONE) 5 MG immediate release tablet, Take 2 tablets (10 mg total) by mouth every 4 (four) hours as needed for severe pain. (Patient not taking: No sig reported), Disp: 90 tablet, Rfl: 0   potassium chloride SA (KLOR-CON) 20 MEQ tablet, Take 1 tablet (20 mEq total) by mouth 2 (two) times daily., Disp: 30 tablet, Rfl: 1   predniSONE (STERAPRED UNI-PAK 21 TAB) 10 MG (21) TBPK tablet, Take as directed. (Patient not taking: No sig reported), Disp: 21 tablet, Rfl: 0   sucralfate (CARAFATE) 1 g tablet, DISSOLVE  1 TABLET IN 3 TO 4 TABLESPOONS OF WARM WATER, THEN SWISH AND SWALLOW BY MOUTH THREE TIMES DAILY (Patient not taking: No sig reported), Disp: 90 tablet, Rfl: 1   traZODone (DESYREL) 50 MG tablet, TAKE 1 TABLET(50 MG) BY MOUTH AT BEDTIME (Patient not taking: No sig reported), Disp: 30 tablet, Rfl: 1 No current facility-administered medications for this visit.  Facility-Administered Medications Ordered in Other Visits:    heparin lock flush 100 UNIT/ML injection, , , ,    heparin lock flush 100 unit/mL, 500 Units, Intravenous, Once, Sindy Guadeloupe, MD  Physical exam:  Vitals:   09/25/20 0900  BP: 122/71  Pulse: 69  Resp: 20  Temp: (!) 96.7 F (35.9 C)  TempSrc: Tympanic  SpO2: 96%  Weight: 190 lb 14.4 oz (86.6 kg)   Physical Exam Constitutional:      General: He is not in acute distress. HENT:     Mouth/Throat:     Mouth: Mucous membranes are moist.     Pharynx: Oropharynx is clear.   Cardiovascular:     Rate and Rhythm: Normal rate and regular rhythm.     Heart sounds: Normal heart sounds.  Pulmonary:     Effort: Pulmonary effort is normal.     Breath sounds: Normal breath sounds.  Abdominal:     General: Bowel sounds are normal.     Palpations: Abdomen is soft.  Musculoskeletal:     Cervical back: Normal range of motion.  Skin:    General: Skin is warm and dry.  Neurological:     Mental Status: He is alert and oriented to person, place, and time.     CMP Latest Ref Rng & Units 09/25/2020  Glucose 70 - 99 mg/dL 107(H)  BUN 8 - 23 mg/dL 13  Creatinine 0.61 - 1.24 mg/dL 0.90  Sodium 135 - 145 mmol/L 140  Potassium 3.5 - 5.1 mmol/L 3.2(L)  Chloride 98 - 111 mmol/L 108  CO2 22 - 32 mmol/L 24  Calcium 8.9 - 10.3 mg/dL 8.4(L)  Total Protein 6.5 - 8.1 g/dL 6.9  Total Bilirubin 0.3 - 1.2 mg/dL 0.8  Alkaline Phos 38 - 126 U/L 58  AST 15 - 41 U/L 15  ALT 0 - 44 U/L 9   CBC Latest Ref Rng & Units 09/25/2020  WBC 4.0 - 10.5 K/uL 5.6  Hemoglobin 13.0 - 17.0 g/dL 11.2(L)  Hematocrit 39.0 - 52.0 % 34.6(L)  Platelets 150 - 400 K/uL 278      Assessment and plan- Patient is a 70 y.o. 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  He is here for on treatment assessment prior to cycle 15 of maintenance durvalumab  Counts okay to proceed with cycle 15 of durvalumab today.  I will see him back in 4 weeks for cycle 16.  Plan is to continue treatment until November 2020.  He will complete 1 year of adjuvant treatment.  Overall he is tolerating treatment well without any significant side effects.  Hypokalemia: We will renew oral potassium   Visit Diagnosis 1. Encounter for antineoplastic immunotherapy   2. Malignant neoplasm of lower lobe of left lung ([place])   3. Hypokalemia      Dr. Randa Evens, MD, MPH [facility] at [facility] [phone] 09/25/2020 4:12 PM

## 2020-09-25 NOTE — Patient Instructions (Signed)
[facility]  Discharge Instructions: Thank you for choosing [facility] to provide your oncology and hematology care.  If you have a lab appointment with the [facility], please go directly to the [facility] and check in at the registration area.  Wear comfortable clothing and clothing appropriate for easy access to any Portacath or PICC line.   We strive to give you quality time with your provider. You may need to reschedule your appointment if you arrive late (15 or more minutes).  Arriving late affects you and other patients whose appointments are after yours.  Also, if you miss three or more appointments without notifying the office, you may be dismissed from the clinic at the provider's discretion.      For prescription refill requests, have your pharmacy contact our office and allow 72 hours for refills to be completed.    Today you received the following chemotherapy and/or immunotherapy agents Imfinzi       To help prevent nausea and vomiting after your treatment, we encourage you to take your nausea medication as directed.  BELOW ARE SYMPTOMS THAT SHOULD BE REPORTED IMMEDIATELY: *FEVER GREATER THAN 100.4 F (38 C) OR HIGHER *CHILLS OR SWEATING *NAUSEA AND VOMITING THAT IS NOT CONTROLLED WITH YOUR NAUSEA MEDICATION *UNUSUAL SHORTNESS OF BREATH *UNUSUAL BRUISING OR BLEEDING *URINARY PROBLEMS (pain or burning when urinating, or frequent urination) *BOWEL PROBLEMS (unusual diarrhea, constipation, pain near the anus) TENDERNESS IN MOUTH AND THROAT WITH OR WITHOUT PRESENCE OF ULCERS (sore throat, sores in mouth, or a toothache) UNUSUAL RASH, SWELLING OR PAIN  UNUSUAL VAGINAL DISCHARGE OR ITCHING   Items with * indicate a potential emergency and should be followed up as soon as possible or go to the Emergency Department if any problems should occur.  Please show the CHEMOTHERAPY ALERT CARD or IMMUNOTHERAPY ALERT CARD at check-in  to the Emergency Department and triage nurse.  Should you have questions after your visit or need to cancel or reschedule your appointment, please contact [facility]  [phone] and follow the prompts.  Office hours are 8:00 a.m. to 4:30 p.m. Monday - Friday. Please note that voicemails left after 4:00 p.m. may not be returned until the following business day.  We are closed weekends and major holidays. You have access to a nurse at all times for urgent questions. Please call the main number to the clinic [phone] and follow the prompts.  For any non-urgent questions, you may also contact your provider using MyChart. We now offer e-Visits for anyone 66 and older to request care online for non-urgent symptoms. For details visit [email or web address].   Also download the MyChart app! Go to the app store, search "MyChart", open the app, select [facility], and log in with your MyChart username and password.  Due to Covid, a mask is required upon entering the hospital/clinic. If you do not have a mask, one will be given to you upon arrival. For doctor visits, patients may have 1 support person aged 3 or older with them. For treatment visits, patients cannot have anyone with them due to current Covid guidelines and our immunocompromised population.

## 2020-09-25 NOTE — Telephone Encounter (Signed)
Patient's sister called to report that the medication Benzonatate is not covered by insurance. She would like advice on a replacement if possible.

## 2020-09-30 ENCOUNTER — Other Ambulatory Visit: Payer: Self-pay | Admitting: Oncology

## 2020-10-01 ENCOUNTER — Other Ambulatory Visit: Payer: Self-pay | Admitting: *Deleted

## 2020-10-01 NOTE — Telephone Encounter (Signed)
Have called the patient's sister to let her know that I have asked Dr. Janese Banks about a different cough medicine because the insurance will no longer cover the [facility]  Dr. Janese Banks said we can try some Hydromet.  It requires her to do a fingerprint so will be sent into the pharmacy until later this evening.  Then pharmacy will have to look into it and see whether or not it is a covered med or not.  We will get back with her as soon as we find out.  The sister is wondering whether or not she should switch to from [place] [employer] to a plan that starts with well and she supposed to call me back and tell me the complete name so she can determine whether or not it would be good for him to change from [place] [employer] to the other one.  I gave her my direct number to call me back at

## 2020-10-02 ENCOUNTER — Encounter: Payer: Self-pay | Admitting: Oncology

## 2020-10-02 MED ORDER — HYDROCODONE BIT-HOMATROP MBR 5-1.5 MG/5ML PO SOLN: 5.0000 mL | Freq: Four times a day (QID) | ORAL | 0 refills | Status: DC | PRN

## 2020-10-08 ENCOUNTER — Encounter: Payer: Self-pay | Admitting: Oncology

## 2020-10-13 ENCOUNTER — Telehealth: Payer: Self-pay | Admitting: *Deleted

## 2020-10-13 NOTE — Telephone Encounter (Signed)
I called the pt and he has said that he cooled down now, feeling better. He does say that he was getting got and  then having cold chills. He does not have thermometer to check. His sister is out of town for 2 weeks. He feels like chest cold. I told him that if he starts feeling bad again he should call ems. He has no way to get to pharmacy or [place] health dept. To be tested for covid. I checked with supervisor we should not transport people when they have symptoms of covid. He is feeling better and I think that is great and I will call him in am but anything happens overnight with his breathing or fever and chilss he should call EMS. He understands

## 2020-10-13 NOTE — Telephone Encounter (Signed)
Sister Margaretha Sheffield called reporting that patient called her reporting that his breathing was worse and she was asking if he needed to go to ER or come to ourr office. She also reported that he has no transportation to get anywhere. I advised her that if he is struggling to breath and feels he cannot get his breath that he should call EMS and go to ER, or if he was just feeling more shortness of breath without struggling to breath that he could be seen in office but transportation might be an issue. She will advise him to call EMS

## 2020-10-14 NOTE — Telephone Encounter (Signed)
Called to follow-up with patient. He stated his symptoms have not improved. He is still having difficulty breathing and cough. He states its worse at night. Encouraged patient to call 911 to get EMT to evaluate patient since he has no transportation. Patient declines, states that he will call when he feels worse. Patient states his sister will come back in town Tuesday and he may wait until then otherwise no other transportation. RN again encourages patient to call ambulance and call client with any other concerns.

## 2020-10-20 ENCOUNTER — Emergency Department
Admission: EM | Admit: 2020-10-20 | Discharge: 2020-10-20 | Disposition: A | Discharge status: Home / Self Care | Payer: Medicaid Other | Attending: Emergency Medicine | Admitting: Emergency Medicine

## 2020-10-20 ENCOUNTER — Emergency Department: Payer: Medicaid Other

## 2020-10-20 ENCOUNTER — Telehealth: Payer: Self-pay | Admitting: *Deleted

## 2020-10-20 ENCOUNTER — Other Ambulatory Visit: Payer: Self-pay

## 2020-10-20 DIAGNOSIS — R0602 Shortness of breath: Secondary | ICD-10-CM | POA: Diagnosis present

## 2020-10-20 DIAGNOSIS — R0789 Other chest pain: Secondary | ICD-10-CM

## 2020-10-20 DIAGNOSIS — U071 COVID-19: Secondary | ICD-10-CM | POA: Diagnosis not present

## 2020-10-20 DIAGNOSIS — Z87891 Personal history of nicotine dependence: Secondary | ICD-10-CM | POA: Insufficient documentation

## 2020-10-20 DIAGNOSIS — I10 Essential (primary) hypertension: Secondary | ICD-10-CM | POA: Insufficient documentation

## 2020-10-20 DIAGNOSIS — Z85118 Personal history of other malignant neoplasm of bronchus and lung: Secondary | ICD-10-CM | POA: Diagnosis not present

## 2020-10-20 LAB — BASIC METABOLIC PANEL
Anion gap: 9 (ref 5–15)
BUN: 15 mg/dL (ref 8–23)
CO2: 22 mmol/L (ref 22–32)
Calcium: 8.9 mg/dL (ref 8.9–10.3)
Chloride: 105 mmol/L (ref 98–111)
Creatinine, Ser: 0.78 mg/dL (ref 0.61–1.24)
GFR, Estimated: >60 mL/min (ref >60)
Glucose, Bld: 128 mg/dL — ABNORMAL HIGH (ref 70–99)
Potassium: 3.5 mmol/L (ref 3.5–5.1)
Sodium: 136 mmol/L (ref 135–145)

## 2020-10-20 LAB — TROPONIN I (HIGH SENSITIVITY)
Troponin I (High Sensitivity): 8 ng/L (ref <18)
Troponin I (High Sensitivity): 8 ng/L (ref <18)

## 2020-10-20 LAB — CBC
HCT: 33.2 % — ABNORMAL LOW (ref 39.0–52.0)
Hemoglobin: 11.1 g/dL — ABNORMAL LOW (ref 13.0–17.0)
MCH: 25.9 pg — ABNORMAL LOW (ref 26.0–34.0)
MCHC: 33.4 g/dL (ref 30.0–36.0)
MCV: 77.4 fL — ABNORMAL LOW (ref 80.0–100.0)
Platelets: 375 10*3/uL (ref 150–400)
RBC: 4.29 MIL/uL (ref 4.22–5.81)
RDW: 16.7 % — ABNORMAL HIGH (ref 11.5–15.5)
WBC: 8.4 10*3/uL (ref 4.0–10.5)
nRBC: 0 % (ref 0.0–0.2)

## 2020-10-20 LAB — BRAIN NATRIURETIC PEPTIDE: B Natriuretic Peptide: 28.6 pg/mL (ref 0.0–100.0)

## 2020-10-20 LAB — RESP PANEL BY RT-PCR (FLU A&B, COVID) ARPGX2
Influenza A by PCR: NEGATIVE
Influenza B by PCR: NEGATIVE
SARS Coronavirus 2 by RT PCR: POSITIVE — AB

## 2020-10-20 MED ORDER — ALBUTEROL SULFATE HFA 108 (90 BASE) MCG/ACT IN AERS: 2.0000 | INHALATION_SPRAY | RESPIRATORY_TRACT | 1 refills | Status: DC | PRN

## 2020-10-20 MED ORDER — NIRMATRELVIR/RITONAVIR (PAXLOVID)TABLET: 3.0000 | ORAL_TABLET | Freq: Two times a day (BID) | ORAL | 0 refills | Status: AC

## 2020-10-20 MED ORDER — IPRATROPIUM-ALBUTEROL 0.5-2.5 (3) MG/3ML IN SOLN
3.0000 mL | Freq: Once | RESPIRATORY_TRACT | Status: AC
  Administered 2020-10-20: 3 mL via RESPIRATORY_TRACT
  Filled 2020-10-20: qty 3

## 2020-10-20 MED ORDER — DOXYCYCLINE HYCLATE 100 MG PO CAPS: 100.0000 mg | ORAL_CAPSULE | Freq: Two times a day (BID) | ORAL | 0 refills | Status: AC

## 2020-10-20 MED ORDER — IOHEXOL 350 MG/ML SOLN
75.0000 mL | Freq: Once | INTRAVENOUS | Status: AC | PRN
  Administered 2020-10-20: 75 mL via INTRAVENOUS

## 2020-10-20 MED ORDER — OXYCODONE-ACETAMINOPHEN 5-325 MG PO TABS
2.0000 | ORAL_TABLET | Freq: Once | ORAL | Status: AC
  Administered 2020-10-20: 2 via ORAL
  Filled 2020-10-20: qty 2

## 2020-10-20 MED ORDER — PREDNISONE 20 MG PO TABS: 40.0000 mg | ORAL_TABLET | Freq: Every day | ORAL | 0 refills | Status: AC

## 2020-10-20 NOTE — ED Notes (Signed)
While ambulating pt sats dropped to 93%, while getting pt into bed sats dropped to 91%; pt denies feeling SOB. Rush Landmark, RN and Ellender Hose, MD notified. Pt placed back on card monitor.

## 2020-10-20 NOTE — ED Triage Notes (Signed)
Pt presents to ED with c/o of SOB for the past month. Pt states I just feel '"rough". Pt states L sided CP, pt denies radiation of this pain. Pt states HX of lung cancer. Pt appears to not be able to take a full deep breath. Pt denies fevers or chills.

## 2020-10-20 NOTE — ED Triage Notes (Signed)
First Nurse Note:  C/O SOB x 1 month.  Has history of lung CA.  Also c/o right sided chest pain.  AAOx3.  No SOB/ DOE.  NAD

## 2020-10-20 NOTE — ED Provider Notes (Signed)
[facility] Emergency Department Provider Note  ____________________________________________   Event Date/Time   First MD Initiated Contact with Patient 10/20/20 1429     (approximate)  I have reviewed the triage vital signs and the nursing notes.   HISTORY  Chief Complaint Chest Pain and Shortness of Breath    HPI Mitchell Frye is a 70 y.o. male with past medical history as below including history of hypertension, hyperlipidemia, fairly recently diagnosed left lung mass, on radiation, here with shortness of breath.  The patient states that for the last several days to weeks, he has had progressive worsening dry cough.  He has had some mild shortness of breath, particularly with exertion.  No chest pain.  He states his cough has become slightly more productive recently, and he has had some mild chest pain after coughing.  The pain is primarily anterior chest.  No specific alleviating or aggravating factors other than coughing.  No fevers.  No chills.  No leg swelling.  No recent change to his radiation regimen.  Denies any worsening of his pain with eating or drinking.    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   B12 deficiency 11/04/2019   Goals of care, counseling/discussion 10/04/2019   Malignant neoplasm of lower lobe of left lung ([place]) 10/04/2019    Past Surgical History:  Procedure Laterality Date   PORTA CATH INSERTION N/A 10/22/2019   Procedure: PORTA CATH INSERTION;  Surgeon: Katha Cabal, MD;  Location: [facility];  Service: Cardiovascular;  Laterality: N/A;   VIDEO BRONCHOSCOPY WITH ENDOBRONCHIAL ULTRASOUND N/A 09/25/2019   Procedure: VIDEO BRONCHOSCOPY WITH ENDOBRONCHIAL ULTRASOUND;  Surgeon: Tyler Pita, MD;  Location: [facility]  ORS;  Service: Pulmonary;  Laterality: N/A;    Prior to Admission medications   Medication Sig Start Date End Date Taking? Authorizing Provider  albuterol (VENTOLIN HFA) 108 (90 Base) MCG/ACT inhaler Inhale 2 puffs into the lungs every 4 (four) hours as needed for wheezing or shortness of breath. 10/20/20  Yes Duffy Bruce, MD  doxycycline (VIBRAMYCIN) 100 MG capsule Take 1 capsule (100 mg total) by mouth 2 (two) times daily for 7 days. 10/20/20 10/27/20 Yes Duffy Bruce, MD  nirmatrelvir/ritonavir EUA (PAXLOVID) 20 x 150 MG & 10 x 100MG  TABS Take 3 tablets by mouth 2 (two) times daily for 5 days. Patient GFR is >60 . Take nirmatrelvir (150 mg) two tablets twice daily for 5 days and ritonavir (100 mg) one tablet twice daily for 5 days. 10/20/20 10/25/20 Yes Duffy Bruce, MD  predniSONE (DELTASONE) 20 MG tablet Take 2 tablets (40 mg total) by mouth daily for 5 days. 10/20/20 10/25/20 Yes Duffy Bruce, MD  acetaminophen (TYLENOL) 500 MG tablet Take 1,000 mg by mouth every 6 (six) hours as needed for mild pain, moderate pain or headache. Patient not taking: Reported on 09/25/2020    [provider]  Celedonio Miyamoto 62.5-25 MCG/INH AEPB INHALE 1 PUFF INTO THE LUNGS DAILY 09/30/20   Sindy Guadeloupe, MD  benzonatate (TESSALON PERLES) 100 MG capsule Take 1 capsule (100 mg total) by mouth every 6 (six) hours as needed for cough. 09/25/20 09/25/21  Sindy Guadeloupe, MD  HYDROcodone bit-homatropine (HYDROMET) 5-1.5 MG/5ML syrup Take 5 mLs by mouth every  6 (six) hours as needed for cough. 10/02/20   Sindy Guadeloupe, MD  hydrOXYzine (ATARAX/VISTARIL) 25 MG tablet Take 25 mg by mouth daily. 06/11/20   [provider]  lidocaine-prilocaine (EMLA) cream Apply 1 application topically once. 12/18/19   [provider]  oxyCODONE (OXY IR/ROXICODONE) 5 MG immediate release tablet Take 2 tablets (10 mg total) by mouth every 4 (four) hours as needed for severe pain. Patient not taking: No sig reported  12/02/19   Verlon Au, NP  potassium chloride SA (KLOR-CON) 20 MEQ tablet Take 1 tablet (20 mEq total) by mouth 2 (two) times daily. 09/25/20   Sindy Guadeloupe, MD  sucralfate (CARAFATE) 1 g tablet DISSOLVE 1 TABLET IN 3 TO 4 TABLESPOONS OF WARM WATER, THEN SWISH AND SWALLOW BY MOUTH THREE TIMES DAILY Patient not taking: No sig reported 02/26/20   Noreene Filbert, MD  traZODone (DESYREL) 50 MG tablet TAKE 1 TABLET(50 MG) BY MOUTH AT BEDTIME Patient not taking: No sig reported 04/23/20   Sindy Guadeloupe, MD  prochlorperazine (COMPAZINE) 10 MG tablet TAKE 1 TABLET(10 MG) BY MOUTH EVERY 6 HOURS AS NEEDED FOR NAUSEA OR VOMITING 10/22/19 11/25/19  Sindy Guadeloupe, MD    Allergies Taxol [paclitaxel]  Family History  Problem Relation Age of Onset   Cancer Brother     Social History Social History   Tobacco Use   Smoking status: Former    Packs/day: 1.00    Years: 20.00    Pack years: 20.00    Types: Cigarettes    Quit date: 1994    Years since quitting: 28.7   Smokeless tobacco: Never  Vaping Use   Vaping Use: Never used  Substance Use Topics   Alcohol use: Not Currently    Comment: not drank any beer in 2 onths   Drug use: Never    Review of Systems  Review of Systems  Constitutional:  Positive for fatigue. Negative for chills and fever.  HENT:  Negative for sore throat.   Respiratory:  Positive for cough and shortness of breath.   Cardiovascular:  Positive for chest pain.  Gastrointestinal:  Negative for abdominal pain.  Genitourinary:  Negative for flank pain.  Musculoskeletal:  Negative for neck pain.  Skin:  Negative for rash and wound.  Allergic/Immunologic: Negative for immunocompromised state.  Neurological:  Negative for weakness and numbness.  Hematological:  Does not bruise/bleed easily.  All other systems reviewed and are negative.   ____________________________________________  PHYSICAL EXAM:      VITAL SIGNS: ED Triage Vitals [10/20/20 1313]  Enc Vitals  Group     BP (!) 129/96     Pulse Rate 93     Resp 20     Temp 98.6 F (37 C)     Temp Source Oral     SpO2 97 %     Weight      Height      Head Circumference      Peak Flow      Pain Score 6     Pain Loc      Pain Edu?      Excl. in [place]?      Physical Exam Vitals and nursing note reviewed.  Constitutional:      General: He is not in acute distress.    Appearance: He is well-developed.  HENT:     Head: Normocephalic and atraumatic.  Eyes:     Conjunctiva/sclera: Conjunctivae normal.  Cardiovascular:  Rate and Rhythm: Normal rate and regular rhythm.     Heart sounds: Normal heart sounds. No murmur heard.   No friction rub.  Pulmonary:     Effort: Pulmonary effort is normal. No respiratory distress.     Breath sounds: Normal breath sounds. No wheezing (Mild, expiratory only, normal work of breathing) or rales.  Abdominal:     General: There is no distension.     Palpations: Abdomen is soft.     Tenderness: There is no abdominal tenderness.  Musculoskeletal:     Cervical back: Neck supple.  Skin:    General: Skin is warm.     Capillary Refill: Capillary refill takes less than 2 seconds.  Neurological:     Mental Status: He is alert and oriented to person, place, and time.     Motor: No abnormal muscle tone.      ____________________________________________   LABS (all labs ordered are listed, but only abnormal results are displayed)  Labs Reviewed  RESP PANEL BY RT-PCR (FLU A&B, COVID) ARPGX2 - Abnormal; Notable for the following components:      Result Value   SARS Coronavirus 2 by RT PCR POSITIVE (*)    All other components within normal limits  BASIC METABOLIC PANEL - Abnormal; Notable for the following components:   Glucose, Bld 128 (*)    All other components within normal limits  CBC - Abnormal; Notable for the following components:   Hemoglobin 11.1 (*)    HCT 33.2 (*)    MCV 77.4 (*)    MCH 25.9 (*)    RDW 16.7 (*)    All other components  within normal limits  BRAIN NATRIURETIC PEPTIDE  TROPONIN I (HIGH SENSITIVITY)  TROPONIN I (HIGH SENSITIVITY)    ____________________________________________  EKG: Normal sinus rhythm, VR 94. PR 160, QRS 86, QTc 415. No acute St elevations or depressions. No ischemia or infarct. ________________________________________  RADIOLOGY All imaging, including plain films, CT scans, and ultrasounds, independently reviewed by me, and interpretations confirmed via formal radiology reads.  ED MD interpretation:   CXR: Left lung infrahilar opacity, correlating with prior lung mass CT Angio: No PE, no acute intrathoracic abnromality  Official radiology report(s): DG Chest 2 View  Result Date: 10/20/2020 CLINICAL DATA:  Shortness of breath, history of lung cancer EXAM: CHEST - 2 VIEW COMPARISON:  09/02/2019 chest radiograph, 07/17/2020 CT chest. FINDINGS: Right chest port with catheter tip overlying the superior cavoatrial junction. The heart size and mediastinal contours are within normal limits. Opacity in the infrahilar left lung, in the location of the previously noted left lung mass, with increased surrounding linear opacities compared to the prior radiograph, which likely correlate with posttreatment fibrotic changes on the prior CT. No additional pulmonary opacity. No pleural effusion or pneumothorax. The visualized skeletal structures are unremarkable. IMPRESSION: 1. Left lung infrahilar opacity, which correlates with the previously noted left lung mass mass and associated post treatment changes. This could be better evaluated with CT if clinically indicated. 2. No acute cardiopulmonary process. Electronically Signed   By: Merilyn Baba M.D.   On: 10/20/2020 14:46   CT Angio Chest PE W and/or Wo Contrast  Result Date: 10/20/2020 CLINICAL DATA:  70 year old male with shortness of breath, concern for pulmonary embolism. History of left lower lobe lung mass. EXAM: CT ANGIOGRAPHY CHEST WITH CONTRAST  TECHNIQUE: Multidetector CT imaging of the chest was performed using the standard protocol during bolus administration of intravenous contrast. Multiplanar CT image reconstructions and MIPs were  obtained to evaluate the vascular anatomy. CONTRAST:  Seventy-five mL Omnipaque 350, intravenous COMPARISON:  07/17/2020 FINDINGS: Cardiovascular: Satisfactory opacification of the pulmonary arteries to the segmental level. No evidence of pulmonary embolism. Mild global cardiomegaly. Scattered atherosclerotic calcifications of the thoracic aorta. No pericardial effusion. Right chest wall, IJ approach Port-A-Cath in place with the catheter tip near the cavoatrial junction. Mediastinum/Nodes: No enlarged mediastinal, hilar, or axillary lymph nodes. Thyroid gland, trachea, and esophagus demonstrate no significant findings. Lungs/Pleura: Interval decreased peribronchovascular consolidative prominence about the superior segment of the left lower lobe, now difficult to measure. There persistent bandlike opacities throughout the medial aspect of the lingula and left upper lobe as well as the left lower lobe. Mild bibasilar subsegmental atelectasis. No new focal consolidations. No pleural effusion or pneumothorax. Upper Abdomen: Mildly decreased attenuation of the visualized hepatic parenchyma. No acute abnormality. Musculoskeletal: No chest wall abnormality. No acute or significant osseous findings. Review of the MIP images confirms the above findings. IMPRESSION: Vascular: 1. No evidence of pulmonary embolism. 2. Mild global cardiomegaly. 3.  Aortic Atherosclerosis (ICD10-I70.0). Non-Vascular: 1. No acute intrathoracic abnormality. 2. 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 Ruthann Cancer, MD [facility] Radiology Electronically Signed   By: Ruthann Cancer  M.D.   On: 10/20/2020 16:08    ____________________________________________  PROCEDURES   Procedure(s) performed (including Critical Care):  Procedures  ____________________________________________  INITIAL IMPRESSION / MDM / [place] / ED COURSE  As part of my medical decision making, I reviewed the following data within the [place] notes reviewed and incorporated, Old chart reviewed, Notes from prior ED visits, and [place] Controlled Substance Database       *Mitchell Frye was evaluated in Emergency Department on 10/21/2020 for the symptoms described in the history of present illness. He was evaluated in the context of the global COVID-19 pandemic, which necessitated consideration that the patient might be at risk for infection with the SARS-CoV-2 virus that causes COVID-19. Institutional protocols and algorithms that pertain to the evaluation of patients at risk for COVID-19 are in a state of rapid change based on information released by regulatory bodies including the CDC and federal and state organizations. These policies and algorithms were followed during the patient's care in the ED.  Some ED evaluations and interventions may be delayed as a result of limited staffing during the pandemic.*     Medical Decision Making:  Well appearing 70 yo M here with atypical chest pain, SOB. On arrival, pt is overall well appearing and in NAD. He has mild wheezing on exam and had excellent response to duonebs, with resolution of his sx. Suspect COPD exacerbation with mild bronchitis. Given his extensive history, work-up obtained as above. CXR unremarkable. Given +CP, pleuritic component and h/o cancer, CT angio obtained, and fortunately shows no PE, PNA, or other abnormality. Labs are overall very reassuring. EKG nonischemic and trop neg, doubt ACS. Anemia is at baseline. Renal function normal. BNP normal.   Following nebs, pt feels much better and is ambulatory in  ED w/o difficulty. Will tx with steroids, doxy, outpt follow-up.   Of note, while awaiting D/C, COVID returned positive. Interestingly, his CT is unremarkable and he is overall very well appearing, as mentioned, without hypoxia. Given his comorbidities, will rx paxlovid but suspect he can continue to be managed as outpt at this time. Pt updated and  in agreement.  ____________________________________________  FINAL CLINICAL IMPRESSION(S) / ED DIAGNOSES  Final diagnoses:  Shortness of breath  Atypical chest pain     MEDICATIONS GIVEN DURING THIS VISIT:  Medications  ipratropium-albuterol (DUONEB) 0.5-2.5 (3) MG/3ML nebulizer solution 3 mL (3 mLs Nebulization Given 10/20/20 1518)  ipratropium-albuterol (DUONEB) 0.5-2.5 (3) MG/3ML nebulizer solution 3 mL (3 mLs Nebulization Given 10/20/20 1518)  ipratropium-albuterol (DUONEB) 0.5-2.5 (3) MG/3ML nebulizer solution 3 mL (3 mLs Nebulization Given 10/20/20 1519)  oxyCODONE-acetaminophen (PERCOCET/ROXICET) 5-325 MG per tablet 2 tablet (2 tablets Oral Given 10/20/20 1519)  iohexol (OMNIPAQUE) 350 MG/ML injection 75 mL (75 mLs Intravenous Contrast Given 10/20/20 1539)     ED Discharge Orders          Ordered    predniSONE (DELTASONE) 20 MG tablet  Daily        10/20/20 1806    albuterol (VENTOLIN HFA) 108 (90 Base) MCG/ACT inhaler  Every 4 hours PRN        10/20/20 1806    doxycycline (VIBRAMYCIN) 100 MG capsule  2 times daily        10/20/20 1806    nirmatrelvir/ritonavir EUA (PAXLOVID) 20 x 150 MG & 10 x 100MG  TABS  2 times daily        10/20/20 1839             Note:  This document was prepared using Dragon voice recognition software and may include unintentional dictation errors.   Duffy Bruce, MD 10/21/20 [phone]

## 2020-10-20 NOTE — Telephone Encounter (Signed)
Patient sister called stating that patient needs to be seen today for severe pain. When questioned about pain, he reports that he has had pain for the past month n his right chest which doe snot radiate into jaw or arm. He also reports shortness of breath. I then was told that he is currently at the health clinic being evaluated by the nurse and she said he needs to go to the ER so his sister is taking him to the ER

## 2020-10-22 ENCOUNTER — Telehealth: Payer: Self-pay | Admitting: Oncology

## 2020-10-22 NOTE — Telephone Encounter (Signed)
Spoke with patient's sister about the rescheduling of appointments due to patient's COVID test results on 9/6. She confirmed new day and time and also stated that patient would need transport moving forward as she has many other scheduling conflicts. Transport arranged and confirmed with [place].   Notified radiation and they have adjusted their appointments as well. Sending AVS today.

## 2020-10-23 ENCOUNTER — Inpatient Hospital Stay: Payer: Medicaid Other

## 2020-10-23 ENCOUNTER — Inpatient Hospital Stay: Payer: Medicaid Other | Admitting: Oncology

## 2020-10-26 ENCOUNTER — Ambulatory Visit: Payer: Medicaid Other | Admitting: Radiation Oncology

## 2020-10-30 ENCOUNTER — Emergency Department: Payer: Medicaid Other

## 2020-10-30 ENCOUNTER — Other Ambulatory Visit: Payer: Self-pay

## 2020-10-30 DIAGNOSIS — Z85118 Personal history of other malignant neoplasm of bronchus and lung: Secondary | ICD-10-CM | POA: Diagnosis not present

## 2020-10-30 DIAGNOSIS — I10 Essential (primary) hypertension: Secondary | ICD-10-CM | POA: Diagnosis not present

## 2020-10-30 DIAGNOSIS — K2100 Gastro-esophageal reflux disease with esophagitis, without bleeding: Secondary | ICD-10-CM | POA: Insufficient documentation

## 2020-10-30 DIAGNOSIS — Z87891 Personal history of nicotine dependence: Secondary | ICD-10-CM | POA: Insufficient documentation

## 2020-10-30 DIAGNOSIS — R079 Chest pain, unspecified: Secondary | ICD-10-CM | POA: Diagnosis present

## 2020-10-30 LAB — BASIC METABOLIC PANEL
Anion gap: 8 (ref 5–15)
BUN: 18 mg/dL (ref 8–23)
CO2: 23 mmol/L (ref 22–32)
Calcium: 8.6 mg/dL — ABNORMAL LOW (ref 8.9–10.3)
Chloride: 103 mmol/L (ref 98–111)
Creatinine, Ser: 0.9 mg/dL (ref 0.61–1.24)
GFR, Estimated: >60 mL/min (ref >60)
Glucose, Bld: 114 mg/dL — ABNORMAL HIGH (ref 70–99)
Potassium: 3.9 mmol/L (ref 3.5–5.1)
Sodium: 134 mmol/L — ABNORMAL LOW (ref 135–145)

## 2020-10-30 LAB — CBC
HCT: 36.6 % — ABNORMAL LOW (ref 39.0–52.0)
Hemoglobin: 12 g/dL — ABNORMAL LOW (ref 13.0–17.0)
MCH: 25.8 pg — ABNORMAL LOW (ref 26.0–34.0)
MCHC: 32.8 g/dL (ref 30.0–36.0)
MCV: 78.7 fL — ABNORMAL LOW (ref 80.0–100.0)
Platelets: 384 10*3/uL (ref 150–400)
RBC: 4.65 MIL/uL (ref 4.22–5.81)
RDW: 18.7 % — ABNORMAL HIGH (ref 11.5–15.5)
WBC: 10.7 10*3/uL — ABNORMAL HIGH (ref 4.0–10.5)
nRBC: 0 % (ref 0.0–0.2)

## 2020-10-30 LAB — TROPONIN I (HIGH SENSITIVITY)
Troponin I (High Sensitivity): 10 ng/L (ref <18)
Troponin I (High Sensitivity): 11 ng/L (ref <18)

## 2020-10-30 MED ORDER — ACETAMINOPHEN 325 MG PO TABS
650.0000 mg | ORAL_TABLET | Freq: Once | ORAL | Status: AC
  Administered 2020-10-31: 650 mg via ORAL
  Filled 2020-10-30: qty 2

## 2020-10-30 MED ORDER — LIDOCAINE VISCOUS HCL 2 % MT SOLN
15.0000 mL | Freq: Once | OROMUCOSAL | Status: AC
  Administered 2020-10-31: 15 mL via ORAL
  Filled 2020-10-30: qty 15

## 2020-10-30 MED ORDER — ALUM & MAG HYDROXIDE-SIMETH 200-200-20 MG/5ML PO SUSP
30.0000 mL | Freq: Once | ORAL | Status: AC
  Administered 2020-10-31: 30 mL via ORAL
  Filled 2020-10-30: qty 30

## 2020-10-30 NOTE — ED Triage Notes (Addendum)
Pt c/o chest/epigastric pain x 3 weeks.  Pain score 8/10.  Pt reports "every time I eat, it kills me."  Reports he was seen previously for same at "a clinic" and was prescribed medication.  Sts medication has not helped.   Hx of lung CA.   Pt was diagnosed w/ COVID x 10 days ago.

## 2020-10-30 NOTE — ED Notes (Signed)
Pt to desk stating chest pain returned.  MD notified and EKG order placed VORB.

## 2020-10-31 ENCOUNTER — Emergency Department
Admission: EM | Admit: 2020-10-31 | Discharge: 2020-10-31 | Disposition: A | Discharge status: Home / Self Care | Payer: Medicaid Other | Attending: Emergency Medicine | Admitting: Emergency Medicine

## 2020-10-31 DIAGNOSIS — R079 Chest pain, unspecified: Secondary | ICD-10-CM

## 2020-10-31 DIAGNOSIS — K2100 Gastro-esophageal reflux disease with esophagitis, without bleeding: Secondary | ICD-10-CM

## 2020-10-31 MED ORDER — FAMOTIDINE 20 MG PO TABS: 20.0000 mg | ORAL_TABLET | Freq: Every day | ORAL | 1 refills | Status: DC

## 2020-10-31 MED ORDER — LIDOCAINE VISCOUS HCL 2 % MT SOLN
15.0000 mL | Freq: Once | OROMUCOSAL | Status: AC
  Administered 2020-10-31: 15 mL via OROMUCOSAL
  Filled 2020-10-31: qty 15

## 2020-10-31 MED ORDER — FAMOTIDINE 20 MG PO TABS
20.0000 mg | ORAL_TABLET | Freq: Once | ORAL | Status: AC
  Administered 2020-10-31: 20 mg via ORAL
  Filled 2020-10-31: qty 1

## 2020-10-31 MED ORDER — SUCRALFATE 1 G PO TABS: 1.0000 g | ORAL_TABLET | Freq: Four times a day (QID) | ORAL | 0 refills | Status: DC

## 2020-10-31 MED ORDER — LIDOCAINE VISCOUS HCL 2 % MT SOLN: 15.0000 mL | Freq: Four times a day (QID) | OROMUCOSAL | 0 refills | Status: DC | PRN

## 2020-10-31 NOTE — Discharge Instructions (Addendum)
Please seek medical attention for any high fevers, chest pain, shortness of breath, change in behavior, persistent vomiting, bloody stool or any other new or concerning symptoms.  

## 2020-10-31 NOTE — ED Provider Notes (Signed)
[facility] Emergency Department Provider Note  ____________________________________________   I have reviewed the triage vital signs and the nursing notes.   HISTORY  Chief Complaint Chest Pain   History limited by: Not Limited   HPI Mitchell Frye is a 70 y.o. male who presents to the emergency department today because of concern for chest pain. He states it has been present for month. Located in the center chest. He describes it as burning. It is worse after he eats or when he lies flat. He says he has seen his doctor about this problem.     Records reviewed. Per medical record review patient has a history of lung cancer, diagnosis of covid.   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   B12 deficiency 11/04/2019   Goals of care, counseling/discussion 10/04/2019   Malignant neoplasm of lower lobe of left lung ([place]) 10/04/2019    Past Surgical History:  Procedure Laterality Date   PORTA CATH INSERTION N/A 10/22/2019   Procedure: PORTA CATH INSERTION;  Surgeon: Katha Cabal, MD;  Location: [place] CV LAB;  Service: Cardiovascular;  Laterality: N/A;   VIDEO BRONCHOSCOPY WITH ENDOBRONCHIAL ULTRASOUND N/A 09/25/2019   Procedure: VIDEO BRONCHOSCOPY WITH ENDOBRONCHIAL ULTRASOUND;  Surgeon: Tyler Pita, MD;  Location: [facility] ORS;  Service: Pulmonary;  Laterality: N/A;    Prior to Admission medications   Medication Sig Start Date End Date Taking? Authorizing Provider  acetaminophen (TYLENOL) 500 MG tablet Take 1,000 mg by mouth every 6 (six) hours as needed for mild pain, moderate pain or headache. Patient not taking: Reported on 09/25/2020    [provider]  albuterol (VENTOLIN HFA) 108 (90 Base) MCG/ACT inhaler Inhale 2 puffs into the  lungs every 4 (four) hours as needed for wheezing or shortness of breath. 10/20/20   Duffy Bruce, MD  ANORO ELLIPTA 62.5-25 MCG/INH AEPB INHALE 1 PUFF INTO THE LUNGS DAILY 09/30/20   Sindy Guadeloupe, MD  benzonatate (TESSALON PERLES) 100 MG capsule Take 1 capsule (100 mg total) by mouth every 6 (six) hours as needed for cough. 09/25/20 09/25/21  Sindy Guadeloupe, MD  HYDROcodone bit-homatropine (HYDROMET) 5-1.5 MG/5ML syrup Take 5 mLs by mouth every 6 (six) hours as needed for cough. 10/02/20   Sindy Guadeloupe, MD  hydrOXYzine (ATARAX/VISTARIL) 25 MG tablet Take 25 mg by mouth daily. 06/11/20   [provider]  lidocaine-prilocaine (EMLA) cream Apply 1 application topically once. 12/18/19   [provider]  oxyCODONE (OXY IR/ROXICODONE) 5 MG immediate release tablet Take 2 tablets (10 mg total) by mouth every 4 (four) hours as needed for severe pain. Patient not taking: No sig reported 12/02/19   Verlon Au, NP  potassium chloride SA (KLOR-CON) 20 MEQ tablet Take 1 tablet (20 mEq total) by mouth 2 (two) times daily. 09/25/20   Sindy Guadeloupe, MD  sucralfate (CARAFATE) 1 g tablet DISSOLVE 1 TABLET IN 3 TO 4 TABLESPOONS OF WARM WATER, THEN SWISH AND SWALLOW BY MOUTH THREE TIMES DAILY Patient not taking: No sig reported 02/26/20   Noreene Filbert, MD  traZODone (DESYREL) 50 MG tablet TAKE 1 TABLET(50 MG) BY MOUTH AT BEDTIME Patient not taking: No sig reported 04/23/20   Sindy Guadeloupe, MD  prochlorperazine (COMPAZINE)  10 MG tablet TAKE 1 TABLET(10 MG) BY MOUTH EVERY 6 HOURS AS NEEDED FOR NAUSEA OR VOMITING 10/22/19 11/25/19  Sindy Guadeloupe, MD    Allergies Taxol [paclitaxel]  Family History  Problem Relation Age of Onset   Cancer Brother     Social History Social History   Tobacco Use   Smoking status: Former    Packs/day: 1.00    Years: 20.00    Pack years: 20.00    Types: Cigarettes    Quit date: 1994    Years since quitting: 28.7   Smokeless tobacco: Never  Vaping Use    Vaping Use: Never used  Substance Use Topics   Alcohol use: Not Currently    Comment: not drank any beer in 2 onths   Drug use: Never    Review of Systems Constitutional: No fever/chills Eyes: No visual changes. ENT: No sore throat. Cardiovascular: Positive for chest pain. Respiratory: Denies shortness of breath. Gastrointestinal: No abdominal pain.  No nausea, no vomiting.  No diarrhea.   Genitourinary: Negative for dysuria. Musculoskeletal: Negative for back pain. Skin: Negative for rash. Neurological: Negative for headaches, focal weakness or numbness.  ____________________________________________   PHYSICAL EXAM:  VITAL SIGNS: ED Triage Vitals  Enc Vitals Group     BP 10/30/20 1724 129/87     Pulse Rate 10/30/20 1724 (!) 101     Resp 10/30/20 1724 (!) 28     Temp 10/30/20 1724 99 F (37.2 C)     Temp Source 10/30/20 1724 Oral     SpO2 10/30/20 1724 96 %     Weight 10/30/20 1726 185 lb (83.9 kg)     Height 10/30/20 1726 5\' 10"  (1.778 m)     Head Circumference --      Peak Flow --      Pain Score 10/30/20 1725 8   Constitutional: Alert and oriented.  Eyes: Conjunctivae are normal.  ENT      Head: Normocephalic and atraumatic.      Nose: No congestion/rhinnorhea.      Mouth/Throat: Mucous membranes are moist.      Neck: No stridor. Hematological/Lymphatic/Immunilogical: No cervical lymphadenopathy. Cardiovascular: Normal rate, regular rhythm.  No murmurs, rubs, or gallops.  Respiratory: Normal respiratory effort without tachypnea nor retractions. Breath sounds are clear and equal bilaterally. No wheezes/rales/rhonchi. Gastrointestinal: Soft and non tender. No rebound. No guarding.  Genitourinary: Deferred Musculoskeletal: Normal range of motion in all extremities. No lower extremity edema. Neurologic:  Normal speech and language. No gross focal neurologic deficits are appreciated.  Skin:  Skin is warm, dry and intact. No rash noted. Psychiatric: Mood and affect  are normal. Speech and behavior are normal. Patient exhibits appropriate insight and judgment.  ____________________________________________    LABS (pertinent positives/negatives)  BMP na 134, k 3.9, glu 114, cr 0.90 CBC wbc 10.7, hgb 12.0, plt 384 Trop hs 10 to 11  ____________________________________________   EKG  I, Nance Pear, attending physician, personally viewed and interpreted this EKG  EKG Time: 2022 Rate: 106 Rhythm: sinus tachycardia Axis: normal Intervals: qtc 427 QRS: narrow ST changes: no st elevation Impression: abnormal ekg  ____________________________________________    RADIOLOGY  CXR No acute changes from recent CXR ____________________________________________   PROCEDURES  Procedures  ____________________________________________   INITIAL IMPRESSION / ASSESSMENT AND PLAN / ED COURSE  Pertinent labs & imaging results that were available during my care of the patient were reviewed by me and considered in my medical decision making (see chart for details).  Patient presented for chest pain that has been present for roughly a month. Troponin was negative. EKG without concerning findings. CXR negative. Patient was tachycardic when he first arrived however did feel improvement after GI cocktail and heart rate improved. At this time I do have low suspicion for PE given length of symptoms, lack of hypoxia, improvement with GI cocktail. Discussed gerd with patient. Will plan on discharging with medication to help with symptoms.   ____________________________________________   FINAL CLINICAL IMPRESSION(S) / ED DIAGNOSES  Final diagnoses:  Nonspecific chest pain  Gastroesophageal reflux disease with esophagitis without hemorrhage     Note: This dictation was prepared with Dragon dictation. Any transcriptional errors that result from this process are unintentional     Nance Pear, MD 10/31/20 [phone]

## 2020-11-01 ENCOUNTER — Other Ambulatory Visit: Payer: Self-pay | Admitting: Oncology

## 2020-11-04 ENCOUNTER — Encounter: Payer: Self-pay | Admitting: Emergency Medicine

## 2020-11-04 ENCOUNTER — Emergency Department
Admission: EM | Admit: 2020-11-04 | Discharge: 2020-11-04 | Disposition: A | Discharge status: Home / Self Care | Payer: Medicaid Other | Attending: Emergency Medicine | Admitting: Emergency Medicine

## 2020-11-04 ENCOUNTER — Other Ambulatory Visit: Payer: Self-pay

## 2020-11-04 ENCOUNTER — Emergency Department: Payer: Medicaid Other

## 2020-11-04 DIAGNOSIS — R059 Cough, unspecified: Secondary | ICD-10-CM | POA: Diagnosis not present

## 2020-11-04 DIAGNOSIS — R0602 Shortness of breath: Secondary | ICD-10-CM | POA: Insufficient documentation

## 2020-11-04 DIAGNOSIS — Z85118 Personal history of other malignant neoplasm of bronchus and lung: Secondary | ICD-10-CM | POA: Insufficient documentation

## 2020-11-04 DIAGNOSIS — Z87891 Personal history of nicotine dependence: Secondary | ICD-10-CM | POA: Insufficient documentation

## 2020-11-04 DIAGNOSIS — R079 Chest pain, unspecified: Secondary | ICD-10-CM

## 2020-11-04 DIAGNOSIS — I10 Essential (primary) hypertension: Secondary | ICD-10-CM | POA: Diagnosis not present

## 2020-11-04 LAB — CBC
HCT: 35.1 % — ABNORMAL LOW (ref 39.0–52.0)
Hemoglobin: 11.7 g/dL — ABNORMAL LOW (ref 13.0–17.0)
MCH: 26.4 pg (ref 26.0–34.0)
MCHC: 33.3 g/dL (ref 30.0–36.0)
MCV: 79.1 fL — ABNORMAL LOW (ref 80.0–100.0)
Platelets: 334 10*3/uL (ref 150–400)
RBC: 4.44 MIL/uL (ref 4.22–5.81)
RDW: 19.1 % — ABNORMAL HIGH (ref 11.5–15.5)
WBC: 9.4 10*3/uL (ref 4.0–10.5)
nRBC: 0 % (ref 0.0–0.2)

## 2020-11-04 LAB — BASIC METABOLIC PANEL
Anion gap: 8 (ref 5–15)
BUN: 16 mg/dL (ref 8–23)
CO2: 22 mmol/L (ref 22–32)
Calcium: 9 mg/dL (ref 8.9–10.3)
Chloride: 106 mmol/L (ref 98–111)
Creatinine, Ser: 0.8 mg/dL (ref 0.61–1.24)
GFR, Estimated: >60 mL/min (ref >60)
Glucose, Bld: 114 mg/dL — ABNORMAL HIGH (ref 70–99)
Potassium: 4.1 mmol/L (ref 3.5–5.1)
Sodium: 136 mmol/L (ref 135–145)

## 2020-11-04 LAB — BRAIN NATRIURETIC PEPTIDE: B Natriuretic Peptide: 30.8 pg/mL (ref 0.0–100.0)

## 2020-11-04 LAB — TROPONIN I (HIGH SENSITIVITY)
Troponin I (High Sensitivity): 8 ng/L (ref <18)
Troponin I (High Sensitivity): 7 ng/L (ref <18)

## 2020-11-04 MED ORDER — ACETAMINOPHEN 500 MG PO TABS
1000.0000 mg | ORAL_TABLET | Freq: Once | ORAL | Status: AC
  Administered 2020-11-04: 1000 mg via ORAL
  Filled 2020-11-04: qty 2

## 2020-11-04 MED ORDER — LIDOCAINE VISCOUS HCL 2 % MT SOLN
15.0000 mL | Freq: Once | OROMUCOSAL | Status: AC
  Administered 2020-11-04: 15 mL via OROMUCOSAL
  Filled 2020-11-04: qty 15

## 2020-11-04 MED ORDER — SUCRALFATE 1 G PO TABS
1.0000 g | ORAL_TABLET | Freq: Once | ORAL | Status: AC
  Administered 2020-11-04: 1 g via ORAL
  Filled 2020-11-04: qty 1

## 2020-11-04 MED ORDER — IOHEXOL 350 MG/ML SOLN
75.0000 mL | Freq: Once | INTRAVENOUS | Status: AC | PRN
  Administered 2020-11-04: 75 mL via INTRAVENOUS

## 2020-11-04 MED ORDER — OXYCODONE HCL 5 MG PO TABS
10.0000 mg | ORAL_TABLET | ORAL | Status: DC | PRN
Start: 2020-11-04 — End: 2020-11-04
  Administered 2020-11-04: 10 mg via ORAL
  Filled 2020-11-04: qty 2

## 2020-11-04 MED ORDER — ALBUTEROL SULFATE (2.5 MG/3ML) 0.083% IN NEBU: 2.5000 mg | INHALATION_SOLUTION | RESPIRATORY_TRACT | Status: DC | PRN

## 2020-11-04 MED ORDER — MORPHINE SULFATE (PF) 4 MG/ML IV SOLN
6.0000 mg | Freq: Once | INTRAVENOUS | Status: AC
  Administered 2020-11-04: 6 mg via INTRAVENOUS
  Filled 2020-11-04: qty 2

## 2020-11-04 MED ORDER — ALUM & MAG HYDROXIDE-SIMETH 200-200-20 MG/5ML PO SUSP
30.0000 mL | Freq: Once | ORAL | Status: AC
  Administered 2020-11-04: 30 mL via ORAL
  Filled 2020-11-04: qty 30

## 2020-11-04 MED ORDER — PANTOPRAZOLE SODIUM 40 MG IV SOLR
40.0000 mg | Freq: Once | INTRAVENOUS | Status: AC
  Administered 2020-11-04: 40 mg via INTRAVENOUS
  Filled 2020-11-04: qty 40

## 2020-11-04 NOTE — ED Provider Notes (Signed)
Emergency Medicine Provider Triage Evaluation Note  Mitchell Frye , a 70 y.o. male  was evaluated in triage.  Pt complains of shortness of breath with increase in reflux. No relief with home medications  Review of Systems  Positive: Chest pain, dyspnea Negative: Fever, nausea, vomiting  Physical Exam  Ht 5\' 10"  (1.778 m)   Wt 83.9 kg   BMI 26.54 kg/m  Gen:   Awake, no distress   Resp:  Normal effort  MSK:   Moves extremities without difficulty  Other:    Medical Decision Making  Medically screening exam initiated at 9:11 AM.  Appropriate orders placed.  Mitchell Frye was informed that the remainder of the evaluation will be completed by another provider, this initial triage assessment does not replace that evaluation, and the importance of remaining in the ED until their evaluation is complete.   Victorino Dike, FNP 11/04/20 1431    [name], MD 11/04/20 1459

## 2020-11-04 NOTE — ED Provider Notes (Signed)
[facility] Emergency Department Provider Note  ____________________________________________   Event Date/Time   First MD Initiated Contact with Patient 11/04/20 [phone]     (approximate)  I have reviewed the triage vital signs and the nursing notes.   HISTORY  Chief Complaint Shortness of Breath   HPI Mitchell Frye is a 70 y.o. male with a past medical history of HTN, HDL and lung cancer s/p radiation complicated by some radiation esophagitis and currently receiving outpatient chemo who presents for assessment of some chest pain as well as shortness of breath.  Patient states he thinks his chest pain is related to reflux he is having.  States he felt fine when bed last night but felt it fairly severe this morning.  States this is very similar to reflux pain he has had the past going on and off for several weeks.  States feels a little more short of breath than usual but thinks it may be from his chest pain.  He states he has a chronic cough has not changed from baseline.  There is no new headache, earache, sore throat, fevers, abdominal pain, nausea, vomiting, diarrhea, burning with urination, rash or other acute sick symptoms at this time.  Denies any recent EtOH use or NSAID use.         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 CV LAB;  Service: Cardiovascular;  Laterality: N/A;   VIDEO BRONCHOSCOPY WITH ENDOBRONCHIAL ULTRASOUND N/A  09/25/2019   Procedure: VIDEO BRONCHOSCOPY WITH ENDOBRONCHIAL ULTRASOUND;  Surgeon: Tyler Pita, MD;  Location: [facility] ORS;  Service: Pulmonary;  Laterality: N/A;    Prior to Admission medications   Medication Sig Start Date End Date Taking? Authorizing Provider  acetaminophen (TYLENOL) 500 MG tablet Take 1,000 mg by mouth every 6 (six) hours as needed for mild pain, moderate pain or headache. Patient not taking: Reported on 09/25/2020    [provider]  albuterol (VENTOLIN HFA) 108 (90 Base) MCG/ACT inhaler Inhale 2 puffs into the lungs every 4 (four) hours as needed for wheezing or shortness of breath. 10/20/20   Duffy Bruce, MD  ANORO ELLIPTA 62.5-25 MCG/INH AEPB INHALE 1 PUFF INTO THE LUNGS DAILY 09/30/20   Sindy Guadeloupe, MD  benzonatate (TESSALON PERLES) 100 MG capsule Take 1 capsule (100 mg total) by mouth every 6 (six) hours as needed for cough. 09/25/20 09/25/21  Sindy Guadeloupe, MD  famotidine (PEPCID) 20 MG tablet Take 1 tablet (20 mg total) by mouth daily. 10/31/20 10/31/21  Nance Pear, MD  HYDROcodone bit-homatropine (HYDROMET) 5-1.5 MG/5ML syrup Take 5 mLs by mouth every 6 (six) hours as needed for cough. 10/02/20   Sindy Guadeloupe, MD  hydrOXYzine (ATARAX/VISTARIL) 25 MG tablet Take 25 mg by mouth daily. 06/11/20   [provider]  lidocaine (XYLOCAINE) 2 % solution Use as directed 15 mLs in the mouth or throat every 6 (six) hours as needed (abdominal  pain). 10/31/20   Nance Pear, MD  lidocaine-prilocaine (EMLA) cream Apply 1 application topically once. 12/18/19   [provider]  oxyCODONE (OXY IR/ROXICODONE) 5 MG immediate release tablet Take 2 tablets (10 mg total) by mouth every 4 (four) hours as needed for severe pain. Patient not taking: No sig reported 12/02/19   Verlon Au, NP  potassium chloride SA (KLOR-CON) 20 MEQ tablet TAKE 1 TABLET(20 MEQ) BY MOUTH TWICE DAILY 11/01/20   Sindy Guadeloupe, MD  sucralfate (CARAFATE) 1 g tablet DISSOLVE  1 TABLET IN 3 TO 4 TABLESPOONS OF WARM WATER, THEN SWISH AND SWALLOW BY MOUTH THREE TIMES DAILY Patient not taking: No sig reported 02/26/20   Noreene Filbert, MD  sucralfate (CARAFATE) 1 g tablet Take 1 tablet (1 g total) by mouth 4 (four) times daily. 10/31/20   Nance Pear, MD  traZODone (DESYREL) 50 MG tablet TAKE 1 TABLET(50 MG) BY MOUTH AT BEDTIME Patient not taking: No sig reported 04/23/20   Sindy Guadeloupe, MD  prochlorperazine (COMPAZINE) 10 MG tablet TAKE 1 TABLET(10 MG) BY MOUTH EVERY 6 HOURS AS NEEDED FOR NAUSEA OR VOMITING 10/22/19 11/25/19  Sindy Guadeloupe, MD    Allergies Taxol [paclitaxel]  Family History  Problem Relation Age of Onset   Cancer Brother     Social History Social History   Tobacco Use   Smoking status: Former    Packs/day: 1.00    Years: 20.00    Pack years: 20.00    Types: Cigarettes    Quit date: 1994    Years since quitting: 28.7   Smokeless tobacco: Never  Vaping Use   Vaping Use: Never used  Substance Use Topics   Alcohol use: Not Currently    Comment: not drank any beer in 2 onths   Drug use: Never    Review of Systems  Review of Systems  Constitutional:  Negative for chills and fever.  HENT:  Negative for sore throat.   Eyes:  Negative for pain.  Respiratory:  Positive for cough (chronic) and shortness of breath. Negative for stridor.   Cardiovascular:  Positive for chest pain.  Gastrointestinal:  Negative for vomiting.  Genitourinary:  Negative for dysuria.  Musculoskeletal:  Negative for myalgias.  Skin:  Negative for rash.  Neurological:  Negative for seizures, loss of consciousness and headaches.  Psychiatric/Behavioral:  Negative for suicidal ideas.   All other systems reviewed and are negative.    ____________________________________________   PHYSICAL EXAM:  VITAL SIGNS: ED Triage Vitals  Enc Vitals Group     BP 11/04/20 0912 (!) 139/101     Pulse Rate 11/04/20 0912 99     Resp 11/04/20 0912 (!) 24     Temp  11/04/20 0912 97.8 F (36.6 C)     Temp Source 11/04/20 0912 Oral     SpO2 11/04/20 0912 96 %     Weight 11/04/20 0910 184 lb 15.5 oz (83.9 kg)     Height 11/04/20 0910 5\' 10"  (1.778 m)     Head Circumference --      Peak Flow --      Pain Score 11/04/20 0910 0     Pain Loc --      Pain Edu? --      Excl. in [place]? --    Vitals:   11/04/20 1117 11/04/20 1143  BP: 137/75 132/66  Pulse: 77 78  Resp: 20 20  Temp:    SpO2: 97% 96%   Physical Exam Vitals  and nursing note reviewed.  Constitutional:      Appearance: He is well-developed.  HENT:     Head: Normocephalic and atraumatic.     Right Ear: External ear normal.     Left Ear: External ear normal.     Nose: Nose normal.     Mouth/Throat:     Mouth: Mucous membranes are moist.  Eyes:     Conjunctiva/sclera: Conjunctivae normal.  Cardiovascular:     Rate and Rhythm: Normal rate and regular rhythm.     Pulses: Normal pulses.     Heart sounds: No murmur heard. Pulmonary:     Effort: Pulmonary effort is normal. Tachypnea present. No respiratory distress.     Breath sounds: Normal breath sounds.  Abdominal:     Palpations: Abdomen is soft.     Tenderness: There is no abdominal tenderness.  Musculoskeletal:     Cervical back: Neck supple.     Right lower leg: No edema.     Left lower leg: No edema.  Skin:    General: Skin is warm and dry.     Capillary Refill: Capillary refill takes less than 2 seconds.  Neurological:     Mental Status: He is alert and oriented to person, place, and time.  Psychiatric:        Mood and Affect: Mood normal.     ____________________________________________   LABS (all labs ordered are listed, but only abnormal results are displayed)  Labs Reviewed  BASIC METABOLIC PANEL - Abnormal; Notable for the following components:      Result Value   Glucose, Bld 114 (*)    All other components within normal limits  CBC - Abnormal; Notable for the following components:   Hemoglobin 11.7 (*)     HCT 35.1 (*)    MCV 79.1 (*)    RDW 19.1 (*)    All other components within normal limits  BRAIN NATRIURETIC PEPTIDE  TROPONIN I (HIGH SENSITIVITY)  TROPONIN I (HIGH SENSITIVITY)   ____________________________________________  EKG  Sinus rhythm with ventricular rate of 95, left axis deviation, unremarkable intervals without clear evidence of acute ischemia or significant arrhythmia. ____________________________________________  RADIOLOGY  ED MD interpretation: CTA chest shows no evidence of PE or large dissection.  There is some stable left lung perihilar consolidation bandlike areas compatible with recent posttreatment changes.  There is no new evidence of new pneumonia, effusion or acute heart failure.  No other acute process.  Official radiology report(s): CT Angio Chest PE W and/or Wo Contrast  Result Date: 11/04/2020 CLINICAL DATA:  Acute shortness of breath, dyspnea, recently COVID positive 10/20/2020 EXAM: CT ANGIOGRAPHY CHEST WITH CONTRAST TECHNIQUE: Multidetector CT imaging of the chest was performed using the standard protocol during bolus administration of intravenous contrast. Multiplanar CT image reconstructions and MIPs were obtained to evaluate the vascular anatomy. CONTRAST:  31mL OMNIPAQUE IOHEXOL 350 MG/ML SOLN COMPARISON:  10/20/2020 FINDINGS: Cardiovascular: No significant central or proximal hilar pulmonary artery filling defect or acute pulmonary embolus. Limited assessment of the smaller peripheral segmental branches particularly in the lower lobes with respiratory motion artifact. No significant interval change compared to 10/20/2020. Minor aortic atherosclerosis. Negative for aneurysm or dissection. No mediastinal hemorrhage or hematoma. Patent 2 vessel arch anatomy. Central venous structures appear patent.  No veno-occlusive process. Right IJ power port catheter noted. Mild cardiac enlargement. No pericardial effusion. Mediastinum/Nodes: Trachea and central airways  are patent. Slight air distention of the esophagus, nonspecific. No hiatal hernia. No bulky adenopathy. Lungs/Pleura: Stable  left hilar/bronchovascular volume loss and consolidation with similar areas of peripheral left upper lobe, lingula and right lower lobe bronchovascular opacities. Findings are favored to be post treatment changes. No new acute airspace process, edema, or CHF. No pleural abnormality, effusion or pneumothorax. Upper Abdomen: No acute upper abdominal abnormality. Stable hypodense well-circumscribed pancreas cystic lesion in the pancreas body measuring 14 mm. Please refer to the prior abdominal CTs. No associated ductal dilatation or surrounding inflammatory process. Stable left upper pole hypodense renal cyst. Musculoskeletal: Degenerative changes noted of the spine. No chest wall soft tissue abnormality or asymmetry. Intact thoracic spine. No compression fracture. Sternum intact. Review of the MIP images confirms the above findings. IMPRESSION: No significant acute central or proximal hilar pulmonary embolus by CTA. Limited assessment of the peripheral segmental branches because contrast opacification and motion artifact. No significant interval change compared to 10/20/2020. Stable left lung perihilar consolidation and bandlike areas of scarring compatible with post treatment changes. No superimposed acute airspace process or new consolidation. Stable cardiomegaly without CHF Aortic Atherosclerosis (ICD10-I70.0). Electronically Signed   By: Jerilynn Mages.  Shick M.D.   On: 11/04/2020 11:01    ____________________________________________   PROCEDURES  Procedure(s) performed (including Critical Care):  Procedures   ____________________________________________   INITIAL IMPRESSION / ASSESSMENT AND PLAN / ED COURSE     Patient presents with above-stated history exam for assessment of some chest pain shortness of breath that he states started this morning for is a similar to bad reflux pain he  has had in the past.  On arrival he is tachypneic otherwise stable vital signs on room air.  He is some decreased air movement but otherwise no significant rhonchi or wheezing.  Differential includes PE, pneumonia, ACS, myocarditis, pericarditis and possible esophagitis reflux she has a history of radiation esophagitis.  He denies any recent EtOH or NSAID use.  ECG nonelevated troponin x2 are not suggestive of ACS or myocarditis.  Patient does not appear volume overloaded and BNP is double digits and overall I have low suspicion for acute heart failure.  CBC shows no leukocytosis or acute anemia.  BMP is no significant electrolyte or metabolic derangements.  CTA chest shows no evidence of PE or large dissection.  There is some stable left lung perihilar consolidation bandlike areas compatible with recent posttreatment changes.  There is no new evidence of new pneumonia, effusion or acute heart failure.  No other acute process.  After below noted GI cocktail and some morphine as well as a dose of his home p.o. analgesia patient's he was feeling better.  Suspect likely flare versus esophagitis versus reflux.  However given stable vitals otherwise reassuring exam work-up I think he is stable for discharge with close outpatient follow-up.  Discharged stable condition.  Strict return precautions advised and discussed.       ____________________________________________   FINAL CLINICAL IMPRESSION(S) / ED DIAGNOSES  Final diagnoses:  Chest pain, unspecified type    Medications  albuterol (PROVENTIL) (2.5 MG/3ML) 0.083% nebulizer solution 2.5 mg (has no administration in time range)  oxyCODONE (Oxy IR/ROXICODONE) immediate release tablet 10 mg (10 mg Oral Given 11/04/20 1152)  alum & mag hydroxide-simeth (MAALOX/MYLANTA) 200-200-20 MG/5ML suspension 30 mL (30 mLs Oral Given 11/04/20 1008)  sucralfate (CARAFATE) tablet 1 g (1 g Oral Given 11/04/20 1006)  pantoprazole (PROTONIX) injection 40 mg (40 mg  Intravenous Given 11/04/20 1006)  morphine 4 MG/ML injection 6 mg (6 mg Intravenous Given 11/04/20 1007)  lidocaine (XYLOCAINE) 2 % viscous mouth solution 15 mL (  15 mLs Mouth/Throat Given 11/04/20 1008)  iohexol (OMNIPAQUE) 350 MG/ML injection 75 mL (75 mLs Intravenous Contrast Given 11/04/20 1035)     ED Discharge Orders     None        Note:  This document was prepared using Dragon voice recognition software and may include unintentional dictation errors.    [name], MD 11/04/20 [phone]

## 2020-11-04 NOTE — ED Triage Notes (Signed)
C/O SOB and acid reflux on and off, worse today.  Patient is dyspneic at rest.

## 2020-11-05 ENCOUNTER — Telehealth: Payer: Self-pay | Admitting: *Deleted

## 2020-11-05 ENCOUNTER — Inpatient Hospital Stay: Payer: Medicaid Other

## 2020-11-05 ENCOUNTER — Other Ambulatory Visit: Payer: Self-pay | Admitting: *Deleted

## 2020-11-05 ENCOUNTER — Other Ambulatory Visit: Payer: Self-pay

## 2020-11-05 ENCOUNTER — Inpatient Hospital Stay: Payer: Medicaid Other | Attending: Oncology | Admitting: Hospice and Palliative Medicine

## 2020-11-05 VITALS — BP 114/89 | HR 104 | Temp 98.6°F | Resp 20

## 2020-11-05 DIAGNOSIS — R12 Heartburn: Secondary | ICD-10-CM

## 2020-11-05 DIAGNOSIS — C3490 Malignant neoplasm of unspecified part of unspecified bronchus or lung: Secondary | ICD-10-CM

## 2020-11-05 DIAGNOSIS — K219 Gastro-esophageal reflux disease without esophagitis: Secondary | ICD-10-CM | POA: Insufficient documentation

## 2020-11-05 DIAGNOSIS — Z9221 Personal history of antineoplastic chemotherapy: Secondary | ICD-10-CM | POA: Diagnosis not present

## 2020-11-05 DIAGNOSIS — Z5112 Encounter for antineoplastic immunotherapy: Secondary | ICD-10-CM | POA: Diagnosis present

## 2020-11-05 DIAGNOSIS — R0602 Shortness of breath: Secondary | ICD-10-CM | POA: Diagnosis not present

## 2020-11-05 DIAGNOSIS — Z85118 Personal history of other malignant neoplasm of bronchus and lung: Secondary | ICD-10-CM | POA: Diagnosis not present

## 2020-11-05 DIAGNOSIS — Z923 Personal history of irradiation: Secondary | ICD-10-CM | POA: Diagnosis not present

## 2020-11-05 DIAGNOSIS — E785 Hyperlipidemia, unspecified: Secondary | ICD-10-CM | POA: Diagnosis not present

## 2020-11-05 DIAGNOSIS — C3432 Malignant neoplasm of lower lobe, left bronchus or lung: Secondary | ICD-10-CM | POA: Insufficient documentation

## 2020-11-05 DIAGNOSIS — Z79899 Other long term (current) drug therapy: Secondary | ICD-10-CM | POA: Diagnosis not present

## 2020-11-05 DIAGNOSIS — I700 Atherosclerosis of aorta: Secondary | ICD-10-CM | POA: Insufficient documentation

## 2020-11-05 DIAGNOSIS — I10 Essential (primary) hypertension: Secondary | ICD-10-CM | POA: Insufficient documentation

## 2020-11-05 DIAGNOSIS — R0600 Dyspnea, unspecified: Secondary | ICD-10-CM

## 2020-11-05 MED ORDER — ALBUTEROL SULFATE HFA 108 (90 BASE) MCG/ACT IN AERS
2.0000 | INHALATION_SPRAY | RESPIRATORY_TRACT | 1 refills | Status: DC | PRN
Start: 2020-11-05 — End: 2021-02-12

## 2020-11-05 MED ORDER — OXYCODONE HCL 5 MG PO TABS: 10.0000 mg | ORAL_TABLET | ORAL | 0 refills | Status: DC | PRN

## 2020-11-05 MED ORDER — PANTOPRAZOLE SODIUM 40 MG PO TBEC: DELAYED_RELEASE_TABLET | ORAL | 3 refills | Status: DC

## 2020-11-05 MED ORDER — SUCRALFATE 1 G PO TABS: 1.0000 g | ORAL_TABLET | Freq: Four times a day (QID) | ORAL | 0 refills | Status: DC

## 2020-11-05 MED ORDER — ANORO ELLIPTA 62.5-25 MCG/INH IN AEPB: 1.0000 | INHALATION_SPRAY | Freq: Every day | RESPIRATORY_TRACT | 3 refills | Status: DC

## 2020-11-05 NOTE — Progress Notes (Signed)
Pt presents to [facility] for ongoing chest pain. He states that he went to the ED yesterday. His sister is accompanying him today. She is worried pt has an infection somewhere, and wants to know why he cannot be admitted. Pt stated that he has been taking Carafate and Omeprazole as ordered with no relief.

## 2020-11-05 NOTE — Telephone Encounter (Signed)
We cannot have him admitted. He can be seen in [facility] today or tomorrow. I will also reach out to dr Patsey Berthold in the meanwhile

## 2020-11-05 NOTE — Progress Notes (Signed)
Symptom Management [place]  [phone] [phone]  Patient Care Team: Center, [facility] as PCP - General Telford Nab, RN as Oncology Nurse Navigator   Name of the patient: Mitchell Frye  [id number]  11-Nov-1950   Date of visit: 11/05/20  Reason for Consult:  Mitchell Frye is a 70 y.o. 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  PET/CT showed hypermetabolic mass in the left lower lobe with mediastinal and right paratracheal adenopathy.  Pathology was consistent with adenocarcinoma.  Patient completed concurrent chemoradiation and is now on maintenance durvalumab.  Patient has been seen in the ER 9/6, 9/17, and 9/21 for burning chest pain with negative cardiac and pulmonary work-up.  He had a CTA yesterday which was negative for PE and acute pulmonary changes.  He had some stable perihilar scarring associated with posttreatment changes.  Patient improved with GI cocktail as symptoms were presumed secondary to his GERD.  Patient presents to [facility] today with complaint of recurrent burning pain in his esophagus that he says has been persistent over the past month.  He is on omeprazole 40 mg daily and Carafate 1 g 3 times daily.  He says that this combination initially helped but he no longer feels it is effective.  He denies taking any aspirin but was less certain about NSAIDs.  He thinks he might have taken Aleve.  He denies alcohol use.  Denies any neurologic complaints. Denies recent fevers or illnesses. Denies any easy bleeding or bruising. Reports good appetite and denies weight loss. Denies chest pain. Denies any nausea, vomiting, constipation, or diarrhea. Denies urinary complaints. Patient offers no further specific complaints today.    PAST MEDICAL HISTORY: Past Medical History:  Diagnosis Date    Dyspnea    Hyperlipidemia    Hypertension    Primary malignant neoplasm of left lower lobe of lung ([place])     PAST SURGICAL HISTORY:  Past Surgical History:  Procedure Laterality Date   PORTA CATH INSERTION N/A 10/22/2019   Procedure: PORTA CATH INSERTION;  Surgeon: Katha Cabal, MD;  Location: [place] [facility];  Service: Cardiovascular;  Laterality: N/A;   VIDEO BRONCHOSCOPY WITH ENDOBRONCHIAL ULTRASOUND N/A 09/25/2019   Procedure: VIDEO BRONCHOSCOPY WITH ENDOBRONCHIAL ULTRASOUND;  Surgeon: Tyler Pita, MD;  Location: [facility] ORS;  Service: Pulmonary;  Laterality: N/A;    HEMATOLOGY/ONCOLOGY HISTORY:  Oncology History  Malignant neoplasm of lower lobe of left lung ([place])  10/04/2019 Initial Diagnosis   Malignant neoplasm of lower lobe of left lung ([place])   10/04/2019 Cancer Staging   Staging form: Lung, AJCC 8th Edition - Clinical stage from 10/04/2019: Stage IIIB (cT2b, cN3, cM0) - Signed by Sindy Guadeloupe, MD on 10/04/2019   10/28/2019 - 11/18/2019 Chemotherapy   The patient had dexamethasone (DECADRON) 4 MG tablet, 8 mg, Oral, Daily, 1 of 1 cycle, Start date: 10/04/2019, End date: -- palonosetron (ALOXI) injection 0.25 mg, 0.25 mg, Intravenous,  Once, 4 of 4 cycles Administration: 0.25 mg (10/28/2019), 0.25 mg (11/04/2019), 0.25 mg (11/11/2019), 0.25 mg (11/18/2019) PACLitaxel-protein bound (ABRAXANE) chemo infusion 200 mg, 100 mg/m2 = 200 mg (100 % of original dose 100 mg/m2), Intravenous,  Once, 4 of 4 cycles Dose modification: 100 mg/m2 (original dose 100 mg/m2, Cycle 2), 80 mg/m2 (original dose 100 mg/m2, Cycle 3, Reason: Other (see comments), Comment: neutropenia), 100 mg/m2 (original dose 100 mg/m2, Cycle 1) Administration: 200  mg (11/04/2019), 175 mg (11/11/2019), 200 mg (10/29/2019), 175 mg (11/18/2019) CARBOplatin (PARAPLATIN) 210 mg in sodium chloride 0.9 % 250 mL chemo infusion, 210 mg (100 % of original dose 214.6 mg), Intravenous,  Once, 4 of 4 cycles Dose modification:    (original dose 214.6 mg, Cycle 1) Administration: 210 mg (10/28/2019), 210 mg (11/04/2019), 210 mg (11/11/2019), 210 mg (11/18/2019) PACLitaxel (TAXOL) 90 mg in sodium chloride 0.9 % 250 mL chemo infusion (</= 80mg /m2), 45 mg/m2 = 90 mg, Intravenous,  Once, 1 of 1 cycle Administration: 90 mg (10/28/2019)   for chemotherapy treatment.     11/25/2019 - 11/25/2019 Chemotherapy   The patient had palonosetron (ALOXI) injection 0.25 mg, 0.25 mg, Intravenous,  Once, 1 of 1 cycle Administration: 0.25 mg (11/25/2019) PEMEtrexed (ALIMTA) 1,000 mg in sodium chloride 0.9 % 100 mL chemo infusion, 500 mg/m2 = 1,000 mg, Intravenous,  Once, 1 of 1 cycle Administration: 1,000 mg (11/25/2019) CARBOplatin (PARAPLATIN) 550 mg in sodium chloride 0.9 % 250 mL chemo infusion, 550 mg (100 % of original dose 548 mg), Intravenous,  Once, 1 of 1 cycle Dose modification:   (original dose 548 mg, Cycle 1) Administration: 550 mg (11/25/2019) fosaprepitant (EMEND) 150 mg in sodium chloride 0.9 % 145 mL IVPB, 150 mg, Intravenous,  Once, 1 of 1 cycle Administration: 150 mg (11/25/2019)   for chemotherapy treatment.     01/14/2020 -  Chemotherapy    Patient is on Treatment Plan: LUNG DURVALUMAB Q14D         ALLERGIES:  is allergic to taxol [paclitaxel].  MEDICATIONS:  Current Outpatient Medications  Medication Sig Dispense Refill   acetaminophen (TYLENOL) 500 MG tablet Take 1,000 mg by mouth every 6 (six) hours as needed for mild pain, moderate pain or headache. (Patient not taking: Reported on 09/25/2020)     albuterol (VENTOLIN HFA) 108 (90 Base) MCG/ACT inhaler Inhale 2 puffs into the lungs every 4 (four) hours as needed for wheezing or shortness of breath. 8 g 1   ANORO ELLIPTA 62.5-25 MCG/INH AEPB INHALE 1 PUFF INTO THE LUNGS DAILY 60 each 3   benzonatate (TESSALON PERLES) 100 MG capsule Take 1 capsule (100 mg total) by mouth every 6 (six) hours as needed for cough. 90 capsule 2   famotidine (PEPCID) 20 MG  tablet Take 1 tablet (20 mg total) by mouth daily. 30 tablet 1   HYDROcodone bit-homatropine (HYDROMET) 5-1.5 MG/5ML syrup Take 5 mLs by mouth every 6 (six) hours as needed for cough. 120 mL 0   hydrOXYzine (ATARAX/VISTARIL) 25 MG tablet Take 25 mg by mouth daily.     lidocaine (XYLOCAINE) 2 % solution Use as directed 15 mLs in the mouth or throat every 6 (six) hours as needed (abdominal pain). 100 mL 0   lidocaine-prilocaine (EMLA) cream Apply 1 application topically once.     oxyCODONE (OXY IR/ROXICODONE) 5 MG immediate release tablet Take 2 tablets (10 mg total) by mouth every 4 (four) hours as needed for severe pain. (Patient not taking: No sig reported) 90 tablet 0   potassium chloride SA (KLOR-CON) 20 MEQ tablet TAKE 1 TABLET(20 MEQ) BY MOUTH TWICE DAILY 30 tablet 1   sucralfate (CARAFATE) 1 g tablet DISSOLVE 1 TABLET IN 3 TO 4 TABLESPOONS OF WARM WATER, THEN SWISH AND SWALLOW BY MOUTH THREE TIMES DAILY (Patient not taking: No sig reported) 90 tablet 1   sucralfate (CARAFATE) 1 g tablet Take 1 tablet (1 g total) by mouth 4 (four) times daily. 60 tablet 0  traZODone (DESYREL) 50 MG tablet TAKE 1 TABLET(50 MG) BY MOUTH AT BEDTIME (Patient not taking: No sig reported) 30 tablet 1   No current facility-administered medications for this visit.    VITAL SIGNS: BP 114/89   Pulse (!) 104   Temp 98.6 F (37 C) (Oral)   Resp 20   SpO2 95%  There were no vitals filed for this visit.  Estimated body mass index is 26.54 kg/m as calculated from the following:   Height as of 11/04/20: 5\' 10"  (1.778 m).   Weight as of 11/04/20: 184 lb 15.5 oz (83.9 kg).  LABS: CBC:    Component Value Date/Time   WBC 9.4 11/04/2020 0913   HGB 11.7 (L) 11/04/2020 0913   HCT 35.1 (L) 11/04/2020 0913   PLT 334 11/04/2020 0913   MCV 79.1 (L) 11/04/2020 0913   NEUTROABS 3.8 09/25/2020 0842   LYMPHSABS 1.1 09/25/2020 0842   MONOABS 0.6 09/25/2020 0842   EOSABS 0.1 09/25/2020 0842   BASOSABS 0.0 09/25/2020 0842    Comprehensive Metabolic Panel:    Component Value Date/Time   NA 136 11/04/2020 0913   K 4.1 11/04/2020 0913   CL 106 11/04/2020 0913   CO2 22 11/04/2020 0913   BUN 16 11/04/2020 0913   CREATININE 0.80 11/04/2020 0913   GLUCOSE 114 (H) 11/04/2020 0913   CALCIUM 9.0 11/04/2020 0913   AST 15 09/25/2020 0842   ALT 9 09/25/2020 0842   ALKPHOS 58 09/25/2020 0842   BILITOT 0.8 09/25/2020 0842   PROT 6.9 09/25/2020 0842   ALBUMIN 3.2 (L) 09/25/2020 0842    RADIOGRAPHIC STUDIES: DG Chest 2 View  Result Date: 10/30/2020 CLINICAL DATA:  Chest pain EXAM: CHEST - 2 VIEW COMPARISON:  10/20/2020, CT 10/20/2020, 09/02/2019 FINDINGS: Right-sided central venous port tip over the cavoatrial region. Similar left perihilar architectural distortion and density. No new airspace disease, pleural effusion, or pneumothorax. IMPRESSION: Left hilar distortion and density, corresponding to CT demonstrated post treatment changes and mass. This does not appear significantly changed. No acute interval changes as compared with 10/20/2020. Electronically Signed   By: Donavan Foil M.D.   On: 10/30/2020 18:21   DG Chest 2 View  Result Date: 10/20/2020 CLINICAL DATA:  Shortness of breath, history of lung cancer EXAM: CHEST - 2 VIEW COMPARISON:  09/02/2019 chest radiograph, 07/17/2020 CT chest. FINDINGS: Right chest port with catheter tip overlying the superior cavoatrial junction. The heart size and mediastinal contours are within normal limits. Opacity in the infrahilar left lung, in the location of the previously noted left lung mass, with increased surrounding linear opacities compared to the prior radiograph, which likely correlate with posttreatment fibrotic changes on the prior CT. No additional pulmonary opacity. No pleural effusion or pneumothorax. The visualized skeletal structures are unremarkable. IMPRESSION: 1. Left lung infrahilar opacity, which correlates with the previously noted left lung mass mass and  associated post treatment changes. This could be better evaluated with CT if clinically indicated. 2. No acute cardiopulmonary process. Electronically Signed   By: Merilyn Baba M.D.   On: 10/20/2020 14:46   CT Angio Chest PE W and/or Wo Contrast  Result Date: 11/04/2020 CLINICAL DATA:  Acute shortness of breath, dyspnea, recently COVID positive 10/20/2020 EXAM: CT ANGIOGRAPHY CHEST WITH CONTRAST TECHNIQUE: Multidetector CT imaging of the chest was performed using the standard protocol during bolus administration of intravenous contrast. Multiplanar CT image reconstructions and MIPs were obtained to evaluate the vascular anatomy. CONTRAST:  4mL OMNIPAQUE IOHEXOL 350 MG/ML SOLN  COMPARISON:  10/20/2020 FINDINGS: Cardiovascular: No significant central or proximal hilar pulmonary artery filling defect or acute pulmonary embolus. Limited assessment of the smaller peripheral segmental branches particularly in the lower lobes with respiratory motion artifact. No significant interval change compared to 10/20/2020. Minor aortic atherosclerosis. Negative for aneurysm or dissection. No mediastinal hemorrhage or hematoma. Patent 2 vessel arch anatomy. Central venous structures appear patent.  No veno-occlusive process. Right IJ power port catheter noted. Mild cardiac enlargement. No pericardial effusion. Mediastinum/Nodes: Trachea and central airways are patent. Slight air distention of the esophagus, nonspecific. No hiatal hernia. No bulky adenopathy. Lungs/Pleura: Stable left hilar/bronchovascular volume loss and consolidation with similar areas of peripheral left upper lobe, lingula and right lower lobe bronchovascular opacities. Findings are favored to be post treatment changes. No new acute airspace process, edema, or CHF. No pleural abnormality, effusion or pneumothorax. Upper Abdomen: No acute upper abdominal abnormality. Stable hypodense well-circumscribed pancreas cystic lesion in the pancreas body measuring 14  mm. Please refer to the prior abdominal CTs. No associated ductal dilatation or surrounding inflammatory process. Stable left upper pole hypodense renal cyst. Musculoskeletal: Degenerative changes noted of the spine. No chest wall soft tissue abnormality or asymmetry. Intact thoracic spine. No compression fracture. Sternum intact. Review of the MIP images confirms the above findings. IMPRESSION: No significant acute central or proximal hilar pulmonary embolus by CTA. Limited assessment of the peripheral segmental branches because contrast opacification and motion artifact. No significant interval change compared to 10/20/2020. Stable left lung perihilar consolidation and bandlike areas of scarring compatible with post treatment changes. No superimposed acute airspace process or new consolidation. Stable cardiomegaly without CHF Aortic Atherosclerosis (ICD10-I70.0). Electronically Signed   By: Jerilynn Mages.  Shick M.D.   On: 11/04/2020 11:01   CT Angio Chest PE W and/or Wo Contrast  Result Date: 10/20/2020 CLINICAL DATA:  70 year old male with shortness of breath, concern for pulmonary embolism. History of left lower lobe lung mass. EXAM: CT ANGIOGRAPHY CHEST WITH CONTRAST TECHNIQUE: Multidetector CT imaging of the chest was performed using the standard protocol during bolus administration of intravenous contrast. Multiplanar CT image reconstructions and MIPs were obtained to evaluate the vascular anatomy. CONTRAST:  Seventy-five mL Omnipaque 350, intravenous COMPARISON:  07/17/2020 FINDINGS: Cardiovascular: Satisfactory opacification of the pulmonary arteries to the segmental level. No evidence of pulmonary embolism. Mild global cardiomegaly. Scattered atherosclerotic calcifications of the thoracic aorta. No pericardial effusion. Right chest wall, IJ approach Port-A-Cath in place with the catheter tip near the cavoatrial junction. Mediastinum/Nodes: No enlarged mediastinal, hilar, or axillary lymph nodes. Thyroid gland,  trachea, and esophagus demonstrate no significant findings. Lungs/Pleura: Interval decreased peribronchovascular consolidative prominence about the superior segment of the left lower lobe, now difficult to measure. There persistent bandlike opacities throughout the medial aspect of the lingula and left upper lobe as well as the left lower lobe. Mild bibasilar subsegmental atelectasis. No new focal consolidations. No pleural effusion or pneumothorax. Upper Abdomen: Mildly decreased attenuation of the visualized hepatic parenchyma. No acute abnormality. Musculoskeletal: No chest wall abnormality. No acute or significant osseous findings. Review of the MIP images confirms the above findings. IMPRESSION: Vascular: 1. No evidence of pulmonary embolism. 2. Mild global cardiomegaly. 3.  Aortic Atherosclerosis (ICD10-I70.0). Non-Vascular: 1. No acute intrathoracic abnormality. 2. 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 Ruthann Cancer, MD [facility] Electronically Signed   By: Ruthann Cancer M.D.   On:  10/20/2020 16:08    PERFORMANCE STATUS (ECOG) : 1 - Symptomatic but completely ambulatory  Review of Systems Unless otherwise noted, a complete review of systems is negative.  Physical Exam General: NAD Cardiovascular: regular rate and rhythm Pulmonary: clear ant fields Abdomen: soft, nontender, + bowel sounds GU: no suprapubic tenderness Extremities: no edema, no joint deformities Skin: no rashes Neurological: Weakness but otherwise nonfocal  Assessment and Plan- Patient is a 70 y.o. male stage IIIb adenocarcinoma of the lung status post concurrent chemo/radiation now on maintenance immunotherapy   GERD -I saw the patient for similar complaint about a year ago and symptoms seemed to improve on Protonix.  Patient is now on  omeprazole.  Will rotate back to Protonix BID x 2 weeks. Will increase Carafate to 4 times daily. Will also refer to GI.  Consider H. pylori testing.  Patient advised to avoid NSAIDs.  He is not the most reliable historian regarding his medications and I spent time today reviewing his medications in detail. Will refill his oxycodone and inhalers per his request.    Patient expressed understanding and was in agreement with this plan. He also understands that He can call clinic at any time with any questions, concerns, or complaints.   Thank you for allowing me to participate in the care of this very pleasant patient.   Time Total: 20 minutes  Visit consisted of counseling and education dealing with the complex and emotionally intense issues of symptom management and palliative care in the setting of serious and potentially life-threatening illness.Greater than 50%  of this time was spent counseling and coordinating care related to the above assessment and plan.  Signed by: Altha Harm, PhD, NP-C

## 2020-11-05 NOTE — Telephone Encounter (Signed)
Patient sister called reporting that patient has been to ER again last evening due to shortness of breath and chest pain. She states that she would like to have him admitted without having to go to the ER if possible. She states that he was told that he may have an infection in his port causing his pain and that he may also have a lung infection. (Per ER note, patient felt he was having bad reflux) I explained to her that we can not just admit him without seeing him, she said she could bring him in, but it would take her 3 hours to do so because she lives in [place]. Please advise

## 2020-11-06 ENCOUNTER — Ambulatory Visit (INDEPENDENT_AMBULATORY_CARE_PROVIDER_SITE_OTHER): Payer: Medicaid Other | Admitting: Primary Care

## 2020-11-06 ENCOUNTER — Other Ambulatory Visit: Payer: Self-pay

## 2020-11-06 ENCOUNTER — Encounter: Payer: Self-pay | Admitting: *Deleted

## 2020-11-06 ENCOUNTER — Encounter: Payer: Self-pay | Admitting: Primary Care

## 2020-11-06 VITALS — BP 130/70 | HR 104 | Temp 98.3°F | Ht 70.0 in | Wt 174.0 lb

## 2020-11-06 DIAGNOSIS — R0609 Other forms of dyspnea: Secondary | ICD-10-CM

## 2020-11-06 DIAGNOSIS — R0602 Shortness of breath: Secondary | ICD-10-CM

## 2020-11-06 DIAGNOSIS — C3432 Malignant neoplasm of lower lobe, left bronchus or lung: Secondary | ICD-10-CM | POA: Diagnosis not present

## 2020-11-06 DIAGNOSIS — T66XXXA Radiation sickness, unspecified, initial encounter: Secondary | ICD-10-CM

## 2020-11-06 DIAGNOSIS — K208 Other esophagitis without bleeding: Secondary | ICD-10-CM | POA: Diagnosis not present

## 2020-11-06 MED ORDER — BREZTRI AEROSPHERE 160-9-4.8 MCG/ACT IN AERO: 2.0000 | INHALATION_SPRAY | Freq: Two times a day (BID) | RESPIRATORY_TRACT | 5 refills | Status: DC

## 2020-11-06 MED ORDER — BREZTRI AEROSPHERE 160-9-4.8 MCG/ACT IN AERO: 2.0000 | INHALATION_SPRAY | Freq: Two times a day (BID) | RESPIRATORY_TRACT | 0 refills | Status: DC

## 2020-11-06 NOTE — Patient Instructions (Addendum)
Recommendations: - Stop Anoro - Start [employer]- take two puffs morning and evening (rinse mouth after use) - Call [facility] [phone] to make new patient appointment   Orders: -Pulmonary function testing   Follow-up: -3 months with Dr. Patsey Berthold and PFTs

## 2020-11-06 NOTE — Progress Notes (Signed)
@Patient  ID: Mitchell Frye, male    DOB: Mar 08, 1950, 70 y.o.   MRN: [id number]  Chief Complaint  Patient presents with   Follow-up    Pt states SOB, chest pain, cough with phlegm     Referring provider: [facility]  HPI: 70 year old male, former smoker quit 1994 (20-pack-year history).  Past medical history significant for malignant neoplasm left lung, radiation esophagitis.  Patient of Dr. Patsey Berthold last seen in office on 09/18/2019.  Previous [facility] pulmonary encounter: 09/18/19- Dr. Patsey Berthold, consult  70 year old former smoker, who presented to [facility] emergency room on 02 September 2019 with a complaint of a 87-month cough worsened over the last few weeks and shortness of breath.  He is kindly referred by Dr. Randa Evens who saw the patient after his ED visit.  The patient underwent CT angio chest on 02 September 2019 which did not show PE.  However there was a soft tissue mass measuring 2.9 x 4 cm in the left hilum and an additional ipsilateral hilar and subcarinal adenopathy.  There was another masslike opacity contiguous to the more central perihilar mass measuring 3.6 x 3.1 cm.  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  We are asked to evaluate the patient for diagnostic procedure.  Patient received Augmentin at discharge.  He has completed the course.  Continues to have issues with cough productive of copious thick sputum.  Sputum is white to clear.  He does not have any hemoptysis.  He does not endorse any weight loss or anorexia.  He has had worsening gastroesophageal reflux symptoms and heartburn.  He had been given a trial of antireflux medication that he cannot name and stated that this was not helpful.  He has had orthopnea and states that sleeping on several pillows does alleviate some of his symptoms.  Oddly he also states that bike riding helps his symptoms.  He does awaken sometimes during the night feeling short of breath.  He has had no lower extremity edema or calf  tenderness.  He voices no other complaint.  PPD was negative, per patient, on 08/29/2019.  Troponins were negative on 02 September 2019 cute changes on EKG.   11/06/2020- Interim hx  Patient presents today for overdue follow-up, last seen 1 year ago. 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 He completed course of chemotherapy and radiation and is currently on maintenance immunotherapy with Durvalumab. He has had multiple ER visits this morning for chest pain. He has had a negative cardiac and pulmonary work up. CTA 11/04/20 was negative for PE or acute pulmonary changes. He has some stable scaring and posttreatment changes. Symptoms did improve with GI cocktail. He saw oncology yesterday. Reports recurrent burning pain in his esophagus. He is on omeprazole 40mg  daily and Carafate 1gram TID. Plan is for patient to start Protonix BID x 2 weeks and increased carafate to 4 times daily. He was referred to GI.   He is maintained on Anoro Ellipta and Ventolin hfa. His sister feels his breathing is not good. He has symptoms intermittently during the day but worse at night. He has an occasional cough and wheezing. He continues to have esophageal pain, awaiting appointment with gastroenterology.    Allergies  Allergen Reactions   Taxol [Paclitaxel] Other (See Comments)    Chest pain, hip pain, coughing , wheezing    Immunization History  Administered Date(s) Administered   Moderna Sars-Covid-2 Vaccination 09/06/2019   PPD Test 08/26/2019    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 (HCC)     Tobacco History: Social History   Tobacco Use  Smoking Status Former   Packs/day: 1.00   Years: 20.00   Pack years: 20.00   Types: Cigarettes   Quit date: 20   Years since quitting: 28.7  Smokeless Tobacco Never   Counseling given: Not Answered   Outpatient Medications Prior to  Visit  Medication Sig Dispense Refill   albuterol (VENTOLIN HFA) 108 (90 Base) MCG/ACT inhaler Inhale 2 puffs into the lungs every 4 (four) hours as needed for wheezing or shortness of breath. 8 g 1   famotidine (PEPCID) 20 MG tablet Take 1 tablet (20 mg total) by mouth daily. 30 tablet 1   lidocaine (XYLOCAINE) 2 % solution Use as directed 15 mLs in the mouth or throat every 6 (six) hours as needed (abdominal pain). 100 mL 0   lidocaine-prilocaine (EMLA) cream Apply 1 application topically once.     oxyCODONE (OXY IR/ROXICODONE) 5 MG immediate release tablet Take 2 tablets (10 mg total) by mouth every 4 (four) hours as needed for severe pain. 90 tablet 0   pantoprazole (PROTONIX) 40 MG tablet Take 1 tablet (40mg ) twice daily for two weeks and then take 1 tablet daily thereafter 60 tablet 3   potassium chloride SA (KLOR-CON) 20 MEQ tablet TAKE 1 TABLET(20 MEQ) BY MOUTH TWICE DAILY 30 tablet 1   sucralfate (CARAFATE) 1 g tablet Take 1 tablet (1 g total) by mouth 4 (four) times daily. 60 tablet 0   ANORO ELLIPTA 62.5-25 MCG/INH AEPB Inhale 1 puff into the lungs daily. 60 each 3   acetaminophen (TYLENOL) 500 MG tablet Take 1,000 mg by mouth every 6 (six) hours as needed for mild pain, moderate pain or headache. (Patient not taking: No sig reported)     benzonatate (TESSALON PERLES) 100 MG capsule Take 1 capsule (100 mg total) by mouth every 6 (six) hours as needed for cough. (Patient not taking: Reported on 11/06/2020) 90 capsule 2   HYDROcodone bit-homatropine (HYDROMET) 5-1.5 MG/5ML syrup Take 5 mLs by mouth every 6 (six) hours as needed for cough. 120 mL 0   hydrOXYzine (ATARAX/VISTARIL) 25 MG tablet Take 25 mg by mouth daily.     traZODone (DESYREL) 50 MG tablet TAKE 1 TABLET(50 MG) BY MOUTH AT BEDTIME (Patient not taking: No sig reported) 30 tablet 1   No facility-administered medications prior to visit.   Review of Systems  Review of Systems  Respiratory:  Positive for cough, shortness of  breath and wheezing.   Cardiovascular: Negative.  Negative for chest pain.  Gastrointestinal:        Epigastric pain    Physical Exam  BP 130/70 (BP Location: Left Arm, Patient Position: Sitting, Cuff Size: Normal)   Pulse (!) 104   Temp 98.3 F (36.8 C) (Oral)   Ht 5\' 10"  (1.778 m)   Wt 174 lb (78.9 kg)   SpO2 95%   BMI 24.97 kg/m  Physical Exam Constitutional:      Appearance: Normal appearance.  HENT:     Head: Normocephalic and atraumatic.  Cardiovascular:     Rate and Rhythm: Normal rate and regular rhythm.  Pulmonary:     Effort: Pulmonary effort is normal.     Breath sounds: Normal breath sounds. No wheezing or rales.     Comments: Poor effort  Skin:  General: Skin is warm and dry.  Neurological:     General: No focal deficit present.     Mental Status: He is alert and oriented to person, place, and time. Mental status is at baseline.  Psychiatric:        Mood and Affect: Mood normal.        Behavior: Behavior normal.        Thought Content: Thought content normal.        Judgment: Judgment normal.     Lab Results:  CBC    Component Value Date/Time   WBC 9.4 11/04/2020 0913   RBC 4.44 11/04/2020 0913   HGB 11.7 (L) 11/04/2020 0913   HCT 35.1 (L) 11/04/2020 0913   PLT 334 11/04/2020 0913   MCV 79.1 (L) 11/04/2020 0913   MCH 26.4 11/04/2020 0913   MCHC 33.3 11/04/2020 0913   RDW 19.1 (H) 11/04/2020 0913   LYMPHSABS 1.1 09/25/2020 0842   MONOABS 0.6 09/25/2020 0842   EOSABS 0.1 09/25/2020 0842   BASOSABS 0.0 09/25/2020 0842    BMET    Component Value Date/Time   NA 136 11/04/2020 0913   K 4.1 11/04/2020 0913   CL 106 11/04/2020 0913   CO2 22 11/04/2020 0913   GLUCOSE 114 (H) 11/04/2020 0913   BUN 16 11/04/2020 0913   CREATININE 0.80 11/04/2020 0913   CALCIUM 9.0 11/04/2020 0913   GFRNONAA >60 11/04/2020 0913   GFRAA >60 11/18/2019 0923    BNP    Component Value Date/Time   BNP 30.8 11/04/2020 0955    ProBNP No results found for:  PROBNP  Imaging: DG Chest 2 View  Result Date: 10/30/2020 CLINICAL DATA:  Chest pain EXAM: CHEST - 2 VIEW COMPARISON:  10/20/2020, CT 10/20/2020, 09/02/2019 FINDINGS: Right-sided central venous port tip over the cavoatrial region. Similar left perihilar architectural distortion and density. No new airspace disease, pleural effusion, or pneumothorax. IMPRESSION: Left hilar distortion and density, corresponding to CT demonstrated post treatment changes and mass. This does not appear significantly changed. No acute interval changes as compared with 10/20/2020. Electronically Signed   By: Donavan Foil M.D.   On: 10/30/2020 18:21   DG Chest 2 View  Result Date: 10/20/2020 CLINICAL DATA:  Shortness of breath, history of lung cancer EXAM: CHEST - 2 VIEW COMPARISON:  09/02/2019 chest radiograph, 07/17/2020 CT chest. FINDINGS: Right chest port with catheter tip overlying the superior cavoatrial junction. The heart size and mediastinal contours are within normal limits. Opacity in the infrahilar left lung, in the location of the previously noted left lung mass, with increased surrounding linear opacities compared to the prior radiograph, which likely correlate with posttreatment fibrotic changes on the prior CT. No additional pulmonary opacity. No pleural effusion or pneumothorax. The visualized skeletal structures are unremarkable. IMPRESSION: 1. Left lung infrahilar opacity, which correlates with the previously noted left lung mass mass and associated post treatment changes. This could be better evaluated with CT if clinically indicated. 2. No acute cardiopulmonary process. Electronically Signed   By: Merilyn Baba M.D.   On: 10/20/2020 14:46   CT Angio Chest PE W and/or Wo Contrast  Result Date: 11/04/2020 CLINICAL DATA:  Acute shortness of breath, dyspnea, recently COVID positive 10/20/2020 EXAM: CT ANGIOGRAPHY CHEST WITH CONTRAST TECHNIQUE: Multidetector CT imaging of the chest was performed using the  standard protocol during bolus administration of intravenous contrast. Multiplanar CT image reconstructions and MIPs were obtained to evaluate the vascular anatomy. CONTRAST:  7mL OMNIPAQUE IOHEXOL 350 MG/ML  SOLN COMPARISON:  10/20/2020 FINDINGS: Cardiovascular: No significant central or proximal hilar pulmonary artery filling defect or acute pulmonary embolus. Limited assessment of the smaller peripheral segmental branches particularly in the lower lobes with respiratory motion artifact. No significant interval change compared to 10/20/2020. Minor aortic atherosclerosis. Negative for aneurysm or dissection. No mediastinal hemorrhage or hematoma. Patent 2 vessel arch anatomy. Central venous structures appear patent.  No veno-occlusive process. Right IJ power port catheter noted. Mild cardiac enlargement. No pericardial effusion. Mediastinum/Nodes: Trachea and central airways are patent. Slight air distention of the esophagus, nonspecific. No hiatal hernia. No bulky adenopathy. Lungs/Pleura: Stable left hilar/bronchovascular volume loss and consolidation with similar areas of peripheral left upper lobe, lingula and right lower lobe bronchovascular opacities. Findings are favored to be post treatment changes. No new acute airspace process, edema, or CHF. No pleural abnormality, effusion or pneumothorax. Upper Abdomen: No acute upper abdominal abnormality. Stable hypodense well-circumscribed pancreas cystic lesion in the pancreas body measuring 14 mm. Please refer to the prior abdominal CTs. No associated ductal dilatation or surrounding inflammatory process. Stable left upper pole hypodense renal cyst. Musculoskeletal: Degenerative changes noted of the spine. No chest wall soft tissue abnormality or asymmetry. Intact thoracic spine. No compression fracture. Sternum intact. Review of the MIP images confirms the above findings. IMPRESSION: No significant acute central or proximal hilar pulmonary embolus by CTA. Limited  assessment of the peripheral segmental branches because contrast opacification and motion artifact. No significant interval change compared to 10/20/2020. Stable left lung perihilar consolidation and bandlike areas of scarring compatible with post treatment changes. No superimposed acute airspace process or new consolidation. Stable cardiomegaly without CHF Aortic Atherosclerosis (ICD10-I70.0). Electronically Signed   By: Jerilynn Mages.  Shick M.D.   On: 11/04/2020 11:01   CT Angio Chest PE W and/or Wo Contrast  Result Date: 10/20/2020 CLINICAL DATA:  70 year old male with shortness of breath, concern for pulmonary embolism. History of left lower lobe lung mass. EXAM: CT ANGIOGRAPHY CHEST WITH CONTRAST TECHNIQUE: Multidetector CT imaging of the chest was performed using the standard protocol during bolus administration of intravenous contrast. Multiplanar CT image reconstructions and MIPs were obtained to evaluate the vascular anatomy. CONTRAST:  Seventy-five mL Omnipaque 350, intravenous COMPARISON:  07/17/2020 FINDINGS: Cardiovascular: Satisfactory opacification of the pulmonary arteries to the segmental level. No evidence of pulmonary embolism. Mild global cardiomegaly. Scattered atherosclerotic calcifications of the thoracic aorta. No pericardial effusion. Right chest wall, IJ approach Port-A-Cath in place with the catheter tip near the cavoatrial junction. Mediastinum/Nodes: No enlarged mediastinal, hilar, or axillary lymph nodes. Thyroid gland, trachea, and esophagus demonstrate no significant findings. Lungs/Pleura: Interval decreased peribronchovascular consolidative prominence about the superior segment of the left lower lobe, now difficult to measure. There persistent bandlike opacities throughout the medial aspect of the lingula and left upper lobe as well as the left lower lobe. Mild bibasilar subsegmental atelectasis. No new focal consolidations. No pleural effusion or pneumothorax. Upper Abdomen: Mildly  decreased attenuation of the visualized hepatic parenchyma. No acute abnormality. Musculoskeletal: No chest wall abnormality. No acute or significant osseous findings. Review of the MIP images confirms the above findings. IMPRESSION: Vascular: 1. No evidence of pulmonary embolism. 2. Mild global cardiomegaly. 3.  Aortic Atherosclerosis (ICD10-I70.0). Non-Vascular: 1. No acute intrathoracic abnormality. 2. 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 Ruthann Cancer, MD [facility] Electronically Signed   By: Glade Nurse.D.  On: 10/20/2020 16:08     Assessment & Plan:   Chronic dyspnea - Former smoker, hx lung cancer with suspected copd. No PFTs on file. Increased dyspnea symptoms with intermittent wheezing and cough. Recommend changing Anoro to [employer]. Follow up in 3 months with pulmonary function testing.   Radiation esophagitis - Protonix increased to twice daily x 2 weeks and carafate QID per oncology. He has been referred to GI, awaiting appointment      Martyn Ehrich, NP 11/09/2020

## 2020-11-09 ENCOUNTER — Encounter: Payer: Self-pay | Admitting: Primary Care

## 2020-11-09 DIAGNOSIS — R0609 Other forms of dyspnea: Secondary | ICD-10-CM | POA: Insufficient documentation

## 2020-11-09 NOTE — Assessment & Plan Note (Signed)
-   Former smoker, hx lung cancer with suspected copd. No PFTs on file. Increased dyspnea symptoms with intermittent wheezing and cough. Recommend changing Anoro to [employer]. Follow up in 3 months with pulmonary function testing.

## 2020-11-09 NOTE — Assessment & Plan Note (Signed)
-   Protonix increased to twice daily x 2 weeks and carafate QID per oncology. He has been referred to GI, awaiting appointment

## 2020-11-11 ENCOUNTER — Other Ambulatory Visit: Payer: Self-pay

## 2020-11-11 ENCOUNTER — Ambulatory Visit
Admission: RE | Admit: 2020-11-11 | Discharge: 2020-11-11 | Disposition: A | Discharge status: Home / Self Care | Payer: Medicaid Other | Source: Ambulatory Visit | Attending: Oncology | Admitting: Oncology

## 2020-11-11 DIAGNOSIS — R0600 Dyspnea, unspecified: Secondary | ICD-10-CM | POA: Diagnosis present

## 2020-11-11 DIAGNOSIS — I10 Essential (primary) hypertension: Secondary | ICD-10-CM | POA: Diagnosis not present

## 2020-11-11 DIAGNOSIS — I351 Nonrheumatic aortic (valve) insufficiency: Secondary | ICD-10-CM | POA: Diagnosis not present

## 2020-11-11 LAB — ECHOCARDIOGRAM COMPLETE
AR max vel: 2.76 cm2
AV Area VTI: 3.44 cm2
AV Area mean vel: 2.59 cm2
AV Mean grad: 3 mmHg
AV Peak grad: 5.1 mmHg
Ao pk vel: 1.13 m/s
Area-P 1/2: 3.91 cm2
S' Lateral: 2.4 cm

## 2020-11-11 NOTE — Progress Notes (Signed)
*  PRELIMINARY RESULTS* Echocardiogram 2D Echocardiogram has been performed.  Sherrie Sport 11/11/2020, 10:07 AM

## 2020-11-12 NOTE — Progress Notes (Signed)
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  He underwent CT angios chest which did not show any PE.  Soft tissue attenuation measuring 2.9 x 4 cm centered in the left hilum resulting in abrupt angulation and narrowing of the pulmonary arteries and the central left lower lobe airways.  Additional ipsilateral hilar and subcarinal adenopathy.  No contralateral adenopathy.  Consolidative masslike opacity 3.6 x 3.1 cm in size and contiguous with more central perihilar soft tissue attenuation.  Overall findings concerning for primary bronchogenic carcinoma.    Patient lives with his sister who is his main caregiver.  He does not drive but is independent of his ADLs.  Reports ongoing fatigue and occasional retrosternal chest pain.  He has been evaluated by GI in the past for reflux as well.  Appetie is fair and weight is stable.  Denies any significant shortness of breath at this time.  Patient is an ex-smoker and smoked for about 20 years but quit smoking back in 1994   PET CT scan showed hypermetabolic mass in the left lower lobe with mediastinal adenopathy and right paratracheal adenopathy.  No evidence of distant metastatic disease.  Pathology was consistent with non-small cell lung cancer favor adenocarcinoma.  Cells were positive for TTF-1 and negative for  p40.   Patient started concurrent carbotaxol radiation and then had allergic reaction to Taxol for which she was switched to Abraxane.  Given the short supply of Abraxane patient received carbo Alimta midway through his concurrent chemoradiation.  Scans post chemoradiation showed partial response and patient will be started on maintenance durvalumab on 01/14/2020.  Patient is currently receiving durvalumab every 4 weeks      Interval history-patient is 70 year old male who returns to clinic for labs, further evaluation, and consideration of cycle 16 of durvalumab.  He complains of acid reflux symptoms.  Has not tried taking anything for them.  Otherwise no specific complaints.  Chronic cough is stable. He is prescribed protonix but does not believe he's been taking this or takes it intermittently. Has not tried tums or others.   ECOG PS- 1 Pain scale- 0   Review of systems- Review of Systems  Constitutional:  Negative for chills, fever, malaise/fatigue and weight loss.  HENT:  Negative for congestion, ear discharge and nosebleeds.   Eyes:  Negative for blurred vision.  Respiratory:  Positive for cough. Negative for hemoptysis, sputum production, shortness of breath and wheezing.   Cardiovascular:  Negative for chest pain, palpitations, orthopnea and claudication.  Gastrointestinal:  Positive for heartburn and nausea. Negative for abdominal pain, blood in stool, constipation, diarrhea, melena and vomiting.  Genitourinary:  Negative for dysuria, flank pain, frequency, hematuria and urgency.  Musculoskeletal:  Negative for back pain, joint pain and myalgias.  Skin:  Negative for itching and rash.  Neurological:  Negative for dizziness, tingling, focal weakness, seizures, weakness and headaches.  Endo/Heme/Allergies:  Does not  bruise/bleed easily.  Psychiatric/Behavioral:  Negative for depression and suicidal ideas. The patient does not have insomnia.      Allergies  Allergen Reactions    Taxol [Paclitaxel] Other (See Comments)    Chest pain, hip pain, coughing , wheezing     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 [facility])      Past Surgical History:  Procedure Laterality Date   PORTA CATH INSERTION N/A 10/22/2019   Procedure: PORTA CATH INSERTION;  Surgeon: Katha Cabal, MD;  Location: [place] [facility];  Service: Cardiovascular;  Laterality: N/A;   VIDEO BRONCHOSCOPY WITH ENDOBRONCHIAL ULTRASOUND N/A 09/25/2019   Procedure: VIDEO BRONCHOSCOPY WITH ENDOBRONCHIAL ULTRASOUND;  Surgeon: Tyler Pita, MD;  Location: [facility];  Service: Pulmonary;  Laterality: N/A;    Social History   Socioeconomic History   Marital status: Single    Spouse name: Not on file   Number of children: Not on file   Years of education: Not on file   Highest education level: Not on file  Occupational History   Not on file  Tobacco Use   Smoking status: Former    Packs/day: 1.00    Years: 20.00    Pack years: 20.00    Types: Cigarettes    Quit date: 19    Years since quitting: 28.7   Smokeless tobacco: Never  Vaping Use   Vaping Use: Never used  Substance and Sexual Activity   Alcohol use: Not Currently    Comment: not drank any beer in 2 onths   Drug use: Never   Sexual activity: Not on file  Other Topics Concern   Not on file  Social History Narrative   Not on file   Social Determinants of Health   Financial Resource Strain: Not on file  Food Insecurity: Not on file  Transportation Needs: Not on file  Physical Activity: Not on file  Stress: Not on file  Social Connections: Not on file  Intimate Partner Violence: Not on file    Family History  Problem Relation Age of Onset   Cancer Brother      Current Outpatient Medications:    acetaminophen (TYLENOL) 500 MG tablet, Take 1,000 mg by mouth every 6 (six) hours as needed for mild pain, moderate pain  or headache. (Patient not taking: No sig reported), Disp: , Rfl:    albuterol (VENTOLIN HFA) 108 (90 Base) MCG/ACT inhaler, Inhale 2 puffs into the lungs every 4 (four) hours as needed for wheezing or shortness of breath., Disp: 8 g, Rfl: 1   benzonatate (TESSALON PERLES) 100 MG capsule, Take 1 capsule (100 mg total) by mouth every 6 (six) hours as needed for cough. (Patient not taking: Reported on 11/06/2020), Disp: 90 capsule, Rfl: 2   [START ON 11/13/2020] Budeson-Glycopyrrol-Formoterol (BREZTRI AEROSPHERE) 160-9-4.8 MCG/ACT AERO, Inhale 2 puffs into the lungs in the morning and at bedtime., Disp: 10.7 g, Rfl: 5   Budeson-Glycopyrrol-Formoterol (BREZTRI AEROSPHERE) 160-9-4.8 MCG/ACT AERO, Inhale 2 puffs into the lungs in the morning and at bedtime., Disp: 5.9 g, Rfl: 0   famotidine (PEPCID) 20 MG tablet, Take 1 tablet (20 mg total) by mouth daily., Disp: 30 tablet, Rfl: 1   HYDROcodone bit-homatropine (HYDROMET) 5-1.5 MG/5ML syrup, Take 5 mLs by mouth every 6 (six) hours as needed for cough., Disp: 120 mL, Rfl: 0   hydrOXYzine (ATARAX/VISTARIL) 25 MG  tablet, Take 25 mg by mouth daily., Disp: , Rfl:    lidocaine (XYLOCAINE) 2 % solution, Use as directed 15 mLs in the mouth or throat every 6 (six) hours as needed (abdominal pain)., Disp: 100 mL, Rfl: 0   lidocaine-prilocaine (EMLA) cream, Apply 1 application topically once., Disp: , Rfl:    oxyCODONE (OXY IR/ROXICODONE) 5 MG immediate release tablet, Take 2 tablets (10 mg total) by mouth every 4 (four) hours as needed for severe pain., Disp: 90 tablet, Rfl: 0   pantoprazole (PROTONIX) 40 MG tablet, Take 1 tablet (40mg ) twice daily for two weeks and then take 1 tablet daily thereafter, Disp: 60 tablet, Rfl: 3   potassium chloride SA (KLOR-CON) 20 MEQ tablet, TAKE 1 TABLET(20 MEQ) BY MOUTH TWICE DAILY, Disp: 30 tablet, Rfl: 1   sucralfate (CARAFATE) 1 g tablet, Take 1 tablet (1 g total) by mouth 4 (four) times daily., Disp: 60 tablet, Rfl: 0   traZODone  (DESYREL) 50 MG tablet, TAKE 1 TABLET(50 MG) BY MOUTH AT BEDTIME (Patient not taking: No sig reported), Disp: 30 tablet, Rfl: 1  Physical exam:  Vitals:   11/13/20 0852  BP: 122/88  Pulse: (!) 110  Resp: 18  Temp: 97.9 F (36.6 C)  TempSrc: Tympanic  Weight: 181 lb (82.1 kg)   Physical Exam Constitutional:      General: He is not in acute distress. HENT:     Mouth/Throat:     Mouth: Mucous membranes are moist.     Pharynx: Oropharynx is clear.  Cardiovascular:     Rate and Rhythm: Normal rate and regular rhythm.     Heart sounds: Normal heart sounds.  Pulmonary:     Effort: Pulmonary effort is normal.     Breath sounds: Normal breath sounds.  Abdominal:     General: Bowel sounds are normal.     Palpations: Abdomen is soft.  Musculoskeletal:     Cervical back: Normal range of motion.  Skin:    General: Skin is warm and dry.  Neurological:     Mental Status: He is alert and oriented to person, place, and time.     CMP Latest Ref Rng & Units 11/04/2020  Glucose 70 - 99 mg/dL 114(H)  BUN 8 - 23 mg/dL 16  Creatinine 0.61 - 1.24 mg/dL 0.80  Sodium 135 - 145 mmol/L 136  Potassium 3.5 - 5.1 mmol/L 4.1  Chloride 98 - 111 mmol/L 106  CO2 22 - 32 mmol/L 22  Calcium 8.9 - 10.3 mg/dL 9.0  Total Protein 6.5 - 8.1 g/dL -  Total Bilirubin 0.3 - 1.2 mg/dL -  Alkaline Phos 38 - 126 U/L -  AST 15 - 41 U/L -  ALT 0 - 44 U/L -   CBC Latest Ref Rng & Units 11/04/2020  WBC 4.0 - 10.5 K/uL 9.4  Hemoglobin 13.0 - 17.0 g/dL 11.7(L)  Hematocrit 39.0 - 52.0 % 35.1(L)  Platelets 150 - 400 K/uL 334      Assessment and plan- Patient is a 70 y.o. 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  He is here for on treatment assessment prior to cycle 16 of maintenance durvalumab  Labs reviewed and acceptable for continuation of treatment.  Proceed with cycle 16 of durvalumab today.  Plan was to continue through November 2022/1 year of adjuvant treatment.  He is tolerating  treatment well without significant side effects.  Acid reflux-encouraged compliance with daily PPI as he may be having rebound  acid reflux.  Can use Tums for breakthrough symptoms.  Will give Zofran for nausea.  He can also receive Pepcid IV.  Can consider referring to GI for following up with his primary care provider.  I do not suspect that this is related to [place] though there are some postmarketing reports of gastritis ([place] 2022).   Cancer related pain- followed by palliative care  4 weeks- labs, rao, durvalumab   Visit Diagnosis No diagnosis found.  Beckey Rutter, DNP, AGNP-C [place] at [facility] [phone] (clinic) 11/12/2020  Addendum: patient had worsening chest pain post infusion. May be chronic malignancy pain but given his other risk factors I'd like to have him evaluated by cardiology. If cleared, recommend continued pain control with palliative care and evaluation with GI, possible endoscopy.

## 2020-11-13 ENCOUNTER — Other Ambulatory Visit: Payer: Medicaid Other

## 2020-11-13 ENCOUNTER — Inpatient Hospital Stay: Payer: Medicaid Other

## 2020-11-13 ENCOUNTER — Emergency Department
Admission: EM | Admit: 2020-11-13 | Discharge: 2020-11-14 | Disposition: A | Discharge status: Home / Self Care | Payer: Medicaid Other | Attending: Emergency Medicine | Admitting: Emergency Medicine

## 2020-11-13 ENCOUNTER — Other Ambulatory Visit: Payer: Self-pay

## 2020-11-13 ENCOUNTER — Inpatient Hospital Stay (HOSPITAL_BASED_OUTPATIENT_CLINIC_OR_DEPARTMENT_OTHER): Payer: Medicaid Other | Admitting: Nurse Practitioner

## 2020-11-13 ENCOUNTER — Emergency Department: Payer: Medicaid Other

## 2020-11-13 ENCOUNTER — Other Ambulatory Visit: Payer: Self-pay | Admitting: Oncology

## 2020-11-13 ENCOUNTER — Encounter: Payer: Self-pay | Admitting: Emergency Medicine

## 2020-11-13 ENCOUNTER — Ambulatory Visit
Admission: RE | Admit: 2020-11-13 | Discharge: 2020-11-13 | Disposition: A | Discharge status: Home / Self Care | Payer: Medicare Other | Source: Ambulatory Visit | Attending: Radiation Oncology | Admitting: Radiation Oncology

## 2020-11-13 VITALS — BP 138/73 | HR 80 | Temp 97.3°F | Resp 18

## 2020-11-13 VITALS — BP 122/88 | HR 110 | Temp 97.9°F | Resp 18 | Wt 181.0 lb

## 2020-11-13 DIAGNOSIS — R12 Heartburn: Secondary | ICD-10-CM

## 2020-11-13 DIAGNOSIS — K209 Esophagitis, unspecified without bleeding: Secondary | ICD-10-CM | POA: Insufficient documentation

## 2020-11-13 DIAGNOSIS — R079 Chest pain, unspecified: Secondary | ICD-10-CM | POA: Diagnosis not present

## 2020-11-13 DIAGNOSIS — I10 Essential (primary) hypertension: Secondary | ICD-10-CM | POA: Insufficient documentation

## 2020-11-13 DIAGNOSIS — R1013 Epigastric pain: Secondary | ICD-10-CM | POA: Insufficient documentation

## 2020-11-13 DIAGNOSIS — Z923 Personal history of irradiation: Secondary | ICD-10-CM | POA: Insufficient documentation

## 2020-11-13 DIAGNOSIS — Z5112 Encounter for antineoplastic immunotherapy: Secondary | ICD-10-CM | POA: Diagnosis not present

## 2020-11-13 DIAGNOSIS — R072 Precordial pain: Secondary | ICD-10-CM | POA: Insufficient documentation

## 2020-11-13 DIAGNOSIS — C3490 Malignant neoplasm of unspecified part of unspecified bronchus or lung: Secondary | ICD-10-CM | POA: Diagnosis not present

## 2020-11-13 DIAGNOSIS — Z85118 Personal history of other malignant neoplasm of bronchus and lung: Secondary | ICD-10-CM | POA: Insufficient documentation

## 2020-11-13 DIAGNOSIS — Z87891 Personal history of nicotine dependence: Secondary | ICD-10-CM | POA: Insufficient documentation

## 2020-11-13 DIAGNOSIS — E876 Hypokalemia: Secondary | ICD-10-CM

## 2020-11-13 DIAGNOSIS — C3432 Malignant neoplasm of lower lobe, left bronchus or lung: Secondary | ICD-10-CM

## 2020-11-13 LAB — COMPREHENSIVE METABOLIC PANEL
ALT: 12 U/L (ref 0–44)
AST: 19 U/L (ref 15–41)
Albumin: 2.9 g/dL — ABNORMAL LOW (ref 3.5–5.0)
Alkaline Phosphatase: 48 U/L (ref 38–126)
Anion gap: 9 (ref 5–15)
BUN: 21 mg/dL (ref 8–23)
CO2: 22 mmol/L (ref 22–32)
Calcium: 8.4 mg/dL — ABNORMAL LOW (ref 8.9–10.3)
Chloride: 104 mmol/L (ref 98–111)
Creatinine, Ser: 0.85 mg/dL (ref 0.61–1.24)
GFR, Estimated: >60 mL/min (ref >60)
Glucose, Bld: 180 mg/dL — ABNORMAL HIGH (ref 70–99)
Potassium: 3.1 mmol/L — ABNORMAL LOW (ref 3.5–5.1)
Sodium: 135 mmol/L (ref 135–145)
Total Bilirubin: 0.6 mg/dL (ref 0.3–1.2)
Total Protein: 6.9 g/dL (ref 6.5–8.1)

## 2020-11-13 LAB — TROPONIN I (HIGH SENSITIVITY)
Troponin I (High Sensitivity): 8 ng/L (ref <18)
Troponin I (High Sensitivity): 8 ng/L (ref <18)

## 2020-11-13 LAB — HEPATIC FUNCTION PANEL
ALT: 12 U/L (ref 0–44)
AST: 15 U/L (ref 15–41)
Albumin: 2.9 g/dL — ABNORMAL LOW (ref 3.5–5.0)
Alkaline Phosphatase: 43 U/L (ref 38–126)
Bilirubin, Direct: 0.1 mg/dL (ref 0.0–0.2)
Indirect Bilirubin: 0.6 mg/dL (ref 0.3–0.9)
Total Bilirubin: 0.7 mg/dL (ref 0.3–1.2)
Total Protein: 6.3 g/dL — ABNORMAL LOW (ref 6.5–8.1)

## 2020-11-13 LAB — BASIC METABOLIC PANEL
Anion gap: 6 (ref 5–15)
BUN: 15 mg/dL (ref 8–23)
CO2: 24 mmol/L (ref 22–32)
Calcium: 8.6 mg/dL — ABNORMAL LOW (ref 8.9–10.3)
Chloride: 106 mmol/L (ref 98–111)
Creatinine, Ser: 0.72 mg/dL (ref 0.61–1.24)
GFR, Estimated: >60 mL/min (ref >60)
Glucose, Bld: 114 mg/dL — ABNORMAL HIGH (ref 70–99)
Potassium: 3.5 mmol/L (ref 3.5–5.1)
Sodium: 136 mmol/L (ref 135–145)

## 2020-11-13 LAB — CBC WITH DIFFERENTIAL/PLATELET
Abs Immature Granulocytes: 0.02 10*3/uL (ref 0.00–0.07)
Basophils Absolute: 0 10*3/uL (ref 0.0–0.1)
Basophils Relative: 0 %
Eosinophils Absolute: 0 10*3/uL (ref 0.0–0.5)
Eosinophils Relative: 1 %
HCT: 34 % — ABNORMAL LOW (ref 39.0–52.0)
Hemoglobin: 10.8 g/dL — ABNORMAL LOW (ref 13.0–17.0)
Immature Granulocytes: 0 %
Lymphocytes Relative: 18 %
Lymphs Abs: 1.1 10*3/uL (ref 0.7–4.0)
MCH: 25.4 pg — ABNORMAL LOW (ref 26.0–34.0)
MCHC: 31.8 g/dL (ref 30.0–36.0)
MCV: 80 fL (ref 80.0–100.0)
Monocytes Absolute: 0.6 10*3/uL (ref 0.1–1.0)
Monocytes Relative: 11 %
Neutro Abs: 4.1 10*3/uL (ref 1.7–7.7)
Neutrophils Relative %: 70 %
Platelets: 412 10*3/uL — ABNORMAL HIGH (ref 150–400)
RBC: 4.25 MIL/uL (ref 4.22–5.81)
RDW: 19.6 % — ABNORMAL HIGH (ref 11.5–15.5)
WBC: 5.8 10*3/uL (ref 4.0–10.5)
nRBC: 0 % (ref 0.0–0.2)

## 2020-11-13 LAB — CBC
HCT: 34 % — ABNORMAL LOW (ref 39.0–52.0)
Hemoglobin: 10.9 g/dL — ABNORMAL LOW (ref 13.0–17.0)
MCH: 25.4 pg — ABNORMAL LOW (ref 26.0–34.0)
MCHC: 32.1 g/dL (ref 30.0–36.0)
MCV: 79.3 fL — ABNORMAL LOW (ref 80.0–100.0)
Platelets: 429 10*3/uL — ABNORMAL HIGH (ref 150–400)
RBC: 4.29 MIL/uL (ref 4.22–5.81)
RDW: 19.6 % — ABNORMAL HIGH (ref 11.5–15.5)
WBC: 7.3 10*3/uL (ref 4.0–10.5)
nRBC: 0 % (ref 0.0–0.2)

## 2020-11-13 LAB — LIPASE, BLOOD: Lipase: 42 U/L (ref 11–51)

## 2020-11-13 MED ORDER — DICYCLOMINE HCL 10 MG/5ML PO SOLN
10.0000 mg | Freq: Once | ORAL | Status: DC
  Filled 2020-11-13 (×2): qty 5

## 2020-11-13 MED ORDER — ALUM & MAG HYDROXIDE-SIMETH 200-200-20 MG/5ML PO SUSP
30.0000 mL | Freq: Once | ORAL | Status: AC
  Administered 2020-11-13: 30 mL via ORAL
  Filled 2020-11-13: qty 30

## 2020-11-13 MED ORDER — HEPARIN SOD (PORK) LOCK FLUSH 100 UNIT/ML IV SOLN
500.0000 [IU] | Freq: Once | INTRAVENOUS | Status: DC | PRN
  Filled 2020-11-13: qty 5

## 2020-11-13 MED ORDER — OXYCODONE HCL 5 MG PO TABS
5.0000 mg | ORAL_TABLET | Freq: Once | ORAL | Status: AC
  Administered 2020-11-13: 5 mg via ORAL
  Filled 2020-11-13: qty 1

## 2020-11-13 MED ORDER — LIDOCAINE VISCOUS HCL 2 % MT SOLN
15.0000 mL | Freq: Once | OROMUCOSAL | Status: AC
  Administered 2020-11-13: 15 mL via ORAL
  Filled 2020-11-13: qty 15

## 2020-11-13 MED ORDER — ONDANSETRON HCL 4 MG/2ML IJ SOLN
4.0000 mg | Freq: Once | INTRAMUSCULAR | Status: AC
  Administered 2020-11-13: 4 mg via INTRAVENOUS
  Filled 2020-11-13: qty 2

## 2020-11-13 MED ORDER — ALUM & MAG HYDROXIDE-SIMETH 200-200-20 MG/5ML PO SUSP
30.0000 mL | Freq: Once | ORAL | Status: DC
  Filled 2020-11-13: qty 30

## 2020-11-13 MED ORDER — SODIUM CHLORIDE 0.9 % IV SOLN
1500.0000 mg | Freq: Once | INTRAVENOUS | Status: AC
  Administered 2020-11-13: 1500 mg via INTRAVENOUS
  Filled 2020-11-13: qty 30

## 2020-11-13 MED ORDER — IOHEXOL 350 MG/ML SOLN
75.0000 mL | Freq: Once | INTRAVENOUS | Status: AC | PRN
  Administered 2020-11-13: 75 mL via INTRAVENOUS

## 2020-11-13 MED ORDER — SODIUM CHLORIDE 0.9 % IV SOLN
Freq: Once | INTRAVENOUS | Status: AC
  Filled 2020-11-13: qty 250

## 2020-11-13 MED ORDER — POTASSIUM CHLORIDE 20 MEQ/100ML IV SOLN
20.0000 meq | Freq: Once | INTRAVENOUS | Status: AC
  Administered 2020-11-13: 20 meq via INTRAVENOUS

## 2020-11-13 MED ORDER — FAMOTIDINE 20 MG IN NS 100 ML IVPB
20.0000 mg | Freq: Once | INTRAVENOUS | Status: AC
  Administered 2020-11-13: 20 mg via INTRAVENOUS
  Filled 2020-11-13: qty 20

## 2020-11-13 MED ORDER — ACETAMINOPHEN 500 MG PO TABS
1000.0000 mg | ORAL_TABLET | Freq: Once | ORAL | Status: AC
  Administered 2020-11-13: 1000 mg via ORAL
  Filled 2020-11-13: qty 2

## 2020-11-13 MED ORDER — SODIUM CHLORIDE 0.9% FLUSH
10.0000 mL | INTRAVENOUS | Status: DC | PRN
  Administered 2020-11-13: 10 mL
  Filled 2020-11-13: qty 10

## 2020-11-13 MED ORDER — HYOSCYAMINE SULFATE 0.125 MG SL SUBL
0.2500 mg | SUBLINGUAL_TABLET | Freq: Once | SUBLINGUAL | Status: DC
  Filled 2020-11-13: qty 2

## 2020-11-13 MED ORDER — HEPARIN SOD (PORK) LOCK FLUSH 100 UNIT/ML IV SOLN
500.0000 [IU] | Freq: Once | INTRAVENOUS | Status: AC
  Administered 2020-11-13: 500 [IU] via INTRAVENOUS
  Filled 2020-11-13: qty 5

## 2020-11-13 MED ORDER — LIDOCAINE VISCOUS HCL 2 % MT SOLN
15.0000 mL | Freq: Once | OROMUCOSAL | Status: AC
  Administered 2020-11-13: 15 mL via OROMUCOSAL
  Filled 2020-11-13: qty 15

## 2020-11-13 MED ORDER — HYDROMORPHONE HCL 1 MG/ML IJ SOLN
1.0000 mg | Freq: Once | INTRAMUSCULAR | Status: AC
  Administered 2020-11-13: 1 mg via INTRAVENOUS
  Filled 2020-11-13: qty 1

## 2020-11-13 NOTE — ED Provider Notes (Signed)
[facility] Emergency Department Provider Note  ____________________________________________   Event Date/Time   First MD Initiated Contact with Patient 11/13/20 1342     (approximate)  I have reviewed the triage vital signs and the nursing notes.   HISTORY  Chief Complaint Chest Pain    HPI Mitchell Frye is a 70 y.o. male with history of hypertension, hyperlipidemia, history of radiation esophagitis, here with severe abdominal pain, reflux.  The patient states that he is here because he has had increasingly severe epigastric and chest pain.  He states that whenever he tries to eat, he gets severe epigastric discomfort and chest pain.  The pain is worse with attempted eating as well as movement.  He states that he has had some associated nausea.  He went to get a chemo treatment today and was notably uncomfortable due to this.  He was given antacids and a GI cocktail, without significant relief.  He remained very uncomfortable so was sent here for further evaluation.  He is in significant distress, somewhat limiting history.  He states most of his pain is epigastric and substernal.    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 [facility])     Patient Active Problem List   Diagnosis Date Noted   Chronic dyspnea 11/09/2020   Radiation esophagitis 12/02/2019   B12 deficiency 11/04/2019   Goals of care, counseling/discussion 10/04/2019   Malignant neoplasm of lower lobe of left lung ([place]) 10/04/2019    Past Surgical History:  Procedure Laterality Date   PORTA CATH INSERTION N/A 10/22/2019   Procedure: PORTA CATH INSERTION;  Surgeon: Katha Cabal, MD;  Location: [facility];  Service: Cardiovascular;  Laterality: N/A;   VIDEO BRONCHOSCOPY WITH ENDOBRONCHIAL ULTRASOUND N/A 09/25/2019   Procedure: VIDEO BRONCHOSCOPY WITH ENDOBRONCHIAL ULTRASOUND;   Surgeon: Tyler Pita, MD;  Location: [facility] ORS;  Service: Pulmonary;  Laterality: N/A;    Prior to Admission medications   Medication Sig Start Date End Date Taking? Authorizing Provider  acetaminophen (TYLENOL) 500 MG tablet Take 1,000 mg by mouth every 6 (six) hours as needed for mild pain, moderate pain or headache. Patient not taking: No sig reported    [provider]  albuterol (VENTOLIN HFA) 108 (90 Base) MCG/ACT inhaler Inhale 2 puffs into the lungs every 4 (four) hours as needed for wheezing or shortness of breath. 11/05/20   Borders, Kirt Boys, NP  benzonatate (TESSALON PERLES) 100 MG capsule Take 1 capsule (100 mg total) by mouth every 6 (six) hours as needed for cough. Patient not taking: No sig reported 09/25/20 09/25/21  Sindy Guadeloupe, MD  Budeson-Glycopyrrol-Formoterol (BREZTRI AEROSPHERE) 160-9-4.8 MCG/ACT AERO Inhale 2 puffs into the lungs in the morning and at bedtime. 11/13/20   Martyn Ehrich, NP  clobetasol cream (TEMOVATE) 0.05 % Apply topically. 10/28/20   [provider]  famotidine (PEPCID) 20 MG tablet Take 1 tablet (20 mg total) by mouth daily. 10/31/20 10/31/21  Nance Pear, MD  HYDROcodone bit-homatropine (HYDROMET) 5-1.5 MG/5ML syrup Take 5 mLs by mouth every 6 (six) hours as needed for cough. 10/02/20   Sindy Guadeloupe, MD  hydrOXYzine (ATARAX/VISTARIL) 25 MG tablet Take 25 mg by mouth daily. 06/11/20   [provider]  lidocaine (XYLOCAINE) 2 % solution Use as directed 15 mLs in the mouth or throat every 6 (six) hours as needed (  abdominal pain). 10/31/20   Nance Pear, MD  lidocaine-prilocaine (EMLA) cream Apply 1 application topically once. 12/18/19   [provider]  oxyCODONE (OXY IR/ROXICODONE) 5 MG immediate release tablet Take 2 tablets (10 mg total) by mouth every 4 (four) hours as needed for severe pain. 11/05/20   Borders, Kirt Boys, NP  pantoprazole (PROTONIX) 40 MG tablet Take 1 tablet (40mg ) twice daily for two  weeks and then take 1 tablet daily thereafter 11/05/20   Borders, Kirt Boys, NP  potassium chloride SA (KLOR-CON) 20 MEQ tablet TAKE 1 TABLET(20 MEQ) BY MOUTH TWICE DAILY 11/01/20   Sindy Guadeloupe, MD  sucralfate (CARAFATE) 1 g tablet Take 1 tablet (1 g total) by mouth 4 (four) times daily. 11/05/20   Borders, Kirt Boys, NP  traZODone (DESYREL) 50 MG tablet TAKE 1 TABLET(50 MG) BY MOUTH AT BEDTIME Patient not taking: No sig reported 04/23/20   Sindy Guadeloupe, MD  prochlorperazine (COMPAZINE) 10 MG tablet TAKE 1 TABLET(10 MG) BY MOUTH EVERY 6 HOURS AS NEEDED FOR NAUSEA OR VOMITING 10/22/19 11/25/19  Sindy Guadeloupe, MD    Allergies Taxol [paclitaxel]  Family History  Problem Relation Age of Onset   Cancer Brother     Social History Social History   Tobacco Use   Smoking status: Former    Packs/day: 1.00    Years: 20.00    Pack years: 20.00    Types: Cigarettes    Quit date: 1994    Years since quitting: 28.7   Smokeless tobacco: Never  Vaping Use   Vaping Use: Never used  Substance Use Topics   Alcohol use: Not Currently    Comment: not drank any beer in 2 onths   Drug use: Never    Review of Systems  Review of Systems  Constitutional:  Positive for fatigue. Negative for chills and fever.  HENT:  Negative for sore throat.   Respiratory:  Negative for shortness of breath.   Cardiovascular:  Negative for chest pain.  Gastrointestinal:  Positive for abdominal pain and nausea.  Genitourinary:  Negative for flank pain.  Musculoskeletal:  Negative for neck pain.  Skin:  Negative for rash and wound.  Allergic/Immunologic: Negative for immunocompromised state.  Neurological:  Negative for weakness and numbness.  Hematological:  Does not bruise/bleed easily.  All other systems reviewed and are negative.   ____________________________________________  PHYSICAL EXAM:      VITAL SIGNS: ED Triage Vitals  Enc Vitals Group     BP 11/13/20 1316 (!) 168/84     Pulse Rate 11/13/20 1316  92     Resp 11/13/20 1316 (!) 24     Temp 11/13/20 1320 98.8 F (37.1 C)     Temp Source 11/13/20 1316 Oral     SpO2 11/13/20 1316 97 %     Weight 11/13/20 1317 180 lb 12.4 oz (82 kg)     Height 11/13/20 1317 5\' 10"  (1.778 m)     Head Circumference --      Peak Flow --      Pain Score 11/13/20 1317 10     Pain Loc --      Pain Edu? --      Excl. in [place]? --      Physical Exam Vitals and nursing note reviewed.  Constitutional:      General: He is not in acute distress.    Appearance: He is well-developed.  HENT:     Head: Normocephalic and atraumatic.  Eyes:  Conjunctiva/sclera: Conjunctivae normal.  Cardiovascular:     Rate and Rhythm: Normal rate and regular rhythm.     Heart sounds: Normal heart sounds. No murmur heard.   No friction rub.  Pulmonary:     Effort: Pulmonary effort is normal. Tachypnea present. No respiratory distress.     Breath sounds: Normal breath sounds. No wheezing or rales.     Comments: Tachypneic, appears uncomfortable Abdominal:     General: There is no distension.     Palpations: Abdomen is soft.     Tenderness: There is abdominal tenderness in the epigastric area.  Musculoskeletal:     Cervical back: Neck supple.  Skin:    General: Skin is warm.     Capillary Refill: Capillary refill takes less than 2 seconds.  Neurological:     Mental Status: He is alert and oriented to person, place, and time.     Motor: No abnormal muscle tone.      ____________________________________________   LABS (all labs ordered are listed, but only abnormal results are displayed)  Labs Reviewed  BASIC METABOLIC PANEL - Abnormal; Notable for the following components:      Result Value   Glucose, Bld 114 (*)    Calcium 8.6 (*)    All other components within normal limits  CBC - Abnormal; Notable for the following components:   Hemoglobin 10.9 (*)    HCT 34.0 (*)    MCV 79.3 (*)    MCH 25.4 (*)    RDW 19.6 (*)    Platelets 429 (*)    All other  components within normal limits  HEPATIC FUNCTION PANEL - Abnormal; Notable for the following components:   Total Protein 6.3 (*)    Albumin 2.9 (*)    All other components within normal limits  LIPASE, BLOOD  TROPONIN I (HIGH SENSITIVITY)  TROPONIN I (HIGH SENSITIVITY)    ____________________________________________  EKG: Sinus tachycardia, ventricular rate 108.  PR 147, QRS 84, QTc 433.  No acute ST elevations or depressions.  EKG evidence of acute ischemia or infarct. ________________________________________  RADIOLOGY All imaging, including plain films, CT scans, and ultrasounds, independently reviewed by me, and interpretations confirmed via formal radiology reads.  ED MD interpretation:   Chest x-ray: Clear with exception of left hilar distortion masslike appearance, which is unchanged from previous.  Official radiology report(s): DG Chest 2 View  Result Date: 11/13/2020 CLINICAL DATA:  A 70 year old male presents with chest pain in the setting of ongoing systemic therapy for pulmonary neoplasm. EXAM: CHEST - 2 VIEW COMPARISON:  October 30, 2020. FINDINGS: RIGHT Port-A-Cath in-situ, tip at the upper portion of the RIGHT atrium as on previous imaging. Stable LEFT perihilar masslike area with no new areas of consolidation. No visible pneumothorax. No sign of pleural effusion. On limited assessment no acute skeletal process. IMPRESSION: Stable chest x-ray. No acute changes with unchanged appearance of LEFT hilar distortion and masslike appearance. Electronically Signed   By: Zetta Bills M.D.   On: 11/13/2020 14:06   CT Angio Chest PE W and/or Wo Contrast  Result Date: 11/13/2020 CLINICAL DATA:  70 year old male with history of chest pain and shortness of breath. History of lung cancer status post chemotherapy and radiation therapy (complete 10 months ago). Recent diagnosis of COVID infection 10/20/2020. EXAM: CT ANGIOGRAPHY CHEST WITH CONTRAST TECHNIQUE: Multidetector CT imaging  of the chest was performed using the standard protocol during bolus administration of intravenous contrast. Multiplanar CT image reconstructions and MIPs were obtained to evaluate  the vascular anatomy. CONTRAST:  61mL OMNIPAQUE IOHEXOL 350 MG/ML SOLN COMPARISON:  Multiple priors, most recently chest CTA 11/04/2020. FINDINGS: Cardiovascular: No filling defects within the pulmonary arterial tree to suggest pulmonary embolism. Heart size is normal. There is no significant pericardial fluid, thickening or pericardial calcification. Aortic atherosclerosis. No definite coronary artery calcifications. Right single-lumen power porta cath with tip terminating in the right atrium. Mediastinum/Nodes: No pathologically enlarged mediastinal or right hilar lymph nodes. Prominent soft tissue in the left hilar region, similar to prior studies. Esophagus is unremarkable in appearance. No axillary lymphadenopathy. Lungs/Pleura: Increasingly bulky mass-like area of architectural distortion in the left lung, most evident in the central aspect of the left upper lobe which has clearly progressed when compared to prior examinations dating back to 07/17/2020, with increasingly convex margins, concerning for locally recurrent disease. This is contiguous with bulky soft tissue in the left hilar region. Trace left pleural effusion lying dependently. Right lung is clear. No right pleural effusion. Upper Abdomen: 1.1 cm low-attenuation lesion in the upper pole the left kidney is compatible with a simple cyst. Musculoskeletal: There are no aggressive appearing lytic or blastic lesions noted in the visualized portions of the skeleton. Review of the MIP images confirms the above findings. IMPRESSION: 1. No evidence of pulmonary embolism. 2. Increasingly bulky mass-like architectural distortion throughout the left lung, most evident in the central aspect of the left upper lobe where there are increasingly convex margins, concerning for locally  recurrent disease. Further evaluation with PET-CT is recommended in the near future to better evaluate these findings. 3. Trace left pleural effusion lying dependently. Electronically Signed   By: Vinnie Langton M.D.   On: 11/13/2020 15:56    ____________________________________________  PROCEDURES   Procedure(s) performed (including Critical Care):  Procedures  ____________________________________________  INITIAL IMPRESSION / MDM / [place] / ED COURSE  As part of my medical decision making, I reviewed the following data within the [place] notes reviewed and incorporated, Old chart reviewed, Notes from prior ED visits, and [place] Controlled Substance Database       *Mitchell Frye was evaluated in Emergency Department on 11/13/2020 for the symptoms described in the history of present illness. He was evaluated in the context of the global COVID-19 pandemic, which necessitated consideration that the patient might be at risk for infection with the SARS-CoV-2 virus that causes COVID-19. Institutional protocols and algorithms that pertain to the evaluation of patients at risk for COVID-19 are in a state of rapid change based on information released by regulatory bodies including the CDC and federal and state organizations. These policies and algorithms were followed during the patient's care in the ED.  Some ED evaluations and interventions may be delayed as a result of limited staffing during the pandemic.*     Medical Decision Making:  70 year old male here with significant abdominal pain and discomfort.  Patient has a history of radiation esophagitis and recurrent chest pain related to this.  Differential includes esophagitis, esophageal spasm, less likely ACS as EKG is nonischemic and initial troponin is negative.  Patient does have a pleuritic component as well, however, so will check a CT angio.  CBC without significant abnormality.  BMP unremarkable.   LFTs and lipase are normal.  Plan to follow-up CT, if unremarkable and symptoms are improving, could likely increase antacid regimen at home with GI follow-up.  ____________________________________________  FINAL CLINICAL IMPRESSION(S) / ED DIAGNOSES  Final diagnoses:  None     MEDICATIONS  GIVEN DURING THIS VISIT:  Medications  HYDROmorphone (DILAUDID) injection 1 mg (1 mg Intravenous Given 11/13/20 1424)  alum & mag hydroxide-simeth (MAALOX/MYLANTA) 200-200-20 MG/5ML suspension 30 mL (30 mLs Oral Given 11/13/20 1429)    And  lidocaine (XYLOCAINE) 2 % viscous mouth solution 15 mL (15 mLs Oral Given 11/13/20 1429)  ondansetron (ZOFRAN) injection 4 mg (4 mg Intravenous Given 11/13/20 1423)  iohexol (OMNIPAQUE) 350 MG/ML injection 75 mL (75 mLs Intravenous Contrast Given 11/13/20 1513)     ED Discharge Orders     None        Note:  This document was prepared using Dragon voice recognition software and may include unintentional dictation errors.   Duffy Bruce, MD 11/13/20 [phone]

## 2020-11-13 NOTE — ED Provider Notes (Signed)
4:07 PM Assumed care for off going team.   Blood pressure (!) 147/89, pulse 95, temperature 98.8 F (37.1 C), temperature source Oral, resp. rate (!) 24, height 5\' 10"  (1.778 m), weight 82 kg, SpO2 95 %.  See their HPI for full report but in brief  lung cancer getting chemo and concern for reflux, not able to eat or drink, tachypnea-- radiation esophagitis vs PE.  Consider discharge if troponins negative, CT is negative and patient can tolerate p.o.  5:26 PM reevaluated patient.  Patient's been tolerating p.o. with some crackers and some applesauce and some water.  The patient feels comfortable going home.  We will start him on Carafate given he is already on high-dose Protonix.  Patient was given GI number for follow-up.  I explained to patient that he is to talk with his oncology team to help adjust his pain medications given this is probably just secondary to his known esophagitis.  I also provided him a copy of his CT report given the concern for possible worsening cancer and I did verbalize this with patient.   I discussed the provisional nature of ED diagnosis, the treatment so far, the ongoing plan of care, follow up appointments and return precautions with the patient and any family or support people present. They expressed understanding and agreed with the plan, discharged home.           Vanessa [place], MD 11/13/20 1728

## 2020-11-13 NOTE — ED Triage Notes (Signed)
Pt to ED from cancer center. Pt arrived to cancer center today c/o chest pain. Pt stated that it felt like reflux. Pt was treated reflux at cancer center and his pain improved. Pt did his chemo treatment. After finishing chemo treatment pts pain became worse. Pt was brought to ED for evaluation. Pt is A & O. Pt c/o central chest pain that is 10/10. Pt denies radiation of the pain. Pt does have increased cough.

## 2020-11-13 NOTE — Progress Notes (Signed)
Pt received IV pepcid, GI cocktail of mylanta, simethicone, lidocaine orally. He received 20 mEq of Potassium IV. He received his Imfinzi chemotherapy as well. He felt better for a couple of hours with the epigastric pain. At time of discharge he states he is hurting in his chest with epigastric pain as bad as ever. He doesn't want to go home. He is requesting to be taken to the ED. Port remains accessed. RN taking pt to ED.

## 2020-11-13 NOTE — Discharge Instructions (Addendum)
Your work-up was reassuring I think this is just from your esophagitis.  Make sure you are taking the medications that were prescribed to you including the Carafate and the Protonix.  These will help.  Also you need to talk to your oncology team may be the need to increase or change her pain medication regimen.  If you are unable to tolerate eating anything or your symptoms get worse she can return to the ER at that time.  However there was no signs of a heart attack or blood clot at this time.  Your CT scan is as below and you can discuss this with your oncologist   1. No evidence of pulmonary embolism.  2. Increasingly bulky mass-like architectural distortion throughout  the left lung, most evident in the central aspect of the left upper  lobe where there are increasingly convex margins, concerning for  locally recurrent disease. Further evaluation with PET-CT is  recommended in the near future to better evaluate these findings.  3. Trace left pleural effusion lying dependently.

## 2020-11-13 NOTE — Progress Notes (Signed)
Radiation Oncology Follow up Note  Name: Mitchell Frye   Date:   11/13/2020 MRN:  [id number] DOB: 1951/01/29    This 70 y.o. 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  REFERRING PROVIDER: [facility]*  HPI: Patient is a 70 year old male now out 10 months having completed combined modality therapy for stage IIIb adenocarcinoma left hilum.  He was seen today in a procedure room he is having significant pain from esophagitis.  They have treated this before he is seen today he is going to receive proton pump inhibitors as well as pain medication.  He specifically [place] cough hemoptysis or chest tightness..  He had a CT scan this month showing no significant acute central or proximal hilar PE stable left lung poor perihilar consolidation bandlike areas of scarring compatible with posttreatment changes.  I have reviewed those films.  Patient has also been receivingadjuvant durvalumab through medical oncology.  COMPLICATIONS OF TREATMENT: none  FOLLOW UP COMPLIANCE: keeps appointments   PHYSICAL EXAM:  There were no vitals taken for this visit. Patient is receiving infusion at this time is in significant pain.  Well-developed well-nourished patient in NAD. HEENT reveals PERLA, EOMI, discs not visualized.  Oral cavity is clear. No oral mucosal lesions are identified. Neck is clear without evidence of cervical or supraclavicular adenopathy. Lungs are clear to A&P. Cardiac examination is essentially unremarkable with regular rate and rhythm without murmur rub or thrill. Abdomen is benign with no organomegaly or masses noted. Motor sensory and DTR levels are equal and symmetric in the upper and lower extremities. Cranial nerves II through XII are grossly intact. Proprioception is intact. No peripheral adenopathy or edema is identified. No  motor or sensory levels are noted. Crude visual fields are within normal range.  RADIOLOGY RESULTS: CT scans reviewed compatible with above-stated findings  PLAN: Present time he is under assistance of palliative care.  He is also been receiving cycle 15 ofadjuvant durvalumab.  He is continues to be treated for his esophagitis.  I have asked to see him back in 1 year for follow-up.  Patient knows to call with any concerns.  I would like to take this opportunity to thank you for allowing me to participate in the care of your patient.Noreene Filbert, MD

## 2020-11-13 NOTE — Patient Instructions (Addendum)
[facility]  Discharge Instructions: Thank you for choosing [facility] to provide your oncology and hematology care.  If you have a lab appointment with the [facility], please go directly to the [facility] and check in at the registration area.  Wear comfortable clothing and clothing appropriate for easy access to any Portacath or PICC line.   We strive to give you quality time with your provider. You may need to reschedule your appointment if you arrive late (15 or more minutes).  Arriving late affects you and other patients whose appointments are after yours.  Also, if you miss three or more appointments without notifying the office, you may be dismissed from the clinic at the provider's discretion.      For prescription refill requests, have your pharmacy contact our office and allow 72 hours for refills to be completed.    Today you received the following chemotherapy and/or immunotherapy agents  Durvalumab      To help prevent nausea and vomiting after your treatment, we encourage you to take your nausea medication as directed.  BELOW ARE SYMPTOMS THAT SHOULD BE REPORTED IMMEDIATELY: *FEVER GREATER THAN 100.4 F (38 C) OR HIGHER *CHILLS OR SWEATING *NAUSEA AND VOMITING THAT IS NOT CONTROLLED WITH YOUR NAUSEA MEDICATION *UNUSUAL SHORTNESS OF BREATH *UNUSUAL BRUISING OR BLEEDING *URINARY PROBLEMS (pain or burning when urinating, or frequent urination) *BOWEL PROBLEMS (unusual diarrhea, constipation, pain near the anus) TENDERNESS IN MOUTH AND THROAT WITH OR WITHOUT PRESENCE OF ULCERS (sore throat, sores in mouth, or a toothache) UNUSUAL RASH, SWELLING OR PAIN  UNUSUAL VAGINAL DISCHARGE OR ITCHING   Items with * indicate a potential emergency and should be followed up as soon as possible or go to the Emergency Department if any problems should occur.  Please show the CHEMOTHERAPY ALERT CARD or IMMUNOTHERAPY ALERT CARD at check-in  to the Emergency Department and triage nurse.  Should you have questions after your visit or need to cancel or reschedule your appointment, please contact [facility]  [phone] and follow the prompts.  Office hours are 8:00 a.m. to 4:30 p.m. Monday - Friday. Please note that voicemails left after 4:00 p.m. may not be returned until the following business day.  We are closed weekends and major holidays. You have access to a nurse at all times for urgent questions. Please call the main number to the clinic [phone] and follow the prompts.  For any non-urgent questions, you may also contact your provider using MyChart. We now offer e-Visits for anyone 79 and older to request care online for non-urgent symptoms. For details visit [email or web address].   Also download the MyChart app! Go to the app store, search "MyChart", open the app, select [facility], and log in with your MyChart username and password.  Due to Covid, a mask is required upon entering the hospital/clinic. If you do not have a mask, one will be given to you upon arrival. For doctor visits, patients may have 1 support person aged 68 or older with them. For treatment visits, patients cannot have anyone with them due to current Covid guidelines and our immunocompromised population. Potassium Chloride Injection What is this medication? POTASSIUM CHLORIDE (poe TASS i um KLOOR ide) prevents and treats low levels of potassium in your body. Potassium plays an important role in maintaining the health of your kidneys, heart, muscles, and nervous system. This medicine may be used for other purposes; ask your health care provider or  pharmacist if you have questions. COMMON BRAND NAME(S): PROAMP What should I tell my care team before I take this medication? They need to know if you have any of these conditions: Addison disease Dehydration Diabetes (high blood sugar) Heart disease High levels of  potassium in the blood Irregular heartbeat or rhythm Kidney disease Large areas of burned skin An unusual or allergic reaction to potassium, other medications, foods, dyes, or preservatives Pregnant or trying to get pregnant Breast-feeding How should I use this medication? This medication is injected into a vein. It is given in a hospital or clinic setting. Talk to your care team about the use of this medication in children. Special care may be needed. Overdosage: If you think you have taken too much of this medicine contact a poison control center or emergency room at once. NOTE: This medicine is only for you. Do not share this medicine with others. What if I miss a dose? This does not apply. This medication is not for regular use. What may interact with this medication? Do not take this medication with any of the following: Certain diuretics such as spironolactone, triamterene Eplerenone Sodium polystyrene sulfonate This medication may also interact with the following: Certain medications for blood pressure or heart disease like lisinopril, losartan, quinapril, valsartan Medications that lower your chance of fighting infection such as cyclosporine, tacrolimus NSAIDs, medications for pain and inflammation, like ibuprofen or naproxen Other potassium supplements Salt substitutes This list may not describe all possible interactions. Give your health care provider a list of all the medicines, herbs, non-prescription drugs, or dietary supplements you use. Also tell them if you smoke, drink alcohol, or use illegal drugs. Some items may interact with your medicine. What should I watch for while using this medication? Visit your care team for regular checks on your progress. Tell your care team if your symptoms do not start to get better or if they get worse. You may need blood work while you are taking this medication. Avoid salt substitutes unless you are told otherwise by your care  team. What side effects may I notice from receiving this medication? Side effects that you should report to your care team as soon as possible: Allergic reactions-skin rash, itching, hives, swelling of the face, lips, tongue, or throat High potassium level-muscle weakness, fast or irregular heartbeat Side effects that usually do not require medical attention (report to your care team if they continue or are bothersome): Diarrhea Nausea Stomach pain Vomiting This list may not describe all possible side effects. Call your doctor for medical advice about side effects. You may report side effects to FDA at 1-800-FDA-1088. Where should I keep my medication? This medication is given in a hospital or clinic. It will not be stored at home. NOTE: This sheet is a summary. It may not cover all possible information. If you have questions about this medicine, talk to your doctor, pharmacist, or health care provider.  2022 Elsevier/Gold Standard (2020-05-07 11:17:38)

## 2020-11-13 NOTE — Progress Notes (Signed)
Per MD ok to treat with HR 110

## 2020-11-13 NOTE — Progress Notes (Signed)
Pt having chest pain that he describes it as acid reflux but never took reflux drugs today. Took pain pill and keeps making sounds of pain.

## 2020-11-20 NOTE — Progress Notes (Signed)
Agree with the details of the visit as noted by Elizabeth Walsh, NP.  C. Laura Von Quintanar, MD [facility] 

## 2020-11-25 ENCOUNTER — Emergency Department: Payer: Medicaid Other

## 2020-11-25 ENCOUNTER — Other Ambulatory Visit: Payer: Self-pay

## 2020-11-25 ENCOUNTER — Telehealth: Payer: Self-pay | Admitting: Cardiology

## 2020-11-25 ENCOUNTER — Encounter: Payer: Self-pay | Admitting: *Deleted

## 2020-11-25 ENCOUNTER — Emergency Department
Admission: EM | Admit: 2020-11-25 | Discharge: 2020-11-25 | Disposition: A | Discharge status: Home / Self Care | Payer: Medicaid Other | Attending: Emergency Medicine | Admitting: Emergency Medicine

## 2020-11-25 ENCOUNTER — Other Ambulatory Visit: Payer: Self-pay | Admitting: Oncology

## 2020-11-25 DIAGNOSIS — G893 Neoplasm related pain (acute) (chronic): Secondary | ICD-10-CM

## 2020-11-25 DIAGNOSIS — R0789 Other chest pain: Secondary | ICD-10-CM | POA: Diagnosis not present

## 2020-11-25 DIAGNOSIS — Z85118 Personal history of other malignant neoplasm of bronchus and lung: Secondary | ICD-10-CM | POA: Insufficient documentation

## 2020-11-25 DIAGNOSIS — I10 Essential (primary) hypertension: Secondary | ICD-10-CM | POA: Diagnosis not present

## 2020-11-25 DIAGNOSIS — Z87891 Personal history of nicotine dependence: Secondary | ICD-10-CM | POA: Diagnosis not present

## 2020-11-25 DIAGNOSIS — C3490 Malignant neoplasm of unspecified part of unspecified bronchus or lung: Secondary | ICD-10-CM

## 2020-11-25 LAB — COMPREHENSIVE METABOLIC PANEL
ALT: 22 U/L (ref 0–44)
AST: 20 U/L (ref 15–41)
Albumin: 2.8 g/dL — ABNORMAL LOW (ref 3.5–5.0)
Alkaline Phosphatase: 44 U/L (ref 38–126)
Anion gap: 11 (ref 5–15)
BUN: 17 mg/dL (ref 8–23)
CO2: 21 mmol/L — ABNORMAL LOW (ref 22–32)
Calcium: 8.7 mg/dL — ABNORMAL LOW (ref 8.9–10.3)
Chloride: 105 mmol/L (ref 98–111)
Creatinine, Ser: 0.81 mg/dL (ref 0.61–1.24)
GFR, Estimated: >60 mL/min (ref >60)
Glucose, Bld: 132 mg/dL — ABNORMAL HIGH (ref 70–99)
Potassium: 3.5 mmol/L (ref 3.5–5.1)
Sodium: 137 mmol/L (ref 135–145)
Total Bilirubin: 1 mg/dL (ref 0.3–1.2)
Total Protein: 7 g/dL (ref 6.5–8.1)

## 2020-11-25 LAB — TROPONIN I (HIGH SENSITIVITY)
Troponin I (High Sensitivity): 11 ng/L (ref <18)
Troponin I (High Sensitivity): 11 ng/L (ref <18)

## 2020-11-25 LAB — TSH: TSH: 0.558 u[IU]/mL (ref 0.350–4.500)

## 2020-11-25 LAB — T4, FREE: Free T4: 1.4 ng/dL — ABNORMAL HIGH (ref 0.61–1.12)

## 2020-11-25 LAB — CBC
HCT: 33 % — ABNORMAL LOW (ref 39.0–52.0)
Hemoglobin: 10.8 g/dL — ABNORMAL LOW (ref 13.0–17.0)
MCH: 25.7 pg — ABNORMAL LOW (ref 26.0–34.0)
MCHC: 32.7 g/dL (ref 30.0–36.0)
MCV: 78.4 fL — ABNORMAL LOW (ref 80.0–100.0)
Platelets: 593 10*3/uL — ABNORMAL HIGH (ref 150–400)
RBC: 4.21 MIL/uL — ABNORMAL LOW (ref 4.22–5.81)
RDW: 18.8 % — ABNORMAL HIGH (ref 11.5–15.5)
WBC: 8.4 10*3/uL (ref 4.0–10.5)
nRBC: 0 % (ref 0.0–0.2)

## 2020-11-25 LAB — LIPASE, BLOOD: Lipase: 36 U/L (ref 11–51)

## 2020-11-25 LAB — D-DIMER, QUANTITATIVE: D-Dimer, Quant: 0.58 ug/mL-FEU — ABNORMAL HIGH (ref 0.00–0.50)

## 2020-11-25 MED ORDER — FENTANYL CITRATE PF 50 MCG/ML IJ SOSY
50.0000 ug | PREFILLED_SYRINGE | Freq: Once | INTRAMUSCULAR | Status: AC
Start: 2020-11-25 — End: 2020-11-25
  Administered 2020-11-25: 50 ug via INTRAVENOUS
  Filled 2020-11-25: qty 1

## 2020-11-25 MED ORDER — IOHEXOL 350 MG/ML SOLN
75.0000 mL | Freq: Once | INTRAVENOUS | Status: AC | PRN
  Administered 2020-11-25: 75 mL via INTRAVENOUS

## 2020-11-25 MED ORDER — OXYCODONE HCL 10 MG PO TABS: 10.0000 mg | ORAL_TABLET | ORAL | 0 refills | Status: DC | PRN

## 2020-11-25 MED ORDER — ONDANSETRON HCL 4 MG/2ML IJ SOLN
4.0000 mg | Freq: Once | INTRAMUSCULAR | Status: AC
  Administered 2020-11-25: 4 mg via INTRAVENOUS
  Filled 2020-11-25: qty 2

## 2020-11-25 NOTE — Telephone Encounter (Signed)
Scheduled cardio consult Monday 10-17 at arranged for [facility] transportation to pick up patient .   Spoke with christian at call center for transportation.

## 2020-11-25 NOTE — ED Provider Notes (Signed)
[facility] Emergency Department Provider Note  ____________________________________________  Time seen: Approximately 5:38 AM  I have reviewed the triage vital signs and the nursing notes.   HISTORY  Chief Complaint Chest Pain   HPI Mitchell Frye is a 70 y.o. male with a history of lung cancer, hypertension, hyperlipidemia who presents for evaluation of chest pain.  Patient reports that he has had this chest pain for months.  The pain is attributed to his lung cancer.  He is currently on chemotherapy.  He reports that this evening the pain was more severe and accompanied by some pounding in his chest which made him concerned.  He denies any changes in his chronic shortness of breath, hemoptysis, cough, personal or family history of PE or DVT, recent travel immobilization, leg pain or swelling, hemoptysis, or exogenous hormones.  Patient reports that he is running out of the his oxycodone.  He took some at home with no significant relief.  Denies abdominal pain, nausea or vomiting, fever or chills.   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   B12 deficiency 11/04/2019   Goals of care, counseling/discussion 10/04/2019   Malignant neoplasm of lower lobe of left lung ([facility]) 10/04/2019    Past Surgical History:  Procedure Laterality Date   PORTA CATH INSERTION N/A 10/22/2019   Procedure: PORTA CATH INSERTION;  Surgeon: Katha Cabal, MD;  Location: [facility];  Service: Cardiovascular;  Laterality: N/A;   VIDEO BRONCHOSCOPY WITH ENDOBRONCHIAL ULTRASOUND N/A 09/25/2019   Procedure: VIDEO BRONCHOSCOPY WITH ENDOBRONCHIAL ULTRASOUND;  Surgeon: Tyler Pita, MD;  Location: [facility] ORS;  Service: Pulmonary;  Laterality: N/A;     Prior to Admission medications   Medication Sig Start Date End Date Taking? Authorizing Provider  acetaminophen (TYLENOL) 500 MG tablet Take 1,000 mg by mouth every 6 (six) hours as needed for mild pain, moderate pain or headache. Patient not taking: No sig reported    [provider]  albuterol (VENTOLIN HFA) 108 (90 Base) MCG/ACT inhaler Inhale 2 puffs into the lungs every 4 (four) hours as needed for wheezing or shortness of breath. 11/05/20   Borders, Kirt Boys, NP  benzonatate (TESSALON PERLES) 100 MG capsule Take 1 capsule (100 mg total) by mouth every 6 (six) hours as needed for cough. Patient not taking: No sig reported 09/25/20 09/25/21  Sindy Guadeloupe, MD  Budeson-Glycopyrrol-Formoterol (BREZTRI AEROSPHERE) 160-9-4.8 MCG/ACT AERO Inhale 2 puffs into the lungs in the morning and at bedtime. 11/13/20   Martyn Ehrich, NP  clobetasol cream (TEMOVATE) 0.05 % Apply topically. 10/28/20   [provider]  famotidine (PEPCID) 20 MG tablet Take 1 tablet (20 mg total) by mouth daily. 10/31/20 10/31/21  Nance Pear, MD  HYDROcodone bit-homatropine (HYDROMET) 5-1.5 MG/5ML syrup Take 5 mLs by mouth every 6 (six) hours as needed for cough. 10/02/20   Sindy Guadeloupe, MD  hydrOXYzine (ATARAX/VISTARIL) 25 MG tablet Take 25 mg by mouth daily. 06/11/20   [provider]  lidocaine (XYLOCAINE) 2 % solution Use as directed 15 mLs in the mouth or throat every 6 (six) hours as needed (abdominal pain). 10/31/20   Nance Pear, MD  lidocaine-prilocaine (EMLA) cream Apply 1 application topically once. 12/18/19  [provider]  oxyCODONE 10 MG TABS Take 1 tablet (10 mg total) by mouth every 4 (four) hours as needed for severe pain. 11/25/20   Rudene Re, MD  pantoprazole (PROTONIX) 40 MG tablet Take 1 tablet (40mg ) twice daily for two weeks and then take 1 tablet daily thereafter 11/05/20   Borders, Kirt Boys, NP  potassium chloride SA (KLOR-CON) 20 MEQ tablet TAKE 1  TABLET(20 MEQ) BY MOUTH TWICE DAILY 11/01/20   Sindy Guadeloupe, MD  sucralfate (CARAFATE) 1 g tablet Take 1 tablet (1 g total) by mouth 4 (four) times daily. 11/05/20   Borders, Kirt Boys, NP  traZODone (DESYREL) 50 MG tablet TAKE 1 TABLET(50 MG) BY MOUTH AT BEDTIME Patient not taking: No sig reported 04/23/20   Sindy Guadeloupe, MD  prochlorperazine (COMPAZINE) 10 MG tablet TAKE 1 TABLET(10 MG) BY MOUTH EVERY 6 HOURS AS NEEDED FOR NAUSEA OR VOMITING 10/22/19 11/25/19  Sindy Guadeloupe, MD    Allergies Taxol [paclitaxel]  Family History  Problem Relation Age of Onset   Cancer Brother     Social History Social History   Tobacco Use   Smoking status: Former    Packs/day: 1.00    Years: 20.00    Pack years: 20.00    Types: Cigarettes    Quit date: 1994    Years since quitting: 28.7   Smokeless tobacco: Never  Vaping Use   Vaping Use: Never used  Substance Use Topics   Alcohol use: Not Currently    Comment: not drank any beer in 2 onths   Drug use: Never    Review of Systems  Constitutional: Negative for fever. Eyes: Negative for visual changes. ENT: Negative for sore throat. Neck: No neck pain  Cardiovascular: + chest pain. Respiratory: Negative for shortness of breath. Gastrointestinal: Negative for abdominal pain, vomiting or diarrhea. Genitourinary: Negative for dysuria. Musculoskeletal: Negative for back pain. Skin: Negative for rash. Neurological: Negative for headaches, weakness or numbness. Psych: No SI or HI  ____________________________________________   PHYSICAL EXAM:  VITAL SIGNS: Vitals:   11/25/20 0525 11/25/20 0542  BP: 118/80 115/81  Pulse: 73 77  Resp: (!) 22 (!) 28  Temp:    SpO2: 98% 96%     Constitutional: Alert and oriented, looks uncomfortable due to pain HEENT:      Head: Normocephalic and atraumatic.         Eyes: Conjunctivae are normal. Sclera is non-icteric.       Mouth/Throat: Mucous membranes are moist.       Neck: Supple with no  signs of meningismus. Cardiovascular: Regular rate and rhythm. No murmurs, gallops, or rubs. 2+ symmetrical distal pulses are present in all extremities. No JVD. Respiratory: Slightly tachypneic but not hypoxic with some faint crackles on the left base Gastrointestinal: Soft, non tender, and non distended with positive bowel sounds. No rebound or guarding. Genitourinary: No CVA tenderness. Musculoskeletal:  No edema, cyanosis, or erythema of extremities. Neurologic: Normal speech and language. Face is symmetric. Moving all extremities. No gross focal neurologic deficits are appreciated. Skin: Skin is warm, dry and intact. No rash noted. Psychiatric: Mood and affect are normal. Speech and behavior are normal.  ____________________________________________   LABS (all labs ordered are listed, but only abnormal results are displayed)  Labs Reviewed  CBC - Abnormal; Notable for the following components:      Result Value   RBC 4.21 (*)    Hemoglobin 10.8 (*)    HCT 33.0 (*)  MCV 78.4 (*)    MCH 25.7 (*)    RDW 18.8 (*)    Platelets 593 (*)    All other components within normal limits  COMPREHENSIVE METABOLIC PANEL - Abnormal; Notable for the following components:   CO2 21 (*)    Glucose, Bld 132 (*)    Calcium 8.7 (*)    Albumin 2.8 (*)    All other components within normal limits  T4, FREE - Abnormal; Notable for the following components:   Free T4 1.40 (*)    All other components within normal limits  D-DIMER, QUANTITATIVE - Abnormal; Notable for the following components:   D-Dimer, Quant 0.58 (*)    All other components within normal limits  LIPASE, BLOOD  TSH  TROPONIN I (HIGH SENSITIVITY)  TROPONIN I (HIGH SENSITIVITY)   ____________________________________________  EKG  ED ECG REPORT I, Rudene Re, the attending physician, personally viewed and interpreted this ECG.  Sinus tachycardia, rate of 129, left axis deviation, no ST elevations or depressions, Q  waves in inferior leads.  No significant changes when compared to prior. ____________________________________________  RADIOLOGY  I have personally reviewed the images performed during this visit and I agree with the Radiologist's read.   Interpretation by Radiologist:  DG Chest 2 View  Result Date: 11/25/2020 CLINICAL DATA:  Chest pain EXAM: CHEST - 2 VIEW COMPARISON:  11/13/2020 FINDINGS: Masslike consolidation within the left lung base is again seen and appears stable since prior examination. Right lung is clear. No pneumothorax or pleural effusion. Right internal jugular chest port is seen with its tip at the superior right atrium. Cardiac size within normal limits. No acute bone abnormality. IMPRESSION: Stable masslike consolidation within the left lower lobe. Electronically Signed   By: Fidela Salisbury M.D.   On: 11/25/2020 01:29   CT Angio Chest PE W and/or Wo Contrast  Result Date: 11/25/2020 CLINICAL DATA:  Three-month history of chest pain. History of lung cancer. EXAM: CT ANGIOGRAPHY CHEST WITH CONTRAST TECHNIQUE: Multidetector CT imaging of the chest was performed using the standard protocol during bolus administration of intravenous contrast. Multiplanar CT image reconstructions and MIPs were obtained to evaluate the vascular anatomy. CONTRAST:  54mL OMNIPAQUE IOHEXOL 350 MG/ML SOLN COMPARISON:  11/13/2020 FINDINGS: Cardiovascular: The heart is mildly enlarged but stable. No pericardial effusion. Mild tortuosity of the thoracic aorta but no aneurysm or dissection. Minimal atherosclerotic calcification at the aortic arch. The branch vessels are normal. No obvious coronary artery calcifications. The pulmonary arterial tree is fairly well opacified. No findings suspicious for pulmonary embolism. Extensive tumor surrounding the left pulmonary arteries. Mediastinum/Nodes: Persistent ill-defined soft tissue density involving the left hilar region and subcarinal space likely infiltrating tumor.  Small stable right-sided hilar lymph nodes. Lungs/Pleura: Persistent and slightly progressive ill-defined soft tissue density involving the left hilum and left paramediastinal lung surrounding the left mainstem bronchus and left pulmonary artery consistent with recurrent tumor. There is also surrounding interstitial thickening and vague nodularity the left upper lobe and left lower lobe which could be obstructive pneumonitis or interstitial spread of tumor. The right lung remains relatively clear except for streaky right basilar atelectasis. No pulmonary lesions. No pleural effusions. Upper Abdomen: No significant upper abdominal findings. No hepatic or adrenal gland lesions. No upper abdominal adenopathy. Bilateral renal cysts are noted. Musculoskeletal: No significant bony findings. Review of the MIP images confirms the above findings. IMPRESSION: 1. No CT findings for pulmonary embolism. 2. Persistent and slightly progressive ill-defined soft tissue density involving the left  hilum and left paramediastinal lung most consistent with recurrent tumor. 3. Surrounding interstitial thickening and vague nodularity in the left upper lobe and left lower lobe could be obstructive pneumonitis or interstitial spread of tumor. 4. Left hilar and subcarinal adenopathy. 5. No findings for upper abdominal metastatic disease. Electronically Signed   By: Marijo Sanes M.D.   On: 11/25/2020 05:26     ____________________________________________   PROCEDURES  Procedure(s) performed:yes .1-3 Lead EKG Interpretation Performed by: Rudene Re, MD Authorized by: Rudene Re, MD     Interpretation: abnormal     ECG rate assessment: tachycardic     Rhythm: sinus tachycardia     Ectopy: none     Conduction: abnormal     Critical Care performed: yes  CRITICAL CARE Performed by: Rudene Re  ?  Total critical care time: 30 min  Critical care time was exclusive of separately billable procedures  and treating other patients.  Critical care was necessary to treat or prevent imminent or life-threatening deterioration.  Critical care was time spent personally by me on the following activities: development of treatment plan with patient and/or surrogate as well as nursing, discussions with consultants, evaluation of patient's response to treatment, examination of patient, obtaining history from patient or surrogate, ordering and performing treatments and interventions, ordering and review of laboratory studies, ordering and review of radiographic studies, pulse oximetry and re-evaluation of patient's condition.  ____________________________________________   INITIAL IMPRESSION / ASSESSMENT AND PLAN / ED COURSE  70 y.o. male with a history of lung cancer, hypertension, hyperlipidemia who presents for evaluation of chest pain.  Patient arrives with acute on chronic chest pain attributed to his cancer.  Felt some palpitations as well.  Patient looks uncomfortable due to pain, tachypneic but not hypoxic, with crackles on the lung base where he has known lung cancer.  His EKG was unchanged from baseline.  Ddx cancer related pain versus PE versus dissection versus pneumonia versus pneumothorax versus arrhythmia versus pleurisy  Chest x-ray showing a stable masslike consolidation in the left lower lobe.  D-dimer was elevated therefore patient was sent for CT to rule out PE.  That is negative just showing persistent and slightly progressive tumor in the lung.  No signs of pneumonia or pneumothorax.  No respiratory distress, no hypoxia, stable mild anemia and thrombocytosis, normal white count.  Chemistry panel was within normal limits.  2 high-sensitivity troponins with no signs of demand ischemia.  Patient was given a dose of fentanyl with markedly improved pain.  Patient is running out of his oxycodone and asked for refill.  I provided him 12 x 10 mg oxycodone and recommended that he contact his  oncologist before the weekend so he can had his prescription refilled.  Discussed my standard return precautions and follow-up.  Patient was monitored on telemetry with no signs of dysrhythmias.  Old medical records reviewed including most recent note from his oncologist      _____________________________________________ Please note:  Patient was evaluated in Emergency Department today for the symptoms described in the history of present illness. Patient was evaluated in the context of the global COVID-19 pandemic, which necessitated consideration that the patient might be at risk for infection with the SARS-CoV-2 virus that causes COVID-19. Institutional protocols and algorithms that pertain to the evaluation of patients at risk for COVID-19 are in a state of rapid change based on information released by regulatory bodies including the CDC and federal and state organizations. These policies and algorithms were followed during  the patient's care in the ED.  Some ED evaluations and interventions may be delayed as a result of limited staffing during the pandemic.   [place] Controlled Substance Database was reviewed by me. ____________________________________________   FINAL CLINICAL IMPRESSION(S) / ED DIAGNOSES   Final diagnoses:  Cancer-related pain      NEW MEDICATIONS STARTED DURING THIS VISIT:  ED Discharge Orders          Ordered    oxyCODONE 10 MG TABS  Every 4 hours PRN       Note to Pharmacy: [facility]   11/25/20 0538             Note:  This document was prepared using Dragon voice recognition software and may include unintentional dictation errors.    Rudene Re, MD 11/25/20 [phone]

## 2020-11-25 NOTE — ED Triage Notes (Signed)
Pt in with co chest pain x 3 months states has gotten worse tonight. Pt also co shob, pt seems anxious in triage.

## 2020-11-25 NOTE — Progress Notes (Signed)
o

## 2020-11-25 NOTE — Discharge Instructions (Addendum)
Return to the ER for new or worsening chest pain, shortness of breath or fever.  Otherwise follow-up with your oncologist.

## 2020-11-26 ENCOUNTER — Telehealth: Payer: Self-pay | Admitting: Pulmonary Disease

## 2020-11-26 NOTE — Telephone Encounter (Signed)
Per Dr. Patsey Berthold verbally- schedule OV after PET.  Will contact patient once PET is scheduled.

## 2020-11-27 NOTE — Progress Notes (Signed)
Per Dr. Janese Banks, pt needs PET scan for possible lung cancer recurrence. Insurance authorization currently pending. Once approved, will schedule pt and further follow up.

## 2020-11-30 ENCOUNTER — Inpatient Hospital Stay
Admission: EM | Admit: 2020-11-30 | Discharge: 2020-12-05 | DRG: 947 | Disposition: A | Discharge status: Home Health | Payer: Medicaid Other | Attending: Internal Medicine | Admitting: Internal Medicine

## 2020-11-30 ENCOUNTER — Other Ambulatory Visit: Payer: Self-pay

## 2020-11-30 ENCOUNTER — Emergency Department: Payer: Medicaid Other

## 2020-11-30 ENCOUNTER — Telehealth: Payer: Self-pay | Admitting: *Deleted

## 2020-11-30 ENCOUNTER — Ambulatory Visit: Payer: Medicaid Other | Admitting: Cardiology

## 2020-11-30 DIAGNOSIS — I10 Essential (primary) hypertension: Secondary | ICD-10-CM | POA: Diagnosis present

## 2020-11-30 DIAGNOSIS — R0789 Other chest pain: Secondary | ICD-10-CM

## 2020-11-30 DIAGNOSIS — I493 Ventricular premature depolarization: Secondary | ICD-10-CM | POA: Diagnosis present

## 2020-11-30 DIAGNOSIS — T66XXXA Radiation sickness, unspecified, initial encounter: Secondary | ICD-10-CM | POA: Diagnosis not present

## 2020-11-30 DIAGNOSIS — Z809 Family history of malignant neoplasm, unspecified: Secondary | ICD-10-CM

## 2020-11-30 DIAGNOSIS — K5521 Angiodysplasia of colon with hemorrhage: Secondary | ICD-10-CM | POA: Diagnosis present

## 2020-11-30 DIAGNOSIS — R1310 Dysphagia, unspecified: Secondary | ICD-10-CM | POA: Diagnosis not present

## 2020-11-30 DIAGNOSIS — R079 Chest pain, unspecified: Secondary | ICD-10-CM | POA: Diagnosis present

## 2020-11-30 DIAGNOSIS — G893 Neoplasm related pain (acute) (chronic): Secondary | ICD-10-CM | POA: Diagnosis not present

## 2020-11-30 DIAGNOSIS — T402X5A Adverse effect of other opioids, initial encounter: Secondary | ICD-10-CM | POA: Diagnosis not present

## 2020-11-30 DIAGNOSIS — K208 Other esophagitis without bleeding: Secondary | ICD-10-CM | POA: Diagnosis present

## 2020-11-30 DIAGNOSIS — C3432 Malignant neoplasm of lower lobe, left bronchus or lung: Secondary | ICD-10-CM | POA: Diagnosis present

## 2020-11-30 DIAGNOSIS — J449 Chronic obstructive pulmonary disease, unspecified: Secondary | ICD-10-CM | POA: Diagnosis present

## 2020-11-30 DIAGNOSIS — Z79899 Other long term (current) drug therapy: Secondary | ICD-10-CM

## 2020-11-30 DIAGNOSIS — E785 Hyperlipidemia, unspecified: Secondary | ICD-10-CM | POA: Diagnosis present

## 2020-11-30 DIAGNOSIS — Z7951 Long term (current) use of inhaled steroids: Secondary | ICD-10-CM

## 2020-11-30 DIAGNOSIS — D75838 Other thrombocytosis: Secondary | ICD-10-CM

## 2020-11-30 DIAGNOSIS — Z20822 Contact with and (suspected) exposure to covid-19: Secondary | ICD-10-CM | POA: Diagnosis present

## 2020-11-30 DIAGNOSIS — R54 Age-related physical debility: Secondary | ICD-10-CM | POA: Diagnosis present

## 2020-11-30 DIAGNOSIS — Z923 Personal history of irradiation: Secondary | ICD-10-CM

## 2020-11-30 DIAGNOSIS — K5900 Constipation, unspecified: Secondary | ICD-10-CM

## 2020-11-30 DIAGNOSIS — K5903 Drug induced constipation: Secondary | ICD-10-CM | POA: Diagnosis not present

## 2020-11-30 DIAGNOSIS — Y842 Radiological procedure and radiotherapy as the cause of abnormal reaction of the patient, or of later complication, without mention of misadventure at the time of the procedure: Secondary | ICD-10-CM | POA: Diagnosis present

## 2020-11-30 DIAGNOSIS — Z87891 Personal history of nicotine dependence: Secondary | ICD-10-CM

## 2020-11-30 DIAGNOSIS — Z515 Encounter for palliative care: Secondary | ICD-10-CM

## 2020-11-30 DIAGNOSIS — Z9221 Personal history of antineoplastic chemotherapy: Secondary | ICD-10-CM

## 2020-11-30 DIAGNOSIS — Z888 Allergy status to other drugs, medicaments and biological substances status: Secondary | ICD-10-CM

## 2020-11-30 LAB — BASIC METABOLIC PANEL
Anion gap: 11 (ref 5–15)
BUN: 17 mg/dL (ref 8–23)
CO2: 22 mmol/L (ref 22–32)
Calcium: 9.2 mg/dL (ref 8.9–10.3)
Chloride: 104 mmol/L (ref 98–111)
Creatinine, Ser: 0.82 mg/dL (ref 0.61–1.24)
GFR, Estimated: >60 mL/min (ref >60)
Glucose, Bld: 155 mg/dL — ABNORMAL HIGH (ref 70–99)
Potassium: 3.5 mmol/L (ref 3.5–5.1)
Sodium: 137 mmol/L (ref 135–145)

## 2020-11-30 LAB — CBC
HCT: 34.8 % — ABNORMAL LOW (ref 39.0–52.0)
Hemoglobin: 11.5 g/dL — ABNORMAL LOW (ref 13.0–17.0)
MCH: 25.6 pg — ABNORMAL LOW (ref 26.0–34.0)
MCHC: 33 g/dL (ref 30.0–36.0)
MCV: 77.3 fL — ABNORMAL LOW (ref 80.0–100.0)
Platelets: 614 10*3/uL — ABNORMAL HIGH (ref 150–400)
RBC: 4.5 MIL/uL (ref 4.22–5.81)
RDW: 18.8 % — ABNORMAL HIGH (ref 11.5–15.5)
WBC: 8.8 10*3/uL (ref 4.0–10.5)
nRBC: 0 % (ref 0.0–0.2)

## 2020-11-30 LAB — RESP PANEL BY RT-PCR (FLU A&B, COVID) ARPGX2
Influenza A by PCR: NEGATIVE
Influenza B by PCR: NEGATIVE
SARS Coronavirus 2 by RT PCR: NEGATIVE

## 2020-11-30 LAB — TROPONIN I (HIGH SENSITIVITY)
Troponin I (High Sensitivity): 10 ng/L (ref <18)
Troponin I (High Sensitivity): 10 ng/L (ref <18)

## 2020-11-30 MED ORDER — ONDANSETRON HCL 4 MG PO TABS: 4.0000 mg | ORAL_TABLET | Freq: Four times a day (QID) | ORAL | Status: DC | PRN

## 2020-11-30 MED ORDER — FAMOTIDINE IN NACL 20-0.9 MG/50ML-% IV SOLN
20.0000 mg | Freq: Once | INTRAVENOUS | Status: AC
  Administered 2020-11-30: 20 mg via INTRAVENOUS
  Filled 2020-11-30: qty 50

## 2020-11-30 MED ORDER — MORPHINE SULFATE (PF) 2 MG/ML IV SOLN
2.0000 mg | INTRAVENOUS | Status: DC | PRN
  Administered 2020-11-30 – 2020-12-02 (×6): 2 mg via INTRAVENOUS
  Filled 2020-11-30 (×6): qty 1

## 2020-11-30 MED ORDER — HYDROMORPHONE HCL 1 MG/ML IJ SOLN
1.0000 mg | Freq: Once | INTRAMUSCULAR | Status: AC
Start: 2020-11-30 — End: 2020-11-30
  Administered 2020-11-30: 1 mg via INTRAVENOUS
  Filled 2020-11-30: qty 1

## 2020-11-30 MED ORDER — BENZONATATE 100 MG PO CAPS: 100.0000 mg | ORAL_CAPSULE | Freq: Four times a day (QID) | ORAL | Status: DC | PRN

## 2020-11-30 MED ORDER — ENOXAPARIN SODIUM 40 MG/0.4ML IJ SOSY
40.0000 mg | PREFILLED_SYRINGE | INTRAMUSCULAR | Status: DC
  Administered 2020-11-30 – 2020-12-04 (×5): 40 mg via SUBCUTANEOUS
  Filled 2020-11-30 (×5): qty 0.4

## 2020-11-30 MED ORDER — ALUM & MAG HYDROXIDE-SIMETH 200-200-20 MG/5ML PO SUSP
30.0000 mL | Freq: Once | ORAL | Status: AC
  Administered 2020-11-30: 30 mL via ORAL
  Filled 2020-11-30: qty 30

## 2020-11-30 MED ORDER — MORPHINE SULFATE (PF) 4 MG/ML IV SOLN
4.0000 mg | Freq: Once | INTRAVENOUS | Status: AC
  Administered 2020-11-30: 4 mg via INTRAVENOUS
  Filled 2020-11-30: qty 1

## 2020-11-30 MED ORDER — ONDANSETRON HCL 4 MG/2ML IJ SOLN: 4.0000 mg | Freq: Four times a day (QID) | INTRAMUSCULAR | Status: DC | PRN

## 2020-11-30 MED ORDER — PANTOPRAZOLE SODIUM 40 MG IV SOLR
40.0000 mg | INTRAVENOUS | Status: DC
  Administered 2020-11-30: 40 mg via INTRAVENOUS
  Filled 2020-11-30: qty 40

## 2020-11-30 MED ORDER — MORPHINE SULFATE ER 15 MG PO TBCR
15.0000 mg | EXTENDED_RELEASE_TABLET | Freq: Two times a day (BID) | ORAL | Status: DC
  Administered 2020-11-30 – 2020-12-02 (×4): 15 mg via ORAL
  Filled 2020-11-30 (×4): qty 1

## 2020-11-30 MED ORDER — MAGIC MOUTHWASH W/LIDOCAINE
10.0000 mL | Freq: Four times a day (QID) | ORAL | Status: AC
  Administered 2020-11-30 – 2020-12-01 (×3): 10 mL via ORAL
  Filled 2020-11-30 (×6): qty 10

## 2020-11-30 MED ORDER — ACETAMINOPHEN 500 MG PO TABS
1000.0000 mg | ORAL_TABLET | Freq: Once | ORAL | Status: AC
  Administered 2020-11-30: 1000 mg via ORAL
  Filled 2020-11-30: qty 2

## 2020-11-30 MED ORDER — SODIUM CHLORIDE 0.9% FLUSH
3.0000 mL | Freq: Two times a day (BID) | INTRAVENOUS | Status: DC
  Administered 2020-11-30 – 2020-12-05 (×10): 3 mL via INTRAVENOUS

## 2020-11-30 MED ORDER — BUDESON-GLYCOPYRROL-FORMOTEROL 160-9-4.8 MCG/ACT IN AERO: 2.0000 | INHALATION_SPRAY | Freq: Every day | RESPIRATORY_TRACT | Status: DC

## 2020-11-30 MED ORDER — LIDOCAINE VISCOUS HCL 2 % MT SOLN
15.0000 mL | Freq: Once | OROMUCOSAL | Status: AC
  Administered 2020-11-30: 15 mL via ORAL
  Filled 2020-11-30: qty 15

## 2020-11-30 MED ORDER — HYDROXYZINE HCL 25 MG PO TABS: 25.0000 mg | ORAL_TABLET | Freq: Every day | ORAL | Status: DC

## 2020-11-30 MED ORDER — OXYCODONE HCL 5 MG PO TABS
10.0000 mg | ORAL_TABLET | Freq: Once | ORAL | Status: AC
Start: 2020-11-30 — End: 2020-11-30
  Administered 2020-11-30: 10 mg via ORAL
  Filled 2020-11-30: qty 2

## 2020-11-30 MED ORDER — OXYCODONE HCL 5 MG PO TABS
20.0000 mg | ORAL_TABLET | ORAL | Status: DC | PRN
Start: 2020-11-30 — End: 2020-12-03
  Administered 2020-12-01 – 2020-12-03 (×7): 20 mg via ORAL
  Filled 2020-11-30 (×8): qty 4

## 2020-11-30 MED ORDER — IOHEXOL 350 MG/ML SOLN
75.0000 mL | Freq: Once | INTRAVENOUS | Status: AC | PRN
  Administered 2020-11-30: 75 mL via INTRAVENOUS

## 2020-11-30 MED ORDER — SUCRALFATE 1 G PO TABS
1.0000 g | ORAL_TABLET | Freq: Four times a day (QID) | ORAL | Status: DC
  Administered 2020-11-30 – 2020-12-05 (×17): 1 g via ORAL
  Filled 2020-11-30 (×17): qty 1

## 2020-11-30 MED ORDER — ALBUTEROL SULFATE (2.5 MG/3ML) 0.083% IN NEBU
2.5000 mg | INHALATION_SOLUTION | RESPIRATORY_TRACT | Status: DC | PRN
  Administered 2020-12-03 – 2020-12-05 (×4): 2.5 mg via RESPIRATORY_TRACT
  Filled 2020-11-30 (×4): qty 3

## 2020-11-30 NOTE — Telephone Encounter (Signed)
Incoming call from answering service reporting that patient called them complaining of severe chest pain. I called patient back asking if this pain is different from his usual chest pain and he said yes, it is deeper and when asked if it radiates he said no, but when asked if he feels like something is heavy on his chest he said yes. I advised patient to hang up with me and call 911 immediately. He agreed to do so

## 2020-11-30 NOTE — Telephone Encounter (Signed)
PET scan is scheduled for 12/08/2020.  First available appt is 01/01/2021.  Dr. Patsey Berthold, please advise. Thanks

## 2020-11-30 NOTE — ED Notes (Addendum)
Pt to ED via [facility] c/o CP and acid reflux that started last night. Pt states his CP/ acid reflux woke him last night while he was trying to lay down. Pt states he has centralized CP.   Pt states he is also SOB, pt has had SOB for about 3 months. Pt has Hx of lung cancer.  Pt is increasingly restless and anxious upon assessment.  Pt is A&Ox4, NAD at this time.

## 2020-11-30 NOTE — H&P (Signed)
History and Physical    Mitchell Frye TDD:[id number] DOB: 06-03-1950 DOA: 11/30/2020  PCP: [facility]   Patient coming from: Home  I have personally briefly reviewed patient's old medical records in [place]  Chief Complaint: Chest pain  HPI: Mitchell Frye is a 70 y.o. male with medical history significant for hypertension, dyslipidemia, history of adenocarcinoma of the lung status post concurrent chemoradiation therapy and currently on MAB which he receives every 4 weeks, history of radiation esophagitis and hypertension. Patient presents to the emergency room and has been seen 4 times in the ER in the past 2 weeks for evaluation of chest pain which is mostly midsternal, described as a burning sensation and he thinks it is related to his history of GERD.  He states that his oncologist told him his pain was due to reflux and started him on some medicine which he said helped his symptoms but now he ran out of his PPI. He rates his pain a 10 x 10 in intensity at its worst, pain is non radiating and is associated with emesis and nausea. He received IV Pepcid and GI cocktail in the ER as well as IV morphine and Dilaudid without any significant improvement in his symptoms. He denies having any fever, no chills, no headache, no shortness of breath, no dizziness, no lightheadedness, no urinary symptoms, no changes in his bowel habits, no leg swelling, no focal deficit or blurred vision. Labs show sodium 137, potassium 3.5, chloride 104, bicarb 22, glucose 155, BUN 17, creatinine 0.82, calcium 9.2, troponin 10, white count 8.8, hemoglobin 11.5, hematocrit 34.8, MCV 77.3, RDW 18.8, platelet count 614 Respiratory viral panel is pending Chest x-ray reviewed by me shows unchanged masslike opacity projecting over the left midlung.  No new or progressive airspace disease. CT angiogram of the chest is negative for acute PE or thoracic aortic dissection but shows persistent left  hilar masslike process involving branches of the left pulmonary veins and extending to involve the inferior lingula and left lower lobe, suspicious for neoplasm.  No evidence of distant metastatic disease. Twelve-lead EKG reviewed by me shows sinus tachycardia with occasional PVCs.   ED Course: Patient is a 70 year old male with a history of adenocarcinoma of the lung currently on monoclonal antibody therapy who presents to the ER for evaluation of midsternal chest pain which she describes as a burning sensation that is nonradiating associated with nausea and vomiting. He received pain meds in the ER, GI cocktail and IV Pepcid without any improvement and will be referred to observation status for further evaluation and management.   Review of Systems: As per HPI otherwise all other systems reviewed and negative.    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 [facility])     Past Surgical History:  Procedure Laterality Date   PORTA CATH INSERTION N/A 10/22/2019   Procedure: PORTA CATH INSERTION;  Surgeon: Katha Cabal, MD;  Location: [facility];  Service: Cardiovascular;  Laterality: N/A;   VIDEO BRONCHOSCOPY WITH ENDOBRONCHIAL ULTRASOUND N/A 09/25/2019   Procedure: VIDEO BRONCHOSCOPY WITH ENDOBRONCHIAL ULTRASOUND;  Surgeon: Tyler Pita, MD;  Location: [facility];  Service: Pulmonary;  Laterality: N/A;     reports that he quit smoking about 28 years ago. His smoking use included cigarettes. He has a 20.00 pack-year smoking history. He has never used smokeless tobacco. He  reports that he does not currently use alcohol. He reports that he does not use drugs.  Allergies  Allergen Reactions   Taxol [Paclitaxel] Other (See Comments)    Chest pain, hip pain, coughing , wheezing    Family History  Problem Relation Age of Onset   Cancer Brother       Prior to Admission  medications   Medication Sig Start Date End Date Taking? Authorizing Provider  acetaminophen (TYLENOL) 500 MG tablet Take 1,000 mg by mouth every 6 (six) hours as needed for mild pain, moderate pain or headache. Patient not taking: No sig reported    [provider]  albuterol (VENTOLIN HFA) 108 (90 Base) MCG/ACT inhaler Inhale 2 puffs into the lungs every 4 (four) hours as needed for wheezing or shortness of breath. 11/05/20   Borders, Kirt Boys, NP  benzonatate (TESSALON PERLES) 100 MG capsule Take 1 capsule (100 mg total) by mouth every 6 (six) hours as needed for cough. Patient not taking: No sig reported 09/25/20 09/25/21  Sindy Guadeloupe, MD  Budeson-Glycopyrrol-Formoterol (BREZTRI AEROSPHERE) 160-9-4.8 MCG/ACT AERO Inhale 2 puffs into the lungs in the morning and at bedtime. 11/13/20   Martyn Ehrich, NP  clobetasol cream (TEMOVATE) 0.05 % Apply topically. 10/28/20   [provider]  famotidine (PEPCID) 20 MG tablet Take 1 tablet (20 mg total) by mouth daily. 10/31/20 10/31/21  Nance Pear, MD  HYDROcodone bit-homatropine (HYDROMET) 5-1.5 MG/5ML syrup Take 5 mLs by mouth every 6 (six) hours as needed for cough. 10/02/20   Sindy Guadeloupe, MD  hydrOXYzine (ATARAX/VISTARIL) 25 MG tablet Take 25 mg by mouth daily. 06/11/20   [provider]  lidocaine (XYLOCAINE) 2 % solution Use as directed 15 mLs in the mouth or throat every 6 (six) hours as needed (abdominal pain). 10/31/20   Nance Pear, MD  lidocaine-prilocaine (EMLA) cream Apply 1 application topically once. 12/18/19   [provider]  oxyCODONE 10 MG TABS Take 1 tablet (10 mg total) by mouth every 4 (four) hours as needed for severe pain. 11/25/20   Rudene Re, MD  pantoprazole (PROTONIX) 40 MG tablet Take 1 tablet (40mg ) twice daily for two weeks and then take 1 tablet daily thereafter 11/05/20   Borders, Kirt Boys, NP  potassium chloride SA (KLOR-CON) 20 MEQ tablet TAKE 1 TABLET(20 MEQ) BY MOUTH  TWICE DAILY 11/01/20   Sindy Guadeloupe, MD  sucralfate (CARAFATE) 1 g tablet Take 1 tablet (1 g total) by mouth 4 (four) times daily. 11/05/20   Borders, Kirt Boys, NP  traZODone (DESYREL) 50 MG tablet TAKE 1 TABLET(50 MG) BY MOUTH AT BEDTIME Patient not taking: No sig reported 04/23/20   Sindy Guadeloupe, MD  prochlorperazine (COMPAZINE) 10 MG tablet TAKE 1 TABLET(10 MG) BY MOUTH EVERY 6 HOURS AS NEEDED FOR NAUSEA OR VOMITING 10/22/19 11/25/19  Sindy Guadeloupe, MD    Physical Exam: Vitals:   11/30/20 0942 11/30/20 1055 11/30/20 1325  BP: (!) 132/91 131/81 (!) 153/95  Pulse: (!) 120 (!) 103 (!) 104  Resp: 20 (!) 26 (!) 27  Temp: 98.3 F (36.8 C)    TempSrc: Oral    SpO2: 97% 98% 97%  Weight: 77.1 kg    Height: 5\' 10"  (1.778 m)       Vitals:   11/30/20 0942 11/30/20 1055 11/30/20 1325  BP: (!) 132/91 131/81 (!) 153/95  Pulse: (!) 120 (!) 103 (!) 104  Resp: 20 (!) 26 (!) 27  Temp: 98.3 F (  36.8 C)    TempSrc: Oral    SpO2: 97% 98% 97%  Weight: 77.1 kg    Height: 5\' 10"  (1.778 m)        Constitutional: Alert and oriented x 3 .  Appears to be in severe pain.  Clutching his chest HEENT:      Head: Normocephalic and atraumatic.         Eyes: PERLA, EOMI, Conjunctivae are pallor. Sclera is non-icteric.       Mouth/Throat: Mucous membranes are moist.       Neck: Supple with no signs of meningismus. Cardiovascular: Tachycardia. No murmurs, gallops, or rubs. 2+ symmetrical distal pulses are present . No JVD. No LE edema Respiratory: Respiratory effort normal .Lungs sounds clear bilaterally. No wheezes, crackles, or rhonchi.  Gastrointestinal: Soft, non tender, urine UA, and non distended with positive bowel sounds.  Genitourinary: No CVA tenderness. Musculoskeletal: Nontender with normal range of motion in all extremities. No cyanosis, or erythema of extremities. Neurologic:  Face is symmetric. Moving all extremities. No gross focal neurologic deficits . Skin: Skin is warm, dry.  No rash  or ulcers Psychiatric: Mood and affect are normal    Labs on Admission: I have personally reviewed following labs and imaging studies  CBC: Recent Labs  Lab 11/25/20 0052 11/30/20 0940  WBC 8.4 8.8  HGB 10.8* 11.5*  HCT 33.0* 34.8*  MCV 78.4* 77.3*  PLT 593* 604*   Basic Metabolic Panel: Recent Labs  Lab 11/25/20 0052 11/30/20 0940  NA 137 137  K 3.5 3.5  CL 105 104  CO2 21* 22  GLUCOSE 132* 155*  BUN 17 17  CREATININE 0.81 0.82  CALCIUM 8.7* 9.2   GFR: Estimated Creatinine Clearance: 85.3 mL/min (by C-G formula based on SCr of 0.82 mg/dL). Liver Function Tests: Recent Labs  Lab 11/25/20 0052  AST 20  ALT 22  ALKPHOS 44  BILITOT 1.0  PROT 7.0  ALBUMIN 2.8*   Recent Labs  Lab 11/25/20 0141  LIPASE 36   No results for input(s): AMMONIA in the last 168 hours. Coagulation Profile: No results for input(s): INR, PROTIME in the last 168 hours. Cardiac Enzymes: No results for input(s): CKTOTAL, CKMB, CKMBINDEX, TROPONINI in the last 168 hours. BNP (last 3 results) No results for input(s): PROBNP in the last 8760 hours. HbA1C: No results for input(s): HGBA1C in the last 72 hours. CBG: No results for input(s): GLUCAP in the last 168 hours. Lipid Profile: No results for input(s): CHOL, HDL, LDLCALC, TRIG, CHOLHDL, LDLDIRECT in the last 72 hours. Thyroid Function Tests: No results for input(s): TSH, T4TOTAL, FREET4, T3FREE, THYROIDAB in the last 72 hours. Anemia Panel: No results for input(s): VITAMINB12, FOLATE, FERRITIN, TIBC, IRON, RETICCTPCT in the last 72 hours. Urine analysis: No results found for: COLORURINE, APPEARANCEUR, LABSPEC, [place], GLUCOSEU, HGBUR, BILIRUBINUR, KETONESUR, PROTEINUR, UROBILINOGEN, NITRITE, LEUKOCYTESUR  Radiological Exams on Admission: DG Chest 2 View  Result Date: 11/30/2020 CLINICAL DATA:  Chest pain, shortness of breath, chills EXAM: CHEST - 2 VIEW COMPARISON:  Chest radiograph and CTA chest 11/26/2010 FINDINGS: Chest  wall port is stable in position. The cardiomediastinal silhouette is stable. Patchy opacity projecting over the left mid lung is unchanged. There is no new or progressive airspace disease. There is no pleural effusion or pneumothorax. The bones are stable. IMPRESSION: Unchanged masslike opacity projecting over the left midlung. No new or progressive airspace disease Electronically Signed   By: Valetta Mole M.D.   On: 11/30/2020 10:19   CT  Angio Chest PE W and/or Wo Contrast  Result Date: 11/30/2020 CLINICAL DATA:  Chest pain, shortness of breath EXAM: CT ANGIOGRAPHY CHEST WITH CONTRAST TECHNIQUE: Multidetector CT imaging of the chest was performed using the standard protocol during bolus administration of intravenous contrast. Multiplanar CT image reconstructions and MIPs were obtained to evaluate the vascular anatomy. CONTRAST:  11mL OMNIPAQUE IOHEXOL 350 MG/ML SOLN COMPARISON:  11/25/2020 FINDINGS: Cardiovascular: Right IJ port catheter extends to right atrium. Heart size upper limits normal. Good contrast opacification of the pulmonary arterial tree. No definite acute PE, with significant image degradation through the lung bases secondary to confirm continued patient breathing motion during the study. Good contrast opacification of the thoracic aorta without dissection, aneurysm, or stenosis. Bovine variant brachiocephalic arterial origin anatomy without proximal stenosis. No significant atheromatous change. Mediastinum/Nodes: 1 cm pretracheal lymph node. Subcarinal adenopathy stable. 6.6 cm left hilar mass involving and occluding branches of the left pulmonary veins, abutting upper lower lobe bronchi, with central air bronchograms. A Lungs/Pleura: Left hilar mass extending into the inferior lingula and left lower lobe. Right lung clear. Some image degradation secondary to continued breathing during the acquisition. Upper Abdomen: Renal cysts.  No acute findings. Musculoskeletal: No chest wall abnormality.  No acute or significant osseous findings. Review of the MIP images confirms the above findings. IMPRESSION: 1. Negative for acute PE or thoracic aortic dissection. 2. Persistent left hilar masslike process involving branches of the left pulmonary veins and extending to involve the inferior lingula and left lower lobe, suspicious for neoplasm, as has been previously described 3. No evidence of distal metastatic disease. Electronically Signed   By: Lucrezia Europe M.D.   On: 11/30/2020 12:40     Assessment/Plan Principal Problem:   Chest pain Active Problems:   Malignant neoplasm of lower lobe of left lung [facility])   Radiation esophagitis      Patient is a 70 year old male with a history of adenocarcinoma of the lung who presents to the ER for evaluation of chest pain.  He has had multiple ER visits for same.     Chest pain Probably secondary to radiation esophagitis Patient has a known history of reflux disease He has ruled out for acute coronary syndrome Will place patient on IV Protonix as well as a GI cocktail IV morphine as needed Continue Carafate  Malignant neoplasm of lower lobe of left lung Follow-up with oncology as an outpatient    COPD Not acutely exacerbated Continue as needed bronchodilator therapy as well as inhaled steroids    DVT prophylaxis: Lovenox  Code Status: full code  Family Communication: Greater than 50% of time was spent discussing patient's condition and plan of care with him at the bedside.  He lists his sister as his healthcare power of attorney.  CODE STATUS was discussed and he wishes to be a full code Disposition Plan: Back to previous home environment Consults called: Oncology Status: Observation    Cassity Christian MD [facility]     11/30/2020, 2:27 PM

## 2020-11-30 NOTE — Telephone Encounter (Signed)
Patient is current in ED.  Will call patient to schedule once d/c.

## 2020-11-30 NOTE — Telephone Encounter (Signed)
PET scan has not been scheduled.  Spoke to Jet, who stated that PA is pending.  Will contact patient to schedule OV once PET is scheduled.

## 2020-11-30 NOTE — ED Provider Notes (Signed)
[facility] Emergency Department Provider Note ____________________________________________   Event Date/Time   First MD Initiated Contact with Patient 11/30/20 1036     (approximate)  I have reviewed the triage vital signs and the nursing notes.  HISTORY  Chief Complaint Chest Pain   HPI Mitchell Frye is a 70 y.o. malewho presents to the ED for evaluation of chest pain.  Chart review indicates history of lung cancer, HTN, HLD.  Stage IIIb adenocarcinoma receiving durvalumab This is his third ED visit in the past week for similar complaints.  Patient presents to the ED for evaluation of acute chest pain or shortness of breath began last night.  He reports pain that has been intermittent, and increasingly worsening, over the past 12 hours.  Reports left-sided chest discomfort that is sharp, pleuritic and associated with shortness of breath.  Denies fever, syncope, hemoptysis, falls or trauma.  Denies abdominal pain or emesis.  Reports 10/10 pain.  Nonradiating.  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   B12 deficiency 11/04/2019   Goals of care, counseling/discussion 10/04/2019   Malignant neoplasm of lower lobe of left lung ([place]) 10/04/2019    Past Surgical History:  Procedure Laterality Date   PORTA CATH INSERTION N/A 10/22/2019   Procedure: PORTA CATH INSERTION;  Surgeon: Katha Cabal, MD;  Location: [place] [facility];  Service: Cardiovascular;  Laterality: N/A;   VIDEO BRONCHOSCOPY WITH ENDOBRONCHIAL ULTRASOUND N/A 09/25/2019   Procedure: VIDEO BRONCHOSCOPY WITH ENDOBRONCHIAL ULTRASOUND;  Surgeon: Tyler Pita, MD;  Location: [facility] OR[facility];  Service: Pulmonary;  Laterality: N/A;    Prior to Admission medications    Medication Sig Start Date End Date Taking? Authorizing Provider  acetaminophen (TYLENOL) 500 MG tablet Take 1,000 mg by mouth every 6 (six) hours as needed for mild pain, moderate pain or headache. Patient not taking: No sig reported    [provider]  albuterol (VENTOLIN HFA) 108 (90 Base) MCG/ACT inhaler Inhale 2 puffs into the lungs every 4 (four) hours as needed for wheezing or shortness of breath. 11/05/20   Borders, Kirt Boys, NP  benzonatate (TESSALON PERLES) 100 MG capsule Take 1 capsule (100 mg total) by mouth every 6 (six) hours as needed for cough. Patient not taking: No sig reported 09/25/20 09/25/21  Sindy Guadeloupe, MD  Budeson-Glycopyrrol-Formoterol (BREZTRI AEROSPHERE) 160-9-4.8 MCG/ACT AERO Inhale 2 puffs into the lungs in the morning and at bedtime. 11/13/20   Martyn Ehrich, NP  clobetasol cream (TEMOVATE) 0.05 % Apply topically. 10/28/20   [provider]  famotidine (PEPCID) 20 MG tablet Take 1 tablet (20 mg total) by mouth daily. 10/31/20 10/31/21  Nance Pear, MD  HYDROcodone bit-homatropine (HYDROMET) 5-1.5 MG/5ML syrup Take 5 mLs by mouth every 6 (six) hours as needed for cough. 10/02/20   Sindy Guadeloupe, MD  hydrOXYzine (ATARAX/VISTARIL) 25 MG tablet Take 25 mg by mouth daily. 06/11/20   [provider]  lidocaine (XYLOCAINE) 2 % solution Use as directed 15 mLs in the mouth or throat every 6 (six) hours as needed (abdominal pain). 10/31/20   Nance Pear, MD  lidocaine-prilocaine (EMLA) cream Apply 1 application topically once. 12/18/19   [provider]  oxyCODONE 10 MG TABS Take  1 tablet (10 mg total) by mouth every 4 (four) hours as needed for severe pain. 11/25/20   Rudene Re, MD  pantoprazole (PROTONIX) 40 MG tablet Take 1 tablet (40mg ) twice daily for two weeks and then take 1 tablet daily thereafter 11/05/20   Borders, Kirt Boys, NP  potassium chloride SA (KLOR-CON) 20 MEQ tablet TAKE 1 TABLET(20 MEQ) BY MOUTH TWICE DAILY  11/01/20   Sindy Guadeloupe, MD  sucralfate (CARAFATE) 1 g tablet Take 1 tablet (1 g total) by mouth 4 (four) times daily. 11/05/20   Borders, Kirt Boys, NP  traZODone (DESYREL) 50 MG tablet TAKE 1 TABLET(50 MG) BY MOUTH AT BEDTIME Patient not taking: No sig reported 04/23/20   Sindy Guadeloupe, MD  prochlorperazine (COMPAZINE) 10 MG tablet TAKE 1 TABLET(10 MG) BY MOUTH EVERY 6 HOURS AS NEEDED FOR NAUSEA OR VOMITING 10/22/19 11/25/19  Sindy Guadeloupe, MD    Allergies Taxol [paclitaxel]  Family History  Problem Relation Age of Onset   Cancer Brother     Social History Social History   Tobacco Use   Smoking status: Former    Packs/day: 1.00    Years: 20.00    Pack years: 20.00    Types: Cigarettes    Quit date: 1994    Years since quitting: 28.8   Smokeless tobacco: Never  Vaping Use   Vaping Use: Never used  Substance Use Topics   Alcohol use: Not Currently    Comment: not drank any beer in 2 onths   Drug use: Never    Review of Systems  Constitutional: No fever/chills Eyes: No visual changes. ENT: No sore throat. Cardiovascular: Positive for chest pain. Respiratory: Positive for shortness of breath. Gastrointestinal: No abdominal pain.  No nausea, no vomiting.  No diarrhea.  No constipation. Genitourinary: Negative for dysuria. Musculoskeletal: Negative for back pain. Skin: Negative for rash. Neurological: Negative for headaches, focal weakness or numbness. ____________________________________________   PHYSICAL EXAM:  VITAL SIGNS: Vitals:   11/30/20 1055 11/30/20 1325  BP: 131/81 (!) 153/95  Pulse: (!) 103 (!) 104  Resp: (!) 26 (!) 27  Temp:    SpO2: 98% 97%     Constitutional: Alert and oriented.  Appears uncomfortable, obviously tachypneic and dyspneic.  Grunting with respirations..  Speaking in phrases only. Eyes: Conjunctivae are normal. PERRL. EOMI. Head: Atraumatic. Nose: No congestion/rhinnorhea. Mouth/Throat: Mucous membranes are moist.  Oropharynx  non-erythematous. Neck: No stridor. No cervical spine tenderness to palpation. Cardiovascular: Tachycardic rate, regular rhythm. Grossly normal heart sounds.  Good peripheral circulation. Respiratory: Tachypneic to about 30.  No retractions.  Faint expiratory wheezes are noted throughout. Gastrointestinal: Soft , nondistended, nontender to palpation. No CVA tenderness. Musculoskeletal: No lower extremity tenderness nor edema.  No joint effusions. No signs of acute trauma. Neurologic:  Normal speech and language. No gross focal neurologic deficits are appreciated. No gait instability noted. Skin:  Skin is warm, dry and intact. No rash noted. Psychiatric: Mood and affect are normal. Speech and behavior are normal.  ____________________________________________   LABS (all labs ordered are listed, but only abnormal results are displayed)  Labs Reviewed  BASIC METABOLIC PANEL - Abnormal; Notable for the following components:      Result Value   Glucose, Bld 155 (*)    All other components within normal limits  CBC - Abnormal; Notable for the following components:   Hemoglobin 11.5 (*)    HCT 34.8 (*)    MCV 77.3 (*)    MCH 25.6 (*)  RDW 18.8 (*)    Platelets 614 (*)    All other components within normal limits  RESP PANEL BY RT-PCR (FLU A&B, COVID) ARPGX2  TROPONIN I (HIGH SENSITIVITY)  TROPONIN I (HIGH SENSITIVITY)   ____________________________________________  12 Lead EKG  Sinus tachycardia, rate of 124 bpm.  Normal axis and intervals.  Inferior T wave inversions without acute STEMI. ____________________________________________  RADIOLOGY  ED MD interpretation: 2 view CXR with chronic mass to left midlung without evidence of acute cardiopulmonary pathology on top of this.  Official radiology report(s): DG Chest 2 View  Result Date: 11/30/2020 CLINICAL DATA:  Chest pain, shortness of breath, chills EXAM: CHEST - 2 VIEW COMPARISON:  Chest radiograph and CTA chest  11/26/2010 FINDINGS: Chest wall port is stable in position. The cardiomediastinal silhouette is stable. Patchy opacity projecting over the left mid lung is unchanged. There is no new or progressive airspace disease. There is no pleural effusion or pneumothorax. The bones are stable. IMPRESSION: Unchanged masslike opacity projecting over the left midlung. No new or progressive airspace disease Electronically Signed   By: Valetta Mole M.D.   On: 11/30/2020 10:19   CT Angio Chest PE W and/or Wo Contrast  Result Date: 11/30/2020 CLINICAL DATA:  Chest pain, shortness of breath EXAM: CT ANGIOGRAPHY CHEST WITH CONTRAST TECHNIQUE: Multidetector CT imaging of the chest was performed using the standard protocol during bolus administration of intravenous contrast. Multiplanar CT image reconstructions and MIPs were obtained to evaluate the vascular anatomy. CONTRAST:  61mL OMNIPAQUE IOHEXOL 350 MG/ML SOLN COMPARISON:  11/25/2020 FINDINGS: Cardiovascular: Right IJ port catheter extends to right atrium. Heart size upper limits normal. Good contrast opacification of the pulmonary arterial tree. No definite acute PE, with significant image degradation through the lung bases secondary to confirm continued patient breathing motion during the study. Good contrast opacification of the thoracic aorta without dissection, aneurysm, or stenosis. Bovine variant brachiocephalic arterial origin anatomy without proximal stenosis. No significant atheromatous change. Mediastinum/Nodes: 1 cm pretracheal lymph node. Subcarinal adenopathy stable. 6.6 cm left hilar mass involving and occluding branches of the left pulmonary veins, abutting upper lower lobe bronchi, with central air bronchograms. A Lungs/Pleura: Left hilar mass extending into the inferior lingula and left lower lobe. Right lung clear. Some image degradation secondary to continued breathing during the acquisition. Upper Abdomen: Renal cysts.  No acute findings. Musculoskeletal:  No chest wall abnormality. No acute or significant osseous findings. Review of the MIP images confirms the above findings. IMPRESSION: 1. Negative for acute PE or thoracic aortic dissection. 2. Persistent left hilar masslike process involving branches of the left pulmonary veins and extending to involve the inferior lingula and left lower lobe, suspicious for neoplasm, as has been previously described 3. No evidence of distal metastatic disease. Electronically Signed   By: Lucrezia Europe M.D.   On: 11/30/2020 12:40    ____________________________________________   PROCEDURES and INTERVENTIONS  Procedure(s) performed (including Critical Care):  .1-3 Lead EKG Interpretation Performed by: Vladimir Crofts, MD Authorized by: Vladimir Crofts, MD     Interpretation: abnormal     ECG rate:  110   ECG rate assessment: tachycardic     Rhythm: sinus tachycardia     Ectopy: none     Conduction: normal    Medications  famotidine (PEPCID) IVPB 20 mg premix (20 mg Intravenous New Bag/Given 11/30/20 1313)  HYDROmorphone (DILAUDID) injection 1 mg (has no administration in time range)  morphine 4 MG/ML injection 4 mg (4 mg Intravenous Given 11/30/20 1057)  alum & mag hydroxide-simeth (MAALOX/MYLANTA) 200-200-20 MG/5ML suspension 30 mL (30 mLs Oral Given 11/30/20 1143)    And  lidocaine (XYLOCAINE) 2 % viscous mouth solution 15 mL (15 mLs Oral Given 11/30/20 1143)  iohexol (OMNIPAQUE) 350 MG/ML injection 75 mL (75 mLs Intravenous Contrast Given 11/30/20 1156)  oxyCODONE (Oxy IR/ROXICODONE) immediate release tablet 10 mg (10 mg Oral Given 11/30/20 1321)  acetaminophen (TYLENOL) tablet 1,000 mg (1,000 mg Oral Given 11/30/20 1321)    ____________________________________________   MDM / ED COURSE   70 year old male presents to the ED with poorly controlled cancer-related pain requiring medical observation admission.  Persistently tachycardic and tachypneic, but no instability or hypoxia.  No evidence of ACS,  acute PE, PTX or electrolyte derangements.  Despite multiple rounds of pain medications he has poorly controlled pain.  We will discuss with medicine for admission.  Clinical Course as of 11/30/20 1333  Mon Nov 30, 2020  1138 Reassessed. [DS]  3295 reassessed [DS]  1884 Reassessed.  Patient reports continued 10/10 pain.  We discussed his reassuring work-up overall.  He remains tachycardic and tachypneic and appears quite uncomfortable.  We discussed the possibility of medical observation admission and he is agreeable.  We will discuss the case with hospitalist. [DS]    Clinical Course User Index [DS] Vladimir Crofts, MD    ____________________________________________   FINAL CLINICAL IMPRESSION(S) / ED DIAGNOSES  Final diagnoses:  Other chest pain     ED Discharge Orders     None        Jeris Roser Tamala Julian   Note:  This document was prepared using Dragon voice recognition software and may include unintentional dictation errors.    Vladimir Crofts, MD 11/30/20 [phone]

## 2020-11-30 NOTE — Telephone Encounter (Signed)
Patient had cardiology appt today. Hopefulyl he can go for that. Multiple Er visits for similar complaints. I see him in 10 days. Dr. Patsey Berthold- I would appreciate if your team can see him as well given recent ct findings which we will discuss at tumor board this week

## 2020-11-30 NOTE — ED Notes (Signed)
Pharmacy contacted for missing meds

## 2020-11-30 NOTE — ED Notes (Signed)
Pt resting, RR even and unlabored

## 2020-11-30 NOTE — Progress Notes (Signed)
PET scan approved and scheduled for Tues 12/08/20 at 130p. Pt currently in the ED. Will call pt with appt once discharges home. Dr. Patsey Berthold staff made aware to schedule further follow up with him in their clinic.

## 2020-11-30 NOTE — ED Triage Notes (Signed)
Pt comes via EMs with c/o CP and SOb. Pt walked to truck and then tried to get out of EMS truck. Pt states he was seen here for indigestion and was evaluated and then sent home.  Pt was not being very cooperative with EMs.  O2-89 placed on some O2 and at 99, pt now RA and at 93%RA, all other vitals stable.

## 2020-11-30 NOTE — Consult Note (Signed)
Hematology/Oncology Consult note [facility] [phone] [phone]  Patient Care Team: Center, [facility] as PCP - General Telford Nab, [place] as Oncology Nurse Navigator   Name of the patient: Mitchell Frye  [id number]  11-25-1950    Reason for consult: Chest pain in the setting of recurrent lung cancer   Requesting physician: Dr. Francine Graven  Date of visit: 11/30/2020    History of presenting illness- Patient is a 70 year old male with history of [name] non-small cell lung cancer favoring adenocarcinoma who is currently getting maintenance immunotherapy as an outpatient.  His last cycle of immunotherapy was given on 11/13/2020.  His scans in June 2022 showed interval response to treatment as evidenced by reduction in the size of the left lower lobe mass to 2.9 x 2.4 cm.  At that point no discrete mediastinal hilar or axillary lymph nodes were noted.  Patient has been having recurrent episodes of chest wall pain which has brought him to the ER at least 4 times over the last 6 weeks and he has had multiple CT angio chest all of them have shown no evidence of pulmonary embolism.  His CT scan on 10/20/2020 showed interval treatment response again with decreased left lower lobe prominence and radiation fibrosis in the left midlung.  However beginning 11/04/2020 patient noted to have increasing belt-like mass with architectural distortion involving the left lung which appeared concerning for locally recurrent disease.  His CT scan done today shows subcarinal adenopathy was stable but there was a left hilar  mass measuring 6.6 cm involving and occluding branches of the left pulmonary veins and abutting the left upper and lower lobe bronchi  At the time of my visit today patient appears to be in distress.  He was taking oxycodone 10 mg every 4 as needed as an outpatient but states that that was not helping him.  ECOG PS- 1  Pain scale-  7   Review of systems- Review of Systems  Constitutional:  Positive for malaise/fatigue.  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   B12 deficiency 11/04/2019   Goals of care, counseling/discussion 10/04/2019   Malignant neoplasm of lower lobe of left lung ([place]) 10/04/2019     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 [facility])      Past Surgical History:  Procedure Laterality Date   PORTA CATH INSERTION N/A 10/22/2019   Procedure: PORTA CATH INSERTION;  Surgeon: Katha Cabal, MD;  Location: [place] [facility];  Service: Cardiovascular;  Laterality: N/A;   VIDEO BRONCHOSCOPY WITH ENDOBRONCHIAL ULTRASOUND N/A 09/25/2019   Procedure: VIDEO BRONCHOSCOPY WITH ENDOBRONCHIAL ULTRASOUND;  Surgeon: Tyler Pita, MD;  Location: [facility];  Service: Pulmonary;  Laterality: N/A;    Social History   Socioeconomic History   Marital status: Single    Spouse name: Not on file   Number of children: Not on file   Years of education: Not on file   Highest education level: Not on file  Occupational History   Not on file  Tobacco Use   Smoking status: Former    Packs/day: 1.00    Years: 20.00    Pack years: 20.00    Types: Cigarettes    Quit date: 57    Years since quitting: 28.8   Smokeless tobacco: Never  Vaping Use   Vaping Use: Never used  Substance and Sexual Activity   Alcohol use: Not Currently    Comment: not drank any beer in 2 onths   Drug use: Never   Sexual activity: Not on file  Other Topics Concern   Not on file  Social History Narrative   Not on file   Social Determinants of Health    Financial Resource Strain: Not on file  Food Insecurity: Not on file  Transportation Needs: Not on file  Physical Activity: Not on file  Stress: Not on file  Social Connections: Not on file  Intimate Partner Violence: Not on file     Family History  Problem Relation Age of Onset   Cancer Brother      Current Facility-Administered Medications:    albuterol (PROVENTIL) (2.5 MG/3ML) 0.083% nebulizer solution 2.5 mg, 2.5 mg, Inhalation, Q4H PRN, Agbata, Tochukwu, MD   benzonatate (TESSALON) capsule 100 mg, 100 mg, Oral, Q6H PRN, Agbata, Tochukwu, MD   Budeson-Glycopyrrol-Formoterol 160-9-4.8 MCG/ACT AERO 2 puff, 2 puff, Inhalation, Daily, Agbata, Tochukwu, MD   enoxaparin (LOVENOX) injection 40 mg, 40 mg, Subcutaneous, Q24H, Agbata, Tochukwu, MD, 40 mg at 11/30/20 2115   magic mouthwash w/lidocaine, 10 mL, Oral, QID, Beers, Brandon D, RPH, 10 mL at 11/30/20 1727   morphine (MS CONTIN) 12 hr tablet 15 mg, 15 mg, Oral, Q12H, Sindy Guadeloupe, MD, 15 mg at 11/30/20 2114   morphine 2 MG/ML injection 2 mg, 2 mg, Intravenous, Q4H PRN, Agbata, Tochukwu, MD, 2 mg at 11/30/20 2115   ondansetron (ZOFRAN) tablet 4 mg, 4 mg, Oral, Q6H PRN **OR** ondansetron (ZOFRAN) injection 4 mg, 4 mg, Intravenous, Q6H PRN, Agbata, Tochukwu, MD   oxyCODONE (Oxy IR/ROXICODONE) immediate release tablet 20 mg, 20 mg, Oral, Q4H PRN, Sindy Guadeloupe, MD   pantoprazole (PROTONIX) injection 40 mg, 40 mg, Intravenous, Q24H, Agbata, Tochukwu, MD, 40 mg at 11/30/20 1726   sodium chloride flush (NS) 0.9 % injection 3 mL, 3 mL, Intravenous, Q12H, Agbata, Tochukwu, MD, 3 mL at 11/30/20 2115   sucralfate (CARAFATE) tablet 1 g, 1 g, Oral, QID, Agbata, Tochukwu, MD, 1 g at 11/30/20 2114   Physical exam:  Vitals:   11/30/20 1325 11/30/20 1522 11/30/20 1847 11/30/20 2018  BP: (!) 153/95 (!) 141/97 107/70 122/74  Pulse: (!) 104 (!) 111 88 84  Resp: (!) 27 (!) 26 (!) 24 (!) 22  Temp:      TempSrc:      SpO2: 97% 93% 92% 95%   Weight:      Height:       Physical Exam Constitutional:      Comments: Appears in distress due to pain  Cardiovascular:     Rate and Rhythm: Regular rhythm. Tachycardia present.     Heart sounds: Normal heart sounds.  Pulmonary:     Breath sounds: Normal breath sounds.     Comments: Effort of breathing increased Abdominal:     General: Bowel sounds are normal.     Palpations: Abdomen is soft.  Skin:    General: Skin is warm and dry.  Neurological:     Mental Status: He is alert and oriented to person, place, and time.  CMP Latest Ref Rng & Units 11/30/2020  Glucose 70 - 99 mg/dL 155(H)  BUN 8 - 23 mg/dL 17  Creatinine 0.61 - 1.24 mg/dL 0.82  Sodium 135 - 145 mmol/L 137  Potassium 3.5 - 5.1 mmol/L 3.5  Chloride 98 - 111 mmol/L 104  CO2 22 - 32 mmol/L 22  Calcium 8.9 - 10.3 mg/dL 9.2  Total Protein 6.5 - 8.1 g/dL -  Total Bilirubin 0.3 - 1.2 mg/dL -  Alkaline Phos 38 - 126 U/L -  AST 15 - 41 U/L -  ALT 0 - 44 U/L -   CBC Latest Ref Rng & Units 11/30/2020  WBC 4.0 - 10.5 K/uL 8.8  Hemoglobin 13.0 - 17.0 g/dL 11.5(L)  Hematocrit 39.0 - 52.0 % 34.8(L)  Platelets 150 - 400 K/uL 614(H)    @IMAGES @  DG Chest 2 View  Result Date: 11/30/2020 CLINICAL DATA:  Chest pain, shortness of breath, chills EXAM: CHEST - 2 VIEW COMPARISON:  Chest radiograph and CTA chest 11/26/2010 FINDINGS: Chest wall port is stable in position. The cardiomediastinal silhouette is stable. Patchy opacity projecting over the left mid lung is unchanged. There is no new or progressive airspace disease. There is no pleural effusion or pneumothorax. The bones are stable. IMPRESSION: Unchanged masslike opacity projecting over the left midlung. No new or progressive airspace disease Electronically Signed   By: Valetta Mole M.D.   On: 11/30/2020 10:19   DG Chest 2 View  Result Date: 11/25/2020 CLINICAL DATA:  Chest pain EXAM: CHEST - 2 VIEW COMPARISON:  11/13/2020 FINDINGS: Masslike consolidation  within the left lung base is again seen and appears stable since prior examination. Right lung is clear. No pneumothorax or pleural effusion. Right internal jugular chest port is seen with its tip at the superior right atrium. Cardiac size within normal limits. No acute bone abnormality. IMPRESSION: Stable masslike consolidation within the left lower lobe. Electronically Signed   By: Fidela Salisbury M.D.   On: 11/25/2020 01:29   DG Chest 2 View  Result Date: 11/13/2020 CLINICAL DATA:  A 71 year old male presents with chest pain in the setting of ongoing systemic therapy for pulmonary neoplasm. EXAM: CHEST - 2 VIEW COMPARISON:  October 30, 2020. FINDINGS: RIGHT Port-A-Cath in-situ, tip at the upper portion of the RIGHT atrium as on previous imaging. Stable LEFT perihilar masslike area with no new areas of consolidation. No visible pneumothorax. No sign of pleural effusion. On limited assessment no acute skeletal process. IMPRESSION: Stable chest x-ray. No acute changes with unchanged appearance of LEFT hilar distortion and masslike appearance. Electronically Signed   By: Zetta Bills M.D.   On: 11/13/2020 14:06   CT Angio Chest PE W and/or Wo Contrast  Result Date: 11/30/2020 CLINICAL DATA:  Chest pain, shortness of breath EXAM: CT ANGIOGRAPHY CHEST WITH CONTRAST TECHNIQUE: Multidetector CT imaging of the chest was performed using the standard protocol during bolus administration of intravenous contrast. Multiplanar CT image reconstructions and MIPs were obtained to evaluate the vascular anatomy. CONTRAST:  28mL OMNIPAQUE IOHEXOL 350 MG/ML SOLN COMPARISON:  11/25/2020 FINDINGS: Cardiovascular: Right IJ port catheter extends to right atrium. Heart size upper limits normal. Good contrast opacification of the pulmonary arterial tree. No definite acute PE, with significant image degradation through the lung bases secondary to confirm continued patient breathing motion during the study. Good contrast  opacification of the thoracic aorta without dissection, aneurysm, or stenosis. Bovine variant brachiocephalic arterial origin anatomy without proximal stenosis. No significant atheromatous change. Mediastinum/Nodes: 1 cm  pretracheal lymph node. Subcarinal adenopathy stable. 6.6 cm left hilar mass involving and occluding branches of the left pulmonary veins, abutting upper lower lobe bronchi, with central air bronchograms. A Lungs/Pleura: Left hilar mass extending into the inferior lingula and left lower lobe. Right lung clear. Some image degradation secondary to continued breathing during the acquisition. Upper Abdomen: Renal cysts.  No acute findings. Musculoskeletal: No chest wall abnormality. No acute or significant osseous findings. Review of the MIP images confirms the above findings. IMPRESSION: 1. Negative for acute PE or thoracic aortic dissection. 2. Persistent left hilar masslike process involving branches of the left pulmonary veins and extending to involve the inferior lingula and left lower lobe, suspicious for neoplasm, as has been previously described 3. No evidence of distal metastatic disease. Electronically Signed   By: Lucrezia Europe M.D.   On: 11/30/2020 12:40   CT Angio Chest PE W and/or Wo Contrast  Result Date: 11/25/2020 CLINICAL DATA:  Three-month history of chest pain. History of lung cancer. EXAM: CT ANGIOGRAPHY CHEST WITH CONTRAST TECHNIQUE: Multidetector CT imaging of the chest was performed using the standard protocol during bolus administration of intravenous contrast. Multiplanar CT image reconstructions and MIPs were obtained to evaluate the vascular anatomy. CONTRAST:  66mL OMNIPAQUE IOHEXOL 350 MG/ML SOLN COMPARISON:  11/13/2020 FINDINGS: Cardiovascular: The heart is mildly enlarged but stable. No pericardial effusion. Mild tortuosity of the thoracic aorta but no aneurysm or dissection. Minimal atherosclerotic calcification at the aortic arch. The branch vessels are normal. No  obvious coronary artery calcifications. The pulmonary arterial tree is fairly well opacified. No findings suspicious for pulmonary embolism. Extensive tumor surrounding the left pulmonary arteries. Mediastinum/Nodes: Persistent ill-defined soft tissue density involving the left hilar region and subcarinal space likely infiltrating tumor. Small stable right-sided hilar lymph nodes. Lungs/Pleura: Persistent and slightly progressive ill-defined soft tissue density involving the left hilum and left paramediastinal lung surrounding the left mainstem bronchus and left pulmonary artery consistent with recurrent tumor. There is also surrounding interstitial thickening and vague nodularity the left upper lobe and left lower lobe which could be obstructive pneumonitis or interstitial spread of tumor. The right lung remains relatively clear except for streaky right basilar atelectasis. No pulmonary lesions. No pleural effusions. Upper Abdomen: No significant upper abdominal findings. No hepatic or adrenal gland lesions. No upper abdominal adenopathy. Bilateral renal cysts are noted. Musculoskeletal: No significant bony findings. Review of the MIP images confirms the above findings. IMPRESSION: 1. No CT findings for pulmonary embolism. 2. Persistent and slightly progressive ill-defined soft tissue density involving the left hilum and left paramediastinal lung most consistent with recurrent tumor. 3. Surrounding interstitial thickening and vague nodularity in the left upper lobe and left lower lobe could be obstructive pneumonitis or interstitial spread of tumor. 4. Left hilar and subcarinal adenopathy. 5. No findings for upper abdominal metastatic disease. Electronically Signed   By: Marijo Sanes M.D.   On: 11/25/2020 05:26   CT Angio Chest PE W and/or Wo Contrast  Result Date: 11/13/2020 CLINICAL DATA:  70 year old male with history of chest pain and shortness of breath. History of lung cancer status post chemotherapy and  radiation therapy (complete 10 months ago). Recent diagnosis of COVID infection 10/20/2020. EXAM: CT ANGIOGRAPHY CHEST WITH CONTRAST TECHNIQUE: Multidetector CT imaging of the chest was performed using the standard protocol during bolus administration of intravenous contrast. Multiplanar CT image reconstructions and MIPs were obtained to evaluate the vascular anatomy. CONTRAST:  71mL OMNIPAQUE IOHEXOL 350 MG/ML SOLN COMPARISON:  Multiple priors,  most recently chest CTA 11/04/2020. FINDINGS: Cardiovascular: No filling defects within the pulmonary arterial tree to suggest pulmonary embolism. Heart size is normal. There is no significant pericardial fluid, thickening or pericardial calcification. Aortic atherosclerosis. No definite coronary artery calcifications. Right single-lumen power porta cath with tip terminating in the right atrium. Mediastinum/Nodes: No pathologically enlarged mediastinal or right hilar lymph nodes. Prominent soft tissue in the left hilar region, similar to prior studies. Esophagus is unremarkable in appearance. No axillary lymphadenopathy. Lungs/Pleura: Increasingly bulky mass-like area of architectural distortion in the left lung, most evident in the central aspect of the left upper lobe which has clearly progressed when compared to prior examinations dating back to 07/17/2020, with increasingly convex margins, concerning for locally recurrent disease. This is contiguous with bulky soft tissue in the left hilar region. Trace left pleural effusion lying dependently. Right lung is clear. No right pleural effusion. Upper Abdomen: 1.1 cm low-attenuation lesion in the upper pole the left kidney is compatible with a simple cyst. Musculoskeletal: There are no aggressive appearing lytic or blastic lesions noted in the visualized portions of the skeleton. Review of the MIP images confirms the above findings. IMPRESSION: 1. No evidence of pulmonary embolism. 2. Increasingly bulky mass-like  architectural distortion throughout the left lung, most evident in the central aspect of the left upper lobe where there are increasingly convex margins, concerning for locally recurrent disease. Further evaluation with PET-CT is recommended in the near future to better evaluate these findings. 3. Trace left pleural effusion lying dependently. Electronically Signed   By: Vinnie Langton M.D.   On: 11/13/2020 15:56   CT Angio Chest PE W and/or Wo Contrast  Result Date: 11/04/2020 CLINICAL DATA:  Acute shortness of breath, dyspnea, recently COVID positive 10/20/2020 EXAM: CT ANGIOGRAPHY CHEST WITH CONTRAST TECHNIQUE: Multidetector CT imaging of the chest was performed using the standard protocol during bolus administration of intravenous contrast. Multiplanar CT image reconstructions and MIPs were obtained to evaluate the vascular anatomy. CONTRAST:  67mL OMNIPAQUE IOHEXOL 350 MG/ML SOLN COMPARISON:  10/20/2020 FINDINGS: Cardiovascular: No significant central or proximal hilar pulmonary artery filling defect or acute pulmonary embolus. Limited assessment of the smaller peripheral segmental branches particularly in the lower lobes with respiratory motion artifact. No significant interval change compared to 10/20/2020. Minor aortic atherosclerosis. Negative for aneurysm or dissection. No mediastinal hemorrhage or hematoma. Patent 2 vessel arch anatomy. Central venous structures appear patent.  No veno-occlusive process. Right IJ power port catheter noted. Mild cardiac enlargement. No pericardial effusion. Mediastinum/Nodes: Trachea and central airways are patent. Slight air distention of the esophagus, nonspecific. No hiatal hernia. No bulky adenopathy. Lungs/Pleura: Stable left hilar/bronchovascular volume loss and consolidation with similar areas of peripheral left upper lobe, lingula and right lower lobe bronchovascular opacities. Findings are favored to be post treatment changes. No new acute airspace process,  edema, or CHF. No pleural abnormality, effusion or pneumothorax. Upper Abdomen: No acute upper abdominal abnormality. Stable hypodense well-circumscribed pancreas cystic lesion in the pancreas body measuring 14 mm. Please refer to the prior abdominal CTs. No associated ductal dilatation or surrounding inflammatory process. Stable left upper pole hypodense renal cyst. Musculoskeletal: Degenerative changes noted of the spine. No chest wall soft tissue abnormality or asymmetry. Intact thoracic spine. No compression fracture. Sternum intact. Review of the MIP images confirms the above findings. IMPRESSION: No significant acute central or proximal hilar pulmonary embolus by CTA. Limited assessment of the peripheral segmental branches because contrast opacification and motion artifact. No significant interval change compared to  10/20/2020. Stable left lung perihilar consolidation and bandlike areas of scarring compatible with post treatment changes. No superimposed acute airspace process or new consolidation. Stable cardiomegaly without CHF Aortic Atherosclerosis (ICD10-I70.0). Electronically Signed   By: Jerilynn Mages.  Shick M.D.   On: 11/04/2020 11:01   ECHOCARDIOGRAM COMPLETE  Result Date: 11/11/2020    ECHOCARDIOGRAM REPORT   Patient Name:   PHILLIPE CLEMON Date of Exam: 11/11/2020 Medical Rec #:  [id number]     Height:       70.0 in Accession #:    [id number]    Weight:       174.0 lb Date of Birth:  [birth year]      BSA:          1.967 m Patient Age:    28 years      BP:           130/70 mmHg Patient Gender: M             HR:           104 bpm. Exam Location:  [facility] Procedure: 2D Echo, Cardiac Doppler, Color Doppler and Strain Analysis Indications:     Dyspnea R06.00  History:         Patient has prior history of Echocardiogram examinations, most                  recent 01/13/2020. Signs/Symptoms:Dyspnea; Risk                  Factors:Hypertension.  Sonographer:     Sherrie Sport Referring Phys:  [id number] [facility] C Aeralyn Barna Diagnosing  Phys: Kate Sable MD IMPRESSIONS  1. Left ventricular ejection fraction, by estimation, is 55 to 60%. The left ventricle has normal function. The left ventricle has no regional wall motion abnormalities. Left ventricular diastolic parameters are consistent with Grade I diastolic dysfunction (impaired relaxation).  2. Right ventricular systolic function is normal. The right ventricular size is normal.  3. The mitral valve is normal in structure. No evidence of mitral valve regurgitation.  4. The aortic valve is tricuspid. Aortic valve regurgitation is mild. Mild aortic valve sclerosis is present, with no evidence of aortic valve stenosis. FINDINGS  Left Ventricle: Left ventricular ejection fraction, by estimation, is 55 to 60%. The left ventricle has normal function. The left ventricle has no regional wall motion abnormalities. Global longitudinal strain performed but not reported based on interpreter judgement due to suboptimal tracking. 3D left ventricular ejection fraction analysis performed but not reported based on interpreter judgement due to suboptimal quality. The left ventricular internal cavity size was normal in size. There is no left ventricular hypertrophy. Left ventricular diastolic parameters are consistent with Grade I diastolic dysfunction (impaired relaxation). Right Ventricle: The right ventricular size is normal. No increase in right ventricular wall thickness. Right ventricular systolic function is normal. Left Atrium: Left atrial size was normal in size. Right Atrium: Right atrial size was normal in size. Pericardium: There is no evidence of pericardial effusion. Mitral Valve: The mitral valve is normal in structure. No evidence of mitral valve regurgitation. Tricuspid Valve: The tricuspid valve is normal in structure. Tricuspid valve regurgitation is not demonstrated. Aortic Valve: The aortic valve is tricuspid. Aortic valve regurgitation is mild. Mild aortic valve sclerosis is present,  with no evidence of aortic valve stenosis. Aortic valve mean gradient measures 3.0 mmHg. Aortic valve peak gradient measures 5.1 mmHg. Aortic valve area, by VTI measures 3.44 cm. Pulmonic Valve: The pulmonic valve was normal in structure.  Pulmonic valve regurgitation is not visualized. Aorta: The aortic root is normal in size and structure. Venous: The inferior vena cava was not well visualized. IAS/Shunts: No atrial level shunt detected by color flow Doppler.  LEFT VENTRICLE PLAX 2D LVIDd:         3.80 cm  Diastology LVIDs:         2.40 cm  LV e' medial:    5.00 cm/s LV PW:         1.00 cm  LV E/e' medial:  10.9 LV IVS:        1.10 cm  LV e' lateral:   5.00 cm/s LVOT diam:     2.10 cm  LV E/e' lateral: 10.9 LV SV:         46 LV SV Index:   23 LVOT Area:     3.46 cm                          3D Volume EF:                         3D EF:        52 %                         LV EDV:       202 ml                         LV ESV:       98 ml                         LV SV:        104 ml RIGHT VENTRICLE RV Basal diam:  2.30 cm RV S prime:     15.70 cm/s TAPSE (M-mode): 4.1 cm LEFT ATRIUM             Index       RIGHT ATRIUM           Index LA diam:        3.40 cm 1.73 cm/m  RA Area:     15.70 cm LA Vol (A2C):   42.7 ml 21.70 ml/m RA Volume:   34.90 ml  17.74 ml/m LA Vol (A4C):   23.7 ml 12.05 ml/m LA Biplane Vol: 32.4 ml 16.47 ml/m  AORTIC VALVE                   PULMONIC VALVE AV Area (Vmax):    2.76 cm    PV Vmax:        0.59 m/s AV Area (Vmean):   2.59 cm    PV Peak grad:   1.4 mmHg AV Area (VTI):     3.44 cm    RVOT Peak grad: 3 mmHg AV Vmax:           113.00 cm/s AV Vmean:          78.600 cm/s AV VTI:            0.134 m AV Peak Grad:      5.1 mmHg AV Mean Grad:      3.0 mmHg LVOT Vmax:         90.10 cm/s LVOT Vmean:        58.800 cm/s LVOT VTI:  0.133 m LVOT/AV VTI ratio: 0.99  AORTA Ao Root diam: 3.60 cm MITRAL VALVE                TRICUSPID VALVE MV Area (PHT): 3.91 cm     TR Peak grad:   14.6 mmHg  MV Decel Time: 194 msec     TR Vmax:        191.00 cm/s MV E velocity: 54.40 cm/s MV A velocity: 101.00 cm/s  SHUNTS MV E/A ratio:  0.54         Systemic VTI:  0.13 m                             Systemic Diam: 2.10 cm Kate Sable MD Electronically signed by Kate Sable MD Signature Date/Time: 11/11/2020/5:54:58 PM    Final     Assessment and plan- Patient is a 70 y.o. male with history of [name] non-small cell lung cancer favoring adenocarcinoma on maintenance immunotherapy presenting with recurrent episodes of chest pain  Chest pain: Patient has been in and out of the ER at least 4 times over the last 6 weeks with similar episodes of chest pain.  He had treatment response on his scan in June 2022 which was also noted in early September 2022.  However following that CT scans have showed increased hilar fullness and a recurrent 6 cm hilar mass involving and occluding branches of the left pulmonary veins which is concerning for recurrent disease.  I have discussed this case with radiation oncology as well as pulmonary as an outpatient.  Plan was to obtain a PET CT scan with consideration for repeat bronchoscopy.  His PET scan is scheduled for 12/08/2020.  I did get in touch with Dr. Baruch Gouty this evening and I have asked him to evaluate the patient tomorrow for consideration for empiric radiation treatment.  I am concerned that the cause of his chest pain is because of his recurrent malignancy and chest pressure from the hilar mass.  He has had a cardiology work-up in the past which has been negative.CTA has not shown any evidence of pulmonary embolism.  He was supposed to see cardiology as an outpatient today but landed up getting admitted.  I will switch him from immunotherapy to systemic chemotherapy and likely rechallenge him with carboplatin and Alimta as an outpatient.  For now as an inpatient plan is for pain control and I have started him on long-acting morphine 15 mg twice daily.  I  have also restarted his outpatient oxycodone but have increased the dose to 20 mg every 4 hours as needed.  He is also on as needed IV morphine which can be used as a backup if oral oxycodone does not work for him.      Visit Diagnosis 1. Other chest pain   2. Neoplasm related pain     Dr. Randa Evens, MD, MPH [facility] [phone] 11/30/2020

## 2020-11-30 NOTE — Telephone Encounter (Signed)
PET has not been scheduled yet.  Per [place], PA is pending.

## 2020-12-01 ENCOUNTER — Ambulatory Visit: Payer: Medicare Other

## 2020-12-01 ENCOUNTER — Encounter: Payer: Self-pay | Admitting: Oncology

## 2020-12-01 ENCOUNTER — Encounter
Admission: EM | Disposition: A | Discharge status: Home Health | Payer: Self-pay | Source: Home / Self Care | Attending: Internal Medicine

## 2020-12-01 ENCOUNTER — Inpatient Hospital Stay: Payer: Medicaid Other | Admitting: Anesthesiology

## 2020-12-01 ENCOUNTER — Ambulatory Visit: Payer: Medicaid Other | Admitting: Radiation Oncology

## 2020-12-01 ENCOUNTER — Encounter: Payer: Self-pay | Admitting: Internal Medicine

## 2020-12-01 DIAGNOSIS — R54 Age-related physical debility: Secondary | ICD-10-CM | POA: Diagnosis present

## 2020-12-01 DIAGNOSIS — Z87891 Personal history of nicotine dependence: Secondary | ICD-10-CM | POA: Diagnosis not present

## 2020-12-01 DIAGNOSIS — E785 Hyperlipidemia, unspecified: Secondary | ICD-10-CM | POA: Diagnosis present

## 2020-12-01 DIAGNOSIS — Z515 Encounter for palliative care: Secondary | ICD-10-CM | POA: Diagnosis not present

## 2020-12-01 DIAGNOSIS — C3432 Malignant neoplasm of lower lobe, left bronchus or lung: Secondary | ICD-10-CM | POA: Diagnosis present

## 2020-12-01 DIAGNOSIS — Y842 Radiological procedure and radiotherapy as the cause of abnormal reaction of the patient, or of later complication, without mention of misadventure at the time of the procedure: Secondary | ICD-10-CM | POA: Diagnosis present

## 2020-12-01 DIAGNOSIS — K5903 Drug induced constipation: Secondary | ICD-10-CM | POA: Diagnosis not present

## 2020-12-01 DIAGNOSIS — R0789 Other chest pain: Secondary | ICD-10-CM | POA: Diagnosis not present

## 2020-12-01 DIAGNOSIS — I10 Essential (primary) hypertension: Secondary | ICD-10-CM | POA: Diagnosis present

## 2020-12-01 DIAGNOSIS — Z79899 Other long term (current) drug therapy: Secondary | ICD-10-CM | POA: Diagnosis not present

## 2020-12-01 DIAGNOSIS — R079 Chest pain, unspecified: Secondary | ICD-10-CM | POA: Diagnosis present

## 2020-12-01 DIAGNOSIS — T402X5A Adverse effect of other opioids, initial encounter: Secondary | ICD-10-CM | POA: Diagnosis not present

## 2020-12-01 DIAGNOSIS — J449 Chronic obstructive pulmonary disease, unspecified: Secondary | ICD-10-CM | POA: Diagnosis present

## 2020-12-01 DIAGNOSIS — I493 Ventricular premature depolarization: Secondary | ICD-10-CM | POA: Diagnosis present

## 2020-12-01 DIAGNOSIS — Z7951 Long term (current) use of inhaled steroids: Secondary | ICD-10-CM | POA: Diagnosis not present

## 2020-12-01 DIAGNOSIS — R1310 Dysphagia, unspecified: Secondary | ICD-10-CM | POA: Diagnosis not present

## 2020-12-01 DIAGNOSIS — Z809 Family history of malignant neoplasm, unspecified: Secondary | ICD-10-CM | POA: Diagnosis not present

## 2020-12-01 DIAGNOSIS — Z20822 Contact with and (suspected) exposure to covid-19: Secondary | ICD-10-CM | POA: Diagnosis present

## 2020-12-01 DIAGNOSIS — G893 Neoplasm related pain (acute) (chronic): Secondary | ICD-10-CM | POA: Diagnosis present

## 2020-12-01 DIAGNOSIS — K5520 Angiodysplasia of colon without hemorrhage: Secondary | ICD-10-CM | POA: Diagnosis not present

## 2020-12-01 DIAGNOSIS — D75838 Other thrombocytosis: Secondary | ICD-10-CM | POA: Diagnosis present

## 2020-12-01 DIAGNOSIS — K208 Other esophagitis without bleeding: Secondary | ICD-10-CM | POA: Diagnosis present

## 2020-12-01 DIAGNOSIS — K5521 Angiodysplasia of colon with hemorrhage: Secondary | ICD-10-CM | POA: Diagnosis present

## 2020-12-01 DIAGNOSIS — Z888 Allergy status to other drugs, medicaments and biological substances status: Secondary | ICD-10-CM | POA: Diagnosis not present

## 2020-12-01 DIAGNOSIS — Z9221 Personal history of antineoplastic chemotherapy: Secondary | ICD-10-CM | POA: Diagnosis not present

## 2020-12-01 DIAGNOSIS — Z923 Personal history of irradiation: Secondary | ICD-10-CM | POA: Diagnosis not present

## 2020-12-01 HISTORY — PX: ESOPHAGOGASTRODUODENOSCOPY (EGD) WITH PROPOFOL: SHX5813

## 2020-12-01 LAB — BASIC METABOLIC PANEL
Anion gap: 7 (ref 5–15)
BUN: 12 mg/dL (ref 8–23)
CO2: 25 mmol/L (ref 22–32)
Calcium: 8.8 mg/dL — ABNORMAL LOW (ref 8.9–10.3)
Chloride: 103 mmol/L (ref 98–111)
Creatinine, Ser: 0.63 mg/dL (ref 0.61–1.24)
GFR, Estimated: >60 mL/min (ref >60)
Glucose, Bld: 117 mg/dL — ABNORMAL HIGH (ref 70–99)
Potassium: 3.5 mmol/L (ref 3.5–5.1)
Sodium: 135 mmol/L (ref 135–145)

## 2020-12-01 LAB — CBC
HCT: 30 % — ABNORMAL LOW (ref 39.0–52.0)
Hemoglobin: 10 g/dL — ABNORMAL LOW (ref 13.0–17.0)
MCH: 25.6 pg — ABNORMAL LOW (ref 26.0–34.0)
MCHC: 33.3 g/dL (ref 30.0–36.0)
MCV: 76.7 fL — ABNORMAL LOW (ref 80.0–100.0)
Platelets: 496 10*3/uL — ABNORMAL HIGH (ref 150–400)
RBC: 3.91 MIL/uL — ABNORMAL LOW (ref 4.22–5.81)
RDW: 18.8 % — ABNORMAL HIGH (ref 11.5–15.5)
WBC: 6.8 10*3/uL (ref 4.0–10.5)
nRBC: 0 % (ref 0.0–0.2)

## 2020-12-01 SURGERY — ESOPHAGOGASTRODUODENOSCOPY (EGD) WITH PROPOFOL
Anesthesia: General

## 2020-12-01 MED ORDER — MAGIC MOUTHWASH W/LIDOCAINE
5.0000 mL | Freq: Three times a day (TID) | ORAL | Status: DC | PRN
  Filled 2020-12-01: qty 5

## 2020-12-01 MED ORDER — PROPOFOL 10 MG/ML IV BOLUS
INTRAVENOUS | Status: DC | PRN
  Administered 2020-12-01: 70 mg via INTRAVENOUS
  Administered 2020-12-01: 10 mg via INTRAVENOUS

## 2020-12-01 MED ORDER — PANTOPRAZOLE SODIUM 40 MG PO TBEC
40.0000 mg | DELAYED_RELEASE_TABLET | Freq: Two times a day (BID) | ORAL | Status: DC
  Administered 2020-12-01 – 2020-12-05 (×8): 40 mg via ORAL
  Filled 2020-12-01 (×8): qty 1

## 2020-12-01 MED ORDER — ALUM & MAG HYDROXIDE-SIMETH 200-200-20 MG/5ML PO SUSP
15.0000 mL | ORAL | Status: DC | PRN
  Administered 2020-12-01 – 2020-12-02 (×2): 15 mL via ORAL
  Filled 2020-12-01 (×2): qty 30

## 2020-12-01 MED ORDER — UMECLIDINIUM BROMIDE 62.5 MCG/INH IN AEPB
1.0000 | INHALATION_SPRAY | Freq: Every day | RESPIRATORY_TRACT | Status: DC
  Administered 2020-12-01 – 2020-12-05 (×5): 1 via RESPIRATORY_TRACT
  Filled 2020-12-01: qty 7

## 2020-12-01 MED ORDER — PROPOFOL 500 MG/50ML IV EMUL
INTRAVENOUS | Status: AC
  Filled 2020-12-01: qty 50

## 2020-12-01 MED ORDER — FAMOTIDINE 20 MG PO TABS: 40.0000 mg | ORAL_TABLET | Freq: Every day | ORAL | Status: DC

## 2020-12-01 MED ORDER — SODIUM CHLORIDE 0.9 % IV SOLN: INTRAVENOUS | Status: DC

## 2020-12-01 MED ORDER — PHENYLEPHRINE HCL (PRESSORS) 10 MG/ML IV SOLN
INTRAVENOUS | Status: DC | PRN
  Administered 2020-12-01: 80 ug via INTRAVENOUS

## 2020-12-01 MED ORDER — SODIUM CHLORIDE 0.9 % IV SOLN
INTRAVENOUS | Status: DC
  Administered 2020-12-01: 1000 mL via INTRAVENOUS

## 2020-12-01 MED ORDER — PROPOFOL 500 MG/50ML IV EMUL
INTRAVENOUS | Status: DC | PRN
  Administered 2020-12-01: 150 ug/kg/min via INTRAVENOUS

## 2020-12-01 MED ORDER — FLUTICASONE FUROATE-VILANTEROL 200-25 MCG/INH IN AEPB
1.0000 | INHALATION_SPRAY | Freq: Every day | RESPIRATORY_TRACT | Status: DC
  Administered 2020-12-01 – 2020-12-05 (×5): 1 via RESPIRATORY_TRACT
  Filled 2020-12-01: qty 28

## 2020-12-01 MED ORDER — LIDOCAINE HCL (CARDIAC) PF 100 MG/5ML IV SOSY
PREFILLED_SYRINGE | INTRAVENOUS | Status: DC | PRN
  Administered 2020-12-01: 40 mg via INTRAVENOUS

## 2020-12-01 MED ORDER — SENNA 8.6 MG PO TABS
1.0000 | ORAL_TABLET | Freq: Every day | ORAL | Status: DC
  Administered 2020-12-01 – 2020-12-02 (×2): 8.6 mg via ORAL
  Filled 2020-12-01 (×2): qty 1

## 2020-12-01 MED ORDER — POLYETHYLENE GLYCOL 3350 17 G PO PACK
17.0000 g | PACK | Freq: Every day | ORAL | Status: DC
  Administered 2020-12-01 – 2020-12-04 (×4): 17 g via ORAL
  Filled 2020-12-01 (×4): qty 1

## 2020-12-01 NOTE — Transfer of Care (Signed)
Immediate Anesthesia Transfer of Care Note  Patient: Vearl Allbaugh  Procedure(s) Performed: Procedure(s): ESOPHAGOGASTRODUODENOSCOPY (EGD) WITH PROPOFOL (N/A)  Patient Location: PACU and Endoscopy Unit  Anesthesia Type:General  Level of Consciousness: sedated  Airway & Oxygen Therapy: Patient Spontanous Breathing and Patient connected to nasal cannula oxygen  Post-op Assessment: Report given to RN and Post -op Vital signs reviewed and stable  Post vital signs: Reviewed and stable  Last Vitals:  Vitals:   12/01/20 1244 12/01/20 1400  BP: (!) 144/94 121/75  Pulse: (!) 103 96  Resp:  (!) 48  Temp: 37 C 36.8 C  SpO2: 76% 15%    Complications: No apparent anesthesia complications

## 2020-12-01 NOTE — Op Note (Signed)
[facility] Gastroenterology Patient Name: Mitchell Frye Procedure Date: 12/01/2020 1:03 PM MRN: [id number] Account #: [id number] Date of Birth: [birth year] Admit Type: Inpatient Age: 70 Room: [facility] ENDO ROOM 3 Gender: Male Note Status: Finalized Instrument Name: Upper Endoscope [id number] Procedure:             Upper GI endoscopy Indications:           Heartburn, Unexplained chest pain, Chest pain (non                         cardiac) Providers:             Lin Landsman MD, MD Referring MD:          [facility] (Referring MD) Medicines:             General Anesthesia Complications:         No immediate complications. Estimated blood loss: None. Procedure:             Pre-Anesthesia Assessment:                        - Prior to the procedure, a History and Physical was                         performed, and patient medications and allergies were                         reviewed. The patient is competent. The risks and                         benefits of the procedure and the sedation options and                         risks were discussed with the patient. All questions                         were answered and informed consent was obtained.                         Patient identification and proposed procedure were                         verified by the physician, the nurse, the                         anesthesiologist, the anesthetist and the technician                         in the pre-procedure area in the procedure room in the                         endoscopy suite. Mental Status Examination: alert and                         oriented. Airway Examination: normal oropharyngeal                         airway and neck mobility. Respiratory Examination:  clear to auscultation. CV Examination: normal.                         Prophylactic Antibiotics: The patient does not require                         prophylactic  antibiotics. Prior Anticoagulants: The                         patient has taken no previous anticoagulant or                         antiplatelet agents. ASA Grade Assessment: III - A                         patient with severe systemic disease. After reviewing                         the risks and benefits, the patient was deemed in                         satisfactory condition to undergo the procedure. The                         anesthesia plan was to use general anesthesia.                         Immediately prior to administration of medications,                         the patient was re-assessed for adequacy to receive                         sedatives. The heart rate, respiratory rate, oxygen                         saturations, blood pressure, adequacy of pulmonary                         ventilation, and response to care were monitored                         throughout the procedure. The physical status of the                         patient was re-assessed after the procedure.                        After obtaining informed consent, the endoscope was                         passed under direct vision. Throughout the procedure,                         the patient's blood pressure, pulse, and oxygen                         saturations were monitored continuously. The Endoscope  was introduced through the mouth, and advanced to the                         second part of duodenum. The upper GI endoscopy was                         accomplished without difficulty. The patient tolerated                         the procedure well. 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 Coagulation for hemostasis using argon plasma was       successful. Estimated blood loss: none.      The entire examined stomach was normal.      The cardia and gastric fundus were normal on retroflexion.      The  gastroesophageal junction and examined esophagus were normal. Impression:            - Two non-bleeding angioectasias in the duodenum.                         Treated with argon plasma coagulation (APC).                        - Normal stomach.                        - Normal gastroesophageal junction and esophagus.                        - No specimens collected. Recommendation:        - Return patient to hospital ward for ongoing care.                        - Resume regular diet today.                        - Continue present medications.                        - Use Protonix (pantoprazole) 40 mg PO BID long term                         for symptomatic acid reflux.                        - Follow an antireflux regimen. Procedure Code(s):     --- Professional ---                        [phone], Esophagogastroduodenoscopy, flexible,                         transoral; with control of bleeding, any method Diagnosis Code(s):     --- Professional ---                        I20.355, Angiodysplasia of stomach and duodenum  without bleeding                        R12, Heartburn                        R07.9, Chest pain, unspecified                        R07.89, Other chest pain CPT copyright 2019 American Medical Association. All rights reserved. The codes documented in this report are preliminary and upon coder review may  be revised to meet current compliance requirements. Dr. Ulyess Mort Lin Landsman MD, MD 12/01/2020 1:55:30 PM This report has been signed electronically. Number of Addenda: 0 Note Initiated On: 12/01/2020 1:03 PM Estimated Blood Loss:  Estimated blood loss: none.      [facility]

## 2020-12-01 NOTE — Anesthesia Postprocedure Evaluation (Signed)
Anesthesia Post Note  Patient: Mitchell Frye  Procedure(s) Performed: ESOPHAGOGASTRODUODENOSCOPY (EGD) WITH PROPOFOL  Patient location during evaluation: Phase II Anesthesia Type: General Level of consciousness: awake and alert, awake and oriented Pain management: pain level controlled Vital Signs Assessment: post-procedure vital signs reviewed and stable Respiratory status: spontaneous breathing, nonlabored ventilation and respiratory function stable Cardiovascular status: blood pressure returned to baseline and stable Postop Assessment: no apparent nausea or vomiting Anesthetic complications: no   No notable events documented.   Last Vitals:  Vitals:   12/01/20 1410 12/01/20 1420  BP: 104/75 114/80  Pulse: 91 89  Resp: (!) 29 (!) 23  Temp:    SpO2: 95% 95%    Last Pain:  Vitals:   12/01/20 1420  TempSrc:   PainSc: 0-No pain                 Phill Mutter

## 2020-12-01 NOTE — Anesthesia Preprocedure Evaluation (Signed)
Anesthesia Evaluation  Patient identified by MRN, date of birth, ID band Patient awake    Reviewed: Allergy & Precautions, NPO status , Patient's Chart, lab work & pertinent test results  History of Anesthesia Complications Negative for: history of anesthetic complications  Airway Mallampati: III  TM Distance: >3 FB Neck ROM: Full    Dental  (+) Missing, Poor Dentition   Pulmonary shortness of breath and at rest, neg sleep apnea, neg COPD, former smoker,     + decreased breath sounds      Cardiovascular hypertension, Pt. on medications (-) Past MI and (-) CHF Normal cardiovascular exam(-) dysrhythmias (-) Valvular Problems/Murmurs     Neuro/Psych neg Seizures    GI/Hepatic Neg liver ROS, GERD  Medicated,  Endo/Other  neg diabetes  Renal/GU negative Renal ROS     Musculoskeletal   Abdominal   Peds  Hematology   Anesthesia Other Findings . Dyspnea  . Hyperlipidemia  . Hypertension  . 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 Left ventricular ejection fraction, by estimation, is 55 to 60%   Reproductive/Obstetrics                             Anesthesia Physical  Anesthesia Plan  ASA: 4  Anesthesia Plan: General   Post-op Pain Management:    Induction: Intravenous  PONV Risk Score and Plan: 2 and Propofol infusion and TIVA  Airway Management Planned: Natural Airway and Nasal Cannula  Additional Equipment:   Intra-op Plan:   Post-operative Plan:   Informed Consent: I have reviewed the patients History and Physical, chart, labs and discussed the procedure including the risks, benefits and alternatives for the proposed anesthesia with the patient or authorized representative who has indicated his/her understanding and acceptance.       Plan Discussed with: CRNA, Anesthesiologist and  Surgeon  Anesthesia Plan Comments:         Anesthesia Quick Evaluation

## 2020-12-01 NOTE — Progress Notes (Signed)
PROGRESS NOTE    Geran Haithcock  HDQ:[id number] DOB: [birth year] DOA: 11/30/2020 PCP: [facility] [place]  Outpatient Specialists: oncology    Brief Narrative:   From admission hpi [place] Dani is a 70 y.o. male with medical history significant for hypertension, dyslipidemia, history of adenocarcinoma of the lung status post concurrent chemoradiation therapy and currently on MAB which he receives every 4 weeks, history of radiation esophagitis and hypertension. Patient presents to the emergency room and has been seen 4 times in the ER in the past 2 weeks for evaluation of chest pain which is mostly midsternal, described as a burning sensation and he thinks it is related to his history of GERD.  He states that his oncologist told him his pain was due to reflux and started him on some medicine which he said helped his symptoms but now he ran out of his PPI. He rates his pain a 10 x 10 in intensity at its worst, pain is non radiating and is associated with emesis and nausea. He received IV Pepcid and GI cocktail in the ER as well as IV morphine and Dilaudid without any significant improvement in his symptoms. He denies having any fever, no chills, no headache, no shortness of breath, no dizziness, no lightheadedness, no urinary symptoms, no changes in his bowel habits, no leg swelling, no focal deficit or blurred vision.   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 CTA neg for PE. Troponins neg. No signs chf. - bid ppi, add magic mouthwash w/ lidocaine - GI to see - radiation oncology to see; oncology is following - ms contin, oxycodone, prn morphine - miralax/senna  # Lung adenocarcinoma - rad onc to see today - oncology following, plan for outpatient chemo.  Currently on maintenance immunotherapy  # Chronic dyspnea Stable, followed by pul - home inhalers  DVT prophylaxis: lovenox Code Status: full Family Communication: none @ bedside  Level of care: Med-Surg Status is: Observation, will message UR         Consultants:  GI, oncology  Procedures: none  Antimicrobials:  none    Subjective: Constant substernal pain  Objective: Vitals:   11/30/20 1847 11/30/20 2018 12/01/20 0053 12/01/20 0436  BP: 107/70 122/74 (!) 143/77 (!) 142/84  Pulse: 88 84 94 99  Resp: (!) 24 (!) 22 20 18   Temp:   98.5 F (36.9 C) (!) 97.3 F (36.3 C)  TempSrc:   Oral Oral  SpO2: 92% 95% 95% 94%  Weight:      Height:        Intake/Output Summary (Last 24 hours) at 12/01/2020 0904 Last data filed at 11/30/2020 1346 Gross per 24 hour  Intake 55 ml  Output --  Net 55 ml   Filed Weights   11/30/20 0942  Weight: 77.1 kg    Examination:  General exam: Appears in pain, chronically ill appearing Respiratory system: few scattered rales Cardiovascular system: S1 & S2 heard, RRR. No JVD, murmurs, rubs, gallops or clicks. No pedal edema. Gastrointestinal system: Abdomen is nondistended, soft and nontender. No organomegaly or masses felt. Normal bowel sounds heard. Central nervous system: Alert and oriented. No focal neurological deficits. Extremities: Symmetric 5 x 5 power. Skin: No rashes, lesions or ulcers    Data Reviewed: I have personally reviewed following  labs and imaging studies  CBC: Recent Labs  Lab 11/25/20 0052 11/30/20 0940 12/01/20 0447  WBC 8.4 8.8 6.8  HGB 10.8* 11.5* 10.0*  HCT 33.0* 34.8* 30.0*  MCV 78.4* 77.3* 76.7*  PLT 593* 614* 235*   Basic Metabolic Panel: Recent Labs  Lab 11/25/20 0052 11/30/20 0940 12/01/20 0447  NA 137 137 135  K 3.5 3.5 3.5  CL 105 104 103  CO2 21* 22 25  GLUCOSE 132* 155* 117*  BUN 17 17 12   CREATININE 0.81 0.82 0.63  CALCIUM 8.7* 9.2 8.8*   GFR: Estimated Creatinine  Clearance: 87.4 mL/min (by C-G formula based on SCr of 0.63 mg/dL). Liver Function Tests: Recent Labs  Lab 11/25/20 0052  AST 20  ALT 22  ALKPHOS 44  BILITOT 1.0  PROT 7.0  ALBUMIN 2.8*   Recent Labs  Lab 11/25/20 0141  LIPASE 36   No results for input(s): AMMONIA in the last 168 hours. Coagulation Profile: No results for input(s): INR, PROTIME in the last 168 hours. Cardiac Enzymes: No results for input(s): CKTOTAL, CKMB, CKMBINDEX, TROPONINI in the last 168 hours. BNP (last 3 results) No results for input(s): PROBNP in the last 8760 hours. HbA1C: No results for input(s): HGBA1C in the last 72 hours. CBG: No results for input(s): GLUCAP in the last 168 hours. Lipid Profile: No results for input(s): CHOL, HDL, LDLCALC, TRIG, CHOLHDL, LDLDIRECT in the last 72 hours. Thyroid Function Tests: No results for input(s): TSH, T4TOTAL, FREET4, T3FREE, THYROIDAB in the last 72 hours. Anemia Panel: No results for input(s): VITAMINB12, FOLATE, FERRITIN, TIBC, IRON, RETICCTPCT in the last 72 hours. Urine analysis: No results found for: COLORURINE, APPEARANCEUR, LABSPEC, PHURINE, GLUCOSEU, HGBUR, BILIRUBINUR, KETONESUR, PROTEINUR, UROBILINOGEN, NITRITE, LEUKOCYTESUR Sepsis Labs: @LABRCNTIP (procalcitonin:4,lacticidven:4)  ) Recent Results (from the past 240 hour(s))  Resp Panel by RT-PCR (Flu A&B, Covid) Nasopharyngeal Swab     Status: None   Collection Time: 11/30/20  1:38 PM   Specimen: Nasopharyngeal Swab; Nasopharyngeal(NP) swabs in vial transport medium  Result Value Ref Range Status   SARS Coronavirus 2 by RT PCR NEGATIVE NEGATIVE Final    Comment: (NOTE) SARS-CoV-2 target nucleic acids are NOT DETECTED.  The SARS-CoV-2 RNA is generally detectable in upper respiratory specimens during the acute phase of infection. The lowest concentration of SARS-CoV-2 viral copies this assay can detect is 138 copies/mL. A negative result does not preclude SARS-Cov-2 infection and should  not be used as the sole basis for treatment or other patient management decisions. A negative result may occur with  improper specimen collection/handling, submission of specimen other than nasopharyngeal swab, presence of viral mutation(s) within the areas targeted by this assay, and inadequate number of viral copies(<138 copies/mL). A negative result must be combined with clinical observations, patient history, and epidemiological information. The expected result is Negative.  Fact Sheet for Patients:  EntrepreneurPulse.com.au  Fact Sheet for Healthcare Providers:  IncredibleEmployment.be  This test is no t yet approved or cleared by the [place] FDA and  has been authorized for detection and/or diagnosis of SARS-CoV-2 by FDA under an Emergency Use Authorization (EUA). This EUA will remain  in effect (meaning this test can be used) for the duration of the COVID-19 declaration under Section 564(b)(1) of the Act, 21 U.S.C.section 360bbb-3(b)(1), unless the authorization is terminated  or revoked sooner.       Influenza A by PCR NEGATIVE NEGATIVE Final   Influenza B by PCR NEGATIVE NEGATIVE Final    Comment: (NOTE) The Xpert Xpress  SARS-CoV-2/FLU/RSV plus assay is intended as an aid in the diagnosis of influenza from Nasopharyngeal swab specimens and should not be used as a sole basis for treatment. Nasal washings and aspirates are unacceptable for Xpert Xpress SARS-CoV-2/FLU/RSV testing.  Fact Sheet for Patients: EntrepreneurPulse.com.au  Fact Sheet for Healthcare Providers: IncredibleEmployment.be  This test is not yet approved or cleared by the [place] FDA and has been authorized for detection and/or diagnosis of SARS-CoV-2 by FDA under an Emergency Use Authorization (EUA). This EUA will remain in effect (meaning this test can be used) for the duration of the COVID-19 declaration under  Section 564(b)(1) of the Act, 21 U.S.C. section 360bbb-3(b)(1), unless the authorization is terminated or revoked.  Performed at [facility], [address] [place], [place] [address]          Radiology Studies: DG Chest 2 View  Result Date: 11/30/2020 CLINICAL DATA:  Chest pain, shortness of breath, chills EXAM: CHEST - 2 VIEW COMPARISON:  Chest radiograph and CTA chest 11/26/2010 FINDINGS: Chest wall port is stable in position. The cardiomediastinal silhouette is stable. Patchy opacity projecting over the left mid lung is unchanged. There is no new or progressive airspace disease. There is no pleural effusion or pneumothorax. The bones are stable. IMPRESSION: Unchanged masslike opacity projecting over the left midlung. No new or progressive airspace disease Electronically Signed   By: Valetta Mole M.D.   On: 11/30/2020 10:19   CT Angio Chest PE W and/or Wo Contrast  Result Date: 11/30/2020 CLINICAL DATA:  Chest pain, shortness of breath EXAM: CT ANGIOGRAPHY CHEST WITH CONTRAST TECHNIQUE: Multidetector CT imaging of the chest was performed using the standard protocol during bolus administration of intravenous contrast. Multiplanar CT image reconstructions and MIPs were obtained to evaluate the vascular anatomy. CONTRAST:  53mL OMNIPAQUE IOHEXOL 350 MG/ML SOLN COMPARISON:  11/25/2020 FINDINGS: Cardiovascular: Right IJ port catheter extends to right atrium. Heart size upper limits normal. Good contrast opacification of the pulmonary arterial tree. No definite acute PE, with significant image degradation through the lung bases secondary to confirm continued patient breathing motion during the study. Good contrast opacification of the thoracic aorta without dissection, aneurysm, or stenosis. Bovine variant brachiocephalic arterial origin anatomy without proximal stenosis. No significant atheromatous change. Mediastinum/Nodes: 1 cm pretracheal lymph node. Subcarinal adenopathy stable.  6.6 cm left hilar mass involving and occluding branches of the left pulmonary veins, abutting upper lower lobe bronchi, with central air bronchograms. A Lungs/Pleura: Left hilar mass extending into the inferior lingula and left lower lobe. Right lung clear. Some image degradation secondary to continued breathing during the acquisition. Upper Abdomen: Renal cysts.  No acute findings. Musculoskeletal: No chest wall abnormality. No acute or significant osseous findings. Review of the MIP images confirms the above findings. IMPRESSION: 1. Negative for acute PE or thoracic aortic dissection. 2. Persistent left hilar masslike process involving branches of the left pulmonary veins and extending to involve the inferior lingula and left lower lobe, suspicious for neoplasm, as has been previously described 3. No evidence of distal metastatic disease. Electronically Signed   By: Lucrezia Europe M.D.   On: 11/30/2020 12:40        Scheduled Meds:  enoxaparin (LOVENOX) injection  40 mg Subcutaneous Q24H   umeclidinium bromide  1 puff Inhalation Daily   And   fluticasone furoate-vilanterol  1 puff Inhalation Daily   magic mouthwash w/lidocaine  10 mL Oral QID   morphine  15 mg Oral Q12H   pantoprazole  40  mg Oral BID AC   sodium chloride flush  3 mL Intravenous Q12H   sucralfate  1 g Oral QID   Continuous Infusions:   LOS: 0 days    Time spent: 40 min    Desma Maxim, MD [facility]   If 7PM-7AM, please contact night-coverage [email or web address] Password [email or web address] 12/01/2020, 9:04 AM

## 2020-12-01 NOTE — Progress Notes (Signed)
  Chaplain On-Call responded to Spiritual Care Consult Order: "patient requests prayer".  Chaplain met the patient who described his medical history of challenges in breathing, in addition to the presence of a tumor. The patient stated that he is experiencing much pain in his chest.  Chaplain provided spiritual and emotional support and prayers for comfort and healing.  Chaplain Pollyann Samples M.Div., [facility]

## 2020-12-01 NOTE — Progress Notes (Signed)
Radiation Oncology Follow up Note  Name: Mitchell Frye   Date:   11/30/2020 MRN:  [id number] DOB: 02-Jul-1950    This 70 y.o. 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  REFERRING PROVIDER: No ref. provider found  HPI: Patient is a 70 year old patient admitted for severe chest pain and patient now about a year having completed concurrent chemoradiation therapy for stage IIIb adenocarcinoma left hilum.Marland Kitchen  He is initially been doing well with radiologic evidence of good response.  Over the past 6 weeks he has been to the ER 4 times for chest pain work-up to show no evidence of PE although there is evidence by CT criteria of progression of disease in the left hilum.  He does have a PET/CT scheduled.  He is having endoscopy today.  He is currently narcotic analgesics for his significant chest pain.  I been asked to evaluate him for possible palliative radiation therapy to his chest.  His pain is centered in the left portion of his chest.  His tumor may be involving the thoracic ribs.  COMPLICATIONS OF TREATMENT: none  FOLLOW UP COMPLIANCE: keeps appointments   PHYSICAL EXAM: Frail-appearing male in significant pain.  Well-developed well-nourished patient in NAD. HEENT reveals PERLA, EOMI, discs not visualized.  Oral cavity is clear. No oral mucosal lesions are identified. Neck is clear without evidence of cervical or supraclavicular adenopathy. Lungs are clear to A&P. Cardiac examination is essentially unremarkable with regular rate and rhythm without murmur rub or thrill. Abdomen is benign with no organomegaly or masses noted. Motor sensory and DTR levels are equal and symmetric in the upper and lower extremities. Cranial nerves II through XII are grossly intact. Proprioception is intact. No peripheral adenopathy or edema is identified. No motor or  sensory levels are noted. Crude visual fields are within normal range. BP 104/75   Pulse 91   Temp 98.3 F (36.8 C) (Temporal)   Resp (!) 29   Ht 5\' 10"  (1.778 m)   Wt 170 lb (77.1 kg)   SpO2 95%   BMI 24.39 kg/m    RADIOLOGY RESULTS: CT scans reviewed compatible with above-stated findings showing possible progression in the left lung.  PLAN: This time after head with a palliative course of radiation therapy to his left chest.  I will plan delivering 30 Gray in 10 fractions.  We will start him this week.  Risks and benefits of treatment including retreatment of a previously radiated area were reviewed with the patient.  We will stay away from his esophagus so there will be no chance of radiation esophagitis.  Patient comprehends my recommendations well.  I would like to take this opportunity to thank you for allowing me to participate in the care of your patient.Noreene Filbert, MD

## 2020-12-01 NOTE — Consult Note (Signed)
Mitchell Darby, MD [address]  [place]  [place], [place] [address]  Main: [phone]  Fax: [phone] Pager: [phone]   Consultation  Referring Provider:     No ref. provider found Primary Care Physician:  [facility] Primary Gastroenterologist: Althia Forts         Reason for Consultation:     Chest pain  Date of Admission:  11/30/2020 Date of Consultation:  12/01/2020         HPI:   Mitchell Frye is a 70 y.o. male with history of [name] adenocarcinoma of the lung, currently on maintenance immunotherapy who is admitted with recurrent episodes of chest pain.  Patient had CT angio, negative for pulmonary embolism.  Patient received chemoradiation in December 2021 and he had history of radiation esophagitis.  Patient is planned to have pulmonary consultation to evaluate for bronchoscopy as well as cardiology follow-up as outpatient.  He got admitted yesterday due to recurrent chest pain, midsternal, burning sensation, also reports some difficulty swallowing.  Troponin x2 negative, ACS was ruled out.  Patient was started on Protonix 40 mg twice daily by his oncologist for possible reflux and he ran out of his PPI.  Patient denies nausea or vomiting.  Patient received IV Pepcid, GI cocktail in the ER as well as pain medication.  Currently, he denies any burning in his chest.  Patient is started on clear liquid diet.  GI is consulted for further evaluation   NSAIDs: None  Antiplts/Anticoagulants/Anti thrombotics: None  GI Procedures: None  Past Medical History:  Diagnosis Date  . Dyspnea   . Hyperlipidemia   . Hypertension   . Primary malignant neoplasm of left lower lobe of lung [facility])     Past Surgical History:  Procedure Laterality Date  . PORTA CATH INSERTION N/A 10/22/2019   Procedure: PORTA CATH INSERTION;  Surgeon: Katha Cabal, MD;  Location: [place] [facility];  Service: Cardiovascular;  Laterality: N/A;  . VIDEO  BRONCHOSCOPY WITH ENDOBRONCHIAL ULTRASOUND N/A 09/25/2019   Procedure: VIDEO BRONCHOSCOPY WITH ENDOBRONCHIAL ULTRASOUND;  Surgeon: Tyler Pita, MD;  Location: [facility] ORS;  Service: Pulmonary;  Laterality: N/A;    Prior to Admission medications   Medication Sig Start Date End Date Taking? Authorizing Provider  albuterol (VENTOLIN HFA) 108 (90 Base) MCG/ACT inhaler Inhale 2 puffs into the lungs every 4 (four) hours as needed for wheezing or shortness of breath. 11/05/20  Yes Borders, Kirt Boys, NP  Budeson-Glycopyrrol-Formoterol (BREZTRI AEROSPHERE) 160-9-4.8 MCG/ACT AERO Inhale 2 puffs into the lungs in the morning and at bedtime. 11/13/20  Yes Martyn Ehrich, NP  clobetasol cream (TEMOVATE) 0.05 % Apply topically. 10/28/20  Yes [provider]  famotidine (PEPCID) 20 MG tablet Take 1 tablet (20 mg total) by mouth daily. 10/31/20 10/31/21 Yes Nance Pear, MD  HYDROcodone bit-homatropine (HYDROMET) 5-1.5 MG/5ML syrup Take 5 mLs by mouth every 6 (six) hours as needed for cough. 10/02/20  Yes Sindy Guadeloupe, MD  hydrOXYzine (ATARAX/VISTARIL) 25 MG tablet Take 25 mg by mouth daily. 06/11/20  Yes [provider]  lidocaine (XYLOCAINE) 2 % solution Use as directed 15 mLs in the mouth or throat every 6 (six) hours as needed (abdominal pain). 10/31/20  Yes Nance Pear, MD  lidocaine-prilocaine (EMLA) cream Apply 1 application topically once. 12/18/19  Yes [provider]  oxyCODONE 10 MG TABS Take 1 tablet (10 mg total) by mouth every 4 (four) hours as needed for severe pain. 11/25/20  Yes Alfred Levins, [place], MD  pantoprazole (PROTONIX) 40 MG tablet Take 1 tablet (24m) twice daily for two weeks and then take 1 tablet daily thereafter 11/05/20  Yes Borders, JKirt Boys NP  potassium chloride SA (KLOR-CON) 20 MEQ tablet TAKE 1 TABLET(20 MEQ) BY MOUTH TWICE DAILY 11/01/20  Yes RSindy Guadeloupe MD  sucralfate (CARAFATE) 1 g tablet Take 1 tablet (1 g total) by mouth 4 (four) times  daily. 11/05/20  Yes Borders, JKirt Boys NP  acetaminophen (TYLENOL) 500 MG tablet Take 1,000 mg by mouth every 6 (six) hours as needed for mild pain, moderate pain or headache. Patient not taking: No sig reported    [provider]  benzonatate (TESSALON PERLES) 100 MG capsule Take 1 capsule (100 mg total) by mouth every 6 (six) hours as needed for cough. Patient not taking: No sig reported 09/25/20 09/25/21  RSindy Guadeloupe MD  traZODone (DESYREL) 50 MG tablet TAKE 1 TABLET(50 MG) BY MOUTH AT BEDTIME Patient not taking: No sig reported 04/23/20   RSindy Guadeloupe MD  prochlorperazine (COMPAZINE) 10 MG tablet TAKE 1 TABLET(10 MG) BY MOUTH EVERY 6 HOURS AS NEEDED FOR NAUSEA OR VOMITING 10/22/19 11/25/19  RSindy Guadeloupe MD    Current Facility-Administered Medications:  .  albuterol (PROVENTIL) (2.5 MG/3ML) 0.083% nebulizer solution 2.5 mg, 2.5 mg, Inhalation, Q4H PRN, Agbata, Tochukwu, MD .  benzonatate (TESSALON) capsule 100 mg, 100 mg, Oral, Q6H PRN, Agbata, Tochukwu, MD .  enoxaparin (LOVENOX) injection 40 mg, 40 mg, Subcutaneous, Q24H, Agbata, Tochukwu, MD, 40 mg at 11/30/20 2115 .  umeclidinium bromide (INCRUSE ELLIPTA) 62.5 MCG/INH 1 puff, 1 puff, Inhalation, Daily, 1 puff at 12/01/20 0811 **AND** fluticasone furoate-vilanterol (BREO ELLIPTA) 200-25 MCG/INH 1 puff, 1 puff, Inhalation, Daily, BRenda Rolls RPH, 1 puff at 12/01/20 0809 .  magic mouthwash w/lidocaine, 10 mL, Oral, QID, Beers, BShanon Brow RPH, 10 mL at 12/01/20 0813 .  magic mouthwash w/lidocaine, 5 mL, Oral, TID PRN, Wouk, NAilene Rud MD .  morphine (MS CONTIN) 12 hr tablet 15 mg, 15 mg, Oral, Q12H, RSindy Guadeloupe MD, 15 mg at 12/01/20 1025 .  morphine 2 MG/ML injection 2 mg, 2 mg, Intravenous, Q4H PRN, Agbata, Tochukwu, MD, 2 mg at 12/01/20 0416 .  ondansetron (ZOFRAN) tablet 4 mg, 4 mg, Oral, Q6H PRN **OR** ondansetron (ZOFRAN) injection 4 mg, 4 mg, Intravenous, Q6H PRN, Agbata, Tochukwu, MD .  oxyCODONE (Oxy  IR/ROXICODONE) immediate release tablet 20 mg, 20 mg, Oral, Q4H PRN, RSindy Guadeloupe MD, 20 mg at 12/01/20 03299.  pantoprazole (PROTONIX) EC tablet 40 mg, 40 mg, Oral, BID AC, Wouk, NAilene Rud MD .  polyethylene glycol (MIRALAX / GLYCOLAX) packet 17 g, 17 g, Oral, Daily, Wouk, NAilene Rud MD, 17 g at 12/01/20 1025 .  senna (SENOKOT) tablet 8.6 mg, 1 tablet, Oral, Daily, Wouk, NAilene Rud MD, 8.6 mg at 12/01/20 1025 .  sodium chloride flush (NS) 0.9 % injection 3 mL, 3 mL, Intravenous, Q12H, Agbata, Tochukwu, MD, 3 mL at 12/01/20 0815 .  sucralfate (CARAFATE) tablet 1 g, 1 g, Oral, QID, Agbata, Tochukwu, MD, 1 g at 12/01/20 02426 Family History  Problem Relation Age of Onset  . Cancer Brother      Social History   Tobacco Use  . Smoking status: Former    Packs/day: 1.00    Years: 20.00    Pack years: 20.00    Types: Cigarettes    Quit date: 1994    Years since  quitting: 28.8  . Smokeless tobacco: Never  Vaping Use  . Vaping Use: Never used  Substance Use Topics  . Alcohol use: Not Currently    Comment: not drank any beer in 2 onths  . Drug use: Never    Allergies as of 11/30/2020 - Review Complete 11/30/2020  Allergen Reaction Noted  . Taxol [paclitaxel] Other (See Comments) 10/28/2019    Review of Systems:    All systems reviewed and negative except where noted in HPI.   Physical Exam:  Vital signs in last 24 hours: Temp:  [97.3 F (36.3 C)-98.6 F (37 C)] 98.6 F (37 C) (10/18 0915) Pulse Rate:  [84-111] 101 (10/18 0915) Resp:  [18-27] 20 (10/18 0915) BP: (107-153)/(67-97) 123/67 (10/18 0915) SpO2:  [92 %-97 %] 96 % (10/18 0915)   General:   Pleasant, cooperative in NAD Head:  Normocephalic and atraumatic. Eyes:   No icterus.   Conjunctiva pink. PERRLA. Ears:  Normal auditory acuity. Neck:  Supple; no masses or thyroidomegaly Lungs: Respirations even and unlabored. Lungs clear to auscultation bilaterally.   No wheezes, crackles, or rhonchi.  Heart:   Regular rate and rhythm;  Without murmur, clicks, rubs or gallops Abdomen:  Soft, nondistended, nontender. Normal bowel sounds. No appreciable masses or hepatomegaly.  No rebound or guarding.  Rectal:  Not performed. Msk:  Symmetrical without gross deformities.  Strength generalized weakness Extremities:  Without edema, cyanosis or clubbing. Neurologic:  Alert and oriented x3;  grossly normal neurologically. Skin:  Intact without significant lesions or rashes. Psych:  Alert and cooperative. Normal affect.  LAB RESULTS: CBC Latest Ref Rng & Units 12/01/2020 11/30/2020 11/25/2020  WBC 4.0 - 10.5 K/uL 6.8 8.8 8.4  Hemoglobin 13.0 - 17.0 g/dL 10.0(L) 11.5(L) 10.8(L)  Hematocrit 39.0 - 52.0 % 30.0(L) 34.8(L) 33.0(L)  Platelets 150 - 400 K/uL 496(H) 614(H) 593(H)    BMET BMP Latest Ref Rng & Units 12/01/2020 11/30/2020 11/25/2020  Glucose 70 - 99 mg/dL 117(H) 155(H) 132(H)  BUN 8 - 23 mg/dL _0 Creatinine 0.61 - 1.24 mg/dL 0.63 0.82 0.81  Sodium 135 - 145 mmol/L 135 137 137  Potassium 3.5 - 5.1 mmol/L 3.5 3.5 3.5  Chloride 98 - 111 mmol/L 103 104 105  CO2 22 - 32 mmol/L 25 22 21(L)  Calcium 8.9 - 10.3 mg/dL 8.8(L) 9.2 8.7(L)    LFT Hepatic Function Latest Ref Rng & Units 11/25/2020 11/13/2020 11/13/2020  Total Protein 6.5 - 8.1 g/dL 7.0 6.3(L) 6.9  Albumin 3.5 - 5.0 g/dL 2.8(L) 2.9(L) 2.9(L)  AST 15 - 41 U/L _1 ALT 0 - 44 U/L _2 Alk Phosphatase 38 - 126 U/L 44 43 48  Total Bilirubin 0.3 - 1.2 mg/dL 1.0 0.7 0.6  Bilirubin, Direct 0.0 - 0.2 mg/dL - 0.1 -     STUDIES: DG Chest 2 View  Result Date: 11/30/2020 CLINICAL DATA:  Chest pain, shortness of breath, chills EXAM: CHEST - 2 VIEW COMPARISON:  Chest radiograph and CTA chest 11/26/2010 FINDINGS: Chest wall port is stable in position. The cardiomediastinal silhouette is stable. Patchy opacity projecting over the left mid lung is unchanged. There is no new or progressive airspace disease. There is no pleural  effusion or pneumothorax. The bones are stable. IMPRESSION: Unchanged masslike opacity projecting over the left midlung. No new or progressive airspace disease Electronically Signed   By: Valetta Mole M.D.   On: 11/30/2020 10:19   CT Angio Chest PE W and/or Wo  Contrast  Result Date: 11/30/2020 CLINICAL DATA:  Chest pain, shortness of breath EXAM: CT ANGIOGRAPHY CHEST WITH CONTRAST TECHNIQUE: Multidetector CT imaging of the chest was performed using the standard protocol during bolus administration of intravenous contrast. Multiplanar CT image reconstructions and MIPs were obtained to evaluate the vascular anatomy. CONTRAST:  60m OMNIPAQUE IOHEXOL 350 MG/ML SOLN COMPARISON:  11/25/2020 FINDINGS: Cardiovascular: Right IJ port catheter extends to right atrium. Heart size upper limits normal. Good contrast opacification of the pulmonary arterial tree. No definite acute PE, with significant image degradation through the lung bases secondary to confirm continued patient breathing motion during the study. Good contrast opacification of the thoracic aorta without dissection, aneurysm, or stenosis. Bovine variant brachiocephalic arterial origin anatomy without proximal stenosis. No significant atheromatous change. Mediastinum/Nodes: 1 cm pretracheal lymph node. Subcarinal adenopathy stable. 6.6 cm left hilar mass involving and occluding branches of the left pulmonary veins, abutting upper lower lobe bronchi, with central air bronchograms. A Lungs/Pleura: Left hilar mass extending into the inferior lingula and left lower lobe. Right lung clear. Some image degradation secondary to continued breathing during the acquisition. Upper Abdomen: Renal cysts.  No acute findings. Musculoskeletal: No chest wall abnormality. No acute or significant osseous findings. Review of the MIP images confirms the above findings. IMPRESSION: 1. Negative for acute PE or thoracic aortic dissection. 2. Persistent left hilar masslike process  involving branches of the left pulmonary veins and extending to involve the inferior lingula and left lower lobe, suspicious for neoplasm, as has been previously described 3. No evidence of distal metastatic disease. Electronically Signed   By: DLucrezia EuropeM.D.   On: 11/30/2020 12:40      Impression / Plan:   ADaneil Frye a 70y.o. male with [name] adenocarcinoma of the lung, history of chemoradiation almost a year ago, currently on maintenance immunotherapy who is admitted with recurrent chest pains.  Patient had several CT chest angio to rule out PE and has been negative.  He does have hilar lymphadenopathy.  He has history of radiation esophagitis, reflux, treated with Protonix 40 twice daily Differentials include Candida esophagitis or erosive esophagitis or esophageal ulcers or radiation esophagitis or peptic ulcer disease Recommend EGD for further evaluation, plan for today Continue Protonix 40 mg IV twice daily N.p.o. until EGD is performed  I have discussed alternative options, risks & benefits,  which include, but are not limited to, bleeding, infection, perforation,respiratory complication & drug reaction.  The patient agrees with this plan & written consent will be obtained.    Thank you for involving me in the care of this patient.      LOS: 0 days   RSherri Sear MD  12/01/2020, 12:10 PM    Note: This dictation was prepared with Dragon dictation along with smaller phrase technology. Any transcriptional errors that result from this process are unintentional.

## 2020-12-01 NOTE — Anesthesia Procedure Notes (Signed)
Date/Time: 12/01/2020 1:39 PM Performed by: Doreen Salvage, CRNA Pre-anesthesia Checklist: Patient identified, Emergency Drugs available, Suction available and Patient being monitored Patient Re-evaluated:Patient Re-evaluated prior to induction Oxygen Delivery Method: Nasal cannula Induction Type: IV induction Dental Injury: Teeth and Oropharynx as per pre-operative assessment  Comments: Nasal cannula with etCO2 monitoring

## 2020-12-02 ENCOUNTER — Ambulatory Visit: Payer: Medicare Other

## 2020-12-02 ENCOUNTER — Encounter: Payer: Self-pay | Admitting: Gastroenterology

## 2020-12-02 DIAGNOSIS — Z510 Encounter for antineoplastic radiation therapy: Secondary | ICD-10-CM | POA: Insufficient documentation

## 2020-12-02 DIAGNOSIS — D75838 Other thrombocytosis: Secondary | ICD-10-CM

## 2020-12-02 DIAGNOSIS — R0789 Other chest pain: Secondary | ICD-10-CM

## 2020-12-02 DIAGNOSIS — C3432 Malignant neoplasm of lower lobe, left bronchus or lung: Secondary | ICD-10-CM | POA: Insufficient documentation

## 2020-12-02 DIAGNOSIS — Z515 Encounter for palliative care: Secondary | ICD-10-CM

## 2020-12-02 DIAGNOSIS — G893 Neoplasm related pain (acute) (chronic): Secondary | ICD-10-CM | POA: Diagnosis not present

## 2020-12-02 LAB — BASIC METABOLIC PANEL
Anion gap: 9 (ref 5–15)
BUN: 16 mg/dL (ref 8–23)
CO2: 26 mmol/L (ref 22–32)
Calcium: 9 mg/dL (ref 8.9–10.3)
Chloride: 101 mmol/L (ref 98–111)
Creatinine, Ser: 0.77 mg/dL (ref 0.61–1.24)
GFR, Estimated: >60 mL/min (ref >60)
Glucose, Bld: 123 mg/dL — ABNORMAL HIGH (ref 70–99)
Potassium: 3.8 mmol/L (ref 3.5–5.1)
Sodium: 136 mmol/L (ref 135–145)

## 2020-12-02 MED ORDER — MORPHINE SULFATE (PF) 4 MG/ML IV SOLN
4.0000 mg | Freq: Once | INTRAVENOUS | Status: AC
  Administered 2020-12-02: 4 mg via INTRAVENOUS
  Filled 2020-12-02: qty 1

## 2020-12-02 MED ORDER — MORPHINE SULFATE ER 15 MG PO TBCR
15.0000 mg | EXTENDED_RELEASE_TABLET | Freq: Three times a day (TID) | ORAL | Status: DC
  Administered 2020-12-02 – 2020-12-04 (×5): 15 mg via ORAL
  Filled 2020-12-02 (×5): qty 1

## 2020-12-02 MED ORDER — SENNA 8.6 MG PO TABS
1.0000 | ORAL_TABLET | Freq: Two times a day (BID) | ORAL | Status: DC
  Administered 2020-12-02 – 2020-12-04 (×4): 8.6 mg via ORAL
  Filled 2020-12-02 (×4): qty 1

## 2020-12-02 MED ORDER — GABAPENTIN 300 MG PO CAPS
300.0000 mg | ORAL_CAPSULE | Freq: Two times a day (BID) | ORAL | Status: DC
  Administered 2020-12-02 – 2020-12-05 (×7): 300 mg via ORAL
  Filled 2020-12-02 (×7): qty 1

## 2020-12-02 NOTE — Evaluation (Signed)
Occupational Therapy Evaluation Patient Details Name: Mitchell Frye MRN: [id number] DOB: May 01, 1950 Today's Date: 12/02/2020   History of Present Illness 70 y.o. male with medical history significant for hypertension, dyslipidemia, history of adenocarcinoma of the lung status post concurrent chemoradiation therapy and currently on MAB which he receives every 4 weeks, history of radiation esophagitis and hypertension.  He presently emergency room with severe chest pain.  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  Chest pain was due to lung cancer.   Clinical Impression   Patient presenting with decreased Ind in self care, balance, functional mobility/transfers, endurance, and safety awareness. Patient reports reports living at home alone. He has intermittent assistance from sister. He does not drive a car and is concerned about medications and meals. Pt reports independence at baseline but has had several falls recently at home and endorses weakness. Pt with chest pain this session and gets medication while therapist in room. He reports feeling "this way" for several months now. Pt attempting to stand from bed with multiple attempts before able to stand with min guard. Pt ambulating in room without use of AD with 1 LOB resulting in min A to correct. Pt would like to go home with support. Patient will benefit from acute OT to increase overall independence in the areas of ADLs, functional mobility, and safety awareness in order to safely discharge home with family.      Recommendations for follow up therapy are one component of a multi-disciplinary discharge planning process, led by the attending physician.  Recommendations may be updated based on patient status, additional functional criteria and insurance authorization.   Follow Up Recommendations  Home health OT;Supervision/Assistance - 24 hour    Equipment Recommendations  3 in 1 bedside commode;Other  (comment) (RW - equipment needs for energy conservation)       Precautions / Restrictions Precautions Precautions: Fall Precaution Comments: chest pain      Mobility Bed Mobility Overal bed mobility: Modified Independent             General bed mobility comments: HOB elevated and increased time    Transfers Overall transfer level: Needs assistance Equipment used: 1 person hand held assist Transfers: Sit to/from [employer] Transfers Sit to Stand: Min guard;Supervision Stand pivot transfers: Min assist       General transfer comment: multiple attempts to stand from bed and pt with LOB while standing requiring min A to correct for safety    Balance Overall balance assessment: Needs assistance Sitting-balance support: Feet supported;Bilateral upper extremity supported Sitting balance-Leahy Scale: Good     Standing balance support: During functional activity Standing balance-Leahy Scale: Fair                             ADL either performed or assessed with clinical judgement   ADL Overall ADL's : Needs assistance/impaired                                       General ADL Comments: min guard - min A and pt fatigues quickly     Vision Patient Visual Report: No change from baseline              Pertinent Vitals/Pain Pain Assessment: 0-10 Pain Score: 8  Pain Location: chest Pain Descriptors / Indicators: Discomfort;Grimacing;Guarding Pain Intervention(s): Limited activity  within patient's tolerance;Monitored during session;Repositioned;RN gave pain meds during session     Hand Dominance Right   Extremity/Trunk Assessment Upper Extremity Assessment Upper Extremity Assessment: Generalized weakness   Lower Extremity Assessment Lower Extremity Assessment: Generalized weakness       Communication Communication Communication: Expressive difficulties;Other (comment) (speech is difficult to understand likely due to chest  pain)   Cognition Arousal/Alertness: Awake/alert Behavior During Therapy: WFL for tasks assessed/performed Overall Cognitive Status: Within Functional Limits for tasks assessed                                                Home Living Family/patient expects to be discharged to:: Private residence Living Arrangements: Alone Available Help at Discharge: Family;Available PRN/intermittently Type of Home: House Home Access: Stairs to enter [employer] of Steps: 3 Entrance Stairs-Rails: None Home Layout: One level     Bathroom Shower/Tub: Technical sales engineer: No   Home Equipment: None          Prior Functioning/Environment Level of Independence: Independent        Comments: Pt reports multiple falls at home and having chest pain for ~ 3 months        OT Problem List: Decreased strength;Decreased activity tolerance;Impaired balance (sitting and/or standing);Decreased safety awareness;Pain;Decreased knowledge of use of DME or AE      OT Treatment/Interventions: Self-care/ADL training;Therapeutic exercise;Therapeutic activities;Energy conservation;Patient/family education;DME and/or AE instruction;Balance training;Manual therapy    OT Goals(Current goals can be found in the care plan section) Acute Rehab OT Goals Patient Stated Goal: to decrease pain OT Goal Formulation: With patient Time For Goal Achievement: 12/16/20 Potential to Achieve Goals: Good ADL Goals Pt Will Perform Grooming: with supervision;standing Pt Will Perform Lower Body Dressing: sit to/from stand Pt Will Transfer to Toilet: with supervision;ambulating Pt Will Perform Toileting - Clothing Manipulation and hygiene: with supervision;sit to/from stand  OT Frequency: Min 2X/week   Barriers to D/C: Decreased caregiver support             AM-PAC OT "6 Clicks" Daily Activity     Outcome Measure Help from another person eating meals?: None Help  from another person taking care of personal grooming?: None Help from another person toileting, which includes using toliet, bedpan, or urinal?: A Little Help from another person bathing (including washing, rinsing, drying)?: A Little Help from another person to put on and taking off regular upper body clothing?: None Help from another person to put on and taking off regular lower body clothing?: A Little 6 Click Score: 21   End of Session Nurse Communication: Mobility status;Patient requests pain meds  Activity Tolerance: Patient limited by fatigue Patient left: in bed;with call bell/phone within reach;with bed alarm set  OT Visit Diagnosis: Unsteadiness on feet (R26.81);Repeated falls (R29.6);Muscle weakness (generalized) (M62.81);Pain Pain - part of body:  (chest)                Time: 1610-9604 OT Time Calculation (min): 23 min Charges:  OT General Charges $OT Visit: 1 Visit OT Evaluation $OT Eval Moderate Complexity: 1 Mod OT Treatments $Therapeutic Activity: 8-22 mins  Darleen Crocker, MS, OTR/L , CBIS ascom [phone]  12/02/20, 3:09 PM

## 2020-12-02 NOTE — Telephone Encounter (Signed)
Pt still admitted as of 12/02/20 10:47 AM

## 2020-12-02 NOTE — Consult Note (Signed)
[place]  [phone] [phone]   Name: Mitchell Frye Date: 12/02/2020 MRN: [id number]  DOB: 12/04/1950  Patient Care Team: Center, [facility] as PCP - Lillia Mountain, [place], RN as Oncology Nurse Navigator    REASON FOR CONSULTATION: Mitchell Frye is a 70 y.o. 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  Patient is status post concurrent chemoradiation now on maintenance immunotherapy with durvalumab.  Patient was admitted to hospital 11/30/2020 with intractable chest pain.  Work-up including CT of the chest was negative for PE or cardiovascular etiology.  He underwent EGD.  Symptoms are thought secondary to recurrence of hilar malignancy.  He was referred to palliative care to help address goals and manage ongoing symptoms.   SOCIAL HISTORY:     reports that he quit smoking about 28 years ago. His smoking use included cigarettes. He has a 20.00 pack-year smoking history. He has never used smokeless tobacco. He reports that he does not currently use alcohol. He reports that he does not use drugs.  Patient lives at home with his sister who is his primary caregiver.  He does not drive.  He also has several brothers who are involved in his care.  Patient is unmarried and has no children.  ADVANCE DIRECTIVES:  Does not have  CODE STATUS: Full code  PAST MEDICAL HISTORY: 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 ([place])     PAST SURGICAL HISTORY:  Past Surgical History:  Procedure Laterality Date   ESOPHAGOGASTRODUODENOSCOPY (EGD) WITH PROPOFOL N/A 12/01/2020   Procedure: ESOPHAGOGASTRODUODENOSCOPY (EGD) WITH PROPOFOL;  Surgeon: Lin Landsman, MD;  Location: [facility] ENDOSCOPY;  Service:  Gastroenterology;  Laterality: N/A;   PORTA CATH INSERTION N/A 10/22/2019   Procedure: PORTA CATH INSERTION;  Surgeon: Katha Cabal, MD;  Location: [place] [facility];  Service: Cardiovascular;  Laterality: N/A;   VIDEO BRONCHOSCOPY WITH ENDOBRONCHIAL ULTRASOUND N/A 09/25/2019   Procedure: VIDEO BRONCHOSCOPY WITH ENDOBRONCHIAL ULTRASOUND;  Surgeon: Tyler Pita, MD;  Location: [facility] ORS;  Service: Pulmonary;  Laterality: N/A;    HEMATOLOGY/ONCOLOGY HISTORY:  Oncology History  Malignant neoplasm of lower lobe of left lung ([place])  10/04/2019 Initial Diagnosis   Malignant neoplasm of lower lobe of left lung ([place])   10/04/2019 Cancer Staging   Staging form: Lung, AJCC 8th Edition - Clinical stage from 10/04/2019: Stage IIIB (cT2b, cN3, cM0) - Signed by Mitchell Guadeloupe, MD on 10/04/2019   10/28/2019 - 11/18/2019 Chemotherapy   The patient had dexamethasone (DECADRON) 4 MG tablet, 8 mg, Oral, Daily, 1 of 1 cycle, Start date: 10/04/2019, End date: -- palonosetron (ALOXI) injection 0.25 mg, 0.25 mg, Intravenous,  Once, 4 of 4 cycles Administration: 0.25 mg (10/28/2019), 0.25 mg (11/04/2019), 0.25 mg (11/11/2019), 0.25 mg (11/18/2019) PACLitaxel-protein bound (ABRAXANE) chemo infusion 200 mg, 100 mg/m2 = 200 mg (100 % of original dose 100 mg/m2), Intravenous,  Once, 4 of 4 cycles Dose modification: 100 mg/m2 (original dose 100 mg/m2, Cycle 2), 80 mg/m2 (original dose 100 mg/m2, Cycle 3, Reason: Other (see comments), Comment: neutropenia), 100 mg/m2 (original dose 100 mg/m2, Cycle 1) Administration: 200 mg (11/04/2019), 175 mg (11/11/2019), 200 mg (10/29/2019), 175 mg (11/18/2019) CARBOplatin (PARAPLATIN) 210 mg in sodium chloride 0.9 % 250 mL chemo infusion, 210  mg (100 % of original dose 214.6 mg), Intravenous,  Once, 4 of 4 cycles Dose modification:   (original dose 214.6 mg, Cycle 1) Administration: 210 mg (10/28/2019), 210 mg (11/04/2019), 210 mg (11/11/2019), 210 mg (11/18/2019) PACLitaxel (TAXOL) 90 mg  in sodium chloride 0.9 % 250 mL chemo infusion (</= 80mg /m2), 45 mg/m2 = 90 mg, Intravenous,  Once, 1 of 1 cycle Administration: 90 mg (10/28/2019)   for chemotherapy treatment.     11/25/2019 - 11/25/2019 Chemotherapy   The patient had palonosetron (ALOXI) injection 0.25 mg, 0.25 mg, Intravenous,  Once, 1 of 1 cycle Administration: 0.25 mg (11/25/2019) PEMEtrexed (ALIMTA) 1,000 mg in sodium chloride 0.9 % 100 mL chemo infusion, 500 mg/m2 = 1,000 mg, Intravenous,  Once, 1 of 1 cycle Administration: 1,000 mg (11/25/2019) CARBOplatin (PARAPLATIN) 550 mg in sodium chloride 0.9 % 250 mL chemo infusion, 550 mg (100 % of original dose 548 mg), Intravenous,  Once, 1 of 1 cycle Dose modification:   (original dose 548 mg, Cycle 1) Administration: 550 mg (11/25/2019) fosaprepitant (EMEND) 150 mg in sodium chloride 0.9 % 145 mL IVPB, 150 mg, Intravenous,  Once, 1 of 1 cycle Administration: 150 mg (11/25/2019)   for chemotherapy treatment.     01/14/2020 -  Chemotherapy   Patient is on Treatment Plan : LUNG Durvalumab q14d       ALLERGIES:  is allergic to taxol [paclitaxel].  MEDICATIONS:  Current Facility-Administered Medications  Medication Dose Route Frequency Provider Last Rate Last Admin   albuterol (PROVENTIL) (2.5 MG/3ML) 0.083% nebulizer solution 2.5 mg  2.5 mg Inhalation Q4H PRN Agbata, Tochukwu, MD       alum & mag hydroxide-simeth (MAALOX/MYLANTA) 200-200-20 MG/5ML suspension 15 mL  15 mL Oral Q4H PRN Wouk, Ailene Rud, MD   15 mL at 12/02/20 0255   benzonatate (TESSALON) capsule 100 mg  100 mg Oral Q6H PRN Agbata, Tochukwu, MD       enoxaparin (LOVENOX) injection 40 mg  40 mg Subcutaneous Q24H Agbata, Tochukwu, MD   40 mg at 12/01/20 2204   umeclidinium bromide (INCRUSE ELLIPTA) 62.5 MCG/INH 1 puff  1 puff Inhalation Daily Renda Rolls, RPH   1 puff at 12/02/20 0806   And   fluticasone furoate-vilanterol (BREO ELLIPTA) 200-25 MCG/INH 1 puff  1 puff Inhalation Daily Renda Rolls, RPH   1 puff at 12/02/20 0806   gabapentin (NEURONTIN) capsule 300 mg  300 mg Oral BID Mitchell Guadeloupe, MD   300 mg at 12/02/20 1048   morphine (MS CONTIN) 12 hr tablet 15 mg  15 mg Oral Q8H Coy Vandoren, Kirt Boys, NP       morphine 2 MG/ML injection 2 mg  2 mg Intravenous Q4H PRN Agbata, Tochukwu, MD   2 mg at 12/02/20 0804   ondansetron (ZOFRAN) tablet 4 mg  4 mg Oral Q6H PRN Agbata, Tochukwu, MD       Or   ondansetron (ZOFRAN) injection 4 mg  4 mg Intravenous Q6H PRN Agbata, Tochukwu, MD       oxyCODONE (Oxy IR/ROXICODONE) immediate release tablet 20 mg  20 mg Oral Q4H PRN Mitchell Guadeloupe, MD   20 mg at 12/01/20 1735   pantoprazole (PROTONIX) EC tablet 40 mg  40 mg Oral BID AC Wouk, Ailene Rud, MD   40 mg at 12/02/20 0802   polyethylene glycol (MIRALAX / GLYCOLAX) packet 17 g  17 g Oral Daily Gwynne Edinger, MD   17 g at 12/02/20 [phone]  senna (SENOKOT) tablet 8.6 mg  1 tablet Oral BID Ameera Tigue, Kirt Boys, NP       sodium chloride flush (NS) 0.9 % injection 3 mL  3 mL Intravenous Q12H Agbata, Tochukwu, MD   3 mL at 12/02/20 1050   sucralfate (CARAFATE) tablet 1 g  1 g Oral QID Agbata, Tochukwu, MD   1 g at 12/02/20 0802    VITAL SIGNS: BP 122/79 (BP Location: Left Arm)   Pulse 88   Temp 98.1 F (36.7 C) (Oral)   Resp 18   Ht 5\' 10"  (1.778 m)   Wt 170 lb (77.1 kg)   SpO2 93%   BMI 24.39 kg/m  Filed Weights   11/30/20 0942  Weight: 170 lb (77.1 kg)    Estimated body mass index is 24.39 kg/m as calculated from the following:   Height as of this encounter: 5\' 10"  (1.778 m).   Weight as of this encounter: 170 lb (77.1 kg).  LABS: CBC:    Component Value Date/Time   WBC 6.8 12/01/2020 0447   HGB 10.0 (L) 12/01/2020 0447   HCT 30.0 (L) 12/01/2020 0447   PLT 496 (H) 12/01/2020 0447   MCV 76.7 (L) 12/01/2020 0447   NEUTROABS 4.1 11/13/2020 0852   LYMPHSABS 1.1 11/13/2020 0852   MONOABS 0.6 11/13/2020 0852   EOSABS 0.0 11/13/2020 0852   BASOSABS 0.0 11/13/2020 0852    Comprehensive Metabolic Panel:    Component Value Date/Time   NA 136 12/02/2020 0501   K 3.8 12/02/2020 0501   CL 101 12/02/2020 0501   CO2 26 12/02/2020 0501   BUN 16 12/02/2020 0501   CREATININE 0.77 12/02/2020 0501   GLUCOSE 123 (H) 12/02/2020 0501   CALCIUM 9.0 12/02/2020 0501   AST 20 11/25/2020 0052   ALT 22 11/25/2020 0052   ALKPHOS 44 11/25/2020 0052   BILITOT 1.0 11/25/2020 0052   PROT 7.0 11/25/2020 0052   ALBUMIN 2.8 (L) 11/25/2020 0052    RADIOGRAPHIC STUDIES: DG Chest 2 View  Result Date: 11/30/2020 CLINICAL DATA:  Chest pain, shortness of breath, chills EXAM: CHEST - 2 VIEW COMPARISON:  Chest radiograph and CTA chest 11/26/2010 FINDINGS: Chest wall port is stable in position. The cardiomediastinal silhouette is stable. Patchy opacity projecting over the left mid lung is unchanged. There is no new or progressive airspace disease. There is no pleural effusion or pneumothorax. The bones are stable. IMPRESSION: Unchanged masslike opacity projecting over the left midlung. No new or progressive airspace disease Electronically Signed   By: Valetta Mole M.D.   On: 11/30/2020 10:19   DG Chest 2 View  Result Date: 11/25/2020 CLINICAL DATA:  Chest pain EXAM: CHEST - 2 VIEW COMPARISON:  11/13/2020 FINDINGS: Masslike consolidation within the left lung base is again seen and appears stable since prior examination. Right lung is clear. No pneumothorax or pleural effusion. Right internal jugular chest port is seen with its tip at the superior right atrium. Cardiac size within normal limits. No acute bone abnormality. IMPRESSION: Stable masslike consolidation within the left lower lobe. Electronically Signed   By: Fidela Salisbury M.D.   On: 11/25/2020 01:29   DG Chest 2 View  Result Date: 11/13/2020 CLINICAL DATA:  A 70 year old male presents with chest pain in the setting of ongoing systemic therapy for pulmonary neoplasm. EXAM: CHEST - 2 VIEW COMPARISON:  October 30, 2020.  FINDINGS: RIGHT Port-A-Cath in-situ, tip at the upper portion of the RIGHT atrium as on previous imaging. Stable LEFT perihilar  masslike area with no new areas of consolidation. No visible pneumothorax. No sign of pleural effusion. On limited assessment no acute skeletal process. IMPRESSION: Stable chest x-ray. No acute changes with unchanged appearance of LEFT hilar distortion and masslike appearance. Electronically Signed   By: Zetta Bills M.D.   On: 11/13/2020 14:06   CT Angio Chest PE W and/or Wo Contrast  Result Date: 11/30/2020 CLINICAL DATA:  Chest pain, shortness of breath EXAM: CT ANGIOGRAPHY CHEST WITH CONTRAST TECHNIQUE: Multidetector CT imaging of the chest was performed using the standard protocol during bolus administration of intravenous contrast. Multiplanar CT image reconstructions and MIPs were obtained to evaluate the vascular anatomy. CONTRAST:  39mL OMNIPAQUE IOHEXOL 350 MG/ML SOLN COMPARISON:  11/25/2020 FINDINGS: Cardiovascular: Right IJ port catheter extends to right atrium. Heart size upper limits normal. Good contrast opacification of the pulmonary arterial tree. No definite acute PE, with significant image degradation through the lung bases secondary to confirm continued patient breathing motion during the study. Good contrast opacification of the thoracic aorta without dissection, aneurysm, or stenosis. Bovine variant brachiocephalic arterial origin anatomy without proximal stenosis. No significant atheromatous change. Mediastinum/Nodes: 1 cm pretracheal lymph node. Subcarinal adenopathy stable. 6.6 cm left hilar mass involving and occluding branches of the left pulmonary veins, abutting upper lower lobe bronchi, with central air bronchograms. A Lungs/Pleura: Left hilar mass extending into the inferior lingula and left lower lobe. Right lung clear. Some image degradation secondary to continued breathing during the acquisition. Upper Abdomen: Renal cysts.  No acute findings.  Musculoskeletal: No chest wall abnormality. No acute or significant osseous findings. Review of the MIP images confirms the above findings. IMPRESSION: 1. Negative for acute PE or thoracic aortic dissection. 2. Persistent left hilar masslike process involving branches of the left pulmonary veins and extending to involve the inferior lingula and left lower lobe, suspicious for neoplasm, as has been previously described 3. No evidence of distal metastatic disease. Electronically Signed   By: Lucrezia Europe M.D.   On: 11/30/2020 12:40   CT Angio Chest PE W and/or Wo Contrast  Result Date: 11/25/2020 CLINICAL DATA:  Three-month history of chest pain. History of lung cancer. EXAM: CT ANGIOGRAPHY CHEST WITH CONTRAST TECHNIQUE: Multidetector CT imaging of the chest was performed using the standard protocol during bolus administration of intravenous contrast. Multiplanar CT image reconstructions and MIPs were obtained to evaluate the vascular anatomy. CONTRAST:  39mL OMNIPAQUE IOHEXOL 350 MG/ML SOLN COMPARISON:  11/13/2020 FINDINGS: Cardiovascular: The heart is mildly enlarged but stable. No pericardial effusion. Mild tortuosity of the thoracic aorta but no aneurysm or dissection. Minimal atherosclerotic calcification at the aortic arch. The branch vessels are normal. No obvious coronary artery calcifications. The pulmonary arterial tree is fairly well opacified. No findings suspicious for pulmonary embolism. Extensive tumor surrounding the left pulmonary arteries. Mediastinum/Nodes: Persistent ill-defined soft tissue density involving the left hilar region and subcarinal space likely infiltrating tumor. Small stable right-sided hilar lymph nodes. Lungs/Pleura: Persistent and slightly progressive ill-defined soft tissue density involving the left hilum and left paramediastinal lung surrounding the left mainstem bronchus and left pulmonary artery consistent with recurrent tumor. There is also surrounding interstitial  thickening and vague nodularity the left upper lobe and left lower lobe which could be obstructive pneumonitis or interstitial spread of tumor. The right lung remains relatively clear except for streaky right basilar atelectasis. No pulmonary lesions. No pleural effusions. Upper Abdomen: No significant upper abdominal findings. No hepatic or adrenal gland lesions. No upper abdominal adenopathy. Bilateral renal  cysts are noted. Musculoskeletal: No significant bony findings. Review of the MIP images confirms the above findings. IMPRESSION: 1. No CT findings for pulmonary embolism. 2. Persistent and slightly progressive ill-defined soft tissue density involving the left hilum and left paramediastinal lung most consistent with recurrent tumor. 3. Surrounding interstitial thickening and vague nodularity in the left upper lobe and left lower lobe could be obstructive pneumonitis or interstitial spread of tumor. 4. Left hilar and subcarinal adenopathy. 5. No findings for upper abdominal metastatic disease. Electronically Signed   By: Marijo Sanes M.D.   On: 11/25/2020 05:26   CT Angio Chest PE W and/or Wo Contrast  Result Date: 11/13/2020 CLINICAL DATA:  71 year old male with history of chest pain and shortness of breath. History of lung cancer status post chemotherapy and radiation therapy (complete 10 months ago). Recent diagnosis of COVID infection 10/20/2020. EXAM: CT ANGIOGRAPHY CHEST WITH CONTRAST TECHNIQUE: Multidetector CT imaging of the chest was performed using the standard protocol during bolus administration of intravenous contrast. Multiplanar CT image reconstructions and MIPs were obtained to evaluate the vascular anatomy. CONTRAST:  15mL OMNIPAQUE IOHEXOL 350 MG/ML SOLN COMPARISON:  Multiple priors, most recently chest CTA 11/04/2020. FINDINGS: Cardiovascular: No filling defects within the pulmonary arterial tree to suggest pulmonary embolism. Heart size is normal. There is no significant pericardial  fluid, thickening or pericardial calcification. Aortic atherosclerosis. No definite coronary artery calcifications. Right single-lumen power porta cath with tip terminating in the right atrium. Mediastinum/Nodes: No pathologically enlarged mediastinal or right hilar lymph nodes. Prominent soft tissue in the left hilar region, similar to prior studies. Esophagus is unremarkable in appearance. No axillary lymphadenopathy. Lungs/Pleura: Increasingly bulky mass-like area of architectural distortion in the left lung, most evident in the central aspect of the left upper lobe which has clearly progressed when compared to prior examinations dating back to 07/17/2020, with increasingly convex margins, concerning for locally recurrent disease. This is contiguous with bulky soft tissue in the left hilar region. Trace left pleural effusion lying dependently. Right lung is clear. No right pleural effusion. Upper Abdomen: 1.1 cm low-attenuation lesion in the upper pole the left kidney is compatible with a simple cyst. Musculoskeletal: There are no aggressive appearing lytic or blastic lesions noted in the visualized portions of the skeleton. Review of the MIP images confirms the above findings. IMPRESSION: 1. No evidence of pulmonary embolism. 2. Increasingly bulky mass-like architectural distortion throughout the left lung, most evident in the central aspect of the left upper lobe where there are increasingly convex margins, concerning for locally recurrent disease. Further evaluation with PET-CT is recommended in the near future to better evaluate these findings. 3. Trace left pleural effusion lying dependently. Electronically Signed   By: Vinnie Langton M.D.   On: 11/13/2020 15:56   CT Angio Chest PE W and/or Wo Contrast  Result Date: 11/04/2020 CLINICAL DATA:  Acute shortness of breath, dyspnea, recently COVID positive 10/20/2020 EXAM: CT ANGIOGRAPHY CHEST WITH CONTRAST TECHNIQUE: Multidetector CT imaging of the chest  was performed using the standard protocol during bolus administration of intravenous contrast. Multiplanar CT image reconstructions and MIPs were obtained to evaluate the vascular anatomy. CONTRAST:  25mL OMNIPAQUE IOHEXOL 350 MG/ML SOLN COMPARISON:  10/20/2020 FINDINGS: Cardiovascular: No significant central or proximal hilar pulmonary artery filling defect or acute pulmonary embolus. Limited assessment of the smaller peripheral segmental branches particularly in the lower lobes with respiratory motion artifact. No significant interval change compared to 10/20/2020. Minor aortic atherosclerosis. Negative for aneurysm or dissection. No mediastinal hemorrhage  or hematoma. Patent 2 vessel arch anatomy. Central venous structures appear patent.  No veno-occlusive process. Right IJ power port catheter noted. Mild cardiac enlargement. No pericardial effusion. Mediastinum/Nodes: Trachea and central airways are patent. Slight air distention of the esophagus, nonspecific. No hiatal hernia. No bulky adenopathy. Lungs/Pleura: Stable left hilar/bronchovascular volume loss and consolidation with similar areas of peripheral left upper lobe, lingula and right lower lobe bronchovascular opacities. Findings are favored to be post treatment changes. No new acute airspace process, edema, or CHF. No pleural abnormality, effusion or pneumothorax. Upper Abdomen: No acute upper abdominal abnormality. Stable hypodense well-circumscribed pancreas cystic lesion in the pancreas body measuring 14 mm. Please refer to the prior abdominal CTs. No associated ductal dilatation or surrounding inflammatory process. Stable left upper pole hypodense renal cyst. Musculoskeletal: Degenerative changes noted of the spine. No chest wall soft tissue abnormality or asymmetry. Intact thoracic spine. No compression fracture. Sternum intact. Review of the MIP images confirms the above findings. IMPRESSION: No significant acute central or proximal hilar pulmonary  embolus by CTA. Limited assessment of the peripheral segmental branches because contrast opacification and motion artifact. No significant interval change compared to 10/20/2020. Stable left lung perihilar consolidation and bandlike areas of scarring compatible with post treatment changes. No superimposed acute airspace process or new consolidation. Stable cardiomegaly without CHF Aortic Atherosclerosis (ICD10-I70.0). Electronically Signed   By: Jerilynn Mages.  Shick M.D.   On: 11/04/2020 11:01   ECHOCARDIOGRAM COMPLETE  Result Date: 11/11/2020    ECHOCARDIOGRAM REPORT   Patient Name:   Mitchell Frye Date of Exam: 11/11/2020 Medical Rec #:  [id number]     Height:       70.0 in Accession #:    [id number]    Weight:       174.0 lb Date of Birth:  [birth year]      BSA:          1.967 m Patient Age:    1 years      BP:           130/70 mmHg Patient Gender: M             HR:           104 bpm. Exam Location:  [facility] Procedure: 2D Echo, Cardiac Doppler, Color Doppler and Strain Analysis Indications:     Dyspnea R06.00  History:         Patient has prior history of Echocardiogram examinations, most                  recent 01/13/2020. Signs/Symptoms:Dyspnea; Risk                  Factors:Hypertension.  Sonographer:     Sherrie Sport Referring Phys:  [id number] [facility] C RAO Diagnosing Phys: Kate Sable MD IMPRESSIONS  1. Left ventricular ejection fraction, by estimation, is 55 to 60%. The left ventricle has normal function. The left ventricle has no regional wall motion abnormalities. Left ventricular diastolic parameters are consistent with Grade I diastolic dysfunction (impaired relaxation).  2. Right ventricular systolic function is normal. The right ventricular size is normal.  3. The mitral valve is normal in structure. No evidence of mitral valve regurgitation.  4. The aortic valve is tricuspid. Aortic valve regurgitation is mild. Mild aortic valve sclerosis is present, with no evidence of aortic valve stenosis. FINDINGS  Left  Ventricle: Left ventricular ejection fraction, by estimation, is 55 to 60%. The left ventricle has normal function. The left ventricle has no regional  wall motion abnormalities. Global longitudinal strain performed but not reported based on interpreter judgement due to suboptimal tracking. 3D left ventricular ejection fraction analysis performed but not reported based on interpreter judgement due to suboptimal quality. The left ventricular internal cavity size was normal in size. There is no left ventricular hypertrophy. Left ventricular diastolic parameters are consistent with Grade I diastolic dysfunction (impaired relaxation). Right Ventricle: The right ventricular size is normal. No increase in right ventricular wall thickness. Right ventricular systolic function is normal. Left Atrium: Left atrial size was normal in size. Right Atrium: Right atrial size was normal in size. Pericardium: There is no evidence of pericardial effusion. Mitral Valve: The mitral valve is normal in structure. No evidence of mitral valve regurgitation. Tricuspid Valve: The tricuspid valve is normal in structure. Tricuspid valve regurgitation is not demonstrated. Aortic Valve: The aortic valve is tricuspid. Aortic valve regurgitation is mild. Mild aortic valve sclerosis is present, with no evidence of aortic valve stenosis. Aortic valve mean gradient measures 3.0 mmHg. Aortic valve peak gradient measures 5.1 mmHg. Aortic valve area, by VTI measures 3.44 cm. Pulmonic Valve: The pulmonic valve was normal in structure. Pulmonic valve regurgitation is not visualized. Aorta: The aortic root is normal in size and structure. Venous: The inferior vena cava was not well visualized. IAS/Shunts: No atrial level shunt detected by color flow Doppler.  LEFT VENTRICLE PLAX 2D LVIDd:         3.80 cm  Diastology LVIDs:         2.40 cm  LV e' medial:    5.00 cm/s LV PW:         1.00 cm  LV E/e' medial:  10.9 LV IVS:        1.10 cm  LV e' lateral:   5.00  cm/s LVOT diam:     2.10 cm  LV E/e' lateral: 10.9 LV SV:         46 LV SV Index:   23 LVOT Area:     3.46 cm                          3D Volume EF:                         3D EF:        52 %                         LV EDV:       202 ml                         LV ESV:       98 ml                         LV SV:        104 ml RIGHT VENTRICLE RV Basal diam:  2.30 cm RV S prime:     15.70 cm/s TAPSE (M-mode): 4.1 cm LEFT ATRIUM             Index       RIGHT ATRIUM           Index LA diam:        3.40 cm 1.73 cm/m  RA Area:     15.70 cm LA Vol (A2C):   42.7 ml 21.70 ml/m RA  Volume:   34.90 ml  17.74 ml/m LA Vol (A4C):   23.7 ml 12.05 ml/m LA Biplane Vol: 32.4 ml 16.47 ml/m  AORTIC VALVE                   PULMONIC VALVE AV Area (Vmax):    2.76 cm    PV Vmax:        0.59 m/s AV Area (Vmean):   2.59 cm    PV Peak grad:   1.4 mmHg AV Area (VTI):     3.44 cm    RVOT Peak grad: 3 mmHg AV Vmax:           113.00 cm/s AV Vmean:          78.600 cm/s AV VTI:            0.134 m AV Peak Grad:      5.1 mmHg AV Mean Grad:      3.0 mmHg LVOT Vmax:         90.10 cm/s LVOT Vmean:        58.800 cm/s LVOT VTI:          0.133 m LVOT/AV VTI ratio: 0.99  AORTA Ao Root diam: 3.60 cm MITRAL VALVE                TRICUSPID VALVE MV Area (PHT): 3.91 cm     TR Peak grad:   14.6 mmHg MV Decel Time: 194 msec     TR Vmax:        191.00 cm/s MV E velocity: 54.40 cm/s MV A velocity: 101.00 cm/s  SHUNTS MV E/A ratio:  0.54         Systemic VTI:  0.13 m                             Systemic Diam: 2.10 cm Kate Sable MD Electronically signed by Kate Sable MD Signature Date/Time: 11/11/2020/5:54:58 PM    Final     PERFORMANCE STATUS (ECOG) : 3 - Symptomatic, >50% confined to bed  Review of Systems Unless otherwise noted, a complete review of systems is negative.  Physical Exam General: NAD Pulmonary: Unlabored Extremities: no edema, no joint deformities Skin: no rashes Neurological: Weakness but otherwise  nonfocal  IMPRESSION: Patient reports that pain was improved yesterday but worsened overnight.  He currently endorses 7 out of 10 nonradiating pain in the chest improved with use of IV morphine.  MAR reviewed.  It appears that patient has not really received consistent dosing of pain medications overnight.  Yesterday, he was receiving frequent dosing of oxycodone and intermittently required IV morphine.  Gabapentin was ordered but patient has not received first dose of this yet.  Discussed pain management options with him and will plan to liberalize MS Contin to every 8 hour dosing in hopes that this will provide him with longer acting control.  RN is currently giving patient gabapentin and we will also see if this helps.  Additionally, XRT is planned and hopefully that will help stabilize pain after a few treatments.  Patient endorses constipation.  Likely opioid induced.  We will liberalize bowel regimen with senna/MiraLAX.  Patient has limited support at home.  He describes a fall in the past but did not have anyone available to help pick him up.  He will likely need home health services available at time of discharge.  PLAN: -Continue current scope of treatment -Increase MS Contin  15 mg every 8 hours -Continue oxycodone as needed for breakthrough pain -Try to maximize oral pain medications in anticipation of discharge -Liberalize bowel regimen with senna/MiraLAX -Will follow  Case and plan discussed with Dr. Janese Banks   Patient expressed understanding and was in agreement with this plan. He also understands that He can call the clinic at any time with any questions, concerns, or complaints.     Time Total: 30 minutes  Visit consisted of counseling and education dealing with the complex and emotionally intense issues of symptom management and palliative care in the setting of serious and potentially life-threatening illness.Greater than 50%  of this time was spent counseling and coordinating  care related to the above assessment and plan.  Signed by: Altha Harm, PhD, NP-C

## 2020-12-02 NOTE — Progress Notes (Signed)
Hematology/Oncology Consult note [facility]  Telephone:([phone] [phone]  Patient Care Team: Center, [facility] as PCP - General Telford Nab, RN as Oncology Nurse Navigator   Name of the patient: Mitchell Frye  [id number]  [birth year]   Date of visit: 12/01/20    Interval history-patient reports that his chest pain is somewhat better but he still reports his pain to be at least a 6 out of 10.  ECOG PS- 1 Pain scale- 6 Opioid associated constipation- no  Review of systems- Review of Systems  Constitutional:  Negative for chills, fever, malaise/fatigue and weight loss.  HENT:  Negative for congestion, ear discharge and nosebleeds.   Eyes:  Negative for blurred vision.  Respiratory:  Negative for cough, hemoptysis, sputum production, shortness of breath and wheezing.   Cardiovascular:  Positive for chest pain. Negative for palpitations, orthopnea and claudication.  Gastrointestinal:  Negative for abdominal pain, blood in stool, constipation, diarrhea, heartburn, melena, nausea and vomiting.  Genitourinary:  Negative for dysuria, flank pain, frequency, hematuria and urgency.  Musculoskeletal:  Negative for back pain, joint pain and myalgias.  Skin:  Negative for rash.  Neurological:  Negative for dizziness, tingling, focal weakness, seizures, weakness and headaches.  Endo/Heme/Allergies:  Does not bruise/bleed easily.  Psychiatric/Behavioral:  Negative for depression and suicidal ideas. The patient does not have insomnia.       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 [facility])      Past Surgical History:  Procedure Laterality Date   PORTA CATH INSERTION N/A 10/22/2019   Procedure: PORTA CATH  INSERTION;  Surgeon: Katha Cabal, MD;  Location: [facility];  Service: Cardiovascular;  Laterality: N/A;   VIDEO BRONCHOSCOPY WITH ENDOBRONCHIAL ULTRASOUND N/A 09/25/2019   Procedure: VIDEO BRONCHOSCOPY WITH ENDOBRONCHIAL ULTRASOUND;  Surgeon: Tyler Pita, MD;  Location: [facility] ORS;  Service: Pulmonary;  Laterality: N/A;    Social History   Socioeconomic History   Marital status: Single    Spouse name: Not on file   Number of children: Not on file   Years of education: Not on file   Highest education level: Not on file  Occupational History   Not on file  Tobacco Use   Smoking status: Former    Packs/day: 1.00    Years: 20.00    Pack years: 20.00    Types: Cigarettes    Quit date: 40    Years since quitting: 28.8   Smokeless tobacco: Never  Vaping Use   Vaping Use: Never used  Substance and Sexual Activity   Alcohol use: Not Currently    Comment: not drank any beer in 2 onths   Drug use: Never   Sexual activity: Not on file  Other Topics Concern   Not on file  Social History Narrative   Not on file   Social Determinants of Health   Financial Resource Strain: Not on file  Food Insecurity: Not on file  Transportation Needs: Not on file  Physical Activity: Not on file  Stress: Not on file  Social Connections: Not on file  Intimate Partner Violence: Not on file    Family History  Problem Relation Age of Onset   Cancer  Brother      Current Facility-Administered Medications:    albuterol (PROVENTIL) (2.5 MG/3ML) 0.083% nebulizer solution 2.5 mg, 2.5 mg, Inhalation, Q4H PRN, Agbata, Tochukwu, MD   alum & mag hydroxide-simeth (MAALOX/MYLANTA) 200-200-20 MG/5ML suspension 15 mL, 15 mL, Oral, Q4H PRN, Wouk, Ailene Rud, MD, 15 mL at 12/02/20 0255   benzonatate (TESSALON) capsule 100 mg, 100 mg, Oral, Q6H PRN, Agbata, Tochukwu, MD   enoxaparin (LOVENOX) injection 40 mg, 40 mg, Subcutaneous, Q24H, Agbata, Tochukwu, MD, 40 mg at 12/01/20 2204    umeclidinium bromide (INCRUSE ELLIPTA) 62.5 MCG/INH 1 puff, 1 puff, Inhalation, Daily, 1 puff at 12/02/20 0806 **AND** fluticasone furoate-vilanterol (BREO ELLIPTA) 200-25 MCG/INH 1 puff, 1 puff, Inhalation, Daily, Belue, Alver Sorrow, RPH, 1 puff at 12/02/20 0806   morphine (MS CONTIN) 12 hr tablet 15 mg, 15 mg, Oral, Q12H, Sindy Guadeloupe, MD, 15 mg at 12/01/20 2205   morphine 2 MG/ML injection 2 mg, 2 mg, Intravenous, Q4H PRN, Agbata, Tochukwu, MD, 2 mg at 12/02/20 0804   ondansetron (ZOFRAN) tablet 4 mg, 4 mg, Oral, Q6H PRN **OR** ondansetron (ZOFRAN) injection 4 mg, 4 mg, Intravenous, Q6H PRN, Agbata, Tochukwu, MD   oxyCODONE (Oxy IR/ROXICODONE) immediate release tablet 20 mg, 20 mg, Oral, Q4H PRN, Sindy Guadeloupe, MD, 20 mg at 12/01/20 1735   pantoprazole (PROTONIX) EC tablet 40 mg, 40 mg, Oral, BID AC, Wouk, Ailene Rud, MD, 40 mg at 12/02/20 0802   polyethylene glycol (MIRALAX / GLYCOLAX) packet 17 g, 17 g, Oral, Daily, Wouk, Ailene Rud, MD, 17 g at 12/02/20 0802   senna (SENOKOT) tablet 8.6 mg, 1 tablet, Oral, Daily, Wouk, Ailene Rud, MD, 8.6 mg at 12/02/20 0802   sodium chloride flush (NS) 0.9 % injection 3 mL, 3 mL, Intravenous, Q12H, Agbata, Tochukwu, MD, 3 mL at 12/01/20 2208   sucralfate (CARAFATE) tablet 1 g, 1 g, Oral, QID, Agbata, Tochukwu, MD, 1 g at 12/02/20 0802  Physical exam:  Vitals:   12/01/20 1420 12/01/20 1611 12/01/20 2022 12/02/20 0636  BP: 114/80 124/83 125/69 122/61  Pulse: 89 89 (!) 110 91  Resp: (!) 23 17 18 18   Temp:  99.5 F (37.5 C) 98.6 F (37 C) 99.1 F (37.3 C)  TempSrc:  Oral Oral Oral  SpO2: 95% 94% 93% 94%  Weight:      Height:       Physical Exam Constitutional:      Comments: Appears in some distress because of ongoing pain  Cardiovascular:     Rate and Rhythm: Regular rhythm. Tachycardia present.     Heart sounds: Normal heart sounds.  Pulmonary:     Effort: Pulmonary effort is normal.     Breath sounds: Normal breath sounds.  Abdominal:      General: Bowel sounds are normal.     Palpations: Abdomen is soft.  Skin:    General: Skin is warm and dry.  Neurological:     Mental Status: He is alert and oriented to person, place, and time.     CMP Latest Ref Rng & Units 12/02/2020  Glucose 70 - 99 mg/dL 123(H)  BUN 8 - 23 mg/dL 16  Creatinine 0.61 - 1.24 mg/dL 0.77  Sodium 135 - 145 mmol/L 136  Potassium 3.5 - 5.1 mmol/L 3.8  Chloride 98 - 111 mmol/L 101  CO2 22 - 32 mmol/L 26  Calcium 8.9 - 10.3 mg/dL 9.0  Total Protein 6.5 - 8.1 g/dL -  Total Bilirubin 0.3 - 1.2 mg/dL -  Alkaline Phos 38 - 126 U/L -  AST 15 - 41 U/L -  ALT 0 - 44 U/L -   CBC Latest Ref Rng & Units 12/01/2020  WBC 4.0 - 10.5 K/uL 6.8  Hemoglobin 13.0 - 17.0 g/dL 10.0(L)  Hematocrit 39.0 - 52.0 % 30.0(L)  Platelets 150 - 400 K/uL 496(H)    @IMAGES @  DG Chest 2 View  Result Date: 11/30/2020 CLINICAL DATA:  Chest pain, shortness of breath, chills EXAM: CHEST - 2 VIEW COMPARISON:  Chest radiograph and CTA chest 11/26/2010 FINDINGS: Chest wall port is stable in position. The cardiomediastinal silhouette is stable. Patchy opacity projecting over the left mid lung is unchanged. There is no new or progressive airspace disease. There is no pleural effusion or pneumothorax. The bones are stable. IMPRESSION: Unchanged masslike opacity projecting over the left midlung. No new or progressive airspace disease Electronically Signed   By: Valetta Mole M.D.   On: 11/30/2020 10:19   DG Chest 2 View  Result Date: 11/25/2020 CLINICAL DATA:  Chest pain EXAM: CHEST - 2 VIEW COMPARISON:  11/13/2020 FINDINGS: Masslike consolidation within the left lung base is again seen and appears stable since prior examination. Right lung is clear. No pneumothorax or pleural effusion. Right internal jugular chest port is seen with its tip at the superior right atrium. Cardiac size within normal limits. No acute bone abnormality. IMPRESSION: Stable masslike consolidation within the left  lower lobe. Electronically Signed   By: Fidela Salisbury M.D.   On: 11/25/2020 01:29   DG Chest 2 View  Result Date: 11/13/2020 CLINICAL DATA:  A 70 year old male presents with chest pain in the setting of ongoing systemic therapy for pulmonary neoplasm. EXAM: CHEST - 2 VIEW COMPARISON:  October 30, 2020. FINDINGS: RIGHT Port-A-Cath in-situ, tip at the upper portion of the RIGHT atrium as on previous imaging. Stable LEFT perihilar masslike area with no new areas of consolidation. No visible pneumothorax. No sign of pleural effusion. On limited assessment no acute skeletal process. IMPRESSION: Stable chest x-ray. No acute changes with unchanged appearance of LEFT hilar distortion and masslike appearance. Electronically Signed   By: Zetta Bills M.D.   On: 11/13/2020 14:06   CT Angio Chest PE W and/or Wo Contrast  Result Date: 11/30/2020 CLINICAL DATA:  Chest pain, shortness of breath EXAM: CT ANGIOGRAPHY CHEST WITH CONTRAST TECHNIQUE: Multidetector CT imaging of the chest was performed using the standard protocol during bolus administration of intravenous contrast. Multiplanar CT image reconstructions and MIPs were obtained to evaluate the vascular anatomy. CONTRAST:  24mL OMNIPAQUE IOHEXOL 350 MG/ML SOLN COMPARISON:  11/25/2020 FINDINGS: Cardiovascular: Right IJ port catheter extends to right atrium. Heart size upper limits normal. Good contrast opacification of the pulmonary arterial tree. No definite acute PE, with significant image degradation through the lung bases secondary to confirm continued patient breathing motion during the study. Good contrast opacification of the thoracic aorta without dissection, aneurysm, or stenosis. Bovine variant brachiocephalic arterial origin anatomy without proximal stenosis. No significant atheromatous change. Mediastinum/Nodes: 1 cm pretracheal lymph node. Subcarinal adenopathy stable. 6.6 cm left hilar mass involving and occluding branches of the left pulmonary  veins, abutting upper lower lobe bronchi, with central air bronchograms. A Lungs/Pleura: Left hilar mass extending into the inferior lingula and left lower lobe. Right lung clear. Some image degradation secondary to continued breathing during the acquisition. Upper Abdomen: Renal cysts.  No acute findings. Musculoskeletal: No chest wall abnormality. No acute or significant osseous findings. Review of the MIP images  confirms the above findings. IMPRESSION: 1. Negative for acute PE or thoracic aortic dissection. 2. Persistent left hilar masslike process involving branches of the left pulmonary veins and extending to involve the inferior lingula and left lower lobe, suspicious for neoplasm, as has been previously described 3. No evidence of distal metastatic disease. Electronically Signed   By: Lucrezia Europe M.D.   On: 11/30/2020 12:40   CT Angio Chest PE W and/or Wo Contrast  Result Date: 11/25/2020 CLINICAL DATA:  Three-month history of chest pain. History of lung cancer. EXAM: CT ANGIOGRAPHY CHEST WITH CONTRAST TECHNIQUE: Multidetector CT imaging of the chest was performed using the standard protocol during bolus administration of intravenous contrast. Multiplanar CT image reconstructions and MIPs were obtained to evaluate the vascular anatomy. CONTRAST:  23mL OMNIPAQUE IOHEXOL 350 MG/ML SOLN COMPARISON:  11/13/2020 FINDINGS: Cardiovascular: The heart is mildly enlarged but stable. No pericardial effusion. Mild tortuosity of the thoracic aorta but no aneurysm or dissection. Minimal atherosclerotic calcification at the aortic arch. The branch vessels are normal. No obvious coronary artery calcifications. The pulmonary arterial tree is fairly well opacified. No findings suspicious for pulmonary embolism. Extensive tumor surrounding the left pulmonary arteries. Mediastinum/Nodes: Persistent ill-defined soft tissue density involving the left hilar region and subcarinal space likely infiltrating tumor. Small stable  right-sided hilar lymph nodes. Lungs/Pleura: Persistent and slightly progressive ill-defined soft tissue density involving the left hilum and left paramediastinal lung surrounding the left mainstem bronchus and left pulmonary artery consistent with recurrent tumor. There is also surrounding interstitial thickening and vague nodularity the left upper lobe and left lower lobe which could be obstructive pneumonitis or interstitial spread of tumor. The right lung remains relatively clear except for streaky right basilar atelectasis. No pulmonary lesions. No pleural effusions. Upper Abdomen: No significant upper abdominal findings. No hepatic or adrenal gland lesions. No upper abdominal adenopathy. Bilateral renal cysts are noted. Musculoskeletal: No significant bony findings. Review of the MIP images confirms the above findings. IMPRESSION: 1. No CT findings for pulmonary embolism. 2. Persistent and slightly progressive ill-defined soft tissue density involving the left hilum and left paramediastinal lung most consistent with recurrent tumor. 3. Surrounding interstitial thickening and vague nodularity in the left upper lobe and left lower lobe could be obstructive pneumonitis or interstitial spread of tumor. 4. Left hilar and subcarinal adenopathy. 5. No findings for upper abdominal metastatic disease. Electronically Signed   By: Marijo Sanes M.D.   On: 11/25/2020 05:26   CT Angio Chest PE W and/or Wo Contrast  Result Date: 11/13/2020 CLINICAL DATA:  70 year old male with history of chest pain and shortness of breath. History of lung cancer status post chemotherapy and radiation therapy (complete 10 months ago). Recent diagnosis of COVID infection 10/20/2020. EXAM: CT ANGIOGRAPHY CHEST WITH CONTRAST TECHNIQUE: Multidetector CT imaging of the chest was performed using the standard protocol during bolus administration of intravenous contrast. Multiplanar CT image reconstructions and MIPs were obtained to evaluate the  vascular anatomy. CONTRAST:  43mL OMNIPAQUE IOHEXOL 350 MG/ML SOLN COMPARISON:  Multiple priors, most recently chest CTA 11/04/2020. FINDINGS: Cardiovascular: No filling defects within the pulmonary arterial tree to suggest pulmonary embolism. Heart size is normal. There is no significant pericardial fluid, thickening or pericardial calcification. Aortic atherosclerosis. No definite coronary artery calcifications. Right single-lumen power porta cath with tip terminating in the right atrium. Mediastinum/Nodes: No pathologically enlarged mediastinal or right hilar lymph nodes. Prominent soft tissue in the left hilar region, similar to prior studies. Esophagus is unremarkable in appearance. No  axillary lymphadenopathy. Lungs/Pleura: Increasingly bulky mass-like area of architectural distortion in the left lung, most evident in the central aspect of the left upper lobe which has clearly progressed when compared to prior examinations dating back to 07/17/2020, with increasingly convex margins, concerning for locally recurrent disease. This is contiguous with bulky soft tissue in the left hilar region. Trace left pleural effusion lying dependently. Right lung is clear. No right pleural effusion. Upper Abdomen: 1.1 cm low-attenuation lesion in the upper pole the left kidney is compatible with a simple cyst. Musculoskeletal: There are no aggressive appearing lytic or blastic lesions noted in the visualized portions of the skeleton. Review of the MIP images confirms the above findings. IMPRESSION: 1. No evidence of pulmonary embolism. 2. Increasingly bulky mass-like architectural distortion throughout the left lung, most evident in the central aspect of the left upper lobe where there are increasingly convex margins, concerning for locally recurrent disease. Further evaluation with PET-CT is recommended in the near future to better evaluate these findings. 3. Trace left pleural effusion lying dependently. Electronically  Signed   By: Vinnie Langton M.D.   On: 11/13/2020 15:56   CT Angio Chest PE W and/or Wo Contrast  Result Date: 11/04/2020 CLINICAL DATA:  Acute shortness of breath, dyspnea, recently COVID positive 10/20/2020 EXAM: CT ANGIOGRAPHY CHEST WITH CONTRAST TECHNIQUE: Multidetector CT imaging of the chest was performed using the standard protocol during bolus administration of intravenous contrast. Multiplanar CT image reconstructions and MIPs were obtained to evaluate the vascular anatomy. CONTRAST:  19mL OMNIPAQUE IOHEXOL 350 MG/ML SOLN COMPARISON:  10/20/2020 FINDINGS: Cardiovascular: No significant central or proximal hilar pulmonary artery filling defect or acute pulmonary embolus. Limited assessment of the smaller peripheral segmental branches particularly in the lower lobes with respiratory motion artifact. No significant interval change compared to 10/20/2020. Minor aortic atherosclerosis. Negative for aneurysm or dissection. No mediastinal hemorrhage or hematoma. Patent 2 vessel arch anatomy. Central venous structures appear patent.  No veno-occlusive process. Right IJ power port catheter noted. Mild cardiac enlargement. No pericardial effusion. Mediastinum/Nodes: Trachea and central airways are patent. Slight air distention of the esophagus, nonspecific. No hiatal hernia. No bulky adenopathy. Lungs/Pleura: Stable left hilar/bronchovascular volume loss and consolidation with similar areas of peripheral left upper lobe, lingula and right lower lobe bronchovascular opacities. Findings are favored to be post treatment changes. No new acute airspace process, edema, or CHF. No pleural abnormality, effusion or pneumothorax. Upper Abdomen: No acute upper abdominal abnormality. Stable hypodense well-circumscribed pancreas cystic lesion in the pancreas body measuring 14 mm. Please refer to the prior abdominal CTs. No associated ductal dilatation or surrounding inflammatory process. Stable left upper pole hypodense  renal cyst. Musculoskeletal: Degenerative changes noted of the spine. No chest wall soft tissue abnormality or asymmetry. Intact thoracic spine. No compression fracture. Sternum intact. Review of the MIP images confirms the above findings. IMPRESSION: No significant acute central or proximal hilar pulmonary embolus by CTA. Limited assessment of the peripheral segmental branches because contrast opacification and motion artifact. No significant interval change compared to 10/20/2020. Stable left lung perihilar consolidation and bandlike areas of scarring compatible with post treatment changes. No superimposed acute airspace process or new consolidation. Stable cardiomegaly without CHF Aortic Atherosclerosis (ICD10-I70.0). Electronically Signed   By: Jerilynn Mages.  Shick M.D.   On: 11/04/2020 11:01   ECHOCARDIOGRAM COMPLETE  Result Date: 11/11/2020    ECHOCARDIOGRAM REPORT   Patient Name:   KASSIUS BATTISTE Date of Exam: 11/11/2020 Medical Rec #:  [id number]     Height:  70.0 in Accession #:    [id number]    Weight:       174.0 lb Date of Birth:  30-Jan-1951      BSA:          1.967 m Patient Age:    50 years      BP:           130/70 mmHg Patient Gender: M             HR:           104 bpm. Exam Location:  [facility] Procedure: 2D Echo, Cardiac Doppler, Color Doppler and Strain Analysis Indications:     Dyspnea R06.00  History:         Patient has prior history of Echocardiogram examinations, most                  recent 01/13/2020. Signs/Symptoms:Dyspnea; Risk                  Factors:Hypertension.  Sonographer:     Sherrie Sport Referring Phys:  [id number] [facility] C Hipolito Martinezlopez Diagnosing Phys: Kate Sable MD IMPRESSIONS  1. Left ventricular ejection fraction, by estimation, is 55 to 60%. The left ventricle has normal function. The left ventricle has no regional wall motion abnormalities. Left ventricular diastolic parameters are consistent with Grade I diastolic dysfunction (impaired relaxation).  2. Right ventricular systolic function  is normal. The right ventricular size is normal.  3. The mitral valve is normal in structure. No evidence of mitral valve regurgitation.  4. The aortic valve is tricuspid. Aortic valve regurgitation is mild. Mild aortic valve sclerosis is present, with no evidence of aortic valve stenosis. FINDINGS  Left Ventricle: Left ventricular ejection fraction, by estimation, is 55 to 60%. The left ventricle has normal function. The left ventricle has no regional wall motion abnormalities. Global longitudinal strain performed but not reported based on interpreter judgement due to suboptimal tracking. 3D left ventricular ejection fraction analysis performed but not reported based on interpreter judgement due to suboptimal quality. The left ventricular internal cavity size was normal in size. There is no left ventricular hypertrophy. Left ventricular diastolic parameters are consistent with Grade I diastolic dysfunction (impaired relaxation). Right Ventricle: The right ventricular size is normal. No increase in right ventricular wall thickness. Right ventricular systolic function is normal. Left Atrium: Left atrial size was normal in size. Right Atrium: Right atrial size was normal in size. Pericardium: There is no evidence of pericardial effusion. Mitral Valve: The mitral valve is normal in structure. No evidence of mitral valve regurgitation. Tricuspid Valve: The tricuspid valve is normal in structure. Tricuspid valve regurgitation is not demonstrated. Aortic Valve: The aortic valve is tricuspid. Aortic valve regurgitation is mild. Mild aortic valve sclerosis is present, with no evidence of aortic valve stenosis. Aortic valve mean gradient measures 3.0 mmHg. Aortic valve peak gradient measures 5.1 mmHg. Aortic valve area, by VTI measures 3.44 cm. Pulmonic Valve: The pulmonic valve was normal in structure. Pulmonic valve regurgitation is not visualized. Aorta: The aortic root is normal in size and structure. Venous: The  inferior vena cava was not well visualized. IAS/Shunts: No atrial level shunt detected by color flow Doppler.  LEFT VENTRICLE PLAX 2D LVIDd:         3.80 cm  Diastology LVIDs:         2.40 cm  LV e' medial:    5.00 cm/s LV PW:         1.00 cm  LV E/e' medial:  10.9 LV IVS:        1.10 cm  LV e' lateral:   5.00 cm/s LVOT diam:     2.10 cm  LV E/e' lateral: 10.9 LV SV:         46 LV SV Index:   23 LVOT Area:     3.46 cm                          3D Volume EF:                         3D EF:        52 %                         LV EDV:       202 ml                         LV ESV:       98 ml                         LV SV:        104 ml RIGHT VENTRICLE RV Basal diam:  2.30 cm RV S prime:     15.70 cm/s TAPSE (M-mode): 4.1 cm LEFT ATRIUM             Index       RIGHT ATRIUM           Index LA diam:        3.40 cm 1.73 cm/m  RA Area:     15.70 cm LA Vol (A2C):   42.7 ml 21.70 ml/m RA Volume:   34.90 ml  17.74 ml/m LA Vol (A4C):   23.7 ml 12.05 ml/m LA Biplane Vol: 32.4 ml 16.47 ml/m  AORTIC VALVE                   PULMONIC VALVE AV Area (Vmax):    2.76 cm    PV Vmax:        0.59 m/s AV Area (Vmean):   2.59 cm    PV Peak grad:   1.4 mmHg AV Area (VTI):     3.44 cm    RVOT Peak grad: 3 mmHg AV Vmax:           113.00 cm/s AV Vmean:          78.600 cm/s AV VTI:            0.134 m AV Peak Grad:      5.1 mmHg AV Mean Grad:      3.0 mmHg LVOT Vmax:         90.10 cm/s LVOT Vmean:        58.800 cm/s LVOT VTI:          0.133 m LVOT/AV VTI ratio: 0.99  AORTA Ao Root diam: 3.60 cm MITRAL VALVE                TRICUSPID VALVE MV Area (PHT): 3.91 cm     TR Peak grad:   14.6 mmHg MV Decel Time: 194 msec     TR Vmax:        191.00 cm/s MV E velocity: 54.40 cm/s MV A velocity: 101.00 cm/s  SHUNTS MV E/A ratio:  0.54  Systemic VTI:  0.13 m                             Systemic Diam: 2.10 cm Kate Sable MD Electronically signed by Kate Sable MD Signature Date/Time: 11/11/2020/5:54:58 PM    Final      Assessment  and plan- Patient is a 70 y.o. 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  Patient was also evaluated by GI today and underwent upper endoscopy which did not show any evidence of esophagitis.  2 nonbleeding angioectasias were noted cardiac work-up recently has been negative.  Most recent CT scan does show evidence of 6.6 cm left hilar mass involving and occluding branches of the left pulmonary veins and lower lobe bronchi.  I had started the patient yesterday on morphine long-acting as well as doubled up his oxycodone to 20 mg every 4 hours as needed.  We will also add gabapentin to his pain regimen.  Palliative care will also see the patient tomorrow.  I have asked radiation oncology to see the patient to consider palliative radiation ASAP and they have evaluated him as well.  Patient will likely start radiation treatment this week  Disposition: Patient can continue to receive outpatient radiation treatment and can be discharged as long as pain is well controlled.  If pain remains uncontrolled tomorrow it would be okay to increase the dose of morphine to 30 mg twice daily.   Visit Diagnosis 1. Other chest pain   2. Neoplasm related pain      Dr. Randa Evens, MD, MPH [facility] at [facility] [phone] 12/02/2020 8:37 AM

## 2020-12-02 NOTE — Progress Notes (Signed)
PROGRESS NOTE    Mitchell Frye  OZD:[id number] DOB: [birth year] DOA: 11/30/2020 PCP: [facility]   Chief complaint.  Chest pain. Brief Narrative:  Mitchell Frye is a 70 y.o. male with medical history significant for hypertension, dyslipidemia, history of adenocarcinoma of the lung status post concurrent chemoradiation therapy and currently on MAB which he receives every 4 weeks, history of radiation esophagitis and hypertension.  He presently emergency room with severe chest pain.  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  Chest pain was due to lung cancer.  He was started on radiation therapy as well as pain medicine.   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 Right hilar mass secondary to lung cancer. Patient chest pain appears to be secondary to lung cancer. Patient currently receiving radiation therapy, also on pain medicine with scheduled MS Contin and as needed oxycodone. Will continue current treatment, might be able to discharge tomorrow if pain is better controlled.  Chronic dysphagia. Stable.     DVT prophylaxis: Lovenox Code Status: full Family Communication:  Disposition Plan:    Status is: Inpatient  Remains inpatient appropriate because: Severe chest pain requiring pain control.  Patient also preferred to go home, however, sister will not be back until next week.  I will obtain PT/OT to see patient can live independently.        I/O last 3 completed shifts: In: 200 [I.V.:200] Out: 300 [Urine:300] No intake/output data recorded.     Consultants:  Oncology  Procedures: Radiation therapy.  Antimicrobials: None  Subjective: Patient still complaining signal chest pain intermittently.  No  short of breath. Able to eat. No abdominal pain or nausea vomiting. No dysuria hematuria No fever or chills.  Objective: Vitals:   12/01/20 2022 12/02/20 0636 12/02/20 0816 12/02/20 1140  BP: 125/69 122/61 122/79 124/70  Pulse: (!) 110 91 88 95  Resp: 18 18 18 18   Temp: 98.6 F (37 C) 99.1 F (37.3 C) 98.1 F (36.7 C) 98.2 F (36.8 C)  TempSrc: Oral Oral Oral Oral  SpO2: 93% 94% 93% 93%  Weight:      Height:        Intake/Output Summary (Last 24 hours) at 12/02/2020 1253 Last data filed at 12/01/2020 2200 Gross per 24 hour  Intake 200 ml  Output 300 ml  Net -100 ml   Filed Weights   11/30/20 0942  Weight: 77.1 kg    Examination:  General exam: Appears calm and comfortable  Respiratory system: Clear to auscultation. Respiratory effort normal. Cardiovascular system: S1 & S2 heard, RRR. No JVD, murmurs, rubs, gallops or clicks. No pedal edema. Gastrointestinal system: Abdomen is nondistended, soft and nontender. No organomegaly or masses felt. Normal bowel sounds heard. Central nervous system: Alert and oriented. No focal neurological deficits. Extremities: Symmetric 5 x 5 power. Skin: No rashes, lesions or ulcers Psychiatry: Judgement and insight appear normal. Mood & affect appropriate.     Data Reviewed: I have personally reviewed following labs and imaging studies  CBC: Recent Labs  Lab 11/30/20 0940 12/01/20 0447  WBC 8.8 6.8  HGB 11.5* 10.0*  HCT 34.8* 30.0*  MCV 77.3* 76.7*  PLT 614* 259*   Basic Metabolic Panel: Recent Labs  Lab 11/30/20 0940 12/01/20  0447 12/02/20 0501  NA 137 135 136  K 3.5 3.5 3.8  CL 104 103 101  CO2 22 25 26   GLUCOSE 155* 117* 123*  BUN 17 12 16   CREATININE 0.82 0.63 0.77  CALCIUM 9.2 8.8* 9.0   GFR: Estimated Creatinine Clearance: 87.4 mL/min (by C-G formula based on SCr of 0.77 mg/dL). Liver Function Tests: No results for input(s): AST, ALT, ALKPHOS, BILITOT, PROT, ALBUMIN in the last 168 hours. No results  for input(s): LIPASE, AMYLASE in the last 168 hours. No results for input(s): AMMONIA in the last 168 hours. Coagulation Profile: No results for input(s): INR, PROTIME in the last 168 hours. Cardiac Enzymes: No results for input(s): CKTOTAL, CKMB, CKMBINDEX, TROPONINI in the last 168 hours. BNP (last 3 results) No results for input(s): PROBNP in the last 8760 hours. HbA1C: No results for input(s): HGBA1C in the last 72 hours. CBG: No results for input(s): GLUCAP in the last 168 hours. Lipid Profile: No results for input(s): CHOL, HDL, LDLCALC, TRIG, CHOLHDL, LDLDIRECT in the last 72 hours. Thyroid Function Tests: No results for input(s): TSH, T4TOTAL, FREET4, T3FREE, THYROIDAB in the last 72 hours. Anemia Panel: No results for input(s): VITAMINB12, FOLATE, FERRITIN, TIBC, IRON, RETICCTPCT in the last 72 hours. Sepsis Labs: No results for input(s): PROCALCITON, LATICACIDVEN in the last 168 hours.  Recent Results (from the past 240 hour(s))  Resp Panel by RT-PCR (Flu A&B, Covid) Nasopharyngeal Swab     Status: None   Collection Time: 11/30/20  1:38 PM   Specimen: Nasopharyngeal Swab; Nasopharyngeal(NP) swabs in vial transport medium  Result Value Ref Range Status   SARS Coronavirus 2 by RT PCR NEGATIVE NEGATIVE Final    Comment: (NOTE) SARS-CoV-2 target nucleic acids are NOT DETECTED.  The SARS-CoV-2 RNA is generally detectable in upper respiratory specimens during the acute phase of infection. The lowest concentration of SARS-CoV-2 viral copies this assay can detect is 138 copies/mL. A negative result does not preclude SARS-Cov-2 infection and should not be used as the sole basis for treatment or other patient management decisions. A negative result may occur with  improper specimen collection/handling, submission of specimen other than nasopharyngeal swab, presence of viral mutation(s) within the areas targeted by this assay, and inadequate number of viral copies(<138  copies/mL). A negative result must be combined with clinical observations, patient history, and epidemiological information. The expected result is Negative.  Fact Sheet for Patients:  EntrepreneurPulse.com.au  Fact Sheet for Healthcare Providers:  IncredibleEmployment.be  This test is no t yet approved or cleared by the [place] FDA and  has been authorized for detection and/or diagnosis of SARS-CoV-2 by FDA under an Emergency Use Authorization (EUA). This EUA will remain  in effect (meaning this test can be used) for the duration of the COVID-19 declaration under Section 564(b)(1) of the Act, 21 U.S.C.section 360bbb-3(b)(1), unless the authorization is terminated  or revoked sooner.       Influenza A by PCR NEGATIVE NEGATIVE Final   Influenza B by PCR NEGATIVE NEGATIVE Final    Comment: (NOTE) The Xpert Xpress SARS-CoV-2/FLU/RSV plus assay is intended as an aid in the diagnosis of influenza from Nasopharyngeal swab specimens and should not be used as a sole basis for treatment. Nasal washings and aspirates are unacceptable for Xpert Xpress SARS-CoV-2/FLU/RSV testing.  Fact Sheet for Patients: EntrepreneurPulse.com.au  Fact Sheet for Healthcare Providers: IncredibleEmployment.be  This test is not yet approved or cleared by the [place] FDA and has been authorized for detection and/or  diagnosis of SARS-CoV-2 by FDA under an Emergency Use Authorization (EUA). This EUA will remain in effect (meaning this test can be used) for the duration of the COVID-19 declaration under Section 564(b)(1) of the Act, 21 U.S.C. section 360bbb-3(b)(1), unless the authorization is terminated or revoked.  Performed at [facility], [address], [place], [place] [address]          Radiology Studies: No results found.      Scheduled Meds:  enoxaparin (LOVENOX) injection  40 mg Subcutaneous  Q24H   umeclidinium bromide  1 puff Inhalation Daily   And   fluticasone furoate-vilanterol  1 puff Inhalation Daily   gabapentin  300 mg Oral BID   morphine  15 mg Oral Q8H   pantoprazole  40 mg Oral BID AC   polyethylene glycol  17 g Oral Daily   senna  1 tablet Oral BID   sodium chloride flush  3 mL Intravenous Q12H   sucralfate  1 g Oral QID   Continuous Infusions:   LOS: 1 day    Time spent: 26 minutes    Sharen Hones, MD [facility]   To contact the attending provider between 7A-7P or the covering provider during after hours 7P-7A, please log into the web site [email or web address] and access using universal [place] password for that web site. If you do not have the password, please call the hospital operator.  12/02/2020, 12:53 PM

## 2020-12-02 NOTE — Progress Notes (Signed)
PT Cancellation Note  Patient Details Name: Mitchell Frye MRN: [id number] DOB: 02-Jul-1950   Cancelled Treatment:    Reason Eval/Treat Not Completed: Patient declined, no reason specified.  Pt order received, chart reviewed, and therapist attempted to see pt.  Pt refused due to current pain levels in chest, noting that when he got up with OT, "it about killed me".  Spoke with nursing and requested to coordinate care with pain meds/therapy.  Will attempt to see pt at later date as medically appropriate.   Gwenlyn Saran, PT, DPT 12/02/20, 5:10 PM

## 2020-12-03 ENCOUNTER — Other Ambulatory Visit: Payer: Medicaid Other

## 2020-12-03 ENCOUNTER — Ambulatory Visit: Payer: Medicare Other

## 2020-12-03 DIAGNOSIS — C3432 Malignant neoplasm of lower lobe, left bronchus or lung: Secondary | ICD-10-CM | POA: Diagnosis not present

## 2020-12-03 DIAGNOSIS — R0789 Other chest pain: Secondary | ICD-10-CM | POA: Diagnosis not present

## 2020-12-03 LAB — BASIC METABOLIC PANEL
Anion gap: 6 (ref 5–15)
BUN: 14 mg/dL (ref 8–23)
CO2: 27 mmol/L (ref 22–32)
Calcium: 8.9 mg/dL (ref 8.9–10.3)
Chloride: 102 mmol/L (ref 98–111)
Creatinine, Ser: 0.73 mg/dL (ref 0.61–1.24)
GFR, Estimated: >60 mL/min (ref >60)
Glucose, Bld: 118 mg/dL — ABNORMAL HIGH (ref 70–99)
Potassium: 3.7 mmol/L (ref 3.5–5.1)
Sodium: 135 mmol/L (ref 135–145)

## 2020-12-03 MED ORDER — LIDOCAINE 5 % EX PTCH
1.0000 | MEDICATED_PATCH | CUTANEOUS | Status: DC
  Administered 2020-12-03 – 2020-12-05 (×3): 1 via TRANSDERMAL
  Filled 2020-12-03 (×3): qty 1

## 2020-12-03 MED ORDER — OXYCODONE HCL 5 MG PO TABS
20.0000 mg | ORAL_TABLET | ORAL | Status: DC | PRN
  Administered 2020-12-03 – 2020-12-05 (×6): 20 mg via ORAL
  Filled 2020-12-03 (×7): qty 4

## 2020-12-03 MED ORDER — LACTULOSE 10 GM/15ML PO SOLN
20.0000 g | Freq: Two times a day (BID) | ORAL | Status: DC
  Administered 2020-12-03 – 2020-12-04 (×3): 20 g via ORAL
  Filled 2020-12-03 (×3): qty 30

## 2020-12-03 NOTE — Telephone Encounter (Signed)
Patient still admitted as of 12/03/20 8:36a

## 2020-12-03 NOTE — Evaluation (Signed)
Physical Therapy Evaluation Patient Details Name: Kamon Fahr MRN: [id number] DOB: Jul 10, 1950 Today's Date: 12/03/2020  History of Present Illness  Patient is a 70 year old male with severe substernal chest pain secondary to metastatic lung cancer, right hilar mass secondary to lung cancer.  Past medical history significant for hypertension, dyslipidemia, history of adenocarcinoma of the lung status post concurrent chemoradiation therapy and currently on MAB which he receives every 4 weeks, history of radiation esophagitis and hypertension  Clinical Impression  Patient appears uncomfortable and anxious on arrival to room. He complains of sternal pain and asked for more pain medication. Patient reports he lives alone and is independent with mobility, however has had sternal pain for 3 months at home.  With sitting up on the side of the bed, patient appears less anxious and reports decreased pain. He was able to stand and take several steps without physical assistance. Overall activity tolerance limited by pain and fatigue with activity. Recommend to continue PT to maximize independence and facilitate return to prior level of function.      Recommendations for follow up therapy are one component of a multi-disciplinary discharge planning process, led by the attending physician.  Recommendations may be updated based on patient status, additional functional criteria and insurance authorization.  Follow Up Recommendations Home health PT    Equipment Recommendations  None recommended by PT    Recommendations for Other Services       Precautions / Restrictions Precautions Precautions: Fall Restrictions Weight Bearing Restrictions: No      Mobility  Bed Mobility Overal bed mobility: Modified Independent             General bed mobility comments: HOB elevated and increased time    Transfers Overall transfer level: Needs assistance Equipment used: None Transfers: Sit to/from  Stand Sit to Stand: Supervision         General transfer comment: supervision for safety. no physical assistance required for transfer from bed. Sp02 91% on room and and heart rate 118bpm in sitting position  Ambulation/Gait Ambulation/Gait assistance: Supervision Gait Distance (Feet): 2 Feet Assistive device: None       General Gait Details: patient able to take several steps along edge of bed with supervision. further mobility self limited due to pain.  Stairs            Wheelchair Mobility    Modified Rankin (Stroke Patients Only)       Balance   Sitting-balance support: Feet supported Sitting balance-Leahy Scale: Good     Standing balance support: During functional activity;No upper extremity supported Standing balance-Leahy Scale: Fair                               Pertinent Vitals/Pain Pain Assessment: 0-10 Pain Score: 9  Pain Location: chest Pain Descriptors / Indicators: Discomfort;Grimacing;Guarding Pain Intervention(s): Limited activity within patient's tolerance;Monitored during session;Patient requesting pain meds-RN notified    Home Living Family/patient expects to be discharged to:: Private residence Living Arrangements: Alone Available Help at Discharge: Family;Available PRN/intermittently Type of Home: House Home Access: Stairs to enter Entrance Stairs-Rails: None Entrance Stairs-Number of Steps: 3 Home Layout: One level Home Equipment: None Additional Comments: patient reports he can drive but does not have a car. family drives to appointments    Prior Function Level of Independence: Independent         Comments: Pt reports multiple falls at home (in the bathtub and rolling out  of the bed) and having chest pain for ~ 3 months     Hand Dominance   Dominant Hand: Right    Extremity/Trunk Assessment   Upper Extremity Assessment Upper Extremity Assessment: Generalized weakness    Lower Extremity Assessment Lower  Extremity Assessment: Generalized weakness       Communication   Communication: Expressive difficulties;Other (comment) (likely due to pain, difficult to  understand that improved as pain decresed with mobility efforts)  Cognition Arousal/Alertness: Awake/alert Behavior During Therapy: Anxious Overall Cognitive Status: Within Functional Limits for tasks assessed                                 General Comments: patient able to follow single step commands with extra time      General Comments General comments (skin integrity, edema, etc.): patient reports decreased pain with activity. patient appears anxious at times with short/shallow breathing. with cues for deep breathing and relaxation techniques, breathing pattern appears improved and speech is less difficult to understand    Exercises     Assessment/Plan    PT Assessment Patient needs continued PT services  PT Problem List Decreased strength;Decreased activity tolerance;Decreased balance;Decreased mobility;Pain;Decreased safety awareness;Decreased knowledge of use of DME       PT Treatment Interventions DME instruction;Gait training;Stair training;Functional mobility training;Therapeutic activities;Therapeutic exercise;Balance training;Neuromuscular re-education;Patient/family education    PT Goals (Current goals can be found in the Care Plan section)  Acute Rehab PT Goals Patient Stated Goal: decreased pain PT Goal Formulation: With patient Time For Goal Achievement: 12/17/20 Potential to Achieve Goals: Fair    Frequency Min 2X/week   Barriers to discharge Decreased caregiver support      Co-evaluation               AM-PAC PT "6 Clicks" Mobility  Outcome Measure Help needed turning from your back to your side while in a flat bed without using bedrails?: None Help needed moving from lying on your back to sitting on the side of a flat bed without using bedrails?: None Help needed moving to and  from a bed to a chair (including a wheelchair)?: A Little Help needed standing up from a chair using your arms (e.g., wheelchair or bedside chair)?: A Little Help needed to walk in hospital room?: A Little Help needed climbing 3-5 steps with a railing? : A Little 6 Click Score: 20    End of Session   Activity Tolerance: Patient limited by pain Patient left: in bed;with call bell/phone within reach;with bed alarm set Nurse Communication: Patient requests pain meds PT Visit Diagnosis: Unsteadiness on feet (R26.81);Muscle weakness (generalized) (M62.81);Pain Pain - Right/Left:  (center) Pain - part of body:  (sternal)    Time: 1017-5102 PT Time Calculation (min) (ACUTE ONLY): 28 min   Charges:   PT Evaluation $PT Eval Moderate Complexity: 1 Mod PT Treatments $Therapeutic Activity: 8-22 mins        Minna Merritts, PT, MPT   Percell Locus 12/03/2020, 11:23 AM

## 2020-12-03 NOTE — Progress Notes (Signed)
PROGRESS NOTE    Mitchell Frye  JME:[id number] DOB: [birth year] DOA: 11/30/2020 PCP: [facility]   Chief complaint chest pain. Brief Narrative:  Mitchell Frye is a 70 y.o. male with medical history significant for hypertension, dyslipidemia, history of adenocarcinoma of the lung status post concurrent chemoradiation therapy and currently on MAB which he receives every 4 weeks, history of radiation esophagitis and hypertension.  He presently emergency room with severe chest pain.  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  Chest pain was due to lung cancer.  He was started on radiation therapy as well as pain medicine.   Assessment & Plan:   Principal Problem:   Chest pain Active Problems:   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)   Radiation esophagitis   Neoplasm related pain   Reactive thrombocytosis   Palliative care encounter  Severe substernal chest pain secondary to metastatic lung cancer. Right hilar mass secondary to lung cancer. Patient still has significant chest pain, continue radiation therapy, patient is on high dose of MS Contin and oxycodone. Not ready for discharge due to severe pain. I will also add Lidoderm patch to the regimen.  Severe constipation. Secondary to pain medicine. Patient has not had a bowel movement for 5 days.  We will add scheduled lactulose twice a day, change to as needed after a large bowel movement.  Chronic dysphagia. Stable.   DVT prophylaxis: Lovenox Code Status: full Family Communication:  Disposition Plan:      Status is: Inpatient   Remains inpatient appropriate because: Severe chest pain requiring pain control.  Patient also preferred to go home, however, sister will not be back until next week.          I/O last 3 completed shifts: In: -  Out: 300 [Urine:300] Total I/O In: 240 [P.O.:240] Out: -      Consultants:   Oncology  Procedures: None  Antimicrobials: None  Subjective: Patient still complaining of severe chest pain, worse intermittently.  No nausea vomiting.  No short of breath. No fever or chills. No dysuria or hematuria.  Objective: Vitals:   12/02/20 2059 12/03/20 0015 12/03/20 0511 12/03/20 0850  BP:  122/60 127/76 120/76  Pulse: 95 95 96 94  Resp: 18 16 16 20   Temp:  99.1 F (37.3 C) 98.7 F (37.1 C) 98.5 F (36.9 C)  TempSrc:  Oral Oral   SpO2: 92% 92% 91% 90%  Weight:      Height:        Intake/Output Summary (Last 24 hours) at 12/03/2020 1054 Last data filed at 12/03/2020 1020 Gross per 24 hour  Intake 240 ml  Output --  Net 240 ml   Filed Weights   11/30/20 0942  Weight: 77.1 kg    Examination:  General exam: Appears calm and comfortable  Respiratory system: Clear to auscultation. Respiratory effort normal. Cardiovascular system: S1 & S2 heard, RRR. No JVD, murmurs, rubs, gallops or clicks. No pedal edema. Gastrointestinal system: Abdomen is nondistended, soft and nontender. No organomegaly or masses felt. Normal bowel sounds heard. Central nervous system: Alert and oriented. No focal neurological deficits. Extremities: Symmetric 5 x 5 power. Skin: No rashes, lesions or ulcers Psychiatry: Judgement and insight appear normal. Mood & affect appropriate.     Data Reviewed: I have personally reviewed following labs and imaging studies  CBC: Recent Labs  Lab 11/30/20 0940 12/01/20 0447  WBC 8.8 6.8  HGB 11.5*  10.0*  HCT 34.8* 30.0*  MCV 77.3* 76.7*  PLT 614* 578*   Basic Metabolic Panel: Recent Labs  Lab 11/30/20 0940 12/01/20 0447 12/02/20 0501 12/03/20 0456  NA 137 135 136 135  K 3.5 3.5 3.8 3.7  CL 104 103 101 102  CO2 22 25 26 27   GLUCOSE 155* 117* 123* 118*  BUN 17 12 16 14   CREATININE 0.82 0.63 0.77 0.73  CALCIUM 9.2 8.8* 9.0 8.9   GFR: Estimated Creatinine Clearance: 87.4 mL/min (by C-G formula based on SCr of 0.73  mg/dL). Liver Function Tests: No results for input(s): AST, ALT, ALKPHOS, BILITOT, PROT, ALBUMIN in the last 168 hours. No results for input(s): LIPASE, AMYLASE in the last 168 hours. No results for input(s): AMMONIA in the last 168 hours. Coagulation Profile: No results for input(s): INR, PROTIME in the last 168 hours. Cardiac Enzymes: No results for input(s): CKTOTAL, CKMB, CKMBINDEX, TROPONINI in the last 168 hours. BNP (last 3 results) No results for input(s): PROBNP in the last 8760 hours. HbA1C: No results for input(s): HGBA1C in the last 72 hours. CBG: No results for input(s): GLUCAP in the last 168 hours. Lipid Profile: No results for input(s): CHOL, HDL, LDLCALC, TRIG, CHOLHDL, LDLDIRECT in the last 72 hours. Thyroid Function Tests: No results for input(s): TSH, T4TOTAL, FREET4, T3FREE, THYROIDAB in the last 72 hours. Anemia Panel: No results for input(s): VITAMINB12, FOLATE, FERRITIN, TIBC, IRON, RETICCTPCT in the last 72 hours. Sepsis Labs: No results for input(s): PROCALCITON, LATICACIDVEN in the last 168 hours.  Recent Results (from the past 240 hour(s))  Resp Panel by RT-PCR (Flu A&B, Covid) Nasopharyngeal Swab     Status: None   Collection Time: 11/30/20  1:38 PM   Specimen: Nasopharyngeal Swab; Nasopharyngeal(NP) swabs in vial transport medium  Result Value Ref Range Status   SARS Coronavirus 2 by RT PCR NEGATIVE NEGATIVE Final    Comment: (NOTE) SARS-CoV-2 target nucleic acids are NOT DETECTED.  The SARS-CoV-2 RNA is generally detectable in upper respiratory specimens during the acute phase of infection. The lowest concentration of SARS-CoV-2 viral copies this assay can detect is 138 copies/mL. A negative result does not preclude SARS-Cov-2 infection and should not be used as the sole basis for treatment or other patient management decisions. A negative result may occur with  improper specimen collection/handling, submission of specimen other than  nasopharyngeal swab, presence of viral mutation(s) within the areas targeted by this assay, and inadequate number of viral copies(<138 copies/mL). A negative result must be combined with clinical observations, patient history, and epidemiological information. The expected result is Negative.  Fact Sheet for Patients:  EntrepreneurPulse.com.au  Fact Sheet for Healthcare Providers:  IncredibleEmployment.be  This test is no t yet approved or cleared by the [place] FDA and  has been authorized for detection and/or diagnosis of SARS-CoV-2 by FDA under an Emergency Use Authorization (EUA). This EUA will remain  in effect (meaning this test can be used) for the duration of the COVID-19 declaration under Section 564(b)(1) of the Act, 21 U.S.C.section 360bbb-3(b)(1), unless the authorization is terminated  or revoked sooner.       Influenza A by PCR NEGATIVE NEGATIVE Final   Influenza B by PCR NEGATIVE NEGATIVE Final    Comment: (NOTE) The Xpert Xpress SARS-CoV-2/FLU/RSV plus assay is intended as an aid in the diagnosis of influenza from Nasopharyngeal swab specimens and should not be used as a sole basis for treatment. Nasal washings and aspirates are unacceptable for Xpert Xpress SARS-CoV-2/FLU/RSV  testing.  Fact Sheet for Patients: EntrepreneurPulse.com.au  Fact Sheet for Healthcare Providers: IncredibleEmployment.be  This test is not yet approved or cleared by the [place] FDA and has been authorized for detection and/or diagnosis of SARS-CoV-2 by FDA under an Emergency Use Authorization (EUA). This EUA will remain in effect (meaning this test can be used) for the duration of the COVID-19 declaration under Section 564(b)(1) of the Act, 21 U.S.C. section 360bbb-3(b)(1), unless the authorization is terminated or revoked.  Performed at [facility], [address] [place], [place]  [address]          Radiology Studies: No results found.      Scheduled Meds:  enoxaparin (LOVENOX) injection  40 mg Subcutaneous Q24H   umeclidinium bromide  1 puff Inhalation Daily   And   fluticasone furoate-vilanterol  1 puff Inhalation Daily   gabapentin  300 mg Oral BID   lidocaine  1 patch Transdermal Q24H   morphine  15 mg Oral Q8H   pantoprazole  40 mg Oral BID AC   polyethylene glycol  17 g Oral Daily   senna  1 tablet Oral BID   sodium chloride flush  3 mL Intravenous Q12H   sucralfate  1 g Oral QID   Continuous Infusions:   LOS: 2 days    Time spent: 27 minutes    Sharen Hones, MD [facility]   To contact the attending provider between 7A-7P or the covering provider during after hours 7P-7A, please log into the web site [email or web address] and access using universal [place] password for that web site. If you do not have the password, please call the hospital operator.  12/03/2020, 10:54 AM

## 2020-12-03 NOTE — Progress Notes (Signed)
Occupational Therapy Treatment Patient Details Name: Mitchell Frye MRN: [id number] DOB: 11-05-1950 Today's Date: 12/03/2020   History of present illness Patient is a 70 year old male with severe substernal chest pain secondary to metastatic lung cancer, right hilar mass secondary to lung cancer.  Past medical history significant for hypertension, dyslipidemia, history of adenocarcinoma of the lung status post concurrent chemoradiation therapy and currently on MAB which he receives every 4 weeks, history of radiation esophagitis and hypertension   OT comments  Chart reviewed, RN cleared pt for participation in OT tx session. Pt with reported 9/10 pain, agreeable to OT tx session. Pt HR 124 at start of tx session, provided education on relaxation techniques with HR recovering for appropriate activity. Pt participates in LB dressing with MAX A due to increased sternal pain. UB dressing, grooming, bathing completed with MIN A-set up. CGA for STS and supervision for three steps up the bed. Pt is limited by pain, RN reports pt has been receive pain meds. Pt is left as received, all needs met. OT continues to recommend discharge home with [facility], 24/7 assistance.    Recommendations for follow up therapy are one component of a multi-disciplinary discharge planning process, led by the attending physician.  Recommendations may be updated based on patient status, additional functional criteria and insurance authorization.    Follow Up Recommendations  Home health OT;Supervision/Assistance - 24 hour    Equipment Recommendations  3 in 1 bedside commode    Recommendations for Other Services      Precautions / Restrictions Precautions Precautions: Fall Restrictions Weight Bearing Restrictions: No       Mobility Bed Mobility Overal bed mobility: Modified Independent             General bed mobility comments: HOB elevated and increased time    Transfers Overall transfer level: Needs  assistance Equipment used: None Transfers: Sit to/from Stand Sit to Stand: Supervision         General transfer comment: supervision for safety. no physical assistance required for transfer from bed. Sp02 91% on room and and heart rate 118bpm in sitting position    Balance Overall balance assessment: Needs assistance Sitting-balance support: Feet supported Sitting balance-Leahy Scale: Good     Standing balance support: During functional activity;No upper extremity supported Standing balance-Leahy Scale: Fair                             ADL either performed or assessed with clinical judgement   ADL Overall ADL's : Needs assistance/impaired     Grooming: Wash/dry hands;Wash/dry face;Sitting;Set up Grooming Details (indicate cue type and reason): at edge of bed Upper Body Bathing: Sitting;Set up Upper Body Bathing Details (indicate cue type and reason): at edge of bed     Upper Body Dressing : Set up   Lower Body Dressing: Maximal assistance Lower Body Dressing Details (indicate cue type and reason): due to chest pain             Functional mobility during ADLs: Min guard General ADL Comments: increased pain affecting overall ADL completion  on this date      Cognition Arousal/Alertness: Awake/alert Behavior During Therapy: Anxious Overall Cognitive Status: Within Functional Limits for tasks assessed                                 General Comments: patient able to follow single  step commands with extra time        Exercises Other Exercises Other Exercises: education on energy conservation; relaxation techniques      General Comments patient reports decreased pain with activity. patient appears anxious at times with short/shallow breathing. with cues for deep breathing and relaxation techniques, breathing pattern appears improved and speech is less difficult to understand    Pertinent Vitals/ Pain       Pain Assessment: 0-10 Pain  Score: 9  Pain Location: chest Pain Descriptors / Indicators: Discomfort;Grimacing;Guarding Pain Intervention(s): Monitored during session;Limited activity within patient's tolerance;Utilized relaxation techniques;Other (comment) (RN notified)  Home Living Family/patient expects to be discharged to:: Private residence Living Arrangements: Alone Available Help at Discharge: Family;Available PRN/intermittently Type of Home: House Home Access: Stairs to enter [employer] of Steps: 3 Entrance Stairs-Rails: None Home Layout: One level     Bathroom Shower/Tub: Tub/shower unit         Home Equipment: None   Additional Comments: patient reports he can drive but does not have a car. family drives to appointments      Prior Functioning/Environment Level of Independence: Independent        Comments: Pt reports multiple falls at home (in the bathtub and rolling out of the bed) and having chest pain for ~ 3 months   Frequency  Min 2X/week        Progress Toward Goals  OT Goals(current goals can now be found in the care plan section)  Progress towards OT goals: Progressing toward goals  Acute Rehab OT Goals Patient Stated Goal: feel better  Plan Discharge plan remains appropriate    Co-evaluation                 AM-PAC OT "6 Clicks" Daily Activity     Outcome Measure   Help from another person eating meals?: None Help from another person taking care of personal grooming?: None Help from another person toileting, which includes using toliet, bedpan, or urinal?: A Little Help from another person bathing (including washing, rinsing, drying)?: A Little Help from another person to put on and taking off regular upper body clothing?: None Help from another person to put on and taking off regular lower body clothing?: A Little 6 Click Score: 21    End of Session    OT Visit Diagnosis: Unsteadiness on feet (R26.81);Repeated falls (R29.6);Muscle weakness  (generalized) (M62.81);Pain   Activity Tolerance Patient limited by fatigue;Patient limited by pain   Patient Left in bed;with call bell/phone within reach;with bed alarm set   Nurse Communication Mobility status;Patient requests pain meds        Time: 1200-1225 OT Time Calculation (min): 25 min  Charges: OT General Charges $OT Visit: 1 Visit OT Treatments $Self Care/Home Management : 23-37 mins  Shanon Payor, OTD OTR/L  12/03/20, 1:07 PM

## 2020-12-03 NOTE — Progress Notes (Addendum)
Hematology/Oncology Consult note [facility]  [phone] [phone]  Patient Care Team: Center, [facility] as PCP - General Telford Nab, [place] as Oncology Nurse Navigator   Name of the patient: Mitchell Frye  [id number]  January 17, 1951   Date of visit: 12/02/20    Interval history-patient still reports that his chest pain is about 7 out of 10.  He is concerned about going home and living alone in pain.  ECOG PS- 1 Pain scale- 7 Opioid associated constipation- no  Review of systems- Review of Systems  Constitutional:  Positive for malaise/fatigue. Negative for chills, fever and weight loss.  HENT:  Negative for congestion, ear discharge and nosebleeds.   Eyes:  Negative for blurred vision.  Respiratory:  Negative for cough, hemoptysis, sputum production, shortness of breath and wheezing.   Cardiovascular:  Positive for chest pain. Negative for palpitations, orthopnea and claudication.  Gastrointestinal:  Negative for abdominal pain, blood in stool, constipation, diarrhea, heartburn, melena, nausea and vomiting.  Genitourinary:  Negative for dysuria, flank pain, frequency, hematuria and urgency.  Musculoskeletal:  Negative for back pain, joint pain and myalgias.  Skin:  Negative for rash.  Neurological:  Negative for dizziness, tingling, focal weakness, seizures, weakness and headaches.  Endo/Heme/Allergies:  Does not bruise/bleed easily.  Psychiatric/Behavioral:  Negative for depression and suicidal ideas. The patient does not have insomnia.      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 ([place])      Past Surgical History:  Procedure Laterality Date   ESOPHAGOGASTRODUODENOSCOPY (EGD) WITH  PROPOFOL N/A 12/01/2020   Procedure: ESOPHAGOGASTRODUODENOSCOPY (EGD) WITH PROPOFOL;  Surgeon: Lin Landsman, MD;  Location: [facility] ENDOSCOPY;  Service: Gastroenterology;  Laterality: N/A;   PORTA CATH INSERTION N/A 10/22/2019   Procedure: PORTA CATH INSERTION;  Surgeon: Katha Cabal, MD;  Location: [place] [facility];  Service: Cardiovascular;  Laterality: N/A;   VIDEO BRONCHOSCOPY WITH ENDOBRONCHIAL ULTRASOUND N/A 09/25/2019   Procedure: VIDEO BRONCHOSCOPY WITH ENDOBRONCHIAL ULTRASOUND;  Surgeon: Tyler Pita, MD;  Location: [facility] ORS;  Service: Pulmonary;  Laterality: N/A;    Social History   Socioeconomic History   Marital status: Single    Spouse name: Not on file   Number of children: Not on file   Years of education: Not on file   Highest education level: Not on file  Occupational History   Not on file  Tobacco Use   Smoking status: Former    Packs/day: 1.00    Years: 20.00    Pack years: 20.00    Types: Cigarettes    Quit date: 56    Years since quitting: 28.8   Smokeless tobacco: Never  Vaping Use   Vaping Use: Never used  Substance and Sexual Activity   Alcohol use: Not Currently    Comment: not drank any beer in 2 onths   Drug use: Never   Sexual activity: Not on file  Other Topics Concern   Not on file  Social History Narrative   Not on file   Social Determinants of Health   Financial Resource Strain: Not on file  Food Insecurity: Not on file  Transportation Needs: Not on file  Physical Activity: Not on  file  Stress: Not on file  Social Connections: Not on file  Intimate Partner Violence: Not on file    Family History  Problem Relation Age of Onset   Cancer Brother      Current Facility-Administered Medications:    albuterol (PROVENTIL) (2.5 MG/3ML) 0.083% nebulizer solution 2.5 mg, 2.5 mg, Inhalation, Q4H PRN, Agbata, Tochukwu, MD, 2.5 mg at 12/03/20 0806   alum & mag hydroxide-simeth (MAALOX/MYLANTA) 200-200-20 MG/5ML suspension  15 mL, 15 mL, Oral, Q4H PRN, Wouk, Ailene Rud, MD, 15 mL at 12/02/20 0255   benzonatate (TESSALON) capsule 100 mg, 100 mg, Oral, Q6H PRN, Agbata, Tochukwu, MD   enoxaparin (LOVENOX) injection 40 mg, 40 mg, Subcutaneous, Q24H, Agbata, Tochukwu, MD, 40 mg at 12/02/20 2246   umeclidinium bromide (INCRUSE ELLIPTA) 62.5 MCG/INH 1 puff, 1 puff, Inhalation, Daily, 1 puff at 12/03/20 0806 **AND** fluticasone furoate-vilanterol (BREO ELLIPTA) 200-25 MCG/INH 1 puff, 1 puff, Inhalation, Daily, Belue, Alver Sorrow, RPH, 1 puff at 12/03/20 0806   gabapentin (NEURONTIN) capsule 300 mg, 300 mg, Oral, BID, Sindy Guadeloupe, MD, 300 mg at 12/03/20 0803   morphine (MS CONTIN) 12 hr tablet 15 mg, 15 mg, Oral, Q8H, Borders, Kirt Boys, NP, 15 mg at 12/03/20 0620   ondansetron (ZOFRAN) tablet 4 mg, 4 mg, Oral, Q6H PRN **OR** ondansetron (ZOFRAN) injection 4 mg, 4 mg, Intravenous, Q6H PRN, Agbata, Tochukwu, MD   oxyCODONE (Oxy IR/ROXICODONE) immediate release tablet 20 mg, 20 mg, Oral, Q4H PRN, Sindy Guadeloupe, MD, 20 mg at 12/03/20 0803   pantoprazole (PROTONIX) EC tablet 40 mg, 40 mg, Oral, BID AC, Wouk, Ailene Rud, MD, 40 mg at 12/03/20 0803   polyethylene glycol (MIRALAX / GLYCOLAX) packet 17 g, 17 g, Oral, Daily, Wouk, Ailene Rud, MD, 17 g at 12/03/20 0804   senna (SENOKOT) tablet 8.6 mg, 1 tablet, Oral, BID, Borders, Kirt Boys, NP, 8.6 mg at 12/03/20 0804   sodium chloride flush (NS) 0.9 % injection 3 mL, 3 mL, Intravenous, Q12H, Agbata, Tochukwu, MD, 3 mL at 12/03/20 0804   sucralfate (CARAFATE) tablet 1 g, 1 g, Oral, QID, Agbata, Tochukwu, MD, 1 g at 12/03/20 0803  Physical exam:  Vitals:   12/02/20 2036 12/02/20 2059 12/03/20 0015 12/03/20 0511  BP: 123/65  122/60 127/76  Pulse: (!) 101 95 95 96  Resp:  18 16 16   Temp: 99.4 F (37.4 C)  99.1 F (37.3 C) 98.7 F (37.1 C)  TempSrc: Oral  Oral Oral  SpO2: 93% 92% 92% 91%  Weight:      Height:       Physical Exam Constitutional:      General: He is not in  acute distress.    Comments: Appears in mild distress due to pain  Cardiovascular:     Rate and Rhythm: Regular rhythm. Tachycardia present.     Heart sounds: Normal heart sounds.  Pulmonary:     Effort: Pulmonary effort is normal.     Breath sounds: Normal breath sounds.  Abdominal:     General: Bowel sounds are normal.     Palpations: Abdomen is soft.  Skin:    General: Skin is warm and dry.  Neurological:     Mental Status: He is alert and oriented to person, place, and time.     CMP Latest Ref Rng & Units 12/03/2020  Glucose 70 - 99 mg/dL 118(H)  BUN 8 - 23 mg/dL 14  Creatinine 0.61 - 1.24 mg/dL 0.73  Sodium 135 - 145 mmol/L 135  Potassium 3.5 - 5.1 mmol/L 3.7  Chloride 98 - 111 mmol/L 102  CO2 22 - 32 mmol/L 27  Calcium 8.9 - 10.3 mg/dL 8.9  Total Protein 6.5 - 8.1 g/dL -  Total Bilirubin 0.3 - 1.2 mg/dL -  Alkaline Phos 38 - 126 U/L -  AST 15 - 41 U/L -  ALT 0 - 44 U/L -   CBC Latest Ref Rng & Units 12/01/2020  WBC 4.0 - 10.5 K/uL 6.8  Hemoglobin 13.0 - 17.0 g/dL 10.0(L)  Hematocrit 39.0 - 52.0 % 30.0(L)  Platelets 150 - 400 K/uL 496(H)    @IMAGES @  DG Chest 2 View  Result Date: 11/30/2020 CLINICAL DATA:  Chest pain, shortness of breath, chills EXAM: CHEST - 2 VIEW COMPARISON:  Chest radiograph and CTA chest 11/26/2010 FINDINGS: Chest wall port is stable in position. The cardiomediastinal silhouette is stable. Patchy opacity projecting over the left mid lung is unchanged. There is no new or progressive airspace disease. There is no pleural effusion or pneumothorax. The bones are stable. IMPRESSION: Unchanged masslike opacity projecting over the left midlung. No new or progressive airspace disease Electronically Signed   By: Valetta Mole M.D.   On: 11/30/2020 10:19   DG Chest 2 View  Result Date: 11/25/2020 CLINICAL DATA:  Chest pain EXAM: CHEST - 2 VIEW COMPARISON:  11/13/2020 FINDINGS: Masslike consolidation within the left lung base is again seen and appears  stable since prior examination. Right lung is clear. No pneumothorax or pleural effusion. Right internal jugular chest port is seen with its tip at the superior right atrium. Cardiac size within normal limits. No acute bone abnormality. IMPRESSION: Stable masslike consolidation within the left lower lobe. Electronically Signed   By: Fidela Salisbury M.D.   On: 11/25/2020 01:29   DG Chest 2 View  Result Date: 11/13/2020 CLINICAL DATA:  A 70 year old male presents with chest pain in the setting of ongoing systemic therapy for pulmonary neoplasm. EXAM: CHEST - 2 VIEW COMPARISON:  October 30, 2020. FINDINGS: RIGHT Port-A-Cath in-situ, tip at the upper portion of the RIGHT atrium as on previous imaging. Stable LEFT perihilar masslike area with no new areas of consolidation. No visible pneumothorax. No sign of pleural effusion. On limited assessment no acute skeletal process. IMPRESSION: Stable chest x-ray. No acute changes with unchanged appearance of LEFT hilar distortion and masslike appearance. Electronically Signed   By: Zetta Bills M.D.   On: 11/13/2020 14:06   CT Angio Chest PE W and/or Wo Contrast  Result Date: 11/30/2020 CLINICAL DATA:  Chest pain, shortness of breath EXAM: CT ANGIOGRAPHY CHEST WITH CONTRAST TECHNIQUE: Multidetector CT imaging of the chest was performed using the standard protocol during bolus administration of intravenous contrast. Multiplanar CT image reconstructions and MIPs were obtained to evaluate the vascular anatomy. CONTRAST:  59mL OMNIPAQUE IOHEXOL 350 MG/ML SOLN COMPARISON:  11/25/2020 FINDINGS: Cardiovascular: Right IJ port catheter extends to right atrium. Heart size upper limits normal. Good contrast opacification of the pulmonary arterial tree. No definite acute PE, with significant image degradation through the lung bases secondary to confirm continued patient breathing motion during the study. Good contrast opacification of the thoracic aorta without dissection,  aneurysm, or stenosis. Bovine variant brachiocephalic arterial origin anatomy without proximal stenosis. No significant atheromatous change. Mediastinum/Nodes: 1 cm pretracheal lymph node. Subcarinal adenopathy stable. 6.6 cm left hilar mass involving and occluding branches of the left pulmonary veins, abutting upper lower lobe bronchi, with central air bronchograms. A Lungs/Pleura: Left hilar mass extending into  the inferior lingula and left lower lobe. Right lung clear. Some image degradation secondary to continued breathing during the acquisition. Upper Abdomen: Renal cysts.  No acute findings. Musculoskeletal: No chest wall abnormality. No acute or significant osseous findings. Review of the MIP images confirms the above findings. IMPRESSION: 1. Negative for acute PE or thoracic aortic dissection. 2. Persistent left hilar masslike process involving branches of the left pulmonary veins and extending to involve the inferior lingula and left lower lobe, suspicious for neoplasm, as has been previously described 3. No evidence of distal metastatic disease. Electronically Signed   By: Lucrezia Europe M.D.   On: 11/30/2020 12:40   CT Angio Chest PE W and/or Wo Contrast  Result Date: 11/25/2020 CLINICAL DATA:  Three-month history of chest pain. History of lung cancer. EXAM: CT ANGIOGRAPHY CHEST WITH CONTRAST TECHNIQUE: Multidetector CT imaging of the chest was performed using the standard protocol during bolus administration of intravenous contrast. Multiplanar CT image reconstructions and MIPs were obtained to evaluate the vascular anatomy. CONTRAST:  4mL OMNIPAQUE IOHEXOL 350 MG/ML SOLN COMPARISON:  11/13/2020 FINDINGS: Cardiovascular: The heart is mildly enlarged but stable. No pericardial effusion. Mild tortuosity of the thoracic aorta but no aneurysm or dissection. Minimal atherosclerotic calcification at the aortic arch. The branch vessels are normal. No obvious coronary artery calcifications. The pulmonary  arterial tree is fairly well opacified. No findings suspicious for pulmonary embolism. Extensive tumor surrounding the left pulmonary arteries. Mediastinum/Nodes: Persistent ill-defined soft tissue density involving the left hilar region and subcarinal space likely infiltrating tumor. Small stable right-sided hilar lymph nodes. Lungs/Pleura: Persistent and slightly progressive ill-defined soft tissue density involving the left hilum and left paramediastinal lung surrounding the left mainstem bronchus and left pulmonary artery consistent with recurrent tumor. There is also surrounding interstitial thickening and vague nodularity the left upper lobe and left lower lobe which could be obstructive pneumonitis or interstitial spread of tumor. The right lung remains relatively clear except for streaky right basilar atelectasis. No pulmonary lesions. No pleural effusions. Upper Abdomen: No significant upper abdominal findings. No hepatic or adrenal gland lesions. No upper abdominal adenopathy. Bilateral renal cysts are noted. Musculoskeletal: No significant bony findings. Review of the MIP images confirms the above findings. IMPRESSION: 1. No CT findings for pulmonary embolism. 2. Persistent and slightly progressive ill-defined soft tissue density involving the left hilum and left paramediastinal lung most consistent with recurrent tumor. 3. Surrounding interstitial thickening and vague nodularity in the left upper lobe and left lower lobe could be obstructive pneumonitis or interstitial spread of tumor. 4. Left hilar and subcarinal adenopathy. 5. No findings for upper abdominal metastatic disease. Electronically Signed   By: Marijo Sanes M.D.   On: 11/25/2020 05:26   CT Angio Chest PE W and/or Wo Contrast  Result Date: 11/13/2020 CLINICAL DATA:  70 year old male with history of chest pain and shortness of breath. History of lung cancer status post chemotherapy and radiation therapy (complete 10 months ago). Recent  diagnosis of COVID infection 10/20/2020. EXAM: CT ANGIOGRAPHY CHEST WITH CONTRAST TECHNIQUE: Multidetector CT imaging of the chest was performed using the standard protocol during bolus administration of intravenous contrast. Multiplanar CT image reconstructions and MIPs were obtained to evaluate the vascular anatomy. CONTRAST:  52mL OMNIPAQUE IOHEXOL 350 MG/ML SOLN COMPARISON:  Multiple priors, most recently chest CTA 11/04/2020. FINDINGS: Cardiovascular: No filling defects within the pulmonary arterial tree to suggest pulmonary embolism. Heart size is normal. There is no significant pericardial fluid, thickening or pericardial calcification. Aortic atherosclerosis. No  definite coronary artery calcifications. Right single-lumen power porta cath with tip terminating in the right atrium. Mediastinum/Nodes: No pathologically enlarged mediastinal or right hilar lymph nodes. Prominent soft tissue in the left hilar region, similar to prior studies. Esophagus is unremarkable in appearance. No axillary lymphadenopathy. Lungs/Pleura: Increasingly bulky mass-like area of architectural distortion in the left lung, most evident in the central aspect of the left upper lobe which has clearly progressed when compared to prior examinations dating back to 07/17/2020, with increasingly convex margins, concerning for locally recurrent disease. This is contiguous with bulky soft tissue in the left hilar region. Trace left pleural effusion lying dependently. Right lung is clear. No right pleural effusion. Upper Abdomen: 1.1 cm low-attenuation lesion in the upper pole the left kidney is compatible with a simple cyst. Musculoskeletal: There are no aggressive appearing lytic or blastic lesions noted in the visualized portions of the skeleton. Review of the MIP images confirms the above findings. IMPRESSION: 1. No evidence of pulmonary embolism. 2. Increasingly bulky mass-like architectural distortion throughout the left lung, most evident  in the central aspect of the left upper lobe where there are increasingly convex margins, concerning for locally recurrent disease. Further evaluation with PET-CT is recommended in the near future to better evaluate these findings. 3. Trace left pleural effusion lying dependently. Electronically Signed   By: Vinnie Langton M.D.   On: 11/13/2020 15:56   CT Angio Chest PE W and/or Wo Contrast  Result Date: 11/04/2020 CLINICAL DATA:  Acute shortness of breath, dyspnea, recently COVID positive 10/20/2020 EXAM: CT ANGIOGRAPHY CHEST WITH CONTRAST TECHNIQUE: Multidetector CT imaging of the chest was performed using the standard protocol during bolus administration of intravenous contrast. Multiplanar CT image reconstructions and MIPs were obtained to evaluate the vascular anatomy. CONTRAST:  38mL OMNIPAQUE IOHEXOL 350 MG/ML SOLN COMPARISON:  10/20/2020 FINDINGS: Cardiovascular: No significant central or proximal hilar pulmonary artery filling defect or acute pulmonary embolus. Limited assessment of the smaller peripheral segmental branches particularly in the lower lobes with respiratory motion artifact. No significant interval change compared to 10/20/2020. Minor aortic atherosclerosis. Negative for aneurysm or dissection. No mediastinal hemorrhage or hematoma. Patent 2 vessel arch anatomy. Central venous structures appear patent.  No veno-occlusive process. Right IJ power port catheter noted. Mild cardiac enlargement. No pericardial effusion. Mediastinum/Nodes: Trachea and central airways are patent. Slight air distention of the esophagus, nonspecific. No hiatal hernia. No bulky adenopathy. Lungs/Pleura: Stable left hilar/bronchovascular volume loss and consolidation with similar areas of peripheral left upper lobe, lingula and right lower lobe bronchovascular opacities. Findings are favored to be post treatment changes. No new acute airspace process, edema, or CHF. No pleural abnormality, effusion or pneumothorax.  Upper Abdomen: No acute upper abdominal abnormality. Stable hypodense well-circumscribed pancreas cystic lesion in the pancreas body measuring 14 mm. Please refer to the prior abdominal CTs. No associated ductal dilatation or surrounding inflammatory process. Stable left upper pole hypodense renal cyst. Musculoskeletal: Degenerative changes noted of the spine. No chest wall soft tissue abnormality or asymmetry. Intact thoracic spine. No compression fracture. Sternum intact. Review of the MIP images confirms the above findings. IMPRESSION: No significant acute central or proximal hilar pulmonary embolus by CTA. Limited assessment of the peripheral segmental branches because contrast opacification and motion artifact. No significant interval change compared to 10/20/2020. Stable left lung perihilar consolidation and bandlike areas of scarring compatible with post treatment changes. No superimposed acute airspace process or new consolidation. Stable cardiomegaly without CHF Aortic Atherosclerosis (ICD10-I70.0). Electronically Signed   By:  M.  Shick M.D.   On: 11/04/2020 11:01   ECHOCARDIOGRAM COMPLETE  Result Date: 11/11/2020    ECHOCARDIOGRAM REPORT   Patient Name:   NIGEL ERICSSON Date of Exam: 11/11/2020 Medical Rec #:  [id number]     Height:       70.0 in Accession #:    [id number]    Weight:       174.0 lb Date of Birth:  Jul 10, 1950      BSA:          1.967 m Patient Age:    38 years      BP:           130/70 mmHg Patient Gender: M             HR:           104 bpm. Exam Location:  [facility] Procedure: 2D Echo, Cardiac Doppler, Color Doppler and Strain Analysis Indications:     Dyspnea R06.00  History:         Patient has prior history of Echocardiogram examinations, most                  recent 01/13/2020. Signs/Symptoms:Dyspnea; Risk                  Factors:Hypertension.  Sonographer:     Sherrie Sport Referring Phys:  [id number] [facility] C Polly Barner Diagnosing Phys: Kate Sable MD IMPRESSIONS  1. Left ventricular ejection  fraction, by estimation, is 55 to 60%. The left ventricle has normal function. The left ventricle has no regional wall motion abnormalities. Left ventricular diastolic parameters are consistent with Grade I diastolic dysfunction (impaired relaxation).  2. Right ventricular systolic function is normal. The right ventricular size is normal.  3. The mitral valve is normal in structure. No evidence of mitral valve regurgitation.  4. The aortic valve is tricuspid. Aortic valve regurgitation is mild. Mild aortic valve sclerosis is present, with no evidence of aortic valve stenosis. FINDINGS  Left Ventricle: Left ventricular ejection fraction, by estimation, is 55 to 60%. The left ventricle has normal function. The left ventricle has no regional wall motion abnormalities. Global longitudinal strain performed but not reported based on interpreter judgement due to suboptimal tracking. 3D left ventricular ejection fraction analysis performed but not reported based on interpreter judgement due to suboptimal quality. The left ventricular internal cavity size was normal in size. There is no left ventricular hypertrophy. Left ventricular diastolic parameters are consistent with Grade I diastolic dysfunction (impaired relaxation). Right Ventricle: The right ventricular size is normal. No increase in right ventricular wall thickness. Right ventricular systolic function is normal. Left Atrium: Left atrial size was normal in size. Right Atrium: Right atrial size was normal in size. Pericardium: There is no evidence of pericardial effusion. Mitral Valve: The mitral valve is normal in structure. No evidence of mitral valve regurgitation. Tricuspid Valve: The tricuspid valve is normal in structure. Tricuspid valve regurgitation is not demonstrated. Aortic Valve: The aortic valve is tricuspid. Aortic valve regurgitation is mild. Mild aortic valve sclerosis is present, with no evidence of aortic valve stenosis. Aortic valve mean gradient  measures 3.0 mmHg. Aortic valve peak gradient measures 5.1 mmHg. Aortic valve area, by VTI measures 3.44 cm. Pulmonic Valve: The pulmonic valve was normal in structure. Pulmonic valve regurgitation is not visualized. Aorta: The aortic root is normal in size and structure. Venous: The inferior vena cava was not well visualized. IAS/Shunts: No atrial level shunt detected by color flow Doppler.  LEFT VENTRICLE PLAX 2D LVIDd:         3.80 cm  Diastology LVIDs:         2.40 cm  LV e' medial:    5.00 cm/s LV PW:         1.00 cm  LV E/e' medial:  10.9 LV IVS:        1.10 cm  LV e' lateral:   5.00 cm/s LVOT diam:     2.10 cm  LV E/e' lateral: 10.9 LV SV:         46 LV SV Index:   23 LVOT Area:     3.46 cm                          3D Volume EF:                         3D EF:        52 %                         LV EDV:       202 ml                         LV ESV:       98 ml                         LV SV:        104 ml RIGHT VENTRICLE RV Basal diam:  2.30 cm RV S prime:     15.70 cm/s TAPSE (M-mode): 4.1 cm LEFT ATRIUM             Index       RIGHT ATRIUM           Index LA diam:        3.40 cm 1.73 cm/m  RA Area:     15.70 cm LA Vol (A2C):   42.7 ml 21.70 ml/m RA Volume:   34.90 ml  17.74 ml/m LA Vol (A4C):   23.7 ml 12.05 ml/m LA Biplane Vol: 32.4 ml 16.47 ml/m  AORTIC VALVE                   PULMONIC VALVE AV Area (Vmax):    2.76 cm    PV Vmax:        0.59 m/s AV Area (Vmean):   2.59 cm    PV Peak grad:   1.4 mmHg AV Area (VTI):     3.44 cm    RVOT Peak grad: 3 mmHg AV Vmax:           113.00 cm/s AV Vmean:          78.600 cm/s AV VTI:            0.134 m AV Peak Grad:      5.1 mmHg AV Mean Grad:      3.0 mmHg LVOT Vmax:         90.10 cm/s LVOT Vmean:        58.800 cm/s LVOT VTI:          0.133 m LVOT/AV VTI ratio: 0.99  AORTA Ao Root diam: 3.60 cm MITRAL VALVE                TRICUSPID VALVE MV Area (PHT): 3.91  cm     TR Peak grad:   14.6 mmHg MV Decel Time: 194 msec     TR Vmax:        191.00 cm/s MV E velocity:  54.40 cm/s MV A velocity: 101.00 cm/s  SHUNTS MV E/A ratio:  0.54         Systemic VTI:  0.13 m                             Systemic Diam: 2.10 cm Kate Sable MD Electronically signed by Kate Sable MD Signature Date/Time: 11/11/2020/5:54:58 PM    Final      Assessment and plan- Patient is a 71 y.o. 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  Cardiac work-up has been negative in the past.  Patient also underwent endoscopy which showed nonbleeding angioectasias but no evidence of esophagitis.  He was also seen by palliative care today and morphine long-acting medicine has been increased from every 12 to Q8.  He is also on as needed oxycodone at double his home dose at 20 mg every 4 hours as needed.  Gabapentin has also been added.  Palliative radiation to be started this week.  I again explained to the patient that it is going to take a couple of weeks with his pain to get under better control and radiation can be done as an outpatient.  I will update patient's sister as well who lives in [place]    Visit Diagnosis 1. Other chest pain   2. Neoplasm related pain   3. Constipation, unspecified constipation type      Dr. Randa Evens, MD, MPH [facility] [phone] 12/03/2020 8:27 AM

## 2020-12-03 NOTE — Progress Notes (Signed)
Tumor Board Documentation  Tarek Cravens was presented by Dr Janese Banks at our Tumor Board on 12/03/2020, which included representatives from medical oncology, radiation oncology, internal medicine, navigation, pathology, radiology, surgical, genetics, research, pulmonology.  Mitchell Frye currently presents as a current patient, for discussion, for progression with history of the following treatments: active survellience, adjuvant chemotherapy, immunotherapy.  Additionally, we reviewed previous medical and familial history, history of present illness, and recent lab results along with all available histopathologic and imaging studies. The tumor board considered available treatment options and made the following recommendations: Palliative radiation therapy, Additional screening (PET scan) Plan is to change pt systemic therapy back to previous chemotherapy  The following procedures/referrals were also placed: No orders of the defined types were placed in this encounter.   Clinical Trial Status: not discussed   Staging used: AJCC Stage Group AJCC Staging: T: c2 N: c3 M: 0 Group: [name] B LLL Lung Cancer   National site-specific guidelines NCCN were discussed with respect to the case.  Tumor board is a meeting of clinicians from various specialty areas who evaluate and discuss patients for whom a multidisciplinary approach is being considered. Final determinations in the plan of care are those of the provider(s). The responsibility for follow up of recommendations given during tumor board is that of the provider.   Today's extended care, comprehensive team conference, Mitchell Frye was not present for the discussion and was not examined.   Multidisciplinary Tumor Board is a multidisciplinary case peer review process.  Decisions discussed in the Multidisciplinary Tumor Board reflect the opinions of the specialists present at the conference without having examined the patient.  Ultimately, treatment and  diagnostic decisions rest with the primary provider(s) and the patient.

## 2020-12-04 ENCOUNTER — Encounter: Payer: Self-pay | Admitting: Oncology

## 2020-12-04 ENCOUNTER — Ambulatory Visit: Payer: Medicare Other

## 2020-12-04 DIAGNOSIS — G893 Neoplasm related pain (acute) (chronic): Secondary | ICD-10-CM | POA: Diagnosis not present

## 2020-12-04 DIAGNOSIS — R0789 Other chest pain: Secondary | ICD-10-CM | POA: Diagnosis not present

## 2020-12-04 DIAGNOSIS — C3432 Malignant neoplasm of lower lobe, left bronchus or lung: Secondary | ICD-10-CM | POA: Diagnosis not present

## 2020-12-04 DIAGNOSIS — Z515 Encounter for palliative care: Secondary | ICD-10-CM | POA: Diagnosis not present

## 2020-12-04 LAB — BASIC METABOLIC PANEL
Anion gap: 5 (ref 5–15)
BUN: 13 mg/dL (ref 8–23)
CO2: 29 mmol/L (ref 22–32)
Calcium: 8.8 mg/dL — ABNORMAL LOW (ref 8.9–10.3)
Chloride: 101 mmol/L (ref 98–111)
Creatinine, Ser: 0.57 mg/dL — ABNORMAL LOW (ref 0.61–1.24)
GFR, Estimated: >60 mL/min (ref >60)
Glucose, Bld: 125 mg/dL — ABNORMAL HIGH (ref 70–99)
Potassium: 3.6 mmol/L (ref 3.5–5.1)
Sodium: 135 mmol/L (ref 135–145)

## 2020-12-04 MED ORDER — LORAZEPAM 0.5 MG PO TABS
0.5000 mg | ORAL_TABLET | Freq: Four times a day (QID) | ORAL | Status: DC | PRN
  Administered 2020-12-04 (×2): 0.5 mg via ORAL
  Filled 2020-12-04 (×2): qty 1

## 2020-12-04 MED ORDER — MORPHINE SULFATE ER 15 MG PO TBCR
15.0000 mg | EXTENDED_RELEASE_TABLET | Freq: Once | ORAL | Status: AC
  Administered 2020-12-04: 15 mg via ORAL
  Filled 2020-12-04: qty 1

## 2020-12-04 MED ORDER — NALOXEGOL OXALATE 25 MG PO TABS
25.0000 mg | ORAL_TABLET | Freq: Every day | ORAL | Status: DC
  Administered 2020-12-04 – 2020-12-05 (×2): 25 mg via ORAL
  Filled 2020-12-04 (×2): qty 1

## 2020-12-04 MED ORDER — MORPHINE SULFATE ER 30 MG PO TBCR
30.0000 mg | EXTENDED_RELEASE_TABLET | Freq: Two times a day (BID) | ORAL | Status: DC
  Administered 2020-12-04 – 2020-12-05 (×2): 30 mg via ORAL
  Filled 2020-12-04 (×2): qty 1

## 2020-12-04 NOTE — TOC Initial Note (Signed)
Transition of Care [facility]) - Initial/Assessment Note    Patient Details  Name: Mitchell Frye MRN: [id number] Date of Birth: 1950-06-19  [facility]) CM/SW Contact:    Shelbie Hutching, RN Phone Number: 12/04/2020, 2:51 PM  Clinical Narrative:                 Patient admitted to the hospital with chest pain associated with his lung cancer.  Patient is undergoing radiation in the Cancer center M-F.  RNCM met with patient at the bedside, he endorses significant discomfort with patient, he is on acute O2 at 2L.  Bedside RN is aware of pain and has been giving patient all prescribed medication as directed.   Patient reports that the lives at home mostly by himself but his sister is there sometimes.   He is being followed by palliative care here in the hospital.  Patient agrees with [facility] at discharge, Corene Cornea with [facility] given [facility] referral for PT, OT, and SW.  Patient has Medicaid but not Medicare despite being eligible since he was 22.  He does have a number in his wallet for Medicare that he needs to call and get himself signed up.   TOC will cont to follow- he will need a 3 in 1 and RW at discharge.   Expected Discharge Plan: [place] Barriers to Discharge: Continued Medical Work up   Patient Goals and CMS Choice Patient states their goals for this hospitalization and ongoing recovery are:: Patient wants pain to be under control CMS Medicare.gov Compare Post Acute Care list provided to:: Patient Choice offered to / list presented to : Patient  Expected Discharge Plan and Services Expected Discharge Plan: [place]   Discharge Planning Services: CM Consult Post Acute Care Choice: [place] arrangements for the past 2 months: Single Family Home                           HH Arranged: PT, OT, Social Work [employer] Agency: [place]) Date [place]: 12/04/20 Time [place]: 1450 Representative  spoke with at [place]: [place] Arrangements/Services Living arrangements for the past 2 months: [place] Lives with:: Siblings Patient language and need for interpreter reviewed:: Yes Do you feel safe going back to the place where you live?: Yes      Need for Family Participation in Patient Care: Yes (Comment) Care giver support system in place?: Yes (comment) (sister)   Criminal Activity/Legal Involvement Pertinent to Current Situation/Hospitalization: No - Comment as needed  Activities of Daily Living Home Assistive Devices/Equipment: Eyeglasses ADL Screening (condition at time of admission) Patient's cognitive ability adequate to safely complete daily activities?: Yes Is the patient deaf or have difficulty hearing?: No Does the patient have difficulty seeing, even when wearing glasses/contacts?: No Does the patient have difficulty concentrating, remembering, or making decisions?: No Patient able to express need for assistance with ADLs?: Yes Does the patient have difficulty dressing or bathing?: No Independently performs ADLs?: Yes (appropriate for developmental age) Does the patient have difficulty walking or climbing stairs?: No Weakness of Legs: None Weakness of Arms/Hands: None  Permission Sought/Granted Permission sought to share information with : Case Manager, Family Supports, Other (comment) Permission granted to share information with : Yes, Verbal Permission Granted  Share Information with NAME: Leonette Monarch  Permission granted to share info w AGENCY: home health  Permission  granted to share info w Relationship: sister  Permission granted to share info w Contact Information: [phone]  Emotional Assessment Appearance:: Appears stated age Attitude/Demeanor/Rapport: Engaged Affect (typically observed): Accepting, Anxious Orientation: : Oriented to Self, Oriented to Place, Oriented to  Time, Oriented to Situation Alcohol / Substance Use:  Not Applicable Psych Involvement: No (comment)  Admission diagnosis:  Other chest pain [R07.89] Chest pain [R07.9] Patient Active Problem List   Diagnosis Date Noted   Reactive thrombocytosis 12/02/2020   Palliative care encounter    Chest pain 11/30/2020   Neoplasm related pain    Chronic dyspnea 11/09/2020   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

## 2020-12-04 NOTE — Progress Notes (Signed)
PET scan scheduled for 10/25. Pt currently admitted. Will continue to follow and reschedule PET scan if pt remains hospitalized.

## 2020-12-04 NOTE — Progress Notes (Signed)
At this time, came into room to give patient tap water enema and patient stated to wait until later on tonight. He feels that his bowels may be moving now and that he would wait until later on for the enema, doesn't feel at this moment he needs it and refused procedure. Educated on why giving enema is important and he stated at this time he doesn't want it. Call bell in reach and bed in low position.

## 2020-12-04 NOTE — Progress Notes (Signed)
Hematology/Oncology Consult note [facility]  [phone] [phone]  Patient Care Team: Center, [facility] as PCP - General Telford Nab, [place] as Oncology Nurse Navigator   Name of the patient: Mitchell Frye  [id number]  [birth year]   Date of visit: 12/04/2020   Interval history-resting comfortably before I entered the room but when he saw me again started complaining of pain.  Appears anxious.  ECOG PS- 1 Pain scale- 6   Review of systems- Review of Systems  Constitutional:  Positive for malaise/fatigue. Negative for chills, fever and weight loss.  HENT:  Negative for congestion, ear discharge and nosebleeds.   Eyes:  Negative for blurred vision.  Respiratory:  Negative for cough, hemoptysis, sputum production, shortness of breath and wheezing.   Cardiovascular:  Positive for chest pain. Negative for palpitations, orthopnea and claudication.  Gastrointestinal:  Negative for abdominal pain, blood in stool, constipation, diarrhea, heartburn, melena, nausea and vomiting.  Genitourinary:  Negative for dysuria, flank pain, frequency, hematuria and urgency.  Musculoskeletal:  Negative for back pain, joint pain and myalgias.  Skin:  Negative for rash.  Neurological:  Negative for dizziness, tingling, focal weakness, seizures, weakness and headaches.  Endo/Heme/Allergies:  Does not bruise/bleed easily.  Psychiatric/Behavioral:  Negative for depression and suicidal ideas. The patient does not have insomnia.       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 ([place])      Past Surgical History:  Procedure Laterality Date   ESOPHAGOGASTRODUODENOSCOPY (EGD) WITH PROPOFOL N/A 12/01/2020   Procedure:  ESOPHAGOGASTRODUODENOSCOPY (EGD) WITH PROPOFOL;  Surgeon: Lin Landsman, MD;  Location: [facility] ENDOSCOPY;  Service: Gastroenterology;  Laterality: N/A;   PORTA CATH INSERTION N/A 10/22/2019   Procedure: PORTA CATH INSERTION;  Surgeon: Katha Cabal, MD;  Location: [place] [facility];  Service: Cardiovascular;  Laterality: N/A;   VIDEO BRONCHOSCOPY WITH ENDOBRONCHIAL ULTRASOUND N/A 09/25/2019   Procedure: VIDEO BRONCHOSCOPY WITH ENDOBRONCHIAL ULTRASOUND;  Surgeon: Tyler Pita, MD;  Location: [facility] ORS;  Service: Pulmonary;  Laterality: N/A;    Social History   Socioeconomic History   Marital status: Single    Spouse name: Not on file   Number of children: Not on file   Years of education: Not on file   Highest education level: Not on file  Occupational History   Not on file  Tobacco Use   Smoking status: Former    Packs/day: 1.00    Years: 20.00    Pack years: 20.00    Types: Cigarettes    Quit date: 23    Years since quitting: 28.8   Smokeless tobacco: Never  Vaping Use   Vaping Use: Never used  Substance and Sexual Activity   Alcohol use: Not Currently    Comment: not drank any beer in 2 onths   Drug use: Never   Sexual activity: Not on file  Other Topics Concern   Not on file  Social History Narrative   Not on file   Social Determinants of Health   Financial Resource Strain: Not on file  Food Insecurity: Not on file  Transportation Needs: Not on file  Physical Activity: Not on file  Stress: Not on file  Social  Connections: Not on file  Intimate Partner Violence: Not on file    Family History  Problem Relation Age of Onset   Cancer Brother      Current Facility-Administered Medications:    albuterol (PROVENTIL) (2.5 MG/3ML) 0.083% nebulizer solution 2.5 mg, 2.5 mg, Inhalation, Q4H PRN, Agbata, Tochukwu, MD, 2.5 mg at 12/04/20 0027   alum & mag hydroxide-simeth (MAALOX/MYLANTA) 200-200-20 MG/5ML suspension 15 mL, 15 mL, Oral, Q4H PRN, Wouk,  Ailene Rud, MD, 15 mL at 12/02/20 0255   benzonatate (TESSALON) capsule 100 mg, 100 mg, Oral, Q6H PRN, Agbata, Tochukwu, MD   enoxaparin (LOVENOX) injection 40 mg, 40 mg, Subcutaneous, Q24H, Agbata, Tochukwu, MD, 40 mg at 12/03/20 2208   umeclidinium bromide (INCRUSE ELLIPTA) 62.5 MCG/INH 1 puff, 1 puff, Inhalation, Daily, 1 puff at 12/04/20 0853 **AND** fluticasone furoate-vilanterol (BREO ELLIPTA) 200-25 MCG/INH 1 puff, 1 puff, Inhalation, Daily, Belue, Alver Sorrow, RPH, 1 puff at 12/04/20 0853   gabapentin (NEURONTIN) capsule 300 mg, 300 mg, Oral, BID, Sindy Guadeloupe, MD, 300 mg at 12/04/20 0852   lidocaine (LIDODERM) 5 % 1 patch, 1 patch, Transdermal, Q24H, Sharen Hones, MD, 1 patch at 12/04/20 1111   LORazepam (ATIVAN) tablet 0.5 mg, 0.5 mg, Oral, Q6H PRN, Borders, Kirt Boys, NP, 0.5 mg at 12/04/20 1503   morphine (MS CONTIN) 12 hr tablet 30 mg, 30 mg, Oral, Q12H, Borders, Kirt Boys, NP   naloxegol oxalate (MOVANTIK) tablet 25 mg, 25 mg, Oral, Daily, Sharen Hones, MD, 25 mg at 12/04/20 1503   ondansetron (ZOFRAN) tablet 4 mg, 4 mg, Oral, Q6H PRN **OR** ondansetron (ZOFRAN) injection 4 mg, 4 mg, Intravenous, Q6H PRN, Agbata, Tochukwu, MD   oxyCODONE (Oxy IR/ROXICODONE) immediate release tablet 20 mg, 20 mg, Oral, Q4H PRN, Dorothe Pea, RPH, 20 mg at 12/04/20 1307   pantoprazole (PROTONIX) EC tablet 40 mg, 40 mg, Oral, BID AC, Wouk, Ailene Rud, MD, 40 mg at 12/04/20 1696   sodium chloride flush (NS) 0.9 % injection 3 mL, 3 mL, Intravenous, Q12H, Agbata, Tochukwu, MD, 3 mL at 12/04/20 1019   sucralfate (CARAFATE) tablet 1 g, 1 g, Oral, QID, Agbata, Tochukwu, MD, 1 g at 12/04/20 1503  Physical exam:  Vitals:   12/04/20 0029 12/04/20 0756 12/04/20 1214 12/04/20 1602  BP: 127/73 112/72 135/81 114/76  Pulse: 92 96 (!) 102 (!) 124  Resp: 16 20 18 20   Temp:  98.3 F (36.8 C) 98.7 F (37.1 C) 100 F (37.8 C)  TempSrc:      SpO2: 96% 96% 95% (!) 86%  Weight:      Height:       Physical  Exam Cardiovascular:     Rate and Rhythm: Regular rhythm. Tachycardia present.     Heart sounds: Normal heart sounds.  Pulmonary:     Effort: Pulmonary effort is normal.     Breath sounds: Normal breath sounds.  Abdominal:     General: Bowel sounds are normal.     Palpations: Abdomen is soft.  Skin:    General: Skin is warm and dry.  Neurological:     Mental Status: He is alert and oriented to person, place, and time.     CMP Latest Ref Rng & Units 12/04/2020  Glucose 70 - 99 mg/dL 125(H)  BUN 8 - 23 mg/dL 13  Creatinine 0.61 - 1.24 mg/dL 0.57(L)  Sodium 135 - 145 mmol/L 135  Potassium 3.5 - 5.1 mmol/L 3.6  Chloride 98 - 111 mmol/L 101  CO2 22 -  32 mmol/L 29  Calcium 8.9 - 10.3 mg/dL 8.8(L)  Total Protein 6.5 - 8.1 g/dL -  Total Bilirubin 0.3 - 1.2 mg/dL -  Alkaline Phos 38 - 126 U/L -  AST 15 - 41 U/L -  ALT 0 - 44 U/L -   CBC Latest Ref Rng & Units 12/01/2020  WBC 4.0 - 10.5 K/uL 6.8  Hemoglobin 13.0 - 17.0 g/dL 10.0(L)  Hematocrit 39.0 - 52.0 % 30.0(L)  Platelets 150 - 400 K/uL 496(H)    @IMAGES @  DG Chest 2 View  Result Date: 11/30/2020 CLINICAL DATA:  Chest pain, shortness of breath, chills EXAM: CHEST - 2 VIEW COMPARISON:  Chest radiograph and CTA chest 11/26/2010 FINDINGS: Chest wall port is stable in position. The cardiomediastinal silhouette is stable. Patchy opacity projecting over the left mid lung is unchanged. There is no new or progressive airspace disease. There is no pleural effusion or pneumothorax. The bones are stable. IMPRESSION: Unchanged masslike opacity projecting over the left midlung. No new or progressive airspace disease Electronically Signed   By: Valetta Mole M.D.   On: 11/30/2020 10:19   DG Chest 2 View  Result Date: 11/25/2020 CLINICAL DATA:  Chest pain EXAM: CHEST - 2 VIEW COMPARISON:  11/13/2020 FINDINGS: Masslike consolidation within the left lung base is again seen and appears stable since prior examination. Right lung is clear. No  pneumothorax or pleural effusion. Right internal jugular chest port is seen with its tip at the superior right atrium. Cardiac size within normal limits. No acute bone abnormality. IMPRESSION: Stable masslike consolidation within the left lower lobe. Electronically Signed   By: Fidela Salisbury M.D.   On: 11/25/2020 01:29   DG Chest 2 View  Result Date: 11/13/2020 CLINICAL DATA:  A 70 year old male presents with chest pain in the setting of ongoing systemic therapy for pulmonary neoplasm. EXAM: CHEST - 2 VIEW COMPARISON:  October 30, 2020. FINDINGS: RIGHT Port-A-Cath in-situ, tip at the upper portion of the RIGHT atrium as on previous imaging. Stable LEFT perihilar masslike area with no new areas of consolidation. No visible pneumothorax. No sign of pleural effusion. On limited assessment no acute skeletal process. IMPRESSION: Stable chest x-ray. No acute changes with unchanged appearance of LEFT hilar distortion and masslike appearance. Electronically Signed   By: Zetta Bills M.D.   On: 11/13/2020 14:06   CT Angio Chest PE W and/or Wo Contrast  Result Date: 11/30/2020 CLINICAL DATA:  Chest pain, shortness of breath EXAM: CT ANGIOGRAPHY CHEST WITH CONTRAST TECHNIQUE: Multidetector CT imaging of the chest was performed using the standard protocol during bolus administration of intravenous contrast. Multiplanar CT image reconstructions and MIPs were obtained to evaluate the vascular anatomy. CONTRAST:  33mL OMNIPAQUE IOHEXOL 350 MG/ML SOLN COMPARISON:  11/25/2020 FINDINGS: Cardiovascular: Right IJ port catheter extends to right atrium. Heart size upper limits normal. Good contrast opacification of the pulmonary arterial tree. No definite acute PE, with significant image degradation through the lung bases secondary to confirm continued patient breathing motion during the study. Good contrast opacification of the thoracic aorta without dissection, aneurysm, or stenosis. Bovine variant brachiocephalic  arterial origin anatomy without proximal stenosis. No significant atheromatous change. Mediastinum/Nodes: 1 cm pretracheal lymph node. Subcarinal adenopathy stable. 6.6 cm left hilar mass involving and occluding branches of the left pulmonary veins, abutting upper lower lobe bronchi, with central air bronchograms. A Lungs/Pleura: Left hilar mass extending into the inferior lingula and left lower lobe. Right lung clear. Some image degradation secondary to continued breathing  during the acquisition. Upper Abdomen: Renal cysts.  No acute findings. Musculoskeletal: No chest wall abnormality. No acute or significant osseous findings. Review of the MIP images confirms the above findings. IMPRESSION: 1. Negative for acute PE or thoracic aortic dissection. 2. Persistent left hilar masslike process involving branches of the left pulmonary veins and extending to involve the inferior lingula and left lower lobe, suspicious for neoplasm, as has been previously described 3. No evidence of distal metastatic disease. Electronically Signed   By: Lucrezia Europe M.D.   On: 11/30/2020 12:40   CT Angio Chest PE W and/or Wo Contrast  Result Date: 11/25/2020 CLINICAL DATA:  Three-month history of chest pain. History of lung cancer. EXAM: CT ANGIOGRAPHY CHEST WITH CONTRAST TECHNIQUE: Multidetector CT imaging of the chest was performed using the standard protocol during bolus administration of intravenous contrast. Multiplanar CT image reconstructions and MIPs were obtained to evaluate the vascular anatomy. CONTRAST:  58mL OMNIPAQUE IOHEXOL 350 MG/ML SOLN COMPARISON:  11/13/2020 FINDINGS: Cardiovascular: The heart is mildly enlarged but stable. No pericardial effusion. Mild tortuosity of the thoracic aorta but no aneurysm or dissection. Minimal atherosclerotic calcification at the aortic arch. The branch vessels are normal. No obvious coronary artery calcifications. The pulmonary arterial tree is fairly well opacified. No findings  suspicious for pulmonary embolism. Extensive tumor surrounding the left pulmonary arteries. Mediastinum/Nodes: Persistent ill-defined soft tissue density involving the left hilar region and subcarinal space likely infiltrating tumor. Small stable right-sided hilar lymph nodes. Lungs/Pleura: Persistent and slightly progressive ill-defined soft tissue density involving the left hilum and left paramediastinal lung surrounding the left mainstem bronchus and left pulmonary artery consistent with recurrent tumor. There is also surrounding interstitial thickening and vague nodularity the left upper lobe and left lower lobe which could be obstructive pneumonitis or interstitial spread of tumor. The right lung remains relatively clear except for streaky right basilar atelectasis. No pulmonary lesions. No pleural effusions. Upper Abdomen: No significant upper abdominal findings. No hepatic or adrenal gland lesions. No upper abdominal adenopathy. Bilateral renal cysts are noted. Musculoskeletal: No significant bony findings. Review of the MIP images confirms the above findings. IMPRESSION: 1. No CT findings for pulmonary embolism. 2. Persistent and slightly progressive ill-defined soft tissue density involving the left hilum and left paramediastinal lung most consistent with recurrent tumor. 3. Surrounding interstitial thickening and vague nodularity in the left upper lobe and left lower lobe could be obstructive pneumonitis or interstitial spread of tumor. 4. Left hilar and subcarinal adenopathy. 5. No findings for upper abdominal metastatic disease. Electronically Signed   By: Marijo Sanes M.D.   On: 11/25/2020 05:26   CT Angio Chest PE W and/or Wo Contrast  Result Date: 11/13/2020 CLINICAL DATA:  70 year old male with history of chest pain and shortness of breath. History of lung cancer status post chemotherapy and radiation therapy (complete 10 months ago). Recent diagnosis of COVID infection 10/20/2020. EXAM: CT  ANGIOGRAPHY CHEST WITH CONTRAST TECHNIQUE: Multidetector CT imaging of the chest was performed using the standard protocol during bolus administration of intravenous contrast. Multiplanar CT image reconstructions and MIPs were obtained to evaluate the vascular anatomy. CONTRAST:  70mL OMNIPAQUE IOHEXOL 350 MG/ML SOLN COMPARISON:  Multiple priors, most recently chest CTA 11/04/2020. FINDINGS: Cardiovascular: No filling defects within the pulmonary arterial tree to suggest pulmonary embolism. Heart size is normal. There is no significant pericardial fluid, thickening or pericardial calcification. Aortic atherosclerosis. No definite coronary artery calcifications. Right single-lumen power porta cath with tip terminating in the right atrium. Mediastinum/Nodes:  No pathologically enlarged mediastinal or right hilar lymph nodes. Prominent soft tissue in the left hilar region, similar to prior studies. Esophagus is unremarkable in appearance. No axillary lymphadenopathy. Lungs/Pleura: Increasingly bulky mass-like area of architectural distortion in the left lung, most evident in the central aspect of the left upper lobe which has clearly progressed when compared to prior examinations dating back to 07/17/2020, with increasingly convex margins, concerning for locally recurrent disease. This is contiguous with bulky soft tissue in the left hilar region. Trace left pleural effusion lying dependently. Right lung is clear. No right pleural effusion. Upper Abdomen: 1.1 cm low-attenuation lesion in the upper pole the left kidney is compatible with a simple cyst. Musculoskeletal: There are no aggressive appearing lytic or blastic lesions noted in the visualized portions of the skeleton. Review of the MIP images confirms the above findings. IMPRESSION: 1. No evidence of pulmonary embolism. 2. Increasingly bulky mass-like architectural distortion throughout the left lung, most evident in the central aspect of the left upper lobe  where there are increasingly convex margins, concerning for locally recurrent disease. Further evaluation with PET-CT is recommended in the near future to better evaluate these findings. 3. Trace left pleural effusion lying dependently. Electronically Signed   By: Vinnie Langton M.D.   On: 11/13/2020 15:56   ECHOCARDIOGRAM COMPLETE  Result Date: 11/11/2020    ECHOCARDIOGRAM REPORT   Patient Name:   DURANT SCIBILIA Date of Exam: 11/11/2020 Medical Rec #:  [id number]     Height:       70.0 in Accession #:    [id number]    Weight:       174.0 lb Date of Birth:  [birth year]      BSA:          1.967 m Patient Age:    69 years      BP:           130/70 mmHg Patient Gender: M             HR:           104 bpm. Exam Location:  [facility] Procedure: 2D Echo, Cardiac Doppler, Color Doppler and Strain Analysis Indications:     Dyspnea R06.00  History:         Patient has prior history of Echocardiogram examinations, most                  recent 01/13/2020. Signs/Symptoms:Dyspnea; Risk                  Factors:Hypertension.  Sonographer:     Sherrie Sport Referring Phys:  [id number] [facility] C Coleman Kalas Diagnosing Phys: Kate Sable MD IMPRESSIONS  1. Left ventricular ejection fraction, by estimation, is 55 to 60%. The left ventricle has normal function. The left ventricle has no regional wall motion abnormalities. Left ventricular diastolic parameters are consistent with Grade I diastolic dysfunction (impaired relaxation).  2. Right ventricular systolic function is normal. The right ventricular size is normal.  3. The mitral valve is normal in structure. No evidence of mitral valve regurgitation.  4. The aortic valve is tricuspid. Aortic valve regurgitation is mild. Mild aortic valve sclerosis is present, with no evidence of aortic valve stenosis. FINDINGS  Left Ventricle: Left ventricular ejection fraction, by estimation, is 55 to 60%. The left ventricle has normal function. The left ventricle has no regional wall motion abnormalities.  Global longitudinal strain performed but not reported based on interpreter judgement due to suboptimal tracking. 3D left ventricular ejection  fraction analysis performed but not reported based on interpreter judgement due to suboptimal quality. The left ventricular internal cavity size was normal in size. There is no left ventricular hypertrophy. Left ventricular diastolic parameters are consistent with Grade I diastolic dysfunction (impaired relaxation). Right Ventricle: The right ventricular size is normal. No increase in right ventricular wall thickness. Right ventricular systolic function is normal. Left Atrium: Left atrial size was normal in size. Right Atrium: Right atrial size was normal in size. Pericardium: There is no evidence of pericardial effusion. Mitral Valve: The mitral valve is normal in structure. No evidence of mitral valve regurgitation. Tricuspid Valve: The tricuspid valve is normal in structure. Tricuspid valve regurgitation is not demonstrated. Aortic Valve: The aortic valve is tricuspid. Aortic valve regurgitation is mild. Mild aortic valve sclerosis is present, with no evidence of aortic valve stenosis. Aortic valve mean gradient measures 3.0 mmHg. Aortic valve peak gradient measures 5.1 mmHg. Aortic valve area, by VTI measures 3.44 cm. Pulmonic Valve: The pulmonic valve was normal in structure. Pulmonic valve regurgitation is not visualized. Aorta: The aortic root is normal in size and structure. Venous: The inferior vena cava was not well visualized. IAS/Shunts: No atrial level shunt detected by color flow Doppler.  LEFT VENTRICLE PLAX 2D LVIDd:         3.80 cm  Diastology LVIDs:         2.40 cm  LV e' medial:    5.00 cm/s LV PW:         1.00 cm  LV E/e' medial:  10.9 LV IVS:        1.10 cm  LV e' lateral:   5.00 cm/s LVOT diam:     2.10 cm  LV E/e' lateral: 10.9 LV SV:         46 LV SV Index:   23 LVOT Area:     3.46 cm                          3D Volume EF:                         3D  EF:        52 %                         LV EDV:       202 ml                         LV ESV:       98 ml                         LV SV:        104 ml RIGHT VENTRICLE RV Basal diam:  2.30 cm RV S prime:     15.70 cm/s TAPSE (M-mode): 4.1 cm LEFT ATRIUM             Index       RIGHT ATRIUM           Index LA diam:        3.40 cm 1.73 cm/m  RA Area:     15.70 cm LA Vol (A2C):   42.7 ml 21.70 ml/m RA Volume:   34.90 ml  17.74 ml/m LA Vol (A4C):   23.7 ml 12.05 ml/m LA Biplane Vol: 32.4 ml  16.47 ml/m  AORTIC VALVE                   PULMONIC VALVE AV Area (Vmax):    2.76 cm    PV Vmax:        0.59 m/s AV Area (Vmean):   2.59 cm    PV Peak grad:   1.4 mmHg AV Area (VTI):     3.44 cm    RVOT Peak grad: 3 mmHg AV Vmax:           113.00 cm/s AV Vmean:          78.600 cm/s AV VTI:            0.134 m AV Peak Grad:      5.1 mmHg AV Mean Grad:      3.0 mmHg LVOT Vmax:         90.10 cm/s LVOT Vmean:        58.800 cm/s LVOT VTI:          0.133 m LVOT/AV VTI ratio: 0.99  AORTA Ao Root diam: 3.60 cm MITRAL VALVE                TRICUSPID VALVE MV Area (PHT): 3.91 cm     TR Peak grad:   14.6 mmHg MV Decel Time: 194 msec     TR Vmax:        191.00 cm/s MV E velocity: 54.40 cm/s MV A velocity: 101.00 cm/s  SHUNTS MV E/A ratio:  0.54         Systemic VTI:  0.13 m                             Systemic Diam: 2.10 cm Kate Sable MD Electronically signed by Kate Sable MD Signature Date/Time: 11/11/2020/5:54:58 PM    Final      Assessment and plan- Patient is a 70 y.o. 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  He is also on oxycodone 20 mg every 4 hours as needed as well as gabapentin.  Patient also appears to be highly anxious and we will add as  needed Ativan.  I also spoke to patient's sister who lives in [place] and explained to both patient and her sister that his pain is likely going to take a few weeks to get control better.  He would require slow titration of his pain medications which can be done as an outpatient and palliative radiation also takes time to work.  He really does not have to stay in the hospital for his pain as he is mainly on oral pain medications.  Patient is concerned that he lives alone and would prefer to stay in the hospital but I again explained to him that that is not an ideal option.  He also needs a PET CT scan which can only be done as an outpatient and is scheduled for next week.  I will plan to switch him to second line chemotherapy as an outpatient   Visit Diagnosis 1. Other chest pain   2. Neoplasm related pain   3. Constipation, unspecified constipation type   4. Palliative care encounter      Dr. Randa Evens,  MD, MPH [place] [facility] [phone] 12/04/2020 4:10 PM

## 2020-12-04 NOTE — Progress Notes (Signed)
[place]  [phone] [phone]   Name: Mitchell Frye Date: 12/04/2020 MRN: [id number]  DOB: Aug 17, 1950  Patient Care Team: Center, [facility] as PCP - Lillia [place], [place], RN as Oncology Nurse Navigator    REASON FOR CONSULTATION: Mitchell Frye is a 70 y.o. 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  Patient is status post concurrent chemoradiation now on maintenance immunotherapy with durvalumab.  Patient was admitted to hospital 11/30/2020 with intractable chest pain.  Work-up including CT of the chest was negative for PE or cardiovascular etiology.  He underwent EGD.  Symptoms are thought secondary to recurrence of hilar malignancy.  He was referred to palliative care to help address goals and manage ongoing symptoms.   .   CODE STATUS: Full code  PAST MEDICAL HISTORY: 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 ([place])     PAST SURGICAL HISTORY:  Past Surgical History:  Procedure Laterality Date   ESOPHAGOGASTRODUODENOSCOPY (EGD) WITH PROPOFOL N/A 12/01/2020   Procedure: ESOPHAGOGASTRODUODENOSCOPY (EGD) WITH PROPOFOL;  Surgeon: Lin Landsman, MD;  Location: [facility] ENDOSCOPY;  Service: Gastroenterology;  Laterality: N/A;   PORTA CATH INSERTION N/A 10/22/2019   Procedure: PORTA CATH INSERTION;  Surgeon: Katha Cabal, MD;  Location: [place] [facility];  Service: Cardiovascular;  Laterality: N/A;   VIDEO BRONCHOSCOPY WITH ENDOBRONCHIAL ULTRASOUND N/A 09/25/2019   Procedure: VIDEO BRONCHOSCOPY WITH ENDOBRONCHIAL ULTRASOUND;  Surgeon: Tyler Pita, MD;  Location: [facility] ORS;  Service: Pulmonary;  Laterality: N/A;    HEMATOLOGY/ONCOLOGY HISTORY:  Oncology History  Malignant  neoplasm of lower lobe of left lung ([place])  10/04/2019 Initial Diagnosis   Malignant neoplasm of lower lobe of left lung ([place])   10/04/2019 Cancer Staging   Staging form: Lung, AJCC 8th Edition - Clinical stage from 10/04/2019: Stage IIIB (cT2b, cN3, cM0) - Signed by Sindy Guadeloupe, MD on 10/04/2019    10/28/2019 - 11/18/2019 Chemotherapy   The patient had dexamethasone (DECADRON) 4 MG tablet, 8 mg, Oral, Daily, 1 of 1 cycle, Start date: 10/04/2019, End date: -- palonosetron (ALOXI) injection 0.25 mg, 0.25 mg, Intravenous,  Once, 4 of 4 cycles Administration: 0.25 mg (10/28/2019), 0.25 mg (11/04/2019), 0.25 mg (11/11/2019), 0.25 mg (11/18/2019) PACLitaxel-protein bound (ABRAXANE) chemo infusion 200 mg, 100 mg/m2 = 200 mg (100 % of original dose 100 mg/m2), Intravenous,  Once, 4 of 4 cycles Dose modification: 100 mg/m2 (original dose 100 mg/m2, Cycle 2), 80 mg/m2 (original dose 100 mg/m2, Cycle 3, Reason: Other (see comments), Comment: neutropenia), 100 mg/m2 (original dose 100 mg/m2, Cycle 1) Administration: 200 mg (11/04/2019), 175 mg (11/11/2019), 200 mg (10/29/2019), 175 mg (11/18/2019) CARBOplatin (PARAPLATIN) 210 mg in sodium chloride 0.9 % 250 mL chemo infusion, 210 mg (100 % of original dose 214.6 mg), Intravenous,  Once, 4 of 4 cycles Dose modification:   (original dose 214.6 mg, Cycle 1) Administration: 210 mg (10/28/2019), 210 mg (11/04/2019), 210 mg (11/11/2019), 210 mg (11/18/2019) PACLitaxel (TAXOL) 90 mg in sodium chloride 0.9 % 250 mL chemo infusion (</= 80mg /m2), 45 mg/m2 = 90 mg, Intravenous,  Once, 1 of 1 cycle Administration: 90 mg (10/28/2019)   for chemotherapy treatment.     11/25/2019 - 11/25/2019 Chemotherapy   The patient had palonosetron (ALOXI) injection 0.25 mg,  0.25 mg, Intravenous,  Once, 1 of 1 cycle Administration: 0.25 mg (11/25/2019) PEMEtrexed (ALIMTA) 1,000 mg in sodium chloride 0.9 % 100 mL chemo infusion, 500 mg/m2 = 1,000 mg, Intravenous,  Once, 1 of 1  cycle Administration: 1,000 mg (11/25/2019) CARBOplatin (PARAPLATIN) 550 mg in sodium chloride 0.9 % 250 mL chemo infusion, 550 mg (100 % of original dose 548 mg), Intravenous,  Once, 1 of 1 cycle Dose modification:   (original dose 548 mg, Cycle 1) Administration: 550 mg (11/25/2019) fosaprepitant (EMEND) 150 mg in sodium chloride 0.9 % 145 mL IVPB, 150 mg, Intravenous,  Once, 1 of 1 cycle Administration: 150 mg (11/25/2019)   for chemotherapy treatment.     01/14/2020 -  Chemotherapy   Patient is on Treatment Plan : LUNG Durvalumab q14d       ALLERGIES:  is allergic to taxol [paclitaxel].  MEDICATIONS:  Current Facility-Administered Medications  Medication Dose Route Frequency Provider Last Rate Last Admin   albuterol (PROVENTIL) (2.5 MG/3ML) 0.083% nebulizer solution 2.5 mg  2.5 mg Inhalation Q4H PRN Agbata, Tochukwu, MD   2.5 mg at 12/04/20 0027   alum & mag hydroxide-simeth (MAALOX/MYLANTA) 200-200-20 MG/5ML suspension 15 mL  15 mL Oral Q4H PRN Gwynne Edinger, MD   15 mL at 12/02/20 0255   benzonatate (TESSALON) capsule 100 mg  100 mg Oral Q6H PRN Agbata, Tochukwu, MD       enoxaparin (LOVENOX) injection 40 mg  40 mg Subcutaneous Q24H Agbata, Tochukwu, MD   40 mg at 12/03/20 2208   umeclidinium bromide (INCRUSE ELLIPTA) 62.5 MCG/INH 1 puff  1 puff Inhalation Daily Renda Rolls, RPH   1 puff at 12/04/20 0853   And   fluticasone furoate-vilanterol (BREO ELLIPTA) 200-25 MCG/INH 1 puff  1 puff Inhalation Daily Renda Rolls, RPH   1 puff at 12/04/20 0853   gabapentin (NEURONTIN) capsule 300 mg  300 mg Oral BID Sindy Guadeloupe, MD   300 mg at 12/04/20 0852   lidocaine (LIDODERM) 5 % 1 patch  1 patch Transdermal Q24H Sharen Hones, MD   1 patch at 12/04/20 1111   LORazepam (ATIVAN) tablet 0.5 mg  0.5 mg Oral Q6H PRN Brylee Berk, Kirt Boys, NP       morphine (MS CONTIN) 12 hr tablet 30 mg  30 mg Oral Q12H Adreanne Yono, Kirt Boys, NP       naloxegol oxalate (MOVANTIK) tablet 25 mg  25 mg  Oral Daily Sharen Hones, MD       ondansetron [facility]) tablet 4 mg  4 mg Oral Q6H PRN Agbata, Tochukwu, MD       Or   ondansetron (ZOFRAN) injection 4 mg  4 mg Intravenous Q6H PRN Agbata, Tochukwu, MD       oxyCODONE (Oxy IR/ROXICODONE) immediate release tablet 20 mg  20 mg Oral Q4H PRN Dorothe Pea, RPH   20 mg at 12/04/20 1307   pantoprazole (PROTONIX) EC tablet 40 mg  40 mg Oral BID AC Wouk, Ailene Rud, MD   40 mg at 12/04/20 5462   sodium chloride flush (NS) 0.9 % injection 3 mL  3 mL Intravenous Q12H Agbata, Tochukwu, MD   3 mL at 12/04/20 1019   sucralfate (CARAFATE) tablet 1 g  1 g Oral QID Agbata, Tochukwu, MD   1 g at 12/04/20 0852    VITAL SIGNS: BP 135/81 (BP Location: Left Arm)   Pulse (!) 102   Temp 98.7 F (37.1 C)   Resp 18  Ht 5\' 10"  (1.778 m)   Wt 170 lb (77.1 kg)   SpO2 95%   BMI 24.39 kg/m  Filed Weights   11/30/20 0942  Weight: 170 lb (77.1 kg)    Estimated body mass index is 24.39 kg/m as calculated from the following:   Height as of this encounter: 5\' 10"  (1.778 m).   Weight as of this encounter: 170 lb (77.1 kg).  LABS: CBC:    Component Value Date/Time   WBC 6.8 12/01/2020 0447   HGB 10.0 (L) 12/01/2020 0447   HCT 30.0 (L) 12/01/2020 0447   PLT 496 (H) 12/01/2020 0447   MCV 76.7 (L) 12/01/2020 0447   NEUTROABS 4.1 11/13/2020 0852   LYMPHSABS 1.1 11/13/2020 0852   MONOABS 0.6 11/13/2020 0852   EOSABS 0.0 11/13/2020 0852   BASOSABS 0.0 11/13/2020 0852   Comprehensive Metabolic Panel:    Component Value Date/Time   NA 135 12/04/2020 0234   K 3.6 12/04/2020 0234   CL 101 12/04/2020 0234   CO2 29 12/04/2020 0234   BUN 13 12/04/2020 0234   CREATININE 0.57 (L) 12/04/2020 0234   GLUCOSE 125 (H) 12/04/2020 0234   CALCIUM 8.8 (L) 12/04/2020 0234   AST 20 11/25/2020 0052   ALT 22 11/25/2020 0052   ALKPHOS 44 11/25/2020 0052   BILITOT 1.0 11/25/2020 0052   PROT 7.0 11/25/2020 0052   ALBUMIN 2.8 (L) 11/25/2020 0052    RADIOGRAPHIC  STUDIES: DG Chest 2 View  Result Date: 11/30/2020 CLINICAL DATA:  Chest pain, shortness of breath, chills EXAM: CHEST - 2 VIEW COMPARISON:  Chest radiograph and CTA chest 11/26/2010 FINDINGS: Chest wall port is stable in position. The cardiomediastinal silhouette is stable. Patchy opacity projecting over the left mid lung is unchanged. There is no new or progressive airspace disease. There is no pleural effusion or pneumothorax. The bones are stable. IMPRESSION: Unchanged masslike opacity projecting over the left midlung. No new or progressive airspace disease Electronically Signed   By: Valetta Mole M.D.   On: 11/30/2020 10:19   DG Chest 2 View  Result Date: 11/25/2020 CLINICAL DATA:  Chest pain EXAM: CHEST - 2 VIEW COMPARISON:  11/13/2020 FINDINGS: Masslike consolidation within the left lung base is again seen and appears stable since prior examination. Right lung is clear. No pneumothorax or pleural effusion. Right internal jugular chest port is seen with its tip at the superior right atrium. Cardiac size within normal limits. No acute bone abnormality. IMPRESSION: Stable masslike consolidation within the left lower lobe. Electronically Signed   By: Fidela Salisbury M.D.   On: 11/25/2020 01:29   DG Chest 2 View  Result Date: 11/13/2020 CLINICAL DATA:  A 70 year old male presents with chest pain in the setting of ongoing systemic therapy for pulmonary neoplasm. EXAM: CHEST - 2 VIEW COMPARISON:  October 30, 2020. FINDINGS: RIGHT Port-A-Cath in-situ, tip at the upper portion of the RIGHT atrium as on previous imaging. Stable LEFT perihilar masslike area with no new areas of consolidation. No visible pneumothorax. No sign of pleural effusion. On limited assessment no acute skeletal process. IMPRESSION: Stable chest x-ray. No acute changes with unchanged appearance of LEFT hilar distortion and masslike appearance. Electronically Signed   By: Zetta Bills M.D.   On: 11/13/2020 14:06   CT Angio Chest PE  W and/or Wo Contrast  Result Date: 11/30/2020 CLINICAL DATA:  Chest pain, shortness of breath EXAM: CT ANGIOGRAPHY CHEST WITH CONTRAST TECHNIQUE: Multidetector CT imaging of the chest was performed using the  standard protocol during bolus administration of intravenous contrast. Multiplanar CT image reconstructions and MIPs were obtained to evaluate the vascular anatomy. CONTRAST:  68mL OMNIPAQUE IOHEXOL 350 MG/ML SOLN COMPARISON:  11/25/2020 FINDINGS: Cardiovascular: Right IJ port catheter extends to right atrium. Heart size upper limits normal. Good contrast opacification of the pulmonary arterial tree. No definite acute PE, with significant image degradation through the lung bases secondary to confirm continued patient breathing motion during the study. Good contrast opacification of the thoracic aorta without dissection, aneurysm, or stenosis. Bovine variant brachiocephalic arterial origin anatomy without proximal stenosis. No significant atheromatous change. Mediastinum/Nodes: 1 cm pretracheal lymph node. Subcarinal adenopathy stable. 6.6 cm left hilar mass involving and occluding branches of the left pulmonary veins, abutting upper lower lobe bronchi, with central air bronchograms. A Lungs/Pleura: Left hilar mass extending into the inferior lingula and left lower lobe. Right lung clear. Some image degradation secondary to continued breathing during the acquisition. Upper Abdomen: Renal cysts.  No acute findings. Musculoskeletal: No chest wall abnormality. No acute or significant osseous findings. Review of the MIP images confirms the above findings. IMPRESSION: 1. Negative for acute PE or thoracic aortic dissection. 2. Persistent left hilar masslike process involving branches of the left pulmonary veins and extending to involve the inferior lingula and left lower lobe, suspicious for neoplasm, as has been previously described 3. No evidence of distal metastatic disease. Electronically Signed   By: Lucrezia Europe  M.D.   On: 11/30/2020 12:40   CT Angio Chest PE W and/or Wo Contrast  Result Date: 11/25/2020 CLINICAL DATA:  Three-month history of chest pain. History of lung cancer. EXAM: CT ANGIOGRAPHY CHEST WITH CONTRAST TECHNIQUE: Multidetector CT imaging of the chest was performed using the standard protocol during bolus administration of intravenous contrast. Multiplanar CT image reconstructions and MIPs were obtained to evaluate the vascular anatomy. CONTRAST:  69mL OMNIPAQUE IOHEXOL 350 MG/ML SOLN COMPARISON:  11/13/2020 FINDINGS: Cardiovascular: The heart is mildly enlarged but stable. No pericardial effusion. Mild tortuosity of the thoracic aorta but no aneurysm or dissection. Minimal atherosclerotic calcification at the aortic arch. The branch vessels are normal. No obvious coronary artery calcifications. The pulmonary arterial tree is fairly well opacified. No findings suspicious for pulmonary embolism. Extensive tumor surrounding the left pulmonary arteries. Mediastinum/Nodes: Persistent ill-defined soft tissue density involving the left hilar region and subcarinal space likely infiltrating tumor. Small stable right-sided hilar lymph nodes. Lungs/Pleura: Persistent and slightly progressive ill-defined soft tissue density involving the left hilum and left paramediastinal lung surrounding the left mainstem bronchus and left pulmonary artery consistent with recurrent tumor. There is also surrounding interstitial thickening and vague nodularity the left upper lobe and left lower lobe which could be obstructive pneumonitis or interstitial spread of tumor. The right lung remains relatively clear except for streaky right basilar atelectasis. No pulmonary lesions. No pleural effusions. Upper Abdomen: No significant upper abdominal findings. No hepatic or adrenal gland lesions. No upper abdominal adenopathy. Bilateral renal cysts are noted. Musculoskeletal: No significant bony findings. Review of the MIP images confirms  the above findings. IMPRESSION: 1. No CT findings for pulmonary embolism. 2. Persistent and slightly progressive ill-defined soft tissue density involving the left hilum and left paramediastinal lung most consistent with recurrent tumor. 3. Surrounding interstitial thickening and vague nodularity in the left upper lobe and left lower lobe could be obstructive pneumonitis or interstitial spread of tumor. 4. Left hilar and subcarinal adenopathy. 5. No findings for upper abdominal metastatic disease. Electronically Signed   By: Mamie Nick.  Gallerani M.D.   On: 11/25/2020 05:26   CT Angio Chest PE W and/or Wo Contrast  Result Date: 11/13/2020 CLINICAL DATA:  70 year old male with history of chest pain and shortness of breath. History of lung cancer status post chemotherapy and radiation therapy (complete 10 months ago). Recent diagnosis of COVID infection 10/20/2020. EXAM: CT ANGIOGRAPHY CHEST WITH CONTRAST TECHNIQUE: Multidetector CT imaging of the chest was performed using the standard protocol during bolus administration of intravenous contrast. Multiplanar CT image reconstructions and MIPs were obtained to evaluate the vascular anatomy. CONTRAST:  14mL OMNIPAQUE IOHEXOL 350 MG/ML SOLN COMPARISON:  Multiple priors, most recently chest CTA 11/04/2020. FINDINGS: Cardiovascular: No filling defects within the pulmonary arterial tree to suggest pulmonary embolism. Heart size is normal. There is no significant pericardial fluid, thickening or pericardial calcification. Aortic atherosclerosis. No definite coronary artery calcifications. Right single-lumen power porta cath with tip terminating in the right atrium. Mediastinum/Nodes: No pathologically enlarged mediastinal or right hilar lymph nodes. Prominent soft tissue in the left hilar region, similar to prior studies. Esophagus is unremarkable in appearance. No axillary lymphadenopathy. Lungs/Pleura: Increasingly bulky mass-like area of architectural distortion in the left  lung, most evident in the central aspect of the left upper lobe which has clearly progressed when compared to prior examinations dating back to 07/17/2020, with increasingly convex margins, concerning for locally recurrent disease. This is contiguous with bulky soft tissue in the left hilar region. Trace left pleural effusion lying dependently. Right lung is clear. No right pleural effusion. Upper Abdomen: 1.1 cm low-attenuation lesion in the upper pole the left kidney is compatible with a simple cyst. Musculoskeletal: There are no aggressive appearing lytic or blastic lesions noted in the visualized portions of the skeleton. Review of the MIP images confirms the above findings. IMPRESSION: 1. No evidence of pulmonary embolism. 2. Increasingly bulky mass-like architectural distortion throughout the left lung, most evident in the central aspect of the left upper lobe where there are increasingly convex margins, concerning for locally recurrent disease. Further evaluation with PET-CT is recommended in the near future to better evaluate these findings. 3. Trace left pleural effusion lying dependently. Electronically Signed   By: Vinnie Langton M.D.   On: 11/13/2020 15:56   ECHOCARDIOGRAM COMPLETE  Result Date: 11/11/2020    ECHOCARDIOGRAM REPORT   Patient Name:   DAISHON CHUI Date of Exam: 11/11/2020 Medical Rec #:  [id number]     Height:       70.0 in Accession #:    [id number]    Weight:       174.0 lb Date of Birth:  25-Oct-1950      BSA:          1.967 m Patient Age:    32 years      BP:           130/70 mmHg Patient Gender: M             HR:           104 bpm. Exam Location:  [facility] Procedure: 2D Echo, Cardiac Doppler, Color Doppler and Strain Analysis Indications:     Dyspnea R06.00  History:         Patient has prior history of Echocardiogram examinations, most                  recent 01/13/2020. Signs/Symptoms:Dyspnea; Risk                  Factors:Hypertension.  Sonographer:  Sherrie Sport Referring Phys:   [id number] Weston Anna RAO Diagnosing Phys: Kate Sable MD IMPRESSIONS  1. Left ventricular ejection fraction, by estimation, is 55 to 60%. The left ventricle has normal function. The left ventricle has no regional wall motion abnormalities. Left ventricular diastolic parameters are consistent with Grade I diastolic dysfunction (impaired relaxation).  2. Right ventricular systolic function is normal. The right ventricular size is normal.  3. The mitral valve is normal in structure. No evidence of mitral valve regurgitation.  4. The aortic valve is tricuspid. Aortic valve regurgitation is mild. Mild aortic valve sclerosis is present, with no evidence of aortic valve stenosis. FINDINGS  Left Ventricle: Left ventricular ejection fraction, by estimation, is 55 to 60%. The left ventricle has normal function. The left ventricle has no regional wall motion abnormalities. Global longitudinal strain performed but not reported based on interpreter judgement due to suboptimal tracking. 3D left ventricular ejection fraction analysis performed but not reported based on interpreter judgement due to suboptimal quality. The left ventricular internal cavity size was normal in size. There is no left ventricular hypertrophy. Left ventricular diastolic parameters are consistent with Grade I diastolic dysfunction (impaired relaxation). Right Ventricle: The right ventricular size is normal. No increase in right ventricular wall thickness. Right ventricular systolic function is normal. Left Atrium: Left atrial size was normal in size. Right Atrium: Right atrial size was normal in size. Pericardium: There is no evidence of pericardial effusion. Mitral Valve: The mitral valve is normal in structure. No evidence of mitral valve regurgitation. Tricuspid Valve: The tricuspid valve is normal in structure. Tricuspid valve regurgitation is not demonstrated. Aortic Valve: The aortic valve is tricuspid. Aortic valve regurgitation is mild. Mild  aortic valve sclerosis is present, with no evidence of aortic valve stenosis. Aortic valve mean gradient measures 3.0 mmHg. Aortic valve peak gradient measures 5.1 mmHg. Aortic valve area, by VTI measures 3.44 cm. Pulmonic Valve: The pulmonic valve was normal in structure. Pulmonic valve regurgitation is not visualized. Aorta: The aortic root is normal in size and structure. Venous: The inferior vena cava was not well visualized. IAS/Shunts: No atrial level shunt detected by color flow Doppler.  LEFT VENTRICLE PLAX 2D LVIDd:         3.80 cm  Diastology LVIDs:         2.40 cm  LV e' medial:    5.00 cm/s LV PW:         1.00 cm  LV E/e' medial:  10.9 LV IVS:        1.10 cm  LV e' lateral:   5.00 cm/s LVOT diam:     2.10 cm  LV E/e' lateral: 10.9 LV SV:         46 LV SV Index:   23 LVOT Area:     3.46 cm                          3D Volume EF:                         3D EF:        52 %                         LV EDV:       202 ml  LV ESV:       98 ml                         LV SV:        104 ml RIGHT VENTRICLE RV Basal diam:  2.30 cm RV S prime:     15.70 cm/s TAPSE (M-mode): 4.1 cm LEFT ATRIUM             Index       RIGHT ATRIUM           Index LA diam:        3.40 cm 1.73 cm/m  RA Area:     15.70 cm LA Vol (A2C):   42.7 ml 21.70 ml/m RA Volume:   34.90 ml  17.74 ml/m LA Vol (A4C):   23.7 ml 12.05 ml/m LA Biplane Vol: 32.4 ml 16.47 ml/m  AORTIC VALVE                   PULMONIC VALVE AV Area (Vmax):    2.76 cm    PV Vmax:        0.59 m/s AV Area (Vmean):   2.59 cm    PV Peak grad:   1.4 mmHg AV Area (VTI):     3.44 cm    RVOT Peak grad: 3 mmHg AV Vmax:           113.00 cm/s AV Vmean:          78.600 cm/s AV VTI:            0.134 m AV Peak Grad:      5.1 mmHg AV Mean Grad:      3.0 mmHg LVOT Vmax:         90.10 cm/s LVOT Vmean:        58.800 cm/s LVOT VTI:          0.133 m LVOT/AV VTI ratio: 0.99  AORTA Ao Root diam: 3.60 cm MITRAL VALVE                TRICUSPID VALVE MV Area (PHT): 3.91  cm     TR Peak grad:   14.6 mmHg MV Decel Time: 194 msec     TR Vmax:        191.00 cm/s MV E velocity: 54.40 cm/s MV A velocity: 101.00 cm/s  SHUNTS MV E/A ratio:  0.54         Systemic VTI:  0.13 m                             Systemic Diam: 2.10 cm Kate Sable MD Electronically signed by Kate Sable MD Signature Date/Time: 11/11/2020/5:54:58 PM    Final     PERFORMANCE STATUS (ECOG) : 4 - Bedbound  Review of Systems Unless otherwise noted, a complete review of systems is negative.  Physical Exam General: NAD Pulmonary: Unlabored Extremities: no edema, no joint deformities Skin: no rashes Neurological: Weakness but otherwise nonfocal  IMPRESSION: Follow-up visit.  Patient currently anxious appearing and endorses severe pain.  He says he has been hurting 10 out of 10 pain for the past 20-30 minutes.  However, he says overall pain has been better controlled.  Patient continues to endorse constipation.  No other symptomatic complaints at present.  Discussed with nurse who has been consistently administering oxycodone about every 4 hours.  This reportedly helps but short-lived.  We  will plan to dose increase the MS Contin to 30 mg every 12 hours with consideration for rotating back to the every 8 hour dosing if needed.  Note the patient is pending dose of Movantik for opioid-induced constipation.  Discussed with Dr. Janese Banks.  Patient is anxious appearing at times.  We will start him on lorazepam as needed.  PLAN: -Continue current scope of treatment -Increase MS Contin to 30 mg every 12 hours with consideration for rotating back to every 8 hour dosing if needed -Continue oxycodone as needed for breakthrough pain -Agree with Movantik for opioid-induced constipation -Start lorazepam 0.5 mg p.o. every 6 hours as needed for anxiety -We will follow   Time Total: 20 minutes  Visit consisted of counseling and education dealing with the complex and emotionally intense issues of symptom  management and palliative care in the setting of serious and potentially life-threatening illness.Greater than 50%  of this time was spent counseling and coordinating care related to the above assessment and plan.  Signed by: Altha Harm, PhD, NP-C

## 2020-12-04 NOTE — Telephone Encounter (Signed)
Patient still admitted 12/04/2020 3:50

## 2020-12-04 NOTE — Progress Notes (Signed)
PROGRESS NOTE    Mitchell Frye  JQB:[id number] DOB: [birth year] DOA: 11/30/2020 PCP: [facility]   Chief complaint.  Chest pain. Brief Narrative:  Mitchell Frye is a 70 y.o. male with medical history significant for hypertension, dyslipidemia, history of adenocarcinoma of the lung status post concurrent chemoradiation therapy and currently on MAB which he receives every 4 weeks, history of radiation esophagitis and hypertension.  He presently emergency room with severe chest pain.  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  Chest pain was due to lung cancer.  He was started on radiation therapy as well as pain medicine.     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 Right hilar mass secondary to lung cancer. Continue pain medicine and Derm patch. Continue radiation therapy. Patient still has significant pain, not able to discharge.  Severe constipation. Secondary to pain medicine. Patient still has no bowel movement after given lactulose.  Will order enema in p.m. if no bowel movement.  Chronic dysphagia. Stable.     DVT prophylaxis: Lovenox Code Status: full Family Communication:  Disposition Plan:      Status is: Inpatient   Remains inpatient appropriate because: Severe chest pain requiring pain control.  Unsafe discharge         I/O last 3 completed shifts: In: 240 [P.O.:240] Out: 300 [Urine:300] No intake/output data recorded.   Consultants:  Oncology   Procedures: None   Antimicrobials: None   Subjective: Patient still has significant constipation, no bowel movement after giving lactulose.  Enema ordered in p.m. if no bowel  movement. Still complaining of significant pain in his chest, no nausea vomiting.  No dysphagia. No short of breath or cough. No dysuria or hematuria.  Objective: Vitals:   12/03/20 1543 12/03/20 2004 12/04/20 0029 12/04/20 0756  BP: 139/76 116/69 127/73 112/72  Pulse: (!) 109 92 92 96  Resp: 19 20 16 20   Temp:  98.8 F (37.1 C)  98.3 F (36.8 C)  TempSrc:      SpO2: 90% 94% 96% 96%  Weight:      Height:        Intake/Output Summary (Last 24 hours) at 12/04/2020 1102 Last data filed at 12/04/2020 1022 Gross per 24 hour  Intake 0 ml  Output 300 ml  Net -300 ml   Filed Weights   11/30/20 0942  Weight: 77.1 kg    Examination:  General exam: Appears calm and comfortable  Respiratory system: Clear to auscultation. Respiratory effort normal. Cardiovascular system: S1 & S2 heard, RRR. No JVD, murmurs, rubs, gallops or clicks. No pedal edema. Gastrointestinal system: Abdomen is nondistended, soft and nontender. No organomegaly or masses felt. Normal bowel sounds heard. Central nervous system: Alert and oriented. No focal neurological deficits. Extremities: Symmetric 5 x 5 power. Skin: No rashes, lesions or ulcers Psychiatry: Judgement and insight appear normal. Mood & affect appropriate.     Data Reviewed: I have personally reviewed following labs and imaging studies  CBC: Recent Labs  Lab 11/30/20 0940 12/01/20 0447  WBC 8.8 6.8  HGB 11.5* 10.0*  HCT 34.8* 30.0*  MCV 77.3* 76.7*  PLT 614* 409*   Basic Metabolic Panel: Recent Labs  Lab 11/30/20 0940 12/01/20 0447 12/02/20 0501 12/03/20 0456 12/04/20 0234  NA 137 135 136 135 135  K 3.5 3.5 3.8 3.7 3.6  CL 104 103 101 102 101  CO2 22 25 26 27 29   GLUCOSE 155* 117* 123* 118* 125*  BUN 17 12 16 14 13   CREATININE 0.82 0.63 0.77 0.73 0.57*  CALCIUM 9.2 8.8* 9.0 8.9 8.8*   GFR: Estimated Creatinine Clearance: 87.4 mL/min (A) (by C-G formula based on SCr of 0.57 mg/dL (L)). Liver Function Tests: No  results for input(s): AST, ALT, ALKPHOS, BILITOT, PROT, ALBUMIN in the last 168 hours. No results for input(s): LIPASE, AMYLASE in the last 168 hours. No results for input(s): AMMONIA in the last 168 hours. Coagulation Profile: No results for input(s): INR, PROTIME in the last 168 hours. Cardiac Enzymes: No results for input(s): CKTOTAL, CKMB, CKMBINDEX, TROPONINI in the last 168 hours. BNP (last 3 results) No results for input(s): PROBNP in the last 8760 hours. HbA1C: No results for input(s): HGBA1C in the last 72 hours. CBG: No results for input(s): GLUCAP in the last 168 hours. Lipid Profile: No results for input(s): CHOL, HDL, LDLCALC, TRIG, CHOLHDL, LDLDIRECT in the last 72 hours. Thyroid Function Tests: No results for input(s): TSH, T4TOTAL, FREET4, T3FREE, THYROIDAB in the last 72 hours. Anemia Panel: No results for input(s): VITAMINB12, FOLATE, FERRITIN, TIBC, IRON, RETICCTPCT in the last 72 hours. Sepsis Labs: No results for input(s): PROCALCITON, LATICACIDVEN in the last 168 hours.  Recent Results (from the past 240 hour(s))  Resp Panel by RT-PCR (Flu A&B, Covid) Nasopharyngeal Swab     Status: None   Collection Time: 11/30/20  1:38 PM   Specimen: Nasopharyngeal Swab; Nasopharyngeal(NP) swabs in vial transport medium  Result Value Ref Range Status   SARS Coronavirus 2 by RT PCR NEGATIVE NEGATIVE Final    Comment: (NOTE) SARS-CoV-2 target nucleic acids are NOT DETECTED.  The SARS-CoV-2 RNA is generally detectable in upper respiratory specimens during the acute phase of infection. The lowest concentration of SARS-CoV-2 viral copies this assay can detect is 138 copies/mL. A negative result does not preclude SARS-Cov-2 infection and should not be used as the sole basis for treatment or other patient management decisions. A negative result may occur with  improper specimen collection/handling, submission of specimen other than nasopharyngeal swab, presence of viral  mutation(s) within the areas targeted by this assay, and inadequate number of viral copies(<138 copies/mL). A negative result must be combined with clinical observations, patient history, and epidemiological information. The expected result is Negative.  Fact Sheet for Patients:  EntrepreneurPulse.com.au  Fact Sheet for Healthcare Providers:  IncredibleEmployment.be  This test is no t yet approved or cleared by the [place] FDA and  has been authorized for detection and/or diagnosis of SARS-CoV-2 by FDA under an Emergency Use Authorization (EUA). This EUA will remain  in effect (meaning this test can be used) for the duration of the COVID-19 declaration under Section 564(b)(1) of the Act, 21 U.S.C.section 360bbb-3(b)(1), unless the authorization is terminated  or revoked sooner.       Influenza A by PCR NEGATIVE NEGATIVE Final   Influenza B by PCR NEGATIVE NEGATIVE Final    Comment: (NOTE) The Xpert Xpress SARS-CoV-2/FLU/RSV plus assay is intended as an aid in the diagnosis of influenza from Nasopharyngeal swab specimens and should not be used as a sole basis for treatment. Nasal washings and aspirates are unacceptable for Xpert Xpress SARS-CoV-2/FLU/RSV testing.  Fact Sheet for Patients: EntrepreneurPulse.com.au  Fact  Sheet for Healthcare Providers: IncredibleEmployment.be  This test is not yet approved or cleared by the [place] and has been authorized for detection and/or diagnosis of SARS-CoV-2 by FDA under an Emergency Use Authorization (EUA). This EUA will remain in effect (meaning this test can be used) for the duration of the COVID-19 declaration under Section 564(b)(1) of the Act, 21 U.S.C. section 360bbb-3(b)(1), unless the authorization is terminated or revoked.  Performed at [facility], [address] [place], [place] [address]          Radiology  Studies: No results found.      Scheduled Meds:  enoxaparin (LOVENOX) injection  40 mg Subcutaneous Q24H   umeclidinium bromide  1 puff Inhalation Daily   And   fluticasone furoate-vilanterol  1 puff Inhalation Daily   gabapentin  300 mg Oral BID   lactulose  20 g Oral BID   lidocaine  1 patch Transdermal Q24H   morphine  15 mg Oral Q8H   pantoprazole  40 mg Oral BID AC   polyethylene glycol  17 g Oral Daily   senna  1 tablet Oral BID   sodium chloride flush  3 mL Intravenous Q12H   sucralfate  1 g Oral QID   Continuous Infusions:   LOS: 3 days    Time spent: 26 minutes    Sharen Hones, MD [facility]   To contact the attending provider between 7A-7P or the covering provider during after hours 7P-7A, please log into the web site [email or web address] and access using universal [place] password for that web site. If you do not have the password, please call the hospital operator.  12/04/2020, 11:02 AM

## 2020-12-05 DIAGNOSIS — G893 Neoplasm related pain (acute) (chronic): Secondary | ICD-10-CM | POA: Diagnosis not present

## 2020-12-05 DIAGNOSIS — C3432 Malignant neoplasm of lower lobe, left bronchus or lung: Secondary | ICD-10-CM | POA: Diagnosis not present

## 2020-12-05 DIAGNOSIS — R0789 Other chest pain: Secondary | ICD-10-CM | POA: Diagnosis not present

## 2020-12-05 LAB — BASIC METABOLIC PANEL
Anion gap: 7 (ref 5–15)
BUN: 14 mg/dL (ref 8–23)
CO2: 30 mmol/L (ref 22–32)
Calcium: 8.9 mg/dL (ref 8.9–10.3)
Chloride: 99 mmol/L (ref 98–111)
Creatinine, Ser: 0.64 mg/dL (ref 0.61–1.24)
GFR, Estimated: >60 mL/min (ref >60)
Glucose, Bld: 112 mg/dL — ABNORMAL HIGH (ref 70–99)
Potassium: 3.8 mmol/L (ref 3.5–5.1)
Sodium: 136 mmol/L (ref 135–145)

## 2020-12-05 MED ORDER — OXYCODONE HCL 20 MG PO TABS: 20.0000 mg | ORAL_TABLET | ORAL | 0 refills | Status: DC | PRN

## 2020-12-05 MED ORDER — MORPHINE SULFATE ER 30 MG PO TBCR
30.0000 mg | EXTENDED_RELEASE_TABLET | Freq: Two times a day (BID) | ORAL | 0 refills | Status: DC
Start: 2020-12-05 — End: 2020-12-09

## 2020-12-05 MED ORDER — NALOXEGOL OXALATE 25 MG PO TABS: 25.0000 mg | ORAL_TABLET | Freq: Every day | ORAL | 0 refills | Status: DC

## 2020-12-05 MED ORDER — LIDOCAINE 5 % EX PTCH: 1.0000 | MEDICATED_PATCH | CUTANEOUS | 0 refills | Status: DC

## 2020-12-05 NOTE — Discharge Summary (Signed)
Physician Discharge Summary  Patient ID: Mitchell Frye MRN: [id number] DOB/AGE: 1950-03-23 70 y.o.  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 male with medical history significant for hypertension, dyslipidemia, history of adenocarcinoma of the lung status post concurrent chemoradiation therapy and currently on MAB which he receives every 4 weeks, history of radiation esophagitis and hypertension.  He presently emergency room with severe chest pain.  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  Chest pain was due to lung cancer.  He was started on radiation therapy as well as pain medicine.  Severe substernal chest pain secondary to metastatic lung cancer. Right hilar mass secondary to lung cancer. Patient received radiation therapy and increased pain medicine while in the hospital.  Patient still have significant pain, MS Contin was increased to 30 mg twice a day, also increased oxycodone.  Continue Lidoderm patch.  Discussed with oncology yesterday, patient can be discharged today with pain medicine.  Patient also has been evaluated by palliative care due to poor prognosis.  Patient will be continue followed with them after discharge.  Severe constipation. Secondary to pain medicine. Patient still has no bowel movement after given lactulose.  Patient started have bowel movement after giving Movantik.  We will continue after discharge.   Chronic dysphagia. Stable.  Consults: hematology/oncology  Significant Diagnostic Studies:   Treatments: Pain meds, radiation therapy  Discharge Exam: Blood pressure 116/66, pulse 87, temperature 98.7 F (37.1 C),  temperature source Oral, resp. rate 18, height 5\' 10"  (1.778 m), weight 77.1 kg, SpO2 94 %. General appearance: alert and cooperative Resp: clear to auscultation bilaterally Cardio: regular rate and rhythm, S1, S2 normal, no murmur, click, rub or gallop GI: soft, non-tender; bowel sounds normal; no masses,  no organomegaly Extremities: extremities normal, atraumatic, no cyanosis or edema  Disposition: Discharge disposition: 01-Home or Self Care       Discharge Instructions     Diet - low sodium heart healthy   Complete by: As directed    Increase activity slowly   Complete by: As directed       Allergies as of 12/05/2020       Reactions   Taxol [paclitaxel] Other (See Comments)   Chest pain, hip pain, coughing , wheezing        Medication List     STOP taking these medications    benzonatate 100 MG capsule Commonly known as: Tessalon Perles   famotidine 20 MG tablet Commonly known as: Pepcid   HYDROcodone bit-homatropine 5-1.5 MG/5ML syrup Commonly known as: Hydromet   lidocaine-prilocaine cream Commonly known as: EMLA   traZODone 50 MG tablet Commonly known as: DESYREL       TAKE these medications    acetaminophen 500 MG tablet Commonly known as: TYLENOL Take 1,000 mg by mouth every 6 (six) hours as needed for mild pain, moderate pain or headache.   albuterol 108 (90 Base) MCG/ACT inhaler Commonly known as: VENTOLIN HFA Inhale 2 puffs into the lungs every 4 (four) hours as needed for wheezing or shortness of breath.   Breztri Aerosphere 160-9-4.8 MCG/ACT Aero Generic drug: Budeson-Glycopyrrol-Formoterol Inhale 2 puffs into the lungs in the morning and at  bedtime.   clobetasol cream 0.05 % Commonly known as: TEMOVATE Apply topically.   hydrOXYzine 25 MG tablet Commonly known as: ATARAX/VISTARIL Take 25 mg by mouth daily.   lidocaine 2 % solution Commonly known as: XYLOCAINE Use as directed 15 mLs in the mouth or throat every 6 (six) hours  as needed (abdominal pain).   lidocaine 5 % Commonly known as: LIDODERM Place 1 patch onto the skin daily. Remove & Discard patch within 12 hours or as directed by MD   morphine 30 MG 12 hr tablet Commonly known as: MS CONTIN Take 1 tablet (30 mg total) by mouth every 12 (twelve) hours.   naloxegol oxalate 25 MG Tabs tablet Commonly known as: MOVANTIK Take 1 tablet (25 mg total) by mouth daily. Start taking on: December 06, 2020   Oxycodone HCl 20 MG Tabs Take 1 tablet (20 mg total) by mouth every 4 (four) hours as needed for moderate pain or severe pain (use po pain meds first before IV pain meds). What changed:  medication strength how much to take reasons to take this   pantoprazole 40 MG tablet Commonly known as: Protonix Take 1 tablet (40mg ) twice daily for two weeks and then take 1 tablet daily thereafter   potassium chloride SA 20 MEQ tablet Commonly known as: KLOR-CON TAKE 1 TABLET(20 MEQ) BY MOUTH TWICE DAILY   sucralfate 1 g tablet Commonly known as: Carafate Take 1 tablet (1 g total) by mouth 4 (four) times daily.        [place], [facility] Follow up in 1 week(s).   Contact information: [place] [address] [phone]         Sindy Guadeloupe, MD Follow up in 1 week(s).   Specialty: Oncology Contact information: [place] [address] [phone]                 Signed: Sharen Hones 12/05/2020, 9:58 AM

## 2020-12-05 NOTE — Progress Notes (Signed)
CSW spoke with Mitchell Frye at [facility] and patients 3 in 1 and RW will be delivered to the room.

## 2020-12-07 ENCOUNTER — Other Ambulatory Visit: Payer: Self-pay | Admitting: *Deleted

## 2020-12-07 ENCOUNTER — Ambulatory Visit
Admission: RE | Admit: 2020-12-07 | Discharge: 2020-12-07 | Disposition: A | Discharge status: Home / Self Care | Payer: Medicare Other | Source: Ambulatory Visit | Attending: Radiation Oncology | Admitting: Radiation Oncology

## 2020-12-07 DIAGNOSIS — G893 Neoplasm related pain (acute) (chronic): Secondary | ICD-10-CM | POA: Diagnosis not present

## 2020-12-07 DIAGNOSIS — R590 Localized enlarged lymph nodes: Secondary | ICD-10-CM | POA: Diagnosis not present

## 2020-12-07 DIAGNOSIS — F419 Anxiety disorder, unspecified: Secondary | ICD-10-CM | POA: Diagnosis not present

## 2020-12-07 DIAGNOSIS — Z510 Encounter for antineoplastic radiation therapy: Secondary | ICD-10-CM | POA: Diagnosis not present

## 2020-12-07 DIAGNOSIS — J90 Pleural effusion, not elsewhere classified: Secondary | ICD-10-CM | POA: Diagnosis not present

## 2020-12-07 DIAGNOSIS — E785 Hyperlipidemia, unspecified: Secondary | ICD-10-CM | POA: Diagnosis not present

## 2020-12-07 DIAGNOSIS — K5903 Drug induced constipation: Secondary | ICD-10-CM | POA: Diagnosis not present

## 2020-12-07 DIAGNOSIS — I10 Essential (primary) hypertension: Secondary | ICD-10-CM | POA: Diagnosis not present

## 2020-12-07 DIAGNOSIS — Z87891 Personal history of nicotine dependence: Secondary | ICD-10-CM | POA: Diagnosis not present

## 2020-12-07 DIAGNOSIS — I700 Atherosclerosis of aorta: Secondary | ICD-10-CM | POA: Diagnosis not present

## 2020-12-07 DIAGNOSIS — R911 Solitary pulmonary nodule: Secondary | ICD-10-CM | POA: Diagnosis not present

## 2020-12-07 DIAGNOSIS — T402X5A Adverse effect of other opioids, initial encounter: Secondary | ICD-10-CM | POA: Diagnosis not present

## 2020-12-07 DIAGNOSIS — N281 Cyst of kidney, acquired: Secondary | ICD-10-CM | POA: Diagnosis not present

## 2020-12-07 DIAGNOSIS — Z79899 Other long term (current) drug therapy: Secondary | ICD-10-CM | POA: Diagnosis not present

## 2020-12-07 DIAGNOSIS — Z923 Personal history of irradiation: Secondary | ICD-10-CM | POA: Diagnosis not present

## 2020-12-07 DIAGNOSIS — C3432 Malignant neoplasm of lower lobe, left bronchus or lung: Secondary | ICD-10-CM | POA: Diagnosis not present

## 2020-12-07 DIAGNOSIS — Z9221 Personal history of antineoplastic chemotherapy: Secondary | ICD-10-CM | POA: Diagnosis not present

## 2020-12-07 DIAGNOSIS — Z5111 Encounter for antineoplastic chemotherapy: Secondary | ICD-10-CM | POA: Diagnosis present

## 2020-12-07 MED ORDER — LIDOCAINE-PRILOCAINE 2.5-2.5 % EX CREA: 1.0000 "application " | TOPICAL_CREAM | CUTANEOUS | 0 refills | Status: DC | PRN

## 2020-12-07 MED ORDER — PANTOPRAZOLE SODIUM 40 MG PO TBEC: DELAYED_RELEASE_TABLET | ORAL | 3 refills | Status: DC

## 2020-12-07 NOTE — Telephone Encounter (Signed)
Spoke to patient and offered OV for 12/09/2020. Patient stated that he would check to see if he can arrange transportation.  He will call back with update.

## 2020-12-08 ENCOUNTER — Other Ambulatory Visit: Payer: Self-pay | Admitting: Oncology

## 2020-12-08 ENCOUNTER — Other Ambulatory Visit: Payer: Self-pay | Admitting: *Deleted

## 2020-12-08 ENCOUNTER — Inpatient Hospital Stay: Payer: Medicare Other | Attending: Hospice and Palliative Medicine

## 2020-12-08 ENCOUNTER — Other Ambulatory Visit: Payer: Self-pay

## 2020-12-08 ENCOUNTER — Ambulatory Visit
Admission: RE | Admit: 2020-12-08 | Discharge: 2020-12-08 | Disposition: A | Discharge status: Home / Self Care | Payer: Medicare Other | Source: Ambulatory Visit | Attending: Oncology | Admitting: Oncology

## 2020-12-08 ENCOUNTER — Ambulatory Visit
Admission: RE | Admit: 2020-12-08 | Discharge: 2020-12-08 | Disposition: A | Discharge status: Home / Self Care | Payer: Medicare Other | Source: Ambulatory Visit | Attending: Radiation Oncology | Admitting: Radiation Oncology

## 2020-12-08 DIAGNOSIS — Z9221 Personal history of antineoplastic chemotherapy: Secondary | ICD-10-CM | POA: Insufficient documentation

## 2020-12-08 DIAGNOSIS — J90 Pleural effusion, not elsewhere classified: Secondary | ICD-10-CM | POA: Insufficient documentation

## 2020-12-08 DIAGNOSIS — R911 Solitary pulmonary nodule: Secondary | ICD-10-CM | POA: Insufficient documentation

## 2020-12-08 DIAGNOSIS — C3432 Malignant neoplasm of lower lobe, left bronchus or lung: Secondary | ICD-10-CM | POA: Insufficient documentation

## 2020-12-08 DIAGNOSIS — Z79899 Other long term (current) drug therapy: Secondary | ICD-10-CM | POA: Insufficient documentation

## 2020-12-08 DIAGNOSIS — R590 Localized enlarged lymph nodes: Secondary | ICD-10-CM | POA: Insufficient documentation

## 2020-12-08 DIAGNOSIS — T402X5A Adverse effect of other opioids, initial encounter: Secondary | ICD-10-CM | POA: Insufficient documentation

## 2020-12-08 DIAGNOSIS — E785 Hyperlipidemia, unspecified: Secondary | ICD-10-CM | POA: Insufficient documentation

## 2020-12-08 DIAGNOSIS — F419 Anxiety disorder, unspecified: Secondary | ICD-10-CM | POA: Insufficient documentation

## 2020-12-08 DIAGNOSIS — G893 Neoplasm related pain (acute) (chronic): Secondary | ICD-10-CM

## 2020-12-08 DIAGNOSIS — C3490 Malignant neoplasm of unspecified part of unspecified bronchus or lung: Secondary | ICD-10-CM

## 2020-12-08 DIAGNOSIS — I700 Atherosclerosis of aorta: Secondary | ICD-10-CM | POA: Insufficient documentation

## 2020-12-08 DIAGNOSIS — Z923 Personal history of irradiation: Secondary | ICD-10-CM | POA: Insufficient documentation

## 2020-12-08 DIAGNOSIS — Z510 Encounter for antineoplastic radiation therapy: Secondary | ICD-10-CM | POA: Insufficient documentation

## 2020-12-08 DIAGNOSIS — N281 Cyst of kidney, acquired: Secondary | ICD-10-CM | POA: Insufficient documentation

## 2020-12-08 DIAGNOSIS — I10 Essential (primary) hypertension: Secondary | ICD-10-CM | POA: Insufficient documentation

## 2020-12-08 DIAGNOSIS — Z5111 Encounter for antineoplastic chemotherapy: Secondary | ICD-10-CM | POA: Insufficient documentation

## 2020-12-08 DIAGNOSIS — Z87891 Personal history of nicotine dependence: Secondary | ICD-10-CM | POA: Insufficient documentation

## 2020-12-08 DIAGNOSIS — K5903 Drug induced constipation: Secondary | ICD-10-CM | POA: Insufficient documentation

## 2020-12-08 LAB — GLUCOSE, CAPILLARY: Glucose-Capillary: 112 mg/dL — ABNORMAL HIGH (ref 70–99)

## 2020-12-08 MED ORDER — OXYCODONE HCL 20 MG PO TABS: 20.0000 mg | ORAL_TABLET | ORAL | 0 refills | Status: DC | PRN

## 2020-12-08 MED ORDER — FLUDEOXYGLUCOSE F - 18 (FDG) INJECTION
8.8000 | Freq: Once | INTRAVENOUS | Status: AC | PRN
  Administered 2020-12-08: 9.39 via INTRAVENOUS

## 2020-12-08 MED ORDER — LIDOCAINE VISCOUS HCL 2 % MT SOLN: 15.0000 mL | Freq: Four times a day (QID) | OROMUCOSAL | 0 refills | Status: DC | PRN

## 2020-12-08 NOTE — Telephone Encounter (Signed)
Spoke to patient for update. Patient stated that he is still in process of arranging transportation.  He will call back later today with update.

## 2020-12-08 NOTE — Progress Notes (Signed)
DISCONTINUE ON PATHWAY REGIMEN - Non-Small Cell Lung     A cycle is every 14 days:     Durvalumab   **Always confirm dose/schedule in your pharmacy ordering system**  REASON: Disease Progression PRIOR TREATMENT: [id number]: Durvalumab 10 mg/kg q14 Days x up to 12 Months TREATMENT RESPONSE: Progressive Disease (PD)  START OFF PATHWAY REGIMEN - Non-Small Cell Lung   [id number]:Carboplatin AUC=5 + Pemetrexed 500 mg/m2 q21 Days:   A cycle is every 21 days:     Pemetrexed      Carboplatin   **Always confirm dose/schedule in your pharmacy ordering system**  Patient Characteristics: Preoperative or Nonsurgical Candidate (Clinical Staging), [name] - Nonsurgical Candidate (Nonsquamous and Squamous), PS = 0, 1 Therapeutic Status: Preoperative or Nonsurgical Candidate (Clinical Staging) AJCC T Category: cT2b AJCC N Category: cN3 AJCC M Category: cM0 AJCC 8 Stage Grouping: IIIB ECOG Performance Status: 1 Intent of Therapy: Non-Curative / Palliative Intent, Discussed with Patient

## 2020-12-08 NOTE — Telephone Encounter (Signed)
Pt requesting refill of oxycodone and lidocaine solution to help with his pain.

## 2020-12-09 ENCOUNTER — Ambulatory Visit
Admission: RE | Admit: 2020-12-09 | Discharge: 2020-12-09 | Disposition: A | Discharge status: Home / Self Care | Payer: Medicare Other | Source: Ambulatory Visit | Attending: Radiation Oncology | Admitting: Radiation Oncology

## 2020-12-09 ENCOUNTER — Other Ambulatory Visit: Payer: Self-pay | Admitting: Oncology

## 2020-12-09 ENCOUNTER — Inpatient Hospital Stay (HOSPITAL_BASED_OUTPATIENT_CLINIC_OR_DEPARTMENT_OTHER): Payer: Medicare Other | Admitting: Hospice and Palliative Medicine

## 2020-12-09 ENCOUNTER — Inpatient Hospital Stay: Payer: Medicare Other

## 2020-12-09 VITALS — BP 118/74 | HR 96 | Temp 98.7°F

## 2020-12-09 DIAGNOSIS — C3432 Malignant neoplasm of lower lobe, left bronchus or lung: Secondary | ICD-10-CM | POA: Diagnosis not present

## 2020-12-09 DIAGNOSIS — F419 Anxiety disorder, unspecified: Secondary | ICD-10-CM

## 2020-12-09 DIAGNOSIS — Z515 Encounter for palliative care: Secondary | ICD-10-CM | POA: Diagnosis not present

## 2020-12-09 DIAGNOSIS — G893 Neoplasm related pain (acute) (chronic): Secondary | ICD-10-CM

## 2020-12-09 DIAGNOSIS — Z5111 Encounter for antineoplastic chemotherapy: Secondary | ICD-10-CM | POA: Diagnosis not present

## 2020-12-09 MED ORDER — LORAZEPAM 0.5 MG PO TABS: 0.5000 mg | ORAL_TABLET | Freq: Three times a day (TID) | ORAL | 0 refills | Status: DC | PRN

## 2020-12-09 MED ORDER — NALOXONE HCL 4 MG/0.1ML NA LIQD: NASAL | 0 refills | Status: DC

## 2020-12-09 MED ORDER — OXYCODONE HCL 20 MG PO TABS: 20.0000 mg | ORAL_TABLET | ORAL | 0 refills | Status: DC | PRN

## 2020-12-09 MED ORDER — DULOXETINE HCL 30 MG PO CPEP: 30.0000 mg | ORAL_CAPSULE | Freq: Every day | ORAL | 3 refills | Status: DC

## 2020-12-09 MED ORDER — MORPHINE SULFATE ER 30 MG PO TBCR: 30.0000 mg | EXTENDED_RELEASE_TABLET | Freq: Three times a day (TID) | ORAL | 0 refills | Status: DC

## 2020-12-09 NOTE — Progress Notes (Signed)
Symptom Management [place]  [phone] [phone]  Patient Care Team: Center, [facility] as PCP - General Telford Nab, RN as Oncology Nurse Navigator   Name of the patient: Mitchell Frye  [id number]  04-06-1950   Date of visit: 12/09/20  Reason for Consult:  Mitchell Frye is a 70 y.o. 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  Patient is status post chemotherapy now on maintenance durvalumab.  Patient was hospitalized 11/30/2020-12/06/2018 with intractable chest pain thought secondary to his lung cancer.  Patient received XRT and pain medication was liberalized.  Patient underwent PET scan yesterday results are still pending.  Patient presents to [facility] today with continued complaint of persistent and severe chest pain.  He says that the pain medications are helping but do not last long enough.  He asks for dose escalation of his pain medications  Patient initially had significant difficulty telling me when and what pain medications he was taking.  I called and spoke with his sister who says that she is giving the MS Contin 30 mg every 12 hours and the oxycodone 20 mg about every 4 hours around-the-clock.  Patient is also using a Lidoderm patch to his chest and says that that helped some.  Patient does endorse significant anxiety and claustrophobia.  He admits that the pain worsens his anxiety and the anxiety worsens his pain.  Denies any neurologic complaints. Denies recent fevers or illnesses. Denies any easy bleeding or bruising. Reports good appetite and denies weight loss. Denies any nausea, vomiting, constipation, or diarrhea. Denies urinary complaints. Patient offers no further specific complaints today.  PAST MEDICAL HISTORY: Past Medical History:  Diagnosis Date   Dyspnea    Hyperlipidemia     Hypertension    Primary malignant neoplasm of left lower lobe of lung ([place])     PAST SURGICAL HISTORY:  Past Surgical History:  Procedure Laterality Date   ESOPHAGOGASTRODUODENOSCOPY (EGD) WITH PROPOFOL N/A 12/01/2020   Procedure: ESOPHAGOGASTRODUODENOSCOPY (EGD) WITH PROPOFOL;  Surgeon: Lin Landsman, MD;  Location: [facility] ENDOSCOPY;  Service: Gastroenterology;  Laterality: N/A;   PORTA CATH INSERTION N/A 10/22/2019   Procedure: PORTA CATH INSERTION;  Surgeon: Katha Cabal, MD;  Location: [place] [facility];  Service: Cardiovascular;  Laterality: N/A;   VIDEO BRONCHOSCOPY WITH ENDOBRONCHIAL ULTRASOUND N/A 09/25/2019   Procedure: VIDEO BRONCHOSCOPY WITH ENDOBRONCHIAL ULTRASOUND;  Surgeon: Tyler Pita, MD;  Location: [facility] ORS;  Service: Pulmonary;  Laterality: N/A;    HEMATOLOGY/ONCOLOGY HISTORY:  Oncology History  Malignant neoplasm of lower lobe of left lung ([place])  10/04/2019 Initial Diagnosis   Malignant neoplasm of lower lobe of left lung ([place])   10/04/2019 Cancer Staging   Staging form: Lung, AJCC 8th Edition - Clinical stage from 10/04/2019: Stage IIIB (cT2b, cN3, cM0) - Signed by Sindy Guadeloupe, MD on 10/04/2019   10/28/2019 - 11/18/2019 Chemotherapy   The patient had dexamethasone (DECADRON) 4 MG tablet, 8 mg, Oral, Daily, 1 of 1 cycle, Start date: 10/04/2019, End date: -- palonosetron (ALOXI) injection 0.25 mg, 0.25 mg, Intravenous,  Once, 4 of 4 cycles Administration: 0.25 mg (10/28/2019), 0.25 mg (11/04/2019), 0.25 mg (11/11/2019), 0.25 mg (11/18/2019) PACLitaxel-protein bound (ABRAXANE) chemo infusion 200 mg, 100 mg/m2 = 200 mg (100 % of original dose 100 mg/m2), Intravenous,  Once, 4 of 4 cycles Dose modification: 100 mg/m2 (original dose 100 mg/m2, Cycle 2), 80 mg/m2 (  original dose 100 mg/m2, Cycle 3, Reason: Other (see comments), Comment: neutropenia), 100 mg/m2 (original dose 100 mg/m2, Cycle 1) Administration: 200 mg (11/04/2019), 175 mg (11/11/2019), 200 mg  (10/29/2019), 175 mg (11/18/2019) CARBOplatin (PARAPLATIN) 210 mg in sodium chloride 0.9 % 250 mL chemo infusion, 210 mg (100 % of original dose 214.6 mg), Intravenous,  Once, 4 of 4 cycles Dose modification:   (original dose 214.6 mg, Cycle 1) Administration: 210 mg (10/28/2019), 210 mg (11/04/2019), 210 mg (11/11/2019), 210 mg (11/18/2019) PACLitaxel (TAXOL) 90 mg in sodium chloride 0.9 % 250 mL chemo infusion (</= 80mg /m2), 45 mg/m2 = 90 mg, Intravenous,  Once, 1 of 1 cycle Administration: 90 mg (10/28/2019)  for chemotherapy treatment.    11/25/2019 - 11/25/2019 Chemotherapy   The patient had palonosetron (ALOXI) injection 0.25 mg, 0.25 mg, Intravenous,  Once, 1 of 1 cycle Administration: 0.25 mg (11/25/2019) PEMEtrexed (ALIMTA) 1,000 mg in sodium chloride 0.9 % 100 mL chemo infusion, 500 mg/m2 = 1,000 mg, Intravenous,  Once, 1 of 1 cycle Administration: 1,000 mg (11/25/2019) CARBOplatin (PARAPLATIN) 550 mg in sodium chloride 0.9 % 250 mL chemo infusion, 550 mg (100 % of original dose 548 mg), Intravenous,  Once, 1 of 1 cycle Dose modification:   (original dose 548 mg, Cycle 1) Administration: 550 mg (11/25/2019) fosaprepitant (EMEND) 150 mg in sodium chloride 0.9 % 145 mL IVPB, 150 mg, Intravenous,  Once, 1 of 1 cycle Administration: 150 mg (11/25/2019)  for chemotherapy treatment.    01/14/2020 - 11/13/2020 Chemotherapy   Patient is on Treatment Plan : LUNG Durvalumab q14d     12/09/2020 -  Chemotherapy   Patient is on Treatment Plan : LUNG NSCLC Pemetrexed + Carboplatin q21d x 4 Cycles       ALLERGIES:  is allergic to taxol [paclitaxel].  MEDICATIONS:  Current Outpatient Medications  Medication Sig Dispense Refill   potassium chloride SA (KLOR-CON) 20 MEQ tablet TAKE 1 TABLET BY MOUTH TWICE DAILY 30 tablet 1   acetaminophen (TYLENOL) 500 MG tablet Take 1,000 mg by mouth every 6 (six) hours as needed for mild pain, moderate pain or headache. (Patient not taking: No sig reported)      albuterol (VENTOLIN HFA) 108 (90 Base) MCG/ACT inhaler Inhale 2 puffs into the lungs every 4 (four) hours as needed for wheezing or shortness of breath. 8 g 1   Budeson-Glycopyrrol-Formoterol (BREZTRI AEROSPHERE) 160-9-4.8 MCG/ACT AERO Inhale 2 puffs into the lungs in the morning and at bedtime. 10.7 g 5   clobetasol cream (TEMOVATE) 0.05 % Apply topically.     hydrOXYzine (ATARAX/VISTARIL) 25 MG tablet Take 25 mg by mouth daily.     lidocaine (LIDODERM) 5 % Place 1 patch onto the skin daily. Remove & Discard patch within 12 hours or as directed by MD 30 patch 0   lidocaine (XYLOCAINE) 2 % solution Use as directed 15 mLs in the mouth or throat every 6 (six) hours as needed (abdominal pain). 100 mL 0   lidocaine-prilocaine (EMLA) cream Apply 1 application topically as needed. 30 g 0   morphine (MS CONTIN) 30 MG 12 hr tablet Take 1 tablet (30 mg total) by mouth every 12 (twelve) hours. 10 tablet 0   naloxegol oxalate (MOVANTIK) 25 MG TABS tablet Take 1 tablet (25 mg total) by mouth daily. 30 tablet 0   Oxycodone HCl 20 MG TABS Take 1 tablet (20 mg total) by mouth every 4 (four) hours as needed. 60 tablet 0   pantoprazole (PROTONIX) 40 MG tablet Take  1 tablet (40mg ) twice daily for two weeks and then take 1 tablet daily thereafter 60 tablet 3   sucralfate (CARAFATE) 1 g tablet Take 1 tablet (1 g total) by mouth 4 (four) times daily. 60 tablet 0   No current facility-administered medications for this visit.    VITAL SIGNS: There were no vitals taken for this visit. There were no vitals filed for this visit.  Estimated body mass index is 24.39 kg/m as calculated from the following:   Height as of 11/30/20: 5\' 10"  (1.778 m).   Weight as of 11/30/20: 170 lb (77.1 kg).  LABS: CBC:    Component Value Date/Time   WBC 6.8 12/01/2020 0447   HGB 10.0 (L) 12/01/2020 0447   HCT 30.0 (L) 12/01/2020 0447   PLT 496 (H) 12/01/2020 0447   MCV 76.7 (L) 12/01/2020 0447   NEUTROABS 4.1 11/13/2020 0852    LYMPHSABS 1.1 11/13/2020 0852   MONOABS 0.6 11/13/2020 0852   EOSABS 0.0 11/13/2020 0852   BASOSABS 0.0 11/13/2020 0852   Comprehensive Metabolic Panel:    Component Value Date/Time   NA 136 12/05/2020 0439   K 3.8 12/05/2020 0439   CL 99 12/05/2020 0439   CO2 30 12/05/2020 0439   BUN 14 12/05/2020 0439   CREATININE 0.64 12/05/2020 0439   GLUCOSE 112 (H) 12/05/2020 0439   CALCIUM 8.9 12/05/2020 0439   AST 20 11/25/2020 0052   ALT 22 11/25/2020 0052   ALKPHOS 44 11/25/2020 0052   BILITOT 1.0 11/25/2020 0052   PROT 7.0 11/25/2020 0052   ALBUMIN 2.8 (L) 11/25/2020 0052    RADIOGRAPHIC STUDIES: DG Chest 2 View  Result Date: 11/30/2020 CLINICAL DATA:  Chest pain, shortness of breath, chills EXAM: CHEST - 2 VIEW COMPARISON:  Chest radiograph and CTA chest 11/26/2010 FINDINGS: Chest wall port is stable in position. The cardiomediastinal silhouette is stable. Patchy opacity projecting over the left mid lung is unchanged. There is no new or progressive airspace disease. There is no pleural effusion or pneumothorax. The bones are stable. IMPRESSION: Unchanged masslike opacity projecting over the left midlung. No new or progressive airspace disease Electronically Signed   By: Valetta Mole M.D.   On: 11/30/2020 10:19   DG Chest 2 View  Result Date: 11/25/2020 CLINICAL DATA:  Chest pain EXAM: CHEST - 2 VIEW COMPARISON:  11/13/2020 FINDINGS: Masslike consolidation within the left lung base is again seen and appears stable since prior examination. Right lung is clear. No pneumothorax or pleural effusion. Right internal jugular chest port is seen with its tip at the superior right atrium. Cardiac size within normal limits. No acute bone abnormality. IMPRESSION: Stable masslike consolidation within the left lower lobe. Electronically Signed   By: Fidela Salisbury M.D.   On: 11/25/2020 01:29   DG Chest 2 View  Result Date: 11/13/2020 CLINICAL DATA:  A 70 year old male presents with chest pain in the  setting of ongoing systemic therapy for pulmonary neoplasm. EXAM: CHEST - 2 VIEW COMPARISON:  October 30, 2020. FINDINGS: RIGHT Port-A-Cath in-situ, tip at the upper portion of the RIGHT atrium as on previous imaging. Stable LEFT perihilar masslike area with no new areas of consolidation. No visible pneumothorax. No sign of pleural effusion. On limited assessment no acute skeletal process. IMPRESSION: Stable chest x-ray. No acute changes with unchanged appearance of LEFT hilar distortion and masslike appearance. Electronically Signed   By: Zetta Bills M.D.   On: 11/13/2020 14:06   CT Angio Chest PE W and/or Wo  Contrast  Result Date: 11/30/2020 CLINICAL DATA:  Chest pain, shortness of breath EXAM: CT ANGIOGRAPHY CHEST WITH CONTRAST TECHNIQUE: Multidetector CT imaging of the chest was performed using the standard protocol during bolus administration of intravenous contrast. Multiplanar CT image reconstructions and MIPs were obtained to evaluate the vascular anatomy. CONTRAST:  33mL OMNIPAQUE IOHEXOL 350 MG/ML SOLN COMPARISON:  11/25/2020 FINDINGS: Cardiovascular: Right IJ port catheter extends to right atrium. Heart size upper limits normal. Good contrast opacification of the pulmonary arterial tree. No definite acute PE, with significant image degradation through the lung bases secondary to confirm continued patient breathing motion during the study. Good contrast opacification of the thoracic aorta without dissection, aneurysm, or stenosis. Bovine variant brachiocephalic arterial origin anatomy without proximal stenosis. No significant atheromatous change. Mediastinum/Nodes: 1 cm pretracheal lymph node. Subcarinal adenopathy stable. 6.6 cm left hilar mass involving and occluding branches of the left pulmonary veins, abutting upper lower lobe bronchi, with central air bronchograms. A Lungs/Pleura: Left hilar mass extending into the inferior lingula and left lower lobe. Right lung clear. Some image  degradation secondary to continued breathing during the acquisition. Upper Abdomen: Renal cysts.  No acute findings. Musculoskeletal: No chest wall abnormality. No acute or significant osseous findings. Review of the MIP images confirms the above findings. IMPRESSION: 1. Negative for acute PE or thoracic aortic dissection. 2. Persistent left hilar masslike process involving branches of the left pulmonary veins and extending to involve the inferior lingula and left lower lobe, suspicious for neoplasm, as has been previously described 3. No evidence of distal metastatic disease. Electronically Signed   By: Lucrezia Europe M.D.   On: 11/30/2020 12:40   CT Angio Chest PE W and/or Wo Contrast  Result Date: 11/25/2020 CLINICAL DATA:  Three-month history of chest pain. History of lung cancer. EXAM: CT ANGIOGRAPHY CHEST WITH CONTRAST TECHNIQUE: Multidetector CT imaging of the chest was performed using the standard protocol during bolus administration of intravenous contrast. Multiplanar CT image reconstructions and MIPs were obtained to evaluate the vascular anatomy. CONTRAST:  55mL OMNIPAQUE IOHEXOL 350 MG/ML SOLN COMPARISON:  11/13/2020 FINDINGS: Cardiovascular: The heart is mildly enlarged but stable. No pericardial effusion. Mild tortuosity of the thoracic aorta but no aneurysm or dissection. Minimal atherosclerotic calcification at the aortic arch. The branch vessels are normal. No obvious coronary artery calcifications. The pulmonary arterial tree is fairly well opacified. No findings suspicious for pulmonary embolism. Extensive tumor surrounding the left pulmonary arteries. Mediastinum/Nodes: Persistent ill-defined soft tissue density involving the left hilar region and subcarinal space likely infiltrating tumor. Small stable right-sided hilar lymph nodes. Lungs/Pleura: Persistent and slightly progressive ill-defined soft tissue density involving the left hilum and left paramediastinal lung surrounding the left  mainstem bronchus and left pulmonary artery consistent with recurrent tumor. There is also surrounding interstitial thickening and vague nodularity the left upper lobe and left lower lobe which could be obstructive pneumonitis or interstitial spread of tumor. The right lung remains relatively clear except for streaky right basilar atelectasis. No pulmonary lesions. No pleural effusions. Upper Abdomen: No significant upper abdominal findings. No hepatic or adrenal gland lesions. No upper abdominal adenopathy. Bilateral renal cysts are noted. Musculoskeletal: No significant bony findings. Review of the MIP images confirms the above findings. IMPRESSION: 1. No CT findings for pulmonary embolism. 2. Persistent and slightly progressive ill-defined soft tissue density involving the left hilum and left paramediastinal lung most consistent with recurrent tumor. 3. Surrounding interstitial thickening and vague nodularity in the left upper lobe and left lower lobe  could be obstructive pneumonitis or interstitial spread of tumor. 4. Left hilar and subcarinal adenopathy. 5. No findings for upper abdominal metastatic disease. Electronically Signed   By: Marijo Sanes M.D.   On: 11/25/2020 05:26   CT Angio Chest PE W and/or Wo Contrast  Result Date: 11/13/2020 CLINICAL DATA:  70 year old male with history of chest pain and shortness of breath. History of lung cancer status post chemotherapy and radiation therapy (complete 10 months ago). Recent diagnosis of COVID infection 10/20/2020. EXAM: CT ANGIOGRAPHY CHEST WITH CONTRAST TECHNIQUE: Multidetector CT imaging of the chest was performed using the standard protocol during bolus administration of intravenous contrast. Multiplanar CT image reconstructions and MIPs were obtained to evaluate the vascular anatomy. CONTRAST:  22mL OMNIPAQUE IOHEXOL 350 MG/ML SOLN COMPARISON:  Multiple priors, most recently chest CTA 11/04/2020. FINDINGS: Cardiovascular: No filling defects within the  pulmonary arterial tree to suggest pulmonary embolism. Heart size is normal. There is no significant pericardial fluid, thickening or pericardial calcification. Aortic atherosclerosis. No definite coronary artery calcifications. Right single-lumen power porta cath with tip terminating in the right atrium. Mediastinum/Nodes: No pathologically enlarged mediastinal or right hilar lymph nodes. Prominent soft tissue in the left hilar region, similar to prior studies. Esophagus is unremarkable in appearance. No axillary lymphadenopathy. Lungs/Pleura: Increasingly bulky mass-like area of architectural distortion in the left lung, most evident in the central aspect of the left upper lobe which has clearly progressed when compared to prior examinations dating back to 07/17/2020, with increasingly convex margins, concerning for locally recurrent disease. This is contiguous with bulky soft tissue in the left hilar region. Trace left pleural effusion lying dependently. Right lung is clear. No right pleural effusion. Upper Abdomen: 1.1 cm low-attenuation lesion in the upper pole the left kidney is compatible with a simple cyst. Musculoskeletal: There are no aggressive appearing lytic or blastic lesions noted in the visualized portions of the skeleton. Review of the MIP images confirms the above findings. IMPRESSION: 1. No evidence of pulmonary embolism. 2. Increasingly bulky mass-like architectural distortion throughout the left lung, most evident in the central aspect of the left upper lobe where there are increasingly convex margins, concerning for locally recurrent disease. Further evaluation with PET-CT is recommended in the near future to better evaluate these findings. 3. Trace left pleural effusion lying dependently. Electronically Signed   By: Vinnie Langton M.D.   On: 11/13/2020 15:56   ECHOCARDIOGRAM COMPLETE  Result Date: 11/11/2020    ECHOCARDIOGRAM REPORT   Patient Name:   RYNE MCTIGUE Date of Exam: 11/11/2020  Medical Rec #:  [id number]     Height:       70.0 in Accession #:    [id number]    Weight:       174.0 lb Date of Birth:  20-Jun-1950      BSA:          1.967 m Patient Age:    57 years      BP:           130/70 mmHg Patient Gender: M             HR:           104 bpm. Exam Location:  [facility] Procedure: 2D Echo, Cardiac Doppler, Color Doppler and Strain Analysis Indications:     Dyspnea R06.00  History:         Patient has prior history of Echocardiogram examinations, most  recent 01/13/2020. Signs/Symptoms:Dyspnea; Risk                  Factors:Hypertension.  Sonographer:     Sherrie Sport Referring Phys:  [id number] [facility] C RAO Diagnosing Phys: Kate Sable MD IMPRESSIONS  1. Left ventricular ejection fraction, by estimation, is 55 to 60%. The left ventricle has normal function. The left ventricle has no regional wall motion abnormalities. Left ventricular diastolic parameters are consistent with Grade I diastolic dysfunction (impaired relaxation).  2. Right ventricular systolic function is normal. The right ventricular size is normal.  3. The mitral valve is normal in structure. No evidence of mitral valve regurgitation.  4. The aortic valve is tricuspid. Aortic valve regurgitation is mild. Mild aortic valve sclerosis is present, with no evidence of aortic valve stenosis. FINDINGS  Left Ventricle: Left ventricular ejection fraction, by estimation, is 55 to 60%. The left ventricle has normal function. The left ventricle has no regional wall motion abnormalities. Global longitudinal strain performed but not reported based on interpreter judgement due to suboptimal tracking. 3D left ventricular ejection fraction analysis performed but not reported based on interpreter judgement due to suboptimal quality. The left ventricular internal cavity size was normal in size. There is no left ventricular hypertrophy. Left ventricular diastolic parameters are consistent with Grade I diastolic dysfunction (impaired  relaxation). Right Ventricle: The right ventricular size is normal. No increase in right ventricular wall thickness. Right ventricular systolic function is normal. Left Atrium: Left atrial size was normal in size. Right Atrium: Right atrial size was normal in size. Pericardium: There is no evidence of pericardial effusion. Mitral Valve: The mitral valve is normal in structure. No evidence of mitral valve regurgitation. Tricuspid Valve: The tricuspid valve is normal in structure. Tricuspid valve regurgitation is not demonstrated. Aortic Valve: The aortic valve is tricuspid. Aortic valve regurgitation is mild. Mild aortic valve sclerosis is present, with no evidence of aortic valve stenosis. Aortic valve mean gradient measures 3.0 mmHg. Aortic valve peak gradient measures 5.1 mmHg. Aortic valve area, by VTI measures 3.44 cm. Pulmonic Valve: The pulmonic valve was normal in structure. Pulmonic valve regurgitation is not visualized. Aorta: The aortic root is normal in size and structure. Venous: The inferior vena cava was not well visualized. IAS/Shunts: No atrial level shunt detected by color flow Doppler.  LEFT VENTRICLE PLAX 2D LVIDd:         3.80 cm  Diastology LVIDs:         2.40 cm  LV e' medial:    5.00 cm/s LV PW:         1.00 cm  LV E/e' medial:  10.9 LV IVS:        1.10 cm  LV e' lateral:   5.00 cm/s LVOT diam:     2.10 cm  LV E/e' lateral: 10.9 LV SV:         46 LV SV Index:   23 LVOT Area:     3.46 cm                          3D Volume EF:                         3D EF:        52 %                         LV EDV:  202 ml                         LV ESV:       98 ml                         LV SV:        104 ml RIGHT VENTRICLE RV Basal diam:  2.30 cm RV S prime:     15.70 cm/s TAPSE (M-mode): 4.1 cm LEFT ATRIUM             Index       RIGHT ATRIUM           Index LA diam:        3.40 cm 1.73 cm/m  RA Area:     15.70 cm LA Vol (A2C):   42.7 ml 21.70 ml/m RA Volume:   34.90 ml  17.74 ml/m LA Vol (A4C):    23.7 ml 12.05 ml/m LA Biplane Vol: 32.4 ml 16.47 ml/m  AORTIC VALVE                   PULMONIC VALVE AV Area (Vmax):    2.76 cm    PV Vmax:        0.59 m/s AV Area (Vmean):   2.59 cm    PV Peak grad:   1.4 mmHg AV Area (VTI):     3.44 cm    RVOT Peak grad: 3 mmHg AV Vmax:           113.00 cm/s AV Vmean:          78.600 cm/s AV VTI:            0.134 m AV Peak Grad:      5.1 mmHg AV Mean Grad:      3.0 mmHg LVOT Vmax:         90.10 cm/s LVOT Vmean:        58.800 cm/s LVOT VTI:          0.133 m LVOT/AV VTI ratio: 0.99  AORTA Ao Root diam: 3.60 cm MITRAL VALVE                TRICUSPID VALVE MV Area (PHT): 3.91 cm     TR Peak grad:   14.6 mmHg MV Decel Time: 194 msec     TR Vmax:        191.00 cm/s MV E velocity: 54.40 cm/s MV A velocity: 101.00 cm/s  SHUNTS MV E/A ratio:  0.54         Systemic VTI:  0.13 m                             Systemic Diam: 2.10 cm Kate Sable MD Electronically signed by Kate Sable MD Signature Date/Time: 11/11/2020/5:54:58 PM    Final     PERFORMANCE STATUS (ECOG) : 1 - Symptomatic but completely ambulatory  Review of Systems Unless otherwise noted, a complete review of systems is negative.  Physical Exam General: NAD Cardiovascular: regular rate and rhythm Pulmonary: clear ant fields Abdomen: soft, nontender, + bowel sounds GU: no suprapubic tenderness Extremities: no edema, no joint deformities Skin: no rashes Neurological: Weakness but otherwise nonfocal  Assessment and Plan- Patient is a 70 y.o. male stage IIIb adenocarcinoma of the lung status post concurrent chemo/radiation now on maintenance immunotherapy who presents to [facility] for  evaluation of pain   Neoplasm related pain-patient had extensive work-up in the hospital including cardiac and GI work-up with etiology of pain felt to be related to recurrence of his hilar mass.  His pain seems somewhat disproportionate to his exam and imaging. Patient is receiving XRT and pain should hopefully improve in  time.  Discussed medical management with patient and sister.  We will increase MS Contin 30 mg to every 8 hour dosing and oxycodone 20 mg every 3-4 hours as needed for breakthrough pain.    Anxiety  -I do feel that patient has a significant amount of anxiety and it is hard to tell if his primary symptomatic complaint is pain versus anxiety.  Patient appeared significantly anxious and endorsed claustrophobia in the exam room.  I had started patient on lorazepam in the hospital and this seemed to help but it does not appear that this medication was continued at time of discharge.  Will restart lorazepam and also start him on Cymbalta for anxiety/neuropathic pain.  Case and plan discussed with Dr. Janese Banks. RTC next week or sooner if needed.  Patient expressed understanding and was in agreement with this plan. He also understands that He can call clinic at any time with any questions, concerns, or complaints.   Thank you for allowing me to participate in the care of this very pleasant patient.   Time Total: 25 minutes  Visit consisted of counseling and education dealing with the complex and emotionally intense issues of symptom management and palliative care in the setting of serious and potentially life-threatening illness.Greater than 50%  of this time was spent counseling and coordinating care related to the above assessment and plan.  Signed by: Altha Harm, PhD, NP-C

## 2020-12-09 NOTE — Telephone Encounter (Signed)
Lm for update.  

## 2020-12-09 NOTE — Progress Notes (Signed)
Pt presents to [facility] for uncontrolled pain. He currently reports pain to his chest 7/10. He also asked writer to open the door to the exam room because he feels like "things are closing in on him". He reports that he has always had claustrophobia. He appears anxious with tachypnea. Pt encouraged to take slow deep breaths.

## 2020-12-09 NOTE — Telephone Encounter (Signed)
  Component Ref Range & Units 4 d ago 5 d ago 6 d ago 7 d ago  Potassium 3.5 - 5.1 mmol/L 3.8  3.6  3.7  3.8

## 2020-12-10 ENCOUNTER — Ambulatory Visit
Admission: RE | Admit: 2020-12-10 | Discharge: 2020-12-10 | Disposition: A | Discharge status: Home / Self Care | Payer: Medicare Other | Source: Ambulatory Visit | Attending: Radiation Oncology | Admitting: Radiation Oncology

## 2020-12-10 ENCOUNTER — Inpatient Hospital Stay: Payer: Medicare Other

## 2020-12-10 DIAGNOSIS — Z5111 Encounter for antineoplastic chemotherapy: Secondary | ICD-10-CM | POA: Diagnosis not present

## 2020-12-10 NOTE — Telephone Encounter (Signed)
Can arrange for follow-up on 3 November.

## 2020-12-10 NOTE — Telephone Encounter (Signed)
Patient was not seen on 12/09/2020. He did not call back with transportation update.  Dr. Patsey Berthold, please advise where you would like to fit patient into the schedule at. Thanks

## 2020-12-10 NOTE — Telephone Encounter (Signed)
Will schedule once 12/17/2020 schedule has been opened.

## 2020-12-11 ENCOUNTER — Other Ambulatory Visit: Payer: Self-pay | Admitting: *Deleted

## 2020-12-11 ENCOUNTER — Encounter: Payer: Self-pay | Admitting: Oncology

## 2020-12-11 ENCOUNTER — Inpatient Hospital Stay: Payer: Medicare Other

## 2020-12-11 ENCOUNTER — Inpatient Hospital Stay (HOSPITAL_BASED_OUTPATIENT_CLINIC_OR_DEPARTMENT_OTHER): Payer: Medicare Other | Admitting: Oncology

## 2020-12-11 ENCOUNTER — Other Ambulatory Visit: Payer: Self-pay

## 2020-12-11 ENCOUNTER — Ambulatory Visit
Admission: RE | Admit: 2020-12-11 | Discharge: 2020-12-11 | Disposition: A | Discharge status: Home / Self Care | Payer: Medicare Other | Source: Ambulatory Visit | Attending: Radiation Oncology | Admitting: Radiation Oncology

## 2020-12-11 VITALS — BP 112/77 | HR 98 | Temp 98.2°F | Resp 20 | Wt 164.0 lb

## 2020-12-11 DIAGNOSIS — Z5111 Encounter for antineoplastic chemotherapy: Secondary | ICD-10-CM | POA: Diagnosis not present

## 2020-12-11 DIAGNOSIS — G893 Neoplasm related pain (acute) (chronic): Secondary | ICD-10-CM

## 2020-12-11 DIAGNOSIS — C3432 Malignant neoplasm of lower lobe, left bronchus or lung: Secondary | ICD-10-CM | POA: Diagnosis not present

## 2020-12-11 DIAGNOSIS — E538 Deficiency of other specified B group vitamins: Secondary | ICD-10-CM

## 2020-12-11 LAB — COMPREHENSIVE METABOLIC PANEL
ALT: 22 U/L (ref 0–44)
AST: 15 U/L (ref 15–41)
Albumin: 2.7 g/dL — ABNORMAL LOW (ref 3.5–5.0)
Alkaline Phosphatase: 56 U/L (ref 38–126)
Anion gap: 7 (ref 5–15)
BUN: 16 mg/dL (ref 8–23)
CO2: 24 mmol/L (ref 22–32)
Calcium: 8.8 mg/dL — ABNORMAL LOW (ref 8.9–10.3)
Chloride: 101 mmol/L (ref 98–111)
Creatinine, Ser: 0.62 mg/dL (ref 0.61–1.24)
GFR, Estimated: >60 mL/min (ref >60)
Glucose, Bld: 109 mg/dL — ABNORMAL HIGH (ref 70–99)
Potassium: 4.1 mmol/L (ref 3.5–5.1)
Sodium: 132 mmol/L — ABNORMAL LOW (ref 135–145)
Total Bilirubin: 0.6 mg/dL (ref 0.3–1.2)
Total Protein: 6.7 g/dL (ref 6.5–8.1)

## 2020-12-11 LAB — CBC WITH DIFFERENTIAL/PLATELET
Abs Immature Granulocytes: 0.03 10*3/uL (ref 0.00–0.07)
Basophils Absolute: 0 10*3/uL (ref 0.0–0.1)
Basophils Relative: 0 %
Eosinophils Absolute: 0 10*3/uL (ref 0.0–0.5)
Eosinophils Relative: 0 %
HCT: 31.1 % — ABNORMAL LOW (ref 39.0–52.0)
Hemoglobin: 9.6 g/dL — ABNORMAL LOW (ref 13.0–17.0)
Immature Granulocytes: 0 %
Lymphocytes Relative: 7 %
Lymphs Abs: 0.6 10*3/uL — ABNORMAL LOW (ref 0.7–4.0)
MCH: 24.2 pg — ABNORMAL LOW (ref 26.0–34.0)
MCHC: 30.9 g/dL (ref 30.0–36.0)
MCV: 78.3 fL — ABNORMAL LOW (ref 80.0–100.0)
Monocytes Absolute: 0.8 10*3/uL (ref 0.1–1.0)
Monocytes Relative: 10 %
Neutro Abs: 6.3 10*3/uL (ref 1.7–7.7)
Neutrophils Relative %: 83 %
Platelets: 574 10*3/uL — ABNORMAL HIGH (ref 150–400)
RBC: 3.97 MIL/uL — ABNORMAL LOW (ref 4.22–5.81)
RDW: 18.7 % — ABNORMAL HIGH (ref 11.5–15.5)
WBC: 7.8 10*3/uL (ref 4.0–10.5)
nRBC: 0 % (ref 0.0–0.2)

## 2020-12-11 MED ORDER — HEPARIN SOD (PORK) LOCK FLUSH 100 UNIT/ML IV SOLN
INTRAVENOUS | Status: AC
  Administered 2020-12-11: 500 [IU]
  Filled 2020-12-11: qty 5

## 2020-12-11 MED ORDER — PALONOSETRON HCL INJECTION 0.25 MG/5ML
0.2500 mg | Freq: Once | INTRAVENOUS | Status: AC
  Administered 2020-12-11: 0.25 mg via INTRAVENOUS
  Filled 2020-12-11: qty 5

## 2020-12-11 MED ORDER — SODIUM CHLORIDE 0.9 % IV SOLN
150.0000 mg | Freq: Once | INTRAVENOUS | Status: AC
  Administered 2020-12-11: 150 mg via INTRAVENOUS
  Filled 2020-12-11: qty 150

## 2020-12-11 MED ORDER — SODIUM CHLORIDE 0.9 % IV SOLN
10.0000 mg | Freq: Once | INTRAVENOUS | Status: AC
  Administered 2020-12-11: 10 mg via INTRAVENOUS
  Filled 2020-12-11: qty 10

## 2020-12-11 MED ORDER — CYANOCOBALAMIN 1000 MCG/ML IJ SOLN
1000.0000 ug | INTRAMUSCULAR | Status: DC
  Administered 2020-12-11: 1000 ug via INTRAMUSCULAR
  Filled 2020-12-11: qty 1

## 2020-12-11 MED ORDER — MORPHINE SULFATE (PF) 2 MG/ML IV SOLN
4.0000 mg | Freq: Once | INTRAVENOUS | Status: AC
  Administered 2020-12-11: 4 mg via INTRAVENOUS
  Filled 2020-12-11: qty 2

## 2020-12-11 MED ORDER — SODIUM CHLORIDE 0.9 % IV SOLN
500.0000 mg/m2 | Freq: Once | INTRAVENOUS | Status: AC
  Administered 2020-12-11: 1000 mg via INTRAVENOUS
  Filled 2020-12-11: qty 40

## 2020-12-11 MED ORDER — HEPARIN SOD (PORK) LOCK FLUSH 100 UNIT/ML IV SOLN
500.0000 [IU] | Freq: Once | INTRAVENOUS | Status: AC | PRN
  Filled 2020-12-11: qty 5

## 2020-12-11 MED ORDER — SODIUM CHLORIDE 0.9 % IV SOLN
494.5000 mg | Freq: Once | INTRAVENOUS | Status: AC
  Administered 2020-12-11: 490 mg via INTRAVENOUS
  Filled 2020-12-11: qty 49

## 2020-12-11 MED ORDER — SODIUM CHLORIDE 0.9 % IV SOLN
Freq: Once | INTRAVENOUS | Status: AC
  Filled 2020-12-11: qty 250

## 2020-12-11 NOTE — Telephone Encounter (Signed)
Spoke to pharmacist at [facility] and they do not compound magic mouthwash.   Faxed prescription to [facility] at 3:52 pm. Fax confirmation received.

## 2020-12-11 NOTE — Progress Notes (Signed)
Patient here for follow up with labs and treatment today. Patient's weight is down today. He states that he only eats 1 meal a day, and that his appetite is down. He denies having any pain. He has shortness of breath with minimal exertion.

## 2020-12-11 NOTE — Patient Instructions (Signed)
[facility]  Discharge Instructions: Thank you for choosing [facility] to provide your oncology and hematology care.  If you have a lab appointment with the [facility], please go directly to the [facility] and check in at the registration area.  Wear comfortable clothing and clothing appropriate for easy access to any Portacath or PICC line.   We strive to give you quality time with your provider. You may need to reschedule your appointment if you arrive late (15 or more minutes).  Arriving late affects you and other patients whose appointments are after yours.  Also, if you miss three or more appointments without notifying the office, you may be dismissed from the clinic at the provider's discretion.      For prescription refill requests, have your pharmacy contact our office and allow 72 hours for refills to be completed.    Today you received the following chemotherapy and/or immunotherapy agents Alimta & Carboplatin    To help prevent nausea and vomiting after your treatment, we encourage you to take your nausea medication as directed.  BELOW ARE SYMPTOMS THAT SHOULD BE REPORTED IMMEDIATELY: *FEVER GREATER THAN 100.4 F (38 C) OR HIGHER *CHILLS OR SWEATING *NAUSEA AND VOMITING THAT IS NOT CONTROLLED WITH YOUR NAUSEA MEDICATION *UNUSUAL SHORTNESS OF BREATH *UNUSUAL BRUISING OR BLEEDING *URINARY PROBLEMS (pain or burning when urinating, or frequent urination) *BOWEL PROBLEMS (unusual diarrhea, constipation, pain near the anus) TENDERNESS IN MOUTH AND THROAT WITH OR WITHOUT PRESENCE OF ULCERS (sore throat, sores in mouth, or a toothache) UNUSUAL RASH, SWELLING OR PAIN  UNUSUAL VAGINAL DISCHARGE OR ITCHING   Items with * indicate a potential emergency and should be followed up as soon as possible or go to the Emergency Department if any problems should occur.  Please show the CHEMOTHERAPY ALERT CARD or IMMUNOTHERAPY ALERT CARD at  check-in to the Emergency Department and triage nurse.  Should you have questions after your visit or need to cancel or reschedule your appointment, please contact [facility]  [phone] and follow the prompts.  Office hours are 8:00 a.m. to 4:30 p.m. Monday - Friday. Please note that voicemails left after 4:00 p.m. may not be returned until the following business day.  We are closed weekends and major holidays. You have access to a nurse at all times for urgent questions. Please call the main number to the clinic [phone] and follow the prompts.  For any non-urgent questions, you may also contact your provider using MyChart. We now offer e-Visits for anyone 60 and older to request care online for non-urgent symptoms. For details visit [email or web address].   Also download the MyChart app! Go to the app store, search "MyChart", open the app, select [facility], and log in with your MyChart username and password.  Due to Covid, a mask is required upon entering the hospital/clinic. If you do not have a mask, one will be given to you upon arrival. For doctor visits, patients may have 1 support person aged 57 or older with them. For treatment visits, patients cannot have anyone with them due to current Covid guidelines and our immunocompromised population.

## 2020-12-11 NOTE — Telephone Encounter (Signed)
Prescription for Magic Medicated Mouthwash faxed to [facility] drug store per MD request.

## 2020-12-11 NOTE — Progress Notes (Signed)
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  He underwent CT angios chest which did not show any PE.  Soft tissue attenuation measuring 2.9 x 4 cm centered in the left hilum resulting in abrupt angulation and narrowing of the pulmonary arteries and the central left lower lobe airways.  Additional ipsilateral hilar and subcarinal adenopathy.  No contralateral adenopathy.  Consolidative masslike opacity 3.6 x 3.1 cm in size and contiguous with more central perihilar soft tissue attenuation.  Overall findings concerning for primary bronchogenic carcinoma.    Patient lives with his sister who is his main caregiver.  He does not drive but is independent of his ADLs.  Reports ongoing fatigue and occasional retrosternal chest pain.  He has been evaluated by GI in the past for reflux as well.  Appetie is fair and weight is stable.  Denies any significant shortness of breath at this time.  Patient is an ex-smoker and smoked for about 20 years but quit smoking back in 1994   PET CT scan showed hypermetabolic mass in the left lower lobe with mediastinal adenopathy and right paratracheal adenopathy.  No evidence of distant metastatic disease.  Pathology was consistent with non-small cell lung cancer favor adenocarcinoma.  Cells were positive for TTF-1 and  negative for p40.   Patient started concurrent carbotaxol radiation and then had allergic reaction to Taxol for which she was switched to Abraxane.  Given the short supply of Abraxane patient received carbo Alimta midway through his concurrent chemoradiation.  Scans post chemoradiation showed partial response and patient will be started on maintenance durvalumab on 01/14/2020.  Patient was on maintenance durvalumab and received 15 cycles up until August 2022.  He was noted to have worsening chest pain and was in the ER multiple times in September 2022.  Repeat CT scan showed concern for hilar recurrence.  Interval history- States that his chest pain is slowly getting better.  He is unsure as to how much pain medication he is taking and its mainly his sister who gives him his medications.  He thinks he may be using breakthrough oxycodone about 4 times a day.  Last bowel movement was about 2 days ago.  ECOG PS- 1 Pain scale- 5 Opioid associated constipation- no  Review of systems- Review of Systems  Constitutional:  Positive for malaise/fatigue. Negative for chills, fever and weight loss.  HENT:  Negative for congestion, ear discharge and nosebleeds.   Eyes:  Negative for blurred vision.  Respiratory:  Negative for cough, hemoptysis, sputum production, shortness of breath and wheezing.   Cardiovascular:  Positive for chest pain. Negative for palpitations, orthopnea and claudication.  Gastrointestinal:  Negative for abdominal pain, blood in stool, constipation, diarrhea, heartburn, melena, nausea and vomiting.  Genitourinary:  Negative for dysuria, flank pain, frequency, hematuria and urgency.  Musculoskeletal:  Negative for back pain, joint pain and myalgias.  Skin:  Negative for  rash.  Neurological:  Negative for dizziness, tingling, focal weakness, seizures, weakness and headaches.  Endo/Heme/Allergies:  Does not bruise/bleed easily.  Psychiatric/Behavioral:  Negative for depression and  suicidal ideas. The patient does not have insomnia.      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 ([place])      Past Surgical History:  Procedure Laterality Date   ESOPHAGOGASTRODUODENOSCOPY (EGD) WITH PROPOFOL N/A 12/01/2020   Procedure: ESOPHAGOGASTRODUODENOSCOPY (EGD) WITH PROPOFOL;  Surgeon: Lin Landsman, MD;  Location: [facility] ENDOSCOPY;  Service: Gastroenterology;  Laterality: N/A;   PORTA CATH INSERTION N/A 10/22/2019   Procedure: PORTA CATH INSERTION;  Surgeon: Katha Cabal, MD;  Location: [facility];  Service: Cardiovascular;  Laterality: N/A;   VIDEO BRONCHOSCOPY WITH ENDOBRONCHIAL ULTRASOUND N/A 09/25/2019   Procedure: VIDEO BRONCHOSCOPY WITH ENDOBRONCHIAL ULTRASOUND;  Surgeon: Tyler Pita, MD;  Location: [facility] ORS;  Service: Pulmonary;  Laterality: N/A;    Social History   Socioeconomic History   Marital status: Single    Spouse name: Not on file   Number of children: Not on file   Years of education: Not on file   Highest education level: Not on file  Occupational History   Not on file  Tobacco Use   Smoking status: Former    Packs/day: 1.00    Years: 20.00    Pack years: 20.00    Types: Cigarettes    Quit date: 37    Years since quitting: 28.8   Smokeless tobacco: Never  Vaping Use   Vaping Use: Never used  Substance and Sexual Activity   Alcohol use: Not Currently    Comment: not drank any beer in 2 onths   Drug use: Never   Sexual activity: Not on file  Other Topics Concern   Not on file  Social History Narrative   Not on file   Social Determinants of Health   Financial Resource Strain: Not on file  Food Insecurity: Not on file  Transportation Needs: Not on file  Physical Activity: Not on file   Stress: Not on file  Social Connections: Not on file  Intimate Partner Violence: Not on file    Family History  Problem Relation Age of Onset   Cancer Brother      Current Outpatient Medications:    albuterol (VENTOLIN HFA) 108 (90 Base) MCG/ACT inhaler, Inhale 2 puffs into the lungs every 4 (four) hours as needed for wheezing or shortness of breath., Disp: 8 g, Rfl: 1   Budeson-Glycopyrrol-Formoterol (BREZTRI AEROSPHERE) 160-9-4.8 MCG/ACT AERO, Inhale 2 puffs into the lungs in the morning and at bedtime., Disp: 10.7 g, Rfl: 5   clobetasol cream (TEMOVATE) 0.05 %, Apply topically., Disp: , Rfl:    DULoxetine (CYMBALTA) 30 MG capsule, Take 1 capsule (30 mg total) by mouth daily., Disp: 30 capsule, Rfl: 3   hydrOXYzine (ATARAX/VISTARIL) 25 MG tablet, Take 25 mg by mouth daily., Disp: , Rfl:    lidocaine (LIDODERM) 5 %, Place 1 patch onto the skin daily. Remove & Discard patch within 12 hours or as directed by MD, Disp: 30 patch, Rfl: 0   lidocaine (XYLOCAINE) 2 % solution, Use as directed 15 mLs in the mouth or throat every 6 (six)  hours as needed (abdominal pain)., Disp: 100 mL, Rfl: 0   lidocaine-prilocaine (EMLA) cream, Apply 1 application topically as needed., Disp: 30 g, Rfl: 0   LORazepam (ATIVAN) 0.5 MG tablet, Take 1 tablet (0.5 mg total) by mouth every 8 (eight) hours as needed for anxiety., Disp: 30 tablet, Rfl: 0   morphine (MS CONTIN) 30 MG 12 hr tablet, Take 1 tablet (30 mg total) by mouth every 8 (eight) hours., Disp: 60 tablet, Rfl: 0   naloxegol oxalate (MOVANTIK) 25 MG TABS tablet, Take 1 tablet (25 mg total) by mouth daily., Disp: 30 tablet, Rfl: 0   naloxone (NARCAN) nasal spray 4 mg/0.1 mL, SPRAY 1 SPRAY INTO ONE NOSTRIL AS DIRECTED FOR OPIOID OVERDOSE (TURN PERSON ON SIDE AFTER DOSE. IF NO RESPONSE IN 2-3 MINUTES OR PERSON RESPONDS BUT RELAPSES, REPEAT USING A NEW SPRAY DEVICE AND SPRAY INTO THE OTHER NOSTRIL. CALL 911 AFTER USE.) * EMERGENCY USE ONLY *, Disp: 1 each, Rfl:  0   Oxycodone HCl 20 MG TABS, Take 1 tablet (20 mg total) by mouth every 3 (three) hours as needed (for breakthrough pain)., Disp: 60 tablet, Rfl: 0   pantoprazole (PROTONIX) 40 MG tablet, Take 1 tablet (40mg ) twice daily for two weeks and then take 1 tablet daily thereafter, Disp: 60 tablet, Rfl: 3   potassium chloride SA (KLOR-CON) 20 MEQ tablet, TAKE 1 TABLET BY MOUTH TWICE DAILY, Disp: 30 tablet, Rfl: 1   sucralfate (CARAFATE) 1 g tablet, Take 1 tablet (1 g total) by mouth 4 (four) times daily., Disp: 60 tablet, Rfl: 0   acetaminophen (TYLENOL) 500 MG tablet, Take 1,000 mg by mouth every 6 (six) hours as needed for mild pain, moderate pain or headache. (Patient not taking: No sig reported), Disp: , Rfl:  No current facility-administered medications for this visit.  Facility-Administered Medications Ordered in Other Visits:    cyanocobalamin ((VITAMIN B-12)) injection 1,000 mcg, 1,000 mcg, Intramuscular, Q30 days, Sindy Guadeloupe, MD, 1,000 mcg at 12/11/20 1237  Physical exam:  Vitals:   12/11/20 1107  BP: 112/77  Pulse: 98  Resp: 20  Temp: 98.2 F (36.8 C)  TempSrc: Tympanic  SpO2: 99%  Weight: 164 lb (74.4 kg)   Physical Exam Constitutional:      General: He is not in acute distress.    Comments: Appears anxious  Cardiovascular:     Rate and Rhythm: Regular rhythm. Tachycardia present.     Heart sounds: Normal heart sounds.  Pulmonary:     Effort: Pulmonary effort is normal.     Breath sounds: Normal breath sounds.  Abdominal:     General: Bowel sounds are normal.     Palpations: Abdomen is soft.  Skin:    General: Skin is warm and dry.  Neurological:     Mental Status: He is alert and oriented to person, place, and time.     CMP Latest Ref Rng & Units 12/11/2020  Glucose 70 - 99 mg/dL 109(H)  BUN 8 - 23 mg/dL 16  Creatinine 0.61 - 1.24 mg/dL 0.62  Sodium 135 - 145 mmol/L 132(L)  Potassium 3.5 - 5.1 mmol/L 4.1  Chloride 98 - 111 mmol/L 101  CO2 22 - 32 mmol/L 24   Calcium 8.9 - 10.3 mg/dL 8.8(L)  Total Protein 6.5 - 8.1 g/dL 6.7  Total Bilirubin 0.3 - 1.2 mg/dL 0.6  Alkaline Phos 38 - 126 U/L 56  AST 15 - 41 U/L 15  ALT 0 - 44 U/L 22   CBC  Latest Ref Rng & Units 12/11/2020  WBC 4.0 - 10.5 K/uL 7.8  Hemoglobin 13.0 - 17.0 g/dL 9.6(L)  Hematocrit 39.0 - 52.0 % 31.1(L)  Platelets 150 - 400 K/uL 574(H)    No images are attached to the encounter.  DG Chest 2 View  Result Date: 11/30/2020 CLINICAL DATA:  Chest pain, shortness of breath, chills EXAM: CHEST - 2 VIEW COMPARISON:  Chest radiograph and CTA chest 11/26/2010 FINDINGS: Chest wall port is stable in position. The cardiomediastinal silhouette is stable. Patchy opacity projecting over the left mid lung is unchanged. There is no new or progressive airspace disease. There is no pleural effusion or pneumothorax. The bones are stable. IMPRESSION: Unchanged masslike opacity projecting over the left midlung. No new or progressive airspace disease Electronically Signed   By: Valetta Mole M.D.   On: 11/30/2020 10:19   DG Chest 2 View  Result Date: 11/25/2020 CLINICAL DATA:  Chest pain EXAM: CHEST - 2 VIEW COMPARISON:  11/13/2020 FINDINGS: Masslike consolidation within the left lung base is again seen and appears stable since prior examination. Right lung is clear. No pneumothorax or pleural effusion. Right internal jugular chest port is seen with its tip at the superior right atrium. Cardiac size within normal limits. No acute bone abnormality. IMPRESSION: Stable masslike consolidation within the left lower lobe. Electronically Signed   By: Fidela Salisbury M.D.   On: 11/25/2020 01:29   DG Chest 2 View  Result Date: 11/13/2020 CLINICAL DATA:  A 70 year old male presents with chest pain in the setting of ongoing systemic therapy for pulmonary neoplasm. EXAM: CHEST - 2 VIEW COMPARISON:  October 30, 2020. FINDINGS: RIGHT Port-A-Cath in-situ, tip at the upper portion of the RIGHT atrium as on previous  imaging. Stable LEFT perihilar masslike area with no new areas of consolidation. No visible pneumothorax. No sign of pleural effusion. On limited assessment no acute skeletal process. IMPRESSION: Stable chest x-ray. No acute changes with unchanged appearance of LEFT hilar distortion and masslike appearance. Electronically Signed   By: Zetta Bills M.D.   On: 11/13/2020 14:06   CT Angio Chest PE W and/or Wo Contrast  Result Date: 11/30/2020 CLINICAL DATA:  Chest pain, shortness of breath EXAM: CT ANGIOGRAPHY CHEST WITH CONTRAST TECHNIQUE: Multidetector CT imaging of the chest was performed using the standard protocol during bolus administration of intravenous contrast. Multiplanar CT image reconstructions and MIPs were obtained to evaluate the vascular anatomy. CONTRAST:  59mL OMNIPAQUE IOHEXOL 350 MG/ML SOLN COMPARISON:  11/25/2020 FINDINGS: Cardiovascular: Right IJ port catheter extends to right atrium. Heart size upper limits normal. Good contrast opacification of the pulmonary arterial tree. No definite acute PE, with significant image degradation through the lung bases secondary to confirm continued patient breathing motion during the study. Good contrast opacification of the thoracic aorta without dissection, aneurysm, or stenosis. Bovine variant brachiocephalic arterial origin anatomy without proximal stenosis. No significant atheromatous change. Mediastinum/Nodes: 1 cm pretracheal lymph node. Subcarinal adenopathy stable. 6.6 cm left hilar mass involving and occluding branches of the left pulmonary veins, abutting upper lower lobe bronchi, with central air bronchograms. A Lungs/Pleura: Left hilar mass extending into the inferior lingula and left lower lobe. Right lung clear. Some image degradation secondary to continued breathing during the acquisition. Upper Abdomen: Renal cysts.  No acute findings. Musculoskeletal: No chest wall abnormality. No acute or significant osseous findings. Review of the MIP  images confirms the above findings. IMPRESSION: 1. Negative for acute PE or thoracic aortic dissection. 2. Persistent left hilar  masslike process involving branches of the left pulmonary veins and extending to involve the inferior lingula and left lower lobe, suspicious for neoplasm, as has been previously described 3. No evidence of distal metastatic disease. Electronically Signed   By: Lucrezia Europe M.D.   On: 11/30/2020 12:40   CT Angio Chest PE W and/or Wo Contrast  Result Date: 11/25/2020 CLINICAL DATA:  Three-month history of chest pain. History of lung cancer. EXAM: CT ANGIOGRAPHY CHEST WITH CONTRAST TECHNIQUE: Multidetector CT imaging of the chest was performed using the standard protocol during bolus administration of intravenous contrast. Multiplanar CT image reconstructions and MIPs were obtained to evaluate the vascular anatomy. CONTRAST:  62mL OMNIPAQUE IOHEXOL 350 MG/ML SOLN COMPARISON:  11/13/2020 FINDINGS: Cardiovascular: The heart is mildly enlarged but stable. No pericardial effusion. Mild tortuosity of the thoracic aorta but no aneurysm or dissection. Minimal atherosclerotic calcification at the aortic arch. The branch vessels are normal. No obvious coronary artery calcifications. The pulmonary arterial tree is fairly well opacified. No findings suspicious for pulmonary embolism. Extensive tumor surrounding the left pulmonary arteries. Mediastinum/Nodes: Persistent ill-defined soft tissue density involving the left hilar region and subcarinal space likely infiltrating tumor. Small stable right-sided hilar lymph nodes. Lungs/Pleura: Persistent and slightly progressive ill-defined soft tissue density involving the left hilum and left paramediastinal lung surrounding the left mainstem bronchus and left pulmonary artery consistent with recurrent tumor. There is also surrounding interstitial thickening and vague nodularity the left upper lobe and left lower lobe which could be obstructive pneumonitis  or interstitial spread of tumor. The right lung remains relatively clear except for streaky right basilar atelectasis. No pulmonary lesions. No pleural effusions. Upper Abdomen: No significant upper abdominal findings. No hepatic or adrenal gland lesions. No upper abdominal adenopathy. Bilateral renal cysts are noted. Musculoskeletal: No significant bony findings. Review of the MIP images confirms the above findings. IMPRESSION: 1. No CT findings for pulmonary embolism. 2. Persistent and slightly progressive ill-defined soft tissue density involving the left hilum and left paramediastinal lung most consistent with recurrent tumor. 3. Surrounding interstitial thickening and vague nodularity in the left upper lobe and left lower lobe could be obstructive pneumonitis or interstitial spread of tumor. 4. Left hilar and subcarinal adenopathy. 5. No findings for upper abdominal metastatic disease. Electronically Signed   By: Marijo Sanes M.D.   On: 11/25/2020 05:26   CT Angio Chest PE W and/or Wo Contrast  Result Date: 11/13/2020 CLINICAL DATA:  70 year old male with history of chest pain and shortness of breath. History of lung cancer status post chemotherapy and radiation therapy (complete 10 months ago). Recent diagnosis of COVID infection 10/20/2020. EXAM: CT ANGIOGRAPHY CHEST WITH CONTRAST TECHNIQUE: Multidetector CT imaging of the chest was performed using the standard protocol during bolus administration of intravenous contrast. Multiplanar CT image reconstructions and MIPs were obtained to evaluate the vascular anatomy. CONTRAST:  56mL OMNIPAQUE IOHEXOL 350 MG/ML SOLN COMPARISON:  Multiple priors, most recently chest CTA 11/04/2020. FINDINGS: Cardiovascular: No filling defects within the pulmonary arterial tree to suggest pulmonary embolism. Heart size is normal. There is no significant pericardial fluid, thickening or pericardial calcification. Aortic atherosclerosis. No definite coronary artery  calcifications. Right single-lumen power porta cath with tip terminating in the right atrium. Mediastinum/Nodes: No pathologically enlarged mediastinal or right hilar lymph nodes. Prominent soft tissue in the left hilar region, similar to prior studies. Esophagus is unremarkable in appearance. No axillary lymphadenopathy. Lungs/Pleura: Increasingly bulky mass-like area of architectural distortion in the left lung, most evident in the  central aspect of the left upper lobe which has clearly progressed when compared to prior examinations dating back to 07/17/2020, with increasingly convex margins, concerning for locally recurrent disease. This is contiguous with bulky soft tissue in the left hilar region. Trace left pleural effusion lying dependently. Right lung is clear. No right pleural effusion. Upper Abdomen: 1.1 cm low-attenuation lesion in the upper pole the left kidney is compatible with a simple cyst. Musculoskeletal: There are no aggressive appearing lytic or blastic lesions noted in the visualized portions of the skeleton. Review of the MIP images confirms the above findings. IMPRESSION: 1. No evidence of pulmonary embolism. 2. Increasingly bulky mass-like architectural distortion throughout the left lung, most evident in the central aspect of the left upper lobe where there are increasingly convex margins, concerning for locally recurrent disease. Further evaluation with PET-CT is recommended in the near future to better evaluate these findings. 3. Trace left pleural effusion lying dependently. Electronically Signed   By: Vinnie Langton M.D.   On: 11/13/2020 15:56   NM PET Image Restag (PS) Skull Base To Thigh  Result Date: 12/10/2020 CLINICAL DATA:  Follow-up treatment strategy for lung neoplasm. EXAM: NUCLEAR MEDICINE PET SKULL BASE TO THIGH TECHNIQUE: 9.39 mCi F-18 FDG was injected intravenously. Full-ring PET imaging was performed from the skull base to thigh after the radiotracer. CT data was  obtained and used for attenuation correction and anatomic localization. Fasting blood glucose: 112 mg/dl COMPARISON:  Chest CT dated December 01, 2019; PET-CT dated September 16, 2019 FINDINGS: Mediastinal blood pool activity: SUV max 2.5 Liver activity: SUV max NA NECK: No hypermetabolic lymph nodes in the neck. Incidental CT findings: none CHEST: Hypermetabolic lower paratracheal lymph node measuring 8 mm in short axis on series 3, image 74 with an SUV max of 3.7. Hypermetabolic subcarinal lymph node measuring 1.1 cm in short axis on image 94 with an SUV max of 8.5. Hypermetabolic lymph nodes which were previously seen on prior PET-CT are no longer apparent. Left hilar masslike opacity is unchanged in appearance when compared with most recent prior CT and demonstrates hypermetabolic activity, which is most pronounced in the left upper lobe, although the left hilar opacity is incompletely assessed with PET given early termination of exam, SUV max of 10.3. Mildly hypermetabolic irregular left upper lobe solid nodule measuring 1.0 cm on series 3, image 66 with SUV max of 2.7, finding is new compared to prior PET-CT. Incidental CT findings: Right chest wall port with tip near the superior cavoatrial junction. Atherosclerotic disease of the thoracic aorta. ABDOMEN/PELVIS: NA Incidental CT findings: Evaluation the upper abdomen is somewhat limited due to motion artifact. Low-attenuation lesion of the right kidney which is likely a simple cyst. Trace free fluid in the pelvis. Atherosclerotic disease of the abdominal aorta. Cystic lesion of the tail of the pancreas measuring 1.0 cm on image 145, measured 8 mm on prior exam, likely slightly increased in size. Prostatomegaly. SKELETON: No focal hypermetabolic activity of the skull, cervical spine or upper chest. Incidental CT findings: none IMPRESSION: 1. Incomplete exam, terminated early due to patient claustrophobia. PET images only obtained for the neck and upper portion of  the chest. 2. Left hilar masslike opacity demonstrates marked hypermetabolic activity which is concerning for recurrent disease. 3. Hypermetabolic paratracheal and subcarinal lymph nodes which are new compared to prior PET-CT and concerning for metastatic disease. 4. Hypermetabolic lymph nodes which were previously seen on prior PET-CT dated September 16, 2019 are no longer apparent. 5. Mildly hypermetabolic  irregular solid left upper lobe nodule, concerning for additional site of metastatic disease. 6. Aortic Atherosclerosis (ICD10-I70.0). Electronically Signed   By: Yetta Glassman M.D.   On: 12/10/2020 16:19     Assessment and plan- Patient is a 70 y.o. 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  He is here for on treatment assessment prior to cycle 1 of carbo Alimta chemotherapy  Patient received weekly CarboTaxol with radiation back in September 2021.  He had allergic reaction to Taxol but due to shortage of Abraxane he received 1 cycle of carbo Alimta chemotherapy on 11/25/2019.  Since then he was on maintenance durvalumab.  Recently over the last 4 to 6 weeks patient has been having increasing chest pain.  Recent CT scan which was reviewed at tumor board as well Showed concern for hilar recurrence.  We already wanted to get a PET CT scan which she did have 3 days ago but there was considerable motion artifact.  Regardless the hilar mass does appear significantly hypermetabolic on PET scan which is concerning for recurrence.  There were hypermetabolic paratracheal and subcarinal lymph nodes noted as well.  Patient is already receiving palliative radiation for his hilar mass.  I will proceed with systemic chemotherapy at this time and stop durvalumab.  I plan to rechallenge him with carboplatin and Alimta which she received over a year ago but only received 1 cycle back then.  Plan to repeat scans after 3 cycles.  If he has good response to treatment my plan is to do 4 treatments  followed by maintenance Alimta.  Discussed risks and benefits of chemotherapy including all but not limited to nausea, vomiting, low blood counts, risk of infections and hospitalizations.  Patient will also receive B12 injection today and will start folic acid 1 mg p.o. daily.  Neoplasm related pain: Seems to be slowly getting better.  Continue morphineLong-acting 30 mg every 8 hours and oxycodone 20 mg every 3 hours as needed.  He does seem to have a component of anxiety as contributing to his pain as well for which she is on as needed Ativan.  No changes to his pain medications will be made today.  Opioid-induced constipation: Reinforced the need to take MiraLAX and senna on a daily basis     Visit Diagnosis 1. Encounter for antineoplastic chemotherapy   2. Neoplasm related pain   3. Malignant neoplasm of lower lobe of left lung ([place])      Dr. Randa Evens, MD, MPH [facility] [id number] 12/11/2020 3:40 PM

## 2020-12-11 NOTE — Telephone Encounter (Signed)
Lm for patient's sister, Elaine(EC)

## 2020-12-14 ENCOUNTER — Ambulatory Visit
Admission: RE | Admit: 2020-12-14 | Discharge: 2020-12-14 | Disposition: A | Discharge status: Home / Self Care | Payer: Medicare Other | Source: Ambulatory Visit | Attending: Radiation Oncology | Admitting: Radiation Oncology

## 2020-12-14 ENCOUNTER — Inpatient Hospital Stay: Payer: Medicare Other

## 2020-12-14 DIAGNOSIS — Z5111 Encounter for antineoplastic chemotherapy: Secondary | ICD-10-CM | POA: Diagnosis not present

## 2020-12-14 NOTE — Telephone Encounter (Signed)
Spoke to patient and scheduled OV for 12/17/2020 at 2:30. Lm x2 for patient's sister, Elaine(EC).  Nothing further needed at this time.

## 2020-12-15 ENCOUNTER — Other Ambulatory Visit: Payer: Self-pay

## 2020-12-15 ENCOUNTER — Ambulatory Visit
Admission: RE | Admit: 2020-12-15 | Discharge: 2020-12-15 | Disposition: A | Discharge status: Home / Self Care | Payer: Medicare Other | Source: Ambulatory Visit | Attending: Radiation Oncology | Admitting: Radiation Oncology

## 2020-12-15 ENCOUNTER — Inpatient Hospital Stay (HOSPITAL_BASED_OUTPATIENT_CLINIC_OR_DEPARTMENT_OTHER): Payer: Medicare Other | Admitting: Hospice and Palliative Medicine

## 2020-12-15 ENCOUNTER — Inpatient Hospital Stay: Payer: Medicare Other | Attending: Hospice and Palliative Medicine

## 2020-12-15 VITALS — BP 120/80 | HR 128 | Temp 98.1°F | Resp 22

## 2020-12-15 DIAGNOSIS — I700 Atherosclerosis of aorta: Secondary | ICD-10-CM | POA: Diagnosis not present

## 2020-12-15 DIAGNOSIS — F40240 Claustrophobia: Secondary | ICD-10-CM | POA: Insufficient documentation

## 2020-12-15 DIAGNOSIS — C3432 Malignant neoplasm of lower lobe, left bronchus or lung: Secondary | ICD-10-CM | POA: Insufficient documentation

## 2020-12-15 DIAGNOSIS — E871 Hypo-osmolality and hyponatremia: Secondary | ICD-10-CM | POA: Diagnosis not present

## 2020-12-15 DIAGNOSIS — F419 Anxiety disorder, unspecified: Secondary | ICD-10-CM | POA: Insufficient documentation

## 2020-12-15 DIAGNOSIS — E785 Hyperlipidemia, unspecified: Secondary | ICD-10-CM | POA: Insufficient documentation

## 2020-12-15 DIAGNOSIS — I10 Essential (primary) hypertension: Secondary | ICD-10-CM | POA: Insufficient documentation

## 2020-12-15 DIAGNOSIS — Z515 Encounter for palliative care: Secondary | ICD-10-CM

## 2020-12-15 DIAGNOSIS — Z79899 Other long term (current) drug therapy: Secondary | ICD-10-CM | POA: Insufficient documentation

## 2020-12-15 DIAGNOSIS — Z510 Encounter for antineoplastic radiation therapy: Secondary | ICD-10-CM | POA: Insufficient documentation

## 2020-12-15 DIAGNOSIS — Z87891 Personal history of nicotine dependence: Secondary | ICD-10-CM | POA: Diagnosis not present

## 2020-12-15 DIAGNOSIS — Z923 Personal history of irradiation: Secondary | ICD-10-CM | POA: Diagnosis not present

## 2020-12-15 DIAGNOSIS — Z5111 Encounter for antineoplastic chemotherapy: Secondary | ICD-10-CM | POA: Diagnosis present

## 2020-12-15 DIAGNOSIS — G893 Neoplasm related pain (acute) (chronic): Secondary | ICD-10-CM | POA: Insufficient documentation

## 2020-12-15 MED ORDER — NYSTATIN 100000 UNIT/ML MT SUSP: 5.0000 mL | Freq: Three times a day (TID) | OROMUCOSAL | 0 refills | Status: DC | PRN

## 2020-12-15 NOTE — Progress Notes (Signed)
Mitchell Frye  [phone] [phone]   Name: Amin Fornwalt Date: 12/15/2020 MRN: [id number]  DOB: [birth year]  Patient Care Team: Center, [facility] as PCP - Lillia Mountain, [place], RN as Oncology Nurse Navigator    REASON FOR CONSULTATION: Mitchell Frye is a 70 y.o. 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  Patient is status post chemotherapy now on maintenance durvalumab.  Patient was hospitalized 11/30/2020-12/06/2018 with intractable chest pain thought secondary to his lung cancer.  Patient received XRT and pain medication was liberalized. He was referred to palliative care to help address goals and manage ongoing symptoms.  SOCIAL HISTORY:     reports that he quit smoking about 28 years ago. His smoking use included cigarettes. He has a 20.00 pack-year smoking history. He has never used smokeless tobacco. He reports that he does not currently use alcohol. He reports that he does not use drugs.  Patient lives at home with his sister who is his primary caregiver.  He does not drive.  ADVANCE DIRECTIVES:  Does not have  CODE STATUS:   PAST MEDICAL HISTORY: 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 ([place])     PAST SURGICAL HISTORY:  Past Surgical History:  Procedure Laterality Date   ESOPHAGOGASTRODUODENOSCOPY (EGD) WITH PROPOFOL N/A 12/01/2020   Procedure: ESOPHAGOGASTRODUODENOSCOPY (EGD) WITH PROPOFOL;  Surgeon: Lin Landsman, MD;  Location: [facility] ENDOSCOPY;  Service: Gastroenterology;  Laterality: N/A;   PORTA CATH INSERTION N/A 10/22/2019   Procedure: PORTA CATH INSERTION;  Surgeon: Katha Cabal, MD;  Location: [place] [facility];  Service: Cardiovascular;  Laterality: N/A;    VIDEO BRONCHOSCOPY WITH ENDOBRONCHIAL ULTRASOUND N/A 09/25/2019   Procedure: VIDEO BRONCHOSCOPY WITH ENDOBRONCHIAL ULTRASOUND;  Surgeon: Tyler Pita, MD;  Location: [facility] ORS;  Service: Pulmonary;  Laterality: N/A;    HEMATOLOGY/ONCOLOGY HISTORY:  Oncology History  Malignant neoplasm of lower lobe of left lung ([place])  10/04/2019 Initial Diagnosis   Malignant neoplasm of lower lobe of left lung ([place])   10/04/2019 Cancer Staging   Staging form: Lung, AJCC 8th Edition - Clinical stage from 10/04/2019: Stage IIIB (cT2b, cN3, cM0) - Signed by Sindy Guadeloupe, MD on 10/04/2019   10/28/2019 - 11/18/2019 Chemotherapy   The patient had dexamethasone (DECADRON) 4 MG tablet, 8 mg, Oral, Daily, 1 of 1 cycle, Start date: 10/04/2019, End date: -- palonosetron (ALOXI) injection 0.25 mg, 0.25 mg, Intravenous,  Once, 4 of 4 cycles Administration: 0.25 mg (10/28/2019), 0.25 mg (11/04/2019), 0.25 mg (11/11/2019), 0.25 mg (11/18/2019) PACLitaxel-protein bound (ABRAXANE) chemo infusion 200 mg, 100 mg/m2 = 200 mg (100 % of original dose 100 mg/m2), Intravenous,  Once, 4 of 4 cycles Dose modification: 100 mg/m2 (original dose 100 mg/m2, Cycle 2), 80 mg/m2 (original dose 100 mg/m2, Cycle 3, Reason: Other (see comments), Comment: neutropenia), 100 mg/m2 (original dose 100 mg/m2, Cycle 1) Administration: 200 mg (11/04/2019), 175 mg (11/11/2019), 200 mg (10/29/2019), 175 mg (11/18/2019) CARBOplatin (PARAPLATIN) 210 mg in sodium chloride 0.9 % 250 mL chemo infusion, 210 mg (100 % of original dose 214.6 mg), Intravenous,  Once, 4 of 4 cycles Dose modification:   (original dose 214.6 mg, Cycle 1) Administration: 210 mg (10/28/2019), 210 mg (11/04/2019), 210 mg (11/11/2019), 210 mg (11/18/2019) PACLitaxel (TAXOL) 90  mg in sodium chloride 0.9 % 250 mL chemo infusion (</= 80mg /m2), 45 mg/m2 = 90 mg, Intravenous,  Once, 1 of 1 cycle Administration: 90 mg (10/28/2019)  for chemotherapy treatment.    11/25/2019 - 11/25/2019 Chemotherapy    The patient had palonosetron (ALOXI) injection 0.25 mg, 0.25 mg, Intravenous,  Once, 1 of 1 cycle Administration: 0.25 mg (11/25/2019) PEMEtrexed (ALIMTA) 1,000 mg in sodium chloride 0.9 % 100 mL chemo infusion, 500 mg/m2 = 1,000 mg, Intravenous,  Once, 1 of 1 cycle Administration: 1,000 mg (11/25/2019) CARBOplatin (PARAPLATIN) 550 mg in sodium chloride 0.9 % 250 mL chemo infusion, 550 mg (100 % of original dose 548 mg), Intravenous,  Once, 1 of 1 cycle Dose modification:   (original dose 548 mg, Cycle 1) Administration: 550 mg (11/25/2019) fosaprepitant (EMEND) 150 mg in sodium chloride 0.9 % 145 mL IVPB, 150 mg, Intravenous,  Once, 1 of 1 cycle Administration: 150 mg (11/25/2019)  for chemotherapy treatment.    01/14/2020 - 11/13/2020 Chemotherapy   Patient is on Treatment Plan : LUNG Durvalumab q14d     12/11/2020 -  Chemotherapy   Patient is on Treatment Plan : LUNG NSCLC Pemetrexed + Carboplatin q21d x 4 Cycles       ALLERGIES:  is allergic to taxol [paclitaxel].  MEDICATIONS:  Current Outpatient Medications  Medication Sig Dispense Refill   acetaminophen (TYLENOL) 500 MG tablet Take 1,000 mg by mouth every 6 (six) hours as needed for mild pain, moderate pain or headache. (Patient not taking: No sig reported)     albuterol (VENTOLIN HFA) 108 (90 Base) MCG/ACT inhaler Inhale 2 puffs into the lungs every 4 (four) hours as needed for wheezing or shortness of breath. 8 g 1   Budeson-Glycopyrrol-Formoterol (BREZTRI AEROSPHERE) 160-9-4.8 MCG/ACT AERO Inhale 2 puffs into the lungs in the morning and at bedtime. 10.7 g 5   clobetasol cream (TEMOVATE) 0.05 % Apply topically.     DULoxetine (CYMBALTA) 30 MG capsule Take 1 capsule (30 mg total) by mouth daily. 30 capsule 3   hydrOXYzine (ATARAX/VISTARIL) 25 MG tablet Take 25 mg by mouth daily.     lidocaine (LIDODERM) 5 % Place 1 patch onto the skin daily. Remove & Discard patch within 12 hours or as directed by MD 30 patch 0   lidocaine  (XYLOCAINE) 2 % solution Use as directed 15 mLs in the mouth or throat every 6 (six) hours as needed (abdominal pain). 100 mL 0   lidocaine-prilocaine (EMLA) cream Apply 1 application topically as needed. 30 g 0   LORazepam (ATIVAN) 0.5 MG tablet Take 1 tablet (0.5 mg total) by mouth every 8 (eight) hours as needed for anxiety. 30 tablet 0   morphine (MS CONTIN) 30 MG 12 hr tablet Take 1 tablet (30 mg total) by mouth every 8 (eight) hours. 60 tablet 0   naloxegol oxalate (MOVANTIK) 25 MG TABS tablet Take 1 tablet (25 mg total) by mouth daily. 30 tablet 0   naloxone (NARCAN) nasal spray 4 mg/0.1 mL SPRAY 1 SPRAY INTO ONE NOSTRIL AS DIRECTED FOR OPIOID OVERDOSE (TURN PERSON ON SIDE AFTER DOSE. IF NO RESPONSE IN 2-3 MINUTES OR PERSON RESPONDS BUT RELAPSES, REPEAT USING A NEW SPRAY DEVICE AND SPRAY INTO THE OTHER NOSTRIL. CALL 911 AFTER USE.) * EMERGENCY USE ONLY * 1 each 0   Oxycodone HCl 20 MG TABS Take 1 tablet (20 mg total) by mouth every 3 (three) hours as needed (for breakthrough pain). 60 tablet 0   pantoprazole (PROTONIX) 40 MG  tablet Take 1 tablet (40mg ) twice daily for two weeks and then take 1 tablet daily thereafter 60 tablet 3   potassium chloride SA (KLOR-CON) 20 MEQ tablet TAKE 1 TABLET BY MOUTH TWICE DAILY 30 tablet 1   sucralfate (CARAFATE) 1 g tablet Take 1 tablet (1 g total) by mouth 4 (four) times daily. 60 tablet 0   No current facility-administered medications for this visit.    VITAL SIGNS: There were no vitals taken for this visit. There were no vitals filed for this visit.  Estimated body mass index is 23.53 kg/m as calculated from the following:   Height as of 11/30/20: 5\' 10"  (1.778 m).   Weight as of 12/11/20: 164 lb (74.4 kg).  LABS: CBC:    Component Value Date/Time   WBC 7.8 12/11/2020 1041   HGB 9.6 (L) 12/11/2020 1041   HCT 31.1 (L) 12/11/2020 1041   PLT 574 (H) 12/11/2020 1041   MCV 78.3 (L) 12/11/2020 1041   NEUTROABS 6.3 12/11/2020 1041   LYMPHSABS 0.6  (L) 12/11/2020 1041   MONOABS 0.8 12/11/2020 1041   EOSABS 0.0 12/11/2020 1041   BASOSABS 0.0 12/11/2020 1041   Comprehensive Metabolic Panel:    Component Value Date/Time   NA 132 (L) 12/11/2020 1041   K 4.1 12/11/2020 1041   CL 101 12/11/2020 1041   CO2 24 12/11/2020 1041   BUN 16 12/11/2020 1041   CREATININE 0.62 12/11/2020 1041   GLUCOSE 109 (H) 12/11/2020 1041   CALCIUM 8.8 (L) 12/11/2020 1041   AST 15 12/11/2020 1041   ALT 22 12/11/2020 1041   ALKPHOS 56 12/11/2020 1041   BILITOT 0.6 12/11/2020 1041   PROT 6.7 12/11/2020 1041   ALBUMIN 2.7 (L) 12/11/2020 1041    RADIOGRAPHIC STUDIES: DG Chest 2 View  Result Date: 11/30/2020 CLINICAL DATA:  Chest pain, shortness of breath, chills EXAM: CHEST - 2 VIEW COMPARISON:  Chest radiograph and CTA chest 11/26/2010 FINDINGS: Chest wall port is stable in position. The cardiomediastinal silhouette is stable. Patchy opacity projecting over the left mid lung is unchanged. There is no new or progressive airspace disease. There is no pleural effusion or pneumothorax. The bones are stable. IMPRESSION: Unchanged masslike opacity projecting over the left midlung. No new or progressive airspace disease Electronically Signed   By: Valetta Mole M.D.   On: 11/30/2020 10:19   DG Chest 2 View  Result Date: 11/25/2020 CLINICAL DATA:  Chest pain EXAM: CHEST - 2 VIEW COMPARISON:  11/13/2020 FINDINGS: Masslike consolidation within the left lung base is again seen and appears stable since prior examination. Right lung is clear. No pneumothorax or pleural effusion. Right internal jugular chest port is seen with its tip at the superior right atrium. Cardiac size within normal limits. No acute bone abnormality. IMPRESSION: Stable masslike consolidation within the left lower lobe. Electronically Signed   By: Fidela Salisbury M.D.   On: 11/25/2020 01:29   CT Angio Chest PE W and/or Wo Contrast  Result Date: 11/30/2020 CLINICAL DATA:  Chest pain, shortness of  breath EXAM: CT ANGIOGRAPHY CHEST WITH CONTRAST TECHNIQUE: Multidetector CT imaging of the chest was performed using the standard protocol during bolus administration of intravenous contrast. Multiplanar CT image reconstructions and MIPs were obtained to evaluate the vascular anatomy. CONTRAST:  86mL OMNIPAQUE IOHEXOL 350 MG/ML SOLN COMPARISON:  11/25/2020 FINDINGS: Cardiovascular: Right IJ port catheter extends to right atrium. Heart size upper limits normal. Good contrast opacification of the pulmonary arterial tree. No definite acute PE, with  significant image degradation through the lung bases secondary to confirm continued patient breathing motion during the study. Good contrast opacification of the thoracic aorta without dissection, aneurysm, or stenosis. Bovine variant brachiocephalic arterial origin anatomy without proximal stenosis. No significant atheromatous change. Mediastinum/Nodes: 1 cm pretracheal lymph node. Subcarinal adenopathy stable. 6.6 cm left hilar mass involving and occluding branches of the left pulmonary veins, abutting upper lower lobe bronchi, with central air bronchograms. A Lungs/Pleura: Left hilar mass extending into the inferior lingula and left lower lobe. Right lung clear. Some image degradation secondary to continued breathing during the acquisition. Upper Abdomen: Renal cysts.  No acute findings. Musculoskeletal: No chest wall abnormality. No acute or significant osseous findings. Review of the MIP images confirms the above findings. IMPRESSION: 1. Negative for acute PE or thoracic aortic dissection. 2. Persistent left hilar masslike process involving branches of the left pulmonary veins and extending to involve the inferior lingula and left lower lobe, suspicious for neoplasm, as has been previously described 3. No evidence of distal metastatic disease. Electronically Signed   By: Lucrezia Europe M.D.   On: 11/30/2020 12:40   CT Angio Chest PE W and/or Wo Contrast  Result Date:  11/25/2020 CLINICAL DATA:  Three-month history of chest pain. History of lung cancer. EXAM: CT ANGIOGRAPHY CHEST WITH CONTRAST TECHNIQUE: Multidetector CT imaging of the chest was performed using the standard protocol during bolus administration of intravenous contrast. Multiplanar CT image reconstructions and MIPs were obtained to evaluate the vascular anatomy. CONTRAST:  40mL OMNIPAQUE IOHEXOL 350 MG/ML SOLN COMPARISON:  11/13/2020 FINDINGS: Cardiovascular: The heart is mildly enlarged but stable. No pericardial effusion. Mild tortuosity of the thoracic aorta but no aneurysm or dissection. Minimal atherosclerotic calcification at the aortic arch. The branch vessels are normal. No obvious coronary artery calcifications. The pulmonary arterial tree is fairly well opacified. No findings suspicious for pulmonary embolism. Extensive tumor surrounding the left pulmonary arteries. Mediastinum/Nodes: Persistent ill-defined soft tissue density involving the left hilar region and subcarinal space likely infiltrating tumor. Small stable right-sided hilar lymph nodes. Lungs/Pleura: Persistent and slightly progressive ill-defined soft tissue density involving the left hilum and left paramediastinal lung surrounding the left mainstem bronchus and left pulmonary artery consistent with recurrent tumor. There is also surrounding interstitial thickening and vague nodularity the left upper lobe and left lower lobe which could be obstructive pneumonitis or interstitial spread of tumor. The right lung remains relatively clear except for streaky right basilar atelectasis. No pulmonary lesions. No pleural effusions. Upper Abdomen: No significant upper abdominal findings. No hepatic or adrenal gland lesions. No upper abdominal adenopathy. Bilateral renal cysts are noted. Musculoskeletal: No significant bony findings. Review of the MIP images confirms the above findings. IMPRESSION: 1. No CT findings for pulmonary embolism. 2. Persistent  and slightly progressive ill-defined soft tissue density involving the left hilum and left paramediastinal lung most consistent with recurrent tumor. 3. Surrounding interstitial thickening and vague nodularity in the left upper lobe and left lower lobe could be obstructive pneumonitis or interstitial spread of tumor. 4. Left hilar and subcarinal adenopathy. 5. No findings for upper abdominal metastatic disease. Electronically Signed   By: Marijo Sanes M.D.   On: 11/25/2020 05:26   NM PET Image Restag (PS) Skull Base To Thigh  Result Date: 12/10/2020 CLINICAL DATA:  Follow-up treatment strategy for lung neoplasm. EXAM: NUCLEAR MEDICINE PET SKULL BASE TO THIGH TECHNIQUE: 9.39 mCi F-18 FDG was injected intravenously. Full-ring PET imaging was performed from the skull base to thigh after the  radiotracer. CT data was obtained and used for attenuation correction and anatomic localization. Fasting blood glucose: 112 mg/dl COMPARISON:  Chest CT dated December 01, 2019; PET-CT dated September 16, 2019 FINDINGS: Mediastinal blood pool activity: SUV max 2.5 Liver activity: SUV max NA NECK: No hypermetabolic lymph nodes in the neck. Incidental CT findings: none CHEST: Hypermetabolic lower paratracheal lymph node measuring 8 mm in short axis on series 3, image 74 with an SUV max of 3.7. Hypermetabolic subcarinal lymph node measuring 1.1 cm in short axis on image 94 with an SUV max of 8.5. Hypermetabolic lymph nodes which were previously seen on prior PET-CT are no longer apparent. Left hilar masslike opacity is unchanged in appearance when compared with most recent prior CT and demonstrates hypermetabolic activity, which is most pronounced in the left upper lobe, although the left hilar opacity is incompletely assessed with PET given early termination of exam, SUV max of 10.3. Mildly hypermetabolic irregular left upper lobe solid nodule measuring 1.0 cm on series 3, image 66 with SUV max of 2.7, finding is new compared to prior  PET-CT. Incidental CT findings: Right chest wall port with tip near the superior cavoatrial junction. Atherosclerotic disease of the thoracic aorta. ABDOMEN/PELVIS: NA Incidental CT findings: Evaluation the upper abdomen is somewhat limited due to motion artifact. Low-attenuation lesion of the right kidney which is likely a simple cyst. Trace free fluid in the pelvis. Atherosclerotic disease of the abdominal aorta. Cystic lesion of the tail of the pancreas measuring 1.0 cm on image 145, measured 8 mm on prior exam, likely slightly increased in size. Prostatomegaly. SKELETON: No focal hypermetabolic activity of the skull, cervical spine or upper chest. Incidental CT findings: none IMPRESSION: 1. Incomplete exam, terminated early due to patient claustrophobia. PET images only obtained for the neck and upper portion of the chest. 2. Left hilar masslike opacity demonstrates marked hypermetabolic activity which is concerning for recurrent disease. 3. Hypermetabolic paratracheal and subcarinal lymph nodes which are new compared to prior PET-CT and concerning for metastatic disease. 4. Hypermetabolic lymph nodes which were previously seen on prior PET-CT dated September 16, 2019 are no longer apparent. 5. Mildly hypermetabolic irregular solid left upper lobe nodule, concerning for additional site of metastatic disease. 6. Aortic Atherosclerosis (ICD10-I70.0). Electronically Signed   By: Yetta Glassman M.D.   On: 12/10/2020 16:19     PERFORMANCE STATUS (ECOG) : 1  Review of Systems Unless otherwise noted, a complete review of systems is negative.  Physical Exam General: NAD HEENT: white patches on tongue Pulmonary: Unlabored Extremities: no edema, no joint deformities Skin: no rashes Neurological: Weakness but otherwise nonfocal  IMPRESSION: Follow-up visit to reevaluate neoplasm related pain.  Patient reports that pain is significantly improved on current pain regimen.  He reports that it has "eased up a  lot".  He does endorse some mouth pain and a sore throat.  We will start patient on Magic mouthwash with nystatin.  Patient continues to endorse significant anxiety.  He is noted to rest comfortably when no staff are in the room and then almost hyperventilates at times when someone is with him or the door shut.  He was started on duloxetine which she just started taking.  Hopefully this will help with his anxiety.  He also has lorazepam to take as needed.   PLAN: -Continue current scope of treatment -Continue MS Contin 30 mg every 8 hours -Continue oxycodone 20 mg every 3-4 hours as needed for breakthrough pain -Continue Cymbalta 30 mg daily -  Continue lorazepam 0.5 mg every 8 hours as needed -Start Magic mouthwash -ACP and MOST form previously reviewed -Follow-up MyChart visit with me in 2 to 3 weeks  Case and plan discussed with Dr. Janese Banks  Patient expressed understanding and was in agreement with this plan. He also understands that He can call the clinic at any time with any questions, concerns, or complaints.     Time Total: 15 minutes  Visit consisted of counseling and education dealing with the complex and emotionally intense issues of symptom management and palliative care in the setting of serious and potentially life-threatening illness.Greater than 50%  of this time was spent counseling and coordinating care related to the above assessment and plan.  Signed by: Altha Harm, PhD, NP-C

## 2020-12-15 NOTE — Progress Notes (Signed)
Palliative care follow-up. Currently rates chest pain 9/10. Reports occasional nausea and vomiting, along with constipation.

## 2020-12-16 ENCOUNTER — Ambulatory Visit
Admission: RE | Admit: 2020-12-16 | Discharge: 2020-12-16 | Disposition: A | Discharge status: Home / Self Care | Payer: Medicare Other | Source: Ambulatory Visit | Attending: Radiation Oncology | Admitting: Radiation Oncology

## 2020-12-16 DIAGNOSIS — Z5111 Encounter for antineoplastic chemotherapy: Secondary | ICD-10-CM | POA: Diagnosis not present

## 2020-12-17 ENCOUNTER — Ambulatory Visit: Payer: Medicaid Other | Admitting: Pulmonary Disease

## 2020-12-21 ENCOUNTER — Inpatient Hospital Stay (HOSPITAL_BASED_OUTPATIENT_CLINIC_OR_DEPARTMENT_OTHER): Payer: Medicare Other | Admitting: Hospice and Palliative Medicine

## 2020-12-21 ENCOUNTER — Inpatient Hospital Stay: Payer: Medicare Other

## 2020-12-21 ENCOUNTER — Other Ambulatory Visit: Payer: Self-pay

## 2020-12-21 VITALS — BP 100/66 | HR 118 | Temp 99.9°F | Resp 22

## 2020-12-21 DIAGNOSIS — G893 Neoplasm related pain (acute) (chronic): Secondary | ICD-10-CM | POA: Diagnosis not present

## 2020-12-21 DIAGNOSIS — C3432 Malignant neoplasm of lower lobe, left bronchus or lung: Secondary | ICD-10-CM

## 2020-12-21 DIAGNOSIS — Z5111 Encounter for antineoplastic chemotherapy: Secondary | ICD-10-CM | POA: Diagnosis not present

## 2020-12-21 DIAGNOSIS — Z95828 Presence of other vascular implants and grafts: Secondary | ICD-10-CM

## 2020-12-21 LAB — CBC WITH DIFFERENTIAL/PLATELET
Abs Immature Granulocytes: 0.02 10*3/uL (ref 0.00–0.07)
Basophils Absolute: 0 10*3/uL (ref 0.0–0.1)
Basophils Relative: 0 %
Eosinophils Absolute: 0 10*3/uL (ref 0.0–0.5)
Eosinophils Relative: 1 %
HCT: 31.1 % — ABNORMAL LOW (ref 39.0–52.0)
Hemoglobin: 9.8 g/dL — ABNORMAL LOW (ref 13.0–17.0)
Immature Granulocytes: 1 %
Lymphocytes Relative: 33 %
Lymphs Abs: 0.7 10*3/uL (ref 0.7–4.0)
MCH: 24.1 pg — ABNORMAL LOW (ref 26.0–34.0)
MCHC: 31.5 g/dL (ref 30.0–36.0)
MCV: 76.6 fL — ABNORMAL LOW (ref 80.0–100.0)
Monocytes Absolute: 0.7 10*3/uL (ref 0.1–1.0)
Monocytes Relative: 31 %
Neutro Abs: 0.7 10*3/uL — ABNORMAL LOW (ref 1.7–7.7)
Neutrophils Relative %: 34 %
Platelets: 243 10*3/uL (ref 150–400)
RBC: 4.06 MIL/uL — ABNORMAL LOW (ref 4.22–5.81)
RDW: 18.7 % — ABNORMAL HIGH (ref 11.5–15.5)
WBC: 2.1 10*3/uL — ABNORMAL LOW (ref 4.0–10.5)
nRBC: 0 % (ref 0.0–0.2)

## 2020-12-21 LAB — COMPREHENSIVE METABOLIC PANEL
ALT: 15 U/L (ref 0–44)
AST: 20 U/L (ref 15–41)
Albumin: 2.7 g/dL — ABNORMAL LOW (ref 3.5–5.0)
Alkaline Phosphatase: 62 U/L (ref 38–126)
Anion gap: 9 (ref 5–15)
BUN: 14 mg/dL (ref 8–23)
CO2: 22 mmol/L (ref 22–32)
Calcium: 8.7 mg/dL — ABNORMAL LOW (ref 8.9–10.3)
Chloride: 101 mmol/L (ref 98–111)
Creatinine, Ser: 0.71 mg/dL (ref 0.61–1.24)
GFR, Estimated: >60 mL/min (ref >60)
Glucose, Bld: 131 mg/dL — ABNORMAL HIGH (ref 70–99)
Potassium: 3.6 mmol/L (ref 3.5–5.1)
Sodium: 132 mmol/L — ABNORMAL LOW (ref 135–145)
Total Bilirubin: 0.6 mg/dL (ref 0.3–1.2)
Total Protein: 6.7 g/dL (ref 6.5–8.1)

## 2020-12-21 MED ORDER — SODIUM CHLORIDE 0.9% FLUSH
10.0000 mL | Freq: Once | INTRAVENOUS | Status: DC
  Filled 2020-12-21: qty 10

## 2020-12-21 MED ORDER — SODIUM CHLORIDE 0.9 % IV SOLN
INTRAVENOUS | Status: DC
  Filled 2020-12-21: qty 250

## 2020-12-21 MED ORDER — HEPARIN SOD (PORK) LOCK FLUSH 100 UNIT/ML IV SOLN
500.0000 [IU] | Freq: Once | INTRAVENOUS | Status: AC
  Administered 2020-12-21: 500 [IU] via INTRAVENOUS
  Filled 2020-12-21: qty 5

## 2020-12-21 NOTE — Progress Notes (Signed)
Pt presents for follow-up and possible IV fluids. He states that his pain is doing better, although he rates 9/10.

## 2020-12-21 NOTE — Progress Notes (Signed)
Symptom Management [place]  [phone] [phone]  Patient Care Team: Center, [facility] as PCP - General Telford Nab, RN as Oncology Nurse Navigator   Name of the patient: Mitchell Frye  [id number]  July 25, 1950   Date of visit: 12/21/20  Reason for Consult:  Mitchell Frye is a 70 y.o. 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  Patient is status post chemotherapy now on maintenance durvalumab.  Patient was hospitalized 11/30/2020-12/06/2018 with intractable chest pain thought secondary to his lung cancer.  Patient received XRT and pain medication was liberalized.  Patient underwent PET scan on 12/08/2020, which unfortunately was not incomplete exam, terminated early due to claustrophobia.  There was note of left hilar mass concerning for recurrent disease and new hypermetabolic paratracheal and subcarinal lymph nodes.  Patient was seen in palliative care clinic on 12/15/2020 at which time he was symptomatically improved his pain was significantly improved on regimen of MS Contin and oxycodone.  He continues to endorse anxiety on Cymbalta and lorazepam.  Patient was started on Magic mouthwash due to mouth/throat pain.  Patient presents to [facility] today for follow-up and possible IV fluids.  Today, patient reports he is doing reasonably well.  He says that his pain is overall much improved.  He continues to have mildly worse pain at night.  He continues to endorse anxiety and sore throat.  He says he has not yet picked up his Magic mouthwash from the pharmacy.  Denies any neurologic complaints. Denies recent fevers or illnesses. Denies any easy bleeding or bruising. Reports good appetite and denies weight loss. Denies any nausea, vomiting, constipation, or diarrhea. Denies urinary complaints. Patient offers no  further specific complaints today.  PAST MEDICAL HISTORY: 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 ([place])     PAST SURGICAL HISTORY:  Past Surgical History:  Procedure Laterality Date   ESOPHAGOGASTRODUODENOSCOPY (EGD) WITH PROPOFOL N/A 12/01/2020   Procedure: ESOPHAGOGASTRODUODENOSCOPY (EGD) WITH PROPOFOL;  Surgeon: Lin Landsman, MD;  Location: [facility] ENDOSCOPY;  Service: Gastroenterology;  Laterality: N/A;   PORTA CATH INSERTION N/A 10/22/2019   Procedure: PORTA CATH INSERTION;  Surgeon: Katha Cabal, MD;  Location: [place] CV LAB;  Service: Cardiovascular;  Laterality: N/A;   VIDEO BRONCHOSCOPY WITH ENDOBRONCHIAL ULTRASOUND N/A 09/25/2019   Procedure: VIDEO BRONCHOSCOPY WITH ENDOBRONCHIAL ULTRASOUND;  Surgeon: Tyler Pita, MD;  Location: [facility] ORS;  Service: Pulmonary;  Laterality: N/A;    HEMATOLOGY/ONCOLOGY HISTORY:  Oncology History  Malignant neoplasm of lower lobe of left lung ([place])  10/04/2019 Initial Diagnosis   Malignant neoplasm of lower lobe of left lung ([place])   10/04/2019 Cancer Staging   Staging form: Lung, AJCC 8th Edition - Clinical stage from 10/04/2019: Stage IIIB (cT2b, cN3, cM0) - Signed by Sindy Guadeloupe, MD on 10/04/2019   10/28/2019 - 11/18/2019 Chemotherapy   The patient had dexamethasone (DECADRON) 4 MG tablet, 8 mg, Oral, Daily, 1 of 1 cycle, Start date: 10/04/2019, End date: -- palonosetron (ALOXI) injection 0.25 mg, 0.25 mg, Intravenous,  Once, 4 of 4 cycles Administration: 0.25 mg (10/28/2019), 0.25 mg (11/04/2019), 0.25 mg (11/11/2019), 0.25 mg (11/18/2019) PACLitaxel-protein bound (ABRAXANE) chemo infusion 200 mg, 100 mg/m2 = 200 mg (100 % of original dose 100 mg/m2), Intravenous,  Once, 4  of 4 cycles Dose modification: 100 mg/m2 (original dose 100 mg/m2, Cycle 2), 80 mg/m2 (original dose 100 mg/m2, Cycle 3, Reason: Other (see comments),  Comment: neutropenia), 100 mg/m2 (original dose 100 mg/m2, Cycle 1) Administration: 200 mg (11/04/2019), 175 mg (11/11/2019), 200 mg (10/29/2019), 175 mg (11/18/2019) CARBOplatin (PARAPLATIN) 210 mg in sodium chloride 0.9 % 250 mL chemo infusion, 210 mg (100 % of original dose 214.6 mg), Intravenous,  Once, 4 of 4 cycles Dose modification:   (original dose 214.6 mg, Cycle 1) Administration: 210 mg (10/28/2019), 210 mg (11/04/2019), 210 mg (11/11/2019), 210 mg (11/18/2019) PACLitaxel (TAXOL) 90 mg in sodium chloride 0.9 % 250 mL chemo infusion (</= 80mg /m2), 45 mg/m2 = 90 mg, Intravenous,  Once, 1 of 1 cycle Administration: 90 mg (10/28/2019)  for chemotherapy treatment.    11/25/2019 - 11/25/2019 Chemotherapy   The patient had palonosetron (ALOXI) injection 0.25 mg, 0.25 mg, Intravenous,  Once, 1 of 1 cycle Administration: 0.25 mg (11/25/2019) PEMEtrexed (ALIMTA) 1,000 mg in sodium chloride 0.9 % 100 mL chemo infusion, 500 mg/m2 = 1,000 mg, Intravenous,  Once, 1 of 1 cycle Administration: 1,000 mg (11/25/2019) CARBOplatin (PARAPLATIN) 550 mg in sodium chloride 0.9 % 250 mL chemo infusion, 550 mg (100 % of original dose 548 mg), Intravenous,  Once, 1 of 1 cycle Dose modification:   (original dose 548 mg, Cycle 1) Administration: 550 mg (11/25/2019) fosaprepitant (EMEND) 150 mg in sodium chloride 0.9 % 145 mL IVPB, 150 mg, Intravenous,  Once, 1 of 1 cycle Administration: 150 mg (11/25/2019)  for chemotherapy treatment.    01/14/2020 - 11/13/2020 Chemotherapy   Patient is on Treatment Plan : LUNG Durvalumab q14d     12/11/2020 -  Chemotherapy   Patient is on Treatment Plan : LUNG NSCLC Pemetrexed + Carboplatin q21d x 4 Cycles       ALLERGIES:  is allergic to taxol [paclitaxel].  MEDICATIONS:  Current Outpatient Medications  Medication Sig Dispense Refill   acetaminophen (TYLENOL) 500 MG tablet Take 1,000 mg by mouth every 6 (six) hours as needed for mild pain, moderate pain or headache. (Patient  not taking: No sig reported)     albuterol (VENTOLIN HFA) 108 (90 Base) MCG/ACT inhaler Inhale 2 puffs into the lungs every 4 (four) hours as needed for wheezing or shortness of breath. 8 g 1   Budeson-Glycopyrrol-Formoterol (BREZTRI AEROSPHERE) 160-9-4.8 MCG/ACT AERO Inhale 2 puffs into the lungs in the morning and at bedtime. 10.7 g 5   clobetasol cream (TEMOVATE) 0.05 % Apply topically.     DULoxetine (CYMBALTA) 30 MG capsule Take 1 capsule (30 mg total) by mouth daily. 30 capsule 3   hydrOXYzine (ATARAX/VISTARIL) 25 MG tablet Take 25 mg by mouth daily.     lidocaine (LIDODERM) 5 % Place 1 patch onto the skin daily. Remove & Discard patch within 12 hours or as directed by MD 30 patch 0   lidocaine-prilocaine (EMLA) cream Apply 1 application topically as needed. 30 g 0   LORazepam (ATIVAN) 0.5 MG tablet Take 1 tablet (0.5 mg total) by mouth every 8 (eight) hours as needed for anxiety. 30 tablet 0   magic mouthwash (nystatin, lidocaine, diphenhydrAMINE, alum & mag hydroxide) suspension Swish and spit 5 mLs 3 (three) times daily as needed for mouth pain. 180 mL 0   morphine (MS CONTIN) 30 MG 12 hr tablet Take 1 tablet (30 mg total) by mouth every 8 (eight) hours. 60 tablet 0   naloxegol oxalate (MOVANTIK) 25 MG TABS tablet Take 1  tablet (25 mg total) by mouth daily. 30 tablet 0   naloxone (NARCAN) nasal spray 4 mg/0.1 mL SPRAY 1 SPRAY INTO ONE NOSTRIL AS DIRECTED FOR OPIOID OVERDOSE (TURN PERSON ON SIDE AFTER DOSE. IF NO RESPONSE IN 2-3 MINUTES OR PERSON RESPONDS BUT RELAPSES, REPEAT USING A NEW SPRAY DEVICE AND SPRAY INTO THE OTHER NOSTRIL. CALL 911 AFTER USE.) * EMERGENCY USE ONLY * 1 each 0   Oxycodone HCl 20 MG TABS Take 1 tablet (20 mg total) by mouth every 3 (three) hours as needed (for breakthrough pain). 60 tablet 0   pantoprazole (PROTONIX) 40 MG tablet Take 1 tablet (40mg ) twice daily for two weeks and then take 1 tablet daily thereafter 60 tablet 3   potassium chloride SA (KLOR-CON) 20 MEQ  tablet TAKE 1 TABLET BY MOUTH TWICE DAILY 30 tablet 1   sucralfate (CARAFATE) 1 g tablet Take 1 tablet (1 g total) by mouth 4 (four) times daily. 60 tablet 0   No current facility-administered medications for this visit.    VITAL SIGNS: There were no vitals taken for this visit. There were no vitals filed for this visit.  Estimated body mass index is 23.53 kg/m as calculated from the following:   Height as of 11/30/20: 5\' 10"  (1.778 m).   Weight as of 12/11/20: 164 lb (74.4 kg).  LABS: CBC:    Component Value Date/Time   WBC 7.8 12/11/2020 1041   HGB 9.6 (L) 12/11/2020 1041   HCT 31.1 (L) 12/11/2020 1041   PLT 574 (H) 12/11/2020 1041   MCV 78.3 (L) 12/11/2020 1041   NEUTROABS 6.3 12/11/2020 1041   LYMPHSABS 0.6 (L) 12/11/2020 1041   MONOABS 0.8 12/11/2020 1041   EOSABS 0.0 12/11/2020 1041   BASOSABS 0.0 12/11/2020 1041   Comprehensive Metabolic Panel:    Component Value Date/Time   NA 132 (L) 12/11/2020 1041   K 4.1 12/11/2020 1041   CL 101 12/11/2020 1041   CO2 24 12/11/2020 1041   BUN 16 12/11/2020 1041   CREATININE 0.62 12/11/2020 1041   GLUCOSE 109 (H) 12/11/2020 1041   CALCIUM 8.8 (L) 12/11/2020 1041   AST 15 12/11/2020 1041   ALT 22 12/11/2020 1041   ALKPHOS 56 12/11/2020 1041   BILITOT 0.6 12/11/2020 1041   PROT 6.7 12/11/2020 1041   ALBUMIN 2.7 (L) 12/11/2020 1041    RADIOGRAPHIC STUDIES: DG Chest 2 View  Result Date: 11/30/2020 CLINICAL DATA:  Chest pain, shortness of breath, chills EXAM: CHEST - 2 VIEW COMPARISON:  Chest radiograph and CTA chest 11/26/2010 FINDINGS: Chest wall port is stable in position. The cardiomediastinal silhouette is stable. Patchy opacity projecting over the left mid lung is unchanged. There is no new or progressive airspace disease. There is no pleural effusion or pneumothorax. The bones are stable. IMPRESSION: Unchanged masslike opacity projecting over the left midlung. No new or progressive airspace disease Electronically Signed    By: Valetta Mole M.D.   On: 11/30/2020 10:19   DG Chest 2 View  Result Date: 11/25/2020 CLINICAL DATA:  Chest pain EXAM: CHEST - 2 VIEW COMPARISON:  11/13/2020 FINDINGS: Masslike consolidation within the left lung base is again seen and appears stable since prior examination. Right lung is clear. No pneumothorax or pleural effusion. Right internal jugular chest port is seen with its tip at the superior right atrium. Cardiac size within normal limits. No acute bone abnormality. IMPRESSION: Stable masslike consolidation within the left lower lobe. Electronically Signed   By: Linwood Dibbles.D.  On: 11/25/2020 01:29   CT Angio Chest PE W and/or Wo Contrast  Result Date: 11/30/2020 CLINICAL DATA:  Chest pain, shortness of breath EXAM: CT ANGIOGRAPHY CHEST WITH CONTRAST TECHNIQUE: Multidetector CT imaging of the chest was performed using the standard protocol during bolus administration of intravenous contrast. Multiplanar CT image reconstructions and MIPs were obtained to evaluate the vascular anatomy. CONTRAST:  89mL OMNIPAQUE IOHEXOL 350 MG/ML SOLN COMPARISON:  11/25/2020 FINDINGS: Cardiovascular: Right IJ port catheter extends to right atrium. Heart size upper limits normal. Good contrast opacification of the pulmonary arterial tree. No definite acute PE, with significant image degradation through the lung bases secondary to confirm continued patient breathing motion during the study. Good contrast opacification of the thoracic aorta without dissection, aneurysm, or stenosis. Bovine variant brachiocephalic arterial origin anatomy without proximal stenosis. No significant atheromatous change. Mediastinum/Nodes: 1 cm pretracheal lymph node. Subcarinal adenopathy stable. 6.6 cm left hilar mass involving and occluding branches of the left pulmonary veins, abutting upper lower lobe bronchi, with central air bronchograms. A Lungs/Pleura: Left hilar mass extending into the inferior lingula and left lower lobe.  Right lung clear. Some image degradation secondary to continued breathing during the acquisition. Upper Abdomen: Renal cysts.  No acute findings. Musculoskeletal: No chest wall abnormality. No acute or significant osseous findings. Review of the MIP images confirms the above findings. IMPRESSION: 1. Negative for acute PE or thoracic aortic dissection. 2. Persistent left hilar masslike process involving branches of the left pulmonary veins and extending to involve the inferior lingula and left lower lobe, suspicious for neoplasm, as has been previously described 3. No evidence of distal metastatic disease. Electronically Signed   By: Lucrezia Europe M.D.   On: 11/30/2020 12:40   CT Angio Chest PE W and/or Wo Contrast  Result Date: 11/25/2020 CLINICAL DATA:  Three-month history of chest pain. History of lung cancer. EXAM: CT ANGIOGRAPHY CHEST WITH CONTRAST TECHNIQUE: Multidetector CT imaging of the chest was performed using the standard protocol during bolus administration of intravenous contrast. Multiplanar CT image reconstructions and MIPs were obtained to evaluate the vascular anatomy. CONTRAST:  72mL OMNIPAQUE IOHEXOL 350 MG/ML SOLN COMPARISON:  11/13/2020 FINDINGS: Cardiovascular: The heart is mildly enlarged but stable. No pericardial effusion. Mild tortuosity of the thoracic aorta but no aneurysm or dissection. Minimal atherosclerotic calcification at the aortic arch. The branch vessels are normal. No obvious coronary artery calcifications. The pulmonary arterial tree is fairly well opacified. No findings suspicious for pulmonary embolism. Extensive tumor surrounding the left pulmonary arteries. Mediastinum/Nodes: Persistent ill-defined soft tissue density involving the left hilar region and subcarinal space likely infiltrating tumor. Small stable right-sided hilar lymph nodes. Lungs/Pleura: Persistent and slightly progressive ill-defined soft tissue density involving the left hilum and left paramediastinal  lung surrounding the left mainstem bronchus and left pulmonary artery consistent with recurrent tumor. There is also surrounding interstitial thickening and vague nodularity the left upper lobe and left lower lobe which could be obstructive pneumonitis or interstitial spread of tumor. The right lung remains relatively clear except for streaky right basilar atelectasis. No pulmonary lesions. No pleural effusions. Upper Abdomen: No significant upper abdominal findings. No hepatic or adrenal gland lesions. No upper abdominal adenopathy. Bilateral renal cysts are noted. Musculoskeletal: No significant bony findings. Review of the MIP images confirms the above findings. IMPRESSION: 1. No CT findings for pulmonary embolism. 2. Persistent and slightly progressive ill-defined soft tissue density involving the left hilum and left paramediastinal lung most consistent with recurrent tumor. 3. Surrounding interstitial thickening  and vague nodularity in the left upper lobe and left lower lobe could be obstructive pneumonitis or interstitial spread of tumor. 4. Left hilar and subcarinal adenopathy. 5. No findings for upper abdominal metastatic disease. Electronically Signed   By: Marijo Sanes M.D.   On: 11/25/2020 05:26   NM PET Image Restag (PS) Skull Base To Thigh  Result Date: 12/10/2020 CLINICAL DATA:  Follow-up treatment strategy for lung neoplasm. EXAM: NUCLEAR MEDICINE PET SKULL BASE TO THIGH TECHNIQUE: 9.39 mCi F-18 FDG was injected intravenously. Full-ring PET imaging was performed from the skull base to thigh after the radiotracer. CT data was obtained and used for attenuation correction and anatomic localization. Fasting blood glucose: 112 mg/dl COMPARISON:  Chest CT dated December 01, 2019; PET-CT dated September 16, 2019 FINDINGS: Mediastinal blood pool activity: SUV max 2.5 Liver activity: SUV max NA NECK: No hypermetabolic lymph nodes in the neck. Incidental CT findings: none CHEST: Hypermetabolic lower  paratracheal lymph node measuring 8 mm in short axis on series 3, image 74 with an SUV max of 3.7. Hypermetabolic subcarinal lymph node measuring 1.1 cm in short axis on image 94 with an SUV max of 8.5. Hypermetabolic lymph nodes which were previously seen on prior PET-CT are no longer apparent. Left hilar masslike opacity is unchanged in appearance when compared with most recent prior CT and demonstrates hypermetabolic activity, which is most pronounced in the left upper lobe, although the left hilar opacity is incompletely assessed with PET given early termination of exam, SUV max of 10.3. Mildly hypermetabolic irregular left upper lobe solid nodule measuring 1.0 cm on series 3, image 66 with SUV max of 2.7, finding is new compared to prior PET-CT. Incidental CT findings: Right chest wall port with tip near the superior cavoatrial junction. Atherosclerotic disease of the thoracic aorta. ABDOMEN/PELVIS: NA Incidental CT findings: Evaluation the upper abdomen is somewhat limited due to motion artifact. Low-attenuation lesion of the right kidney which is likely a simple cyst. Trace free fluid in the pelvis. Atherosclerotic disease of the abdominal aorta. Cystic lesion of the tail of the pancreas measuring 1.0 cm on image 145, measured 8 mm on prior exam, likely slightly increased in size. Prostatomegaly. SKELETON: No focal hypermetabolic activity of the skull, cervical spine or upper chest. Incidental CT findings: none IMPRESSION: 1. Incomplete exam, terminated early due to patient claustrophobia. PET images only obtained for the neck and upper portion of the chest. 2. Left hilar masslike opacity demonstrates marked hypermetabolic activity which is concerning for recurrent disease. 3. Hypermetabolic paratracheal and subcarinal lymph nodes which are new compared to prior PET-CT and concerning for metastatic disease. 4. Hypermetabolic lymph nodes which were previously seen on prior PET-CT dated September 16, 2019 are no  longer apparent. 5. Mildly hypermetabolic irregular solid left upper lobe nodule, concerning for additional site of metastatic disease. 6. Aortic Atherosclerosis (ICD10-I70.0). Electronically Signed   By: Yetta Glassman M.D.   On: 12/10/2020 16:19    PERFORMANCE STATUS (ECOG) : 1 - Symptomatic but completely ambulatory  Review of Systems Unless otherwise noted, a complete review of systems is negative.  Physical Exam General: NAD Cardiovascular: regular rate and rhythm Pulmonary: clear ant fields Abdomen: soft, nontender, + bowel sounds GU: no suprapubic tenderness Extremities: no edema, no joint deformities Skin: no rashes Neurological: Weakness but otherwise nonfocal  Assessment and Plan- Patient is a 70 y.o. male stage IIIb adenocarcinoma of the lung status post concurrent chemo/radiation now on maintenance immunotherapy who presents to [facility] for evaluation of  pain   Neoplasm related pain-overall improved.  Continue MS Contin/oxycodone.   Anxiety  -continue Cymbalta and as needed lorazepam.  We will plan to dose increase Cymbalta in the next couple of weeks if anxiety remains poorly controlled.  Could also consider rotating to clonazepam for longer acting anxiolytic.  However, patient is a poor historian and unclear how often he is actually taking the lorazepam.  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  Patient to pick up Magic mouthwash prescription from the pharmacy.  Patient to RTC next week to see Dr. Janese Banks or sooner in [facility] if needed  Patient expressed understanding and was in agreement with this plan. He also understands that He can call clinic at any time with any questions, concerns, or complaints.   Thank you for allowing me to participate in the care of this very pleasant patient.   Time Total: 20 minutes  Visit consisted of counseling and education dealing with the complex and  emotionally intense issues of symptom management and palliative care in the setting of serious and potentially life-threatening illness.Greater than 50%  of this time was spent counseling and coordinating care related to the above assessment and plan.  Signed by: Altha Harm, PhD, NP-C

## 2021-01-01 ENCOUNTER — Inpatient Hospital Stay (HOSPITAL_BASED_OUTPATIENT_CLINIC_OR_DEPARTMENT_OTHER): Payer: Medicare Other | Admitting: Oncology

## 2021-01-01 ENCOUNTER — Inpatient Hospital Stay: Payer: Medicare Other

## 2021-01-01 ENCOUNTER — Other Ambulatory Visit: Payer: Self-pay

## 2021-01-01 ENCOUNTER — Encounter: Payer: Self-pay | Admitting: Oncology

## 2021-01-01 ENCOUNTER — Other Ambulatory Visit: Payer: Self-pay | Admitting: *Deleted

## 2021-01-01 VITALS — HR 81

## 2021-01-01 VITALS — BP 123/78 | HR 103 | Temp 97.7°F | Resp 18 | Ht 70.0 in | Wt 162.5 lb

## 2021-01-01 DIAGNOSIS — C3432 Malignant neoplasm of lower lobe, left bronchus or lung: Secondary | ICD-10-CM

## 2021-01-01 DIAGNOSIS — F419 Anxiety disorder, unspecified: Secondary | ICD-10-CM

## 2021-01-01 DIAGNOSIS — G893 Neoplasm related pain (acute) (chronic): Secondary | ICD-10-CM

## 2021-01-01 DIAGNOSIS — Z5111 Encounter for antineoplastic chemotherapy: Secondary | ICD-10-CM

## 2021-01-01 LAB — CBC WITH DIFFERENTIAL/PLATELET
Abs Immature Granulocytes: 0.02 10*3/uL (ref 0.00–0.07)
Basophils Absolute: 0 10*3/uL (ref 0.0–0.1)
Basophils Relative: 0 %
Eosinophils Absolute: 0 10*3/uL (ref 0.0–0.5)
Eosinophils Relative: 0 %
HCT: 30.1 % — ABNORMAL LOW (ref 39.0–52.0)
Hemoglobin: 9.2 g/dL — ABNORMAL LOW (ref 13.0–17.0)
Immature Granulocytes: 1 %
Lymphocytes Relative: 18 %
Lymphs Abs: 0.8 10*3/uL (ref 0.7–4.0)
MCH: 24 pg — ABNORMAL LOW (ref 26.0–34.0)
MCHC: 30.6 g/dL (ref 30.0–36.0)
MCV: 78.4 fL — ABNORMAL LOW (ref 80.0–100.0)
Monocytes Absolute: 0.6 10*3/uL (ref 0.1–1.0)
Monocytes Relative: 13 %
Neutro Abs: 3 10*3/uL (ref 1.7–7.7)
Neutrophils Relative %: 68 %
Platelets: 549 10*3/uL — ABNORMAL HIGH (ref 150–400)
RBC: 3.84 MIL/uL — ABNORMAL LOW (ref 4.22–5.81)
RDW: 19.2 % — ABNORMAL HIGH (ref 11.5–15.5)
WBC: 4.3 10*3/uL (ref 4.0–10.5)
nRBC: 0 % (ref 0.0–0.2)

## 2021-01-01 LAB — COMPREHENSIVE METABOLIC PANEL
ALT: 13 U/L (ref 0–44)
AST: 21 U/L (ref 15–41)
Albumin: 2.6 g/dL — ABNORMAL LOW (ref 3.5–5.0)
Alkaline Phosphatase: 64 U/L (ref 38–126)
Anion gap: 9 (ref 5–15)
BUN: 11 mg/dL (ref 8–23)
CO2: 25 mmol/L (ref 22–32)
Calcium: 8.6 mg/dL — ABNORMAL LOW (ref 8.9–10.3)
Chloride: 102 mmol/L (ref 98–111)
Creatinine, Ser: 0.7 mg/dL (ref 0.61–1.24)
GFR, Estimated: >60 mL/min (ref >60)
Glucose, Bld: 122 mg/dL — ABNORMAL HIGH (ref 70–99)
Potassium: 3.4 mmol/L — ABNORMAL LOW (ref 3.5–5.1)
Sodium: 136 mmol/L (ref 135–145)
Total Bilirubin: 0.1 mg/dL — ABNORMAL LOW (ref 0.3–1.2)
Total Protein: 6.5 g/dL (ref 6.5–8.1)

## 2021-01-01 MED ORDER — SODIUM CHLORIDE 0.9 % IV SOLN
Freq: Once | INTRAVENOUS | Status: AC
  Filled 2021-01-01: qty 250

## 2021-01-01 MED ORDER — PALONOSETRON HCL INJECTION 0.25 MG/5ML
0.2500 mg | Freq: Once | INTRAVENOUS | Status: AC
  Administered 2021-01-01: 0.25 mg via INTRAVENOUS
  Filled 2021-01-01: qty 5

## 2021-01-01 MED ORDER — LORAZEPAM 0.5 MG PO TABS: 0.5000 mg | ORAL_TABLET | Freq: Three times a day (TID) | ORAL | 0 refills | Status: DC | PRN

## 2021-01-01 MED ORDER — SODIUM CHLORIDE 0.9 % IV SOLN
150.0000 mg | Freq: Once | INTRAVENOUS | Status: AC
  Administered 2021-01-01: 150 mg via INTRAVENOUS
  Filled 2021-01-01: qty 150

## 2021-01-01 MED ORDER — OXYCODONE HCL 20 MG PO TABS: 20.0000 mg | ORAL_TABLET | ORAL | 0 refills | Status: DC | PRN

## 2021-01-01 MED ORDER — NALOXEGOL OXALATE 25 MG PO TABS
25.0000 mg | ORAL_TABLET | Freq: Every day | ORAL | 2 refills | Status: DC
Start: 2021-01-01 — End: 2023-04-24

## 2021-01-01 MED ORDER — SODIUM CHLORIDE 0.9 % IV SOLN
494.5000 mg | Freq: Once | INTRAVENOUS | Status: AC
  Administered 2021-01-01: 490 mg via INTRAVENOUS
  Filled 2021-01-01: qty 49

## 2021-01-01 MED ORDER — SODIUM CHLORIDE 0.9 % IV SOLN
500.0000 mg/m2 | Freq: Once | INTRAVENOUS | Status: AC
  Administered 2021-01-01: 1000 mg via INTRAVENOUS
  Filled 2021-01-01: qty 40

## 2021-01-01 MED ORDER — SODIUM CHLORIDE 0.9 % IV SOLN
10.0000 mg | Freq: Once | INTRAVENOUS | Status: AC
  Administered 2021-01-01: 10 mg via INTRAVENOUS
  Filled 2021-01-01: qty 10

## 2021-01-01 MED ORDER — MORPHINE SULFATE ER 30 MG PO TBCR: 30.0000 mg | EXTENDED_RELEASE_TABLET | Freq: Three times a day (TID) | ORAL | 0 refills | Status: DC

## 2021-01-01 MED ORDER — HEPARIN SOD (PORK) LOCK FLUSH 100 UNIT/ML IV SOLN
500.0000 [IU] | Freq: Once | INTRAVENOUS | Status: AC | PRN
  Administered 2021-01-01: 500 [IU]
  Filled 2021-01-01: qty 5

## 2021-01-01 NOTE — Patient Instructions (Signed)
C[facility]ER CENTER [place] [facility]  Discharge Instructions: Thank you for choosing [facility] to provide your oncology and hematology care.  If you have a lab appointment with the Cancer Center, please go directly to the Cancer Center and check in at the registration area.  Wear comfortable clothing and clothing appropriate for easy access to any Portacath or PICC line.   We strive to give you quality time with your provider. You may need to reschedule your appointment if you arrive late (15 or more minutes).  Arriving late affects you and other patients whose appointments are after yours.  Also, if you miss three or more appointments without notifying the office, you may be dismissed from the clinic at the provider's discretion.      For prescription refill requests, have your pharmacy contact our office and allow 72 hours for refills to be completed.      To help prevent nausea and vomiting after your treatment, we encourage you to take your nausea medication as directed.  BELOW ARE SYMPTOMS THAT SHOULD BE REPORTED IMMEDIATELY: *FEVER GREATER THAN 100.4 F (38 C) OR HIGHER *CHILLS OR SWEATING *NAUSEA AND VOMITING THAT IS NOT CONTROLLED WITH YOUR NAUSEA MEDICATION *UNUSUAL SHORTNESS OF BREATH *UNUSUAL BRUISING OR BLEEDING *URINARY PROBLEMS (pain or burning when urinating, or frequent urination) *BOWEL PROBLEMS (unusual diarrhea, constipation, pain near the anus) TENDERNESS IN MOUTH AND THROAT WITH OR WITHOUT PRESENCE OF ULCERS (sore throat, sores in mouth, or a toothache) UNUSUAL RASH, SWELLING OR PAIN  UNUSUAL VAGINAL DISCHARGE OR ITCHING   Items with * indicate a potential emergency and should be followed up as soon as possible or go to the Emergency Department if any problems should occur.  Please show the CHEMOTHERAPY ALERT CARD or IMMUNOTHERAPY ALERT CARD at check-in to the Emergency Department and triage nurse.  Should you have questions after your  visit or need to cancel or reschedule your appointment, please contact CANCER CENTER [place] [facility]  [phone] and follow the prompts.  Office hours are 8:00 a.m. to 4:30 p.m. Monday - Friday. Please note that voicemails left after 4:00 p.m. may not be returned until the following business day.  We are closed weekends and major holidays. You have access to a nurse at all times for urgent questions. Please call the main number to the clinic [phone] and follow the prompts.  For any non-urgent questions, you may also contact your provider using MyChart. We now offer e-Visits for anyone 18 and older to request care online for non-urgent symptoms. For details visit [email or web address].   Also download the MyChart app! Go to the app store, search "MyChart", open the app, select [place], and log in with your MyChart username and password.  Due to Covid, a mask is required upon entering the hospital/clinic. If you do not have a mask, one will be given to you upon arrival. For doctor visits, patients may have 1 support person aged 18 or older with them. For treatment visits, patients cannot have anyone with them due to current Covid guidelines and our immunocompromised population.  

## 2021-01-01 NOTE — Progress Notes (Signed)
Pt

## 2021-01-01 NOTE — Progress Notes (Signed)
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  He underwent CT angios chest which did not show any PE.  Soft tissue attenuation measuring 2.9 x 4 cm centered in the left hilum resulting in abrupt angulation and narrowing of the pulmonary arteries and the central left lower lobe airways.  Additional ipsilateral hilar and subcarinal adenopathy.  No contralateral adenopathy.  Consolidative masslike opacity 3.6 x 3.1 cm in size and contiguous with more central perihilar soft tissue attenuation.  Overall findings concerning for primary bronchogenic carcinoma.    Patient lives with his sister who is his main caregiver.  He does not drive but is independent of his ADLs.  Reports ongoing fatigue and occasional retrosternal chest pain.  He has been evaluated by GI in the past for reflux as well.  Appetie is fair and weight is stable.  Denies any significant shortness of breath at this time.  Patient is an ex-smoker and smoked for about 20 years but quit smoking back in 1994   PET CT scan showed hypermetabolic mass in the left lower lobe with mediastinal adenopathy and right paratracheal adenopathy.  No evidence of distant metastatic disease.  Pathology was consistent with non-small cell lung cancer favor adenocarcinoma.  Cells were positive for TTF-1 and  negative for p40.   Patient started concurrent carbotaxol radiation and then had allergic reaction to Taxol for which she was switched to Abraxane.  Given the short supply of Abraxane patient received carbo Alimta midway through his concurrent chemoradiation.  Scans post chemoradiation showed partial response and patient will be started on maintenance durvalumab on 01/14/2020.  Patient was on maintenance durvalumab and received 15 cycles up until August 2022.  He was noted to have worsening chest pain and was in the ER multiple times in September 2022.  Repeat CT scan showed concern for hilar recurrence.    Interval history-patient reports that his midsternal chest pain has somewhat eased off.  He is still using his long-acting morphine 3 times a day and as needed oxycodone.  He uses as needed Ativan for his anxiety.  Reports exertional shortness of breath  ECOG PS- 1 Pain scale- 3 Opioid associated constipation- no  Review of systems- Review of Systems  Constitutional:  Positive for malaise/fatigue. Negative for chills, fever and weight loss.  HENT:  Negative for congestion, ear discharge and nosebleeds.   Eyes:  Negative for blurred vision.  Respiratory:  Positive for shortness of breath. Negative for cough, hemoptysis, sputum production and wheezing.   Cardiovascular:  Positive for chest pain. Negative for palpitations, orthopnea and claudication.  Gastrointestinal:  Negative for abdominal pain, blood in stool, constipation, diarrhea, heartburn, melena, nausea and vomiting.  Genitourinary:  Negative for dysuria, flank pain, frequency, hematuria and urgency.  Musculoskeletal:  Negative for back pain, joint pain and myalgias.  Skin:  Negative for rash.  Neurological:  Negative for dizziness, tingling, focal  weakness, seizures, weakness and headaches.  Endo/Heme/Allergies:  Does not bruise/bleed easily.  Psychiatric/Behavioral:  Negative for depression and suicidal ideas. The patient does not  have insomnia.      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 ([place])      Past Surgical History:  Procedure Laterality Date   ESOPHAGOGASTRODUODENOSCOPY (EGD) WITH PROPOFOL N/A 12/01/2020   Procedure: ESOPHAGOGASTRODUODENOSCOPY (EGD) WITH PROPOFOL;  Surgeon: Lin Landsman, MD;  Location: [facility] ENDOSCOPY;  Service: Gastroenterology;  Laterality: N/A;   PORTA CATH INSERTION N/A 10/22/2019   Procedure: PORTA CATH INSERTION;  Surgeon: Katha Cabal, MD;  Location: [place] CV LAB;  Service: Cardiovascular;  Laterality: N/A;   VIDEO BRONCHOSCOPY WITH ENDOBRONCHIAL ULTRASOUND N/A 09/25/2019   Procedure: VIDEO BRONCHOSCOPY WITH ENDOBRONCHIAL ULTRASOUND;  Surgeon: Tyler Pita, MD;  Location: [facility] ORS;  Service: Pulmonary;  Laterality: N/A;    Social History   Socioeconomic History   Marital status: Single    Spouse name: Not on file   Number of children: Not on file   Years of education: Not on file   Highest education level: Not on file  Occupational History   Not on file  Tobacco Use   Smoking status: Former    Packs/day: 1.00    Years: 20.00    Pack years: 20.00    Types: Cigarettes    Quit date: 50    Years since quitting: 28.8   Smokeless tobacco: Never  Vaping Use   Vaping Use: Never used  Substance and Sexual Activity   Alcohol use: Not Currently    Comment: not drank any beer in 2 onths   Drug use: Never   Sexual activity: Not on file  Other Topics Concern   Not on file  Social History Narrative   Not on file   Social Determinants of Health   Financial Resource Strain: Not on file  Food Insecurity: Not on file  Transportation Needs: Not on file  Physical Activity: Not on file  Stress: Not on file  Social Connections:  Not on file  Intimate Partner Violence: Not on file    Family History  Problem Relation Age of Onset   Cancer Brother      Current Outpatient Medications:    acetaminophen (TYLENOL) 500 MG tablet, Take 1,000 mg by mouth every 6 (six) hours as needed for mild pain, moderate pain or headache., Disp: , Rfl:    albuterol (VENTOLIN HFA) 108 (90 Base) MCG/ACT inhaler, Inhale 2 puffs into the lungs every 4 (four) hours as needed for wheezing or shortness of breath., Disp: 8 g, Rfl: 1   Budeson-Glycopyrrol-Formoterol (BREZTRI AEROSPHERE) 160-9-4.8 MCG/ACT AERO, Inhale 2 puffs into the lungs in the morning and at bedtime., Disp: 10.7 g, Rfl: 5   clobetasol cream (TEMOVATE) 0.05 %, Apply topically., Disp: , Rfl:    DULoxetine (CYMBALTA) 30 MG capsule, Take 1 capsule (30 mg total) by mouth daily., Disp: 30 capsule, Rfl: 3   hydrOXYzine (ATARAX/VISTARIL) 25 MG tablet, Take 25 mg by mouth daily., Disp: , Rfl:    lidocaine (LIDODERM) 5 %, Place 1 patch onto the skin daily. Remove & Discard patch within 12 hours or as directed by MD, Disp: 30 patch, Rfl: 0  lidocaine-prilocaine (EMLA) cream, Apply 1 application topically as needed., Disp: 30 g, Rfl: 0   naloxone (NARCAN) nasal spray 4 mg/0.1 mL, SPRAY 1 SPRAY INTO ONE NOSTRIL AS DIRECTED FOR OPIOID OVERDOSE (TURN PERSON ON SIDE AFTER DOSE. IF NO RESPONSE IN 2-3 MINUTES OR PERSON RESPONDS BUT RELAPSES, REPEAT USING A NEW SPRAY DEVICE AND SPRAY INTO THE OTHER NOSTRIL. CALL 911 AFTER USE.) * EMERGENCY USE ONLY *, Disp: 1 each, Rfl: 0   pantoprazole (PROTONIX) 40 MG tablet, Take 1 tablet (40mg ) twice daily for two weeks and then take 1 tablet daily thereafter, Disp: 60 tablet, Rfl: 3   potassium chloride SA (KLOR-CON) 20 MEQ tablet, TAKE 1 TABLET BY MOUTH TWICE DAILY, Disp: 30 tablet, Rfl: 1   sucralfate (CARAFATE) 1 g tablet, Take 1 tablet (1 g total) by mouth 4 (four) times daily., Disp: 60 tablet, Rfl: 0   LORazepam (ATIVAN) 0.5 MG tablet, Take 1 tablet (0.5  mg total) by mouth every 8 (eight) hours as needed for anxiety., Disp: 60 tablet, Rfl: 0   magic mouthwash (nystatin, lidocaine, diphenhydrAMINE, alum & mag hydroxide) suspension, Swish and spit 5 mLs 3 (three) times daily as needed for mouth pain., Disp: 180 mL, Rfl: 0   morphine (MS CONTIN) 30 MG 12 hr tablet, Take 1 tablet (30 mg total) by mouth every 8 (eight) hours., Disp: 90 tablet, Rfl: 0   naloxegol oxalate (MOVANTIK) 25 MG TABS tablet, Take 1 tablet (25 mg total) by mouth daily., Disp: 30 tablet, Rfl: 2   Oxycodone HCl 20 MG TABS, Take 1 tablet (20 mg total) by mouth every 3 (three) hours as needed (for breakthrough pain)., Disp: 120 tablet, Rfl: 0 No current facility-administered medications for this visit.  Facility-Administered Medications Ordered in Other Visits:    CARBOplatin (PARAPLATIN) 490 mg in sodium chloride 0.9 % 250 mL chemo infusion, 490 mg, Intravenous, Once, Sindy Guadeloupe, MD   PEMEtrexed (ALIMTA) 1,000 mg in sodium chloride 0.9 % 100 mL chemo infusion, 500 mg/m2 (Treatment Plan Recorded), Intravenous, Once, Sindy Guadeloupe, MD, Last Rate: 840 mL/hr at 01/01/21 1041, 1,000 mg at 01/01/21 1041  Physical exam:  Vitals:   01/01/21 0840  BP: 123/78  Pulse: (!) 103  Resp: 18  Temp: 97.7 F (36.5 C)  TempSrc: Tympanic  Weight: 162 lb 8 oz (73.7 kg)  Height: 5\' 10"  (1.778 m)   Physical Exam Constitutional:      General: He is not in acute distress. Cardiovascular:     Rate and Rhythm: Regular rhythm. Tachycardia present.     Heart sounds: Normal heart sounds.  Pulmonary:     Effort: Pulmonary effort is normal.     Breath sounds: Normal breath sounds.  Abdominal:     General: Bowel sounds are normal.     Palpations: Abdomen is soft.  Skin:    General: Skin is warm and dry.  Neurological:     Mental Status: He is alert and oriented to person, place, and time.     CMP Latest Ref Rng & Units 01/01/2021  Glucose 70 - 99 mg/dL 122(H)  BUN 8 - 23 mg/dL 11   Creatinine 0.61 - 1.24 mg/dL 0.70  Sodium 135 - 145 mmol/L 136  Potassium 3.5 - 5.1 mmol/L 3.4(L)  Chloride 98 - 111 mmol/L 102  CO2 22 - 32 mmol/L 25  Calcium 8.9 - 10.3 mg/dL 8.6(L)  Total Protein 6.5 - 8.1 g/dL 6.5  Total Bilirubin 0.3 - 1.2 mg/dL 0.1(L)  Alkaline Phos  38 - 126 U/L 64  AST 15 - 41 U/L 21  ALT 0 - 44 U/L 13   CBC Latest Ref Rng & Units 01/01/2021  WBC 4.0 - 10.5 K/uL 4.3  Hemoglobin 13.0 - 17.0 g/dL 9.2(L)  Hematocrit 39.0 - 52.0 % 30.1(L)  Platelets 150 - 400 K/uL 549(H)    No images are attached to the encounter.  NM PET Image Restag (PS) Skull Base To Thigh  Result Date: 12/10/2020 CLINICAL DATA:  Follow-up treatment strategy for lung neoplasm. EXAM: NUCLEAR MEDICINE PET SKULL BASE TO THIGH TECHNIQUE: 9.39 mCi F-18 FDG was injected intravenously. Full-ring PET imaging was performed from the skull base to thigh after the radiotracer. CT data was obtained and used for attenuation correction and anatomic localization. Fasting blood glucose: 112 mg/dl COMPARISON:  Chest CT dated December 01, 2019; PET-CT dated September 16, 2019 FINDINGS: Mediastinal blood pool activity: SUV max 2.5 Liver activity: SUV max NA NECK: No hypermetabolic lymph nodes in the neck. Incidental CT findings: none CHEST: Hypermetabolic lower paratracheal lymph node measuring 8 mm in short axis on series 3, image 74 with an SUV max of 3.7. Hypermetabolic subcarinal lymph node measuring 1.1 cm in short axis on image 94 with an SUV max of 8.5. Hypermetabolic lymph nodes which were previously seen on prior PET-CT are no longer apparent. Left hilar masslike opacity is unchanged in appearance when compared with most recent prior CT and demonstrates hypermetabolic activity, which is most pronounced in the left upper lobe, although the left hilar opacity is incompletely assessed with PET given early termination of exam, SUV max of 10.3. Mildly hypermetabolic irregular left upper lobe solid nodule measuring 1.0 cm  on series 3, image 66 with SUV max of 2.7, finding is new compared to prior PET-CT. Incidental CT findings: Right chest wall port with tip near the superior cavoatrial junction. Atherosclerotic disease of the thoracic aorta. ABDOMEN/PELVIS: NA Incidental CT findings: Evaluation the upper abdomen is somewhat limited due to motion artifact. Low-attenuation lesion of the right kidney which is likely a simple cyst. Trace free fluid in the pelvis. Atherosclerotic disease of the abdominal aorta. Cystic lesion of the tail of the pancreas measuring 1.0 cm on image 145, measured 8 mm on prior exam, likely slightly increased in size. Prostatomegaly. SKELETON: No focal hypermetabolic activity of the skull, cervical spine or upper chest. Incidental CT findings: none IMPRESSION: 1. Incomplete exam, terminated early due to patient claustrophobia. PET images only obtained for the neck and upper portion of the chest. 2. Left hilar masslike opacity demonstrates marked hypermetabolic activity which is concerning for recurrent disease. 3. Hypermetabolic paratracheal and subcarinal lymph nodes which are new compared to prior PET-CT and concerning for metastatic disease. 4. Hypermetabolic lymph nodes which were previously seen on prior PET-CT dated September 16, 2019 are no longer apparent. 5. Mildly hypermetabolic irregular solid left upper lobe nodule, concerning for additional site of metastatic disease. 6. Aortic Atherosclerosis (ICD10-I70.0). Electronically Signed   By: Yetta Glassman M.D.   On: 12/10/2020 16:19     Assessment and plan- Patient is a 70 y.o. 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  He was treated with CarboTaxol chemotherapy along with radiation.  Recently noted to have hilar recurrence.  He is here for on treatment assessment prior to cycle 2 of carbo Alimta chemotherapy  Counts okay to proceed with cycle 2 of carbo Alimta chemotherapy today.  In 3 weeks he will receive cycle 3 and will  be seen by covering NP.  Repeat CT chest abdomen pelvis with contrast sometime in 4 weeks and I will see him in 6 weeks for cycle 4 of carbo Alimta chemotherapy.  He has good response on scans I plan to continue maintenance Alimta alone given that this is his recurrent disease after initial round of CarboTaxol radiation and maintenance durvalumab.  He will receive his B12 injection with cycle 4  Neoplasm related pain: Continue long-acting morphine and as needed oxycodone.  Anxiety: Continue as needed Ativan   Visit Diagnosis 1. Malignant neoplasm of lower lobe of left lung ([place])   2. Encounter for antineoplastic chemotherapy   3. Neoplasm related pain   4. Anxiety      Dr. Randa Evens, MD, MPH [place] at [facility] [phone] 01/01/2021 10:45 AM

## 2021-01-02 ENCOUNTER — Other Ambulatory Visit: Payer: Self-pay | Admitting: Oncology

## 2021-01-03 ENCOUNTER — Encounter: Payer: Self-pay | Admitting: Oncology

## 2021-01-11 ENCOUNTER — Telehealth: Payer: Self-pay

## 2021-01-11 NOTE — Telephone Encounter (Signed)
Called and spoke to patient about upcoming COVID test. Patient had a clear understanding. Nothing further needed.

## 2021-01-13 ENCOUNTER — Other Ambulatory Visit
Admission: RE | Admit: 2021-01-13 | Discharge: 2021-01-13 | Disposition: A | Discharge status: Home / Self Care | Payer: Medicaid Other | Source: Ambulatory Visit | Attending: Primary Care | Admitting: Primary Care

## 2021-01-13 ENCOUNTER — Other Ambulatory Visit: Payer: Self-pay

## 2021-01-13 DIAGNOSIS — Z20822 Contact with and (suspected) exposure to covid-19: Secondary | ICD-10-CM | POA: Diagnosis not present

## 2021-01-13 DIAGNOSIS — Z01812 Encounter for preprocedural laboratory examination: Secondary | ICD-10-CM | POA: Insufficient documentation

## 2021-01-14 ENCOUNTER — Ambulatory Visit: Payer: Medicaid Other | Attending: Primary Care

## 2021-01-14 DIAGNOSIS — R0602 Shortness of breath: Secondary | ICD-10-CM | POA: Diagnosis present

## 2021-01-14 LAB — SARS CORONAVIRUS 2 (TAT 6-24 HRS): SARS Coronavirus 2: NEGATIVE

## 2021-01-14 MED ORDER — ALBUTEROL SULFATE (2.5 MG/3ML) 0.083% IN NEBU
2.5000 mg | INHALATION_SOLUTION | Freq: Once | RESPIRATORY_TRACT | Status: AC
  Administered 2021-01-14: 2.5 mg via RESPIRATORY_TRACT
  Filled 2021-01-14: qty 3

## 2021-01-14 NOTE — Progress Notes (Unsigned)
Patient arrived for appointment tachypnea and  Anxious. Checked O2 sat and HR. O2 sat 100% HR 114. RR 40. Patient denies being nervous or SOB. Patient was difficult to evaluate and although he seemed to be trying he could not follow directions well enough to perform test.  VS rechecked when leaving. HR 105, O2 sat 100%, RR 36. Patient taken to main entrance via wheelchair.

## 2021-01-18 ENCOUNTER — Ambulatory Visit
Admission: RE | Admit: 2021-01-18 | Discharge: 2021-01-18 | Disposition: A | Discharge status: Home / Self Care | Payer: Medicaid Other | Source: Ambulatory Visit | Attending: Radiation Oncology | Admitting: Radiation Oncology

## 2021-01-18 ENCOUNTER — Inpatient Hospital Stay: Payer: Medicare Other | Attending: Radiation Oncology

## 2021-01-18 ENCOUNTER — Encounter: Payer: Self-pay | Admitting: Radiation Oncology

## 2021-01-18 ENCOUNTER — Other Ambulatory Visit: Payer: Self-pay

## 2021-01-18 VITALS — BP 127/80 | HR 85 | Temp 96.7°F | Wt 163.6 lb

## 2021-01-18 DIAGNOSIS — Z87891 Personal history of nicotine dependence: Secondary | ICD-10-CM | POA: Insufficient documentation

## 2021-01-18 DIAGNOSIS — C3432 Malignant neoplasm of lower lobe, left bronchus or lung: Secondary | ICD-10-CM

## 2021-01-18 DIAGNOSIS — G893 Neoplasm related pain (acute) (chronic): Secondary | ICD-10-CM | POA: Insufficient documentation

## 2021-01-18 DIAGNOSIS — N281 Cyst of kidney, acquired: Secondary | ICD-10-CM | POA: Insufficient documentation

## 2021-01-18 DIAGNOSIS — K862 Cyst of pancreas: Secondary | ICD-10-CM | POA: Insufficient documentation

## 2021-01-18 DIAGNOSIS — F419 Anxiety disorder, unspecified: Secondary | ICD-10-CM | POA: Insufficient documentation

## 2021-01-18 DIAGNOSIS — R059 Cough, unspecified: Secondary | ICD-10-CM | POA: Insufficient documentation

## 2021-01-18 DIAGNOSIS — Z9221 Personal history of antineoplastic chemotherapy: Secondary | ICD-10-CM | POA: Diagnosis not present

## 2021-01-18 DIAGNOSIS — E785 Hyperlipidemia, unspecified: Secondary | ICD-10-CM | POA: Insufficient documentation

## 2021-01-18 DIAGNOSIS — Z5112 Encounter for antineoplastic immunotherapy: Secondary | ICD-10-CM | POA: Insufficient documentation

## 2021-01-18 DIAGNOSIS — Z5111 Encounter for antineoplastic chemotherapy: Secondary | ICD-10-CM | POA: Insufficient documentation

## 2021-01-18 DIAGNOSIS — J90 Pleural effusion, not elsewhere classified: Secondary | ICD-10-CM | POA: Insufficient documentation

## 2021-01-18 DIAGNOSIS — I10 Essential (primary) hypertension: Secondary | ICD-10-CM | POA: Insufficient documentation

## 2021-01-18 DIAGNOSIS — Z923 Personal history of irradiation: Secondary | ICD-10-CM | POA: Diagnosis not present

## 2021-01-18 DIAGNOSIS — Z79899 Other long term (current) drug therapy: Secondary | ICD-10-CM | POA: Insufficient documentation

## 2021-01-18 DIAGNOSIS — R0602 Shortness of breath: Secondary | ICD-10-CM | POA: Insufficient documentation

## 2021-01-18 NOTE — Progress Notes (Signed)
Radiation Oncology Follow up Note  Name: Mitchell Frye   Date:   01/18/2021 MRN:  [id number] DOB: [birth year]    This 70 y.o. male presents to the clinic today for 1 month follow-up status post salvage radiation therapy to his chest and patient with known stage IIIb non-small cell lung cancer left hilum previously treated almost 2 years prior.  REFERRING PROVIDER: Center, [employer]*  HPI: Patient is a 70 year old male now at 1 month having completed palliative radiation therapy to his chest.  He was having excruciating pain and the patient now out close to 2 years having completed concurrent chemoradiation therapy for stage IIIb non-small cell lung cancer of the left hilum.  Seen today in routine follow-up follow-up the pain is markedly improved.  He is having no significant cough hemoptysis or chest tightness.Marland Kitchen  He is currently receiving chemotherapy through medical oncology of carbo platinum and Alimta.Marland Kitchen  He is currently on narcotic analgesics for continued pain although is markedly improved.  COMPLICATIONS OF TREATMENT: none  FOLLOW UP COMPLIANCE: keeps appointments   PHYSICAL EXAM:  BP 127/80   Pulse 85   Temp (!) 96.7 F (35.9 C) (Tympanic)   Wt 163 lb 9.6 oz (74.2 kg)   BMI 23.47 kg/m  Well-developed well-nourished patient in NAD. HEENT reveals PERLA, EOMI, discs not visualized.  Oral cavity is clear. No oral mucosal lesions are identified. Neck is clear without evidence of cervical or supraclavicular adenopathy. Lungs are clear to A&P. Cardiac examination is essentially unremarkable with regular rate and rhythm without murmur rub or thrill. Abdomen is benign with no organomegaly or masses noted. Motor sensory and DTR levels are equal and symmetric in the upper and lower extremities. Cranial nerves II through XII are grossly intact. Proprioception is intact. No peripheral adenopathy or edema is identified. No motor or sensory levels are noted. Crude visual fields are within normal  range.  RADIOLOGY RESULTS: No current films for review  PLAN: Present time patient is achieved a good palliation of his chest pain.  He continues treatment under medical oncology's direction.  I have asked to see him back in 4 to 5 months for follow-up.  I would be happy to reevaluate him should any further palliative treatment be indicated.  I would like to take this opportunity to thank you for allowing me to participate in the care of your patient.Noreene Filbert, MD

## 2021-01-22 ENCOUNTER — Inpatient Hospital Stay (HOSPITAL_BASED_OUTPATIENT_CLINIC_OR_DEPARTMENT_OTHER): Payer: Medicare Other | Admitting: Nurse Practitioner

## 2021-01-22 ENCOUNTER — Inpatient Hospital Stay: Payer: Medicare Other

## 2021-01-22 ENCOUNTER — Encounter: Payer: Self-pay | Admitting: Nurse Practitioner

## 2021-01-22 ENCOUNTER — Other Ambulatory Visit: Payer: Self-pay

## 2021-01-22 VITALS — BP 120/83 | HR 79 | Temp 99.0°F | Resp 16 | Wt 163.0 lb

## 2021-01-22 DIAGNOSIS — C3432 Malignant neoplasm of lower lobe, left bronchus or lung: Secondary | ICD-10-CM

## 2021-01-22 DIAGNOSIS — E785 Hyperlipidemia, unspecified: Secondary | ICD-10-CM | POA: Diagnosis not present

## 2021-01-22 DIAGNOSIS — Z79899 Other long term (current) drug therapy: Secondary | ICD-10-CM | POA: Diagnosis not present

## 2021-01-22 DIAGNOSIS — Z5112 Encounter for antineoplastic immunotherapy: Secondary | ICD-10-CM

## 2021-01-22 DIAGNOSIS — Z87891 Personal history of nicotine dependence: Secondary | ICD-10-CM | POA: Diagnosis not present

## 2021-01-22 DIAGNOSIS — J90 Pleural effusion, not elsewhere classified: Secondary | ICD-10-CM | POA: Diagnosis not present

## 2021-01-22 DIAGNOSIS — Z5111 Encounter for antineoplastic chemotherapy: Secondary | ICD-10-CM

## 2021-01-22 DIAGNOSIS — N281 Cyst of kidney, acquired: Secondary | ICD-10-CM | POA: Diagnosis not present

## 2021-01-22 DIAGNOSIS — G893 Neoplasm related pain (acute) (chronic): Secondary | ICD-10-CM | POA: Diagnosis not present

## 2021-01-22 DIAGNOSIS — R059 Cough, unspecified: Secondary | ICD-10-CM | POA: Diagnosis not present

## 2021-01-22 DIAGNOSIS — R0602 Shortness of breath: Secondary | ICD-10-CM | POA: Diagnosis not present

## 2021-01-22 DIAGNOSIS — K862 Cyst of pancreas: Secondary | ICD-10-CM | POA: Diagnosis not present

## 2021-01-22 DIAGNOSIS — F419 Anxiety disorder, unspecified: Secondary | ICD-10-CM | POA: Diagnosis not present

## 2021-01-22 DIAGNOSIS — I10 Essential (primary) hypertension: Secondary | ICD-10-CM | POA: Diagnosis not present

## 2021-01-22 LAB — COMPREHENSIVE METABOLIC PANEL
ALT: 17 U/L (ref 0–44)
AST: 19 U/L (ref 15–41)
Albumin: 2.9 g/dL — ABNORMAL LOW (ref 3.5–5.0)
Alkaline Phosphatase: 64 U/L (ref 38–126)
Anion gap: 10 (ref 5–15)
BUN: 16 mg/dL (ref 8–23)
CO2: 25 mmol/L (ref 22–32)
Calcium: 8.8 mg/dL — ABNORMAL LOW (ref 8.9–10.3)
Chloride: 101 mmol/L (ref 98–111)
Creatinine, Ser: 0.73 mg/dL (ref 0.61–1.24)
GFR, Estimated: >60 mL/min (ref >60)
Glucose, Bld: 90 mg/dL (ref 70–99)
Potassium: 4.1 mmol/L (ref 3.5–5.1)
Sodium: 136 mmol/L (ref 135–145)
Total Bilirubin: 0.3 mg/dL (ref 0.3–1.2)
Total Protein: 6.5 g/dL (ref 6.5–8.1)

## 2021-01-22 LAB — CBC WITH DIFFERENTIAL/PLATELET
Abs Immature Granulocytes: 0.04 10*3/uL (ref 0.00–0.07)
Basophils Absolute: 0 10*3/uL (ref 0.0–0.1)
Basophils Relative: 0 %
Eosinophils Absolute: 0 10*3/uL (ref 0.0–0.5)
Eosinophils Relative: 0 %
HCT: 29.8 % — ABNORMAL LOW (ref 39.0–52.0)
Hemoglobin: 9.1 g/dL — ABNORMAL LOW (ref 13.0–17.0)
Immature Granulocytes: 1 %
Lymphocytes Relative: 16 %
Lymphs Abs: 0.8 10*3/uL (ref 0.7–4.0)
MCH: 24.4 pg — ABNORMAL LOW (ref 26.0–34.0)
MCHC: 30.5 g/dL (ref 30.0–36.0)
MCV: 79.9 fL — ABNORMAL LOW (ref 80.0–100.0)
Monocytes Absolute: 0.9 10*3/uL (ref 0.1–1.0)
Monocytes Relative: 19 %
Neutro Abs: 3 10*3/uL (ref 1.7–7.7)
Neutrophils Relative %: 64 %
Platelets: 431 10*3/uL — ABNORMAL HIGH (ref 150–400)
RBC: 3.73 MIL/uL — ABNORMAL LOW (ref 4.22–5.81)
RDW: 21.6 % — ABNORMAL HIGH (ref 11.5–15.5)
WBC: 4.8 10*3/uL (ref 4.0–10.5)
nRBC: 0 % (ref 0.0–0.2)

## 2021-01-22 MED ORDER — SODIUM CHLORIDE 0.9 % IV SOLN
150.0000 mg | Freq: Once | INTRAVENOUS | Status: AC
  Administered 2021-01-22: 150 mg via INTRAVENOUS
  Filled 2021-01-22: qty 150

## 2021-01-22 MED ORDER — HEPARIN SOD (PORK) LOCK FLUSH 100 UNIT/ML IV SOLN
INTRAVENOUS | Status: AC
  Filled 2021-01-22: qty 5

## 2021-01-22 MED ORDER — SODIUM CHLORIDE 0.9 % IV SOLN
Freq: Once | INTRAVENOUS | Status: AC
Start: 2021-01-22 — End: 2021-01-22
  Filled 2021-01-22: qty 250

## 2021-01-22 MED ORDER — PALONOSETRON HCL INJECTION 0.25 MG/5ML
0.2500 mg | Freq: Once | INTRAVENOUS | Status: AC
  Administered 2021-01-22: 0.25 mg via INTRAVENOUS
  Filled 2021-01-22: qty 5

## 2021-01-22 MED ORDER — SODIUM CHLORIDE 0.9 % IV SOLN
10.0000 mg | Freq: Once | INTRAVENOUS | Status: AC
  Administered 2021-01-22: 10 mg via INTRAVENOUS
  Filled 2021-01-22: qty 10

## 2021-01-22 MED ORDER — SODIUM CHLORIDE 0.9 % IV SOLN
500.0000 mg/m2 | Freq: Once | INTRAVENOUS | Status: AC
  Administered 2021-01-22: 1000 mg via INTRAVENOUS
  Filled 2021-01-22: qty 40

## 2021-01-22 MED ORDER — SODIUM CHLORIDE 0.9% FLUSH
10.0000 mL | INTRAVENOUS | Status: DC | PRN
  Administered 2021-01-22: 10 mL via INTRAVENOUS
  Filled 2021-01-22: qty 10

## 2021-01-22 MED ORDER — HEPARIN SOD (PORK) LOCK FLUSH 100 UNIT/ML IV SOLN
500.0000 [IU] | Freq: Once | INTRAVENOUS | Status: AC | PRN
  Administered 2021-01-22: 500 [IU]
  Filled 2021-01-22: qty 5

## 2021-01-22 MED ORDER — SODIUM CHLORIDE 0.9 % IV SOLN
494.5000 mg | Freq: Once | INTRAVENOUS | Status: AC
  Administered 2021-01-22: 490 mg via INTRAVENOUS
  Filled 2021-01-22: qty 49

## 2021-01-22 MED ORDER — CYANOCOBALAMIN 1000 MCG/ML IJ SOLN
1000.0000 ug | Freq: Once | INTRAMUSCULAR | Status: AC
  Administered 2021-01-22: 1000 ug via INTRAMUSCULAR
  Filled 2021-01-22: qty 1

## 2021-01-22 MED ORDER — SODIUM CHLORIDE 0.9% FLUSH
10.0000 mL | INTRAVENOUS | Status: DC | PRN
  Filled 2021-01-22: qty 10

## 2021-01-22 NOTE — Patient Instructions (Signed)
[facility]CER [facility] AT [place]  Discharge Instructions: Thank you for choosing [facility] to provide your oncology and hematology care.  If you have a lab appointment with the [facility], please go directly to the [facility] and check in at the registration area.  Wear comfortable clothing and clothing appropriate for easy access to any Portacath or PICC line.   We strive to give you quality time with your provider. You may need to reschedule your appointment if you arrive late (15 or more minutes).  Arriving late affects you and other patients whose appointments are after yours.  Also, if you miss three or more appointments without notifying the office, you may be dismissed from the clinic at the provider's discretion.      For prescription refill requests, have your pharmacy contact our office and allow 72 hours for refills to be completed.    Today you received the following chemotherapy and/or immunotherapy agents - Alimta, carboplatin      To help prevent nausea and vomiting after your treatment, we encourage you to take your nausea medication as directed.  BELOW ARE SYMPTOMS THAT SHOULD BE REPORTED IMMEDIATELY: *FEVER GREATER THAN 100.4 F (38 C) OR HIGHER *CHILLS OR SWEATING *NAUSEA AND VOMITING THAT IS NOT CONTROLLED WITH YOUR NAUSEA MEDICATION *UNUSUAL SHORTNESS OF BREATH *UNUSUAL BRUISING OR BLEEDING *URINARY PROBLEMS (pain or burning when urinating, or frequent urination) *BOWEL PROBLEMS (unusual diarrhea, constipation, pain near the anus) TENDERNESS IN MOUTH AND THROAT WITH OR WITHOUT PRESENCE OF ULCERS (sore throat, sores in mouth, or a toothache) UNUSUAL RASH, SWELLING OR PAIN  UNUSUAL VAGINAL DISCHARGE OR ITCHING   Items with * indicate a potential emergency and should be followed up as soon as possible or go to the Emergency Department if any problems should occur.  Please show the CHEMOTHERAPY ALERT CARD or IMMUNOTHERAPY ALERT CARD at  check-in to the Emergency Department and triage nurse.  Should you have questions after your visit or need to cancel or reschedule your appointment, please contact [facility] CANCER [place] AT [place]  [phone] and follow the prompts.  Office hours are 8:00 a.m. to 4:30 p.m. Monday - Friday. Please note that voicemails left after 4:00 p.m. may not be returned until the following business day.  We are closed weekends and major holidays. You have access to a nurse at all times for urgent questions. Please call the main number to the clinic [phone] and follow the prompts.  For any non-urgent questions, you may also contact your provider using MyChart. We now offer e-Visits for anyone 41 and older to request care online for non-urgent symptoms. For details visit [email or web address].   Also download the MyChart app! Go to the app store, search "MyChart", open the app, select [place], and log in with your MyChart username and password.  Due to Covid, a mask is required upon entering the hospital/clinic. If you do not have a mask, one will be given to you upon arrival. For doctor visits, patients may have 1 support person aged 1 or older with them. For treatment visits, patients cannot have anyone with them due to current Covid guidelines and our immunocompromised population.

## 2021-01-22 NOTE — Progress Notes (Signed)
Pt states no new concerns for today's visit.

## 2021-01-22 NOTE — Progress Notes (Signed)
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  He underwent CT angios chest which did not show any PE.  Soft tissue attenuation measuring 2.9 x 4 cm centered in the left hilum resulting in abrupt angulation and narrowing of the pulmonary arteries and the central left lower lobe airways.  Additional ipsilateral hilar and subcarinal adenopathy.  No contralateral adenopathy.  Consolidative masslike opacity 3.6 x 3.1 cm in size and contiguous with more central perihilar soft tissue attenuation.  Overall findings concerning for primary bronchogenic carcinoma.    Patient lives with his sister who is his main caregiver.  He does not drive but is independent of his ADLs.  Reports ongoing fatigue and occasional retrosternal chest pain.  He has been evaluated by GI in the past for reflux as well.  Appetie is fair and weight is stable.  Denies any significant shortness of breath at this time.  Patient is an ex-smoker and smoked for about 20 years but quit smoking back in 1994   PET CT scan showed hypermetabolic mass in the left lower lobe with mediastinal adenopathy and right paratracheal adenopathy.  No evidence of distant metastatic disease.  Pathology was consistent with non-small cell lung cancer favor adenocarcinoma.  Cells were positive for TTF-1 and negative  for p40.   Patient started concurrent carbotaxol radiation and then had allergic reaction to Taxol for which she was switched to Abraxane.  Given the short supply of Abraxane patient received carbo Alimta midway through his concurrent chemoradiation.  Scans post chemoradiation showed partial response and patient will be started on maintenance durvalumab on 01/14/2020.  Patient was on maintenance durvalumab and received 15 cycles up until August 2022.  He was noted to have worsening chest pain and was in the ER multiple times in September 2022.  Repeat CT scan showed concern for hilar recurrence.    Interval history-patient is 69 year old male with above history of recurrent adenocarcinoma of the lung who returns to clinic for labs, further evaluation, and consideration of cycle 3 of carbo Alimta.Chest pain has improved. Continues to complain of anxiety. No shortness of breath.    ECOG PS- 1 Pain scale- 3 Opioid associated constipation- no  Review of systems- Review of Systems  Constitutional:  Positive for malaise/fatigue. Negative for chills, fever and weight loss.  HENT:  Negative for congestion, ear discharge and nosebleeds.   Eyes:  Negative for blurred vision.  Respiratory:  Positive for shortness of breath. Negative for cough, hemoptysis, sputum production and wheezing.   Cardiovascular:  Positive for chest pain. Negative for palpitations, orthopnea and claudication.  Gastrointestinal:  Negative for abdominal pain, blood in stool, constipation, diarrhea, heartburn, melena, nausea and vomiting.  Genitourinary:  Negative for dysuria, flank pain, frequency, hematuria and urgency.  Musculoskeletal:  Negative for back pain, joint pain and myalgias.  Skin:  Negative for rash.  Neurological:  Negative for dizziness, tingling, focal weakness,  seizures, weakness and headaches.  Endo/Heme/Allergies:  Does not bruise/bleed easily.  Psychiatric/Behavioral:  Negative for depression and suicidal  ideas. The patient does not have insomnia.      Allergies  Allergen Reactions   Taxol [Paclitaxel] Other (See Comments)    Chest pain, hip pain, coughing , wheezing    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 ([place])     Past Surgical History:  Procedure Laterality Date   ESOPHAGOGASTRODUODENOSCOPY (EGD) WITH PROPOFOL N/A 12/01/2020   Procedure: ESOPHAGOGASTRODUODENOSCOPY (EGD) WITH PROPOFOL;  Surgeon: Lin Landsman, MD;  Location: [facility] ENDOSCOPY;  Service: Gastroenterology;  Laterality: N/A;   PORTA CATH INSERTION N/A 10/22/2019   Procedure: PORTA CATH INSERTION;  Surgeon: Katha Cabal, MD;  Location: [facility];  Service: Cardiovascular;  Laterality: N/A;   VIDEO BRONCHOSCOPY WITH ENDOBRONCHIAL ULTRASOUND N/A 09/25/2019   Procedure: VIDEO BRONCHOSCOPY WITH ENDOBRONCHIAL ULTRASOUND;  Surgeon: Tyler Pita, MD;  Location: [facility] ORS;  Service: Pulmonary;  Laterality: N/A;    Social History   Socioeconomic History   Marital status: Single    Spouse name: Not on file   Number of children: Not on file   Years of education: Not on file   Highest education level: Not on file  Occupational History   Not on file  Tobacco Use   Smoking status: Former    Packs/day: 1.00    Years: 20.00    Pack years: 20.00    Types: Cigarettes    Quit date: 63    Years since quitting: 28.9   Smokeless tobacco: Never  Vaping Use   Vaping Use: Never used  Substance and Sexual Activity   Alcohol use: Not Currently    Comment: not drank any beer in 2 onths   Drug use: Never   Sexual activity: Not on file  Other Topics Concern   Not on file  Social History Narrative   Not on file   Social Determinants of Health   Financial Resource Strain: Not on file  Food Insecurity: Not on file  Transportation Needs: Not on file  Physical Activity: Not on file  Stress: Not on  file  Social Connections: Not on file  Intimate Partner Violence: Not on file    Family History  Problem Relation Age of Onset   Cancer Brother      Current Outpatient Medications:    acetaminophen (TYLENOL) 500 MG tablet, Take 1,000 mg by mouth every 6 (six) hours as needed for mild pain, moderate pain or headache., Disp: , Rfl:    albuterol (VENTOLIN HFA) 108 (90 Base) MCG/ACT inhaler, Inhale 2 puffs into the lungs every 4 (four) hours as needed for wheezing or shortness of breath., Disp: 8 g, Rfl: 1   ANORO ELLIPTA 62.5-25 MCG/ACT AEPB, Inhale 1 puff into the lungs daily., Disp: , Rfl:    Budeson-Glycopyrrol-Formoterol (BREZTRI AEROSPHERE) 160-9-4.8 MCG/ACT AERO, Inhale 2 puffs into the lungs in the morning and at bedtime., Disp: 10.7 g, Rfl: 5   clobetasol cream (TEMOVATE) 0.05 %, Apply topically., Disp: , Rfl:    DULoxetine (CYMBALTA) 30 MG capsule, Take 1 capsule (30 mg total) by mouth daily., Disp: 30 capsule, Rfl: 3   DULoxetine (CYMBALTA) 60 MG capsule, Take 60 mg by mouth daily., Disp: , Rfl:    hydrOXYzine (ATARAX/VISTARIL) 25 MG tablet, Take 25 mg by mouth daily., Disp: ,  Rfl:    lidocaine (LIDODERM) 5 %, Place 1 patch onto the skin daily. Remove & Discard patch within 12 hours or as directed by MD, Disp: 30 patch, Rfl: 0   lidocaine (XYLOCAINE) 2 % solution, SMARTSIG:By Mouth, Disp: , Rfl:    lidocaine-prilocaine (EMLA) cream, Apply 1 application topically as needed., Disp: 30 g, Rfl: 0   LORazepam (ATIVAN) 0.5 MG tablet, Take 1 tablet (0.5 mg total) by mouth every 8 (eight) hours as needed for anxiety., Disp: 60 tablet, Rfl: 0   magic mouthwash (nystatin, lidocaine, diphenhydrAMINE, alum & mag hydroxide) suspension, Swish and spit 5 mLs 3 (three) times daily as needed for mouth pain., Disp: 180 mL, Rfl: 0   morphine (MS CONTIN) 30 MG 12 hr tablet, Take 1 tablet (30 mg total) by mouth every 8 (eight) hours., Disp: 90 tablet, Rfl: 0   naloxegol oxalate (MOVANTIK) 25 MG TABS  tablet, Take 1 tablet (25 mg total) by mouth daily., Disp: 30 tablet, Rfl: 2   naloxone (NARCAN) nasal spray 4 mg/0.1 mL, SPRAY 1 SPRAY INTO ONE NOSTRIL AS DIRECTED FOR OPIOID OVERDOSE (TURN PERSON ON SIDE AFTER DOSE. IF NO RESPONSE IN 2-3 MINUTES OR PERSON RESPONDS BUT RELAPSES, REPEAT USING A NEW SPRAY DEVICE AND SPRAY INTO THE OTHER NOSTRIL. CALL 911 AFTER USE.) * EMERGENCY USE ONLY *, Disp: 1 each, Rfl: 0   nystatin (MYCOSTATIN) 100000 UNIT/ML suspension, Take by mouth., Disp: , Rfl:    Oxycodone HCl 20 MG TABS, Take 1 tablet (20 mg total) by mouth every 3 (three) hours as needed (for breakthrough pain)., Disp: 120 tablet, Rfl: 0   pantoprazole (PROTONIX) 40 MG tablet, Take 1 tablet (40mg ) twice daily for two weeks and then take 1 tablet daily thereafter, Disp: 60 tablet, Rfl: 3   potassium chloride SA (KLOR-CON) 20 MEQ tablet, TAKE 1 TABLET BY MOUTH TWICE DAILY, Disp: 30 tablet, Rfl: 1   sucralfate (CARAFATE) 1 g tablet, Take 1 tablet (1 g total) by mouth 4 (four) times daily., Disp: 60 tablet, Rfl: 0 No current facility-administered medications for this visit.  Facility-Administered Medications Ordered in Other Visits:    sodium chloride flush (NS) 0.9 % injection 10 mL, 10 mL, Intravenous, PRN, Sindy Guadeloupe, MD, 10 mL at 01/22/21 0952  Physical exam:  Vitals:   01/22/21 1007  BP: 120/83  Pulse: 79  Resp: 16  Temp: 99 F (37.2 C)  SpO2: 100%  Weight: 163 lb (73.9 kg)    Physical Exam Constitutional:      General: He is not in acute distress.    Comments: Thin build  Cardiovascular:     Rate and Rhythm: Normal rate and regular rhythm.     Heart sounds: Normal heart sounds.  Pulmonary:     Effort: Pulmonary effort is normal.     Breath sounds: Normal breath sounds.  Abdominal:     General: Bowel sounds are normal.     Palpations: Abdomen is soft.  Skin:    General: Skin is warm and dry.  Neurological:     Mental Status: He is alert and oriented to person, place, and  time.  Psychiatric:        Mood and Affect: Mood is anxious.     CMP Latest Ref Rng & Units 01/22/2021  Glucose 70 - 99 mg/dL 90  BUN 8 - 23 mg/dL 16  Creatinine 0.61 - 1.24 mg/dL 0.73  Sodium 135 - 145 mmol/L 136  Potassium 3.5 - 5.1 mmol/L 4.1  Chloride 98 - 111 mmol/L 101  CO2 22 - 32 mmol/L 25  Calcium 8.9 - 10.3 mg/dL 8.8(L)  Total Protein 6.5 - 8.1 g/dL 6.5  Total Bilirubin 0.3 - 1.2 mg/dL 0.3  Alkaline Phos 38 - 126 U/L 64  AST 15 - 41 U/L 19  ALT 0 - 44 U/L 17   CBC Latest Ref Rng & Units 01/22/2021  WBC 4.0 - 10.5 K/uL 4.8  Hemoglobin 13.0 - 17.0 g/dL 9.1(L)  Hematocrit 39.0 - 52.0 % 29.8(L)  Platelets 150 - 400 K/uL 431(H)    No images are attached to the encounter.  Pulmonary Function Test [facility] Only  Result Date: 01/14/2021         The patient was unable to maintain a sustained exhalation for any of the testing. Difficultly hearing and following directions. Patient had a difficult time performing test Unable to provide accurate results    Assessment and plan- Patient is a 70 y.o. 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  He was treated with CarboTaxol chemotherapy along with radiation.  Recently noted to have hilar recurrence.  He is here for on treatment assessment prior to cycle 3 of carbo Alimta chemotherapy  Counts okay to proceed with cycle 3 of carbo Alimta chemotherapy today. Toelrating treatment well without significant side effects. He will have interval imaging as scheduled on 01/29/2021.  Return to clinic in 6 weeks for labs and consideration of cycle 4 of carbo Alimta, b12 with Dr. Janese Banks.  Neoplasm related pain: Continue long-acting morphine and as needed oxycodone. Pain managed by palliative care.   Anxiety: Continue as needed Ativan  Imaging as scheduled 3 weeks- labs (cbc, cmp, ferritin, iron studies, b12, folate, tsh, ft4), Janese Banks, Carbo-alimta-b12   Visit Diagnosis 1. Encounter for antineoplastic chemotherapy   2. Encounter  for antineoplastic immunotherapy   3. Malignant neoplasm of lower lobe of left lung ([place])    Beckey Rutter, DNP, AGNP-C [place] at [facility] [phone] (clinic)

## 2021-01-29 ENCOUNTER — Other Ambulatory Visit: Payer: Medicaid Other

## 2021-01-29 ENCOUNTER — Other Ambulatory Visit: Payer: Self-pay

## 2021-01-29 ENCOUNTER — Ambulatory Visit
Admission: RE | Admit: 2021-01-29 | Discharge: 2021-01-29 | Disposition: A | Discharge status: Home / Self Care | Payer: Medicaid Other | Source: Ambulatory Visit | Attending: Oncology | Admitting: Oncology

## 2021-01-29 DIAGNOSIS — C3432 Malignant neoplasm of lower lobe, left bronchus or lung: Secondary | ICD-10-CM | POA: Diagnosis present

## 2021-01-29 MED ORDER — IOHEXOL 300 MG/ML  SOLN
100.0000 mL | Freq: Once | INTRAMUSCULAR | Status: AC | PRN
  Administered 2021-01-29: 100 mL via INTRAVENOUS

## 2021-02-11 ENCOUNTER — Other Ambulatory Visit: Payer: Self-pay | Admitting: *Deleted

## 2021-02-11 DIAGNOSIS — G893 Neoplasm related pain (acute) (chronic): Secondary | ICD-10-CM

## 2021-02-12 ENCOUNTER — Inpatient Hospital Stay (HOSPITAL_BASED_OUTPATIENT_CLINIC_OR_DEPARTMENT_OTHER): Payer: Medicare Other | Admitting: Oncology

## 2021-02-12 ENCOUNTER — Encounter: Payer: Self-pay | Admitting: Oncology

## 2021-02-12 ENCOUNTER — Other Ambulatory Visit: Payer: Self-pay

## 2021-02-12 ENCOUNTER — Inpatient Hospital Stay: Payer: Medicare Other

## 2021-02-12 VITALS — BP 126/81 | HR 98 | Temp 97.5°F | Resp 16 | Ht 70.0 in | Wt 160.0 lb

## 2021-02-12 DIAGNOSIS — C3432 Malignant neoplasm of lower lobe, left bronchus or lung: Secondary | ICD-10-CM

## 2021-02-12 DIAGNOSIS — Z5112 Encounter for antineoplastic immunotherapy: Secondary | ICD-10-CM

## 2021-02-12 DIAGNOSIS — Z5111 Encounter for antineoplastic chemotherapy: Secondary | ICD-10-CM

## 2021-02-12 DIAGNOSIS — E538 Deficiency of other specified B group vitamins: Secondary | ICD-10-CM

## 2021-02-12 DIAGNOSIS — G893 Neoplasm related pain (acute) (chronic): Secondary | ICD-10-CM | POA: Diagnosis not present

## 2021-02-12 LAB — COMPREHENSIVE METABOLIC PANEL WITH GFR
ALT: 12 U/L (ref 0–44)
AST: 19 U/L (ref 15–41)
Albumin: 3 g/dL — ABNORMAL LOW (ref 3.5–5.0)
Alkaline Phosphatase: 59 U/L (ref 38–126)
Anion gap: 9 (ref 5–15)
BUN: 12 mg/dL (ref 8–23)
CO2: 23 mmol/L (ref 22–32)
Calcium: 9 mg/dL (ref 8.9–10.3)
Chloride: 106 mmol/L (ref 98–111)
Creatinine, Ser: 0.76 mg/dL (ref 0.61–1.24)
GFR, Estimated: >60 mL/min
Glucose, Bld: 126 mg/dL — ABNORMAL HIGH (ref 70–99)
Potassium: 3.5 mmol/L (ref 3.5–5.1)
Sodium: 138 mmol/L (ref 135–145)
Total Bilirubin: 0.4 mg/dL (ref 0.3–1.2)
Total Protein: 7.3 g/dL (ref 6.5–8.1)

## 2021-02-12 LAB — CBC WITH DIFFERENTIAL/PLATELET
Abs Immature Granulocytes: 0.03 10*3/uL (ref 0.00–0.07)
Basophils Absolute: 0 10*3/uL (ref 0.0–0.1)
Basophils Relative: 0 %
Eosinophils Absolute: 0 10*3/uL (ref 0.0–0.5)
Eosinophils Relative: 0 %
HCT: 30 % — ABNORMAL LOW (ref 39.0–52.0)
Hemoglobin: 9.1 g/dL — ABNORMAL LOW (ref 13.0–17.0)
Immature Granulocytes: 1 %
Lymphocytes Relative: 21 %
Lymphs Abs: 0.8 10*3/uL (ref 0.7–4.0)
MCH: 24.9 pg — ABNORMAL LOW (ref 26.0–34.0)
MCHC: 30.3 g/dL (ref 30.0–36.0)
MCV: 82 fL (ref 80.0–100.0)
Monocytes Absolute: 0.6 10*3/uL (ref 0.1–1.0)
Monocytes Relative: 16 %
Neutro Abs: 2.4 10*3/uL (ref 1.7–7.7)
Neutrophils Relative %: 62 %
Platelets: 404 10*3/uL — ABNORMAL HIGH (ref 150–400)
RBC: 3.66 MIL/uL — ABNORMAL LOW (ref 4.22–5.81)
RDW: 22.6 % — ABNORMAL HIGH (ref 11.5–15.5)
WBC: 3.9 10*3/uL — ABNORMAL LOW (ref 4.0–10.5)
nRBC: 0 % (ref 0.0–0.2)

## 2021-02-12 LAB — IRON AND TIBC
Iron: 49 ug/dL (ref 45–182)
Saturation Ratios: 18 % (ref 17.9–39.5)
TIBC: 274 ug/dL (ref 250–450)
UIBC: 225 ug/dL

## 2021-02-12 LAB — T4, FREE: Free T4: 0.87 ng/dL (ref 0.61–1.12)

## 2021-02-12 LAB — FERRITIN: Ferritin: 220 ng/mL (ref 24–336)

## 2021-02-12 LAB — FOLATE: Folate: 6.2 ng/mL (ref >5.9)

## 2021-02-12 LAB — VITAMIN B12: Vitamin B-12: 723 pg/mL (ref 180–914)

## 2021-02-12 LAB — TSH: TSH: 0.796 u[IU]/mL (ref 0.350–4.500)

## 2021-02-12 MED ORDER — SODIUM CHLORIDE 0.9 % IV SOLN
500.0000 mg/m2 | Freq: Once | INTRAVENOUS | Status: AC
  Administered 2021-02-12: 1000 mg via INTRAVENOUS
  Filled 2021-02-12: qty 40

## 2021-02-12 MED ORDER — CYANOCOBALAMIN 1000 MCG/ML IJ SOLN
1000.0000 ug | INTRAMUSCULAR | Status: DC
  Administered 2021-02-12: 1000 ug via INTRAMUSCULAR
  Filled 2021-02-12: qty 1

## 2021-02-12 MED ORDER — SODIUM CHLORIDE 0.9 % IV SOLN
10.0000 mg | Freq: Once | INTRAVENOUS | Status: AC
  Administered 2021-02-12: 10 mg via INTRAVENOUS
  Filled 2021-02-12: qty 1

## 2021-02-12 MED ORDER — SODIUM CHLORIDE 0.9 % IV SOLN
Freq: Once | INTRAVENOUS | Status: AC
  Filled 2021-02-12: qty 250

## 2021-02-12 MED ORDER — OXYCODONE HCL 20 MG PO TABS: 20.0000 mg | ORAL_TABLET | ORAL | 0 refills | Status: DC | PRN

## 2021-02-12 MED ORDER — MORPHINE SULFATE ER 30 MG PO TBCR: 30.0000 mg | EXTENDED_RELEASE_TABLET | Freq: Three times a day (TID) | ORAL | 0 refills | Status: DC

## 2021-02-12 MED ORDER — PALONOSETRON HCL INJECTION 0.25 MG/5ML
0.2500 mg | Freq: Once | INTRAVENOUS | Status: AC
  Administered 2021-02-12: 0.25 mg via INTRAVENOUS
  Filled 2021-02-12: qty 5

## 2021-02-12 MED ORDER — HEPARIN SOD (PORK) LOCK FLUSH 100 UNIT/ML IV SOLN
500.0000 [IU] | Freq: Once | INTRAVENOUS | Status: AC | PRN
  Administered 2021-02-12: 500 [IU]
  Filled 2021-02-12: qty 5

## 2021-02-12 MED ORDER — ALBUTEROL SULFATE HFA 108 (90 BASE) MCG/ACT IN AERS: 2.0000 | INHALATION_SPRAY | RESPIRATORY_TRACT | 1 refills | Status: DC | PRN

## 2021-02-12 MED ORDER — SODIUM CHLORIDE 0.9 % IV SOLN
494.5000 mg | Freq: Once | INTRAVENOUS | Status: AC
  Administered 2021-02-12: 490 mg via INTRAVENOUS
  Filled 2021-02-12: qty 49

## 2021-02-12 MED ORDER — LIDOCAINE-PRILOCAINE 2.5-2.5 % EX CREA: 1.0000 "application " | TOPICAL_CREAM | CUTANEOUS | 3 refills | Status: DC | PRN

## 2021-02-12 MED ORDER — SODIUM CHLORIDE 0.9 % IV SOLN
150.0000 mg | Freq: Once | INTRAVENOUS | Status: AC
  Administered 2021-02-12: 150 mg via INTRAVENOUS
  Filled 2021-02-12: qty 5

## 2021-02-12 MED ORDER — SODIUM CHLORIDE 0.9% FLUSH
10.0000 mL | INTRAVENOUS | Status: DC | PRN
  Filled 2021-02-12: qty 10

## 2021-02-12 NOTE — Addendum Note (Signed)
Addended by: Luella Cook on: 02/12/2021 02:14 PM   Modules accepted: Orders

## 2021-02-12 NOTE — Progress Notes (Signed)
Hematology/Oncology Consult note 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  He underwent CT angios chest which did not show any PE.  Soft tissue attenuation measuring 2.9 x 4 cm centered in the left hilum resulting in abrupt angulation and narrowing of the pulmonary arteries and the central left lower lobe airways.  Additional ipsilateral hilar and subcarinal adenopathy.  No contralateral adenopathy.  Consolidative masslike opacity 3.6 x 3.1 cm in size and contiguous with more central perihilar soft tissue attenuation.  Overall findings concerning for primary bronchogenic carcinoma.    Patient lives with his sister who is his main caregiver.  He does not drive but is independent of his ADLs.  Reports ongoing fatigue and occasional retrosternal chest pain.  He has been evaluated by GI in the past for reflux as well.  Appetie is fair and weight is stable.  Denies any significant shortness of breath at this time.  Patient is an ex-smoker and smoked for about 20 years but quit smoking back in 1994   PET CT scan showed hypermetabolic mass in the left lower lobe with mediastinal adenopathy and right paratracheal adenopathy.  No evidence of distant metastatic disease.  Pathology was consistent with non-small cell lung cancer favor adenocarcinoma.  Cells were positive for TTF-1 and  negative for p40.   Patient started concurrent carbotaxol radiation and then had allergic reaction to Taxol for which she was switched to Abraxane.  Given the short supply of Abraxane patient received carbo Alimta midway through his concurrent chemoradiation.  Scans post chemoradiation showed partial response and patient will be started on maintenance durvalumab on 01/14/2020.  Patient was on maintenance durvalumab and received 15 cycles up until August 2022.  He was noted to have worsening chest pain and was in the ER multiple times in September 2022.  Repeat CT scan showed concern for hilar recurrence.    Interval history-patient reports that his chest wall pain is better controlled with morphine and oxycodone regimen.  He has baseline fatigue and exertional shortness of breath which is overall stable.  ECOG PS- 1 Pain scale- 3 Opioid associated constipation- no  Review of systems- Review of Systems  Constitutional:  Positive for malaise/fatigue. Negative for chills, fever and weight loss.  HENT:  Negative for congestion, ear discharge and nosebleeds.   Eyes:  Negative for blurred vision.  Respiratory:  Positive for shortness of breath. Negative for cough, hemoptysis, sputum production and wheezing.   Cardiovascular:  Negative for chest pain, palpitations, orthopnea and claudication.  Gastrointestinal:  Negative for abdominal pain, blood in stool, constipation, diarrhea, heartburn, melena, nausea and vomiting.  Genitourinary:  Negative for dysuria, flank pain, frequency, hematuria and urgency.  Musculoskeletal:  Negative for back pain, joint pain and myalgias.  Skin:  Negative for rash.  Neurological:  Negative for dizziness, tingling, focal weakness, seizures, weakness and headaches.  Endo/Heme/Allergies:  Does not bruise/bleed easily.  Psychiatric/Behavioral:  Negative  for depression and suicidal ideas. The patient does not have insomnia.      Allergies  Allergen Reactions   Taxol  [Paclitaxel] Other (See Comments)    Chest pain, hip pain, coughing , wheezing     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 ([place])      Past Surgical History:  Procedure Laterality Date   ESOPHAGOGASTRODUODENOSCOPY (EGD) WITH PROPOFOL N/A 12/01/2020   Procedure: ESOPHAGOGASTRODUODENOSCOPY (EGD) WITH PROPOFOL;  Surgeon: Lin Landsman, MD;  Location: [facility] ENDOSCOPY;  Service: Gastroenterology;  Laterality: N/A;   PORTA CATH INSERTION N/A 10/22/2019   Procedure: PORTA CATH INSERTION;  Surgeon: Katha Cabal, MD;  Location: [place] [facility];  Service: Cardiovascular;  Laterality: N/A;   VIDEO BRONCHOSCOPY WITH ENDOBRONCHIAL ULTRASOUND N/A 09/25/2019   Procedure: VIDEO BRONCHOSCOPY WITH ENDOBRONCHIAL ULTRASOUND;  Surgeon: Tyler Pita, MD;  Location: [facility] ORS;  Service: Pulmonary;  Laterality: N/A;    Social History   Socioeconomic History   Marital status: Single    Spouse name: Not on file   Number of children: Not on file   Years of education: Not on file   Highest education level: Not on file  Occupational History   Not on file  Tobacco Use   Smoking status: Former    Packs/day: 1.00    Years: 20.00    Pack years: 20.00    Types: Cigarettes    Quit date: 69    Years since quitting: 29.0   Smokeless tobacco: Never  Vaping Use   Vaping Use: Never used  Substance and Sexual Activity   Alcohol use: Not Currently    Comment: not drank any beer in 2 onths   Drug use: Never   Sexual activity: Not on file  Other Topics Concern   Not on file  Social History Narrative   Not on file   Social Determinants of Health   Financial Resource Strain: Not on file  Food Insecurity: Not on file  Transportation Needs: Not on file  Physical Activity: Not on file  Stress: Not on file  Social Connections: Not on file  Intimate Partner Violence: Not on file     Family History  Problem Relation Age of Onset   Cancer Brother      Current Outpatient Medications:    acetaminophen (TYLENOL) 500 MG tablet, Take 1,000 mg by mouth every 6 (six) hours as needed for mild pain, moderate pain or headache., Disp: , Rfl:    albuterol (VENTOLIN HFA) 108 (90 Base) MCG/ACT inhaler, Inhale 2 puffs into the lungs every 4 (four) hours as needed for wheezing or shortness of breath., Disp: 8 g, Rfl: 1   ANORO ELLIPTA 62.5-25 MCG/ACT AEPB, Inhale 1 puff into the lungs daily., Disp: , Rfl:    Budeson-Glycopyrrol-Formoterol (BREZTRI AEROSPHERE) 160-9-4.8 MCG/ACT AERO, Inhale 2 puffs into the lungs in the morning and at bedtime., Disp: 10.7 g, Rfl: 5   clobetasol cream (TEMOVATE) 0.05 %, Apply topically., Disp: , Rfl:    DULoxetine (CYMBALTA) 30 MG capsule, Take 1 capsule (30 mg total) by mouth daily., Disp: 30 capsule, Rfl: 3   DULoxetine (CYMBALTA) 60 MG capsule, Take 60 mg by mouth daily., Disp: , Rfl:    hydrOXYzine (ATARAX/VISTARIL) 25 MG tablet, Take 25 mg by mouth daily., Disp: , Rfl:    lidocaine (LIDODERM) 5 %, Place 1 patch onto the  skin daily. Remove & Discard patch within 12 hours or as directed by MD, Disp: 30 patch, Rfl: 0   lidocaine-prilocaine (EMLA) cream, Apply 1 application topically as needed., Disp: 30 g, Rfl: 0   morphine (MS CONTIN) 30 MG 12 hr tablet, Take 1 tablet (30 mg total) by mouth every 8 (eight) hours., Disp: 90 tablet, Rfl: 0   naloxegol oxalate (MOVANTIK) 25 MG TABS tablet, Take 1 tablet (25 mg total) by mouth daily., Disp: 30 tablet, Rfl: 2   naloxone (NARCAN) nasal spray 4 mg/0.1 mL, SPRAY 1 SPRAY INTO ONE NOSTRIL AS DIRECTED FOR OPIOID OVERDOSE (TURN PERSON ON SIDE AFTER DOSE. IF NO RESPONSE IN 2-3 MINUTES OR PERSON RESPONDS BUT RELAPSES, REPEAT USING A NEW SPRAY DEVICE AND SPRAY INTO THE OTHER NOSTRIL. CALL 911 AFTER USE.) * EMERGENCY USE ONLY *, Disp: 1 each, Rfl: 0   nystatin (MYCOSTATIN) 100000 UNIT/ML suspension, Take by mouth.,  Disp: , Rfl:    Oxycodone HCl 20 MG TABS, Take 1 tablet (20 mg total) by mouth every 3 (three) hours as needed (for breakthrough pain)., Disp: 120 tablet, Rfl: 0   pantoprazole (PROTONIX) 40 MG tablet, Take 1 tablet (40mg ) twice daily for two weeks and then take 1 tablet daily thereafter, Disp: 60 tablet, Rfl: 3   potassium chloride SA (KLOR-CON) 20 MEQ tablet, TAKE 1 TABLET BY MOUTH TWICE DAILY, Disp: 30 tablet, Rfl: 1   sucralfate (CARAFATE) 1 g tablet, Take 1 tablet (1 g total) by mouth 4 (four) times daily., Disp: 60 tablet, Rfl: 0   lidocaine (XYLOCAINE) 2 % solution, SMARTSIG:By Mouth (Patient not taking: Reported on 02/12/2021), Disp: , Rfl:    LORazepam (ATIVAN) 0.5 MG tablet, Take 1 tablet (0.5 mg total) by mouth every 8 (eight) hours as needed for anxiety., Disp: 60 tablet, Rfl: 0 No current facility-administered medications for this visit.  Facility-Administered Medications Ordered in Other Visits:    cyanocobalamin ((VITAMIN B-12)) injection 1,000 mcg, 1,000 mcg, Intramuscular, Q30 days, Sindy Guadeloupe, MD, 1,000 mcg at 02/12/21 1222   sodium chloride flush (NS) 0.9 % injection 10 mL, 10 mL, Intracatheter, PRN, Sindy Guadeloupe, MD  Physical exam:  Vitals:   02/12/21 0959  BP: 126/81  Pulse: 98  Resp: 16  Temp: (!) 97.5 F (36.4 C)  TempSrc: Oral  Weight: 160 lb (72.6 kg)  Height: 5\' 10"  (1.778 m)   Physical Exam Constitutional:      General: He is not in acute distress. Cardiovascular:     Rate and Rhythm: Normal rate and regular rhythm.     Heart sounds: Normal heart sounds.  Pulmonary:     Effort: Pulmonary effort is normal.     Breath sounds: Normal breath sounds.  Abdominal:     General: Bowel sounds are normal.     Palpations: Abdomen is soft.  Skin:    General: Skin is warm and dry.  Neurological:     Mental Status: He is alert and oriented to person, place, and time.     CMP Latest Ref Rng & Units 02/12/2021  Glucose 70 - 99 mg/dL 126(H)  BUN 8 - 23  mg/dL 12  Creatinine 0.61 - 1.24 mg/dL 0.76  Sodium 135 - 145 mmol/L 138  Potassium 3.5 - 5.1 mmol/L 3.5  Chloride 98 - 111 mmol/L 106  CO2 22 - 32 mmol/L 23  Calcium 8.9 - 10.3 mg/dL 9.0  Total Protein 6.5 - 8.1 g/dL 7.3  Total Bilirubin 0.3 - 1.2 mg/dL 0.4  Alkaline Phos 38 - 126 U/L 59  AST 15 - 41 U/L 19  ALT 0 - 44 U/L 12   CBC Latest Ref Rng & Units 02/12/2021  WBC 4.0 - 10.5 K/uL 3.9(L)  Hemoglobin 13.0 - 17.0 g/dL 9.1(L)  Hematocrit 39.0 - 52.0 % 30.0(L)  Platelets 150 - 400 K/uL 404(H)    No images are attached to the encounter.  CT CHEST ABDOMEN PELVIS W CONTRAST  Result Date: 01/29/2021 CLINICAL DATA:  Left lower lobe lung cancer.  Restaging. EXAM: CT CHEST, ABDOMEN, AND PELVIS WITH CONTRAST TECHNIQUE: Multidetector CT imaging of the chest, abdomen and pelvis was performed following the standard protocol during bolus administration of intravenous contrast. CONTRAST:  159mL OMNIPAQUE IOHEXOL 300 MG/ML  SOLN COMPARISON:  PET-CT 12/08/2020 FINDINGS: CT CHEST FINDINGS Cardiovascular: The heart size is normal. No substantial pericardial effusion. Mild atherosclerotic calcification is noted in the wall of the thoracic aorta. Right Port-A-Cath tip is positioned at the SVC/RA junction. Mediastinum/Nodes: Small mediastinal lymph nodes evident. No right hilar lymphadenopathy. Left hilum obscured by progressive soft tissue attenuation. The esophagus has normal imaging features. There is no axillary lymphadenopathy. Lungs/Pleura: Persistent volume loss left hemithorax. Interval progression of airway impaction to the posterior left upper lobe with ill-defined clustered nodularity (image 31/3). Airspace disease in the suprahilar left upper lobe is progressive in the interval compare image 57/3 today to image 60/3 previously. Confluent soft tissue in the left hilum appears markedly progressive. Progressive confluent soft tissue in the infrahilar left lower lobe around the inferior pulmonary vein  is progressive. Small left pleural effusion is new in the interval no new suspicious pulmonary nodule or mass in the right lung. Musculoskeletal: No chest wall mass or suspicious bone lesions identified. CT ABDOMEN PELVIS FINDINGS Hepatobiliary: No suspicious focal abnormality within the liver parenchyma. There is no evidence for gallstones, gallbladder wall thickening, or pericholecystic fluid. No intrahepatic or extrahepatic biliary dilation. Pancreas: Stable 14 mm cystic lesion without main duct dilatation. Spleen: No splenomegaly. No focal mass lesion. Adrenals/Urinary Tract: No adrenal nodule or mass. Stable appearance bilateral renal cysts. Tiny hypoattenuating lesions in the left kidney are too small to characterize but likely benign. No evidence for hydroureter. The urinary bladder appears normal for the degree of distention. Stomach/Bowel: Stomach is unremarkable. No gastric wall thickening. No evidence of outlet obstruction. Duodenum is normally positioned as is the ligament of Treitz. No small bowel wall thickening. No small bowel dilatation. The terminal ileum is normal. The appendix is not well visualized, but there is no edema or inflammation in the region of the cecum. No gross colonic mass. No colonic wall thickening. Moderate stool volume throughout. Vascular/Lymphatic: There is moderate atherosclerotic calcification of the abdominal aorta without aneurysm. There is no gastrohepatic or hepatoduodenal ligament lymphadenopathy. No retroperitoneal or mesenteric lymphadenopathy. No pelvic sidewall lymphadenopathy. Reproductive: Stable low-attenuation in the anterior left prostate gland, nonspecific. Other: No intraperitoneal free fluid. Musculoskeletal: No worrisome lytic or sclerotic osseous abnormality. IMPRESSION: 1. Interval progression of confluent soft tissue in the left hilum and infrahilar left lower lobe with interval progression of airway impaction and associated nodularity to the posterior  left upper lobe. Imaging features may reflect progression of post treatment changes, but close attention on follow-up recommended as disease progression also a concern. 2. New small left pleural effusion. 3. No evidence for metastatic disease in the abdomen or pelvis. 4. Stable 14 mm cystic lesion without main duct dilatation. Attention on follow-up recommended. 5. Aortic Atherosclerosis (ICD10-I70.0). Electronically Signed  By: Misty Stanley M.D.   On: 01/29/2021 13:32   Pulmonary Function Test [facility] Only  Result Date: 01/14/2021         The patient was unable to maintain a sustained exhalation for any of the testing. Difficultly hearing and following directions. Patient had a difficult time performing test Unable to provide accurate results    Assessment and plan- Patient is a 70 y.o. 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  Subsequently found to have hilar recurrence.  He is here for on treatment assessment prior to cycle 4 of carbo Alimta chemotherapy  Counts okay to proceed with cycle 4 of carbo Alimta chemotherapy today.  I have reviewed CT chest abdomen and pelvis images independently and discussed findings with the patient.  Although there is interval progression of soft tissue density in the left hilum it is unclear if this truly represents disease progression versus posttreatment radiation changes.  Clinically patient reports better control of his pain.  I therefore plan to continue chemotherapy at this time.  Following 4 cycles of carbo Alimta chemotherapy patient will go on to receive maintenance Alimta alone every 3 weeks.  I will see him back in 3 weeks for cycle 1 of maintenance Alimta  Neoplasm related pain: I have renewed his MS Contin as well as oxycodone today.   Visit Diagnosis 1. Encounter for antineoplastic chemotherapy   2. Neoplasm related pain   3. Malignant neoplasm of lower lobe of left  lung ([place])      Dr. Randa Evens, MD, MPH [facility] [phone] 02/12/2021 1:05 PM

## 2021-02-12 NOTE — Patient Instructions (Signed)
[facility] AT [place]  Discharge Instructions: Thank you for choosing [facility] to provide your oncology and hematology care.  If you have a lab appointment with the [facility], please go directly to the [facility] and check in at the registration area.  Wear comfortable clothing and clothing appropriate for easy access to any Portacath or PICC line.   We strive to give you quality time with your provider. You may need to reschedule your appointment if you arrive late (15 or more minutes).  Arriving late affects you and other patients whose appointments are after yours.  Also, if you miss three or more appointments without notifying the office, you may be dismissed from the clinic at the providers discretion.      For prescription refill requests, have your pharmacy contact our office and allow 72 hours for refills to be completed.    Today you received the following chemotherapy and/or immunotherapy agents Carboplatin & Alimta      To help prevent nausea and vomiting after your treatment, we encourage you to take your nausea medication as directed.  BELOW ARE SYMPTOMS THAT SHOULD BE REPORTED IMMEDIATELY: *FEVER GREATER THAN 100.4 F (38 C) OR HIGHER *CHILLS OR SWEATING *NAUSEA AND VOMITING THAT IS NOT CONTROLLED WITH YOUR NAUSEA MEDICATION *UNUSUAL SHORTNESS OF BREATH *UNUSUAL BRUISING OR BLEEDING *URINARY PROBLEMS (pain or burning when urinating, or frequent urination) *BOWEL PROBLEMS (unusual diarrhea, constipation, pain near the anus) TENDERNESS IN MOUTH AND THROAT WITH OR WITHOUT PRESENCE OF ULCERS (sore throat, sores in mouth, or a toothache) UNUSUAL RASH, SWELLING OR PAIN  UNUSUAL VAGINAL DISCHARGE OR ITCHING   Items with * indicate a potential emergency and should be followed up as soon as possible or go to the Emergency Department if any problems should occur.  Please show the CHEMOTHERAPY ALERT CARD or IMMUNOTHERAPY ALERT CARD at  check-in to the Emergency Department and triage nurse.  Should you have questions after your visit or need to cancel or reschedule your appointment, please contact [facility] [place] AT [place]  [phone] and follow the prompts.  Office hours are 8:00 a.m. to 4:30 p.m. Monday - Friday. Please note that voicemails left after 4:00 p.m. may not be returned until the following business day.  We are closed weekends and major holidays. You have access to a nurse at all times for urgent questions. Please call the main number to the clinic [phone] and follow the prompts.  For any non-urgent questions, you may also contact your provider using MyChart. We now offer e-Visits for anyone 73 and older to request care online for non-urgent symptoms. For details visit [email or web address].   Also download the MyChart app! Go to the app store, search "MyChart", open the app, select [facility], and log in with your MyChart username and password.  Due to Covid, a mask is required upon entering the hospital/clinic. If you do not have a mask, one will be given to you upon arrival. For doctor visits, patients may have 1 support person aged 74 or older with them. For treatment visits, patients cannot have anyone with them due to current Covid guidelines and our immunocompromised population.

## 2021-02-12 NOTE — Progress Notes (Signed)
Patient now living with sister. Sometimes in [place] and sometimes in [place]. He is not sure on some of his meds so I am sending a copy to his sister so we can exactly know what meds he is on and what meds he needs. He says that he gets short of breath when he gets up and walks. I did pulse ox and resting was 98 %. I walked him around the hallways and his sat was 98 to 100 . He says his appetite is not as good as it use to be , encouraged him to drink ensure

## 2021-02-15 NOTE — Progress Notes (Signed)
I did call sister of pt on Friday 12/30 and left message about medications if they are accurate and sent list with pt. And to call me back about what inhalers he uses

## 2021-03-05 ENCOUNTER — Inpatient Hospital Stay: Payer: Medicare Other | Attending: Radiation Oncology

## 2021-03-05 ENCOUNTER — Other Ambulatory Visit: Payer: Self-pay

## 2021-03-05 ENCOUNTER — Inpatient Hospital Stay (HOSPITAL_BASED_OUTPATIENT_CLINIC_OR_DEPARTMENT_OTHER): Payer: Medicare Other | Admitting: Oncology

## 2021-03-05 ENCOUNTER — Inpatient Hospital Stay: Payer: Medicare Other

## 2021-03-05 ENCOUNTER — Encounter: Payer: Self-pay | Admitting: Oncology

## 2021-03-05 VITALS — BP 121/73 | HR 95 | Temp 97.6°F | Resp 18 | Ht 70.0 in | Wt 164.1 lb

## 2021-03-05 DIAGNOSIS — Z87891 Personal history of nicotine dependence: Secondary | ICD-10-CM | POA: Diagnosis not present

## 2021-03-05 DIAGNOSIS — C3432 Malignant neoplasm of lower lobe, left bronchus or lung: Secondary | ICD-10-CM | POA: Diagnosis not present

## 2021-03-05 DIAGNOSIS — Z5111 Encounter for antineoplastic chemotherapy: Secondary | ICD-10-CM | POA: Insufficient documentation

## 2021-03-05 DIAGNOSIS — G893 Neoplasm related pain (acute) (chronic): Secondary | ICD-10-CM | POA: Insufficient documentation

## 2021-03-05 DIAGNOSIS — D6481 Anemia due to antineoplastic chemotherapy: Secondary | ICD-10-CM | POA: Insufficient documentation

## 2021-03-05 DIAGNOSIS — I10 Essential (primary) hypertension: Secondary | ICD-10-CM | POA: Insufficient documentation

## 2021-03-05 DIAGNOSIS — T451X5A Adverse effect of antineoplastic and immunosuppressive drugs, initial encounter: Secondary | ICD-10-CM | POA: Diagnosis not present

## 2021-03-05 DIAGNOSIS — Z79899 Other long term (current) drug therapy: Secondary | ICD-10-CM | POA: Insufficient documentation

## 2021-03-05 DIAGNOSIS — C3431 Malignant neoplasm of lower lobe, right bronchus or lung: Secondary | ICD-10-CM | POA: Insufficient documentation

## 2021-03-05 DIAGNOSIS — E538 Deficiency of other specified B group vitamins: Secondary | ICD-10-CM

## 2021-03-05 LAB — CBC WITH DIFFERENTIAL/PLATELET
Abs Immature Granulocytes: 0.02 10*3/uL (ref 0.00–0.07)
Basophils Absolute: 0 10*3/uL (ref 0.0–0.1)
Basophils Relative: 0 %
Eosinophils Absolute: 0 10*3/uL (ref 0.0–0.5)
Eosinophils Relative: 0 %
HCT: 27.7 % — ABNORMAL LOW (ref 39.0–52.0)
Hemoglobin: 8.5 g/dL — ABNORMAL LOW (ref 13.0–17.0)
Immature Granulocytes: 1 %
Lymphocytes Relative: 20 %
Lymphs Abs: 0.8 10*3/uL (ref 0.7–4.0)
MCH: 25.6 pg — ABNORMAL LOW (ref 26.0–34.0)
MCHC: 30.7 g/dL (ref 30.0–36.0)
MCV: 83.4 fL (ref 80.0–100.0)
Monocytes Absolute: 0.5 10*3/uL (ref 0.1–1.0)
Monocytes Relative: 12 %
Neutro Abs: 2.8 10*3/uL (ref 1.7–7.7)
Neutrophils Relative %: 67 %
Platelets: 302 10*3/uL (ref 150–400)
RBC: 3.32 MIL/uL — ABNORMAL LOW (ref 4.22–5.81)
RDW: 23.5 % — ABNORMAL HIGH (ref 11.5–15.5)
WBC: 4.1 10*3/uL (ref 4.0–10.5)
nRBC: 0 % (ref 0.0–0.2)

## 2021-03-05 LAB — COMPREHENSIVE METABOLIC PANEL
ALT: 10 U/L (ref 0–44)
AST: 22 U/L (ref 15–41)
Albumin: 3 g/dL — ABNORMAL LOW (ref 3.5–5.0)
Alkaline Phosphatase: 55 U/L (ref 38–126)
Anion gap: 7 (ref 5–15)
BUN: 13 mg/dL (ref 8–23)
CO2: 24 mmol/L (ref 22–32)
Calcium: 9 mg/dL (ref 8.9–10.3)
Chloride: 105 mmol/L (ref 98–111)
Creatinine, Ser: 0.83 mg/dL (ref 0.61–1.24)
GFR, Estimated: >60 mL/min (ref >60)
Glucose, Bld: 172 mg/dL — ABNORMAL HIGH (ref 70–99)
Potassium: 3.4 mmol/L — ABNORMAL LOW (ref 3.5–5.1)
Sodium: 136 mmol/L (ref 135–145)
Total Bilirubin: 0.4 mg/dL (ref 0.3–1.2)
Total Protein: 7 g/dL (ref 6.5–8.1)

## 2021-03-05 MED ORDER — FOLIC ACID 1 MG PO TABS: 1.0000 mg | ORAL_TABLET | Freq: Every day | ORAL | 6 refills | Status: DC

## 2021-03-05 MED ORDER — SODIUM CHLORIDE 0.9 % IV SOLN
Freq: Once | INTRAVENOUS | Status: AC
  Filled 2021-03-05: qty 250

## 2021-03-05 MED ORDER — SODIUM CHLORIDE 0.9 % IV SOLN
10.0000 mg | Freq: Once | INTRAVENOUS | Status: AC
  Administered 2021-03-05: 10 mg via INTRAVENOUS
  Filled 2021-03-05: qty 10

## 2021-03-05 MED ORDER — PALONOSETRON HCL INJECTION 0.25 MG/5ML: 0.2500 mg | Freq: Once | INTRAVENOUS | Status: DC

## 2021-03-05 MED ORDER — SODIUM CHLORIDE 0.9 % IV SOLN
500.0000 mg/m2 | Freq: Once | INTRAVENOUS | Status: AC
  Administered 2021-03-05: 1000 mg via INTRAVENOUS
  Filled 2021-03-05: qty 40

## 2021-03-05 MED ORDER — SODIUM CHLORIDE 0.9 % IV SOLN: 150.0000 mg | Freq: Once | INTRAVENOUS | Status: DC

## 2021-03-05 MED ORDER — HEPARIN SOD (PORK) LOCK FLUSH 100 UNIT/ML IV SOLN
500.0000 [IU] | Freq: Once | INTRAVENOUS | Status: AC | PRN
  Administered 2021-03-05: 500 [IU]
  Filled 2021-03-05: qty 5

## 2021-03-05 MED ORDER — CYANOCOBALAMIN 1000 MCG/ML IJ SOLN
1000.0000 ug | INTRAMUSCULAR | Status: DC
  Administered 2021-03-05: 1000 ug via INTRAMUSCULAR
  Filled 2021-03-05: qty 1

## 2021-03-05 MED ORDER — HEPARIN SOD (PORK) LOCK FLUSH 100 UNIT/ML IV SOLN
INTRAVENOUS | Status: AC
  Filled 2021-03-05: qty 5

## 2021-03-05 NOTE — Progress Notes (Signed)
Pt states he felt pain in left chest , he points below his nipple but closer to side area with ribs. He states that he has had multiple falls at home. Has a walker but it has never been put together. He feels like he is not able to get more wt. On him. He eats twice a day. I told him that he should eat 3 times a day and drink ensure and boost. He says he gets it here . I will ask joli

## 2021-03-05 NOTE — Progress Notes (Signed)
Nutrition Follow-up:  Acute add on today, pt requesting shakes.    Patient with lung cancer currently on alimta.  Last seen by RD on 02/27/2020 at that time patient was eating well.   Met with patient during infusion.  Patient reports that appetite is not to good.  Says that he has a bad taste in his mouth and food does not taste right.  Patient lives with sister and sister cooks.  Says that he has not had a bowel movement since Monday.  Unsure what medications he is taking. Sounds like he was given miralax to take.  Says that he drinks ensure/boost when he has some.    Medications: movantik, KCL, protonix, Patient unsure of medications. Nursing sending home medication list to have sister verify.   Labs: reviewed  Anthropometrics:   Height: 70 inches Weight: 164 lb 1.6 oz today  188 lb 02/2020  13% weight loss in the last year, not significant but concerning.    NUTRITION DIAGNOSIS: Inadequate oral intake related to cancer related treatment side effects (taste alterations, poor appetite) as evidenced by 13% weight loss in the last year, poor po intake   INTERVENTION:  Discussed strategies to help with taste change.  Handout provided and encouraged to share with sister. Recommend boost plus BID for added calories and protein.  [place] Medicaid is accepting authorization for oral nutrition supplements in adult patients.  Communication has been sent to provider and nursing regarding required documentation needed for authorization.   RD spoke with sister and sister has RD contact information    MONITORING, EVALUATION, GOAL: weight trends, intake   NEXT VISIT: Friday, FEb 10 during infusion  Aarian Cleaver B. Zenia Resides, [place], [place] Registered Dietitian [phone] (mobile)

## 2021-03-05 NOTE — Patient Instructions (Signed)
[facility] AT [place]  Discharge Instructions: Thank you for choosing [facility] to provide your oncology and hematology care.  If you have a lab appointment with the [facility], please go directly to the [facility] and check in at the registration area.  Wear comfortable clothing and clothing appropriate for easy access to any Portacath or PICC line.   We strive to give you quality time with your provider. You may need to reschedule your appointment if you arrive late (15 or more minutes).  Arriving late affects you and other patients whose appointments are after yours.  Also, if you miss three or more appointments without notifying the office, you may be dismissed from the clinic at the providers discretion.      For prescription refill requests, have your pharmacy contact our office and allow 72 hours for refills to be completed.    Today you received the following chemotherapy and/or immunotherapy agents: Alimta      To help prevent nausea and vomiting after your treatment, we encourage you to take your nausea medication as directed.  BELOW ARE SYMPTOMS THAT SHOULD BE REPORTED IMMEDIATELY: *FEVER GREATER THAN 100.4 F (38 C) OR HIGHER *CHILLS OR SWEATING *NAUSEA AND VOMITING THAT IS NOT CONTROLLED WITH YOUR NAUSEA MEDICATION *UNUSUAL SHORTNESS OF BREATH *UNUSUAL BRUISING OR BLEEDING *URINARY PROBLEMS (pain or burning when urinating, or frequent urination) *BOWEL PROBLEMS (unusual diarrhea, constipation, pain near the anus) TENDERNESS IN MOUTH AND THROAT WITH OR WITHOUT PRESENCE OF ULCERS (sore throat, sores in mouth, or a toothache) UNUSUAL RASH, SWELLING OR PAIN  UNUSUAL VAGINAL DISCHARGE OR ITCHING   Items with * indicate a potential emergency and should be followed up as soon as possible or go to the Emergency Department if any problems should occur.  Please show the CHEMOTHERAPY ALERT CARD or IMMUNOTHERAPY ALERT CARD at check-in to the  Emergency Department and triage nurse.  Should you have questions after your visit or need to cancel or reschedule your appointment, please contact [facility] [place]  [phone] and follow the prompts.  Office hours are 8:00 a.m. to 4:30 p.m. Monday - Friday. Please note that voicemails left after 4:00 p.m. may not be returned until the following business day.  We are closed weekends and major holidays. You have access to a nurse at all times for urgent questions. Please call the main number to the clinic [phone] and follow the prompts.  For any non-urgent questions, you may also contact your provider using MyChart. We now offer e-Visits for anyone 35 and older to request care online for non-urgent symptoms. For details visit [email or web address].   Also download the MyChart app! Go to the app store, search "MyChart", open the app, select [facility], and log in with your MyChart username and password.  Due to Covid, a mask is required upon entering the hospital/clinic. If you do not have a mask, one will be given to you upon arrival. For doctor visits, patients may have 1 support person aged 37 or older with them. For treatment visits, patients cannot have anyone with them due to current Covid guidelines and our immunocompromised population. Pemetrexed injection What is this medication? PEMETREXED (PEM e TREX ed) is a chemotherapy drug used to treat lung cancers like non-small cell lung cancer and mesothelioma. It may also be used to treat other cancers. This medicine may be used for other purposes; ask your health care provider or pharmacist if you have questions. COMMON BRAND  NAME(S): Alimta, PEMFEXY What should I tell my care team before I take this medication? They need to know if you have any of these conditions: infection (especially a virus infection such as chickenpox, cold sores, or herpes) kidney disease low blood counts, like low white cell, platelet,  or red cell counts lung or breathing disease, like asthma radiation therapy an unusual or allergic reaction to pemetrexed, other medicines, foods, dyes, or preservative pregnant or trying to get pregnant breast-feeding How should I use this medication? This drug is given as an infusion into a vein. It is administered in a hospital or clinic by a specially trained health care professional. Talk to your pediatrician regarding the use of this medicine in children. Special care may be needed. Overdosage: If you think you have taken too much of this medicine contact a poison control center or emergency room at once. NOTE: This medicine is only for you. Do not share this medicine with others. What if I miss a dose? It is important not to miss your dose. Call your doctor or health care professional if you are unable to keep an appointment. What may interact with this medication? This medicine may interact with the following medications: Ibuprofen This list may not describe all possible interactions. Give your health care provider a list of all the medicines, herbs, non-prescription drugs, or dietary supplements you use. Also tell them if you smoke, drink alcohol, or use illegal drugs. Some items may interact with your medicine. What should I watch for while using this medication? Visit your doctor for checks on your progress. This drug may make you feel generally unwell. This is not uncommon, as chemotherapy can affect healthy cells as well as cancer cells. Report any side effects. Continue your course of treatment even though you feel ill unless your doctor tells you to stop. In some cases, you may be given additional medicines to help with side effects. Follow all directions for their use. Call your doctor or health care professional for advice if you get a fever, chills or sore throat, or other symptoms of a cold or flu. Do not treat yourself. This drug decreases your body's ability to fight  infections. Try to avoid being around people who are sick. This medicine may increase your risk to bruise or bleed. Call your doctor or health care professional if you notice any unusual bleeding. Be careful brushing and flossing your teeth or using a toothpick because you may get an infection or bleed more easily. If you have any dental work done, tell your dentist you are receiving this medicine. Avoid taking products that contain aspirin, acetaminophen, ibuprofen, naproxen, or ketoprofen unless instructed by your doctor. These medicines may hide a fever. Call your doctor or health care professional if you get diarrhea or mouth sores. Do not treat yourself. To protect your kidneys, drink water or other fluids as directed while you are taking this medicine. Do not become pregnant while taking this medicine or for 6 months after stopping it. Women should inform their doctor if they wish to become pregnant or think they might be pregnant. Men should not father a child while taking this medicine and for 3 months after stopping it. This may interfere with the ability to father a child. You should talk to your doctor or health care professional if you are concerned about your fertility. There is a potential for serious side effects to an unborn child. Talk to your health care professional or pharmacist for more information.  Do not breast-feed an infant while taking this medicine or for 1 week after stopping it. What side effects may I notice from receiving this medication? Side effects that you should report to your doctor or health care professional as soon as possible: allergic reactions like skin rash, itching or hives, swelling of the face, lips, or tongue breathing problems redness, blistering, peeling or loosening of the skin, including inside the mouth signs and symptoms of bleeding such as bloody or black, tarry stools; red or dark-brown urine; spitting up blood or brown material that looks like  coffee grounds; red spots on the skin; unusual bruising or bleeding from the eye, gums, or nose signs and symptoms of infection like fever or chills; cough; sore throat; pain or trouble passing urine signs and symptoms of kidney injury like trouble passing urine or change in the amount of urine signs and symptoms of liver injury like dark yellow or brown urine; general ill feeling or flu-like symptoms; light-colored stools; loss of appetite; nausea; right upper belly pain; unusually weak or tired; yellowing of the eyes or skin Side effects that usually do not require medical attention (report to your doctor or health care professional if they continue or are bothersome): constipation mouth sores nausea, vomiting unusually weak or tired This list may not describe all possible side effects. Call your doctor for medical advice about side effects. You may report side effects to FDA at 1-800-FDA-1088. Where should I keep my medication? This drug is given in a hospital or clinic and will not be stored at home. NOTE: This sheet is a summary. It may not cover all possible information. If you have questions about this medicine, talk to your doctor, pharmacist, or health care provider.  2022 Elsevier/Gold Standard (2017-03-28 00:00:00)

## 2021-03-05 NOTE — Progress Notes (Signed)
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  He underwent CT angios chest which did not show any PE.  Soft tissue attenuation measuring 2.9 x 4 cm centered in the left hilum resulting in abrupt angulation and narrowing of the pulmonary arteries and the central left lower lobe airways.  Additional ipsilateral hilar and subcarinal adenopathy.  No contralateral adenopathy.  Consolidative masslike opacity 3.6 x 3.1 cm in size and contiguous with more central perihilar soft tissue attenuation.  Overall findings concerning for primary bronchogenic carcinoma.    Patient lives with his sister who is his main caregiver.  He does not drive but is independent of his ADLs.  Reports ongoing fatigue and occasional retrosternal chest pain.  He has been evaluated by GI in the past for reflux as well.  Appetie is fair and weight is stable.  Denies any significant shortness of breath at this time.  Patient is an ex-smoker and smoked for about 20 years but quit smoking back in 1994   PET CT scan showed hypermetabolic mass in the left lower lobe with mediastinal adenopathy and right paratracheal adenopathy.  No evidence of distant metastatic disease.  Pathology was consistent with non-small cell lung cancer favor adenocarcinoma.  Cells were positive for TTF-1 and negative for  p40.   Patient started concurrent carbotaxol radiation and then had allergic reaction to Taxol for which she was switched to Abraxane.  Given the short supply of Abraxane patient received carbo Alimta midway through his concurrent chemoradiation.  Scans post chemoradiation showed partial response and patient will be started on maintenance durvalumab on 01/14/2020.  Patient was on maintenance durvalumab and received 15 cycles up until August 2022.  He was noted to have worsening chest pain and was in the ER multiple times in September 2022.  Repeat CT scan showed concern for hilar recurrence.    Interval history-patient states that his midsternal chest pain has improved significantly.  States that he takes his pain medicines but it is a sister who gives it to him and he does not remember how many times a day he has needed it.  He has had 5-6 falls in the last 1 month.  States that it always happens when he tries to get up and go quickly.  However when he plants his movements he does not fall.  He is not using a cane or a walker to ambulate.  He reports mild left chest wall pain after his fall.  He does report feeling constipated but he only uses MiraLAX as needed  ECOG PS- 1 Pain scale- 2 Opioid associated constipation- yes  Review of systems- Review of Systems  Constitutional:  Positive for malaise/fatigue. Negative for chills, fever and weight loss.  HENT:  Negative for congestion, ear discharge and nosebleeds.   Eyes:  Negative for blurred vision.  Respiratory:  Negative for cough, hemoptysis, sputum production, shortness of breath and  wheezing.   Cardiovascular:  Negative for chest pain, palpitations, orthopnea and claudication.  Gastrointestinal:  Negative for abdominal pain, blood in stool, constipation, diarrhea, heartburn, melena, nausea and vomiting.  Genitourinary:  Negative for dysuria, flank pain, frequency, hematuria and urgency.  Musculoskeletal:  Negative for back pain, joint pain  and myalgias.  Skin:  Negative for rash.  Neurological:  Negative for dizziness, tingling, focal weakness, seizures, weakness and headaches.  Endo/Heme/Allergies:  Does not bruise/bleed easily.  Psychiatric/Behavioral:  Negative for depression and suicidal ideas. The patient does not have insomnia.       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 ([place])      Past Surgical History:  Procedure Laterality Date   ESOPHAGOGASTRODUODENOSCOPY (EGD) WITH PROPOFOL N/A 12/01/2020   Procedure: ESOPHAGOGASTRODUODENOSCOPY (EGD) WITH PROPOFOL;  Surgeon: Lin Landsman, MD;  Location: [facility] ENDOSCOPY;  Service: Gastroenterology;  Laterality: N/A;   PORTA CATH INSERTION N/A 10/22/2019   Procedure: PORTA CATH INSERTION;  Surgeon: Katha Cabal, MD;  Location: [facility];  Service: Cardiovascular;  Laterality: N/A;   VIDEO BRONCHOSCOPY WITH ENDOBRONCHIAL ULTRASOUND N/A 09/25/2019   Procedure: VIDEO BRONCHOSCOPY WITH ENDOBRONCHIAL ULTRASOUND;  Surgeon: Tyler Pita, MD;  Location: [facility] ORS;  Service: Pulmonary;  Laterality: N/A;    Social History   Socioeconomic History   Marital status: Single    Spouse name: Not on file   Number of children: Not on file   Years of education: Not on file   Highest education level: Not on file  Occupational History   Not on file  Tobacco Use   Smoking status: Former    Packs/day: 1.00    Years: 20.00    Pack years: 20.00    Types: Cigarettes    Quit date: 68    Years since quitting: 29.0   Smokeless tobacco: Never  Vaping Use   Vaping Use: Never used  Substance and Sexual Activity   Alcohol use: Not Currently    Comment: not drank any beer in 2 onths   Drug use: Never   Sexual activity: Not Currently  Other  Topics Concern   Not on file  Social History Narrative   Not on file   Social Determinants of Health   Financial Resource Strain: Not on file  Food Insecurity: Not on file  Transportation Needs: Not on file  Physical Activity: Not on file  Stress: Not on file  Social Connections: Not on file  Intimate Partner Violence: Not on file    Family History  Problem Relation Age of Onset   Cancer Brother      Current Outpatient Medications:    acetaminophen (TYLENOL) 500 MG tablet, Take 1,000 mg by mouth every 6 (six) hours as needed for mild pain, moderate pain or headache., Disp: , Rfl:    albuterol (VENTOLIN HFA) 108 (90 Base) MCG/ACT inhaler, Inhale 2 puffs into the lungs every 4 (four) hours as needed for wheezing or shortness of breath., Disp: 8 g, Rfl: 1   ANORO ELLIPTA 62.5-25 MCG/ACT AEPB, Inhale 1 puff into the lungs daily., Disp: , Rfl:    clobetasol cream (TEMOVATE) 0.05 %, Apply topically., Disp: , Rfl:    hydrOXYzine (ATARAX/VISTARIL) 25 MG tablet, Take  25 mg by mouth daily., Disp: , Rfl:    lidocaine (LIDODERM) 5 %, Place 1 patch onto the skin daily. Remove & Discard patch within 12 hours or as directed by MD, Disp: 30 patch, Rfl: 0   lidocaine (XYLOCAINE) 2 % solution, , Disp: , Rfl:    lidocaine-prilocaine (EMLA) cream, Apply 1 application topically as needed., Disp: 30 g, Rfl: 3   LORazepam (ATIVAN) 0.5 MG tablet, Take 1 tablet (0.5 mg total) by mouth every 8 (eight) hours as needed for anxiety., Disp: 60 tablet, Rfl: 0   morphine (MS CONTIN) 30 MG 12 hr tablet, Take 1 tablet (30 mg total) by mouth every 8 (eight) hours., Disp: 90 tablet, Rfl: 0   naloxegol oxalate (MOVANTIK) 25 MG TABS tablet, Take 1 tablet (25 mg total) by mouth daily., Disp: 30 tablet, Rfl: 2   naloxone (NARCAN) nasal spray 4 mg/0.1 mL, SPRAY 1 SPRAY INTO ONE NOSTRIL AS DIRECTED FOR OPIOID OVERDOSE (TURN PERSON ON SIDE AFTER DOSE. IF NO RESPONSE IN 2-3 MINUTES OR PERSON RESPONDS BUT RELAPSES, REPEAT  USING A NEW SPRAY DEVICE AND SPRAY INTO THE OTHER NOSTRIL. CALL 911 AFTER USE.) * EMERGENCY USE ONLY *, Disp: 1 each, Rfl: 0   nystatin (MYCOSTATIN) 100000 UNIT/ML suspension, Take by mouth., Disp: , Rfl:    Oxycodone HCl 20 MG TABS, Take 1 tablet (20 mg total) by mouth every 3 (three) hours as needed (for breakthrough pain)., Disp: 120 tablet, Rfl: 0   pantoprazole (PROTONIX) 40 MG tablet, Take 1 tablet (40mg ) twice daily for two weeks and then take 1 tablet daily thereafter, Disp: 60 tablet, Rfl: 3   potassium chloride SA (KLOR-CON) 20 MEQ tablet, TAKE 1 TABLET BY MOUTH TWICE DAILY, Disp: 30 tablet, Rfl: 1   sucralfate (CARAFATE) 1 g tablet, Take 1 tablet (1 g total) by mouth 4 (four) times daily., Disp: 60 tablet, Rfl: 0   Budeson-Glycopyrrol-Formoterol (BREZTRI AEROSPHERE) 160-9-4.8 MCG/ACT AERO, Inhale 2 puffs into the lungs in the morning and at bedtime., Disp: 10.7 g, Rfl: 5   DULoxetine (CYMBALTA) 30 MG capsule, Take 1 capsule (30 mg total) by mouth daily., Disp: 30 capsule, Rfl: 3   DULoxetine (CYMBALTA) 60 MG capsule, Take 60 mg by mouth daily., Disp: , Rfl:   Physical exam:  Vitals:   03/05/21 0857  BP: 121/73  Pulse: 95  Resp: 18  Temp: 97.6 F (36.4 C)  TempSrc: Oral  Weight: 164 lb 1.6 oz (74.4 kg)  Height: 5\' 10"  (1.778 m)   Physical Exam Constitutional:      General: He is not in acute distress. Cardiovascular:     Rate and Rhythm: Normal rate and regular rhythm.     Heart sounds: Normal heart sounds.  Pulmonary:     Effort: Pulmonary effort is normal.     Breath sounds: Normal breath sounds.  Abdominal:     General: Bowel sounds are normal.     Palpations: Abdomen is soft.  Skin:    General: Skin is warm and dry.  Neurological:     Mental Status: He is alert and oriented to person, place, and time.     CMP Latest Ref Rng & Units 03/05/2021  Glucose 70 - 99 mg/dL 172(H)  BUN 8 - 23 mg/dL 13  Creatinine 0.61 - 1.24 mg/dL 0.83  Sodium 135 - 145 mmol/L 136   Potassium 3.5 - 5.1 mmol/L 3.4(L)  Chloride 98 - 111 mmol/L 105  CO2 22 - 32 mmol/L 24  Calcium 8.9 -  10.3 mg/dL 9.0  Total Protein 6.5 - 8.1 g/dL 7.0  Total Bilirubin 0.3 - 1.2 mg/dL 0.4  Alkaline Phos 38 - 126 U/L 55  AST 15 - 41 U/L 22  ALT 0 - 44 U/L 10   CBC Latest Ref Rng & Units 03/05/2021  WBC 4.0 - 10.5 K/uL 4.1  Hemoglobin 13.0 - 17.0 g/dL 8.5(L)  Hematocrit 39.0 - 52.0 % 27.7(L)  Platelets 150 - 400 K/uL 302     Assessment and plan- Patient is a 71 y.o. 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  He was found to have local hilar recurrence and is s/p 4 cycles of carbo Alimta chemotherapy.  He is here for on treatment assessment prior to cycle 1 of maintenance Alimta  Counts okay to proceed with cycle 1 of maintenance Alimta today.  I will see him back in 3 weeks for cycle 2.  Plan is to repeat scans after 3 cycles.  He will also receive B12 injections every 3 weeks.  Chemo induced anemia: Iron studies B12 and folate are normal.  Continue to monitor.  I have asked him to take folic acid without missing his doses.  If anemia continues to be a problem I will need to reduce the dose of Alimta or stretched out every 4 weeks.  Neoplasm related pain: Continue as needed oxycodone and MS Contin.   Visit Diagnosis 1. Encounter for antineoplastic chemotherapy   2. Malignant neoplasm of lower lobe of left lung ([place])   3. Antineoplastic chemotherapy induced anemia      Dr. Randa Evens, MD, MPH [facility] at [facility] [phone] 03/05/2021 8:53 AM

## 2021-03-09 ENCOUNTER — Encounter: Payer: Self-pay | Admitting: Oncology

## 2021-03-10 ENCOUNTER — Inpatient Hospital Stay: Payer: Medicare Other

## 2021-03-10 ENCOUNTER — Other Ambulatory Visit: Payer: Self-pay

## 2021-03-10 ENCOUNTER — Telehealth: Payer: Self-pay | Admitting: *Deleted

## 2021-03-10 ENCOUNTER — Inpatient Hospital Stay: Payer: Medicare Other | Admitting: Occupational Therapy

## 2021-03-10 DIAGNOSIS — R296 Repeated falls: Secondary | ICD-10-CM

## 2021-03-10 DIAGNOSIS — F419 Anxiety disorder, unspecified: Secondary | ICD-10-CM

## 2021-03-10 NOTE — Therapy (Signed)
[facility] at [facility], [place], [place], [address] Phone: [phone]   Fax:  [phone]  Occupational Therapy Screen:  Patient Details  Name: Mitchell Frye MRN: [id number] Date of Birth: 25-Apr-1950 No data recorded  Encounter Date: 03/10/2021   OT End of Session - 03/10/21 1609     Visit Number 0             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 ([place])     Past Surgical History:  Procedure Laterality Date   ESOPHAGOGASTRODUODENOSCOPY (EGD) WITH PROPOFOL N/A 12/01/2020   Procedure: ESOPHAGOGASTRODUODENOSCOPY (EGD) WITH PROPOFOL;  Surgeon: Lin Landsman, MD;  Location: [facility] ENDOSCOPY;  Service: Gastroenterology;  Laterality: N/A;   PORTA CATH INSERTION N/A 10/22/2019   Procedure: PORTA CATH INSERTION;  Surgeon: Katha Cabal, MD;  Location: [facility] CV LAB;  Service: Cardiovascular;  Laterality: N/A;   VIDEO BRONCHOSCOPY WITH ENDOBRONCHIAL ULTRASOUND N/A 09/25/2019   Procedure: VIDEO BRONCHOSCOPY WITH ENDOBRONCHIAL ULTRASOUND;  Surgeon: Tyler Pita, MD;  Location: [facility] ORS;  Service: Pulmonary;  Laterality: N/A;    OT SCREEN 03/10/21:   Subjective Assessment - 03/10/21 1607     Subjective  I did had about 5-6 falls the last month -when I take off quicking getting out of bed or up from chair- but I looked - it happens every time when I take that brown and yellow capsule- I put it on my dresser -but don't know the name -when I get home I'll let my sister call back-I did get walker but it needs to be put together    Currently in Pain? No/denies            Pt arrive alone - walking independently in - no LOB , shuffling notice  Pt strength in hips and knees 5/5  BERG balance test 56/56- low risk for falling  10 m walking test pt score 8 sec- that is WNL  Pt O2 levels stayed 94-97 % -HR  did fluctuate 114 to 146 when he got anxious it did increase - hyper ventilate then  Did purse lip breathing or distract him- and done better Discuss with pt to slow down - don't do everything fast  When getting out of bed or standing out of chair - stand for 5-10 sec before he starts moving  Pt report to OT that he notice every time he takes as brown /yellow capsule he gets dizzy - he put it on his dresser - don't know the name  Talked to DR Janese Banks and pharmacist - pt to call pharmacist give her the name  Also discuss with pt CARE Program - 2 x wk - benefits of it -with his SOB and anxiety - that will be great - they can monitor his vitals Pt appear to be interested but his sister cannot bring him - Dr Elroy Channel nurse -Judeen Hammans - checking with sister and transportation Pt provided with HEP:  Pt was able to walk in clinic more than 1 min and O2 levels stayed 96%  Pt to walk in the house 2 min at time Do side ways stepping in hall way or at kitchen counter  Heel raises  2x 10 reps And sit<> stand out of chair - no arms use -and 2x 10 reps  3-4  x day - active about 5-  6 min 4 x day  Follow up with me as needed - recommend CARE program for pt                 DR RAO NOTE 03/05/21: Interval history-patient states that his midsternal chest pain has improved significantly.  States that he takes his pain medicines but it is a sister who gives it to him and he does not remember how many times a day he has needed it.  He has had 5-6 falls in the last 1 month.  States that it always happens when he tries to get up and go quickly.  However when he plants his movements he does not fall.  He is not using a cane or a walker to ambulate.  He reports mild left chest wall pain after his fall.  He does report feeling constipated but he only uses MiraLAX as needed  Assessment and plan- Patient is a 71 y.o. 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  He was found to have local hilar recurrence and is s/p 4 cycles of carbo Alimta chemotherapy.  He is here for on treatment assessment prior to cycle 1 of maintenance Alimta   Counts okay to proceed with cycle 1 of maintenance Alimta today.  I will see him back in 3 weeks for cycle 2.  Plan is to repeat scans after 3 cycles.  He will also receive B12 injections every 3 weeks.   Chemo induced anemia: Iron studies B12 and folate are normal.  Continue to monitor.  I have asked him to take folic acid without missing his doses.  If anemia continues to be a problem I will need to reduce the dose of Alimta or stretched out every 4 weeks.   Neoplasm related pain: Continue as needed oxycodone and MS Contin.   Visit Diagnosis 1. Encounter for antineoplastic chemotherapy   2. Malignant neoplasm of lower lobe of left lung ([place])   3. 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

## 2021-03-25 ENCOUNTER — Other Ambulatory Visit: Payer: Self-pay | Admitting: *Deleted

## 2021-03-25 DIAGNOSIS — C3432 Malignant neoplasm of lower lobe, left bronchus or lung: Secondary | ICD-10-CM

## 2021-03-26 ENCOUNTER — Ambulatory Visit: Payer: Medicaid Other | Admitting: Internal Medicine

## 2021-03-26 ENCOUNTER — Inpatient Hospital Stay: Payer: Medicare Other

## 2021-03-26 ENCOUNTER — Encounter: Payer: Self-pay | Admitting: Oncology

## 2021-03-26 ENCOUNTER — Other Ambulatory Visit: Payer: Self-pay

## 2021-03-26 ENCOUNTER — Telehealth: Payer: Self-pay | Admitting: Oncology

## 2021-03-26 ENCOUNTER — Inpatient Hospital Stay (HOSPITAL_BASED_OUTPATIENT_CLINIC_OR_DEPARTMENT_OTHER): Payer: Medicare Other | Admitting: Oncology

## 2021-03-26 ENCOUNTER — Other Ambulatory Visit: Payer: Self-pay | Admitting: *Deleted

## 2021-03-26 ENCOUNTER — Inpatient Hospital Stay: Payer: Medicare Other | Attending: Radiation Oncology

## 2021-03-26 VITALS — BP 129/78 | HR 98 | Temp 97.6°F | Resp 16 | Wt 168.0 lb

## 2021-03-26 DIAGNOSIS — G893 Neoplasm related pain (acute) (chronic): Secondary | ICD-10-CM | POA: Diagnosis not present

## 2021-03-26 DIAGNOSIS — Z79899 Other long term (current) drug therapy: Secondary | ICD-10-CM | POA: Insufficient documentation

## 2021-03-26 DIAGNOSIS — E538 Deficiency of other specified B group vitamins: Secondary | ICD-10-CM

## 2021-03-26 DIAGNOSIS — Z5111 Encounter for antineoplastic chemotherapy: Secondary | ICD-10-CM | POA: Insufficient documentation

## 2021-03-26 DIAGNOSIS — C3432 Malignant neoplasm of lower lobe, left bronchus or lung: Secondary | ICD-10-CM

## 2021-03-26 DIAGNOSIS — E876 Hypokalemia: Secondary | ICD-10-CM | POA: Diagnosis not present

## 2021-03-26 DIAGNOSIS — R12 Heartburn: Secondary | ICD-10-CM | POA: Diagnosis not present

## 2021-03-26 LAB — CBC WITH DIFFERENTIAL/PLATELET
Abs Immature Granulocytes: 0.02 10*3/uL (ref 0.00–0.07)
Basophils Absolute: 0 10*3/uL (ref 0.0–0.1)
Basophils Relative: 0 %
Eosinophils Absolute: 0 10*3/uL (ref 0.0–0.5)
Eosinophils Relative: 1 %
HCT: 30.3 % — ABNORMAL LOW (ref 39.0–52.0)
Hemoglobin: 9.1 g/dL — ABNORMAL LOW (ref 13.0–17.0)
Immature Granulocytes: 0 %
Lymphocytes Relative: 20 %
Lymphs Abs: 1 10*3/uL (ref 0.7–4.0)
MCH: 26.1 pg (ref 26.0–34.0)
MCHC: 30 g/dL (ref 30.0–36.0)
MCV: 87.1 fL (ref 80.0–100.0)
Monocytes Absolute: 0.7 10*3/uL (ref 0.1–1.0)
Monocytes Relative: 12 %
Neutro Abs: 3.6 10*3/uL (ref 1.7–7.7)
Neutrophils Relative %: 67 %
Platelets: 459 10*3/uL — ABNORMAL HIGH (ref 150–400)
RBC: 3.48 MIL/uL — ABNORMAL LOW (ref 4.22–5.81)
RDW: 23 % — ABNORMAL HIGH (ref 11.5–15.5)
WBC: 5.3 10*3/uL (ref 4.0–10.5)
nRBC: 0 % (ref 0.0–0.2)

## 2021-03-26 LAB — COMPREHENSIVE METABOLIC PANEL
ALT: 9 U/L (ref 0–44)
AST: 19 U/L (ref 15–41)
Albumin: 3 g/dL — ABNORMAL LOW (ref 3.5–5.0)
Alkaline Phosphatase: 55 U/L (ref 38–126)
Anion gap: 10 (ref 5–15)
BUN: 10 mg/dL (ref 8–23)
CO2: 23 mmol/L (ref 22–32)
Calcium: 9.2 mg/dL (ref 8.9–10.3)
Chloride: 106 mmol/L (ref 98–111)
Creatinine, Ser: 0.79 mg/dL (ref 0.61–1.24)
GFR, Estimated: >60 mL/min (ref >60)
Glucose, Bld: 123 mg/dL — ABNORMAL HIGH (ref 70–99)
Potassium: 3.2 mmol/L — ABNORMAL LOW (ref 3.5–5.1)
Sodium: 139 mmol/L (ref 135–145)
Total Bilirubin: 0.4 mg/dL (ref 0.3–1.2)
Total Protein: 7.1 g/dL (ref 6.5–8.1)

## 2021-03-26 LAB — TSH: TSH: 1.16 u[IU]/mL (ref 0.350–4.500)

## 2021-03-26 MED ORDER — FAMOTIDINE IN NACL 20-0.9 MG/50ML-% IV SOLN
20.0000 mg | Freq: Once | INTRAVENOUS | Status: AC
  Administered 2021-03-26: 20 mg via INTRAVENOUS
  Filled 2021-03-26: qty 50

## 2021-03-26 MED ORDER — SODIUM CHLORIDE 0.9 % IV SOLN
10.0000 mg | Freq: Once | INTRAVENOUS | Status: AC
  Administered 2021-03-26: 10 mg via INTRAVENOUS
  Filled 2021-03-26: qty 10

## 2021-03-26 MED ORDER — SODIUM CHLORIDE 0.9 % IV SOLN
500.0000 mg/m2 | Freq: Once | INTRAVENOUS | Status: AC
  Administered 2021-03-26: 1000 mg via INTRAVENOUS
  Filled 2021-03-26: qty 40

## 2021-03-26 MED ORDER — CYANOCOBALAMIN 1000 MCG/ML IJ SOLN
1000.0000 ug | INTRAMUSCULAR | Status: DC
  Administered 2021-03-26: 1000 ug via INTRAMUSCULAR
  Filled 2021-03-26: qty 1

## 2021-03-26 MED ORDER — HEPARIN SOD (PORK) LOCK FLUSH 100 UNIT/ML IV SOLN
500.0000 [IU] | Freq: Once | INTRAVENOUS | Status: AC | PRN
  Administered 2021-03-26: 500 [IU]
  Filled 2021-03-26: qty 5

## 2021-03-26 MED ORDER — SODIUM CHLORIDE 0.9 % IV SOLN
Freq: Once | INTRAVENOUS | Status: AC
  Filled 2021-03-26: qty 250

## 2021-03-26 NOTE — Patient Instructions (Signed)
[facility] AT [facility]  Discharge Instructions: Thank you for choosing [facility] to provide your oncology and hematology care.  If you have a lab appointment with the [facility], please go directly to the [facility] and check in at the registration area.  Wear comfortable clothing and clothing appropriate for easy access to any Portacath or PICC line.   We strive to give you quality time with your provider. You may need to reschedule your appointment if you arrive late (15 or more minutes).  Arriving late affects you and other patients whose appointments are after yours.  Also, if you miss three or more appointments without notifying the office, you may be dismissed from the clinic at the providers discretion.      For prescription refill requests, have your pharmacy contact our office and allow 72 hours for refills to be completed.    Today you received the following chemotherapy and/or immunotherapy agents : Alimta    To help prevent nausea and vomiting after your treatment, we encourage you to take your nausea medication as directed.  BELOW ARE SYMPTOMS THAT SHOULD BE REPORTED IMMEDIATELY: *FEVER GREATER THAN 100.4 F (38 C) OR HIGHER *CHILLS OR SWEATING *NAUSEA AND VOMITING THAT IS NOT CONTROLLED WITH YOUR NAUSEA MEDICATION *UNUSUAL SHORTNESS OF BREATH *UNUSUAL BRUISING OR BLEEDING *URINARY PROBLEMS (pain or burning when urinating, or frequent urination) *BOWEL PROBLEMS (unusual diarrhea, constipation, pain near the anus) TENDERNESS IN MOUTH AND THROAT WITH OR WITHOUT PRESENCE OF ULCERS (sore throat, sores in mouth, or a toothache) UNUSUAL RASH, SWELLING OR PAIN  UNUSUAL VAGINAL DISCHARGE OR ITCHING   Items with * indicate a potential emergency and should be followed up as soon as possible or go to the Emergency Department if any problems should occur.  Please show the CHEMOTHERAPY ALERT CARD or IMMUNOTHERAPY ALERT CARD at check-in to the  Emergency Department and triage nurse.  Should you have questions after your visit or need to cancel or reschedule your appointment, please contact [facility] [place] AT [place]  [phone] and follow the prompts.  Office hours are 8:00 a.m. to 4:30 p.m. Monday - Friday. Please note that voicemails left after 4:00 p.m. may not be returned until the following business day.  We are closed weekends and major holidays. You have access to a nurse at all times for urgent questions. Please call the main number to the clinic [phone] and follow the prompts.  For any non-urgent questions, you may also contact your provider using MyChart. We now offer e-Visits for anyone 79 and older to request care online for non-urgent symptoms. For details visit [email or web address].   Also download the MyChart app! Go to the app store, search "MyChart", open the app, select [facility], and log in with your MyChart username and password.  Due to Covid, a mask is required upon entering the hospital/clinic. If you do not have a mask, one will be given to you upon arrival. For doctor visits, patients may have 1 support person aged 74 or older with them. For treatment visits, patients cannot have anyone with them due to current Covid guidelines and our immunocompromised population.

## 2021-03-26 NOTE — Telephone Encounter (Signed)
Attempt made to reach patient's sister to make her aware of scans scheduled (no answer and not able to leave a VM).   Left VM with patient with scan appointments and instructions, will also send in the mail.

## 2021-03-26 NOTE — Progress Notes (Signed)
Nutrition Follow-up:  Patient with lung cancer currently receiving alimta.    Met with patient during infusion.  Patient reports heartburn and RN has message MD regarding medication to give patient while in infusion.  Eating peanut butter crackers.  Says that he usually eats sandwiches during the day.  Did receive oral nutrition supplements (4 cases).  Says that he has been drinking them.  Asking how many he can drink a day.      Medications: reviewed  Labs: K 3.2, glucose 123,  Anthropometrics:   Weight 168 lb today, increased 164 lb 1.6 oz on 1/20  188 lb 1 2022    NUTRITION DIAGNOSIS: Inadequate oral intake improving    INTERVENTION:  Encouraged patient to drink at least 2 shakes per day.  RD called sister to speak with her but no answer.  Left message on voicemail with return phone number left.       MONITORING, EVALUATION, GOAL: weight trends, intake   NEXT VISIT: Friday, March 3 during infusion  Beatryce Colombo B. Zenia Resides, [place], [place] Registered Dietitian [phone] (mobile)

## 2021-03-26 NOTE — Progress Notes (Signed)
Pt in for follow up reports has been having heart burn since last might.  Pt is very uncomfortable.

## 2021-03-26 NOTE — Progress Notes (Signed)
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  He underwent CT angios chest which did not show any PE.  Soft tissue attenuation measuring 2.9 x 4 cm centered in the left hilum resulting in abrupt angulation and narrowing of the pulmonary arteries and the central left lower lobe airways.  Additional ipsilateral hilar and subcarinal adenopathy.  No contralateral adenopathy.  Consolidative masslike opacity 3.6 x 3.1 cm in size and contiguous with more central perihilar soft tissue attenuation.  Overall findings concerning for primary bronchogenic carcinoma.    Patient lives with his sister who is his main caregiver.  He does not drive but is independent of his ADLs.  Reports ongoing fatigue and occasional retrosternal chest pain.  He has been evaluated by GI in the past for reflux as well.  Appetie is fair and weight is stable.  Denies any significant shortness of breath at this time.  Patient is an ex-smoker and smoked for about 20 years but quit smoking back in 1994   PET CT scan showed hypermetabolic mass in the left lower lobe with mediastinal adenopathy and right paratracheal adenopathy.  No evidence of distant metastatic disease.  Pathology was consistent with non-small cell lung cancer favor adenocarcinoma.  Cells were positive for TTF-1 and negative  for p40.   Patient started concurrent carbotaxol radiation and then had allergic reaction to Taxol for which she was switched to Abraxane.  Given the short supply of Abraxane patient received carbo Alimta midway through his concurrent chemoradiation.  Scans post chemoradiation showed partial response and patient will be started on maintenance durvalumab on 01/14/2020.  Patient was on maintenance durvalumab and received 15 cycles up until August 2022.  He was noted to have worsening chest pain and was in the ER multiple times in September 2022.  Repeat CT scan showed concern for hilar recurrence.      Interval history-patient reports that his chest wall pain is significantly better but he is complaining of heartburn at present.  Denies any significant nausea or vomiting from chemotherapy.  Reports exertional shortness of breath  ECOG PS- 1 Pain scale- 2 Opioid associated constipation- no  Review of systems- Review of Systems  Constitutional:  Positive for malaise/fatigue. Negative for chills, fever and weight loss.  HENT:  Negative for congestion, ear discharge and nosebleeds.   Eyes:  Negative for blurred vision.  Respiratory:  Positive for shortness of breath. Negative for cough, hemoptysis, sputum production and wheezing.   Cardiovascular:  Negative for chest pain, palpitations, orthopnea and claudication.  Gastrointestinal:  Positive for heartburn. Negative for abdominal pain, blood in stool, constipation, diarrhea, melena, nausea and vomiting.  Genitourinary:  Negative for dysuria, flank pain, frequency, hematuria and urgency.  Musculoskeletal:  Negative for back pain, joint pain and myalgias.  Skin:  Negative for rash.  Neurological:  Negative for dizziness, tingling, focal weakness, seizures, weakness and headaches.  Endo/Heme/Allergies:  Does not bruise/bleed easily.  Psychiatric/Behavioral:  Negative for depression and suicidal ideas. The patient is nervous/anxious. The patient does  not have insomnia.       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 ([place])      Past Surgical History:  Procedure Laterality Date   ESOPHAGOGASTRODUODENOSCOPY (EGD) WITH PROPOFOL N/A 12/01/2020   Procedure: ESOPHAGOGASTRODUODENOSCOPY (EGD) WITH PROPOFOL;  Surgeon: Lin Landsman, MD;  Location: [facility] ENDOSCOPY;  Service: Gastroenterology;  Laterality: N/A;   PORTA CATH INSERTION N/A 10/22/2019   Procedure: PORTA CATH INSERTION;  Surgeon: Katha Cabal, MD;  Location: [facility] CV LAB;  Service: Cardiovascular;  Laterality: N/A;   VIDEO BRONCHOSCOPY WITH ENDOBRONCHIAL ULTRASOUND N/A 09/25/2019   Procedure: VIDEO BRONCHOSCOPY WITH ENDOBRONCHIAL ULTRASOUND;  Surgeon: Tyler Pita, MD;  Location: [facility] ORS;  Service: Pulmonary;  Laterality: N/A;    Social History   Socioeconomic History   Marital status: Single    Spouse name: Not on file   Number of children: Not on file   Years of education: Not on file   Highest education level: Not on file  Occupational History   Not on file  Tobacco Use   Smoking status: Former    Packs/day: 1.00    Years: 20.00    Pack years: 20.00    Types: Cigarettes    Quit date: 19    Years since quitting: 29.1   Smokeless tobacco: Never  Vaping Use   Vaping Use: Never used  Substance and Sexual Activity   Alcohol use: Not Currently    Comment: not drank any beer in 2 onths   Drug use: Never   Sexual activity: Not Currently  Other Topics Concern   Not on file  Social History Narrative   Not on file   Social Determinants of Health   Financial Resource Strain: Not on file  Food Insecurity: Not on file  Transportation Needs: Not on file  Physical Activity: Not on file  Stress: Not on file  Social  Connections: Not on file  Intimate Partner Violence: Not on file    Family History  Problem Relation Age of Onset   Cancer Brother      Current Outpatient Medications:    acetaminophen (TYLENOL) 500 MG tablet, Take 1,000 mg by mouth every 6 (six) hours as needed for mild pain, moderate pain or headache., Disp: , Rfl:    albuterol (VENTOLIN HFA) 108 (90 Base) MCG/ACT inhaler, Inhale 2 puffs into the lungs every 4 (four) hours as needed for wheezing or shortness of breath., Disp: 8 g, Rfl: 1   ANORO ELLIPTA 62.5-25 MCG/ACT AEPB, Inhale 1 puff into the lungs daily., Disp: , Rfl:    Budeson-Glycopyrrol-Formoterol (BREZTRI AEROSPHERE) 160-9-4.8 MCG/ACT AERO, Inhale 2 puffs into the lungs in the morning and at bedtime., Disp: 10.7 g, Rfl: 5   clobetasol cream (TEMOVATE) 0.05 %, Apply topically., Disp: , Rfl:    DULoxetine (CYMBALTA) 30 MG capsule, Take 1 capsule (30 mg total) by mouth daily., Disp: 30 capsule, Rfl: 3   DULoxetine (CYMBALTA) 60 MG capsule, Take 60 mg by mouth daily., Disp: , Rfl:    folic acid (FOLVITE) 1 MG tablet, Take 1 tablet (1 mg total)  by mouth daily., Disp: 30 tablet, Rfl: 6   hydrOXYzine (ATARAX/VISTARIL) 25 MG tablet, Take 25 mg by mouth daily., Disp: , Rfl:    lidocaine (LIDODERM) 5 %, Place 1 patch onto the skin daily. Remove & Discard patch within 12 hours or as directed by MD, Disp: 30 patch, Rfl: 0   lidocaine (XYLOCAINE) 2 % solution, , Disp: , Rfl:    lidocaine-prilocaine (EMLA) cream, Apply 1 application topically as needed., Disp: 30 g, Rfl: 3   LORazepam (ATIVAN) 0.5 MG tablet, Take 1 tablet (0.5 mg total) by mouth every 8 (eight) hours as needed for anxiety., Disp: 60 tablet, Rfl: 0   morphine (MS CONTIN) 30 MG 12 hr tablet, Take 1 tablet (30 mg total) by mouth every 8 (eight) hours., Disp: 90 tablet, Rfl: 0   naloxegol oxalate (MOVANTIK) 25 MG TABS tablet, Take 1 tablet (25 mg total) by mouth daily., Disp: 30 tablet, Rfl: 2   naloxone (NARCAN) nasal spray 4  mg/0.1 mL, SPRAY 1 SPRAY INTO ONE NOSTRIL AS DIRECTED FOR OPIOID OVERDOSE (TURN PERSON ON SIDE AFTER DOSE. IF NO RESPONSE IN 2-3 MINUTES OR PERSON RESPONDS BUT RELAPSES, REPEAT USING A NEW SPRAY DEVICE AND SPRAY INTO THE OTHER NOSTRIL. CALL 911 AFTER USE.) * EMERGENCY USE ONLY *, Disp: 1 each, Rfl: 0   nystatin (MYCOSTATIN) 100000 UNIT/ML suspension, Take by mouth., Disp: , Rfl:    Oxycodone HCl 20 MG TABS, Take 1 tablet (20 mg total) by mouth every 3 (three) hours as needed (for breakthrough pain)., Disp: 120 tablet, Rfl: 0   pantoprazole (PROTONIX) 40 MG tablet, Take 1 tablet (40mg ) twice daily for two weeks and then take 1 tablet daily thereafter, Disp: 60 tablet, Rfl: 3   potassium chloride SA (KLOR-CON) 20 MEQ tablet, TAKE 1 TABLET BY MOUTH TWICE DAILY, Disp: 30 tablet, Rfl: 1   sucralfate (CARAFATE) 1 g tablet, Take 1 tablet (1 g total) by mouth 4 (four) times daily., Disp: 60 tablet, Rfl: 0 No current facility-administered medications for this visit.  Facility-Administered Medications Ordered in Other Visits:    cyanocobalamin ((VITAMIN B-12)) injection 1,000 mcg, 1,000 mcg, Intramuscular, Q30 days, Sindy Guadeloupe, MD, 1,000 mcg at 03/26/21 1053  Physical exam:  Vitals:   03/26/21 0900  BP: 129/78  Pulse: 98  Resp: 16  Temp: 97.6 F (36.4 C)  TempSrc: Tympanic  Weight: 168 lb (76.2 kg)   Physical Exam Constitutional:      General: He is not in acute distress. Cardiovascular:     Rate and Rhythm: Regular rhythm. Tachycardia present.     Heart sounds: Normal heart sounds.  Pulmonary:     Effort: Pulmonary effort is normal.     Breath sounds: Normal breath sounds.  Abdominal:     General: Bowel sounds are normal.     Palpations: Abdomen is soft.  Skin:    General: Skin is warm and dry.  Neurological:     Mental Status: He is alert and oriented to person, place, and time.     CMP Latest Ref Rng & Units 03/26/2021  Glucose 70 - 99 mg/dL 123(H)  BUN 8 - 23 mg/dL 10   Creatinine 0.61 - 1.24 mg/dL 0.79  Sodium 135 - 145 mmol/L 139  Potassium 3.5 - 5.1 mmol/L 3.2(L)  Chloride 98 - 111 mmol/L 106  CO2 22 - 32 mmol/L 23  Calcium 8.9 - 10.3 mg/dL 9.2  Total Protein 6.5 - 8.1 g/dL 7.1  Total Bilirubin 0.3 - 1.2 mg/dL  0.4  Alkaline Phos 38 - 126 U/L 55  AST 15 - 41 U/L 19  ALT 0 - 44 U/L 9   CBC Latest Ref Rng & Units 03/26/2021  WBC 4.0 - 10.5 K/uL 5.3  Hemoglobin 13.0 - 17.0 g/dL 9.1(L)  Hematocrit 39.0 - 52.0 % 30.3(L)  Platelets 150 - 400 K/uL 459(H)    Assessment and plan- Patient is a 71 y.o. 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  He was found to have local hilar recurrence and is s/p 4 cycles of carbo Alimta chemotherapy.  He is here for on treatment assessment prior to cycle 2 of maintenance Alimta  Counts okay to proceed with cycle 2 of maintenance Alimta today.  He will also get his B12 injection today.  I will see him back in 3 weeks for cycle 3 of maintenance Alimta.  I will obtain CT chest abdomen pelvis with contrast and bone scan prior.  Heartburn: Continue p.o. Protonix and we will give him IV famotidine prior to chemo as well.  Hypokalemia: Continue p.o. potassium  Neoplasm related pain: Continue as needed oxycodone   Visit Diagnosis 1. Encounter for antineoplastic chemotherapy   2. Malignant neoplasm of lower lobe of left lung ([place])   3. Hypokalemia   4. Neoplasm related pain      Dr. Randa Evens, MD, MPH [facility] [phone] 03/26/2021 12:45 PM

## 2021-04-09 ENCOUNTER — Other Ambulatory Visit: Payer: Self-pay

## 2021-04-09 ENCOUNTER — Ambulatory Visit
Admission: RE | Admit: 2021-04-09 | Discharge: 2021-04-09 | Disposition: A | Discharge status: Home / Self Care | Payer: Medicare Other | Source: Ambulatory Visit | Attending: Oncology | Admitting: Oncology

## 2021-04-09 ENCOUNTER — Encounter
Admission: RE | Admit: 2021-04-09 | Discharge: 2021-04-09 | Disposition: A | Discharge status: Home / Self Care | Payer: Medicare Other | Source: Ambulatory Visit | Attending: Oncology | Admitting: Oncology

## 2021-04-09 DIAGNOSIS — C3432 Malignant neoplasm of lower lobe, left bronchus or lung: Secondary | ICD-10-CM | POA: Diagnosis present

## 2021-04-09 IMAGING — CT CT CHEST-ABD-PELV W/ CM
2 of 5 series · 12 of 36 positions shown, 14 images · IV contrast (agent unspecified)
Comparison: 01/29/2021

CLINICAL DATA: Lung cancer restaging.

EXAM:
CT CHEST, ABDOMEN, AND PELVIS WITH CONTRAST
TECHNIQUE: Multidetector CT imaging of the chest, abdomen and pelvis was
performed following the standard protocol during bolus
administration of intravenous contrast.

[Series 2: cap with · axial · 0.76mm/px · z∈[-594,-74]mm · 9 of 132 slices shown, 11 images]
[im 14/132  mediastinal]
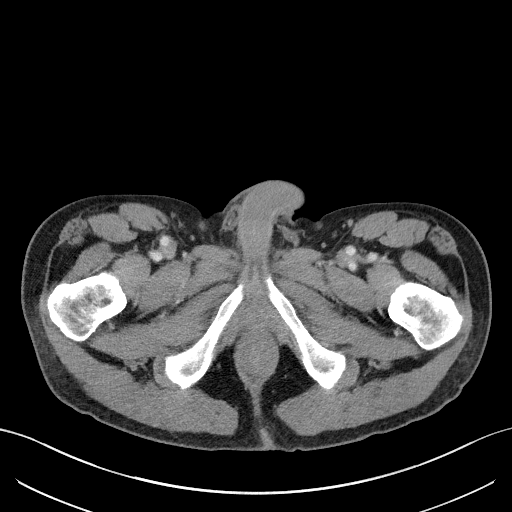
[im 14/132  bone]
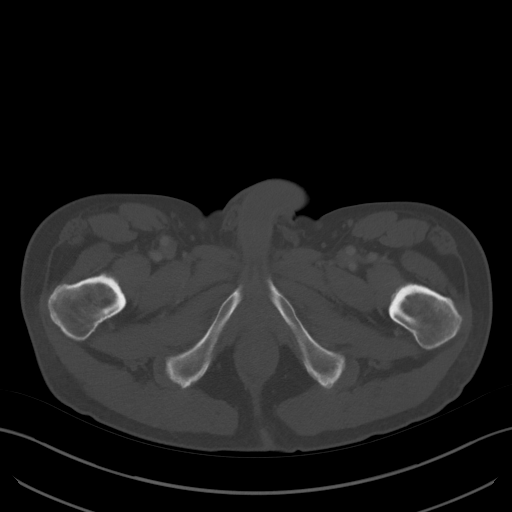
[im 27/132  mediastinal]
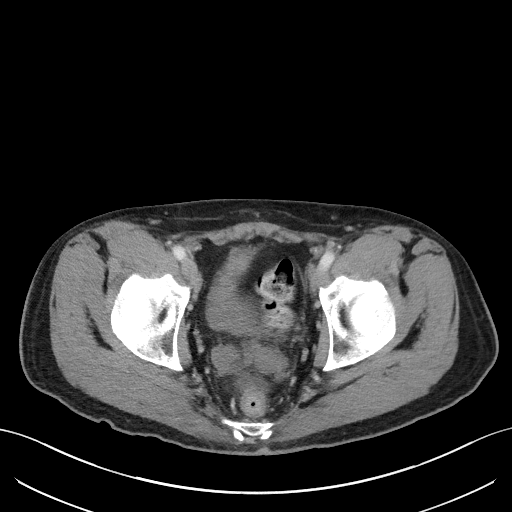
[im 40/132  mediastinal]
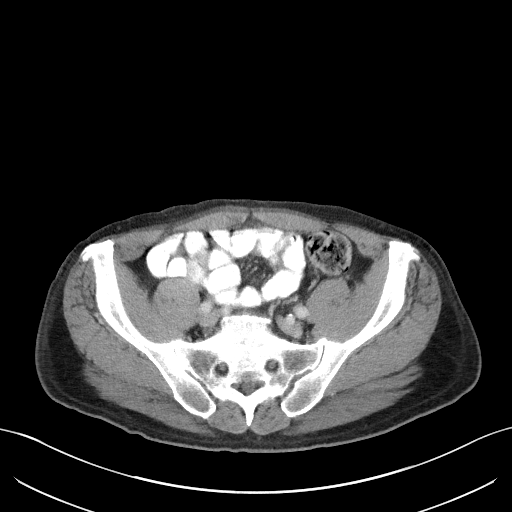
[im 53/132  mediastinal]
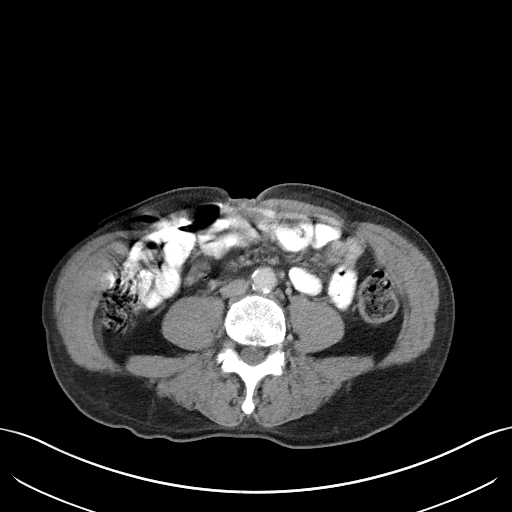
[im 66/132  mediastinal]
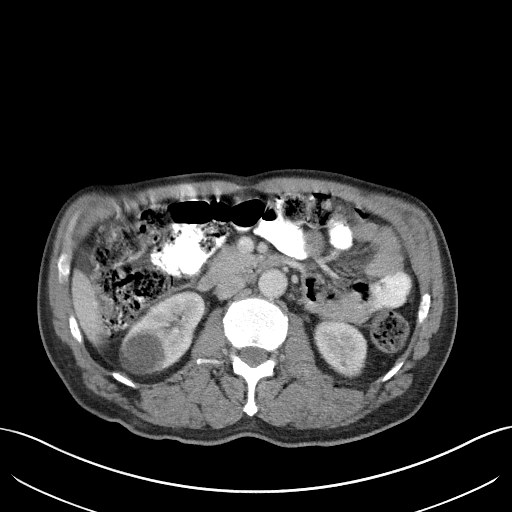
[im 79/132  mediastinal]
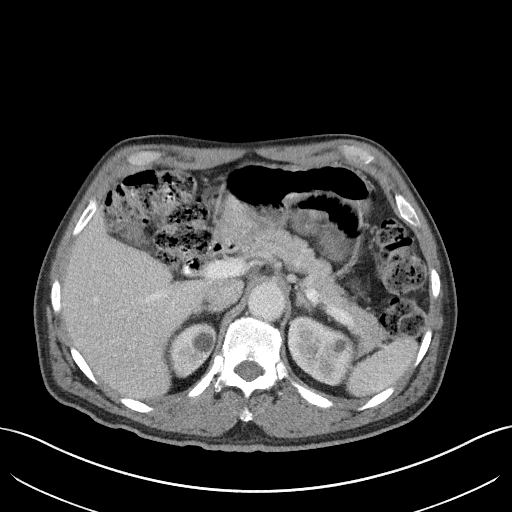
[im 92/132  mediastinal]
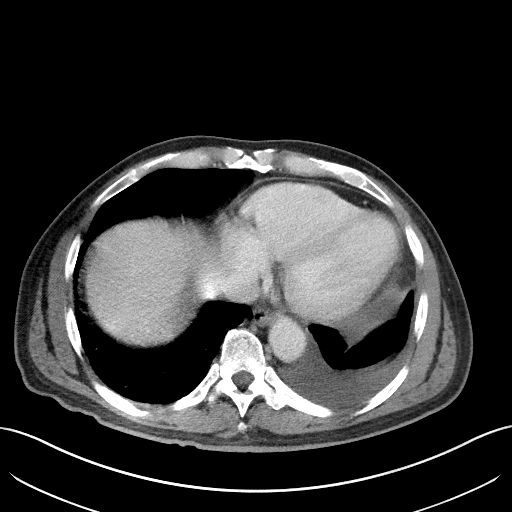
[im 105/132  mediastinal]
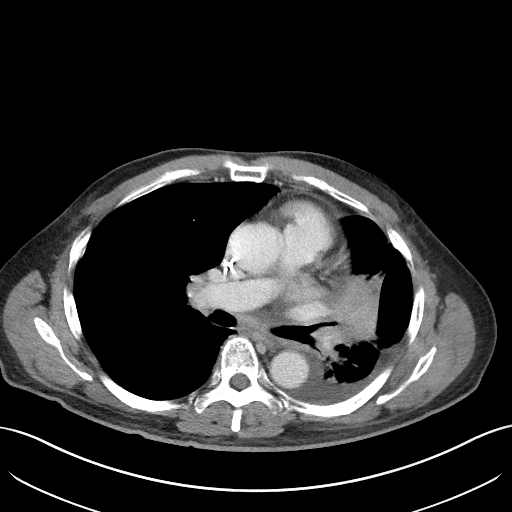
[im 118/132  mediastinal]
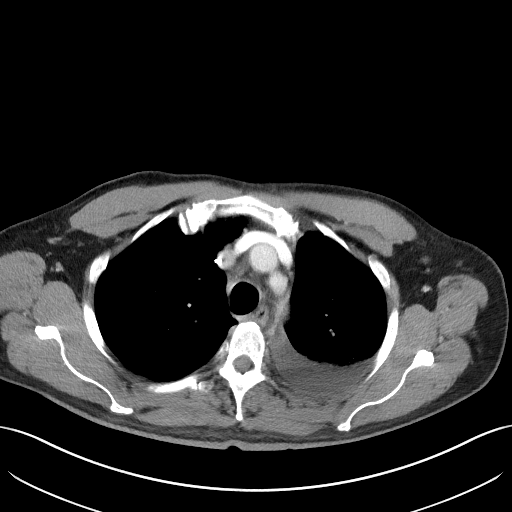
[im 118/132  bone]
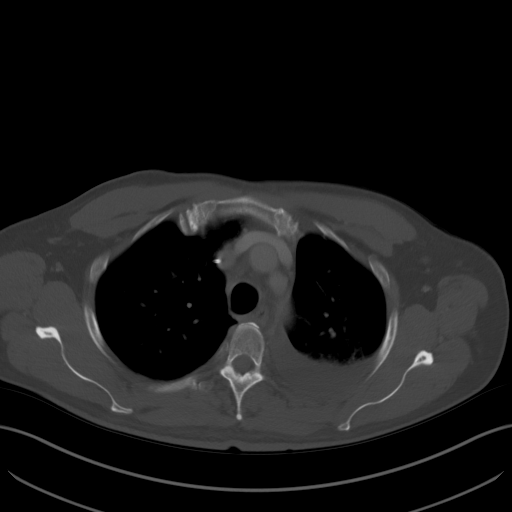

[Series 5: coronals · coronal · 0.85mm/px · 3 of 126 slices shown]
[im 26/126  mediastinal]
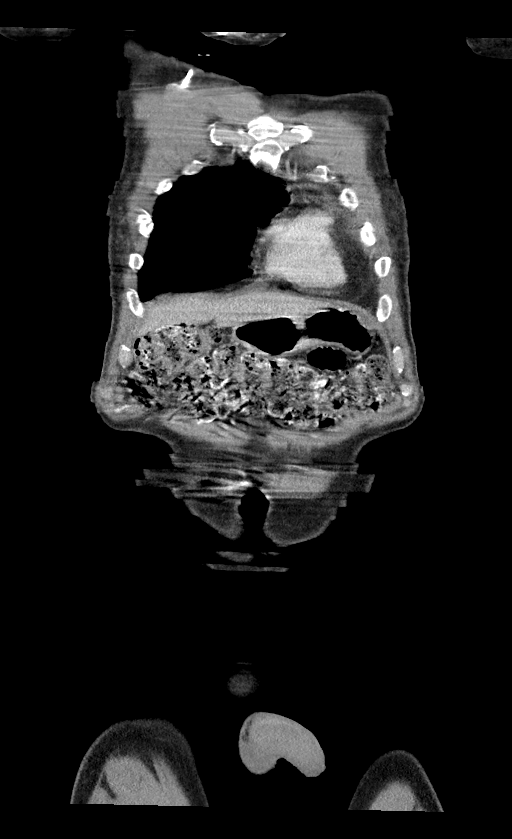
[im 51/126  mediastinal]
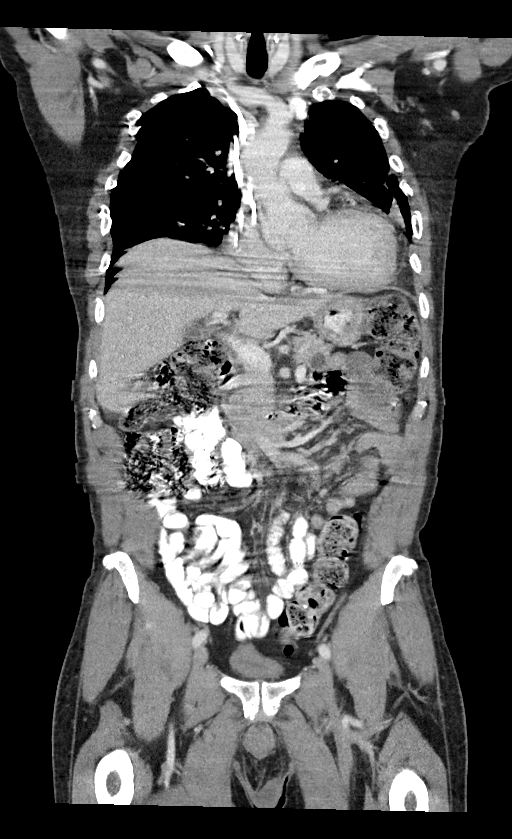
[im 76/126  mediastinal]
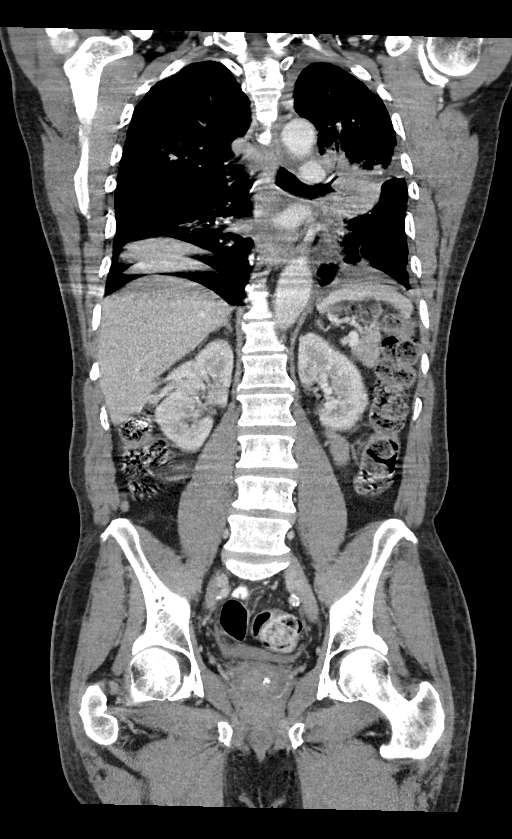

[12 of 36 positions shown; findings below may reference images not displayed]

RADIATION DOSE REDUCTION: This exam was performed according to the
departmental dose-optimization program which includes automated
exposure control, adjustment of the mA and/or kV according to
patient size and/or use of iterative reconstruction technique.

CONTRAST:  100mL OMNIPAQUE IOHEXOL 300 MG/ML  SOLN
FINDINGS: CT CHEST FINDINGS

Cardiovascular: The heart size is normal. No substantial pericardial
effusion. Right Port-A-Cath tip is positioned in the low right
atrium.

Mediastinum/Nodes: No mediastinal lymphadenopathy. Confluent soft
tissue attenuation in the left hilar region is similar to prior with
volume loss in the left hemithorax. No right hilar lymphadenopathy.
The esophagus has normal imaging features. There is no axillary
lymphadenopathy.

Lungs/Pleura: There is new confluent airspace opacity in the
posterior left upper lobe with similar appearance of the confluent
soft tissue density in the left hilum, lingula, and left lower lobe.
Small left pleural effusion is progressive in the interval. Fine
detail of parenchyma in the right lung is obscured by breathing
motion throughout image acquisition. Within this limitation, no
suspicious pulmonary nodule or mass is seen in the right lung.

Musculoskeletal: No worrisome lytic or sclerotic osseous
abnormality.

CT ABDOMEN PELVIS FINDINGS

Hepatobiliary: No suspicious focal abnormality within the liver
parenchyma. There is no evidence for gallstones, gallbladder wall
thickening, or pericholecystic fluid. No intrahepatic or
extrahepatic biliary dilation.

Pancreas: 14 mm cystic lesion in the tail of pancreas is stable in
the interval. No main duct dilatation.

Spleen: No splenomegaly. No focal mass lesion.

Adrenals/Urinary Tract: No adrenal nodule or mass. Stable bilateral
renal cysts. No evidence for hydroureter. Bladder is decompressed
which likely accentuates wall thickness.

Stomach/Bowel: Stomach is unremarkable. No gastric wall thickening.
No evidence of outlet obstruction. Duodenum is normally positioned
as is the ligament of Treitz. No small bowel wall thickening. No
small bowel dilatation. Prominent motion artifact in the abdomen
obscures much of the abdominal colon although a large stool volume
is evident. Neither the terminal ileum nor the appendix can be
discretely identified due to motion obscuration.

Vascular/Lymphatic: There is mild atherosclerotic calcification of
the abdominal aorta without aneurysm. There is no gastrohepatic or
hepatoduodenal ligament lymphadenopathy. No retroperitoneal or
mesenteric lymphadenopathy. No pelvic sidewall lymphadenopathy.

Reproductive: Prostate gland is enlarged. Hypoattenuating focus
towards the central region of the prostate may reflect a TURP defect
although this may represent cystic change. Finding is stable since
prior study and comparing back to 12/17/2019.

Other: Tiny amount of free fluid is seen in the pelvis.

Musculoskeletal: No worrisome lytic or sclerotic osseous
abnormality.
IMPRESSION: 1. New confluent airspace opacity in the posterior left upper lobe
with similar appearance of the confluent soft tissue density in the
left hilum, lingula, and left lower lobe. While some of this changes
likely related to treatment, given the increasing left pleural
effusion, recurrent disease is now a growing concern.
2. Small left pleural effusion, progressive in the interval.
3. Stable 14 mm cystic lesion in the tail of pancreas. Continued
attention on follow-up recommended.
4. Large stool volume. Imaging features compatible with clinical
constipation.
5. Prostatomegaly with probable TURP defect.
6. Aortic Atherosclerosis ([id number]).

## 2021-04-09 MED ORDER — IOHEXOL 300 MG/ML  SOLN
100.0000 mL | Freq: Once | INTRAMUSCULAR | Status: AC | PRN
  Administered 2021-04-09: 100 mL via INTRAVENOUS

## 2021-04-09 MED ORDER — TECHNETIUM TC 99M MEDRONATE IV KIT
20.0000 | PACK | Freq: Once | INTRAVENOUS | Status: AC | PRN
  Administered 2021-04-09: 21.1 via INTRAVENOUS

## 2021-04-14 ENCOUNTER — Encounter: Payer: Self-pay | Admitting: Oncology

## 2021-04-15 ENCOUNTER — Encounter: Payer: Self-pay | Admitting: Oncology

## 2021-04-16 ENCOUNTER — Inpatient Hospital Stay: Payer: Medicare Other

## 2021-04-16 ENCOUNTER — Other Ambulatory Visit: Payer: Self-pay

## 2021-04-16 ENCOUNTER — Encounter: Payer: Self-pay | Admitting: Oncology

## 2021-04-16 ENCOUNTER — Inpatient Hospital Stay (HOSPITAL_BASED_OUTPATIENT_CLINIC_OR_DEPARTMENT_OTHER): Payer: Medicare Other | Admitting: Oncology

## 2021-04-16 ENCOUNTER — Inpatient Hospital Stay: Payer: Medicare Other | Attending: Oncology

## 2021-04-16 VITALS — BP 123/75 | HR 98 | Temp 96.5°F | Resp 20 | Wt 165.9 lb

## 2021-04-16 DIAGNOSIS — Z5111 Encounter for antineoplastic chemotherapy: Secondary | ICD-10-CM | POA: Insufficient documentation

## 2021-04-16 DIAGNOSIS — C3432 Malignant neoplasm of lower lobe, left bronchus or lung: Secondary | ICD-10-CM

## 2021-04-16 DIAGNOSIS — E538 Deficiency of other specified B group vitamins: Secondary | ICD-10-CM | POA: Diagnosis not present

## 2021-04-16 LAB — CBC WITH DIFFERENTIAL/PLATELET
Abs Immature Granulocytes: 0.02 10*3/uL (ref 0.00–0.07)
Basophils Absolute: 0 10*3/uL (ref 0.0–0.1)
Basophils Relative: 0 %
Eosinophils Absolute: 0 10*3/uL (ref 0.0–0.5)
Eosinophils Relative: 1 %
HCT: 30.8 % — ABNORMAL LOW (ref 39.0–52.0)
Hemoglobin: 9.4 g/dL — ABNORMAL LOW (ref 13.0–17.0)
Immature Granulocytes: 0 %
Lymphocytes Relative: 15 %
Lymphs Abs: 0.8 10*3/uL (ref 0.7–4.0)
MCH: 27.3 pg (ref 26.0–34.0)
MCHC: 30.5 g/dL (ref 30.0–36.0)
MCV: 89.5 fL (ref 80.0–100.0)
Monocytes Absolute: 0.9 10*3/uL (ref 0.1–1.0)
Monocytes Relative: 17 %
Neutro Abs: 3.5 10*3/uL (ref 1.7–7.7)
Neutrophils Relative %: 67 %
Platelets: 383 10*3/uL (ref 150–400)
RBC: 3.44 MIL/uL — ABNORMAL LOW (ref 4.22–5.81)
RDW: 19.9 % — ABNORMAL HIGH (ref 11.5–15.5)
WBC: 5.2 10*3/uL (ref 4.0–10.5)
nRBC: 0 % (ref 0.0–0.2)

## 2021-04-16 LAB — COMPREHENSIVE METABOLIC PANEL
ALT: 10 U/L (ref 0–44)
AST: 23 U/L (ref 15–41)
Albumin: 3.2 g/dL — ABNORMAL LOW (ref 3.5–5.0)
Alkaline Phosphatase: 54 U/L (ref 38–126)
Anion gap: 6 (ref 5–15)
BUN: 14 mg/dL (ref 8–23)
CO2: 24 mmol/L (ref 22–32)
Calcium: 8.7 mg/dL — ABNORMAL LOW (ref 8.9–10.3)
Chloride: 108 mmol/L (ref 98–111)
Creatinine, Ser: 0.85 mg/dL (ref 0.61–1.24)
GFR, Estimated: >60 mL/min (ref >60)
Glucose, Bld: 94 mg/dL (ref 70–99)
Potassium: 3.5 mmol/L (ref 3.5–5.1)
Sodium: 138 mmol/L (ref 135–145)
Total Bilirubin: 0.4 mg/dL (ref 0.3–1.2)
Total Protein: 7.4 g/dL (ref 6.5–8.1)

## 2021-04-16 MED ORDER — SODIUM CHLORIDE 0.9 % IV SOLN
10.0000 mg | Freq: Once | INTRAVENOUS | Status: AC
  Administered 2021-04-16: 10 mg via INTRAVENOUS
  Filled 2021-04-16: qty 10

## 2021-04-16 MED ORDER — CYANOCOBALAMIN 1000 MCG/ML IJ SOLN
1000.0000 ug | INTRAMUSCULAR | Status: DC
  Administered 2021-04-16: 1000 ug via INTRAMUSCULAR
  Filled 2021-04-16: qty 1

## 2021-04-16 MED ORDER — HEPARIN SOD (PORK) LOCK FLUSH 100 UNIT/ML IV SOLN
500.0000 [IU] | Freq: Once | INTRAVENOUS | Status: AC | PRN
  Administered 2021-04-16: 500 [IU]
  Filled 2021-04-16: qty 5

## 2021-04-16 MED ORDER — SODIUM CHLORIDE 0.9 % IV SOLN
500.0000 mg/m2 | Freq: Once | INTRAVENOUS | Status: AC
  Administered 2021-04-16: 1000 mg via INTRAVENOUS
  Filled 2021-04-16: qty 40

## 2021-04-16 MED ORDER — SODIUM CHLORIDE 0.9 % IV SOLN
Freq: Once | INTRAVENOUS | Status: AC
  Filled 2021-04-16: qty 250

## 2021-04-16 NOTE — Progress Notes (Signed)
Patient states he had 3 teeth pulled and 5 teeth filled on 3/2.  ?

## 2021-04-16 NOTE — Progress Notes (Signed)
Nutrition Follow-up: ? ?Patient with lung cancer receiving alimta. ? ?Met with patient during infusion.  Patient reports that appetite is not as big as it once was.  Says that he eats some then feels so full and has to stop then go back and eat more.  Denies constipation.  Has issues with reflux.  Says that he is drinking 3 shakes per day.   ? ? ? ?Medications: reviewed ? ?Labs: reviewed ? ?Anthropometrics:  ? ?Weight 165 lb 14.4 oz today ?168 lb on 2/10 ?164 lb 1.6 oz on 1/20 ?163 lb on 12/9 ?164 lb on 10/28 ? ? ?NUTRITION DIAGNOSIS: Inadequate oral intake stable ? ? ? ?INTERVENTION:  ?Continue drinking 3 oral nutrition supplements daily ?Encouraged patient to continue to eat small frequent meals ?May benefit from trial of appetite stimulant if weight continues to decline ?  ? ?MONITORING, EVALUATION, GOAL: weight trends, intake ? ? ?NEXT VISIT: as needed ? ?Dori Devino B. Zenia Resides, RD, LDN ?Registered Dietitian ?[phone] (mobile) ? ? ?

## 2021-04-16 NOTE — Patient Instructions (Signed)
[facility] AT [place]  Discharge Instructions: Thank you for choosing [facility] to provide your oncology and hematology care.  If you have a lab appointment with the [facility], please go directly to the [facility] and check in at the registration area.  Wear comfortable clothing and clothing appropriate for easy access to any Portacath or PICC line.   We strive to give you quality time with your provider. You may need to reschedule your appointment if you arrive late (15 or more minutes).  Arriving late affects you and other patients whose appointments are after yours.  Also, if you miss three or more appointments without notifying the office, you may be dismissed from the clinic at the providers discretion.      For prescription refill requests, have your pharmacy contact our office and allow 72 hours for refills to be completed.    Today you received the following chemotherapy and/or immunotherapy agents: Alimta      To help prevent nausea and vomiting after your treatment, we encourage you to take your nausea medication as directed.  BELOW ARE SYMPTOMS THAT SHOULD BE REPORTED IMMEDIATELY: *FEVER GREATER THAN 100.4 F (38 C) OR HIGHER *CHILLS OR SWEATING *NAUSEA AND VOMITING THAT IS NOT CONTROLLED WITH YOUR NAUSEA MEDICATION *UNUSUAL SHORTNESS OF BREATH *UNUSUAL BRUISING OR BLEEDING *URINARY PROBLEMS (pain or burning when urinating, or frequent urination) *BOWEL PROBLEMS (unusual diarrhea, constipation, pain near the anus) TENDERNESS IN MOUTH AND THROAT WITH OR WITHOUT PRESENCE OF ULCERS (sore throat, sores in mouth, or a toothache) UNUSUAL RASH, SWELLING OR PAIN  UNUSUAL VAGINAL DISCHARGE OR ITCHING   Items with * indicate a potential emergency and should be followed up as soon as possible or go to the Emergency Department if any problems should occur.  Please show the CHEMOTHERAPY ALERT CARD or IMMUNOTHERAPY ALERT CARD at check-in to the  Emergency Department and triage nurse.  Should you have questions after your visit or need to cancel or reschedule your appointment, please contact [facility] [place] AT [place]  [phone] and follow the prompts.  Office hours are 8:00 a.m. to 4:30 p.m. Monday - Friday. Please note that voicemails left after 4:00 p.m. may not be returned until the following business day.  We are closed weekends and major holidays. You have access to a nurse at all times for urgent questions. Please call the main number to the clinic [phone] and follow the prompts.  For any non-urgent questions, you may also contact your provider using MyChart. We now offer e-Visits for anyone 70 and older to request care online for non-urgent symptoms. For details visit [email or web address].   Also download the MyChart app! Go to the app store, search "MyChart", open the app, select [facility], and log in with your MyChart username and password.  Due to Covid, a mask is required upon entering the hospital/clinic. If you do not have a mask, one will be given to you upon arrival. For doctor visits, patients may have 1 support person aged 5 or older with them. For treatment visits, patients cannot have anyone with them due to current Covid guidelines and our immunocompromised population. Pemetrexed injection What is this medication? PEMETREXED (PEM e TREX ed) is a chemotherapy drug used to treat lung cancers like non-small cell lung cancer and mesothelioma. It may also be used to treat other cancers. This medicine may be used for other purposes; ask your health care provider or pharmacist if you have questions. COMMON BRAND  NAME(S): Alimta, PEMFEXY What should I tell my care team before I take this medication? They need to know if you have any of these conditions: infection (especially a virus infection such as chickenpox, cold sores, or herpes) kidney disease low blood counts, like low white cell, platelet,  or red cell counts lung or breathing disease, like asthma radiation therapy an unusual or allergic reaction to pemetrexed, other medicines, foods, dyes, or preservative pregnant or trying to get pregnant breast-feeding How should I use this medication? This drug is given as an infusion into a vein. It is administered in a hospital or clinic by a specially trained health care professional. Talk to your pediatrician regarding the use of this medicine in children. Special care may be needed. Overdosage: If you think you have taken too much of this medicine contact a poison control center or emergency room at once. NOTE: This medicine is only for you. Do not share this medicine with others. What if I miss a dose? It is important not to miss your dose. Call your doctor or health care professional if you are unable to keep an appointment. What may interact with this medication? This medicine may interact with the following medications: Ibuprofen This list may not describe all possible interactions. Give your health care provider a list of all the medicines, herbs, non-prescription drugs, or dietary supplements you use. Also tell them if you smoke, drink alcohol, or use illegal drugs. Some items may interact with your medicine. What should I watch for while using this medication? Visit your doctor for checks on your progress. This drug may make you feel generally unwell. This is not uncommon, as chemotherapy can affect healthy cells as well as cancer cells. Report any side effects. Continue your course of treatment even though you feel ill unless your doctor tells you to stop. In some cases, you may be given additional medicines to help with side effects. Follow all directions for their use. Call your doctor or health care professional for advice if you get a fever, chills or sore throat, or other symptoms of a cold or flu. Do not treat yourself. This drug decreases your body's ability to fight  infections. Try to avoid being around people who are sick. This medicine may increase your risk to bruise or bleed. Call your doctor or health care professional if you notice any unusual bleeding. Be careful brushing and flossing your teeth or using a toothpick because you may get an infection or bleed more easily. If you have any dental work done, tell your dentist you are receiving this medicine. Avoid taking products that contain aspirin, acetaminophen, ibuprofen, naproxen, or ketoprofen unless instructed by your doctor. These medicines may hide a fever. Call your doctor or health care professional if you get diarrhea or mouth sores. Do not treat yourself. To protect your kidneys, drink water or other fluids as directed while you are taking this medicine. Do not become pregnant while taking this medicine or for 6 months after stopping it. Women should inform their doctor if they wish to become pregnant or think they might be pregnant. Men should not father a child while taking this medicine and for 3 months after stopping it. This may interfere with the ability to father a child. You should talk to your doctor or health care professional if you are concerned about your fertility. There is a potential for serious side effects to an unborn child. Talk to your health care professional or pharmacist for more information.  Do not breast-feed an infant while taking this medicine or for 1 week after stopping it. What side effects may I notice from receiving this medication? Side effects that you should report to your doctor or health care professional as soon as possible: allergic reactions like skin rash, itching or hives, swelling of the face, lips, or tongue breathing problems redness, blistering, peeling or loosening of the skin, including inside the mouth signs and symptoms of bleeding such as bloody or black, tarry stools; red or dark-brown urine; spitting up blood or brown material that looks like  coffee grounds; red spots on the skin; unusual bruising or bleeding from the eye, gums, or nose signs and symptoms of infection like fever or chills; cough; sore throat; pain or trouble passing urine signs and symptoms of kidney injury like trouble passing urine or change in the amount of urine signs and symptoms of liver injury like dark yellow or brown urine; general ill feeling or flu-like symptoms; light-colored stools; loss of appetite; nausea; right upper belly pain; unusually weak or tired; yellowing of the eyes or skin Side effects that usually do not require medical attention (report to your doctor or health care professional if they continue or are bothersome): constipation mouth sores nausea, vomiting unusually weak or tired This list may not describe all possible side effects. Call your doctor for medical advice about side effects. You may report side effects to FDA at 1-800-FDA-1088. Where should I keep my medication? This drug is given in a hospital or clinic and will not be stored at home. NOTE: This sheet is a summary. It may not cover all possible information. If you have questions about this medicine, talk to your doctor, pharmacist, or health care provider.  2022 [employer]vier/Gold Standard (2017-03-28 00:00:00)

## 2021-04-18 ENCOUNTER — Encounter: Payer: Self-pay | Admitting: Oncology

## 2021-04-18 NOTE — Progress Notes (Signed)
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  He underwent CT angios chest which did not show any PE.  Soft tissue attenuation measuring 2.9 x 4 cm centered in the left hilum resulting in abrupt angulation and narrowing of the pulmonary arteries and the central left lower lobe airways.  Additional ipsilateral hilar and subcarinal adenopathy.  No contralateral adenopathy.  Consolidative masslike opacity 3.6 x 3.1 cm in size and contiguous with more central perihilar soft tissue attenuation.  Overall findings concerning for primary bronchogenic carcinoma.    Patient lives with his sister who is his main caregiver.  He does not drive but is independent of his ADLs.  Reports ongoing fatigue and occasional retrosternal chest pain.  He has been evaluated by GI in the past for reflux as well.  Appetie is fair and weight is stable.  Denies any significant shortness of breath at this time.  Patient is an ex-smoker and smoked for about 20 years but quit smoking back in 1994   PET CT scan showed hypermetabolic mass in the left lower lobe with mediastinal adenopathy and right paratracheal adenopathy.  No evidence of distant metastatic disease.  Pathology was consistent with non-small cell lung cancer favor adenocarcinoma.  Cells were positive for TTF-1 and negative  for p40.   Patient started concurrent carbotaxol radiation and then had allergic reaction to Taxol for which she was switched to Abraxane.  Given the short supply of Abraxane patient received carbo Alimta midway through his concurrent chemoradiation.  Scans post chemoradiation showed partial response and patient will be started on maintenance durvalumab on 01/14/2020.  Patient was on maintenance durvalumab and received 15 cycles up until August 2022.  He was noted to have worsening chest pain and was in the ER multiple times in September 2022.  Repeat CT scan showed concern for hilar recurrence.  Patient received palliative radiation to the growing left upper lobe lung massWas given carbo Alimta for 4 cycles and presently on maintenance Alimta      Interval history-reports no worsening chest wall pain at this time.  Otherwise tolerating Alimta well other than occasional heartburn.  ECOG PS- 1 Pain scale- 2 Opioid associated constipation- no  Review of systems- Review of Systems  Constitutional:  Negative for chills, fever, malaise/fatigue and weight loss.  HENT:  Negative for congestion, ear discharge and nosebleeds.   Eyes:  Negative for blurred vision.  Respiratory:  Negative for cough, hemoptysis, sputum production, shortness of breath and wheezing.   Cardiovascular:  Negative for chest pain, palpitations, orthopnea and claudication.  Gastrointestinal:  Negative for abdominal pain, blood in stool, constipation, diarrhea, heartburn, melena, nausea and vomiting.  Genitourinary:  Negative for dysuria, flank pain, frequency, hematuria and urgency.  Musculoskeletal:  Negative for back pain, joint pain and myalgias.  Skin:  Negative for rash.  Neurological:  Negative for dizziness, tingling, focal weakness, seizures, weakness and  headaches.  Endo/Heme/Allergies:  Does not bruise/bleed easily.  Psychiatric/Behavioral:  Negative for depression and suicidal ideas. The patient does not have  insomnia.       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 ([place])      Past Surgical History:  Procedure Laterality Date   ESOPHAGOGASTRODUODENOSCOPY (EGD) WITH PROPOFOL N/A 12/01/2020   Procedure: ESOPHAGOGASTRODUODENOSCOPY (EGD) WITH PROPOFOL;  Surgeon: Lin Landsman, MD;  Location: [facility] ENDOSCOPY;  Service: Gastroenterology;  Laterality: N/A;   PORTA CATH INSERTION N/A 10/22/2019   Procedure: PORTA CATH INSERTION;  Surgeon: Katha Cabal, MD;  Location: [facility];  Service: Cardiovascular;  Laterality: N/A;   VIDEO BRONCHOSCOPY WITH ENDOBRONCHIAL ULTRASOUND N/A 09/25/2019   Procedure: VIDEO BRONCHOSCOPY WITH ENDOBRONCHIAL ULTRASOUND;  Surgeon: Tyler Pita, MD;  Location: [facility] ORS;  Service: Pulmonary;  Laterality: N/A;    Social History   Socioeconomic History   Marital status: Single    Spouse name: Not on file   Number of children: Not on file   Years of education: Not on file   Highest education level: Not on file  Occupational History   Not on file  Tobacco Use   Smoking status: Former    Packs/day: 1.00    Years: 20.00    Pack years: 20.00    Types: Cigarettes    Quit date: 50    Years since quitting: 29.1   Smokeless tobacco: Never  Vaping Use   Vaping Use: Never used  Substance and Sexual Activity   Alcohol use: Not Currently    Comment: not drank any beer in 2 onths   Drug use: Never   Sexual activity: Not Currently  Other Topics Concern   Not on file  Social History Narrative   Not on file   Social Determinants of Health   Financial Resource Strain: Not on file  Food Insecurity: Not on file  Transportation Needs: Not on file  Physical Activity: Not on file  Stress: Not on file  Social Connections:  Not on file  Intimate Partner Violence: Not on file    Family History  Problem Relation Age of Onset   Cancer Brother      Current Outpatient Medications:    acetaminophen (TYLENOL) 500 MG tablet, Take 1,000 mg by mouth every 6 (six) hours as needed for mild pain, moderate pain or headache., Disp: , Rfl:    albuterol (VENTOLIN HFA) 108 (90 Base) MCG/ACT inhaler, Inhale 2 puffs into the lungs every 4 (four) hours as needed for wheezing or shortness of breath., Disp: 8 g, Rfl: 1   ANORO ELLIPTA 62.5-25 MCG/ACT AEPB, Inhale 1 puff into the lungs daily., Disp: , Rfl:    Budeson-Glycopyrrol-Formoterol (BREZTRI AEROSPHERE) 160-9-4.8 MCG/ACT AERO, Inhale 2 puffs into the lungs in the morning and at bedtime., Disp: 10.7 g, Rfl: 5   clobetasol cream (TEMOVATE) 0.05 %, Apply topically., Disp: , Rfl:    DULoxetine (CYMBALTA) 30 MG capsule, Take 1 capsule (30 mg total) by mouth daily., Disp: 30 capsule, Rfl: 3   DULoxetine (CYMBALTA) 60 MG capsule, Take 60 mg by mouth daily., Disp: , Rfl:    folic acid (FOLVITE) 1 MG tablet, Take 1 tablet (1 mg total) by mouth  daily., Disp: 30 tablet, Rfl: 6   hydrOXYzine (ATARAX/VISTARIL) 25 MG tablet, Take 25 mg by mouth daily., Disp: , Rfl:    lidocaine (LIDODERM) 5 %, Place 1 patch onto the skin daily. Remove & Discard patch within 12 hours or as directed by MD, Disp: 30 patch, Rfl: 0   lidocaine (XYLOCAINE) 2 % solution, , Disp: , Rfl:    lidocaine-prilocaine (EMLA) cream, Apply 1 application topically as needed., Disp: 30 g, Rfl: 3   LORazepam (ATIVAN) 0.5 MG tablet, Take 1 tablet (0.5 mg total) by mouth every 8 (eight) hours as needed for anxiety., Disp: 60 tablet, Rfl: 0   morphine (MS CONTIN) 30 MG 12 hr tablet, Take 1 tablet (30 mg total) by mouth every 8 (eight) hours., Disp: 90 tablet, Rfl: 0   naloxegol oxalate (MOVANTIK) 25 MG TABS tablet, Take 1 tablet (25 mg total) by mouth daily., Disp: 30 tablet, Rfl: 2   naloxone (NARCAN) nasal spray 4 mg/0.1 mL,  SPRAY 1 SPRAY INTO ONE NOSTRIL AS DIRECTED FOR OPIOID OVERDOSE (TURN PERSON ON SIDE AFTER DOSE. IF NO RESPONSE IN 2-3 MINUTES OR PERSON RESPONDS BUT RELAPSES, REPEAT USING A NEW SPRAY DEVICE AND SPRAY INTO THE OTHER NOSTRIL. CALL 911 AFTER USE.) * EMERGENCY USE ONLY *, Disp: 1 each, Rfl: 0   nystatin (MYCOSTATIN) 100000 UNIT/ML suspension, Take by mouth., Disp: , Rfl:    Oxycodone HCl 20 MG TABS, Take 1 tablet (20 mg total) by mouth every 3 (three) hours as needed (for breakthrough pain)., Disp: 120 tablet, Rfl: 0   pantoprazole (PROTONIX) 40 MG tablet, Take 1 tablet (40mg ) twice daily for two weeks and then take 1 tablet daily thereafter, Disp: 60 tablet, Rfl: 3   potassium chloride SA (KLOR-CON) 20 MEQ tablet, TAKE 1 TABLET BY MOUTH TWICE DAILY, Disp: 30 tablet, Rfl: 1   sucralfate (CARAFATE) 1 g tablet, Take 1 tablet (1 g total) by mouth 4 (four) times daily., Disp: 60 tablet, Rfl: 0  Physical exam:  Vitals:   04/16/21 1349  BP: 123/75  Pulse: 98  Resp: 20  Temp: (!) 96.5 F (35.8 C)  SpO2: 99%  Weight: 165 lb 14.4 oz (75.3 kg)   Physical Exam Cardiovascular:     Rate and Rhythm: Normal rate and regular rhythm.     Heart sounds: Normal heart sounds.  Pulmonary:     Effort: Pulmonary effort is normal.     Breath sounds: Normal breath sounds.  Abdominal:     General: Bowel sounds are normal.     Palpations: Abdomen is soft.  Skin:    General: Skin is warm and dry.  Neurological:     Mental Status: He is alert and oriented to person, place, and time.     CMP Latest Ref Rng & Units 04/16/2021  Glucose 70 - 99 mg/dL 94  BUN 8 - 23 mg/dL 14  Creatinine 0.61 - 1.24 mg/dL 0.85  Sodium 135 - 145 mmol/L 138  Potassium 3.5 - 5.1 mmol/L 3.5  Chloride 98 - 111 mmol/L 108  CO2 22 - 32 mmol/L 24  Calcium 8.9 - 10.3 mg/dL 8.7(L)  Total Protein 6.5 - 8.1 g/dL 7.4  Total Bilirubin 0.3 - 1.2 mg/dL 0.4  Alkaline Phos 38 - 126 U/L 54  AST 15 - 41 U/L 23  ALT 0 - 44 U/L 10   CBC Latest  Ref Rng & Units 04/16/2021  WBC 4.0 - 10.5 K/uL 5.2  Hemoglobin 13.0 - 17.0 g/dL 9.4(L)  Hematocrit  39.0 - 52.0 % 30.8(L)  Platelets 150 - 400 K/uL 383    No images are attached to the encounter.  NM Bone Scan Whole Body  Result Date: 04/12/2021 CLINICAL DATA:  Lung cancer, restaging EXAM: NUCLEAR MEDICINE WHOLE BODY BONE SCAN TECHNIQUE: Whole body anterior and posterior images were obtained approximately 3 hours after intravenous injection of radiopharmaceutical. Patient was claustrophobic and unable to undergo whole-body imaging. Spot views of the entire skeleton were obtained. RADIOPHARMACEUTICALS:  21.1 mCi Technetium-39m MDP IV COMPARISON:  None Correlation: CT chest abdomen pelvis 04/09/2021, PET-CT 12/08/2020 FINDINGS: Uptake at the shoulders, sternoclavicular joints, knees, feet, typically degenerative. No worrisome sites of abnormal tracer uptake are seen to suggest osseous metastatic disease. Expected urinary tract and soft tissue distribution of tracer. IMPRESSION: No scintigraphic evidence of osseous metastatic disease. Electronically Signed   By: Lavonia Dana M.D.   On: 04/12/2021 17:54   CT CHEST ABDOMEN PELVIS W CONTRAST  Result Date: 04/12/2021 CLINICAL DATA:  Lung cancer restaging. EXAM: CT CHEST, ABDOMEN, AND PELVIS WITH CONTRAST TECHNIQUE: Multidetector CT imaging of the chest, abdomen and pelvis was performed following the standard protocol during bolus administration of intravenous contrast. RADIATION DOSE REDUCTION: This exam was performed according to the departmental dose-optimization program which includes automated exposure control, adjustment of the mA and/or kV according to patient size and/or use of iterative reconstruction technique. CONTRAST:  135mL OMNIPAQUE IOHEXOL 300 MG/ML  SOLN COMPARISON:  01/29/2021 FINDINGS: CT CHEST FINDINGS Cardiovascular: The heart size is normal. No substantial pericardial effusion. Right Port-A-Cath tip is positioned in the low right atrium.  Mediastinum/Nodes: No mediastinal lymphadenopathy. Confluent soft tissue attenuation in the left hilar region is similar to prior with volume loss in the left hemithorax. No right hilar lymphadenopathy. The esophagus has normal imaging features. There is no axillary lymphadenopathy. Lungs/Pleura: There is new confluent airspace opacity in the posterior left upper lobe with similar appearance of the confluent soft tissue density in the left hilum, lingula, and left lower lobe. Small left pleural effusion is progressive in the interval. Fine detail of parenchyma in the right lung is obscured by breathing motion throughout image acquisition. Within this limitation, no suspicious pulmonary nodule or mass is seen in the right lung. Musculoskeletal: No worrisome lytic or sclerotic osseous abnormality. CT ABDOMEN PELVIS FINDINGS Hepatobiliary: No suspicious focal abnormality within the liver parenchyma. There is no evidence for gallstones, gallbladder wall thickening, or pericholecystic fluid. No intrahepatic or extrahepatic biliary dilation. Pancreas: 14 mm cystic lesion in the tail of pancreas is stable in the interval. No main duct dilatation. Spleen: No splenomegaly. No focal mass lesion. Adrenals/Urinary Tract: No adrenal nodule or mass. Stable bilateral renal cysts. No evidence for hydroureter. Bladder is decompressed which likely accentuates wall thickness. Stomach/Bowel: Stomach is unremarkable. No gastric wall thickening. No evidence of outlet obstruction. Duodenum is normally positioned as is the ligament of Treitz. No small bowel wall thickening. No small bowel dilatation. Prominent motion artifact in the abdomen obscures much of the abdominal colon although a large stool volume is evident. Neither the terminal ileum nor the appendix can be discretely identified due to motion obscuration. Vascular/Lymphatic: There is mild atherosclerotic calcification of the abdominal aorta without aneurysm. There is no  gastrohepatic or hepatoduodenal ligament lymphadenopathy. No retroperitoneal or mesenteric lymphadenopathy. No pelvic sidewall lymphadenopathy. Reproductive: Prostate gland is enlarged. Hypoattenuating focus towards the central region of the prostate may reflect a TURP defect although this may represent cystic change. Finding is stable since prior study and comparing back  to 12/17/2019. Other: Tiny amount of free fluid is seen in the pelvis. Musculoskeletal: No worrisome lytic or sclerotic osseous abnormality. IMPRESSION: 1. New confluent airspace opacity in the posterior left upper lobe with similar appearance of the confluent soft tissue density in the left hilum, lingula, and left lower lobe. While some of this changes likely related to treatment, given the increasing left pleural effusion, recurrent disease is now a growing concern. 2. Small left pleural effusion, progressive in the interval. 3. Stable 14 mm cystic lesion in the tail of pancreas. Continued attention on follow-up recommended. 4. Large stool volume. Imaging features compatible with clinical constipation. 5. Prostatomegaly with probable TURP defect. 6. Aortic Atherosclerosis (ICD10-I70.0). Electronically Signed   By: Misty Stanley M.D.   On: 04/12/2021 07:32     Assessment and plan- Patient is a 71 y.o. 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  He was found to have local hilar recurrence and is s/p 4 cycles of carbo Alimta chemotherapy.  He is here for on treatment assessment prior to cycle 3 of maintenance Alimta  I have reviewed CT chest abdomen and pelvis with contrast and bone scan images independently and discussed findings with the patient.  There is increasing airspace opacity noted in the left upper lobe and it is unclear if this is truly recurrent disease versus secondary to prior radiation treatment.  There is no other findings of recurrent or  progressive disease.  I am therefore proceeding with cycle 3 of maintenance Alimta today.  I will discuss his case at tumor board next week before considering changing treatment.  B12 injection today and I will see him back in 3 weeks for B12 and Alimta   Visit Diagnosis 1. Malignant neoplasm of lower lobe of left lung ([place])   2. Encounter for antineoplastic chemotherapy      Dr. Randa Evens, MD, MPH [facility] [phone] 04/18/2021 7:30 PM

## 2021-04-22 ENCOUNTER — Other Ambulatory Visit: Payer: Medicaid Other

## 2021-04-22 NOTE — Progress Notes (Signed)
Tumor Board Documentation ? ?[name] was presented by Dr Janese Banks at our Tumor Board on 04/22/2021, which included representatives from medical oncology, surgical, pathology, radiology, genetics, research, palliative care, internal medicine, navigation, radiation oncology, pulmonology. ? ?Mitchell Frye currently presents as a current patient, for [name] with history of the following treatments: neoadjuvant chemoradiation, active survellience. ? ?Additionally, we reviewed previous medical and familial history, history of present illness, and recent lab results along with all available histopathologic and imaging studies. The tumor board considered available treatment options and made the following recommendations: ?Active surveillance (repeat PET in 2 -3 months) ?Continue same treatment ? ?The following procedures/referrals were also placed: No orders of the defined types were placed in this encounter. ? ? ?Clinical Trial Status: not discussed  ? ?Staging used: Clinical Stage ?AJCC Staging: ?T: c2 ?N: c3 ?  ?Group: [name] B Lung Cancer of LLL ? ? ?National site-specific guidelines NCCN were discussed with respect to the case. ? ?Tumor board is a meeting of clinicians from various specialty areas who evaluate and discuss patients for whom a multidisciplinary approach is being considered. Final determinations in the plan of care are those of the provider(s). The responsibility for follow up of recommendations given during tumor board is that of the provider.  ? ?Today?s extended care, comprehensive team conference, Mitchell Frye was not present for the discussion and was not examined.  ? ?Multidisciplinary Tumor Board is a multidisciplinary case peer review process.  Decisions discussed in the Multidisciplinary Tumor Board reflect the opinions of the specialists present at the conference without having examined the patient.  Ultimately, treatment and diagnostic decisions rest with the primary provider(s) and the patient. ? ?

## 2021-04-26 ENCOUNTER — Telehealth: Payer: Self-pay | Admitting: *Deleted

## 2021-04-26 NOTE — Telephone Encounter (Signed)
Patient's sister called and would like to make some adjustments to his upcoming appointments. ?

## 2021-04-28 ENCOUNTER — Inpatient Hospital Stay: Payer: Medicare Other

## 2021-04-28 ENCOUNTER — Other Ambulatory Visit: Payer: Self-pay

## 2021-04-28 ENCOUNTER — Encounter: Payer: Self-pay | Admitting: Radiation Oncology

## 2021-04-28 ENCOUNTER — Ambulatory Visit
Admission: RE | Admit: 2021-04-28 | Discharge: 2021-04-28 | Disposition: A | Discharge status: Home / Self Care | Payer: Medicare Other | Source: Ambulatory Visit | Attending: Radiation Oncology | Admitting: Radiation Oncology

## 2021-04-28 ENCOUNTER — Encounter: Payer: Self-pay | Admitting: Oncology

## 2021-04-28 VITALS — BP 138/79 | HR 81 | Temp 96.9°F | Resp 18 | Ht 70.0 in | Wt 168.6 lb

## 2021-04-28 DIAGNOSIS — J910 Malignant pleural effusion: Secondary | ICD-10-CM | POA: Insufficient documentation

## 2021-04-28 DIAGNOSIS — C3432 Malignant neoplasm of lower lobe, left bronchus or lung: Secondary | ICD-10-CM | POA: Diagnosis present

## 2021-04-28 DIAGNOSIS — Z923 Personal history of irradiation: Secondary | ICD-10-CM | POA: Diagnosis not present

## 2021-04-28 NOTE — Progress Notes (Signed)
Radiation Oncology ?Follow up Note ? ?Name: Mitchell Frye   ?Date:   04/28/2021 ?MRN:  [id number] ?DOB: 16-Feb-1950  ? ? ?This 71 y.o. male presents to the clinic today for 2-month follow-up status post salvage radiation therapy to his chest in a patient with known stage IIIb non-small cell lung cancer of the left hilum previously treated almost 2-1/2 years prior. ? ?REFERRING PROVIDER: [place] ?HPI: Patient is a 71 year old male previously treated 2 and half years prior for stage IIIb non-small cell lung cancer left hilum with concurrent chemoradiation.  He had a recurrence in the left hilum we did salvage radiation therapy.  He is now seen out 3 months is doing well he is having no pain.  Has a mild nonproductive cough no hemoptysis or chest tightness he is currently on.  Maintenance Alimta.  He is tolerating it well.  Had a recent CT scan of his chest showing new confluent airspace opacity posterior left upper lobe consistent with confluent soft tissue density in the left hilum lingula and left lower lobe.  He does have a small left pleural effusion.  We reviewed his case at tumor conference.  Recommendation was to repeat a PET scan in approximately 3 months.  My interpretation was these are all changes since most likely related to radiation. ? ?COMPLICATIONS OF TREATMENT: none ? ?FOLLOW UP COMPLIANCE: keeps appointments  ? ?PHYSICAL EXAM:  ?BP 138/79   Pulse 81   Temp (!) 96.9 ?F (36.1 ?C)   Resp 18   Ht 5\' 10"  (1.778 m)   Wt 168 lb 9.6 oz (76.5 kg)   BMI 24.19 kg/m?  ?Well-developed well-nourished patient in NAD. HEENT reveals PERLA, EOMI, discs not visualized.  Oral cavity is clear. No oral mucosal lesions are identified. Neck is clear without evidence of cervical or supraclavicular adenopathy. Lungs are clear to A&P. Cardiac examination is essentially unremarkable with regular rate and rhythm without murmur rub or thrill. Abdomen is benign with no organomegaly or masses noted. Motor  sensory and DTR levels are equal and symmetric in the upper and lower extremities. Cranial nerves II through XII are grossly intact. Proprioception is intact. No peripheral adenopathy or edema is identified. No motor or sensory levels are noted. Crude visual fields are within normal range. ? ?RADIOLOGY RESULTS: Serial CT scans reviewed compatible with above-stated findings ? ?PLAN: Present time patient is doing well on maintenance Alimta.  We will review his his PET CT scan in 3 months.  I believe all these changes are most likely related to radiation both initial as well as salvage.  Case was presented at our tumor board with recommendations for repeat PET CT scan.  I will see him back in 6 months for follow-up.  Patient knows to call with any concerns. ? ?I would like to take this opportunity to thank you for allowing me to participate in the care of your patient.. ?  ? Noreene Filbert, MD ? ?

## 2021-05-03 ENCOUNTER — Encounter: Payer: Self-pay | Admitting: Oncology

## 2021-05-07 ENCOUNTER — Other Ambulatory Visit: Payer: Self-pay

## 2021-05-07 ENCOUNTER — Inpatient Hospital Stay: Payer: Medicare Other

## 2021-05-07 ENCOUNTER — Encounter: Payer: Self-pay | Admitting: Oncology

## 2021-05-07 ENCOUNTER — Inpatient Hospital Stay (HOSPITAL_BASED_OUTPATIENT_CLINIC_OR_DEPARTMENT_OTHER): Payer: Medicare Other | Admitting: Oncology

## 2021-05-07 VITALS — BP 118/79 | HR 95 | Temp 98.5°F | Resp 18 | Wt 169.4 lb

## 2021-05-07 DIAGNOSIS — C3432 Malignant neoplasm of lower lobe, left bronchus or lung: Secondary | ICD-10-CM | POA: Diagnosis not present

## 2021-05-07 DIAGNOSIS — Z5111 Encounter for antineoplastic chemotherapy: Secondary | ICD-10-CM | POA: Diagnosis not present

## 2021-05-07 LAB — CBC WITH DIFFERENTIAL/PLATELET
Abs Immature Granulocytes: 0.04 10*3/uL (ref 0.00–0.07)
Basophils Absolute: 0 10*3/uL (ref 0.0–0.1)
Basophils Relative: 0 %
Eosinophils Absolute: 0.1 10*3/uL (ref 0.0–0.5)
Eosinophils Relative: 1 %
HCT: 32.2 % — ABNORMAL LOW (ref 39.0–52.0)
Hemoglobin: 10 g/dL — ABNORMAL LOW (ref 13.0–17.0)
Immature Granulocytes: 1 %
Lymphocytes Relative: 18 %
Lymphs Abs: 1 10*3/uL (ref 0.7–4.0)
MCH: 27.4 pg (ref 26.0–34.0)
MCHC: 31.1 g/dL (ref 30.0–36.0)
MCV: 88.2 fL (ref 80.0–100.0)
Monocytes Absolute: 0.7 10*3/uL (ref 0.1–1.0)
Monocytes Relative: 13 %
Neutro Abs: 3.6 10*3/uL (ref 1.7–7.7)
Neutrophils Relative %: 67 %
Platelets: 386 10*3/uL (ref 150–400)
RBC: 3.65 MIL/uL — ABNORMAL LOW (ref 4.22–5.81)
RDW: 18.1 % — ABNORMAL HIGH (ref 11.5–15.5)
WBC: 5.3 10*3/uL (ref 4.0–10.5)
nRBC: 0 % (ref 0.0–0.2)

## 2021-05-07 LAB — COMPREHENSIVE METABOLIC PANEL
ALT: 8 U/L (ref 0–44)
AST: 22 U/L (ref 15–41)
Albumin: 3.3 g/dL — ABNORMAL LOW (ref 3.5–5.0)
Alkaline Phosphatase: 53 U/L (ref 38–126)
Anion gap: 6 (ref 5–15)
BUN: 15 mg/dL (ref 8–23)
CO2: 25 mmol/L (ref 22–32)
Calcium: 8.9 mg/dL (ref 8.9–10.3)
Chloride: 108 mmol/L (ref 98–111)
Creatinine, Ser: 0.89 mg/dL (ref 0.61–1.24)
GFR, Estimated: >60 mL/min (ref >60)
Glucose, Bld: 112 mg/dL — ABNORMAL HIGH (ref 70–99)
Potassium: 3.2 mmol/L — ABNORMAL LOW (ref 3.5–5.1)
Sodium: 139 mmol/L (ref 135–145)
Total Bilirubin: 0.5 mg/dL (ref 0.3–1.2)
Total Protein: 7.3 g/dL (ref 6.5–8.1)

## 2021-05-07 MED ORDER — SODIUM CHLORIDE 0.9 % IV SOLN
Freq: Once | INTRAVENOUS | Status: AC
  Filled 2021-05-07: qty 250

## 2021-05-07 MED ORDER — SODIUM CHLORIDE 0.9 % IV SOLN
500.0000 mg/m2 | Freq: Once | INTRAVENOUS | Status: AC
  Administered 2021-05-07: 1000 mg via INTRAVENOUS
  Filled 2021-05-07: qty 40

## 2021-05-07 MED ORDER — SODIUM CHLORIDE 0.9 % IV SOLN
10.0000 mg | Freq: Once | INTRAVENOUS | Status: AC
  Administered 2021-05-07: 10 mg via INTRAVENOUS
  Filled 2021-05-07: qty 10

## 2021-05-07 MED ORDER — HEPARIN SOD (PORK) LOCK FLUSH 100 UNIT/ML IV SOLN
500.0000 [IU] | Freq: Once | INTRAVENOUS | Status: AC | PRN
  Administered 2021-05-07: 500 [IU]
  Filled 2021-05-07: qty 5

## 2021-05-07 NOTE — Progress Notes (Signed)
Unclear if pt is taking Folic acid. Per MD ok to continue Alimta at this time. ?

## 2021-05-07 NOTE — Patient Instructions (Signed)
[facility] CANCER CTR AT [place]  Discharge Instructions: ?Thank you for choosing [facility] to provide your oncology and hematology care.  ?If you have a lab appointment with the [facility], please go directly to the [facility] and check in at the registration area. ? ?Wear comfortable clothing and clothing appropriate for easy access to any Portacath or PICC line.  ? ?We strive to give you quality time with your provider. You may need to reschedule your appointment if you arrive late (15 or more minutes).  Arriving late affects you and other patients whose appointments are after yours.  Also, if you miss three or more appointments without notifying the office, you may be dismissed from the clinic at the provider?s discretion.    ?  ?For prescription refill requests, have your pharmacy contact our office and allow 72 hours for refills to be completed.   ? ?Today you received the following chemotherapy and/or immunotherapy agents ALIMTA    ?  ?To help prevent nausea and vomiting after your treatment, we encourage you to take your nausea medication as directed. ? ?BELOW ARE SYMPTOMS THAT SHOULD BE REPORTED IMMEDIATELY: ?*FEVER GREATER THAN 100.4 F (38 ?C) OR HIGHER ?*CHILLS OR SWEATING ?*NAUSEA AND VOMITING THAT IS NOT CONTROLLED WITH YOUR NAUSEA MEDICATION ?*UNUSUAL SHORTNESS OF BREATH ?*UNUSUAL BRUISING OR BLEEDING ?*URINARY PROBLEMS (pain or burning when urinating, or frequent urination) ?*BOWEL PROBLEMS (unusual diarrhea, constipation, pain near the anus) ?TENDERNESS IN MOUTH AND THROAT WITH OR WITHOUT PRESENCE OF ULCERS (sore throat, sores in mouth, or a toothache) ?UNUSUAL RASH, SWELLING OR PAIN  ?UNUSUAL VAGINAL DISCHARGE OR ITCHING  ? ?Items with * indicate a potential emergency and should be followed up as soon as possible or go to the Emergency Department if any problems should occur. ? ?Please show the CHEMOTHERAPY ALERT CARD or IMMUNOTHERAPY ALERT CARD at check-in to the  Emergency Department and triage nurse. ? ?Should you have questions after your visit or need to cancel or reschedule your appointment, please contact [facility] CANCER [place] AT [place]  [phone] and follow the prompts.  Office hours are 8:00 a.m. to 4:30 p.m. Monday - Friday. Please note that voicemails left after 4:00 p.m. may not be returned until the following business day.  We are closed weekends and major holidays. You have access to a nurse at all times for urgent questions. Please call the main number to the clinic [phone] and follow the prompts. ? ?For any non-urgent questions, you may also contact your provider using MyChart. We now offer e-Visits for anyone 50 and older to request care online for non-urgent symptoms. For details visit [email or web address]. ?  ?Also download the MyChart app! Go to the app store, search "MyChart", open the app, select [facility], and log in with your MyChart username and password. ? ?Due to Covid, a mask is required upon entering the hospital/clinic. If you do not have a mask, one will be given to you upon arrival. For doctor visits, patients may have 1 support person aged 11 or older with them. For treatment visits, patients cannot have anyone with them due to current Covid guidelines and our immunocompromised population.  ? ?Pemetrexed injection ?What is this medication? ?PEMETREXED (PEM e TREX ed) is a chemotherapy drug used to treat lung cancers like non-small cell lung cancer and mesothelioma. It may also be used to treat other cancers. ?This medicine may be used for other purposes; ask your health care provider or pharmacist if you have questions. ?  COMMON BRAND NAME(S): Alimta, PEMFEXY ?What should I tell my care team before I take this medication? ?They need to know if you have any of these conditions: ?infection (especially a virus infection such as chickenpox, cold sores, or herpes) ?kidney disease ?low blood counts, like low white cell,  platelet, or red cell counts ?lung or breathing disease, like asthma ?radiation therapy ?an unusual or allergic reaction to pemetrexed, other medicines, foods, dyes, or preservative ?pregnant or trying to get pregnant ?breast-feeding ?How should I use this medication? ?This drug is given as an infusion into a vein. It is administered in a hospital or clinic by a specially trained health care professional. ?Talk to your pediatrician regarding the use of this medicine in children. Special care may be needed. ?Overdosage: If you think you have taken too much of this medicine contact a poison control center or emergency room at once. ?NOTE: This medicine is only for you. Do not share this medicine with others. ?What if I miss a dose? ?It is important not to miss your dose. Call your doctor or health care professional if you are unable to keep an appointment. ?What may interact with this medication? ?This medicine may interact with the following medications: ?Ibuprofen ?This list may not describe all possible interactions. Give your health care provider a list of all the medicines, herbs, non-prescription drugs, or dietary supplements you use. Also tell them if you smoke, drink alcohol, or use illegal drugs. Some items may interact with your medicine. ?What should I watch for while using this medication? ?Visit your doctor for checks on your progress. This drug may make you feel generally unwell. This is not uncommon, as chemotherapy can affect healthy cells as well as cancer cells. Report any side effects. Continue your course of treatment even though you feel ill unless your doctor tells you to stop. ?In some cases, you may be given additional medicines to help with side effects. Follow all directions for their use. ?Call your doctor or health care professional for advice if you get a fever, chills or sore throat, or other symptoms of a cold or flu. Do not treat yourself. This drug decreases your body's ability to  fight infections. Try to avoid being around people who are sick. ?This medicine may increase your risk to bruise or bleed. Call your doctor or health care professional if you notice any unusual bleeding. ?Be careful brushing and flossing your teeth or using a toothpick because you may get an infection or bleed more easily. If you have any dental work done, tell your dentist you are receiving this medicine. ?Avoid taking products that contain aspirin, acetaminophen, ibuprofen, naproxen, or ketoprofen unless instructed by your doctor. These medicines may hide a fever. ?Call your doctor or health care professional if you get diarrhea or mouth sores. Do not treat yourself. ?To protect your kidneys, drink water or other fluids as directed while you are taking this medicine. ?Do not become pregnant while taking this medicine or for 6 months after stopping it. Women should inform their doctor if they wish to become pregnant or think they might be pregnant. Men should not father a child while taking this medicine and for 3 months after stopping it. This may interfere with the ability to father a child. You should talk to your doctor or health care professional if you are concerned about your fertility. There is a potential for serious side effects to an unborn child. Talk to your health care professional or pharmacist for  more information. Do not breast-feed an infant while taking this medicine or for 1 week after stopping it. ?What side effects may I notice from receiving this medication? ?Side effects that you should report to your doctor or health care professional as soon as possible: ?allergic reactions like skin rash, itching or hives, swelling of the face, lips, or tongue ?breathing problems ?redness, blistering, peeling or loosening of the skin, including inside the mouth ?signs and symptoms of bleeding such as bloody or black, tarry stools; red or dark-brown urine; spitting up blood or brown material that looks  like coffee grounds; red spots on the skin; unusual bruising or bleeding from the eye, gums, or nose ?signs and symptoms of infection like fever or chills; cough; sore throat; pain or trouble passing urine ?signs

## 2021-05-09 ENCOUNTER — Encounter: Payer: Self-pay | Admitting: Oncology

## 2021-05-09 NOTE — Progress Notes (Signed)
? ? ? ?Hematology/Oncology Consult note ?[place]  ?[phone] [phone] ? ?Patient Care Team: ?Center, [facility] as PCP - General ?Telford Nab, RN as Oncology Nurse Navigator  ? ?Name of the patient: Mitchell Frye  ?[id number]  ?01/29/1951  ? ?Date of visit: 05/09/21 ? 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 ?Chief complaint/ Reason for visit-on treatment assessment prior to cycle 4 of maintenance Alimta ? ?Heme/Onc history: Patient is a 71 year old male with a past medical history significant for hypertension and hyperlipidemia who presented to the ER with symptoms of worsening cough and shortness of breath.  He underwent CT angios chest which did not show any PE.  Soft tissue attenuation measuring 2.9 x 4 cm centered in the left hilum resulting in abrupt angulation and narrowing of the pulmonary arteries and the central left lower lobe airways.  Additional ipsilateral hilar and subcarinal adenopathy.  No contralateral adenopathy.  Consolidative masslike opacity 3.6 x 3.1 cm in size and contiguous with more central perihilar soft tissue attenuation.  Overall findings concerning for primary bronchogenic carcinoma.    ?Patient lives with his sister who is his main caregiver.  He does not drive but is independent of his ADLs.  Reports ongoing fatigue and occasional retrosternal chest pain.  He has been evaluated by GI in the past for reflux as well.  Appetie is fair and weight is stable.  Denies any significant shortness of breath at this time.  Patient is an ex-smoker and smoked for about 20 years but quit smoking back in 1994 ?  ?PET CT scan showed hypermetabolic mass in the left lower lobe with mediastinal adenopathy and right paratracheal adenopathy.  No evidence of distant metastatic disease.  Pathology was consistent with non-small cell lung cancer favor adenocarcinoma.  Cells were positive for TTF-1 and negative for  p40. ?  ?Patient started concurrent carbotaxol radiation and then had allergic reaction to Taxol for which she was switched to Abraxane.  Given the short supply of Abraxane patient received carbo Alimta midway through his concurrent chemoradiation.  Scans post chemoradiation showed partial response and patient will be started on maintenance durvalumab on 01/14/2020.  Patient was on maintenance durvalumab and received 15 cycles up until August 2022.  He was noted to have worsening chest pain and was in the ER multiple times in September 2022.  Repeat CT scan showed concern for hilar recurrence.  Patient received palliative radiation to the growing left upper lobe lung massWas given carbo Alimta for 4 cycles and presently on maintenance Alimta ? ?Interval history-reports occasional heartburn.  Denies any chest pain.  Reports exertional shortness of breath.  He has baseline COPD and is taking his inhalers. ? ?ECOG PS- 1 ?Pain scale- 0 ? ? ?Review of systems- Review of Systems  ?Constitutional:  Positive for malaise/fatigue. Negative for chills, fever and weight loss.  ?HENT:  Negative for congestion, ear discharge and nosebleeds.   ?Eyes:  Negative for blurred vision.  ?Respiratory:  Positive for shortness of breath. Negative for cough, hemoptysis, sputum production and wheezing.   ?Cardiovascular:  Negative for chest pain, palpitations, orthopnea and claudication.  ?Gastrointestinal:  Negative for abdominal pain, blood in stool, constipation, diarrhea, heartburn, melena, nausea and vomiting.  ?Genitourinary:  Negative for dysuria, flank pain, frequency, hematuria and urgency.  ?Musculoskeletal:  Negative for back pain, joint pain and myalgias.  ?Skin:  Negative for rash.  ?Neurological:  Negative for dizziness, tingling, focal weakness, seizures, weakness  and headaches.  ?Endo/Heme/Allergies:  Does not bruise/bleed easily.  ?Psychiatric/Behavioral:  Negative for depression and suicidal ideas. The patient does not have  insomnia.    ? ? ?Allergies  ?Allergen Reactions  ? Taxol [Paclitaxel] Other (See Comments)  ?  Chest pain, hip pain, coughing , wheezing  ? ? ? ?Past Medical History:  ?Diagnosis Date  ? Dyspnea   ? Hyperlipidemia   ? Hypertension   ? Primary malignant neoplasm of left lower lobe of lung ([place])   ? ? ? ?Past Surgical History:  ?Procedure Laterality Date  ? ESOPHAGOGASTRODUODENOSCOPY (EGD) WITH PROPOFOL N/A 12/01/2020  ? Procedure: ESOPHAGOGASTRODUODENOSCOPY (EGD) WITH PROPOFOL;  Surgeon: Lin Landsman, MD;  Location[facility] ENDOSCOPY;  Service: Gastroenterology;  Laterality: N/A;  ? PORTA CATH INSERTION N/A 10/22/2019  ? Procedure: PORTA CATH INSERTION;  Surgeon: Katha Cabal, MD;  Location: [facility] CV LAB;  Service: Cardiovascular;  Laterality: N/A;  ? VIDEO BRONCHOSCOPY WITH ENDOBRONCHIAL ULTRASOUND N/A 09/25/2019  ? Procedure: VIDEO BRONCHOSCOPY WITH ENDOBRONCHIAL ULTRASOUND;  Surgeon: Tyler Pita, MD;  Location: [facility] ORS;  Service: Pulmonary;  Laterality: N/A;  ? ? ?Social History  ? ?Socioeconomic History  ? Marital status: Single  ?  Spouse name: Not on file  ? Number of children: Not on file  ? Years of education: Not on file  ? Highest education level: Not on file  ?Occupational History  ? Not on file  ?Tobacco Use  ? Smoking status: Former  ?  Packs/day: 1.00  ?  Years: 20.00  ?  Pack years: 20.00  ?  Types: Cigarettes  ?  Quit date: 68  ?  Years since quitting: 29.2  ? Smokeless tobacco: Never  ?Vaping Use  ? Vaping Use: Never used  ?Substance and Sexual Activity  ? Alcohol use: Not Currently  ?  Comment: not drank any beer in 2 onths  ? Drug use: Never  ? Sexual activity: Not Currently  ?Other Topics Concern  ? Not on file  ?Social History Narrative  ? Not on file  ? ?Social Determinants of Health  ? ?Financial Resource Strain: Not on file  ?Food Insecurity: Not on file  ?Transportation Needs: Not on file  ?Physical Activity: Not on file  ?Stress: Not on file  ?Social Connections:  Not on file  ?Intimate Partner Violence: Not on file  ? ? ?Family History  ?Problem Relation Age of Onset  ? Cancer Brother   ? ? ? ?Current Outpatient Medications:  ?  albuterol (VENTOLIN HFA) 108 (90 Base) MCG/ACT inhaler, Inhale 2 puffs into the lungs every 4 (four) hours as needed for wheezing or shortness of breath., Disp: 8 g, Rfl: 1 ?  ANORO ELLIPTA 62.5-25 MCG/ACT AEPB, Inhale 1 puff into the lungs daily., Disp: , Rfl:  ?  DULoxetine (CYMBALTA) 30 MG capsule, Take 1 capsule (30 mg total) by mouth daily., Disp: 30 capsule, Rfl: 3 ?  acetaminophen (TYLENOL) 500 MG tablet, Take 1,000 mg by mouth every 6 (six) hours as needed for mild pain, moderate pain or headache. (Patient not taking: Reported on 05/07/2021), Disp: , Rfl:  ?  Budeson-Glycopyrrol-Formoterol (BREZTRI AEROSPHERE) 160-9-4.8 MCG/ACT AERO, Inhale 2 puffs into the lungs in the morning and at bedtime. (Patient not taking: Reported on 05/07/2021), Disp: 10.7 g, Rfl: 5 ?  clobetasol cream (TEMOVATE) 0.05 %, Apply topically. (Patient not taking: Reported on 05/07/2021), Disp: , Rfl:  ?  DULoxetine (CYMBALTA) 60 MG capsule, Take 60 mg by mouth daily. (Patient not  taking: Reported on 05/07/2021), Disp: , Rfl:  ?  FLOMAX 0.4 MG CAPS capsule, Take 0.4 mg by mouth daily. (Patient not taking: Reported on 05/07/2021), Disp: , Rfl:  ?  FLOVENT HFA 220 MCG/ACT inhaler, Inhale 2 puffs into the lungs 2 (two) times daily. (Patient not taking: Reported on 05/07/2021), Disp: , Rfl:  ?  folic acid (FOLVITE) 1 MG tablet, Take 1 tablet (1 mg total) by mouth daily. (Patient not taking: Reported on 05/07/2021), Disp: 30 tablet, Rfl: 6 ?  hydrOXYzine (ATARAX/VISTARIL) 25 MG tablet, Take 25 mg by mouth daily. (Patient not taking: Reported on 05/07/2021), Disp: , Rfl:  ?  lidocaine (LIDODERM) 5 %, Place 1 patch onto the skin daily. Remove & Discard patch within 12 hours or as directed by MD (Patient not taking: Reported on 05/07/2021), Disp: 30 patch, Rfl: 0 ?  lidocaine (XYLOCAINE)  2 % solution, , Disp: , Rfl:  ?  lidocaine-prilocaine (EMLA) cream, Apply 1 application topically as needed. (Patient not taking: Reported on 05/07/2021), Disp: 30 g, Rfl: 3 ?  LORazepam (ATIVAN) 0.5 MG

## 2021-05-14 ENCOUNTER — Encounter: Payer: Self-pay | Admitting: Oncology

## 2021-05-28 ENCOUNTER — Inpatient Hospital Stay: Payer: Medicare Other

## 2021-05-28 ENCOUNTER — Encounter: Payer: Self-pay | Admitting: Internal Medicine

## 2021-05-28 ENCOUNTER — Inpatient Hospital Stay: Payer: Medicare Other | Attending: Radiation Oncology

## 2021-05-28 ENCOUNTER — Inpatient Hospital Stay (HOSPITAL_BASED_OUTPATIENT_CLINIC_OR_DEPARTMENT_OTHER): Payer: Medicare Other | Admitting: Internal Medicine

## 2021-05-28 DIAGNOSIS — C3432 Malignant neoplasm of lower lobe, left bronchus or lung: Secondary | ICD-10-CM | POA: Insufficient documentation

## 2021-05-28 DIAGNOSIS — Z79899 Other long term (current) drug therapy: Secondary | ICD-10-CM | POA: Insufficient documentation

## 2021-05-28 DIAGNOSIS — E538 Deficiency of other specified B group vitamins: Secondary | ICD-10-CM | POA: Insufficient documentation

## 2021-05-28 DIAGNOSIS — Z5111 Encounter for antineoplastic chemotherapy: Secondary | ICD-10-CM | POA: Insufficient documentation

## 2021-05-28 DIAGNOSIS — E876 Hypokalemia: Secondary | ICD-10-CM | POA: Insufficient documentation

## 2021-05-28 DIAGNOSIS — J449 Chronic obstructive pulmonary disease, unspecified: Secondary | ICD-10-CM | POA: Diagnosis not present

## 2021-05-28 LAB — COMPREHENSIVE METABOLIC PANEL
ALT: 10 U/L (ref 0–44)
AST: 24 U/L (ref 15–41)
Albumin: 3.1 g/dL — ABNORMAL LOW (ref 3.5–5.0)
Alkaline Phosphatase: 93 U/L (ref 38–126)
Anion gap: 8 (ref 5–15)
BUN: 11 mg/dL (ref 8–23)
CO2: 24 mmol/L (ref 22–32)
Calcium: 8.8 mg/dL — ABNORMAL LOW (ref 8.9–10.3)
Chloride: 106 mmol/L (ref 98–111)
Creatinine, Ser: 0.79 mg/dL (ref 0.61–1.24)
GFR, Estimated: >60 mL/min (ref >60)
Glucose, Bld: 129 mg/dL — ABNORMAL HIGH (ref 70–99)
Potassium: 3.2 mmol/L — ABNORMAL LOW (ref 3.5–5.1)
Sodium: 138 mmol/L (ref 135–145)
Total Bilirubin: 0.4 mg/dL (ref 0.3–1.2)
Total Protein: 7.5 g/dL (ref 6.5–8.1)

## 2021-05-28 LAB — CBC WITH DIFFERENTIAL/PLATELET
Abs Immature Granulocytes: 0.04 10*3/uL (ref 0.00–0.07)
Basophils Absolute: 0 10*3/uL (ref 0.0–0.1)
Basophils Relative: 0 %
Eosinophils Absolute: 0.1 10*3/uL (ref 0.0–0.5)
Eosinophils Relative: 1 %
HCT: 32.2 % — ABNORMAL LOW (ref 39.0–52.0)
Hemoglobin: 9.7 g/dL — ABNORMAL LOW (ref 13.0–17.0)
Immature Granulocytes: 1 %
Lymphocytes Relative: 21 %
Lymphs Abs: 1.1 10*3/uL (ref 0.7–4.0)
MCH: 26.6 pg (ref 26.0–34.0)
MCHC: 30.1 g/dL (ref 30.0–36.0)
MCV: 88.2 fL (ref 80.0–100.0)
Monocytes Absolute: 0.8 10*3/uL (ref 0.1–1.0)
Monocytes Relative: 14 %
Neutro Abs: 3.5 10*3/uL (ref 1.7–7.7)
Neutrophils Relative %: 63 %
Platelets: 463 10*3/uL — ABNORMAL HIGH (ref 150–400)
RBC: 3.65 MIL/uL — ABNORMAL LOW (ref 4.22–5.81)
RDW: 17.5 % — ABNORMAL HIGH (ref 11.5–15.5)
WBC: 5.5 10*3/uL (ref 4.0–10.5)
nRBC: 0 % (ref 0.0–0.2)

## 2021-05-28 MED ORDER — HEPARIN SOD (PORK) LOCK FLUSH 100 UNIT/ML IV SOLN
500.0000 [IU] | Freq: Once | INTRAVENOUS | Status: AC | PRN
  Filled 2021-05-28: qty 5

## 2021-05-28 MED ORDER — CYANOCOBALAMIN 1000 MCG/ML IJ SOLN
1000.0000 ug | INTRAMUSCULAR | Status: DC
  Administered 2021-05-28: 1000 ug via INTRAMUSCULAR
  Filled 2021-05-28: qty 1

## 2021-05-28 MED ORDER — HEPARIN SOD (PORK) LOCK FLUSH 100 UNIT/ML IV SOLN
INTRAVENOUS | Status: AC
  Administered 2021-05-28: 500 [IU]
  Filled 2021-05-28: qty 5

## 2021-05-28 MED ORDER — SODIUM CHLORIDE 0.9 % IV SOLN
Freq: Once | INTRAVENOUS | Status: AC
  Filled 2021-05-28: qty 250

## 2021-05-28 MED ORDER — SODIUM CHLORIDE 0.9 % IV SOLN
10.0000 mg | Freq: Once | INTRAVENOUS | Status: AC
  Administered 2021-05-28: 10 mg via INTRAVENOUS
  Filled 2021-05-28: qty 10

## 2021-05-28 MED ORDER — SODIUM CHLORIDE 0.9 % IV SOLN
500.0000 mg/m2 | Freq: Once | INTRAVENOUS | Status: AC
  Administered 2021-05-28: 1000 mg via INTRAVENOUS
  Filled 2021-05-28: qty 40

## 2021-05-28 NOTE — Progress Notes (Signed)
I connected with Tracie Harrier on 05/28/2021 at  1:30 PM EDT by video enabled telemedicine visit and verified that I am speaking with the correct person using two identifiers.  ?I discussed the limitations, risks, security and privacy concerns of performing an evaluation and management service by telemedicine and the availability of in-person appointments. I also discussed with the patient that there may be a patient responsible charge related to this service. The patient expressed understanding and agreed to proceed.  ? ? ?Other persons participating in the visit and their role in the encounter: RN/medical reconciliation ?Patient?s location: office ?Provider?s location: home ? ?Oncology History  ?Malignant neoplasm of lower lobe of left lung ([place])  ?10/04/2019 Initial Diagnosis  ? Malignant neoplasm of lower lobe of left lung ([place]) ? ?  ?10/04/2019 Cancer Staging  ? Staging form: Lung, AJCC 8th Edition ?- Clinical stage from 10/04/2019: Stage IIIB (cT2b, cN3, cM0) - Signed by Sindy Guadeloupe, MD on 10/04/2019 ? ?  ?10/28/2019 - 11/18/2019 Chemotherapy  ? The patient had dexamethasone (DECADRON) 4 MG tablet, 8 mg, Oral, Daily, 1 of 1 cycle, Start date: 10/04/2019, End date: -- ?palonosetron (ALOXI) injection 0.25 mg, 0.25 mg, Intravenous,  Once, 4 of 4 cycles ?Administration: 0.25 mg (10/28/2019), 0.25 mg (11/04/2019), 0.25 mg (11/11/2019), 0.25 mg (11/18/2019) ?PACLitaxel-protein bound (ABRAXANE) chemo infusion 200 mg, 100 mg/m2 = 200 mg (100 % of original dose 100 mg/m2), Intravenous,  Once, 4 of 4 cycles ?Dose modification: 100 mg/m2 (original dose 100 mg/m2, Cycle 2), 80 mg/m2 (original dose 100 mg/m2, Cycle 3, Reason: Other (see comments), Comment: neutropenia), 100 mg/m2 (original dose 100 mg/m2, Cycle 1) ?Administration: 200 mg (11/04/2019), 175 mg (11/11/2019), 200 mg (10/29/2019), 175 mg (11/18/2019) ?CARBOplatin (PARAPLATIN) 210 mg in sodium chloride 0.9 % 250 mL chemo infusion, 210 mg (100 % of original dose 214.6 mg),  Intravenous,  Once, 4 of 4 cycles ?Dose modification:   (original dose 214.6 mg, Cycle 1) ?Administration: 210 mg (10/28/2019), 210 mg (11/04/2019), 210 mg (11/11/2019), 210 mg (11/18/2019) ?PACLitaxel (TAXOL) 90 mg in sodium chloride 0.9 % 250 mL chemo infusion (</= 80mg /m2), 45 mg/m2 = 90 mg, Intravenous,  Once, 1 of 1 cycle ?Administration: 90 mg (10/28/2019) ? ? for chemotherapy treatment.  ? ?  ?11/25/2019 - 11/25/2019 Chemotherapy  ? The patient had palonosetron (ALOXI) injection 0.25 mg, 0.25 mg, Intravenous,  Once, 1 of 1 cycle ?Administration: 0.25 mg (11/25/2019) ?PEMEtrexed (ALIMTA) 1,000 mg in sodium chloride 0.9 % 100 mL chemo infusion, 500 mg/m2 = 1,000 mg, Intravenous,  Once, 1 of 1 cycle ?Administration: 1,000 mg (11/25/2019) ?CARBOplatin (PARAPLATIN) 550 mg in sodium chloride 0.9 % 250 mL chemo infusion, 550 mg (100 % of original dose 548 mg), Intravenous,  Once, 1 of 1 cycle ?Dose modification:   (original dose 548 mg, Cycle 1) ?Administration: 550 mg (11/25/2019) ?fosaprepitant (EMEND) 150 mg in sodium chloride 0.9 % 145 mL IVPB, 150 mg, Intravenous,  Once, 1 of 1 cycle ?Administration: 150 mg (11/25/2019) ? ? for chemotherapy treatment.  ? ?  ?01/14/2020 - 11/13/2020 Chemotherapy  ? Patient is on Treatment Plan : LUNG Durvalumab q14d  ? ?  ?  ?12/11/2020 -  Chemotherapy  ? Patient is on Treatment Plan : LUNG NSCLC Pemetrexed + Carboplatin q21d x 4 Cycles  ? ?  ?  ? ?Chief Complaint: Lung cancer ? ? ?History of present illness:Mitchell Frye 71 y.o.  male with history of lung cancer [name]/recurrent hilar currently on maintenance Alimta is here for follow-up. ? ?  Patient complains of mild shortness of breath.  Denies any worsening cough.  Denies any hemoptysis.  Denies any chest pain.  No falls.  No headaches.  Complains of mild to moderate fatigue. ? ?Observation/objective: Alert & oriented x 3. In No acute distress.  ? ?Assessment and plan: ?Malignant neoplasm of lower lobe of left lung ([place]) ?#  stage IIIB adenocarcinoma of the left lung cT2 cN3 cM0 s/p concurrent chemoradiation with CarboTaxol chemotherapy.  He was found to have local hilar recurrence and is s/p 4 cycles of carbo Alimta chemotherapy.   ? ?#Patient currently on maintenance Alimta. Labs today reviewed;  acceptable for treatment today.  Patient continues to be on daily folic OTLX/B26 every 3 cycles.  Defer to Dr. Janese Banks regarding further imaging stating. ? ?# Hypokalemia: 3.2; recommend Kur 20 mwq BID; script sent.  ? ?# Dyspnea/COPD- chronic- recommend compliance with inhalers;  ? ?# DISPOSITION: ?# chemo today; give b12 injection today ?# in  3 weeks; Dr.rao- MD; labs- cbc/cmp;alimta- Dr.B ? ? ?Follow-up instructions: ? ?I discussed the assessment and treatment plan with the patient.  The patient was provided an opportunity to ask questions and all were answered.  The patient agreed with the plan and demonstrated understanding of instructions. ? ?The patient was advised to call back or seek an in person evaluation if the symptoms worsen or if the condition fails to improve as anticipated. ? ?Dr. Charlaine Dalton ?[facility] ?05/31/2021 ?12:16 PM ?

## 2021-05-28 NOTE — Assessment & Plan Note (Signed)
#?  stage IIIB?adenocarcinoma of the left lung cT2 cN3 cM0?s/p concurrent chemoradiation with CarboTaxol chemotherapy.??He was found to have local hilar recurrence and is s/p 4 cycles of carbo Alimta chemotherapy.?? ? ?#Patient currently on maintenance Alimta. Labs today reviewed;  acceptable for treatment today.  Patient continues to be on daily folic [id number]/U72 every 3 cycles.  Defer to Dr. Janese Banks regarding further imaging stating. ? ?# Hypokalemia: 3.2; recommend Kur 20 mwq BID; script sent.  ? ?# Dyspnea/COPD- chronic- recommend compliance with inhalers;  ? ?# DISPOSITION: ?# chemo today; give b12 injection today ?# in  3 weeks; Dr.rao- MD; labs- cbc/cmp;alimta- Dr.B ?

## 2021-05-28 NOTE — Patient Instructions (Signed)
#  Please make sure to use your inhalers as recommended-per the medication list ? ?#Take K-Dur  twice a day follow potassium ? ?#Take folic acid once a day. ?

## 2021-05-28 NOTE — Patient Instructions (Signed)
[facility] CANCER CTR AT [place]  Discharge Instructions: ?Thank you for choosing [facility] to provide your oncology and hematology care.  ?If you have a lab appointment with the [facility], please go directly to the [facility] and check in at the registration area. ? ?Wear comfortable clothing and clothing appropriate for easy access to any Portacath or PICC line.  ? ?We strive to give you quality time with your provider. You may need to reschedule your appointment if you arrive late (15 or more minutes).  Arriving late affects you and other patients whose appointments are after yours.  Also, if you miss three or more appointments without notifying the office, you may be dismissed from the clinic at the provider?s discretion.    ?  ?For prescription refill requests, have your pharmacy contact our office and allow 72 hours for refills to be completed.   ? ?Today you received the following chemotherapy and/or immunotherapy agents alimta    ?  ?To help prevent nausea and vomiting after your treatment, we encourage you to take your nausea medication as directed. ? ?BELOW ARE SYMPTOMS THAT SHOULD BE REPORTED IMMEDIATELY: ?*FEVER GREATER THAN 100.4 F (38 ?C) OR HIGHER ?*CHILLS OR SWEATING ?*NAUSEA AND VOMITING THAT IS NOT CONTROLLED WITH YOUR NAUSEA MEDICATION ?*UNUSUAL SHORTNESS OF BREATH ?*UNUSUAL BRUISING OR BLEEDING ?*URINARY PROBLEMS (pain or burning when urinating, or frequent urination) ?*BOWEL PROBLEMS (unusual diarrhea, constipation, pain near the anus) ?TENDERNESS IN MOUTH AND THROAT WITH OR WITHOUT PRESENCE OF ULCERS (sore throat, sores in mouth, or a toothache) ?UNUSUAL RASH, SWELLING OR PAIN  ?UNUSUAL VAGINAL DISCHARGE OR ITCHING  ? ?Items with * indicate a potential emergency and should be followed up as soon as possible or go to the Emergency Department if any problems should occur. ? ?Please show the CHEMOTHERAPY ALERT CARD or IMMUNOTHERAPY ALERT CARD at check-in to the  Emergency Department and triage nurse. ? ?Should you have questions after your visit or need to cancel or reschedule your appointment, please contact [facility] CANCER [place] AT [place]  [phone] and follow the prompts.  Office hours are 8:00 a.m. to 4:30 p.m. Monday - Friday. Please note that voicemails left after 4:00 p.m. may not be returned until the following business day.  We are closed weekends and major holidays. You have access to a nurse at all times for urgent questions. Please call the main number to the clinic [phone] and follow the prompts. ? ?For any non-urgent questions, you may also contact your provider using MyChart. We now offer e-Visits for anyone 64 and older to request care online for non-urgent symptoms. For details visit [email or web address]. ?  ?Also download the MyChart app! Go to the app store, search "MyChart", open the app, select [facility], and log in with your MyChart username and password. ? ?Due to Covid, a mask is required upon entering the hospital/clinic. If you do not have a mask, one will be given to you upon arrival. For doctor visits, patients may have 1 support person aged 77 or older with them. For treatment visits, patients cannot have anyone with them due to current Covid guidelines and our immunocompromised population.  ?

## 2021-05-31 ENCOUNTER — Encounter: Payer: Self-pay | Admitting: Oncology

## 2021-06-14 ENCOUNTER — Encounter: Payer: Self-pay | Admitting: Oncology

## 2021-06-18 ENCOUNTER — Inpatient Hospital Stay: Payer: Medicare HMO

## 2021-06-18 ENCOUNTER — Inpatient Hospital Stay (HOSPITAL_BASED_OUTPATIENT_CLINIC_OR_DEPARTMENT_OTHER): Payer: Medicare HMO | Admitting: Oncology

## 2021-06-18 ENCOUNTER — Inpatient Hospital Stay: Payer: Medicare HMO | Attending: Oncology

## 2021-06-18 ENCOUNTER — Encounter: Payer: Self-pay | Admitting: Oncology

## 2021-06-18 VITALS — BP 140/71 | HR 78 | Temp 97.5°F | Resp 18 | Wt 174.1 lb

## 2021-06-18 DIAGNOSIS — Z79899 Other long term (current) drug therapy: Secondary | ICD-10-CM | POA: Insufficient documentation

## 2021-06-18 DIAGNOSIS — C3491 Malignant neoplasm of unspecified part of right bronchus or lung: Secondary | ICD-10-CM | POA: Insufficient documentation

## 2021-06-18 DIAGNOSIS — R059 Cough, unspecified: Secondary | ICD-10-CM | POA: Diagnosis not present

## 2021-06-18 DIAGNOSIS — C3432 Malignant neoplasm of lower lobe, left bronchus or lung: Secondary | ICD-10-CM

## 2021-06-18 DIAGNOSIS — Z7951 Long term (current) use of inhaled steroids: Secondary | ICD-10-CM | POA: Insufficient documentation

## 2021-06-18 DIAGNOSIS — Z5111 Encounter for antineoplastic chemotherapy: Secondary | ICD-10-CM

## 2021-06-18 DIAGNOSIS — E785 Hyperlipidemia, unspecified: Secondary | ICD-10-CM | POA: Insufficient documentation

## 2021-06-18 DIAGNOSIS — E538 Deficiency of other specified B group vitamins: Secondary | ICD-10-CM | POA: Insufficient documentation

## 2021-06-18 DIAGNOSIS — I10 Essential (primary) hypertension: Secondary | ICD-10-CM | POA: Diagnosis not present

## 2021-06-18 DIAGNOSIS — Z87891 Personal history of nicotine dependence: Secondary | ICD-10-CM | POA: Insufficient documentation

## 2021-06-18 DIAGNOSIS — D649 Anemia, unspecified: Secondary | ICD-10-CM

## 2021-06-18 DIAGNOSIS — R0602 Shortness of breath: Secondary | ICD-10-CM | POA: Insufficient documentation

## 2021-06-18 LAB — COMPREHENSIVE METABOLIC PANEL
ALT: 8 U/L (ref 0–44)
AST: 22 U/L (ref 15–41)
Albumin: 3.2 g/dL — ABNORMAL LOW (ref 3.5–5.0)
Alkaline Phosphatase: 87 U/L (ref 38–126)
Anion gap: 7 (ref 5–15)
BUN: 13 mg/dL (ref 8–23)
CO2: 23 mmol/L (ref 22–32)
Calcium: 9.1 mg/dL (ref 8.9–10.3)
Chloride: 106 mmol/L (ref 98–111)
Creatinine, Ser: 0.94 mg/dL (ref 0.61–1.24)
GFR, Estimated: >60 mL/min (ref >60)
Glucose, Bld: 116 mg/dL — ABNORMAL HIGH (ref 70–99)
Potassium: 3.3 mmol/L — ABNORMAL LOW (ref 3.5–5.1)
Sodium: 136 mmol/L (ref 135–145)
Total Bilirubin: 0.6 mg/dL (ref 0.3–1.2)
Total Protein: 7.7 g/dL (ref 6.5–8.1)

## 2021-06-18 LAB — CBC WITH DIFFERENTIAL/PLATELET
Abs Immature Granulocytes: 0.02 10*3/uL (ref 0.00–0.07)
Basophils Absolute: 0 10*3/uL (ref 0.0–0.1)
Basophils Relative: 0 %
Eosinophils Absolute: 0.1 10*3/uL (ref 0.0–0.5)
Eosinophils Relative: 1 %
HCT: 31.1 % — ABNORMAL LOW (ref 39.0–52.0)
Hemoglobin: 9.5 g/dL — ABNORMAL LOW (ref 13.0–17.0)
Immature Granulocytes: 0 %
Lymphocytes Relative: 21 %
Lymphs Abs: 0.9 10*3/uL (ref 0.7–4.0)
MCH: 26.4 pg (ref 26.0–34.0)
MCHC: 30.5 g/dL (ref 30.0–36.0)
MCV: 86.4 fL (ref 80.0–100.0)
Monocytes Absolute: 0.6 10*3/uL (ref 0.1–1.0)
Monocytes Relative: 14 %
Neutro Abs: 2.9 10*3/uL (ref 1.7–7.7)
Neutrophils Relative %: 64 %
Platelets: 415 10*3/uL — ABNORMAL HIGH (ref 150–400)
RBC: 3.6 MIL/uL — ABNORMAL LOW (ref 4.22–5.81)
RDW: 17.1 % — ABNORMAL HIGH (ref 11.5–15.5)
WBC: 4.6 10*3/uL (ref 4.0–10.5)
nRBC: 0 % (ref 0.0–0.2)

## 2021-06-18 MED ORDER — HEPARIN SOD (PORK) LOCK FLUSH 100 UNIT/ML IV SOLN
500.0000 [IU] | Freq: Once | INTRAVENOUS | Status: AC | PRN
  Filled 2021-06-18: qty 5

## 2021-06-18 MED ORDER — SODIUM CHLORIDE 0.9 % IV SOLN
Freq: Once | INTRAVENOUS | Status: AC
  Filled 2021-06-18: qty 250

## 2021-06-18 MED ORDER — HEPARIN SOD (PORK) LOCK FLUSH 100 UNIT/ML IV SOLN
INTRAVENOUS | Status: AC
  Administered 2021-06-18: 500 [IU]
  Filled 2021-06-18: qty 5

## 2021-06-18 MED ORDER — SODIUM CHLORIDE 0.9 % IV SOLN
500.0000 mg/m2 | Freq: Once | INTRAVENOUS | Status: AC
  Administered 2021-06-18: 1000 mg via INTRAVENOUS
  Filled 2021-06-18: qty 40

## 2021-06-18 MED ORDER — SODIUM CHLORIDE 0.9 % IV SOLN
10.0000 mg | Freq: Once | INTRAVENOUS | Status: AC
  Administered 2021-06-18: 10 mg via INTRAVENOUS
  Filled 2021-06-18: qty 1

## 2021-06-18 NOTE — Progress Notes (Signed)
Pt will like to discuss SOB and if it will ever go away.  ?

## 2021-06-18 NOTE — Progress Notes (Signed)
? ? ? ?Hematology/Oncology Consult note ?[facility]  ?[phone] [phone] ? ?Patient Care Team: ?Center, [facility] as PCP - General ?Telford Nab, RN as Oncology Nurse Navigator  ? ?Name of the patient: Mitchell Frye  ?[id number]  ?Oct 25, 1950  ? ?Date of visit: 06/18/21 ? 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 ?Chief complaint/ Reason for visit-on treatment assessment prior to cycle 4 of maintenance Alimta ? ?Heme/Onc history:  Patient is a 71 year old male with a past medical history significant for hypertension and hyperlipidemia who presented to the ER with symptoms of worsening cough and shortness of breath.  He underwent CT angios chest which did not show any PE.  Soft tissue attenuation measuring 2.9 x 4 cm centered in the left hilum resulting in abrupt angulation and narrowing of the pulmonary arteries and the central left lower lobe airways.  Additional ipsilateral hilar and subcarinal adenopathy.  No contralateral adenopathy.  Consolidative masslike opacity 3.6 x 3.1 cm in size and contiguous with more central perihilar soft tissue attenuation.  Overall findings concerning for primary bronchogenic carcinoma.    ?Patient lives with his sister who is his main caregiver.  He does not drive but is independent of his ADLs.  Reports ongoing fatigue and occasional retrosternal chest pain.  He has been evaluated by GI in the past for reflux as well.  Appetie is fair and weight is stable.  Denies any significant shortness of breath at this time.  Patient is an ex-smoker and smoked for about 20 years but quit smoking back in 1994 ?  ?PET CT scan showed hypermetabolic mass in the left lower lobe with mediastinal adenopathy and right paratracheal adenopathy.  No evidence of distant metastatic disease.  Pathology was consistent with non-small cell lung cancer favor adenocarcinoma.  Cells were positive for TTF-1 and negative for  p40. ?  ?Patient started concurrent carbotaxol radiation and then had allergic reaction to Taxol for which she was switched to Abraxane.  Given the short supply of Abraxane patient received carbo Alimta midway through his concurrent chemoradiation.  Scans post chemoradiation showed partial response and patient will be started on maintenance durvalumab on 01/14/2020.  Patient was on maintenance durvalumab and received 15 cycles up until August 2022.  He was noted to have worsening chest pain and was in the ER multiple times in September 2022.  Repeat CT scan showed concern for hilar recurrence.  Patient received palliative radiation to the growing left upper lobe lung massWas given carbo Alimta for 4 cycles and presently on maintenance Alimta ?  ? ?Interval history-reports baseline exertional shortness of breath which is unchanged.  Denies any chest pain.  Ongoing fatigue. ? ?ECOG PS- 1 ?Pain scale- 0 ? ?Review of systems- Review of Systems  ?Constitutional:  Positive for malaise/fatigue. Negative for chills, fever and weight loss.  ?HENT:  Negative for congestion, ear discharge and nosebleeds.   ?Eyes:  Negative for blurred vision.  ?Respiratory:  Positive for shortness of breath. Negative for cough, hemoptysis, sputum production and wheezing.   ?Cardiovascular:  Negative for chest pain, palpitations, orthopnea and claudication.  ?Gastrointestinal:  Negative for abdominal pain, blood in stool, constipation, diarrhea, heartburn, melena, nausea and vomiting.  ?Genitourinary:  Negative for dysuria, flank pain, frequency, hematuria and urgency.  ?Musculoskeletal:  Negative for back pain, joint pain and myalgias.  ?Skin:  Negative for rash.  ?Neurological:  Negative for dizziness, tingling, focal weakness, seizures, weakness and headaches.  ?Endo/Heme/Allergies:  Does not bruise/bleed easily.  ?Psychiatric/Behavioral:  Negative for depression and suicidal ideas. The patient does not have insomnia.    ? ? ? ?Allergies   ?Allergen Reactions  ? Taxol [Paclitaxel] Other (See Comments)  ?  Chest pain, hip pain, coughing , wheezing  ? ? ? ?Past Medical History:  ?Diagnosis Date  ? Dyspnea   ? Hyperlipidemia   ? Hypertension   ? Primary malignant neoplasm of left lower lobe of lung ([place])   ? ? ? ?Past Surgical History:  ?Procedure Laterality Date  ? ESOPHAGOGASTRODUODENOSCOPY (EGD) WITH PROPOFOL N/A 12/01/2020  ? Procedure: ESOPHAGOGASTRODUODENOSCOPY (EGD) WITH PROPOFOL;  Surgeon: Lin Landsman, MD;  Location: [facility] ENDOSCOPY;  Service: Gastroenterology;  Laterality: N/A;  ? PORTA CATH INSERTION N/A 10/22/2019  ? Procedure: PORTA CATH INSERTION;  Surgeon: Katha Cabal, MD;  Location: [facility] CV LAB;  Service: Cardiovascular;  Laterality: N/A;  ? VIDEO BRONCHOSCOPY WITH ENDOBRONCHIAL ULTRASOUND N/A 09/25/2019  ? Procedure: VIDEO BRONCHOSCOPY WITH ENDOBRONCHIAL ULTRASOUND;  Surgeon: Tyler Pita, MD;  Location: [facility] ORS;  Service: Pulmonary;  Laterality: N/A;  ? ? ?Social History  ? ?Socioeconomic History  ? Marital status: Single  ?  Spouse name: Not on file  ? Number of children: Not on file  ? Years of education: Not on file  ? Highest education level: Not on file  ?Occupational History  ? Not on file  ?Tobacco Use  ? Smoking status: Former  ?  Packs/day: 1.00  ?  Years: 20.00  ?  Pack years: 20.00  ?  Types: Cigarettes  ?  Quit date: 54  ?  Years since quitting: 29.3  ? Smokeless tobacco: Never  ?Vaping Use  ? Vaping Use: Never used  ?Substance and Sexual Activity  ? Alcohol use: Not Currently  ?  Comment: not drank any beer in 2 onths  ? Drug use: Never  ? Sexual activity: Not Currently  ?Other Topics Concern  ? Not on file  ?Social History Narrative  ? Not on file  ? ?Social Determinants of Health  ? ?Financial Resource Strain: Not on file  ?Food Insecurity: Not on file  ?Transportation Needs: Not on file  ?Physical Activity: Not on file  ?Stress: Not on file  ?Social Connections: Not on file  ?Intimate Partner  Violence: Not on file  ? ? ?Family History  ?Problem Relation Age of Onset  ? Cancer Brother   ? ? ? ?Current Outpatient Medications:  ?  albuterol (VENTOLIN HFA) 108 (90 Base) MCG/ACT inhaler, Inhale 2 puffs into the lungs every 4 (four) hours as needed for wheezing or shortness of breath., Disp: 8 g, Rfl: 1 ?  ANORO ELLIPTA 62.5-25 MCG/ACT AEPB, Inhale 1 puff into the lungs daily., Disp: , Rfl:  ?  Budeson-Glycopyrrol-Formoterol (BREZTRI AEROSPHERE) 160-9-4.8 MCG/ACT AERO, Inhale 2 puffs into the lungs in the morning and at bedtime., Disp: 10.7 g, Rfl: 5 ?  DULoxetine (CYMBALTA) 30 MG capsule, Take 1 capsule (30 mg total) by mouth daily., Disp: 30 capsule, Rfl: 3 ?  acetaminophen (TYLENOL) 500 MG tablet, Take 1,000 mg by mouth every 6 (six) hours as needed for mild pain, moderate pain or headache. (Patient not taking: Reported on 05/07/2021), Disp: , Rfl:  ?  clobetasol cream (TEMOVATE) 0.05 %, Apply topically. (Patient not taking: Reported on 05/07/2021), Disp: , Rfl:  ?  DULoxetine (CYMBALTA) 60 MG capsule, Take 60 mg by mouth daily. (Patient not taking: Reported on 05/07/2021), Disp: , Rfl:  ?  FLOMAX 0.4 MG CAPS capsule, Take 0.4 mg by mouth daily. (Patient not taking: Reported on 05/07/2021), Disp: , Rfl:  ?  FLOVENT HFA 220 MCG/ACT inhaler, Inhale 2 puffs into the lungs 2 (two) times daily. (Patient not taking: Reported on 05/07/2021), Disp: , Rfl:  ?  folic acid (FOLVITE) 1 MG tablet, Take 1 tablet (1 mg total) by mouth daily. (Patient not taking: Reported on 05/07/2021), Disp: 30 tablet, Rfl: 6 ?  hydrOXYzine (ATARAX/VISTARIL) 25 MG tablet, Take 25 mg by mouth daily. (Patient not taking: Reported on 05/07/2021), Disp: , Rfl:  ?  lidocaine (LIDODERM) 5 %, Place 1 patch onto the skin daily. Remove & Discard patch within 12 hours or as directed by MD (Patient not taking: Reported on 05/07/2021), Disp: 30 patch, Rfl: 0 ?  lidocaine (XYLOCAINE) 2 % solution, , Disp: , Rfl:  ?  lidocaine-prilocaine (EMLA) cream, Apply 1  application topically as needed. (Patient not taking: Reported on 05/07/2021), Disp: 30 g, Rfl: 3 ?  LORazepam (ATIVAN) 0.5 MG tablet, Take 1 tablet (0.5 mg total) by mouth every 8 (eight) hours as neede

## 2021-06-18 NOTE — Patient Instructions (Signed)
[facility] CANCER CTR AT [place]  Discharge Instructions: ?Thank you for choosing [facility] to provide your oncology and hematology care.  ?If you have a lab appointment with the [facility], please go directly to the [facility] and check in at the registration area. ? ?Wear comfortable clothing and clothing appropriate for easy access to any Portacath or PICC line.  ? ?We strive to give you quality time with your provider. You may need to reschedule your appointment if you arrive late (15 or more minutes).  Arriving late affects you and other patients whose appointments are after yours.  Also, if you miss three or more appointments without notifying the office, you may be dismissed from the clinic at the provider?s discretion.    ?  ?For prescription refill requests, have your pharmacy contact our office and allow 72 hours for refills to be completed.   ? ?Today you received the following chemotherapy and/or immunotherapy agents Alimta    ?  ?To help prevent nausea and vomiting after your treatment, we encourage you to take your nausea medication as directed. ? ?BELOW ARE SYMPTOMS THAT SHOULD BE REPORTED IMMEDIATELY: ?*FEVER GREATER THAN 100.4 F (38 ?C) OR HIGHER ?*CHILLS OR SWEATING ?*NAUSEA AND VOMITING THAT IS NOT CONTROLLED WITH YOUR NAUSEA MEDICATION ?*UNUSUAL SHORTNESS OF BREATH ?*UNUSUAL BRUISING OR BLEEDING ?*URINARY PROBLEMS (pain or burning when urinating, or frequent urination) ?*BOWEL PROBLEMS (unusual diarrhea, constipation, pain near the anus) ?TENDERNESS IN MOUTH AND THROAT WITH OR WITHOUT PRESENCE OF ULCERS (sore throat, sores in mouth, or a toothache) ?UNUSUAL RASH, SWELLING OR PAIN  ?UNUSUAL VAGINAL DISCHARGE OR ITCHING  ? ?Items with * indicate a potential emergency and should be followed up as soon as possible or go to the Emergency Department if any problems should occur. ? ?Please show the CHEMOTHERAPY ALERT CARD or IMMUNOTHERAPY ALERT CARD at check-in to the  Emergency Department and triage nurse. ? ?Should you have questions after your visit or need to cancel or reschedule your appointment, please contact [facility] CANCER [place] AT [place]  [phone] and follow the prompts.  Office hours are 8:00 a.m. to 4:30 p.m. Monday - Friday. Please note that voicemails left after 4:00 p.m. may not be returned until the following business day.  We are closed weekends and major holidays. You have access to a nurse at all times for urgent questions. Please call the main number to the clinic [phone] and follow the prompts. ? ?For any non-urgent questions, you may also contact your provider using MyChart. We now offer e-Visits for anyone 72 and older to request care online for non-urgent symptoms. For details visit [email or web address]. ?  ?Also download the MyChart app! Go to the app store, search "MyChart", open the app, select [facility], and log in with your MyChart username and password. ? ?Due to Covid, a mask is required upon entering the hospital/clinic. If you do not have a mask, one will be given to you upon arrival. For doctor visits, patients may have 1 support person aged 72 or older with them. For treatment visits, patients cannot have anyone with them due to current Covid guidelines and our immunocompromised population.  ?

## 2021-06-21 ENCOUNTER — Encounter: Payer: Self-pay | Admitting: Oncology

## 2021-07-01 ENCOUNTER — Encounter: Payer: Self-pay | Admitting: Oncology

## 2021-07-02 ENCOUNTER — Ambulatory Visit
Admission: RE | Admit: 2021-07-02 | Discharge: 2021-07-02 | Disposition: A | Discharge status: Home / Self Care | Payer: Medicare HMO | Source: Ambulatory Visit | Attending: Oncology | Admitting: Oncology

## 2021-07-02 ENCOUNTER — Encounter: Payer: Self-pay | Admitting: Oncology

## 2021-07-02 DIAGNOSIS — C3432 Malignant neoplasm of lower lobe, left bronchus or lung: Secondary | ICD-10-CM | POA: Diagnosis present

## 2021-07-02 MED ORDER — IOHEXOL 300 MG/ML  SOLN
100.0000 mL | Freq: Once | INTRAMUSCULAR | Status: AC | PRN
  Administered 2021-07-02: 100 mL via INTRAVENOUS

## 2021-07-04 ENCOUNTER — Other Ambulatory Visit: Payer: Self-pay | Admitting: Oncology

## 2021-07-05 ENCOUNTER — Other Ambulatory Visit: Payer: Self-pay | Admitting: Oncology

## 2021-07-06 ENCOUNTER — Encounter: Payer: Self-pay | Admitting: Oncology

## 2021-07-07 ENCOUNTER — Encounter: Payer: Self-pay | Admitting: Oncology

## 2021-07-08 MED FILL — Dexamethasone Sodium Phosphate Inj 100 MG/10ML: INTRAMUSCULAR | Qty: 1 | Status: AC

## 2021-07-09 ENCOUNTER — Encounter: Payer: Self-pay | Admitting: Oncology

## 2021-07-09 ENCOUNTER — Inpatient Hospital Stay: Payer: Medicare HMO

## 2021-07-09 ENCOUNTER — Inpatient Hospital Stay (HOSPITAL_BASED_OUTPATIENT_CLINIC_OR_DEPARTMENT_OTHER): Payer: Medicare HMO | Admitting: Oncology

## 2021-07-09 VITALS — BP 148/83 | HR 79 | Temp 96.8°F | Resp 18 | Wt 173.2 lb

## 2021-07-09 DIAGNOSIS — Z5111 Encounter for antineoplastic chemotherapy: Secondary | ICD-10-CM

## 2021-07-09 DIAGNOSIS — C3432 Malignant neoplasm of lower lobe, left bronchus or lung: Secondary | ICD-10-CM

## 2021-07-09 DIAGNOSIS — E538 Deficiency of other specified B group vitamins: Secondary | ICD-10-CM

## 2021-07-09 LAB — CBC WITH DIFFERENTIAL/PLATELET
Abs Immature Granulocytes: 0.02 10*3/uL (ref 0.00–0.07)
Basophils Absolute: 0 10*3/uL (ref 0.0–0.1)
Basophils Relative: 0 %
Eosinophils Absolute: 0 10*3/uL (ref 0.0–0.5)
Eosinophils Relative: 0 %
HCT: 31.6 % — ABNORMAL LOW (ref 39.0–52.0)
Hemoglobin: 9.5 g/dL — ABNORMAL LOW (ref 13.0–17.0)
Immature Granulocytes: 0 %
Lymphocytes Relative: 20 %
Lymphs Abs: 1 10*3/uL (ref 0.7–4.0)
MCH: 25.9 pg — ABNORMAL LOW (ref 26.0–34.0)
MCHC: 30.1 g/dL (ref 30.0–36.0)
MCV: 86.1 fL (ref 80.0–100.0)
Monocytes Absolute: 0.7 10*3/uL (ref 0.1–1.0)
Monocytes Relative: 15 %
Neutro Abs: 3.1 10*3/uL (ref 1.7–7.7)
Neutrophils Relative %: 65 %
Platelets: 467 10*3/uL — ABNORMAL HIGH (ref 150–400)
RBC: 3.67 MIL/uL — ABNORMAL LOW (ref 4.22–5.81)
RDW: 17.9 % — ABNORMAL HIGH (ref 11.5–15.5)
WBC: 4.8 10*3/uL (ref 4.0–10.5)
nRBC: 0 % (ref 0.0–0.2)

## 2021-07-09 LAB — COMPREHENSIVE METABOLIC PANEL
ALT: 9 U/L (ref 0–44)
AST: 22 U/L (ref 15–41)
Albumin: 3.2 g/dL — ABNORMAL LOW (ref 3.5–5.0)
Alkaline Phosphatase: 92 U/L (ref 38–126)
Anion gap: 7 (ref 5–15)
BUN: 13 mg/dL (ref 8–23)
CO2: 24 mmol/L (ref 22–32)
Calcium: 8.8 mg/dL — ABNORMAL LOW (ref 8.9–10.3)
Chloride: 108 mmol/L (ref 98–111)
Creatinine, Ser: 1.02 mg/dL (ref 0.61–1.24)
GFR, Estimated: >60 mL/min (ref >60)
Glucose, Bld: 99 mg/dL (ref 70–99)
Potassium: 3.4 mmol/L — ABNORMAL LOW (ref 3.5–5.1)
Sodium: 139 mmol/L (ref 135–145)
Total Bilirubin: 0.4 mg/dL (ref 0.3–1.2)
Total Protein: 7.7 g/dL (ref 6.5–8.1)

## 2021-07-09 MED ORDER — SODIUM CHLORIDE 0.9 % IV SOLN
10.0000 mg | Freq: Once | INTRAVENOUS | Status: AC
  Administered 2021-07-09: 10 mg via INTRAVENOUS
  Filled 2021-07-09: qty 10

## 2021-07-09 MED ORDER — HEPARIN SOD (PORK) LOCK FLUSH 100 UNIT/ML IV SOLN
500.0000 [IU] | Freq: Once | INTRAVENOUS | Status: AC | PRN
  Administered 2021-07-09: 500 [IU]
  Filled 2021-07-09: qty 5

## 2021-07-09 MED ORDER — SODIUM CHLORIDE 0.9 % IV SOLN
Freq: Once | INTRAVENOUS | Status: AC
  Filled 2021-07-09: qty 250

## 2021-07-09 MED ORDER — SODIUM CHLORIDE 0.9 % IV SOLN
500.0000 mg/m2 | Freq: Once | INTRAVENOUS | Status: AC
  Administered 2021-07-09: 1000 mg via INTRAVENOUS
  Filled 2021-07-09: qty 40

## 2021-07-09 MED ORDER — CYANOCOBALAMIN 1000 MCG/ML IJ SOLN
1000.0000 ug | INTRAMUSCULAR | Status: DC
  Administered 2021-07-09: 1000 ug via INTRAMUSCULAR
  Filled 2021-07-09: qty 1

## 2021-07-09 NOTE — Patient Instructions (Signed)
[facility] CANCER CTR AT [place]  Discharge Instructions: Thank you for choosing [facility] to provide your oncology and hematology care.  If you have a lab appointment with the [facility], please go directly to the [facility] and check in at the registration area.  Wear comfortable clothing and clothing appropriate for easy access to any Portacath or PICC line.   We strive to give you quality time with your provider. You may need to reschedule your appointment if you arrive late (15 or more minutes).  Arriving late affects you and other patients whose appointments are after yours.  Also, if you miss three or more appointments without notifying the office, you may be dismissed from the clinic at the provider's discretion.      For prescription refill requests, have your pharmacy contact our office and allow 72 hours for refills to be completed.    Today you received the following chemotherapy and/or immunotherapy agents ALIMTA and B12       To help prevent nausea and vomiting after your treatment, we encourage you to take your nausea medication as directed.  BELOW ARE SYMPTOMS THAT SHOULD BE REPORTED IMMEDIATELY: *FEVER GREATER THAN 100.4 F (38 C) OR HIGHER *CHILLS OR SWEATING *NAUSEA AND VOMITING THAT IS NOT CONTROLLED WITH YOUR NAUSEA MEDICATION *UNUSUAL SHORTNESS OF BREATH *UNUSUAL BRUISING OR BLEEDING *URINARY PROBLEMS (pain or burning when urinating, or frequent urination) *BOWEL PROBLEMS (unusual diarrhea, constipation, pain near the anus) TENDERNESS IN MOUTH AND THROAT WITH OR WITHOUT PRESENCE OF ULCERS (sore throat, sores in mouth, or a toothache) UNUSUAL RASH, SWELLING OR PAIN  UNUSUAL VAGINAL DISCHARGE OR ITCHING   Items with * indicate a potential emergency and should be followed up as soon as possible or go to the Emergency Department if any problems should occur.  Please show the CHEMOTHERAPY ALERT CARD or IMMUNOTHERAPY ALERT CARD at  check-in to the Emergency Department and triage nurse.  Should you have questions after your visit or need to cancel or reschedule your appointment, please contact [facility] CANCER [place] AT [place]  [phone] and follow the prompts.  Office hours are 8:00 a.m. to 4:30 p.m. Monday - Friday. Please note that voicemails left after 4:00 p.m. may not be returned until the following business day.  We are closed weekends and major holidays. You have access to a nurse at all times for urgent questions. Please call the main number to the clinic [phone] and follow the prompts.  For any non-urgent questions, you may also contact your provider using MyChart. We now offer e-Visits for anyone 72 and older to request care online for non-urgent symptoms. For details visit [email or web address].   Also download the MyChart app! Go to the app store, search "MyChart", open the app, select [place], and log in with your MyChart username and password.  Due to Covid, a mask is required upon entering the hospital/clinic. If you do not have a mask, one will be given to you upon arrival. For doctor visits, patients may have 1 support person aged 72 or older with them. For treatment visits, patients cannot have anyone with them due to current Covid guidelines and our immunocompromised population.

## 2021-07-09 NOTE — Progress Notes (Signed)
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  He underwent CT angios chest which did not show any PE.  Soft tissue attenuation measuring 2.9 x 4 cm centered in the left hilum resulting in abrupt angulation and narrowing of the pulmonary arteries and the central left lower lobe airways.  Additional ipsilateral hilar and subcarinal adenopathy.  No contralateral adenopathy.  Consolidative masslike opacity 3.6 x 3.1 cm in size and contiguous with more central perihilar soft tissue attenuation.  Overall findings concerning for primary bronchogenic carcinoma.    Patient lives with his sister who is his main caregiver.  He does not drive but is independent of his ADLs.  Reports ongoing fatigue and occasional retrosternal chest pain.  He has been evaluated by GI in the past for reflux as well.  Appetie is fair and weight is stable.  Denies any significant shortness of breath at this time.  Patient is an ex-smoker and smoked for about 20 years but quit smoking back in 1994   PET CT scan showed hypermetabolic mass in the left lower lobe with mediastinal adenopathy and right paratracheal adenopathy.  No evidence of distant metastatic disease.  Pathology was consistent with non-small cell lung cancer favor adenocarcinoma.  Cells were positive for TTF-1 and negative for  p40.   Patient started concurrent carbotaxol radiation and then had allergic reaction to Taxol for which she was switched to Abraxane.  Given the short supply of Abraxane patient received carbo Alimta midway through his concurrent chemoradiation.  Scans post chemoradiation showed partial response and patient will be started on maintenance durvalumab on 01/14/2020.  Patient was on maintenance durvalumab and received 15 cycles up until August 2022.  He was noted to have worsening chest pain and was in the ER multiple times in September 2022.  Repeat CT scan showed concern for hilar recurrence.  Patient received palliative radiation to the growing left upper lobe lung massWas given carbo Alimta for 4 cycles and presently on maintenance Alimta    Interval history-patient reports no chest wall pain.  He has baseline fatigue and exertional shortness of breath.  No new complaints today.  ECOG PS- 1 Pain scale- 0   Review of systems- Review of Systems  Constitutional:  Positive for malaise/fatigue. Negative for chills, fever and weight loss.  HENT:  Negative for congestion, ear discharge and nosebleeds.   Eyes:  Negative for blurred vision.  Respiratory:  Positive for shortness of breath. Negative for cough, hemoptysis, sputum production and wheezing.   Cardiovascular:  Negative for chest pain, palpitations, orthopnea and claudication.  Gastrointestinal:  Negative for abdominal pain, blood in stool, constipation, diarrhea, heartburn, melena, nausea and vomiting.  Genitourinary:  Negative for dysuria, flank pain, frequency, hematuria and urgency.  Musculoskeletal:  Negative for back pain, joint pain and myalgias.  Skin:  Negative for rash.  Neurological:  Negative for dizziness, tingling, focal weakness, seizures, weakness and  headaches.  Endo/Heme/Allergies:  Does not bruise/bleed easily.  Psychiatric/Behavioral:  Negative for depression and suicidal ideas. The patient does not have insomnia.        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 ([place])      Past Surgical History:  Procedure Laterality Date   ESOPHAGOGASTRODUODENOSCOPY (EGD) WITH PROPOFOL N/A 12/01/2020   Procedure: ESOPHAGOGASTRODUODENOSCOPY (EGD) WITH PROPOFOL;  Surgeon: Lin Landsman, MD;  Location: [facility] END[facility];  Service: Gastroenterology;  Laterality: N/A;   PORTA CATH INSERTION N/A 10/22/2019   Procedure: PORTA CATH INSERTION;  Surgeon: Katha Cabal, MD;  Location: [place];  Service: Cardiovascular;  Laterality: N/A;   VIDEO BRONCHOSCOPY WITH ENDOBRONCHIAL ULTRASOUND N/A 09/25/2019   Procedure: VIDEO BRONCHOSCOPY WITH ENDOBRONCHIAL ULTRASOUND;  Surgeon: Tyler Pita, MD;  Location: [facility] ORS;  Service: Pulmonary;  Laterality: N/A;    Social History   Socioeconomic History   Marital status: Single    Spouse name: Not on file   Number of children: Not on file   Years of education: Not on file   Highest education level: Not on file  Occupational History   Not on file  Tobacco Use   Smoking status: Former    Packs/day: 1.00    Years: 20.00    Pack years: 20.00    Types: Cigarettes    Quit date: 18    Years since quitting: 29.4   Smokeless tobacco: Never  Vaping Use   Vaping Use: Never used  Substance and Sexual Activity   Alcohol use: Not Currently    Comment: not drank any beer in 2 onths   Drug use: Never   Sexual activity: Not Currently  Other Topics Concern   Not on file  Social History Narrative   Not on file   Social Determinants of Health   Financial Resource Strain: Not on file  Food Insecurity: Not on file  Transportation Needs: Not on file  Physical Activity: Not on file  Stress: Not on file  Social Connections: Not on file   Intimate Partner Violence: Not on file    Family History  Problem Relation Age of Onset   Cancer Brother      Current Outpatient Medications:    acetaminophen (TYLENOL) 500 MG tablet, Take 1,000 mg by mouth every 6 (six) hours as needed for mild pain, moderate pain or headache., Disp: , Rfl:    albuterol (VENTOLIN HFA) 108 (90 Base) MCG/ACT inhaler, Inhale 2 puffs into the lungs every 4 (four) hours as needed for wheezing or shortness of breath., Disp: 8 g, Rfl: 1   ANORO ELLIPTA 62.5-25 MCG/ACT AEPB, INHALE 1 PUFF INTO THE LUNGS DAILY (Patient not taking: Reported on 07/09/2021), Disp: 60 each, Rfl: 2   Budeson-Glycopyrrol-Formoterol (BREZTRI AEROSPHERE) 160-9-4.8 MCG/ACT AERO, Inhale 2 puffs into the lungs in the morning and at bedtime. (Patient not taking: Reported on 07/09/2021), Disp: 10.7 g, Rfl: 5   clobetasol cream (TEMOVATE) 0.05 %, Apply topically. (Patient not taking: Reported on 05/07/2021), Disp: , Rfl:    DULoxetine (CYMBALTA) 30 MG capsule, Take 1 capsule (30 mg total) by mouth daily., Disp: 30 capsule, Rfl: 3   DULoxetine (CYMBALTA) 60 MG capsule, Take 60 mg by mouth daily. (Patient  not taking: Reported on 05/07/2021), Disp: , Rfl:    FLOMAX 0.4 MG CAPS capsule, Take 0.4 mg by mouth daily. (Patient not taking: Reported on 05/07/2021), Disp: , Rfl:    FLOVENT HFA 220 MCG/ACT inhaler, Inhale 2 puffs into the lungs 2 (two) times daily. (Patient not taking: Reported on 05/07/2021), Disp: , Rfl:    folic acid (FOLVITE) 1 MG tablet, Take 1 tablet (1 mg total) by mouth daily. (Patient not taking: Reported on 05/07/2021), Disp: 30 tablet, Rfl: 6   hydrOXYzine (ATARAX/VISTARIL) 25 MG tablet, Take 25 mg by mouth daily. (Patient not taking: Reported on 05/07/2021), Disp: , Rfl:    lidocaine (LIDODERM) 5 %, Place 1 patch onto the skin daily. Remove & Discard patch within 12 hours or as directed by MD (Patient not taking: Reported on 05/07/2021), Disp: 30 patch, Rfl: 0   lidocaine (XYLOCAINE) 2 %  solution, , Disp: , Rfl:    lidocaine-prilocaine (EMLA) cream, Apply 1 application topically as needed. (Patient not taking: Reported on 05/07/2021), Disp: 30 g, Rfl: 3   LORazepam (ATIVAN) 0.5 MG tablet, Take 1 tablet (0.5 mg total) by mouth every 8 (eight) hours as needed for anxiety. (Patient not taking: Reported on 05/07/2021), Disp: 60 tablet, Rfl: 0   MIRALAX 17 GM/SCOOP powder, Take 17 g by mouth daily. (Patient not taking: Reported on 05/07/2021), Disp: , Rfl:    morphine (MS CONTIN) 30 MG 12 hr tablet, Take 1 tablet (30 mg total) by mouth every 8 (eight) hours. (Patient not taking: Reported on 05/07/2021), Disp: 90 tablet, Rfl: 0   naloxegol oxalate (MOVANTIK) 25 MG TABS tablet, Take 1 tablet (25 mg total) by mouth daily. (Patient not taking: Reported on 05/07/2021), Disp: 30 tablet, Rfl: 2   naloxone (NARCAN) nasal spray 4 mg/0.1 mL, SPRAY 1 SPRAY INTO ONE NOSTRIL AS DIRECTED FOR OPIOID OVERDOSE (TURN PERSON ON SIDE AFTER DOSE. IF NO RESPONSE IN 2-3 MINUTES OR PERSON RESPONDS BUT RELAPSES, REPEAT USING A NEW SPRAY DEVICE AND SPRAY INTO THE OTHER NOSTRIL. CALL 911 AFTER USE.) * EMERGENCY USE ONLY * (Patient not taking: Reported on 05/07/2021), Disp: 1 each, Rfl: 0   nystatin (MYCOSTATIN) 100000 UNIT/ML suspension, Take by mouth. (Patient not taking: Reported on 05/07/2021), Disp: , Rfl:    Oxycodone HCl 20 MG TABS, Take 1 tablet (20 mg total) by mouth every 3 (three) hours as needed (for breakthrough pain). (Patient not taking: Reported on 05/07/2021), Disp: 120 tablet, Rfl: 0   pantoprazole (PROTONIX) 40 MG tablet, Take 1 tablet (40mg ) twice daily for two weeks and then take 1 tablet daily thereafter (Patient not taking: Reported on 05/07/2021), Disp: 60 tablet, Rfl: 3   potassium chloride SA (KLOR-CON) 20 MEQ tablet, TAKE 1 TABLET BY MOUTH TWICE DAILY (Patient not taking: Reported on 05/07/2021), Disp: 30 tablet, Rfl: 1   sucralfate (CARAFATE) 1 g tablet, Take 1 tablet (1 g total) by mouth 4 (four) times  daily. (Patient not taking: Reported on 05/07/2021), Disp: 60 tablet, Rfl: 0  Physical exam:  Vitals:   07/09/21 0953  BP: (!) 148/83  Pulse: 79  Resp: 18  Temp: (!) 96.8 F (36 C)  SpO2: 100%  Weight: 173 lb 3.2 oz (78.6 kg)   Physical Exam Constitutional:      General: He is not in acute distress. Cardiovascular:     Rate and Rhythm: Normal rate and regular rhythm.     Heart sounds: Normal heart sounds.  Pulmonary:     Effort: Pulmonary effort  is normal.     Breath sounds: Normal breath sounds.  Abdominal:     General: Bowel sounds are normal.     Palpations: Abdomen is soft.  Skin:    General: Skin is warm and dry.  Neurological:     Mental Status: He is alert and oriented to person, place, and time.        Latest Ref Rng & Units 07/09/2021    9:39 AM  CMP  Glucose 70 - 99 mg/dL 99    BUN 8 - 23 mg/dL 13    Creatinine 0.61 - 1.24 mg/dL 1.02    Sodium 135 - 145 mmol/L 139    Potassium 3.5 - 5.1 mmol/L 3.4    Chloride 98 - 111 mmol/L 108    CO2 22 - 32 mmol/L 24    Calcium 8.9 - 10.3 mg/dL 8.8    Total Protein 6.5 - 8.1 g/dL 7.7    Total Bilirubin 0.3 - 1.2 mg/dL 0.4    Alkaline Phos 38 - 126 U/L 92    AST 15 - 41 U/L 22    ALT 0 - 44 U/L 9        Latest Ref Rng & Units 07/09/2021    9:39 AM  CBC  WBC 4.0 - 10.5 K/uL 4.8    Hemoglobin 13.0 - 17.0 g/dL 9.5    Hematocrit 39.0 - 52.0 % 31.6    Platelets 150 - 400 K/uL 467        CT CHEST ABDOMEN PELVIS W CONTRAST  Result Date: 07/03/2021 CLINICAL DATA:  Left lower lobe lung cancer restaging, status post radiation and chemotherapy, former smoker * Tracking Code: BO * EXAM: CT CHEST, ABDOMEN, AND PELVIS WITH CONTRAST TECHNIQUE: Multidetector CT imaging of the chest, abdomen and pelvis was performed following the standard protocol during bolus administration of intravenous contrast. RADIATION DOSE REDUCTION: This exam was performed according to the departmental dose-optimization program which includes automated  exposure control, adjustment of the mA and/or kV according to patient size and/or use of iterative reconstruction technique. CONTRAST:  140mL OMNIPAQUE IOHEXOL 300 MG/ML SOLN, additional oral enteric contrast COMPARISON:  04/12/2021 FINDINGS: CT CHEST FINDINGS Cardiovascular: Right chest port catheter. Scattered aortic atherosclerosis. Cardiomegaly. Unchanged trace pericardial effusion. Mediastinum/Nodes: Unchanged matted appearing post treatment soft tissue of the mediastinum and left hilum (series 8, image 22, 30). No discretely enlarged mediastinal, hilar, or axillary lymph nodes. Thyroid gland, trachea, and esophagus demonstrate no significant findings. Lungs/Pleura: No significant change in post treatment appearance of the left chest, with dense perihilar and suprahilar fibrosis and consolidation as well as some postobstructive airspace disease of the posterior left upper lobe (series 2, image 61, 70). Unchanged small left pleural effusion without evident pleural thickening or nodularity. Mild underlying centrilobular emphysema. Minimal scarring or atelectasis of the right lung base, unchanged. Musculoskeletal: No chest wall mass or suspicious osseous lesions identified. CT ABDOMEN PELVIS FINDINGS Hepatobiliary: No solid liver abnormality is seen. No gallstones, gallbladder wall thickening, or biliary dilatation. Pancreas: Unchanged cystic lesion of the ventral pancreatic body measuring 1.4 x 1.2 cm (series 8, image 63). No pancreatic ductal dilatation or surrounding inflammatory changes. Spleen: Normal in size without significant abnormality. Adrenals/Urinary Tract: Adrenal glands are unremarkable. Simple, benign right renal cortical cysts, for which no further follow-up or characterization is required. Kidneys are otherwise normal, without renal calculi, solid lesion, or hydronephrosis. Bladder is unremarkable. Stomach/Bowel: Stomach is within normal limits. Appendix is not clearly visualized. No evidence of  bowel wall  thickening, distention, or inflammatory changes. Large burden of stool throughout the colon. Vascular/Lymphatic: Aortic atherosclerosis. No enlarged abdominal or pelvic lymph nodes. Reproductive: Prostatomegaly with TURP defect. Other: No abdominal wall hernia or abnormality. Unchanged small volume free fluid in the low pelvis (series 8, image 107). Musculoskeletal: No acute osseous findings. IMPRESSION: 1. No significant change in post treatment/post radiation appearance of the left chest, with dense perihilar and suprahilar fibrosis and consolidation as well as some postobstructive airspace disease of the posterior left upper lobe. 2. Unchanged small left pleural effusion without evident pleural thickening or nodularity. Findings are consistent with post treatment change of the left chest. No evidence of recurrent or metastatic disease. 3. Unchanged matted appearing post treatment/post radiation soft tissue of the mediastinum and left hilum. No discretely enlarged mediastinal, hilar, or axillary lymph nodes. 4. No evidence of lymphadenopathy or metastatic disease in the abdomen or pelvis. 5. Unchanged cystic lesion of the ventral pancreatic body measuring 1.4 x 1.2 cm. 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 Attention on oncologic follow-up; no specific further follow-up or characterization is required. 6. Prostatomegaly. 7. Unchanged nonspecific small volume free fluid in the low pelvis. Aortic Atherosclerosis (ICD10-I70.0) and Emphysema (ICD10-J43.9). Electronically Signed   By: Delanna Ahmadi M.D.   On: 07/03/2021 20:14     Assessment and plan- Patient is a 71 y.o. 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  He was found to have local hilar recurrence and is s/p 4 cycles of carbo Alimta chemotherapy.  He is here for on treatment assessment prior to cycle 6 of maintenance Alimta  Counts okay to proceed  with cycle 6 of maintenance Alimta today.  We will repeat CT chest abdomen and pelvis with contrast in 2 weeks.  I will see him back in 3 weeks for cycle 7.  Patient will also receive his B12 injection today.   Visit Diagnosis 1. Malignant neoplasm of lower lobe of left lung ([place])   2. Encounter for antineoplastic chemotherapy      Dr. Randa Evens, MD, MPH [facility] [phone] 07/09/2021 5:24 PM

## 2021-07-09 NOTE — Progress Notes (Signed)
Pt states that when he wakes up in the mornings his eyes feel "stuck together like glue" has een going on about 3 weeks and states they are itchy at times.

## 2021-07-09 NOTE — Progress Notes (Signed)
Nutrition Follow-up:   Patient with lung cancer and receiving alimta.    Met with patient during infusion.  Patient eating peanut butter crackers.  Says that he is eating 3 meals per day.  Usually has eggs, bacon/sausage, grits for breakfast.  Lunch is pork chop, potato salad and he has a evening meal as well.  Drinking 3 ensure shakes a day.  "I got those shakes you sent me."  [employer] providing shakes.   Denies nausea.  Takes miralax for constipation. Says that his pain in chest is gone.     Medications: reviewed  Labs: reviewed  Anthropometrics:   Weight 173 lb 3.2 oz today  165 lb 14.4 oz on 3/3 168 lb on 2/10 164 lb on 1/20   NUTRITION DIAGNOSIS: Inadequate oral intake improved   INTERVENTION:  Continue oral nutrition supplements Continue eating good sources of protein RD available as needed    NEXT VISIT: no follow-up with weight increase RD available as needed  Gessica Jawad B. Zenia Resides, [place], [place] Registered Dietitian [phone]

## 2021-07-23 ENCOUNTER — Ambulatory Visit: Payer: Medicare HMO

## 2021-07-28 ENCOUNTER — Ambulatory Visit
Admission: RE | Admit: 2021-07-28 | Discharge: 2021-07-28 | Disposition: A | Discharge status: Home / Self Care | Payer: Medicare HMO | Source: Ambulatory Visit | Attending: Oncology | Admitting: Oncology

## 2021-07-28 DIAGNOSIS — C3432 Malignant neoplasm of lower lobe, left bronchus or lung: Secondary | ICD-10-CM | POA: Diagnosis present

## 2021-07-28 MED ORDER — IOHEXOL 300 MG/ML  SOLN
100.0000 mL | Freq: Once | INTRAMUSCULAR | Status: AC | PRN
  Administered 2021-07-28: 100 mL via INTRAVENOUS

## 2021-07-29 MED FILL — Dexamethasone Sodium Phosphate Inj 100 MG/10ML: INTRAMUSCULAR | Qty: 1 | Status: AC

## 2021-07-30 ENCOUNTER — Inpatient Hospital Stay: Payer: Medicare HMO

## 2021-07-30 ENCOUNTER — Inpatient Hospital Stay (HOSPITAL_BASED_OUTPATIENT_CLINIC_OR_DEPARTMENT_OTHER): Payer: Medicare HMO | Admitting: Oncology

## 2021-07-30 ENCOUNTER — Encounter: Payer: Self-pay | Admitting: Oncology

## 2021-07-30 ENCOUNTER — Inpatient Hospital Stay: Payer: Medicare HMO | Attending: Radiation Oncology

## 2021-07-30 VITALS — BP 132/90 | HR 82 | Temp 97.2°F | Resp 18 | Wt 174.4 lb

## 2021-07-30 DIAGNOSIS — Z7189 Other specified counseling: Secondary | ICD-10-CM | POA: Diagnosis not present

## 2021-07-30 DIAGNOSIS — C3432 Malignant neoplasm of lower lobe, left bronchus or lung: Secondary | ICD-10-CM

## 2021-07-30 DIAGNOSIS — Z5111 Encounter for antineoplastic chemotherapy: Secondary | ICD-10-CM | POA: Diagnosis present

## 2021-07-30 DIAGNOSIS — Z87891 Personal history of nicotine dependence: Secondary | ICD-10-CM | POA: Diagnosis not present

## 2021-07-30 LAB — CBC WITH DIFFERENTIAL/PLATELET
Abs Immature Granulocytes: 0.01 10*3/uL (ref 0.00–0.07)
Basophils Absolute: 0 10*3/uL (ref 0.0–0.1)
Basophils Relative: 0 %
Eosinophils Absolute: 0 10*3/uL (ref 0.0–0.5)
Eosinophils Relative: 1 %
HCT: 31.7 % — ABNORMAL LOW (ref 39.0–52.0)
Hemoglobin: 9.6 g/dL — ABNORMAL LOW (ref 13.0–17.0)
Immature Granulocytes: 0 %
Lymphocytes Relative: 21 %
Lymphs Abs: 1.1 10*3/uL (ref 0.7–4.0)
MCH: 26.2 pg (ref 26.0–34.0)
MCHC: 30.3 g/dL (ref 30.0–36.0)
MCV: 86.6 fL (ref 80.0–100.0)
Monocytes Absolute: 0.7 10*3/uL (ref 0.1–1.0)
Monocytes Relative: 13 %
Neutro Abs: 3.3 10*3/uL (ref 1.7–7.7)
Neutrophils Relative %: 65 %
Platelets: 396 10*3/uL (ref 150–400)
RBC: 3.66 MIL/uL — ABNORMAL LOW (ref 4.22–5.81)
RDW: 19.3 % — ABNORMAL HIGH (ref 11.5–15.5)
WBC: 5.1 10*3/uL (ref 4.0–10.5)
nRBC: 0 % (ref 0.0–0.2)

## 2021-07-30 LAB — COMPREHENSIVE METABOLIC PANEL
ALT: 7 U/L (ref 0–44)
AST: 18 U/L (ref 15–41)
Albumin: 3.2 g/dL — ABNORMAL LOW (ref 3.5–5.0)
Alkaline Phosphatase: 95 U/L (ref 38–126)
Anion gap: 7 (ref 5–15)
BUN: 13 mg/dL (ref 8–23)
CO2: 23 mmol/L (ref 22–32)
Calcium: 8.8 mg/dL — ABNORMAL LOW (ref 8.9–10.3)
Chloride: 108 mmol/L (ref 98–111)
Creatinine, Ser: 1 mg/dL (ref 0.61–1.24)
GFR, Estimated: >60 mL/min (ref >60)
Glucose, Bld: 95 mg/dL (ref 70–99)
Potassium: 3.8 mmol/L (ref 3.5–5.1)
Sodium: 138 mmol/L (ref 135–145)
Total Bilirubin: 0.8 mg/dL (ref 0.3–1.2)
Total Protein: 7.5 g/dL (ref 6.5–8.1)

## 2021-07-30 MED ORDER — HEPARIN SOD (PORK) LOCK FLUSH 100 UNIT/ML IV SOLN
500.0000 [IU] | Freq: Once | INTRAVENOUS | Status: AC | PRN
  Administered 2021-07-30: 500 [IU]
  Filled 2021-07-30: qty 5

## 2021-07-30 MED ORDER — SODIUM CHLORIDE 0.9 % IV SOLN
Freq: Once | INTRAVENOUS | Status: AC
  Filled 2021-07-30: qty 250

## 2021-07-30 MED ORDER — SODIUM CHLORIDE 0.9 % IV SOLN
10.0000 mg | Freq: Once | INTRAVENOUS | Status: AC
  Administered 2021-07-30: 10 mg via INTRAVENOUS
  Filled 2021-07-30: qty 10

## 2021-07-30 MED ORDER — SODIUM CHLORIDE 0.9 % IV SOLN
500.0000 mg/m2 | Freq: Once | INTRAVENOUS | Status: AC
  Administered 2021-07-30: 1000 mg via INTRAVENOUS
  Filled 2021-07-30: qty 40

## 2021-07-30 NOTE — Progress Notes (Signed)
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  He underwent CT angios chest which did not show any PE.  Soft tissue attenuation measuring 2.9 x 4 cm centered in the left hilum resulting in abrupt angulation and narrowing of the pulmonary arteries and the central left lower lobe airways.  Additional ipsilateral hilar and subcarinal adenopathy.  No contralateral adenopathy.  Consolidative masslike opacity 3.6 x 3.1 cm in size and contiguous with more central perihilar soft tissue attenuation.  Overall findings concerning for primary bronchogenic carcinoma.    Patient lives with his sister who is his main caregiver.  He does not drive but is independent of his ADLs.  Reports ongoing fatigue and occasional retrosternal chest pain.  He has been evaluated by GI in the past for reflux as well.  Appetie is fair and weight is stable.  Denies any significant shortness of breath at this time.  Patient is an ex-smoker and smoked for about 20 years but quit smoking back in 1994   PET CT scan showed hypermetabolic mass in the left lower lobe with mediastinal adenopathy and right paratracheal adenopathy.  No evidence of distant metastatic disease.  Pathology was consistent with non-small cell lung cancer favor adenocarcinoma.  Cells were  positive for TTF-1 and negative for p40.   Patient started concurrent carbotaxol radiation and then had allergic reaction to Taxol for which she was switched to Abraxane.  Given the short supply of Abraxane patient received carbo Alimta midway through his concurrent chemoradiation.  Scans post chemoradiation showed partial response and patient will be started on maintenance durvalumab on 01/14/2020.  Patient was on maintenance durvalumab and received 15 cycles up until August 2022.  He was noted to have worsening chest pain and was in the ER multiple times in September 2022.  Repeat CT scan showed concern for hilar recurrence.  Patient received palliative radiation to the growing left upper lobe lung massWas given carbo Alimta for 4 cycles and presently on maintenance Alimta    Interval history-reports doing well overall.  Denies any chest wall pain.  Has not had any recent falls.  Appetite and weight have remained stable.  He has baseline exertional shortness of breath.  ECOG PS- 1 Pain scale- 0 Opioid associated constipation- no  Review of systems- Review of Systems  Constitutional:  Positive for malaise/fatigue. Negative for chills, fever and weight loss.  HENT:  Negative for congestion, ear discharge and nosebleeds.   Eyes:  Negative for blurred vision.  Respiratory:  Positive for shortness of breath. Negative for cough, hemoptysis, sputum production and wheezing.   Cardiovascular:  Negative for chest pain, palpitations, orthopnea and claudication.  Gastrointestinal:  Negative for abdominal pain, blood in stool, constipation, diarrhea, heartburn, melena, nausea and vomiting.  Genitourinary:  Negative for dysuria, flank pain, frequency, hematuria and urgency.  Musculoskeletal:  Negative for back pain, joint pain and  myalgias.  Skin:  Negative for rash.  Neurological:  Negative for dizziness, tingling, focal weakness, seizures, weakness and headaches.  Endo/Heme/Allergies:  Does not  bruise/bleed easily.  Psychiatric/Behavioral:  Negative for depression and suicidal ideas. The patient does not have insomnia.       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 ([place])      Past Surgical History:  Procedure Laterality Date   ESOPHAGOGASTRODUODENOSCOPY (EGD) WITH PROPOFOL N/A 12/01/2020   Procedure: ESOPHAGOGASTRODUODENOSCOPY (EGD) WITH PROPOFOL;  Surgeon: Lin Landsman, MD;  Location: [facility] END[facility];  Service: Gastroenterology;  Laterality: N/A;   PORTA CATH INSERTION N/A 10/22/2019   Procedure: PORTA CATH INSERTION;  Surgeon: Katha Cabal, MD;  Location: [place] [facility];  Service: Cardiovascular;  Laterality: N/A;   VIDEO BRONCHOSCOPY WITH ENDOBRONCHIAL ULTRASOUND N/A 09/25/2019   Procedure: VIDEO BRONCHOSCOPY WITH ENDOBRONCHIAL ULTRASOUND;  Surgeon: Tyler Pita, MD;  Location: [facility] ORS;  Service: Pulmonary;  Laterality: N/A;    Social History   Socioeconomic History   Marital status: Single    Spouse name: Not on file   Number of children: Not on file   Years of education: Not on file   Highest education level: Not on file  Occupational History   Not on file  Tobacco Use   Smoking status: Former    Packs/day: 1.00    Years: 20.00    Total pack years: 20.00    Types: Cigarettes    Quit date: 22    Years since quitting: 29.4   Smokeless tobacco: Never  Vaping Use   Vaping Use: Never used  Substance and Sexual Activity   Alcohol use: Not Currently    Comment: not drank any beer in 2 onths   Drug use: Never   Sexual activity: Not Currently  Other Topics Concern   Not on file  Social History Narrative   Not on file   Social Determinants of Health   Financial Resource Strain: Not on file  Food Insecurity:  Not on file  Transportation Needs: Not on file  Physical Activity: Not on file  Stress: Not on file  Social Connections: Not on file  Intimate Partner Violence: Not on file    Family History  Problem Relation Age of Onset   Cancer Brother      Current Outpatient Medications:    acetaminophen (TYLENOL) 500 MG tablet, Take 1,000 mg by mouth every 6 (six) hours as needed for mild pain, moderate pain or headache., Disp: , Rfl:    albuterol (VENTOLIN HFA) 108 (90 Base) MCG/ACT inhaler, Inhale 2 puffs into the lungs every 4 (four) hours as needed for wheezing or shortness of breath., Disp: 8 g, Rfl: 1   DULoxetine (CYMBALTA) 30 MG capsule, Take 1 capsule (30 mg total) by mouth daily., Disp: 30 capsule, Rfl: 3   ANORO ELLIPTA 62.5-25 MCG/ACT AEPB, INHALE 1 PUFF INTO THE LUNGS DAILY (Patient not taking: Reported on 07/09/2021), Disp: 60 each, Rfl: 2   Budeson-Glycopyrrol-Formoterol (BREZTRI AEROSPHERE) 160-9-4.8 MCG/ACT AERO, Inhale 2 puffs into the lungs in the morning and at bedtime. (Patient not taking: Reported on 07/09/2021), Disp: 10.7 g, Rfl: 5   clobetasol cream (TEMOVATE) 0.05 %, Apply topically. (Patient not taking: Reported  on 05/07/2021), Disp: , Rfl:    DULoxetine (CYMBALTA) 60 MG capsule, Take 60 mg by mouth daily. (Patient not taking: Reported on 05/07/2021), Disp: , Rfl:    FLOMAX 0.4 MG CAPS capsule, Take 0.4 mg by mouth daily. (Patient not taking: Reported on 05/07/2021), Disp: , Rfl:    FLOVENT HFA 220 MCG/ACT inhaler, Inhale 2 puffs into the lungs 2 (two) times daily. (Patient not taking: Reported on 05/07/2021), Disp: , Rfl:    folic acid (FOLVITE) 1 MG tablet, Take 1 tablet (1 mg total) by mouth daily. (Patient not taking: Reported on 05/07/2021), Disp: 30 tablet, Rfl: 6   hydrOXYzine (ATARAX/VISTARIL) 25 MG tablet, Take 25 mg by mouth daily. (Patient not taking: Reported on 05/07/2021), Disp: , Rfl:    lidocaine (LIDODERM) 5 %, Place 1 patch onto the skin daily. Remove & Discard  patch within 12 hours or as directed by MD (Patient not taking: Reported on 05/07/2021), Disp: 30 patch, Rfl: 0   lidocaine (XYLOCAINE) 2 % solution, , Disp: , Rfl:    lidocaine-prilocaine (EMLA) cream, Apply 1 application topically as needed. (Patient not taking: Reported on 05/07/2021), Disp: 30 g, Rfl: 3   LORazepam (ATIVAN) 0.5 MG tablet, Take 1 tablet (0.5 mg total) by mouth every 8 (eight) hours as needed for anxiety. (Patient not taking: Reported on 05/07/2021), Disp: 60 tablet, Rfl: 0   MIRALAX 17 GM/SCOOP powder, Take 17 g by mouth daily. (Patient not taking: Reported on 05/07/2021), Disp: , Rfl:    morphine (MS CONTIN) 30 MG 12 hr tablet, Take 1 tablet (30 mg total) by mouth every 8 (eight) hours. (Patient not taking: Reported on 05/07/2021), Disp: 90 tablet, Rfl: 0   naloxegol oxalate (MOVANTIK) 25 MG TABS tablet, Take 1 tablet (25 mg total) by mouth daily. (Patient not taking: Reported on 05/07/2021), Disp: 30 tablet, Rfl: 2   naloxone (NARCAN) nasal spray 4 mg/0.1 mL, SPRAY 1 SPRAY INTO ONE NOSTRIL AS DIRECTED FOR OPIOID OVERDOSE (TURN PERSON ON SIDE AFTER DOSE. IF NO RESPONSE IN 2-3 MINUTES OR PERSON RESPONDS BUT RELAPSES, REPEAT USING A NEW SPRAY DEVICE AND SPRAY INTO THE OTHER NOSTRIL. CALL 911 AFTER USE.) * EMERGENCY USE ONLY * (Patient not taking: Reported on 05/07/2021), Disp: 1 each, Rfl: 0   nystatin (MYCOSTATIN) 100000 UNIT/ML suspension, Take by mouth. (Patient not taking: Reported on 05/07/2021), Disp: , Rfl:    Oxycodone HCl 20 MG TABS, Take 1 tablet (20 mg total) by mouth every 3 (three) hours as needed (for breakthrough pain). (Patient not taking: Reported on 05/07/2021), Disp: 120 tablet, Rfl: 0   pantoprazole (PROTONIX) 40 MG tablet, Take 1 tablet (40mg ) twice daily for two weeks and then take 1 tablet daily thereafter (Patient not taking: Reported on 05/07/2021), Disp: 60 tablet, Rfl: 3   potassium chloride SA (KLOR-CON) 20 MEQ tablet, TAKE 1 TABLET BY MOUTH TWICE DAILY (Patient not  taking: Reported on 05/07/2021), Disp: 30 tablet, Rfl: 1   sucralfate (CARAFATE) 1 g tablet, Take 1 tablet (1 g total) by mouth 4 (four) times daily. (Patient not taking: Reported on 05/07/2021), Disp: 60 tablet, Rfl: 0  Physical exam:  Vitals:   07/30/21 0928  BP: 132/90  Pulse: 82  Resp: 18  Temp: (!) 97.2 F (36.2 C)  SpO2: 99%  Weight: 174 lb 6.4 oz (79.1 kg)   Physical Exam Cardiovascular:     Rate and Rhythm: Normal rate and regular rhythm.     Heart sounds: Normal heart sounds.  Pulmonary:  Effort: Pulmonary effort is normal.     Breath sounds: Wheezing present.  Abdominal:     General: Bowel sounds are normal.     Palpations: Abdomen is soft.  Skin:    General: Skin is warm and dry.  Neurological:     Mental Status: He is alert and oriented to person, place, and time.         Latest Ref Rng & Units 07/30/2021    9:06 AM  CMP  Glucose 70 - 99 mg/dL 95   BUN 8 - 23 mg/dL 13   Creatinine 0.61 - 1.24 mg/dL 1.00   Sodium 135 - 145 mmol/L 138   Potassium 3.5 - 5.1 mmol/L 3.8   Chloride 98 - 111 mmol/L 108   CO2 22 - 32 mmol/L 23   Calcium 8.9 - 10.3 mg/dL 8.8   Total Protein 6.5 - 8.1 g/dL 7.5   Total Bilirubin 0.3 - 1.2 mg/dL 0.8   Alkaline Phos 38 - 126 U/L 95   AST 15 - 41 U/L 18   ALT 0 - 44 U/L 7       Latest Ref Rng & Units 07/30/2021    9:06 AM  CBC  WBC 4.0 - 10.5 K/uL 5.1   Hemoglobin 13.0 - 17.0 g/dL 9.6   Hematocrit 39.0 - 52.0 % 31.7   Platelets 150 - 400 K/uL 396     No images are attached to the encounter.  CT CHEST ABDOMEN PELVIS W CONTRAST  Result Date: 07/30/2021 CLINICAL DATA:  Lung cancer restaging. EXAM: CT CHEST, ABDOMEN, AND PELVIS WITH CONTRAST TECHNIQUE: Multidetector CT imaging of the chest, abdomen and pelvis was performed following the standard protocol during bolus administration of intravenous contrast. RADIATION DOSE REDUCTION: This exam was performed according to the departmental dose-optimization program which includes  automated exposure control, adjustment of the mA and/or kV according to patient size and/or use of iterative reconstruction technique. CONTRAST:  159mL OMNIPAQUE IOHEXOL 300 MG/ML  SOLN COMPARISON:  Chest, abdomen and pelvis CTs with contrast 04/09/2021 and 07/02/2021 FINDINGS: CT CHEST FINDINGS Cardiovascular: There is pericardial effusion up to 1.8 cm in thickness to the right 1.1 cm anteriorly. The pericardial fluid slightly increased since the last CT. The heart is slightly enlarged. Pulmonary veins are decompressed. Pulmonary arteries are normal caliber and centrally clear. There is aortic atherosclerosis without aneurysm or dissection. Right chest port catheter again terminates in the upper right atrium. Mediastinum/Nodes: Confluent soft tissue attenuation in the left hilar region encompassing central bronchovascular structures continues to be seen with extension to adjacent portions of the mediastinum. Central bronchi again show narrowing through this area of soft tissue and there is again noted left-sided volume loss. This is difficult to measure but the largest portion of this is approximally 5.0 x 4.0 cm on 2:27, at a similar level on the last CT was 4.7 x 3.7 cm. There is no further suggestion of intrathoracic adenopathy. No thyroid or axillary mass is seen. No focal esophageal thickening. Lungs/Pleura: Small to moderate sized left pleural effusion partially loculated in the chest base continues to be seen. This is no larger than previously. No pleural thickening or nodularity is seen and no pneumothorax. Dense perihilar/suprahilar fibrotic change is again noted with patchy consolidation again in the posterior left upper lobe and superior segment of the left lower lobe which could be postobstructive or post XRT pneumonitis. This shows no worsening or improvement. Coarse linear scar-like markings in the anterior lower lung field are again shown.  There is no right pleural effusion. There is mild  centrilobular emphysema. Chronic ground-glass disease in the basal right lower lobe segments is again noted and mild bronchiectasis. Musculoskeletal: Mild thoracic dextroscoliosis. No destructive thoracic cage bone lesion. CT ABDOMEN PELVIS FINDINGS Hepatobiliary: No masses seen in the liver. Unremarkable gallbladder and bile ducts. Pancreas: 1.3 cm stable cystic lesion in the distal body of pancreas. No other focal abnormality. Spleen: No mass enhancement.  No splenomegaly. Adrenals/Urinary Tract: There is no adrenal mass. There are right renal cysts without mass enhancement. There is no calculus or hydronephrosis. The bladder is chronically thickened likely due to prostatic enlargement with abutment. Stomach/Bowel: No bowel dilatation or wall thickening including the appendix. Stomach is distended with food products as before. Moderate fecal stasis. Uncomplicated sigmoid diverticulosis. Vascular/Lymphatic: Aortic atherosclerosis. No enlarged abdominal or pelvic lymph nodes. Reproductive: Prostatomegaly prostate transverse axis 5.1 cm. TURP defect are again shown. Other: Trace posterior pelvic ascites is again noted. There is no free air, hemorrhage or abscess. Small umbilical fat hernia. Musculoskeletal: No destructive bone lesion is seen. Mild degenerative change lumbar spine. IMPRESSION: 1. Area of confluent left hilar and adjacent mediastinal soft tissue may be slightly larger today than last month but the posttreatment appearance of the left chest is otherwise not significantly changed. Follow-up as indicated. 2. Narrowing of left hilar bronchi extending through the abnormality is seen with again noted postobstructive or post XRT pneumonitis posteriorly in the left upper lobe and in the superior segment of the left lower lobe. This is also not grossly changed. 3. Unchanged left pleural effusion appears partially loculated in the chest base. Volume loss again shown. 4. No metastatic adenopathy or liver mass  seen in the abdomen. 5. Constipation and diverticulosis. 6. Prostatomegaly with chronically thickened bladder. 7. Stomach distended with food products and contrast, could be due to recent ingestion or impaired gastric emptying. 8. Slight increased pericardial fluid. Trace chronic pelvic ascites. Electronically Signed   By: Telford Nab M.D.   On: 07/30/2021 00:11   CT CHEST ABDOMEN PELVIS W CONTRAST  Result Date: 07/03/2021 CLINICAL DATA:  Left lower lobe lung cancer restaging, status post radiation and chemotherapy, former smoker * Tracking Code: BO * EXAM: CT CHEST, ABDOMEN, AND PELVIS WITH CONTRAST TECHNIQUE: Multidetector CT imaging of the chest, abdomen and pelvis was performed following the standard protocol during bolus administration of intravenous contrast. RADIATION DOSE REDUCTION: This exam was performed according to the departmental dose-optimization program which includes automated exposure control, adjustment of the mA and/or kV according to patient size and/or use of iterative reconstruction technique. CONTRAST:  142mL OMNIPAQUE IOHEXOL 300 MG/ML SOLN, additional oral enteric contrast COMPARISON:  04/12/2021 FINDINGS: CT CHEST FINDINGS Cardiovascular: Right chest port catheter. Scattered aortic atherosclerosis. Cardiomegaly. Unchanged trace pericardial effusion. Mediastinum/Nodes: Unchanged matted appearing post treatment soft tissue of the mediastinum and left hilum (series 8, image 22, 30). No discretely enlarged mediastinal, hilar, or axillary lymph nodes. Thyroid gland, trachea, and esophagus demonstrate no significant findings. Lungs/Pleura: No significant change in post treatment appearance of the left chest, with dense perihilar and suprahilar fibrosis and consolidation as well as some postobstructive airspace disease of the posterior left upper lobe (series 2, image 61, 70). Unchanged small left pleural effusion without evident pleural thickening or nodularity. Mild underlying  centrilobular emphysema. Minimal scarring or atelectasis of the right lung base, unchanged. Musculoskeletal: No chest wall mass or suspicious osseous lesions identified. CT ABDOMEN PELVIS FINDINGS Hepatobiliary: No solid liver abnormality is seen. No gallstones, gallbladder wall  thickening, or biliary dilatation. Pancreas: Unchanged cystic lesion of the ventral pancreatic body measuring 1.4 x 1.2 cm (series 8, image 63). No pancreatic ductal dilatation or surrounding inflammatory changes. Spleen: Normal in size without significant abnormality. Adrenals/Urinary Tract: Adrenal glands are unremarkable. Simple, benign right renal cortical cysts, for which no further follow-up or characterization is required. Kidneys are otherwise normal, without renal calculi, solid lesion, or hydronephrosis. Bladder is unremarkable. Stomach/Bowel: Stomach is within normal limits. Appendix is not clearly visualized. No evidence of bowel wall thickening, distention, or inflammatory changes. Large burden of stool throughout the colon. Vascular/Lymphatic: Aortic atherosclerosis. No enlarged abdominal or pelvic lymph nodes. Reproductive: Prostatomegaly with TURP defect. Other: No abdominal wall hernia or abnormality. Unchanged small volume free fluid in the low pelvis (series 8, image 107). Musculoskeletal: No acute osseous findings. IMPRESSION: 1. No significant change in post treatment/post radiation appearance of the left chest, with dense perihilar and suprahilar fibrosis and consolidation as well as some postobstructive airspace disease of the posterior left upper lobe. 2. Unchanged small left pleural effusion without evident pleural thickening or nodularity. Findings are consistent with post treatment change of the left chest. No evidence of recurrent or metastatic disease. 3. Unchanged matted appearing post treatment/post radiation soft tissue of the mediastinum and left hilum. No discretely enlarged mediastinal, hilar, or axillary  lymph nodes. 4. No evidence of lymphadenopathy or metastatic disease in the abdomen or pelvis. 5. Unchanged cystic lesion of the ventral pancreatic body measuring 1.4 x 1.2 cm. 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 Attention on oncologic follow-up; no specific further follow-up or characterization is required. 6. Prostatomegaly. 7. Unchanged nonspecific small volume free fluid in the low pelvis. Aortic Atherosclerosis (ICD10-I70.0) and Emphysema (ICD10-J43.9). Electronically Signed   By: Delanna Ahmadi M.D.   On: 07/03/2021 20:14     Assessment and plan- Patient is a 71 y.o. male with recurrent [name] adenocarcinoma of the lung on maintenance Alimta here for on treatment assessment prior to next cycle  I have reviewed CT chest abdomen and pelvis images independently and discussed findings with the patient which overall shows stablePostradiation changes in the hilar and the left chest.  No evidence of distant metastatic disease.  Plan is to continue maintenance Alimta until progression or toxicity.  His hemoglobin presently is stable around 9 and has been this way at least dating back to October 2022.  If it starts drifting down further I will consider stopping Alimta at that time.  Patient gets B12 injections every other cycle and will get it with his next cycle.  I will see him back in 3 weeks for cycle 8   Visit Diagnosis 1. Goals of care, counseling/discussion   2. Encounter for antineoplastic chemotherapy   3. Malignant neoplasm of lower lobe of left lung ([place])      Dr. Randa Evens, MD, MPH [facility] [phone] 07/30/2021 4:37 PM

## 2021-07-30 NOTE — Progress Notes (Signed)
Pt states that sometimes after tx he gets very itchy at nighttime at times uses vaseline and seems to help some.

## 2021-07-30 NOTE — Patient Instructions (Signed)
[facility] CANCER CTR AT [place]  Discharge Instructions: Thank you for choosing [facility] to provide your oncology and hematology care.  If you have a lab appointment with the [facility], please go directly to the [facility] and check in at the registration area.  Wear comfortable clothing and clothing appropriate for easy access to any Portacath or PICC line.   We strive to give you quality time with your provider. You may need to reschedule your appointment if you arrive late (15 or more minutes).  Arriving late affects you and other patients whose appointments are after yours.  Also, if you miss three or more appointments without notifying the office, you may be dismissed from the clinic at the provider's discretion.      For prescription refill requests, have your pharmacy contact our office and allow 72 hours for refills to be completed.    Today you received the following chemotherapy and/or immunotherapy agents ALMITA      To help prevent nausea and vomiting after your treatment, we encourage you to take your nausea medication as directed.  BELOW ARE SYMPTOMS THAT SHOULD BE REPORTED IMMEDIATELY: *FEVER GREATER THAN 100.4 F (38 C) OR HIGHER *CHILLS OR SWEATING *NAUSEA AND VOMITING THAT IS NOT CONTROLLED WITH YOUR NAUSEA MEDICATION *UNUSUAL SHORTNESS OF BREATH *UNUSUAL BRUISING OR BLEEDING *URINARY PROBLEMS (pain or burning when urinating, or frequent urination) *BOWEL PROBLEMS (unusual diarrhea, constipation, pain near the anus) TENDERNESS IN MOUTH AND THROAT WITH OR WITHOUT PRESENCE OF ULCERS (sore throat, sores in mouth, or a toothache) UNUSUAL RASH, SWELLING OR PAIN  UNUSUAL VAGINAL DISCHARGE OR ITCHING   Items with * indicate a potential emergency and should be followed up as soon as possible or go to the Emergency Department if any problems should occur.  Please show the CHEMOTHERAPY ALERT CARD or IMMUNOTHERAPY ALERT CARD at check-in to the  Emergency Department and triage nurse.  Should you have questions after your visit or need to cancel or reschedule your appointment, please contact [facility] CANCER [place] AT [place]  [phone] and follow the prompts.  Office hours are 8:00 a.m. to 4:30 p.m. Monday - Friday. Please note that voicemails left after 4:00 p.m. may not be returned until the following business day.  We are closed weekends and major holidays. You have access to a nurse at all times for urgent questions. Please call the main number to the clinic [phone] and follow the prompts.  For any non-urgent questions, you may also contact your provider using MyChart. We now offer e-Visits for anyone 36 and older to request care online for non-urgent symptoms. For details visit [email or web address].   Also download the MyChart app! Go to the app store, search "MyChart", open the app, select [facility], and log in with your MyChart username and password.  Masks are optional in the cancer centers. If you would like for your care team to wear a mask while they are taking care of you, please let them know. For doctor visits, patients may have with them one support person who is at least 72 years old. At this time, visitors are not allowed in the infusion area.  Pemetrexed injection What is this medication? PEMETREXED (PEM e TREX ed) is a chemotherapy drug used to treat lung cancers like non-small cell lung cancer and mesothelioma. It may also be used to treat other cancers. This medicine may be used for other purposes; ask your health care provider or pharmacist if you have questions. COMMON  BRAND NAME(S): Alimta, PEMFEXY What should I tell my care team before I take this medication? They need to know if you have any of these conditions: infection (especially a virus infection such as chickenpox, cold sores, or herpes) kidney disease low blood counts, like low white cell, platelet, or red cell counts lung or  breathing disease, like asthma radiation therapy an unusual or allergic reaction to pemetrexed, other medicines, foods, dyes, or preservative pregnant or trying to get pregnant breast-feeding How should I use this medication? This drug is given as an infusion into a vein. It is administered in a hospital or clinic by a specially trained health care professional. Talk to your pediatrician regarding the use of this medicine in children. Special care may be needed. Overdosage: If you think you have taken too much of this medicine contact a poison control center or emergency room at once. NOTE: This medicine is only for you. Do not share this medicine with others. What if I miss a dose? It is important not to miss your dose. Call your doctor or health care professional if you are unable to keep an appointment. What may interact with this medication? This medicine may interact with the following medications: Ibuprofen This list may not describe all possible interactions. Give your health care provider a list of all the medicines, herbs, non-prescription drugs, or dietary supplements you use. Also tell them if you smoke, drink alcohol, or use illegal drugs. Some items may interact with your medicine. What should I watch for while using this medication? Visit your doctor for checks on your progress. This drug may make you feel generally unwell. This is not uncommon, as chemotherapy can affect healthy cells as well as cancer cells. Report any side effects. Continue your course of treatment even though you feel ill unless your doctor tells you to stop. In some cases, you may be given additional medicines to help with side effects. Follow all directions for their use. Call your doctor or health care professional for advice if you get a fever, chills or sore throat, or other symptoms of a cold or flu. Do not treat yourself. This drug decreases your body's ability to fight infections. Try to avoid being  around people who are sick. This medicine may increase your risk to bruise or bleed. Call your doctor or health care professional if you notice any unusual bleeding. Be careful brushing and flossing your teeth or using a toothpick because you may get an infection or bleed more easily. If you have any dental work done, tell your dentist you are receiving this medicine. Avoid taking products that contain aspirin, acetaminophen, ibuprofen, naproxen, or ketoprofen unless instructed by your doctor. These medicines may hide a fever. Call your doctor or health care professional if you get diarrhea or mouth sores. Do not treat yourself. To protect your kidneys, drink water or other fluids as directed while you are taking this medicine. Do not become pregnant while taking this medicine or for 6 months after stopping it. Women should inform their doctor if they wish to become pregnant or think they might be pregnant. Men should not father a child while taking this medicine and for 3 months after stopping it. This may interfere with the ability to father a child. You should talk to your doctor or health care professional if you are concerned about your fertility. There is a potential for serious side effects to an unborn child. Talk to your health care professional or pharmacist for more  information. Do not breast-feed an infant while taking this medicine or for 1 week after stopping it. What side effects may I notice from receiving this medication? Side effects that you should report to your doctor or health care professional as soon as possible: allergic reactions like skin rash, itching or hives, swelling of the face, lips, or tongue breathing problems redness, blistering, peeling or loosening of the skin, including inside the mouth signs and symptoms of bleeding such as bloody or black, tarry stools; red or dark-brown urine; spitting up blood or brown material that looks like coffee grounds; red spots on the  skin; unusual bruising or bleeding from the eye, gums, or nose signs and symptoms of infection like fever or chills; cough; sore throat; pain or trouble passing urine signs and symptoms of kidney injury like trouble passing urine or change in the amount of urine signs and symptoms of liver injury like dark yellow or brown urine; general ill feeling or flu-like symptoms; light-colored stools; loss of appetite; nausea; right upper belly pain; unusually weak or tired; yellowing of the eyes or skin Side effects that usually do not require medical attention (report to your doctor or health care professional if they continue or are bothersome): constipation mouth sores nausea, vomiting unusually weak or tired This list may not describe all possible side effects. Call your doctor for medical advice about side effects. You may report side effects to FDA at 1-800-FDA-1088. Where should I keep my medication? This drug is given in a hospital or clinic and will not be stored at home. NOTE: This sheet is a summary. It may not cover all possible information. If you have questions about this medicine, talk to your doctor, pharmacist, or health care provider.  2023 Elsevier/Gold Standard (2017-03-28 00:00:00)

## 2021-08-04 ENCOUNTER — Other Ambulatory Visit (HOSPITAL_COMMUNITY): Payer: Self-pay

## 2021-08-04 ENCOUNTER — Encounter: Payer: Self-pay | Admitting: Oncology

## 2021-08-04 ENCOUNTER — Telehealth: Payer: Self-pay | Admitting: *Deleted

## 2021-08-04 ENCOUNTER — Other Ambulatory Visit: Payer: Self-pay | Admitting: Nurse Practitioner

## 2021-08-04 MED ORDER — BREZTRI AEROSPHERE 160-9-4.8 MCG/ACT IN AERO: 2.0000 | INHALATION_SPRAY | Freq: Two times a day (BID) | RESPIRATORY_TRACT | 5 refills | Status: DC

## 2021-08-04 NOTE — Telephone Encounter (Signed)
Pharmacy called asking for alternative inhaler to be ordered because his insurance will not cover the [employer]

## 2021-08-20 ENCOUNTER — Inpatient Hospital Stay: Payer: Medicare HMO

## 2021-08-20 ENCOUNTER — Inpatient Hospital Stay (HOSPITAL_BASED_OUTPATIENT_CLINIC_OR_DEPARTMENT_OTHER): Payer: Medicare HMO | Admitting: Oncology

## 2021-08-20 ENCOUNTER — Inpatient Hospital Stay: Payer: Medicare HMO | Attending: Radiation Oncology

## 2021-08-20 ENCOUNTER — Encounter: Payer: Self-pay | Admitting: Oncology

## 2021-08-20 VITALS — BP 135/78 | HR 78 | Temp 97.7°F | Resp 16 | Ht 70.0 in | Wt 172.0 lb

## 2021-08-20 DIAGNOSIS — C3432 Malignant neoplasm of lower lobe, left bronchus or lung: Secondary | ICD-10-CM | POA: Diagnosis not present

## 2021-08-20 DIAGNOSIS — Z87891 Personal history of nicotine dependence: Secondary | ICD-10-CM | POA: Diagnosis not present

## 2021-08-20 DIAGNOSIS — Z5111 Encounter for antineoplastic chemotherapy: Secondary | ICD-10-CM

## 2021-08-20 DIAGNOSIS — Z79899 Other long term (current) drug therapy: Secondary | ICD-10-CM | POA: Diagnosis not present

## 2021-08-20 DIAGNOSIS — E785 Hyperlipidemia, unspecified: Secondary | ICD-10-CM | POA: Diagnosis not present

## 2021-08-20 DIAGNOSIS — R918 Other nonspecific abnormal finding of lung field: Secondary | ICD-10-CM | POA: Diagnosis not present

## 2021-08-20 DIAGNOSIS — Z923 Personal history of irradiation: Secondary | ICD-10-CM | POA: Insufficient documentation

## 2021-08-20 DIAGNOSIS — R5383 Other fatigue: Secondary | ICD-10-CM | POA: Diagnosis not present

## 2021-08-20 DIAGNOSIS — Z7951 Long term (current) use of inhaled steroids: Secondary | ICD-10-CM | POA: Insufficient documentation

## 2021-08-20 DIAGNOSIS — I10 Essential (primary) hypertension: Secondary | ICD-10-CM | POA: Diagnosis not present

## 2021-08-20 DIAGNOSIS — E538 Deficiency of other specified B group vitamins: Secondary | ICD-10-CM

## 2021-08-20 LAB — COMPREHENSIVE METABOLIC PANEL
ALT: 6 U/L (ref 0–44)
AST: 20 U/L (ref 15–41)
Albumin: 3.1 g/dL — ABNORMAL LOW (ref 3.5–5.0)
Alkaline Phosphatase: 91 U/L (ref 38–126)
Anion gap: 7 (ref 5–15)
BUN: 14 mg/dL (ref 8–23)
CO2: 24 mmol/L (ref 22–32)
Calcium: 8.4 mg/dL — ABNORMAL LOW (ref 8.9–10.3)
Chloride: 107 mmol/L (ref 98–111)
Creatinine, Ser: 0.92 mg/dL (ref 0.61–1.24)
GFR, Estimated: >60 mL/min (ref >60)
Glucose, Bld: 88 mg/dL (ref 70–99)
Potassium: 3.5 mmol/L (ref 3.5–5.1)
Sodium: 138 mmol/L (ref 135–145)
Total Bilirubin: 0.6 mg/dL (ref 0.3–1.2)
Total Protein: 7.2 g/dL (ref 6.5–8.1)

## 2021-08-20 LAB — CBC WITH DIFFERENTIAL/PLATELET
Abs Immature Granulocytes: 0.02 10*3/uL (ref 0.00–0.07)
Basophils Absolute: 0 10*3/uL (ref 0.0–0.1)
Basophils Relative: 0 %
Eosinophils Absolute: 0 10*3/uL (ref 0.0–0.5)
Eosinophils Relative: 0 %
HCT: 31.3 % — ABNORMAL LOW (ref 39.0–52.0)
Hemoglobin: 9.5 g/dL — ABNORMAL LOW (ref 13.0–17.0)
Immature Granulocytes: 0 %
Lymphocytes Relative: 20 %
Lymphs Abs: 1.1 10*3/uL (ref 0.7–4.0)
MCH: 26.1 pg (ref 26.0–34.0)
MCHC: 30.4 g/dL (ref 30.0–36.0)
MCV: 86 fL (ref 80.0–100.0)
Monocytes Absolute: 0.8 10*3/uL (ref 0.1–1.0)
Monocytes Relative: 15 %
Neutro Abs: 3.5 10*3/uL (ref 1.7–7.7)
Neutrophils Relative %: 65 %
Platelets: 442 10*3/uL — ABNORMAL HIGH (ref 150–400)
RBC: 3.64 MIL/uL — ABNORMAL LOW (ref 4.22–5.81)
RDW: 18.9 % — ABNORMAL HIGH (ref 11.5–15.5)
WBC: 5.5 10*3/uL (ref 4.0–10.5)
nRBC: 0 % (ref 0.0–0.2)

## 2021-08-20 MED ORDER — HEPARIN SOD (PORK) LOCK FLUSH 100 UNIT/ML IV SOLN
500.0000 [IU] | Freq: Once | INTRAVENOUS | Status: AC | PRN
  Filled 2021-08-20: qty 5

## 2021-08-20 MED ORDER — SODIUM CHLORIDE 0.9 % IV SOLN
Freq: Once | INTRAVENOUS | Status: AC
  Filled 2021-08-20: qty 250

## 2021-08-20 MED ORDER — HEPARIN SOD (PORK) LOCK FLUSH 100 UNIT/ML IV SOLN
INTRAVENOUS | Status: AC
  Administered 2021-08-20: 500 [IU]
  Filled 2021-08-20: qty 5

## 2021-08-20 MED ORDER — SODIUM CHLORIDE 0.9 % IV SOLN
500.0000 mg/m2 | Freq: Once | INTRAVENOUS | Status: AC
  Administered 2021-08-20: 1000 mg via INTRAVENOUS
  Filled 2021-08-20: qty 40

## 2021-08-20 MED ORDER — SODIUM CHLORIDE 0.9 % IV SOLN
10.0000 mg | Freq: Once | INTRAVENOUS | Status: AC
  Administered 2021-08-20: 10 mg via INTRAVENOUS
  Filled 2021-08-20: qty 1

## 2021-08-20 MED ORDER — CYANOCOBALAMIN 1000 MCG/ML IJ SOLN
1000.0000 ug | INTRAMUSCULAR | Status: DC
  Administered 2021-08-20: 1000 ug via INTRAMUSCULAR
  Filled 2021-08-20: qty 1

## 2021-08-20 NOTE — Patient Instructions (Signed)
[facility] AT [facility]  Discharge Instructions: Thank you for choosing [facility] to provide your oncology and hematology care.  If you have a lab appointment with the Cancer Center, please go directly to the Cancer Center and check in at the registration area.  Wear comfortable clothing and clothing appropriate for easy access to any Portacath or PICC line.   We strive to give you quality time with your provider. You may need to reschedule your appointment if you arrive late (15 or more minutes).  Arriving late affects you and other patients whose appointments are after yours.  Also, if you miss three or more appointments without notifying the office, you may be dismissed from the clinic at the provider's discretion.      For prescription refill requests, have your pharmacy contact our office and allow 72 hours for refills to be completed.    Today you received the following chemotherapy and/or immunotherapy agents: Alimta      To help prevent nausea and vomiting after your treatment, we encourage you to take your nausea medication as directed.  BELOW ARE SYMPTOMS THAT SHOULD BE REPORTED IMMEDIATELY: *FEVER GREATER THAN 100.4 F (38 C) OR HIGHER *CHILLS OR SWEATING *NAUSEA AND VOMITING THAT IS NOT CONTROLLED WITH YOUR NAUSEA MEDICATION *UNUSUAL SHORTNESS OF BREATH *UNUSUAL BRUISING OR BLEEDING *URINARY PROBLEMS (pain or burning when urinating, or frequent urination) *BOWEL PROBLEMS (unusual diarrhea, constipation, pain near the anus) TENDERNESS IN MOUTH AND THROAT WITH OR WITHOUT PRESENCE OF ULCERS (sore throat, sores in mouth, or a toothache) UNUSUAL RASH, SWELLING OR PAIN  UNUSUAL VAGINAL DISCHARGE OR ITCHING   Items with * indicate a potential emergency and should be followed up as soon as possible or go to the Emergency Department if any problems should occur.  Please show the CHEMOTHERAPY ALERT CARD or IMMUNOTHERAPY ALERT CARD at check-in to the  Emergency Department and triage nurse.  Should you have questions after your visit or need to cancel or reschedule your appointment, please contact [facility]  [phone] and follow the prompts.  Office hours are 8:00 a.m. to 4:30 p.m. Monday - Friday. Please note that voicemails left after 4:00 p.m. may not be returned until the following business day.  We are closed weekends and major holidays. You have access to a nurse at all times for urgent questions. Please call the main number to the clinic [phone] and follow the prompts.  For any non-urgent questions, you may also contact your provider using MyChart. We now offer e-Visits for anyone 18 and older to request care online for non-urgent symptoms. For details visit [email or web address].   Also download the MyChart app! Go to the app store, search "MyChart", open the app, select [facility], and log in with your MyChart username and password.  Masks are optional in the cancer centers. If you would like for your care team to wear a mask while they are taking care of you, please let them know. For doctor visits, patients may have with them one support person who is at least 72 years old. At this time, visitors are not allowed in the infusion area.   

## 2021-08-22 ENCOUNTER — Encounter: Payer: Self-pay | Admitting: Oncology

## 2021-08-22 NOTE — Progress Notes (Signed)
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  He underwent CT angios chest which did not show any PE.  Soft tissue attenuation measuring 2.9 x 4 cm centered in the left hilum resulting in abrupt angulation and narrowing of the pulmonary arteries and the central left lower lobe airways.  Additional ipsilateral hilar and subcarinal adenopathy.  No contralateral adenopathy.  Consolidative masslike opacity 3.6 x 3.1 cm in size and contiguous with more central perihilar soft tissue attenuation.  Overall findings concerning for primary bronchogenic carcinoma.    Patient lives with his sister who is his main caregiver.  He does not drive but is independent of his ADLs.  Reports ongoing fatigue and occasional retrosternal chest pain.  He has been evaluated by GI in the past for reflux as well.  Appetie is fair and weight is stable.  Denies any significant shortness of breath at this time.  Patient is an ex-smoker and smoked for about 20 years but quit smoking back in 1994   PET CT scan showed hypermetabolic mass in the left lower lobe with mediastinal adenopathy and right paratracheal adenopathy.  No evidence of distant metastatic disease.  Pathology was consistent with non-small cell lung cancer favor adenocarcinoma.  Cells were positive for TTF-1 and negative for p40.    Patient started concurrent carbotaxol radiation and then had allergic reaction to Taxol for which she was switched to Abraxane.  Given the short supply of Abraxane patient received carbo Alimta midway through his concurrent chemoradiation.  Scans post chemoradiation showed partial response and patient will be started on maintenance durvalumab on 01/14/2020.  Patient was on maintenance durvalumab and received 15 cycles up until August 2022.  He was noted to have worsening chest pain and was in the ER multiple times in September 2022.  Repeat CT scan showed concern for hilar recurrence.  Patient received palliative radiation to the growing left upper lobe lung massWas given carbo Alimta for 4 cycles and presently on maintenance Alimta  Interval history-patient is doing well overall.  No recent falls.  Denies any chest wall pain.  ECOG PS- 1 Pain scale- 0   Review of systems- Review of Systems  Constitutional:  Negative for chills, fever, malaise/fatigue and weight loss.  HENT:  Negative for congestion, ear discharge and nosebleeds.   Eyes:  Negative for blurred vision.  Respiratory:  Negative for cough, hemoptysis, sputum production, shortness of breath and wheezing.   Cardiovascular:  Negative for chest pain, palpitations, orthopnea and claudication.  Gastrointestinal:  Negative for abdominal pain, blood in stool, constipation, diarrhea, heartburn, melena, nausea and vomiting.  Genitourinary:  Negative for dysuria, flank pain, frequency, hematuria and urgency.  Musculoskeletal:  Negative for back pain, joint pain and myalgias.  Skin:  Negative for rash.  Neurological:  Negative for dizziness, tingling, focal weakness, seizures, weakness and headaches.  Endo/Heme/Allergies:  Does not bruise/bleed easily.  Psychiatric/Behavioral:  Negative for  depression and suicidal ideas. The patient does not have insomnia.       Allergies  Allergen Reactions   Taxol [Paclitaxel] Other (See Comments)     Chest pain, hip pain, coughing , wheezing     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 ([place])      Past Surgical History:  Procedure Laterality Date   ESOPHAGOGASTRODUODENOSCOPY (EGD) WITH PROPOFOL N/A 12/01/2020   Procedure: ESOPHAGOGASTRODUODENOSCOPY (EGD) WITH PROPOFOL;  Surgeon: Lin Landsman, MD;  Location: [facility] ENDOS[facility];  Service: Gastroenterology;  Laterality: N/A;   PORTA CATH INSERTION N/A 10/22/2019   Procedure: PORTA CATH INSERTION;  Surgeon: Katha Cabal, MD;  Location: [place] [facility];  Service: Cardiovascular;  Laterality: N/A;   VIDEO BRONCHOSCOPY WITH ENDOBRONCHIAL ULTRASOUND N/A 09/25/2019   Procedure: VIDEO BRONCHOSCOPY WITH ENDOBRONCHIAL ULTRASOUND;  Surgeon: Tyler Pita, MD;  Location: [facility] ORS;  Service: Pulmonary;  Laterality: N/A;    Social History   Socioeconomic History   Marital status: Single    Spouse name: Not on file   Number of children: Not on file   Years of education: Not on file   Highest education level: Not on file  Occupational History   Not on file  Tobacco Use   Smoking status: Former    Packs/day: 1.00    Years: 20.00    Total pack years: 20.00    Types: Cigarettes    Quit date: 60    Years since quitting: 29.5   Smokeless tobacco: Never  Vaping Use   Vaping Use: Never used  Substance and Sexual Activity   Alcohol use: Not Currently    Comment: not drank any beer in 2 onths   Drug use: Never   Sexual activity: Not Currently  Other Topics Concern   Not on file  Social History Narrative   Not on file   Social Determinants of Health   Financial Resource Strain: Not on file  Food Insecurity: Not on file  Transportation Needs: Not on file  Physical Activity: Not on file  Stress: Not on file  Social Connections: Not on file  Intimate Partner Violence: Not on file    Family History  Problem Relation  Age of Onset   Cancer Brother      Current Outpatient Medications:    acetaminophen (TYLENOL) 500 MG tablet, Take 1,000 mg by mouth every 6 (six) hours as needed for mild pain, moderate pain or headache., Disp: , Rfl:    albuterol (VENTOLIN HFA) 108 (90 Base) MCG/ACT inhaler, Inhale 2 puffs into the lungs every 4 (four) hours as needed for wheezing or shortness of breath., Disp: 8 g, Rfl: 1   Budeson-Glycopyrrol-Formoterol (BREZTRI AEROSPHERE) 160-9-4.8 MCG/ACT AERO, Inhale 2 puffs into the lungs in the morning and at bedtime., Disp: 10.7 g, Rfl: 5   DULoxetine (CYMBALTA) 30 MG capsule, Take 1 capsule (30 mg total) by mouth daily., Disp: 30 capsule, Rfl: 3   ANORO ELLIPTA 62.5-25 MCG/ACT AEPB, INHALE 1 PUFF INTO THE LUNGS DAILY (Patient not taking: Reported on 07/09/2021), Disp: 60 each, Rfl: 2   clobetasol cream (TEMOVATE) 0.05 %, Apply topically. (Patient not taking: Reported on 05/07/2021), Disp: , Rfl:    DULoxetine (CYMBALTA) 60 MG capsule, Take 60 mg by mouth daily. (Patient not taking: Reported on 05/07/2021), Disp: , Rfl:    FLOMAX 0.4 MG CAPS capsule, Take 0.4  mg by mouth daily. (Patient not taking: Reported on 05/07/2021), Disp: , Rfl:    FLOVENT HFA 220 MCG/ACT inhaler, Inhale 2 puffs into the lungs 2 (two) times daily. (Patient not taking: Reported on 05/07/2021), Disp: , Rfl:    folic acid (FOLVITE) 1 MG tablet, Take 1 tablet (1 mg total) by mouth daily. (Patient not taking: Reported on 05/07/2021), Disp: 30 tablet, Rfl: 6   hydrOXYzine (ATARAX/VISTARIL) 25 MG tablet, Take 25 mg by mouth daily. (Patient not taking: Reported on 05/07/2021), Disp: , Rfl:    lidocaine (LIDODERM) 5 %, Place 1 patch onto the skin daily. Remove & Discard patch within 12 hours or as directed by MD (Patient not taking: Reported on 05/07/2021), Disp: 30 patch, Rfl: 0   lidocaine (XYLOCAINE) 2 % solution, , Disp: , Rfl:    lidocaine-prilocaine (EMLA) cream, Apply 1 application topically as needed. (Patient not taking:  Reported on 05/07/2021), Disp: 30 g, Rfl: 3   LORazepam (ATIVAN) 0.5 MG tablet, Take 1 tablet (0.5 mg total) by mouth every 8 (eight) hours as needed for anxiety. (Patient not taking: Reported on 05/07/2021), Disp: 60 tablet, Rfl: 0   MIRALAX 17 GM/SCOOP powder, Take 17 g by mouth daily. (Patient not taking: Reported on 05/07/2021), Disp: , Rfl:    morphine (MS CONTIN) 30 MG 12 hr tablet, Take 1 tablet (30 mg total) by mouth every 8 (eight) hours. (Patient not taking: Reported on 05/07/2021), Disp: 90 tablet, Rfl: 0   naloxegol oxalate (MOVANTIK) 25 MG TABS tablet, Take 1 tablet (25 mg total) by mouth daily. (Patient not taking: Reported on 05/07/2021), Disp: 30 tablet, Rfl: 2   naloxone (NARCAN) nasal spray 4 mg/0.1 mL, SPRAY 1 SPRAY INTO ONE NOSTRIL AS DIRECTED FOR OPIOID OVERDOSE (TURN PERSON ON SIDE AFTER DOSE. IF NO RESPONSE IN 2-3 MINUTES OR PERSON RESPONDS BUT RELAPSES, REPEAT USING A NEW SPRAY DEVICE AND SPRAY INTO THE OTHER NOSTRIL. CALL 911 AFTER USE.) * EMERGENCY USE ONLY * (Patient not taking: Reported on 05/07/2021), Disp: 1 each, Rfl: 0   nystatin (MYCOSTATIN) 100000 UNIT/ML suspension, Take by mouth. (Patient not taking: Reported on 05/07/2021), Disp: , Rfl:    Oxycodone HCl 20 MG TABS, Take 1 tablet (20 mg total) by mouth every 3 (three) hours as needed (for breakthrough pain). (Patient not taking: Reported on 05/07/2021), Disp: 120 tablet, Rfl: 0   pantoprazole (PROTONIX) 40 MG tablet, Take 1 tablet (40mg ) twice daily for two weeks and then take 1 tablet daily thereafter (Patient not taking: Reported on 05/07/2021), Disp: 60 tablet, Rfl: 3   potassium chloride SA (KLOR-CON) 20 MEQ tablet, TAKE 1 TABLET BY MOUTH TWICE DAILY (Patient not taking: Reported on 05/07/2021), Disp: 30 tablet, Rfl: 1   sucralfate (CARAFATE) 1 g tablet, Take 1 tablet (1 g total) by mouth 4 (four) times daily. (Patient not taking: Reported on 05/07/2021), Disp: 60 tablet, Rfl: 0  Physical exam:  Vitals:   08/20/21 1329   BP: 135/78  Pulse: 78  Resp: 16  Temp: 97.7 F (36.5 C)  TempSrc: Tympanic  SpO2: 97%  Weight: 172 lb (78 kg)  Height: 5\' 10"  (1.778 m)   Physical Exam Constitutional:      General: He is not in acute distress. Cardiovascular:     Rate and Rhythm: Normal rate and regular rhythm.     Heart sounds: Normal heart sounds.  Pulmonary:     Effort: Pulmonary effort is normal.     Breath sounds: Normal breath sounds.  Abdominal:  General: Bowel sounds are normal.     Palpations: Abdomen is soft.  Skin:    General: Skin is warm and dry.  Neurological:     Mental Status: He is alert and oriented to person, place, and time.         Latest Ref Rng & Units 08/20/2021    1:19 PM  CMP  Glucose 70 - 99 mg/dL 88   BUN 8 - 23 mg/dL 14   Creatinine 0.61 - 1.24 mg/dL 0.92   Sodium 135 - 145 mmol/L 138   Potassium 3.5 - 5.1 mmol/L 3.5   Chloride 98 - 111 mmol/L 107   CO2 22 - 32 mmol/L 24   Calcium 8.9 - 10.3 mg/dL 8.4   Total Protein 6.5 - 8.1 g/dL 7.2   Total Bilirubin 0.3 - 1.2 mg/dL 0.6   Alkaline Phos 38 - 126 U/L 91   AST 15 - 41 U/L 20   ALT 0 - 44 U/L 6       Latest Ref Rng & Units 08/20/2021    1:19 PM  CBC  WBC 4.0 - 10.5 K/uL 5.5   Hemoglobin 13.0 - 17.0 g/dL 9.5   Hematocrit 39.0 - 52.0 % 31.3   Platelets 150 - 400 K/uL 442     No images are attached to the encounter.  CT CHEST ABDOMEN PELVIS W CONTRAST  Result Date: 07/30/2021 CLINICAL DATA:  Lung cancer restaging. EXAM: CT CHEST, ABDOMEN, AND PELVIS WITH CONTRAST TECHNIQUE: Multidetector CT imaging of the chest, abdomen and pelvis was performed following the standard protocol during bolus administration of intravenous contrast. RADIATION DOSE REDUCTION: This exam was performed according to the departmental dose-optimization program which includes automated exposure control, adjustment of the mA and/or kV according to patient size and/or use of iterative reconstruction technique. CONTRAST:  146mL OMNIPAQUE IOHEXOL  300 MG/ML  SOLN COMPARISON:  Chest, abdomen and pelvis CTs with contrast 04/09/2021 and 07/02/2021 FINDINGS: CT CHEST FINDINGS Cardiovascular: There is pericardial effusion up to 1.8 cm in thickness to the right 1.1 cm anteriorly. The pericardial fluid slightly increased since the last CT. The heart is slightly enlarged. Pulmonary veins are decompressed. Pulmonary arteries are normal caliber and centrally clear. There is aortic atherosclerosis without aneurysm or dissection. Right chest port catheter again terminates in the upper right atrium. Mediastinum/Nodes: Confluent soft tissue attenuation in the left hilar region encompassing central bronchovascular structures continues to be seen with extension to adjacent portions of the mediastinum. Central bronchi again show narrowing through this area of soft tissue and there is again noted left-sided volume loss. This is difficult to measure but the largest portion of this is approximally 5.0 x 4.0 cm on 2:27, at a similar level on the last CT was 4.7 x 3.7 cm. There is no further suggestion of intrathoracic adenopathy. No thyroid or axillary mass is seen. No focal esophageal thickening. Lungs/Pleura: Small to moderate sized left pleural effusion partially loculated in the chest base continues to be seen. This is no larger than previously. No pleural thickening or nodularity is seen and no pneumothorax. Dense perihilar/suprahilar fibrotic change is again noted with patchy consolidation again in the posterior left upper lobe and superior segment of the left lower lobe which could be postobstructive or post XRT pneumonitis. This shows no worsening or improvement. Coarse linear scar-like markings in the anterior lower lung field are again shown. There is no right pleural effusion. There is mild centrilobular emphysema. Chronic ground-glass disease in the basal right lower  lobe segments is again noted and mild bronchiectasis. Musculoskeletal: Mild thoracic dextroscoliosis.  No destructive thoracic cage bone lesion. CT ABDOMEN PELVIS FINDINGS Hepatobiliary: No masses seen in the liver. Unremarkable gallbladder and bile ducts. Pancreas: 1.3 cm stable cystic lesion in the distal body of pancreas. No other focal abnormality. Spleen: No mass enhancement.  No splenomegaly. Adrenals/Urinary Tract: There is no adrenal mass. There are right renal cysts without mass enhancement. There is no calculus or hydronephrosis. The bladder is chronically thickened likely due to prostatic enlargement with abutment. Stomach/Bowel: No bowel dilatation or wall thickening including the appendix. Stomach is distended with food products as before. Moderate fecal stasis. Uncomplicated sigmoid diverticulosis. Vascular/Lymphatic: Aortic atherosclerosis. No enlarged abdominal or pelvic lymph nodes. Reproductive: Prostatomegaly prostate transverse axis 5.1 cm. TURP defect are again shown. Other: Trace posterior pelvic ascites is again noted. There is no free air, hemorrhage or abscess. Small umbilical fat hernia. Musculoskeletal: No destructive bone lesion is seen. Mild degenerative change lumbar spine. IMPRESSION: 1. Area of confluent left hilar and adjacent mediastinal soft tissue may be slightly larger today than last month but the posttreatment appearance of the left chest is otherwise not significantly changed. Follow-up as indicated. 2. Narrowing of left hilar bronchi extending through the abnormality is seen with again noted postobstructive or post XRT pneumonitis posteriorly in the left upper lobe and in the superior segment of the left lower lobe. This is also not grossly changed. 3. Unchanged left pleural effusion appears partially loculated in the chest base. Volume loss again shown. 4. No metastatic adenopathy or liver mass seen in the abdomen. 5. Constipation and diverticulosis. 6. Prostatomegaly with chronically thickened bladder. 7. Stomach distended with food products and contrast, could be due to  recent ingestion or impaired gastric emptying. 8. Slight increased pericardial fluid. Trace chronic pelvic ascites. Electronically Signed   By: Telford Nab M.D.   On: 07/30/2021 00:11     Assessment and plan- Patient is a 71 y.o. male with recurrent [name] adenocarcinoma of the lung.  He is here for on treatment assessment prior to next cycle of maintenance Alimta  Counts okay to proceed with cycle 13 of Alimta today.  He will also receive his B12 injection today.  I will see him back in 3 weeks for cycle 14.  His recent CT scan from June 2023 was reviewed at tumor board and again there is no convincing evidence of recurrence and changes in the left hilar region likely secondary to radiation changes   Visit Diagnosis 1. Encounter for antineoplastic chemotherapy   2. Malignant neoplasm of lower lobe of left lung ([place])   3. B12 deficiency      Dr. Randa Evens, MD, MPH [facility] [phone] 08/22/2021 10:17 PM

## 2021-08-23 ENCOUNTER — Telehealth: Payer: Self-pay | Admitting: *Deleted

## 2021-08-23 NOTE — Telephone Encounter (Signed)
Patient called with questions regarding letter he got in the mail for $1000.00 benefits. Patients questions forwarded to Ulice Dash.

## 2021-09-09 MED FILL — Dexamethasone Sodium Phosphate Inj 100 MG/10ML: INTRAMUSCULAR | Qty: 1 | Status: AC

## 2021-09-10 ENCOUNTER — Inpatient Hospital Stay: Payer: Medicare HMO

## 2021-09-10 ENCOUNTER — Inpatient Hospital Stay (HOSPITAL_BASED_OUTPATIENT_CLINIC_OR_DEPARTMENT_OTHER): Payer: Medicare HMO | Admitting: Oncology

## 2021-09-10 ENCOUNTER — Encounter: Payer: Self-pay | Admitting: Oncology

## 2021-09-10 VITALS — BP 128/77 | HR 73 | Temp 97.7°F | Resp 18 | Wt 173.0 lb

## 2021-09-10 DIAGNOSIS — C3432 Malignant neoplasm of lower lobe, left bronchus or lung: Secondary | ICD-10-CM | POA: Diagnosis not present

## 2021-09-10 DIAGNOSIS — Z5111 Encounter for antineoplastic chemotherapy: Secondary | ICD-10-CM

## 2021-09-10 DIAGNOSIS — E538 Deficiency of other specified B group vitamins: Secondary | ICD-10-CM

## 2021-09-10 LAB — CBC WITH DIFFERENTIAL/PLATELET
Abs Immature Granulocytes: 0.02 10*3/uL (ref 0.00–0.07)
Basophils Absolute: 0 10*3/uL (ref 0.0–0.1)
Basophils Relative: 0 %
Eosinophils Absolute: 0 10*3/uL (ref 0.0–0.5)
Eosinophils Relative: 1 %
HCT: 30.9 % — ABNORMAL LOW (ref 39.0–52.0)
Hemoglobin: 9.4 g/dL — ABNORMAL LOW (ref 13.0–17.0)
Immature Granulocytes: 0 %
Lymphocytes Relative: 22 %
Lymphs Abs: 1.2 10*3/uL (ref 0.7–4.0)
MCH: 26.8 pg (ref 26.0–34.0)
MCHC: 30.4 g/dL (ref 30.0–36.0)
MCV: 88 fL (ref 80.0–100.0)
Monocytes Absolute: 0.7 10*3/uL (ref 0.1–1.0)
Monocytes Relative: 12 %
Neutro Abs: 3.6 10*3/uL (ref 1.7–7.7)
Neutrophils Relative %: 65 %
Platelets: 459 10*3/uL — ABNORMAL HIGH (ref 150–400)
RBC: 3.51 MIL/uL — ABNORMAL LOW (ref 4.22–5.81)
RDW: 19.9 % — ABNORMAL HIGH (ref 11.5–15.5)
WBC: 5.5 10*3/uL (ref 4.0–10.5)
nRBC: 0 % (ref 0.0–0.2)

## 2021-09-10 LAB — COMPREHENSIVE METABOLIC PANEL
ALT: 8 U/L (ref 0–44)
AST: 23 U/L (ref 15–41)
Albumin: 3.3 g/dL — ABNORMAL LOW (ref 3.5–5.0)
Alkaline Phosphatase: 93 U/L (ref 38–126)
Anion gap: 6 (ref 5–15)
BUN: 15 mg/dL (ref 8–23)
CO2: 23 mmol/L (ref 22–32)
Calcium: 8.8 mg/dL — ABNORMAL LOW (ref 8.9–10.3)
Chloride: 111 mmol/L (ref 98–111)
Creatinine, Ser: 0.97 mg/dL (ref 0.61–1.24)
GFR, Estimated: >60 mL/min (ref >60)
Glucose, Bld: 121 mg/dL — ABNORMAL HIGH (ref 70–99)
Potassium: 3.7 mmol/L (ref 3.5–5.1)
Sodium: 140 mmol/L (ref 135–145)
Total Bilirubin: 0.4 mg/dL (ref 0.3–1.2)
Total Protein: 7.2 g/dL (ref 6.5–8.1)

## 2021-09-10 MED ORDER — CYANOCOBALAMIN 1000 MCG/ML IJ SOLN
1000.0000 ug | INTRAMUSCULAR | Status: DC
  Administered 2021-09-10: 1000 ug via INTRAMUSCULAR
  Filled 2021-09-10: qty 1

## 2021-09-10 MED ORDER — SODIUM CHLORIDE 0.9 % IV SOLN
Freq: Once | INTRAVENOUS | Status: AC
  Filled 2021-09-10: qty 250

## 2021-09-10 MED ORDER — SODIUM CHLORIDE 0.9 % IV SOLN
500.0000 mg/m2 | Freq: Once | INTRAVENOUS | Status: AC
  Administered 2021-09-10: 1000 mg via INTRAVENOUS
  Filled 2021-09-10: qty 40

## 2021-09-10 MED ORDER — HEPARIN SOD (PORK) LOCK FLUSH 100 UNIT/ML IV SOLN
500.0000 [IU] | Freq: Once | INTRAVENOUS | Status: AC | PRN
  Administered 2021-09-10: 500 [IU]
  Filled 2021-09-10: qty 5

## 2021-09-10 MED ORDER — SODIUM CHLORIDE 0.9 % IV SOLN
10.0000 mg | Freq: Once | INTRAVENOUS | Status: AC
  Administered 2021-09-10: 10 mg via INTRAVENOUS
  Filled 2021-09-10: qty 10

## 2021-09-10 NOTE — Patient Instructions (Addendum)
[facility] AT [place]  Discharge Instructions: Thank you for choosing [facility] to provide your oncology and hematology care.  If you have a lab appointment with the [facility], please go directly to the [facility] and check in at the registration area.  Wear comfortable clothing and clothing appropriate for easy access to any Portacath or PICC line.   We strive to give you quality time with your provider. You may need to reschedule your appointment if you arrive late (15 or more minutes).  Arriving late affects you and other patients whose appointments are after yours.  Also, if you miss three or more appointments without notifying the office, you may be dismissed from the clinic at the provider's discretion.      For prescription refill requests, have your pharmacy contact our office and allow 72 hours for refills to be completed.    Today you received the following chemotherapy and/or immunotherapy agents ALIMTA and Vit B 12      To help prevent nausea and vomiting after your treatment, we encourage you to take your nausea medication as directed.  BELOW ARE SYMPTOMS THAT SHOULD BE REPORTED IMMEDIATELY: *FEVER GREATER THAN 100.4 F (38 C) OR HIGHER *CHILLS OR SWEATING *NAUSEA AND VOMITING THAT IS NOT CONTROLLED WITH YOUR NAUSEA MEDICATION *UNUSUAL SHORTNESS OF BREATH *UNUSUAL BRUISING OR BLEEDING *URINARY PROBLEMS (pain or burning when urinating, or frequent urination) *BOWEL PROBLEMS (unusual diarrhea, constipation, pain near the anus) TENDERNESS IN MOUTH AND THROAT WITH OR WITHOUT PRESENCE OF ULCERS (sore throat, sores in mouth, or a toothache) UNUSUAL RASH, SWELLING OR PAIN  UNUSUAL VAGINAL DISCHARGE OR ITCHING   Items with * indicate a potential emergency and should be followed up as soon as possible or go to the Emergency Department if any problems should occur.  Please show the CHEMOTHERAPY ALERT CARD or IMMUNOTHERAPY ALERT CARD at  check-in to the Emergency Department and triage nurse.  Should you have questions after your visit or need to cancel or reschedule your appointment, please contact [facility] [place] [facility] [place]  [phone] and follow the prompts.  Office hours are 8:00 a.m. to 4:30 p.m. Monday - Friday. Please note that voicemails left after 4:00 p.m. may not be returned until the following business day.  We are closed weekends and major holidays. You have access to a nurse at all times for urgent questions. Please call the main number to the clinic [phone] and follow the prompts.  For any non-urgent questions, you may also contact your provider using MyChart. We now offer e-Visits for anyone 42 and older to request care online for non-urgent symptoms. For details visit [email or web address].   Also download the MyChart app! Go to the app store, search "MyChart", open the app, select [facility], and log in with your MyChart username and password.  Masks are optional in the cancer centers. If you would like for your care team to wear a mask while they are taking care of you, please let them know. For doctor visits, patients may have with them one support person who is at least 72 years old. At this time, visitors are not allowed in the infusion area.  Pemetrexed injection What is this medication? PEMETREXED (PEM e TREX ed) is a chemotherapy drug used to treat lung cancers like non-small cell lung cancer and mesothelioma. It may also be used to treat other cancers. This medicine may be used for other purposes; ask your health care provider or pharmacist if  you have questions. COMMON BRAND NAME(S): Alimta, PEMFEXY What should I tell my care team before I take this medication? They need to know if you have any of these conditions: infection (especially a virus infection such as chickenpox, cold sores, or herpes) kidney disease low blood counts, like low white cell, platelet, or red cell  counts lung or breathing disease, like asthma radiation therapy an unusual or allergic reaction to pemetrexed, other medicines, foods, dyes, or preservative pregnant or trying to get pregnant breast-feeding How should I use this medication? This drug is given as an infusion into a vein. It is administered in a hospital or clinic by a specially trained health care professional. Talk to your pediatrician regarding the use of this medicine in children. Special care may be needed. Overdosage: If you think you have taken too much of this medicine contact a poison control center or emergency room at once. NOTE: This medicine is only for you. Do not share this medicine with others. What if I miss a dose? It is important not to miss your dose. Call your doctor or health care professional if you are unable to keep an appointment. What may interact with this medication? This medicine may interact with the following medications: Ibuprofen This list may not describe all possible interactions. Give your health care provider a list of all the medicines, herbs, non-prescription drugs, or dietary supplements you use. Also tell them if you smoke, drink alcohol, or use illegal drugs. Some items may interact with your medicine. What should I watch for while using this medication? Visit your doctor for checks on your progress. This drug may make you feel generally unwell. This is not uncommon, as chemotherapy can affect healthy cells as well as cancer cells. Report any side effects. Continue your course of treatment even though you feel ill unless your doctor tells you to stop. In some cases, you may be given additional medicines to help with side effects. Follow all directions for their use. Call your doctor or health care professional for advice if you get a fever, chills or sore throat, or other symptoms of a cold or flu. Do not treat yourself. This drug decreases your body's ability to fight infections. Try to  avoid being around people who are sick. This medicine may increase your risk to bruise or bleed. Call your doctor or health care professional if you notice any unusual bleeding. Be careful brushing and flossing your teeth or using a toothpick because you may get an infection or bleed more easily. If you have any dental work done, tell your dentist you are receiving this medicine. Avoid taking products that contain aspirin, acetaminophen, ibuprofen, naproxen, or ketoprofen unless instructed by your doctor. These medicines may hide a fever. Call your doctor or health care professional if you get diarrhea or mouth sores. Do not treat yourself. To protect your kidneys, drink water or other fluids as directed while you are taking this medicine. Do not become pregnant while taking this medicine or for 6 months after stopping it. Women should inform their doctor if they wish to become pregnant or think they might be pregnant. Men should not father a child while taking this medicine and for 3 months after stopping it. This may interfere with the ability to father a child. You should talk to your doctor or health care professional if you are concerned about your fertility. There is a potential for serious side effects to an unborn child. Talk to your health care professional  or pharmacist for more information. Do not breast-feed an infant while taking this medicine or for 1 week after stopping it. What side effects may I notice from receiving this medication? Side effects that you should report to your doctor or health care professional as soon as possible: allergic reactions like skin rash, itching or hives, swelling of the face, lips, or tongue breathing problems redness, blistering, peeling or loosening of the skin, including inside the mouth signs and symptoms of bleeding such as bloody or black, tarry stools; red or dark-brown urine; spitting up blood or brown material that looks like coffee grounds; red  spots on the skin; unusual bruising or bleeding from the eye, gums, or nose signs and symptoms of infection like fever or chills; cough; sore throat; pain or trouble passing urine signs and symptoms of kidney injury like trouble passing urine or change in the amount of urine signs and symptoms of liver injury like dark yellow or brown urine; general ill feeling or flu-like symptoms; light-colored stools; loss of appetite; nausea; right upper belly pain; unusually weak or tired; yellowing of the eyes or skin Side effects that usually do not require medical attention (report to your doctor or health care professional if they continue or are bothersome): constipation mouth sores nausea, vomiting unusually weak or tired This list may not describe all possible side effects. Call your doctor for medical advice about side effects. You may report side effects to FDA at 1-800-FDA-1088. Where should I keep my medication? This drug is given in a hospital or clinic and will not be stored at home. NOTE: This sheet is a summary. It may not cover all possible information. If you have questions about this medicine, talk to your doctor, pharmacist, or health care provider.  2023 Elsevier/Gold Standard (2017-03-28 00:00:00)   Vitamin B12 Injection What is this medication? Vitamin B12 (VAHY tuh min B12) prevents and treats low vitamin B12 levels in your body. It is used in people who do not get enough vitamin B12 from their diet or when their digestive tract does not absorb enough. Vitamin B12 plays an important role in maintaining the health of your nervous system and red blood cells. This medicine may be used for other purposes; ask your health care provider or pharmacist if you have questions. COMMON BRAND NAME(S): B-12 Compliance Kit, B-12 Injection Kit, Cyomin, Dodex, LA-12, Nutri-Twelve, Physicians EZ Use B-12, Primabalt What should I tell my care team before I take this medication? They need to know if  you have any of these conditions: Kidney disease Leber's disease Megaloblastic anemia An unusual or allergic reaction to cyanocobalamin, cobalt, other medications, foods, dyes, or preservatives Pregnant or trying to get pregnant Breast-feeding How should I use this medication? This medication is injected into a muscle or deeply under the skin. It is usually given in a clinic or care team's office. However, your care team may teach you how to inject yourself. Follow all instructions. Talk to your care team about the use of this medication in children. Special care may be needed. Overdosage: If you think you have taken too much of this medicine contact a poison control center or emergency room at once. NOTE: This medicine is only for you. Do not share this medicine with others. What if I miss a dose? If you are given your dose at a clinic or care team's office, call to reschedule your appointment. If you give your own injections, and you miss a dose, take it as soon as  you can. If it is almost time for your next dose, take only that dose. Do not take double or extra doses. What may interact with this medication? Alcohol Colchicine This list may not describe all possible interactions. Give your health care provider a list of all the medicines, herbs, non-prescription drugs, or dietary supplements you use. Also tell them if you smoke, drink alcohol, or use illegal drugs. Some items may interact with your medicine. What should I watch for while using this medication? Visit your care team regularly. You may need blood work done while you are taking this medication. You may need to follow a special diet. Talk to your care team. Limit your alcohol intake and avoid smoking to get the best benefit. What side effects may I notice from receiving this medication? Side effects that you should report to your care team as soon as possible: Allergic reactions--skin rash, itching, hives, swelling of the face,  lips, tongue, or throat Swelling of the ankles, hands, or feet Trouble breathing Side effects that usually do not require medical attention (report to your care team if they continue or are bothersome): Diarrhea This list may not describe all possible side effects. Call your doctor for medical advice about side effects. You may report side effects to FDA at 1-800-FDA-1088. Where should I keep my medication? Keep out of the reach of children. Store at room temperature between 15 and 30 degrees C (59 and 85 degrees F). Protect from light. Throw away any unused medication after the expiration date. NOTE: This sheet is a summary. It may not cover all possible information. If you have questions about this medicine, talk to your doctor, pharmacist, or health care provider.  2023 Elsevier/Gold Standard (2020-10-13 00:00:00)

## 2021-09-12 ENCOUNTER — Encounter: Payer: Self-pay | Admitting: Oncology

## 2021-09-12 NOTE — Progress Notes (Signed)
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  He underwent CT angios chest which did not show any PE.  Soft tissue attenuation measuring 2.9 x 4 cm centered in the left hilum resulting in abrupt angulation and narrowing of the pulmonary arteries and the central left lower lobe airways.  Additional ipsilateral hilar and subcarinal adenopathy.  No contralateral adenopathy.  Consolidative masslike opacity 3.6 x 3.1 cm in size and contiguous with more central perihilar soft tissue attenuation.  Overall findings concerning for primary bronchogenic carcinoma.    Patient lives with his sister who is his main caregiver.  He does not drive but is independent of his ADLs.  Reports ongoing fatigue and occasional retrosternal chest pain.  He has been evaluated by GI in the past for reflux as well.  Appetie is fair and weight is stable.  Denies any significant shortness of breath at this time.  Patient is an ex-smoker and smoked for about 20 years but quit smoking back in 1994   PET CT scan showed hypermetabolic mass in the left lower lobe with mediastinal adenopathy and right paratracheal adenopathy.  No evidence of distant metastatic disease.  Pathology was consistent with non-small cell lung cancer favor adenocarcinoma.  Cells were positive for TTF-1 and negative for p40.    Patient started concurrent carbotaxol radiation and then had allergic reaction to Taxol for which she was switched to Abraxane.  Given the short supply of Abraxane patient received carbo Alimta midway through his concurrent chemoradiation.  Scans post chemoradiation showed partial response and patient will be started on maintenance durvalumab on 01/14/2020.  Patient was on maintenance durvalumab and received 15 cycles up until August 2022.  He was noted to have worsening chest pain and was in the ER multiple times in September 2022.  Repeat CT scan showed concern for hilar recurrence.  Patient received palliative radiation to the growing left upper lobe lung massWas given carbo Alimta for 4 cycles and presently on maintenance Alimta  Interval history-patient is doing well overall and denies any specific complaints at this time.  Denies any cough shortness of breath or chest wall pain.  Appetite and weight have been stable.  Denies any recent falls  ECOG PS- 1 Pain scale- 0   Review of systems- Review of Systems  Constitutional:  Negative for chills, fever, malaise/fatigue and weight loss.  HENT:  Negative for congestion, ear discharge and nosebleeds.   Eyes:  Negative for blurred vision.  Respiratory:  Negative for cough, hemoptysis, sputum production, shortness of breath and wheezing.   Cardiovascular:  Negative for chest pain, palpitations, orthopnea and claudication.  Gastrointestinal:  Negative for abdominal pain, blood in stool, constipation, diarrhea, heartburn, melena, nausea and vomiting.  Genitourinary:  Negative for dysuria, flank pain, frequency, hematuria and urgency.  Musculoskeletal:  Negative for back pain, joint pain and myalgias.  Skin:  Negative for rash.  Neurological:  Negative for dizziness, tingling, focal weakness, seizures, weakness and headaches.  Endo/Heme/Allergies:  Does not bruise/bleed easily.  Psychiatric/Behavioral:  Negative for depression and suicidal ideas.  The patient does not have insomnia.       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 ([place])      Past Surgical History:  Procedure Laterality Date   ESOPHAGOGASTRODUODENOSCOPY (EGD) WITH PROPOFOL N/A 12/01/2020   Procedure: ESOPHAGOGASTRODUODENOSCOPY (EGD) WITH PROPOFOL;  Surgeon: Lin Landsman, MD;  Location: [facility] ENDOSCOPY;  Service: Gastroenterology;  Laterality: N/A;   PORTA CATH INSERTION N/A 10/22/2019   Procedure: PORTA CATH INSERTION;  Surgeon: Katha Cabal, MD;  Location: [place] [facility];  Service: Cardiovascular;  Laterality: N/A;   VIDEO BRONCHOSCOPY WITH ENDOBRONCHIAL ULTRASOUND N/A 09/25/2019   Procedure: VIDEO BRONCHOSCOPY WITH ENDOBRONCHIAL ULTRASOUND;  Surgeon: Tyler Pita, MD;  Location: [facility] ORS;  Service: Pulmonary;  Laterality: N/A;    Social History   Socioeconomic History   Marital status: Single    Spouse name: Not on file   Number of children: Not on file   Years of education: Not on file   Highest education level: Not on file  Occupational History   Not on file  Tobacco Use   Smoking status: Former    Packs/day: 1.00    Years: 20.00    Total pack years: 20.00    Types: Cigarettes    Quit date: 67    Years since quitting: 29.5   Smokeless tobacco: Never  Vaping Use   Vaping Use: Never used  Substance and Sexual Activity   Alcohol use: Not Currently    Comment: not drank any beer in 2 onths   Drug use: Never   Sexual activity: Not Currently  Other Topics Concern   Not on file  Social History Narrative   Not on file   Social Determinants of Health   Financial Resource Strain: Not on file  Food Insecurity: Not on file  Transportation Needs: Not on file  Physical Activity: Not on file  Stress:  Not on file  Social Connections: Not on file  Intimate Partner Violence: Not on file    Family History  Problem Relation Age of Onset   Cancer Brother      Current Outpatient Medications:    albuterol (VENTOLIN HFA) 108 (90 Base) MCG/ACT inhaler, Inhale 2 puffs into the lungs every 4 (four) hours as needed for wheezing or shortness of breath., Disp: 8 g, Rfl: 1   Budeson-Glycopyrrol-Formoterol (BREZTRI AEROSPHERE) 160-9-4.8 MCG/ACT AERO, Inhale 2 puffs into the lungs in the morning and at bedtime., Disp: 10.7 g, Rfl: 5   DULoxetine (CYMBALTA) 30 MG capsule, Take 1 capsule (30 mg total) by mouth daily., Disp: 30 capsule, Rfl: 3   acetaminophen (TYLENOL) 500 MG tablet, Take 1,000 mg by mouth every 6 (six) hours as needed for mild pain, moderate pain or headache. (Patient not taking: Reported on 09/10/2021), Disp: , Rfl:    ANORO ELLIPTA 62.5-25 MCG/ACT AEPB, INHALE 1 PUFF INTO THE LUNGS DAILY (Patient not taking: Reported on 07/09/2021), Disp: 60 each, Rfl: 2   clobetasol cream (TEMOVATE) 0.05 %, Apply topically. (Patient not taking: Reported on 09/10/2021), Disp: , Rfl:  DULoxetine (CYMBALTA) 60 MG capsule, Take 60 mg by mouth daily. (Patient not taking: Reported on 05/07/2021), Disp: , Rfl:    FLOMAX 0.4 MG CAPS capsule, Take 0.4 mg by mouth daily. (Patient not taking: Reported on 05/07/2021), Disp: , Rfl:    FLOVENT HFA 220 MCG/ACT inhaler, Inhale 2 puffs into the lungs 2 (two) times daily. (Patient not taking: Reported on 05/07/2021), Disp: , Rfl:    folic acid (FOLVITE) 1 MG tablet, Take 1 tablet (1 mg total) by mouth daily. (Patient not taking: Reported on 05/07/2021), Disp: 30 tablet, Rfl: 6   hydrOXYzine (ATARAX/VISTARIL) 25 MG tablet, Take 25 mg by mouth daily. (Patient not taking: Reported on 05/07/2021), Disp: , Rfl:    lidocaine (LIDODERM) 5 %, Place 1 patch onto the skin daily. Remove & Discard patch within 12 hours or as directed by MD (Patient not taking: Reported on 05/07/2021), Disp:  30 patch, Rfl: 0   lidocaine (XYLOCAINE) 2 % solution, , Disp: , Rfl:    lidocaine-prilocaine (EMLA) cream, Apply 1 application topically as needed. (Patient not taking: Reported on 05/07/2021), Disp: 30 g, Rfl: 3   LORazepam (ATIVAN) 0.5 MG tablet, Take 1 tablet (0.5 mg total) by mouth every 8 (eight) hours as needed for anxiety. (Patient not taking: Reported on 05/07/2021), Disp: 60 tablet, Rfl: 0   MIRALAX 17 GM/SCOOP powder, Take 17 g by mouth daily. (Patient not taking: Reported on 05/07/2021), Disp: , Rfl:    morphine (MS CONTIN) 30 MG 12 hr tablet, Take 1 tablet (30 mg total) by mouth every 8 (eight) hours. (Patient not taking: Reported on 05/07/2021), Disp: 90 tablet, Rfl: 0   naloxegol oxalate (MOVANTIK) 25 MG TABS tablet, Take 1 tablet (25 mg total) by mouth daily. (Patient not taking: Reported on 05/07/2021), Disp: 30 tablet, Rfl: 2   naloxone (NARCAN) nasal spray 4 mg/0.1 mL, SPRAY 1 SPRAY INTO ONE NOSTRIL AS DIRECTED FOR OPIOID OVERDOSE (TURN PERSON ON SIDE AFTER DOSE. IF NO RESPONSE IN 2-3 MINUTES OR PERSON RESPONDS BUT RELAPSES, REPEAT USING A NEW SPRAY DEVICE AND SPRAY INTO THE OTHER NOSTRIL. CALL 911 AFTER USE.) * EMERGENCY USE ONLY * (Patient not taking: Reported on 05/07/2021), Disp: 1 each, Rfl: 0   nystatin (MYCOSTATIN) 100000 UNIT/ML suspension, Take by mouth. (Patient not taking: Reported on 05/07/2021), Disp: , Rfl:    Oxycodone HCl 20 MG TABS, Take 1 tablet (20 mg total) by mouth every 3 (three) hours as needed (for breakthrough pain). (Patient not taking: Reported on 05/07/2021), Disp: 120 tablet, Rfl: 0   pantoprazole (PROTONIX) 40 MG tablet, Take 1 tablet (40mg ) twice daily for two weeks and then take 1 tablet daily thereafter (Patient not taking: Reported on 05/07/2021), Disp: 60 tablet, Rfl: 3   potassium chloride SA (KLOR-CON) 20 MEQ tablet, TAKE 1 TABLET BY MOUTH TWICE DAILY (Patient not taking: Reported on 05/07/2021), Disp: 30 tablet, Rfl: 1   sucralfate (CARAFATE) 1 g tablet,  Take 1 tablet (1 g total) by mouth 4 (four) times daily. (Patient not taking: Reported on 05/07/2021), Disp: 60 tablet, Rfl: 0  Physical exam:  Vitals:   09/10/21 1357  BP: 128/77  Pulse: 73  Resp: 18  Temp: 97.7 F (36.5 C)  SpO2: 100%  Weight: 173 lb (78.5 kg)   Physical Exam Constitutional:      General: He is not in acute distress. Cardiovascular:     Rate and Rhythm: Normal rate and regular rhythm.     Heart sounds: Normal heart sounds.  Pulmonary:     Effort: Pulmonary effort is normal.     Breath sounds: Normal breath sounds.  Skin:    General: Skin is warm and dry.  Neurological:     Mental Status: He is alert and oriented to person, place, and time.         Latest Ref Rng & Units 09/10/2021    1:13 PM  CMP  Glucose 70 - 99 mg/dL 121   BUN 8 - 23 mg/dL 15   Creatinine 0.61 - 1.24 mg/dL 0.97   Sodium 135 - 145 mmol/L 140   Potassium 3.5 - 5.1 mmol/L 3.7   Chloride 98 - 111 mmol/L 111   CO2 22 - 32 mmol/L 23   Calcium 8.9 - 10.3 mg/dL 8.8   Total Protein 6.5 - 8.1 g/dL 7.2   Total Bilirubin 0.3 - 1.2 mg/dL 0.4   Alkaline Phos 38 - 126 U/L 93   AST 15 - 41 U/L 23   ALT 0 - 44 U/L 8       Latest Ref Rng & Units 09/10/2021    1:13 PM  CBC  WBC 4.0 - 10.5 K/uL 5.5   Hemoglobin 13.0 - 17.0 g/dL 9.4   Hematocrit 39.0 - 52.0 % 30.9   Platelets 150 - 400 K/uL 459       Assessment and plan- Patient is a 71 y.o. male with recurrent [name] adenocarcinoma of the lung.    Patient is tolerating Alimta well without any significant side effects.  Counts otherwise okay to proceed with cycle 14 of maintenance Alimta today.  He will be seen by covering MD or NP in 3 weeks for cycle 15 and I will see him back in 6 weeks for cycle 16.  Patient will receive B12 injection in 3 weeks.  Plan to repeat scan sometime in September 2023   Visit Diagnosis 1. Encounter for antineoplastic chemotherapy   2. Malignant neoplasm of lower lobe of left lung ([place])      Dr.  Randa Evens, MD, MPH [facility] [phone] 09/12/2021 12:29 PM

## 2021-09-24 ENCOUNTER — Other Ambulatory Visit: Payer: Self-pay | Admitting: Oncology

## 2021-09-30 MED FILL — Dexamethasone Sodium Phosphate Inj 100 MG/10ML: INTRAMUSCULAR | Qty: 1 | Status: AC

## 2021-10-01 ENCOUNTER — Encounter: Payer: Self-pay | Admitting: Medical Oncology

## 2021-10-01 ENCOUNTER — Inpatient Hospital Stay: Payer: Medicare HMO

## 2021-10-01 ENCOUNTER — Inpatient Hospital Stay (HOSPITAL_BASED_OUTPATIENT_CLINIC_OR_DEPARTMENT_OTHER): Payer: Medicare HMO | Admitting: Medical Oncology

## 2021-10-01 ENCOUNTER — Inpatient Hospital Stay: Payer: Medicare HMO | Attending: Radiation Oncology

## 2021-10-01 VITALS — BP 139/91 | HR 73 | Temp 98.8°F | Resp 20 | Wt 172.3 lb

## 2021-10-01 DIAGNOSIS — E785 Hyperlipidemia, unspecified: Secondary | ICD-10-CM | POA: Insufficient documentation

## 2021-10-01 DIAGNOSIS — Z5111 Encounter for antineoplastic chemotherapy: Secondary | ICD-10-CM

## 2021-10-01 DIAGNOSIS — E538 Deficiency of other specified B group vitamins: Secondary | ICD-10-CM | POA: Diagnosis not present

## 2021-10-01 DIAGNOSIS — Z87891 Personal history of nicotine dependence: Secondary | ICD-10-CM | POA: Insufficient documentation

## 2021-10-01 DIAGNOSIS — I10 Essential (primary) hypertension: Secondary | ICD-10-CM | POA: Insufficient documentation

## 2021-10-01 DIAGNOSIS — C3432 Malignant neoplasm of lower lobe, left bronchus or lung: Secondary | ICD-10-CM

## 2021-10-01 DIAGNOSIS — C3491 Malignant neoplasm of unspecified part of right bronchus or lung: Secondary | ICD-10-CM | POA: Diagnosis not present

## 2021-10-01 LAB — CBC WITH DIFFERENTIAL/PLATELET
Abs Immature Granulocytes: 0.03 10*3/uL (ref 0.00–0.07)
Basophils Absolute: 0 10*3/uL (ref 0.0–0.1)
Basophils Relative: 0 %
Eosinophils Absolute: 0 10*3/uL (ref 0.0–0.5)
Eosinophils Relative: 1 %
HCT: 30.1 % — ABNORMAL LOW (ref 39.0–52.0)
Hemoglobin: 9.1 g/dL — ABNORMAL LOW (ref 13.0–17.0)
Immature Granulocytes: 1 %
Lymphocytes Relative: 24 %
Lymphs Abs: 1.2 10*3/uL (ref 0.7–4.0)
MCH: 26.5 pg (ref 26.0–34.0)
MCHC: 30.2 g/dL (ref 30.0–36.0)
MCV: 87.8 fL (ref 80.0–100.0)
Monocytes Absolute: 0.7 10*3/uL (ref 0.1–1.0)
Monocytes Relative: 13 %
Neutro Abs: 3.2 10*3/uL (ref 1.7–7.7)
Neutrophils Relative %: 61 %
Platelets: 485 10*3/uL — ABNORMAL HIGH (ref 150–400)
RBC: 3.43 MIL/uL — ABNORMAL LOW (ref 4.22–5.81)
RDW: 19.9 % — ABNORMAL HIGH (ref 11.5–15.5)
WBC: 5.2 10*3/uL (ref 4.0–10.5)
nRBC: 0 % (ref 0.0–0.2)

## 2021-10-01 LAB — COMPREHENSIVE METABOLIC PANEL
ALT: 8 U/L (ref 0–44)
AST: 22 U/L (ref 15–41)
Albumin: 3.2 g/dL — ABNORMAL LOW (ref 3.5–5.0)
Alkaline Phosphatase: 78 U/L (ref 38–126)
Anion gap: 6 (ref 5–15)
BUN: 11 mg/dL (ref 8–23)
CO2: 24 mmol/L (ref 22–32)
Calcium: 8.4 mg/dL — ABNORMAL LOW (ref 8.9–10.3)
Chloride: 107 mmol/L (ref 98–111)
Creatinine, Ser: 1.09 mg/dL (ref 0.61–1.24)
GFR, Estimated: >60 mL/min (ref >60)
Glucose, Bld: 160 mg/dL — ABNORMAL HIGH (ref 70–99)
Potassium: 3.6 mmol/L (ref 3.5–5.1)
Sodium: 137 mmol/L (ref 135–145)
Total Bilirubin: 0.4 mg/dL (ref 0.3–1.2)
Total Protein: 7.3 g/dL (ref 6.5–8.1)

## 2021-10-01 MED ORDER — SODIUM CHLORIDE 0.9% FLUSH
10.0000 mL | INTRAVENOUS | Status: DC | PRN
  Administered 2021-10-01: 10 mL
  Filled 2021-10-01: qty 10

## 2021-10-01 MED ORDER — CYANOCOBALAMIN 1000 MCG/ML IJ SOLN
1000.0000 ug | INTRAMUSCULAR | Status: DC
  Administered 2021-10-01: 1000 ug via INTRAMUSCULAR
  Filled 2021-10-01: qty 1

## 2021-10-01 MED ORDER — SODIUM CHLORIDE 0.9 % IV SOLN
500.0000 mg/m2 | Freq: Once | INTRAVENOUS | Status: AC
  Administered 2021-10-01: 1000 mg via INTRAVENOUS
  Filled 2021-10-01: qty 40

## 2021-10-01 MED ORDER — HEPARIN SOD (PORK) LOCK FLUSH 100 UNIT/ML IV SOLN
500.0000 [IU] | Freq: Once | INTRAVENOUS | Status: AC | PRN
  Administered 2021-10-01: 500 [IU]
  Filled 2021-10-01: qty 5

## 2021-10-01 MED ORDER — SODIUM CHLORIDE 0.9 % IV SOLN
Freq: Once | INTRAVENOUS | Status: AC
  Filled 2021-10-01: qty 250

## 2021-10-01 MED ORDER — SODIUM CHLORIDE 0.9 % IV SOLN
10.0000 mg | Freq: Once | INTRAVENOUS | Status: AC
  Administered 2021-10-01: 10 mg via INTRAVENOUS
  Filled 2021-10-01: qty 10

## 2021-10-01 NOTE — Patient Instructions (Signed)
[facility]  Discharge Instructions: Thank you for choosing [facility] to provide your oncology and hematology care.  If you have a lab appointment with the Cancer Center, please go directly to the Cancer Center and check in at the registration area.  Wear comfortable clothing and clothing appropriate for easy access to any Portacath or PICC line.   We strive to give you quality time with your provider. You may need to reschedule your appointment if you arrive late (15 or more minutes).  Arriving late affects you and other patients whose appointments are after yours.  Also, if you miss three or more appointments without notifying the office, you may be dismissed from the clinic at the provider's discretion.      For prescription refill requests, have your pharmacy contact our office and allow 72 hours for refills to be completed.    Today you received the following chemotherapy and/or immunotherapy agents: Alimta      To help prevent nausea and vomiting after your treatment, we encourage you to take your nausea medication as directed.  BELOW ARE SYMPTOMS THAT SHOULD BE REPORTED IMMEDIATELY: *FEVER GREATER THAN 100.4 F (38 C) OR HIGHER *CHILLS OR SWEATING *NAUSEA AND VOMITING THAT IS NOT CONTROLLED WITH YOUR NAUSEA MEDICATION *UNUSUAL SHORTNESS OF BREATH *UNUSUAL BRUISING OR BLEEDING *URINARY PROBLEMS (pain or burning when urinating, or frequent urination) *BOWEL PROBLEMS (unusual diarrhea, constipation, pain near the anus) TENDERNESS IN MOUTH AND THROAT WITH OR WITHOUT PRESENCE OF ULCERS (sore throat, sores in mouth, or a toothache) UNUSUAL RASH, SWELLING OR PAIN  UNUSUAL VAGINAL DISCHARGE OR ITCHING   Items with * indicate a potential emergency and should be followed up as soon as possible or go to the Emergency Department if any problems should occur.  Please show the CHEMOTHERAPY ALERT CARD or IMMUNOTHERAPY ALERT CARD at check-in to the  Emergency Department and triage nurse.  Should you have questions after your visit or need to cancel or reschedule your appointment, please contact [facility]  [phone] and follow the prompts.  Office hours are 8:00 a.m. to 4:30 p.m. Monday - Friday. Please note that voicemails left after 4:00 p.m. may not be returned until the following business day.  We are closed weekends and major holidays. You have access to a nurse at all times for urgent questions. Please call the main number to the clinic [phone] and follow the prompts.  For any non-urgent questions, you may also contact your provider using MyChart. We now offer e-Visits for anyone 18 and older to request care online for non-urgent symptoms. For details visit [email or web address].   Also download the MyChart app! Go to the app store, search "MyChart", open the app, select [facility], and log in with your MyChart username and password.  Masks are optional in the cancer centers. If you would like for your care team to wear a mask while they are taking care of you, please let them know. For doctor visits, patients may have with them one support person who is at least 72 years old. At this time, visitors are not allowed in the infusion area.   

## 2021-10-01 NOTE — Progress Notes (Signed)
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  He underwent CT angios chest which did not show any PE.  Soft tissue attenuation measuring 2.9 x 4 cm centered in the left hilum resulting in abrupt angulation and narrowing of the pulmonary arteries and the central left lower lobe airways.  Additional ipsilateral hilar and subcarinal adenopathy.  No contralateral adenopathy.  Consolidative masslike opacity 3.6 x 3.1 cm in size and contiguous with more central perihilar soft tissue attenuation.  Overall findings concerning for primary bronchogenic carcinoma.    Patient lives with his sister who is his main caregiver.  He does not drive but is independent of his ADLs.  Reports ongoing fatigue and occasional retrosternal chest pain.  He has been evaluated by GI in the past for reflux as well.  Appetie is fair and weight is stable.  Denies any significant shortness of breath at this time.  Patient is an ex-smoker and smoked for about 20 years but quit smoking back in 1994   PET CT scan showed hypermetabolic mass in the left lower lobe with mediastinal adenopathy and right paratracheal adenopathy.  No evidence of distant metastatic disease.  Pathology was consistent with non-small cell lung cancer favor adenocarcinoma.  Cells were positive for TTF-1 and negative for p40.    Patient started concurrent carbotaxol radiation and then had allergic reaction to Taxol for which she was switched to Abraxane.  Given the short supply of Abraxane patient received carbo Alimta midway through his concurrent chemoradiation.  Scans post chemoradiation showed partial response and patient will be started on maintenance durvalumab on 01/14/2020.  Patient was on maintenance durvalumab and received 15 cycles up until August 2022.  He was noted to have worsening chest pain and was in the ER multiple times in September 2022.  Repeat CT scan showed concern for hilar recurrence.  Patient received palliative radiation to the growing left upper lobe lung massWas given carbo Alimta for 4 cycles and presently on maintenance Alimta  Interval history- Today patient reports that he is dong well. He denies any difficulties with his treatment. He is ready and able for treatment today. His weight is stable.   ECOG PS- 1 Pain scale- 0   Review of systems- Review of Systems  Constitutional:  Negative for chills, fever, malaise/fatigue and weight loss.  HENT:  Negative for congestion, ear discharge and nosebleeds.   Eyes:  Negative for blurred vision.  Respiratory:  Negative for cough, hemoptysis, sputum production, shortness of breath and wheezing.   Cardiovascular:  Negative for chest pain, palpitations, orthopnea and claudication.  Gastrointestinal:  Negative for abdominal pain, blood in stool, constipation, diarrhea, heartburn, melena, nausea and vomiting.  Genitourinary:  Negative for dysuria, flank pain, frequency, hematuria and urgency.  Musculoskeletal:  Negative for back pain, joint pain and myalgias.  Skin:  Negative for rash.  Neurological:  Negative for dizziness, tingling,  focal weakness, seizures, weakness and headaches.  Endo/Heme/Allergies:  Does not bruise/bleed easily.  Psychiatric/Behavioral:  Negative for depression and suicidal ideas. The patient does not have insomnia.        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 ([place])      Past Surgical History:  Procedure Laterality Date   ESOPHAGOGASTRODUODENOSCOPY (EGD) WITH PROPOFOL N/A 12/01/2020   Procedure: ESOPHAGOGASTRODUODENOSCOPY (EGD) WITH PROPOFOL;  Surgeon: Lin Landsman, MD;  Location: [facility] ENDOSCOPY;  Service: Gastroenterology;  Laterality: N/A;   PORTA CATH INSERTION N/A 10/22/2019   Procedure: PORTA CATH INSERTION;  Surgeon: Katha Cabal, MD;  Location: [facility];  Service: Cardiovascular;  Laterality: N/A;   VIDEO BRONCHOSCOPY WITH ENDOBRONCHIAL ULTRASOUND N/A 09/25/2019   Procedure: VIDEO BRONCHOSCOPY WITH ENDOBRONCHIAL ULTRASOUND;  Surgeon: Tyler Pita, MD;  Location: [facility] ORS;  Service: Pulmonary;  Laterality: N/A;    Social History   Socioeconomic History   Marital status: Single    Spouse name: Not on file   Number of children: Not on file   Years of education: Not on file   Highest education level: Not on file  Occupational History   Not on file  Tobacco Use   Smoking status: Former    Packs/day: 1.00    Years: 20.00    Total pack years: 20.00    Types: Cigarettes    Quit date: 46    Years since quitting: 29.6   Smokeless tobacco: Never  Vaping Use   Vaping Use: Never used  Substance and Sexual Activity   Alcohol use: Not Currently    Comment: not drank any beer in 2 onths   Drug use: Never   Sexual activity: Not Currently  Other Topics Concern   Not on file  Social History Narrative   Not on file   Social Determinants of Health   Financial Resource Strain: Not on file  Food Insecurity: Not on file  Transportation Needs: Not on file  Physical Activity: Not on file  Stress: Not on file  Social Connections: Not on  file  Intimate Partner Violence: Not on file    Family History  Problem Relation Age of Onset   Cancer Brother      Current Outpatient Medications:    acetaminophen (TYLENOL) 500 MG tablet, Take 1,000 mg by mouth every 6 (six) hours as needed for mild pain, moderate pain or headache. (Patient not taking: Reported on 09/10/2021), Disp: , Rfl:    albuterol (VENTOLIN HFA) 108 (90 Base) MCG/ACT inhaler, Inhale 2 puffs into the lungs every 4 (four) hours as needed for wheezing or shortness of breath., Disp: 8 g, Rfl: 1   ANORO ELLIPTA 62.5-25 MCG/ACT AEPB, INHALE 1 PUFF INTO THE LUNGS DAILY (Patient not taking: Reported on 07/09/2021), Disp: 60 each, Rfl: 2   Budeson-Glycopyrrol-Formoterol (BREZTRI AEROSPHERE) 160-9-4.8 MCG/ACT AERO, Inhale 2 puffs into the lungs in the morning and at bedtime., Disp: 10.7 g, Rfl: 5   clobetasol cream (TEMOVATE) 0.05 %, Apply topically. (Patient not taking: Reported on 09/10/2021), Disp: , Rfl:    DULoxetine (CYMBALTA) 30 MG capsule, Take 1 capsule (30 mg total) by mouth daily., Disp: 30 capsule, Rfl: 3   DULoxetine (CYMBALTA) 60 MG capsule, Take  60 mg by mouth daily. (Patient not taking: Reported on 05/07/2021), Disp: , Rfl:    FLOMAX 0.4 MG CAPS capsule, Take 0.4 mg by mouth daily. (Patient not taking: Reported on 05/07/2021), Disp: , Rfl:    FLOVENT HFA 220 MCG/ACT inhaler, Inhale 2 puffs into the lungs 2 (two) times daily. (Patient not taking: Reported on 05/07/2021), Disp: , Rfl:    folic acid (FOLVITE) 1 MG tablet, Take 1 tablet (1 mg total) by mouth daily. (Patient not taking: Reported on 05/07/2021), Disp: 30 tablet, Rfl: 6   hydrOXYzine (ATARAX/VISTARIL) 25 MG tablet, Take 25 mg by mouth daily. (Patient not taking: Reported on 05/07/2021), Disp: , Rfl:    lidocaine (LIDODERM) 5 %, Place 1 patch onto the skin daily. Remove & Discard patch within 12 hours or as directed by MD (Patient not taking: Reported on 05/07/2021), Disp: 30 patch, Rfl: 0   lidocaine (XYLOCAINE)  2 % solution, , Disp: , Rfl:    lidocaine-prilocaine (EMLA) cream, Apply 1 application topically as needed. (Patient not taking: Reported on 05/07/2021), Disp: 30 g, Rfl: 3   LORazepam (ATIVAN) 0.5 MG tablet, Take 1 tablet (0.5 mg total) by mouth every 8 (eight) hours as needed for anxiety. (Patient not taking: Reported on 05/07/2021), Disp: 60 tablet, Rfl: 0   MIRALAX 17 GM/SCOOP powder, Take 17 g by mouth daily. (Patient not taking: Reported on 05/07/2021), Disp: , Rfl:    morphine (MS CONTIN) 30 MG 12 hr tablet, Take 1 tablet (30 mg total) by mouth every 8 (eight) hours. (Patient not taking: Reported on 05/07/2021), Disp: 90 tablet, Rfl: 0   naloxegol oxalate (MOVANTIK) 25 MG TABS tablet, Take 1 tablet (25 mg total) by mouth daily. (Patient not taking: Reported on 05/07/2021), Disp: 30 tablet, Rfl: 2   naloxone (NARCAN) nasal spray 4 mg/0.1 mL, SPRAY 1 SPRAY INTO ONE NOSTRIL AS DIRECTED FOR OPIOID OVERDOSE (TURN PERSON ON SIDE AFTER DOSE. IF NO RESPONSE IN 2-3 MINUTES OR PERSON RESPONDS BUT RELAPSES, REPEAT USING A NEW SPRAY DEVICE AND SPRAY INTO THE OTHER NOSTRIL. CALL 911 AFTER USE.) * EMERGENCY USE ONLY * (Patient not taking: Reported on 05/07/2021), Disp: 1 each, Rfl: 0   nystatin (MYCOSTATIN) 100000 UNIT/ML suspension, Take by mouth. (Patient not taking: Reported on 05/07/2021), Disp: , Rfl:    Oxycodone HCl 20 MG TABS, Take 1 tablet (20 mg total) by mouth every 3 (three) hours as needed (for breakthrough pain). (Patient not taking: Reported on 05/07/2021), Disp: 120 tablet, Rfl: 0   pantoprazole (PROTONIX) 40 MG tablet, Take 1 tablet (40mg ) twice daily for two weeks and then take 1 tablet daily thereafter (Patient not taking: Reported on 05/07/2021), Disp: 60 tablet, Rfl: 3   potassium chloride SA (KLOR-CON) 20 MEQ tablet, TAKE 1 TABLET BY MOUTH TWICE DAILY (Patient not taking: Reported on 05/07/2021), Disp: 30 tablet, Rfl: 1   sucralfate (CARAFATE) 1 g tablet, Take 1 tablet (1 g total) by mouth 4 (four)  times daily. (Patient not taking: Reported on 05/07/2021), Disp: 60 tablet, Rfl: 0  Physical exam:  There were no vitals filed for this visit.  Physical Exam Constitutional:      General: He is not in acute distress. Cardiovascular:     Rate and Rhythm: Normal rate and regular rhythm.     Heart sounds: Normal heart sounds.  Pulmonary:     Effort: Pulmonary effort is normal.     Breath sounds: Normal breath sounds.  Skin:    General: Skin is warm  and dry.     Comments: Long thick fingernails   Neurological:     Mental Status: He is alert and oriented to person, place, and time.         Latest Ref Rng & Units 09/10/2021    1:13 PM  CMP  Glucose 70 - 99 mg/dL 121   BUN 8 - 23 mg/dL 15   Creatinine 0.61 - 1.24 mg/dL 0.97   Sodium 135 - 145 mmol/L 140   Potassium 3.5 - 5.1 mmol/L 3.7   Chloride 98 - 111 mmol/L 111   CO2 22 - 32 mmol/L 23   Calcium 8.9 - 10.3 mg/dL 8.8   Total Protein 6.5 - 8.1 g/dL 7.2   Total Bilirubin 0.3 - 1.2 mg/dL 0.4   Alkaline Phos 38 - 126 U/L 93   AST 15 - 41 U/L 23   ALT 0 - 44 U/L 8       Latest Ref Rng & Units 09/10/2021    1:13 PM  CBC  WBC 4.0 - 10.5 K/uL 5.5   Hemoglobin 13.0 - 17.0 g/dL 9.4   Hematocrit 39.0 - 52.0 % 30.9   Platelets 150 - 400 K/uL 459       Assessment and plan- Patient is a 71 y.o. male with recurrent [name] adenocarcinoma of the lung.    He continues to tolerate his Alimta well without any significant side effects.  Blood work reviewed and stable for treatment. Counts otherwise okay to proceed with cycle 15 of maintenance Alimta today. B12 as well.   RTC 3 weeks with MD, labs +- treatment   His repeat scans are planned for September 2023   Visit Diagnosis No diagnosis found.    Nelwyn Salisbury PA-C [place] [facility] 10/01/2021 2:00 PM

## 2021-10-22 ENCOUNTER — Inpatient Hospital Stay: Payer: Medicare HMO | Attending: Radiation Oncology

## 2021-10-22 ENCOUNTER — Inpatient Hospital Stay (HOSPITAL_BASED_OUTPATIENT_CLINIC_OR_DEPARTMENT_OTHER): Payer: Medicare HMO | Admitting: Oncology

## 2021-10-22 ENCOUNTER — Encounter: Payer: Self-pay | Admitting: Oncology

## 2021-10-22 ENCOUNTER — Inpatient Hospital Stay: Payer: Medicare HMO

## 2021-10-22 VITALS — BP 127/75 | HR 71 | Temp 97.6°F | Resp 18 | Wt 173.6 lb

## 2021-10-22 DIAGNOSIS — Z79899 Other long term (current) drug therapy: Secondary | ICD-10-CM | POA: Insufficient documentation

## 2021-10-22 DIAGNOSIS — C3432 Malignant neoplasm of lower lobe, left bronchus or lung: Secondary | ICD-10-CM

## 2021-10-22 DIAGNOSIS — Z5111 Encounter for antineoplastic chemotherapy: Secondary | ICD-10-CM | POA: Insufficient documentation

## 2021-10-22 DIAGNOSIS — I10 Essential (primary) hypertension: Secondary | ICD-10-CM | POA: Insufficient documentation

## 2021-10-22 DIAGNOSIS — Z87891 Personal history of nicotine dependence: Secondary | ICD-10-CM | POA: Diagnosis not present

## 2021-10-22 DIAGNOSIS — Z7951 Long term (current) use of inhaled steroids: Secondary | ICD-10-CM | POA: Diagnosis not present

## 2021-10-22 DIAGNOSIS — E538 Deficiency of other specified B group vitamins: Secondary | ICD-10-CM | POA: Insufficient documentation

## 2021-10-22 DIAGNOSIS — E785 Hyperlipidemia, unspecified: Secondary | ICD-10-CM | POA: Diagnosis not present

## 2021-10-22 LAB — COMPREHENSIVE METABOLIC PANEL
ALT: 10 U/L (ref 0–44)
AST: 21 U/L (ref 15–41)
Albumin: 3.3 g/dL — ABNORMAL LOW (ref 3.5–5.0)
Alkaline Phosphatase: 80 U/L (ref 38–126)
Anion gap: 7 (ref 5–15)
BUN: 16 mg/dL (ref 8–23)
CO2: 22 mmol/L (ref 22–32)
Calcium: 8.5 mg/dL — ABNORMAL LOW (ref 8.9–10.3)
Chloride: 109 mmol/L (ref 98–111)
Creatinine, Ser: 1.07 mg/dL (ref 0.61–1.24)
GFR, Estimated: >60 mL/min (ref >60)
Glucose, Bld: 87 mg/dL (ref 70–99)
Potassium: 4 mmol/L (ref 3.5–5.1)
Sodium: 138 mmol/L (ref 135–145)
Total Bilirubin: 0.4 mg/dL (ref 0.3–1.2)
Total Protein: 7.1 g/dL (ref 6.5–8.1)

## 2021-10-22 LAB — CBC WITH DIFFERENTIAL/PLATELET
Abs Immature Granulocytes: 0.02 10*3/uL (ref 0.00–0.07)
Basophils Absolute: 0 10*3/uL (ref 0.0–0.1)
Basophils Relative: 0 %
Eosinophils Absolute: 0 10*3/uL (ref 0.0–0.5)
Eosinophils Relative: 1 %
HCT: 30.4 % — ABNORMAL LOW (ref 39.0–52.0)
Hemoglobin: 9.3 g/dL — ABNORMAL LOW (ref 13.0–17.0)
Immature Granulocytes: 0 %
Lymphocytes Relative: 22 %
Lymphs Abs: 1.1 10*3/uL (ref 0.7–4.0)
MCH: 27.2 pg (ref 26.0–34.0)
MCHC: 30.6 g/dL (ref 30.0–36.0)
MCV: 88.9 fL (ref 80.0–100.0)
Monocytes Absolute: 0.6 10*3/uL (ref 0.1–1.0)
Monocytes Relative: 13 %
Neutro Abs: 3.1 10*3/uL (ref 1.7–7.7)
Neutrophils Relative %: 64 %
Platelets: 386 10*3/uL (ref 150–400)
RBC: 3.42 MIL/uL — ABNORMAL LOW (ref 4.22–5.81)
RDW: 20.5 % — ABNORMAL HIGH (ref 11.5–15.5)
WBC: 4.9 10*3/uL (ref 4.0–10.5)
nRBC: 0 % (ref 0.0–0.2)

## 2021-10-22 MED ORDER — SODIUM CHLORIDE 0.9 % IV SOLN
500.0000 mg/m2 | Freq: Once | INTRAVENOUS | Status: AC
  Administered 2021-10-22: 1000 mg via INTRAVENOUS
  Filled 2021-10-22: qty 40

## 2021-10-22 MED ORDER — PROCHLORPERAZINE MALEATE 10 MG PO TABS
10.0000 mg | ORAL_TABLET | Freq: Once | ORAL | Status: AC
  Administered 2021-10-22: 10 mg via ORAL
  Filled 2021-10-22: qty 1

## 2021-10-22 MED ORDER — SODIUM CHLORIDE 0.9 % IV SOLN
Freq: Once | INTRAVENOUS | Status: AC
  Filled 2021-10-22: qty 250

## 2021-10-22 MED ORDER — HEPARIN SOD (PORK) LOCK FLUSH 100 UNIT/ML IV SOLN
500.0000 [IU] | Freq: Once | INTRAVENOUS | Status: AC | PRN
  Administered 2021-10-22: 500 [IU]
  Filled 2021-10-22: qty 5

## 2021-10-22 NOTE — Patient Instructions (Signed)
[facility]  Discharge Instructions: Thank you for choosing [facility] to provide your oncology and hematology care.  If you have a lab appointment with the Cancer Center, please go directly to the Cancer Center and check in at the registration area.  Wear comfortable clothing and clothing appropriate for easy access to any Portacath or PICC line.   We strive to give you quality time with your provider. You may need to reschedule your appointment if you arrive late (15 or more minutes).  Arriving late affects you and other patients whose appointments are after yours.  Also, if you miss three or more appointments without notifying the office, you may be dismissed from the clinic at the provider's discretion.      For prescription refill requests, have your pharmacy contact our office and allow 72 hours for refills to be completed.    Today you received the following chemotherapy and/or immunotherapy agents: Alimta      To help prevent nausea and vomiting after your treatment, we encourage you to take your nausea medication as directed.  BELOW ARE SYMPTOMS THAT SHOULD BE REPORTED IMMEDIATELY: *FEVER GREATER THAN 100.4 F (38 C) OR HIGHER *CHILLS OR SWEATING *NAUSEA AND VOMITING THAT IS NOT CONTROLLED WITH YOUR NAUSEA MEDICATION *UNUSUAL SHORTNESS OF BREATH *UNUSUAL BRUISING OR BLEEDING *URINARY PROBLEMS (pain or burning when urinating, or frequent urination) *BOWEL PROBLEMS (unusual diarrhea, constipation, pain near the anus) TENDERNESS IN MOUTH AND THROAT WITH OR WITHOUT PRESENCE OF ULCERS (sore throat, sores in mouth, or a toothache) UNUSUAL RASH, SWELLING OR PAIN  UNUSUAL VAGINAL DISCHARGE OR ITCHING   Items with * indicate a potential emergency and should be followed up as soon as possible or go to the Emergency Department if any problems should occur.  Please show the CHEMOTHERAPY ALERT CARD or IMMUNOTHERAPY ALERT CARD at check-in to the  Emergency Department and triage nurse.  Should you have questions after your visit or need to cancel or reschedule your appointment, please contact [facility]  [phone] and follow the prompts.  Office hours are 8:00 a.m. to 4:30 p.m. Monday - Friday. Please note that voicemails left after 4:00 p.m. may not be returned until the following business day.  We are closed weekends and major holidays. You have access to a nurse at all times for urgent questions. Please call the main number to the clinic [phone] and follow the prompts.  For any non-urgent questions, you may also contact your provider using MyChart. We now offer e-Visits for anyone 18 and older to request care online for non-urgent symptoms. For details visit [email or web address].   Also download the MyChart app! Go to the app store, search "MyChart", open the app, select [facility], and log in with your MyChart username and password.  Masks are optional in the cancer centers. If you would like for your care team to wear a mask while they are taking care of you, please let them know. For doctor visits, patients may have with them one support person who is at least 72 years old. At this time, visitors are not allowed in the infusion area.   

## 2021-10-22 NOTE — Progress Notes (Signed)
OFF PATHWAY REGIMEN - Non-Small Cell Lung  No Change  Continue With Treatment as Ordered.  Original Decision Date/Time: 12/08/2020 14:20   [id number]:Carboplatin AUC=5 + Pemetrexed 500 mg/m2 q21 Days:   A cycle is every 21 days:     Pemetrexed      Carboplatin   **Always confirm dose/schedule in your pharmacy ordering system**  Patient Characteristics: Preoperative or Nonsurgical Candidate (Clinical Staging), [name] - Nonsurgical Candidate (Nonsquamous and Squamous), PS = 0, 1 Therapeutic Status: Preoperative or Nonsurgical Candidate (Clinical Staging) AJCC T Category: cT2b AJCC N Category: cN3 AJCC M Category: cM0 AJCC 8 Stage Grouping: IIIB ECOG Performance Status: 1 Intent of Therapy: Non-Curative / Palliative Intent, Discussed with Patient

## 2021-10-22 NOTE — Progress Notes (Signed)
Pt will like to change his appt to the mornings. Due to being able to find work throughout the day.

## 2021-10-22 NOTE — Progress Notes (Signed)
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  He underwent CT angios chest which did not show any PE.  Soft tissue attenuation measuring 2.9 x 4 cm centered in the left hilum resulting in abrupt angulation and narrowing of the pulmonary arteries and the central left lower lobe airways.  Additional ipsilateral hilar and subcarinal adenopathy.  No contralateral adenopathy.  Consolidative masslike opacity 3.6 x 3.1 cm in size and contiguous with more central perihilar soft tissue attenuation.  Overall findings concerning for primary bronchogenic carcinoma.    Patient lives with his sister who is his main caregiver.  He does not drive but is independent of his ADLs.  Reports ongoing fatigue and occasional retrosternal chest pain.  He has been evaluated by GI in the past for reflux as well.  Appetie is fair and weight is stable.  Denies any significant shortness of breath at this time.  Patient is an ex-smoker and smoked for about 20 years but quit smoking back in 1994   PET CT scan showed hypermetabolic mass in the left lower lobe with mediastinal adenopathy and right paratracheal adenopathy.  No evidence of distant metastatic disease.  Pathology was consistent with non-small cell lung cancer favor adenocarcinoma.  Cells were positive for TTF-1 and negative for p40.    Patient started concurrent carbotaxol radiation and then had allergic reaction to Taxol for which she was switched to Abraxane.  Given the short supply of Abraxane patient received carbo Alimta midway through his concurrent chemoradiation.  Scans post chemoradiation showed partial response and patient will be started on maintenance durvalumab on 01/14/2020.  Patient was on maintenance durvalumab and received 15 cycles up until August 2022.  He was noted to have worsening chest pain and was in the ER multiple times in September 2022.  Repeat CT scan showed concern for hilar recurrence.  Patient received palliative radiation to the growing left upper lobe lung massWas given carbo Alimta for 4 cycles and presently on maintenance Alimta  Interval history-patient is doing well overall.  Denies any cough shortness of breath or chest pain.  He is using his as needed inhalers.  ECOG PS- 1 Pain scale- 0  Review of systems- Review of Systems  Constitutional:  Positive for malaise/fatigue. Negative for chills, fever and weight loss.  HENT:  Negative for congestion, ear discharge and nosebleeds.   Eyes:  Negative for blurred vision.  Respiratory:  Negative for cough, hemoptysis, sputum production, shortness of breath and wheezing.   Cardiovascular:  Negative for chest pain, palpitations, orthopnea and claudication.  Gastrointestinal:  Negative for abdominal pain, blood in stool, constipation, diarrhea, heartburn, melena, nausea and vomiting.  Genitourinary:  Negative for dysuria, flank pain, frequency, hematuria and urgency.  Musculoskeletal:  Negative for back pain, joint pain and myalgias.  Skin:  Negative for rash.  Neurological:  Negative for dizziness, tingling, focal weakness, seizures, weakness and headaches.  Endo/Heme/Allergies:  Does not bruise/bleed easily.  Psychiatric/Behavioral:  Negative for depression and suicidal ideas. The patient does not have insomnia.       Allergies  Allergen  Reactions   Taxol [Paclitaxel] Other (See Comments)    Chest pain, hip pain, coughing , wheezing     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 ([place])      Past Surgical History:  Procedure Laterality Date   ESOPHAGOGASTRODUODENOSCOPY (EGD) WITH PROPOFOL N/A 12/01/2020   Procedure: ESOPHAGOGASTRODUODENOSCOPY (EGD) WITH PROPOFOL;  Surgeon: Lin Landsman, MD;  Location: [facility] ENDOSCOPY;  Service: Gastroenterology;  Laterality: N/A;   PORTA CATH INSERTION N/A 10/22/2019   Procedure: PORTA CATH INSERTION;  Surgeon: Katha Cabal, MD;  Location: [facility];  Service: Cardiovascular;  Laterality: N/A;   VIDEO BRONCHOSCOPY WITH ENDOBRONCHIAL ULTRASOUND N/A 09/25/2019   Procedure: VIDEO BRONCHOSCOPY WITH ENDOBRONCHIAL ULTRASOUND;  Surgeon: Tyler Pita, MD;  Location: [facility] ORS;  Service: Pulmonary;  Laterality: N/A;    Social History   Socioeconomic History   Marital status: Single    Spouse name: Not on file   Number of children: Not on file   Years of education: Not on file   Highest education level: Not on file  Occupational History   Not on file  Tobacco Use   Smoking status: Former    Packs/day: 1.00    Years: 20.00    Total pack years: 20.00    Types: Cigarettes    Quit date: 23    Years since quitting: 29.7   Smokeless tobacco: Never  Vaping Use   Vaping Use: Never used  Substance and Sexual Activity   Alcohol use: Not Currently    Comment: not drank any beer in 2 onths   Drug use: Never   Sexual activity: Not Currently  Other Topics Concern   Not on file  Social History Narrative   Not on file   Social Determinants of Health   Financial Resource Strain: Not on file  Food Insecurity: Not on file  Transportation Needs: Not on file  Physical Activity: Not on file  Stress: Not on file  Social Connections: Not on file  Intimate Partner  Violence: Not on file    Family History  Problem Relation Age of Onset   Cancer Brother      Current Outpatient Medications:    albuterol (VENTOLIN HFA) 108 (90 Base) MCG/ACT inhaler, Inhale 2 puffs into the lungs every 4 (four) hours as needed for wheezing or shortness of breath., Disp: 8 g, Rfl: 1   Budeson-Glycopyrrol-Formoterol (BREZTRI AEROSPHERE) 160-9-4.8 MCG/ACT AERO, Inhale 2 puffs into the lungs in the morning and at bedtime., Disp: 10.7 g, Rfl: 5   DULoxetine (CYMBALTA) 30 MG capsule, Take 1 capsule (30 mg total) by mouth daily., Disp: 30 capsule, Rfl: 3   acetaminophen (TYLENOL) 500 MG tablet, Take 1,000 mg by mouth every 6 (six) hours as needed for mild pain, moderate pain or headache. (Patient not taking: Reported on 09/10/2021), Disp: , Rfl:    ANORO ELLIPTA 62.5-25 MCG/ACT AEPB, INHALE 1 PUFF INTO THE LUNGS DAILY (Patient not taking: Reported on 07/09/2021), Disp: 60 each, Rfl: 2   clobetasol cream (TEMOVATE) 0.05 %, Apply topically. (Patient not taking: Reported on 09/10/2021), Disp: , Rfl:    DULoxetine (CYMBALTA) 60 MG capsule, Take 60 mg by mouth daily. (Patient not  taking: Reported on 05/07/2021), Disp: , Rfl:    FLOMAX 0.4 MG CAPS capsule, Take 0.4 mg by mouth daily. (Patient not taking: Reported on 05/07/2021), Disp: , Rfl:    FLOVENT HFA 220 MCG/ACT inhaler, Inhale 2 puffs into the lungs 2 (two) times daily. (Patient not taking: Reported on 05/07/2021), Disp: , Rfl:    folic acid (FOLVITE) 1 MG tablet, Take 1 tablet (1 mg total) by mouth daily. (Patient not taking: Reported on 05/07/2021), Disp: 30 tablet, Rfl: 6   hydrOXYzine (ATARAX/VISTARIL) 25 MG tablet, Take 25 mg by mouth daily. (Patient not taking: Reported on 05/07/2021), Disp: , Rfl:    lidocaine (LIDODERM) 5 %, Place 1 patch onto the skin daily. Remove & Discard patch within 12 hours or as directed by MD (Patient not taking: Reported on 05/07/2021), Disp: 30 patch, Rfl: 0   lidocaine (XYLOCAINE) 2 % solution, , Disp: ,  Rfl:    lidocaine-prilocaine (EMLA) cream, Apply 1 application topically as needed. (Patient not taking: Reported on 05/07/2021), Disp: 30 g, Rfl: 3   LORazepam (ATIVAN) 0.5 MG tablet, Take 1 tablet (0.5 mg total) by mouth every 8 (eight) hours as needed for anxiety. (Patient not taking: Reported on 05/07/2021), Disp: 60 tablet, Rfl: 0   MIRALAX 17 GM/SCOOP powder, Take 17 g by mouth daily. (Patient not taking: Reported on 05/07/2021), Disp: , Rfl:    morphine (MS CONTIN) 30 MG 12 hr tablet, Take 1 tablet (30 mg total) by mouth every 8 (eight) hours. (Patient not taking: Reported on 05/07/2021), Disp: 90 tablet, Rfl: 0   naloxegol oxalate (MOVANTIK) 25 MG TABS tablet, Take 1 tablet (25 mg total) by mouth daily. (Patient not taking: Reported on 05/07/2021), Disp: 30 tablet, Rfl: 2   naloxone (NARCAN) nasal spray 4 mg/0.1 mL, SPRAY 1 SPRAY INTO ONE NOSTRIL AS DIRECTED FOR OPIOID OVERDOSE (TURN PERSON ON SIDE AFTER DOSE. IF NO RESPONSE IN 2-3 MINUTES OR PERSON RESPONDS BUT RELAPSES, REPEAT USING A NEW SPRAY DEVICE AND SPRAY INTO THE OTHER NOSTRIL. CALL 911 AFTER USE.) * EMERGENCY USE ONLY * (Patient not taking: Reported on 05/07/2021), Disp: 1 each, Rfl: 0   nystatin (MYCOSTATIN) 100000 UNIT/ML suspension, Take by mouth. (Patient not taking: Reported on 05/07/2021), Disp: , Rfl:    Oxycodone HCl 20 MG TABS, Take 1 tablet (20 mg total) by mouth every 3 (three) hours as needed (for breakthrough pain). (Patient not taking: Reported on 05/07/2021), Disp: 120 tablet, Rfl: 0   pantoprazole (PROTONIX) 40 MG tablet, Take 1 tablet (40mg ) twice daily for two weeks and then take 1 tablet daily thereafter (Patient not taking: Reported on 05/07/2021), Disp: 60 tablet, Rfl: 3   potassium chloride SA (KLOR-CON) 20 MEQ tablet, TAKE 1 TABLET BY MOUTH TWICE DAILY (Patient not taking: Reported on 05/07/2021), Disp: 30 tablet, Rfl: 1   sucralfate (CARAFATE) 1 g tablet, Take 1 tablet (1 g total) by mouth 4 (four) times daily. (Patient not  taking: Reported on 05/07/2021), Disp: 60 tablet, Rfl: 0  Physical exam:  Vitals:   10/22/21 1402  BP: 127/75  Pulse: 71  Resp: 18  Temp: 97.6 F (36.4 C)  SpO2: 100%  Weight: 173 lb 9.6 oz (78.7 kg)   Physical Exam Constitutional:      General: He is not in acute distress. Cardiovascular:     Rate and Rhythm: Normal rate and regular rhythm.     Heart sounds: Normal heart sounds.  Pulmonary:     Effort: Pulmonary effort is normal.  Breath sounds: Normal breath sounds.  Abdominal:     General: Bowel sounds are normal.     Palpations: Abdomen is soft.  Skin:    General: Skin is warm and dry.  Neurological:     Mental Status: He is alert and oriented to person, place, and time.         Latest Ref Rng & Units 10/22/2021    1:41 PM  CMP  Glucose 70 - 99 mg/dL 87   BUN 8 - 23 mg/dL 16   Creatinine 0.61 - 1.24 mg/dL 1.07   Sodium 135 - 145 mmol/L 138   Potassium 3.5 - 5.1 mmol/L 4.0   Chloride 98 - 111 mmol/L 109   CO2 22 - 32 mmol/L 22   Calcium 8.9 - 10.3 mg/dL 8.5   Total Protein 6.5 - 8.1 g/dL 7.1   Total Bilirubin 0.3 - 1.2 mg/dL 0.4   Alkaline Phos 38 - 126 U/L 80   AST 15 - 41 U/L 21   ALT 0 - 44 U/L 10       Latest Ref Rng & Units 10/22/2021    1:41 PM  CBC  WBC 4.0 - 10.5 K/uL 4.9   Hemoglobin 13.0 - 17.0 g/dL 9.3   Hematocrit 39.0 - 52.0 % 30.4   Platelets 150 - 400 K/uL 386     Assessment and plan- Patient is a 71 y.o. male with recurrent adenocarcinoma of the lung here for on treatment assessment prior to cycle 16 of maintenance Alimta  Counts okay to proceed with Alimta today.  If his hemoglobin continues to go down I will consider giving him a break from treatment.  Serum creatinine remains stable.  He is receiving B12 injections every 3 weeks as well which she will continue.  I will see him back in 3 weeks for cycle 17.   Visit Diagnosis 1. Malignant neoplasm of lower lobe of left lung ([place])   2. Encounter for antineoplastic chemotherapy       Dr. Randa Evens, MD, MPH [facility] [phone] 10/22/2021 4:34 PM

## 2021-11-03 ENCOUNTER — Ambulatory Visit
Admission: RE | Admit: 2021-11-03 | Discharge: 2021-11-03 | Disposition: A | Discharge status: Home / Self Care | Payer: Medicare HMO | Source: Ambulatory Visit | Attending: Radiation Oncology | Admitting: Radiation Oncology

## 2021-11-03 VITALS — BP 122/79 | HR 77 | Temp 97.0°F | Resp 18 | Ht 70.0 in | Wt 171.1 lb

## 2021-11-03 DIAGNOSIS — C3411 Malignant neoplasm of upper lobe, right bronchus or lung: Secondary | ICD-10-CM

## 2021-11-03 NOTE — Progress Notes (Signed)
Radiation Oncology Follow up Note  Name: Mitchell Frye   Date:   11/03/2021 MRN:  [id number] DOB: [birth year]    This 71 y.o. male presents to the clinic today for 70-month follow-up status post salvage radiation therapy to his chest and patient with known stage IIIb non-small cell lung cancer of the left hilum previously treated almost 2-1/2 years prior.  REFERRING PROVIDER: Center, [employer]*  HPI: Patient is a 71 year old male now out 9 months having completed salvage radiation therapy to his chest and patient now out 3 years from initial treatment to his left hilar region for non-small cell lung cancer.  Seen today in routine follow-up he is doing well specifically [place] cough hemoptysis or any change in his pulmonary status..  Recent CT scan of his chest back in June shows area of confluent left hilar and adjacent mediastinal soft tissue may be slightly larger than last month although consistent with posttreatment changes.  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  I have reviewed those films all compatible with radiation change  COMPLICATIONS OF TREATMENT: none  FOLLOW UP COMPLIANCE: keeps appointments  Well-developed well-nourished patient in NAD. HEENT reveals PERLA, EOMI, discs not visualized.  Oral cavity is clear. No oral mucosal lesions are identified. Neck is clear without evidence of cervical or supraclavicular adenopathy. Lungs are clear to A&P. Cardiac examination is essentially unremarkable with regular rate and rhythm without murmur rub or thrill. Abdomen is benign with no organomegaly or masses noted. Motor sensory and DTR levels are equal and symmetric in the upper and lower extremities. Cranial nerves II through XII are grossly intact. Proprioception is intact. No peripheral adenopathy or edema is identified. No motor or sensory levels are noted. Crude visual fields are  within normal range. PHYSICAL EXAM:  BP 122/79   Pulse 77   Temp (!) 97 F (36.1 C)   Resp 18   Ht 5\' 10"  (1.778 m)   Wt 171 lb 1.6 oz (77.6 kg)   BMI 24.55 kg/m  Well-developed well-nourished patient in NAD. HEENT reveals PERLA, EOMI, discs not visualized.  Oral cavity is clear. No oral mucosal lesions are identified. Neck is clear without evidence of cervical or supraclavicular adenopathy. Lungs are clear to A&P. Cardiac examination is essentially unremarkable with regular rate and rhythm without murmur rub or thrill. Abdomen is benign with no organomegaly or masses noted. Motor sensory and DTR levels are equal and symmetric in the upper and lower extremities. Cranial nerves II through XII are grossly intact. Proprioception is intact. No peripheral adenopathy or edema is identified. No motor or sensory levels are noted. Crude visual fields are within normal range.  RADIOLOGY RESULTS: CT scans reviewed compatible with above-stated findings  PLAN: Present time patient is doing well with stable CT findings.  He is asymptomatic.  And pleased with his overall progress have asked to see him back in 6 months with a follow-up CT scan at that time.  Patient knows to call at anytime with any concerns.  I would like to take this opportunity to thank you for allowing me to participate in the care of your patient.Noreene Filbert, MD

## 2021-11-12 ENCOUNTER — Inpatient Hospital Stay (HOSPITAL_BASED_OUTPATIENT_CLINIC_OR_DEPARTMENT_OTHER): Payer: Medicare HMO | Admitting: Oncology

## 2021-11-12 ENCOUNTER — Inpatient Hospital Stay: Payer: Medicare HMO

## 2021-11-12 ENCOUNTER — Telehealth: Payer: Self-pay | Admitting: Oncology

## 2021-11-12 ENCOUNTER — Other Ambulatory Visit: Payer: Self-pay | Admitting: *Deleted

## 2021-11-12 ENCOUNTER — Ambulatory Visit: Payer: Medicaid Other | Admitting: Radiation Oncology

## 2021-11-12 ENCOUNTER — Encounter: Payer: Self-pay | Admitting: Oncology

## 2021-11-12 VITALS — BP 133/77 | HR 72 | Temp 97.7°F | Resp 16 | Ht 70.0 in | Wt 172.3 lb

## 2021-11-12 DIAGNOSIS — Z5111 Encounter for antineoplastic chemotherapy: Secondary | ICD-10-CM | POA: Diagnosis not present

## 2021-11-12 DIAGNOSIS — C3432 Malignant neoplasm of lower lobe, left bronchus or lung: Secondary | ICD-10-CM

## 2021-11-12 DIAGNOSIS — E538 Deficiency of other specified B group vitamins: Secondary | ICD-10-CM

## 2021-11-12 LAB — COMPREHENSIVE METABOLIC PANEL
ALT: 8 U/L (ref 0–44)
AST: 20 U/L (ref 15–41)
Albumin: 3.2 g/dL — ABNORMAL LOW (ref 3.5–5.0)
Alkaline Phosphatase: 81 U/L (ref 38–126)
Anion gap: 3 — ABNORMAL LOW (ref 5–15)
BUN: 17 mg/dL (ref 8–23)
CO2: 24 mmol/L (ref 22–32)
Calcium: 8.7 mg/dL — ABNORMAL LOW (ref 8.9–10.3)
Chloride: 111 mmol/L (ref 98–111)
Creatinine, Ser: 1.03 mg/dL (ref 0.61–1.24)
GFR, Estimated: >60 mL/min (ref >60)
Glucose, Bld: 95 mg/dL (ref 70–99)
Potassium: 3.8 mmol/L (ref 3.5–5.1)
Sodium: 138 mmol/L (ref 135–145)
Total Bilirubin: 0.5 mg/dL (ref 0.3–1.2)
Total Protein: 7.5 g/dL (ref 6.5–8.1)

## 2021-11-12 LAB — CBC WITH DIFFERENTIAL/PLATELET
Abs Immature Granulocytes: 0.03 10*3/uL (ref 0.00–0.07)
Basophils Absolute: 0 10*3/uL (ref 0.0–0.1)
Basophils Relative: 0 %
Eosinophils Absolute: 0 10*3/uL (ref 0.0–0.5)
Eosinophils Relative: 0 %
HCT: 29.3 % — ABNORMAL LOW (ref 39.0–52.0)
Hemoglobin: 9.1 g/dL — ABNORMAL LOW (ref 13.0–17.0)
Immature Granulocytes: 1 %
Lymphocytes Relative: 21 %
Lymphs Abs: 1.3 10*3/uL (ref 0.7–4.0)
MCH: 27.3 pg (ref 26.0–34.0)
MCHC: 31.1 g/dL (ref 30.0–36.0)
MCV: 88 fL (ref 80.0–100.0)
Monocytes Absolute: 0.8 10*3/uL (ref 0.1–1.0)
Monocytes Relative: 13 %
Neutro Abs: 3.9 10*3/uL (ref 1.7–7.7)
Neutrophils Relative %: 65 %
Platelets: 512 10*3/uL — ABNORMAL HIGH (ref 150–400)
RBC: 3.33 MIL/uL — ABNORMAL LOW (ref 4.22–5.81)
RDW: 19.6 % — ABNORMAL HIGH (ref 11.5–15.5)
WBC: 6 10*3/uL (ref 4.0–10.5)
nRBC: 0 % (ref 0.0–0.2)

## 2021-11-12 MED ORDER — SODIUM CHLORIDE 0.9% FLUSH
10.0000 mL | INTRAVENOUS | Status: DC | PRN
  Administered 2021-11-12: 10 mL
  Filled 2021-11-12: qty 10

## 2021-11-12 MED ORDER — CYANOCOBALAMIN 1000 MCG/ML IJ SOLN: 1000.0000 ug | Freq: Once | INTRAMUSCULAR | Status: DC

## 2021-11-12 MED ORDER — FOLIC ACID 1 MG PO TABS: 1.0000 mg | ORAL_TABLET | Freq: Every day | ORAL | 3 refills | Status: DC

## 2021-11-12 MED ORDER — SODIUM CHLORIDE 0.9% FLUSH
10.0000 mL | Freq: Once | INTRAVENOUS | Status: AC
  Administered 2021-11-12: 10 mL via INTRAVENOUS
  Filled 2021-11-12: qty 10

## 2021-11-12 MED ORDER — SODIUM CHLORIDE 0.9 % IV SOLN
Freq: Once | INTRAVENOUS | Status: AC
  Filled 2021-11-12: qty 250

## 2021-11-12 MED ORDER — HEPARIN SOD (PORK) LOCK FLUSH 100 UNIT/ML IV SOLN
500.0000 [IU] | Freq: Once | INTRAVENOUS | Status: AC | PRN
  Administered 2021-11-12: 500 [IU]
  Filled 2021-11-12: qty 5

## 2021-11-12 MED ORDER — SODIUM CHLORIDE 0.9 % IV SOLN
500.0000 mg/m2 | Freq: Once | INTRAVENOUS | Status: AC
  Administered 2021-11-12: 1000 mg via INTRAVENOUS
  Filled 2021-11-12: qty 40

## 2021-11-12 MED ORDER — PROCHLORPERAZINE MALEATE 10 MG PO TABS
10.0000 mg | ORAL_TABLET | Freq: Once | ORAL | Status: AC
  Administered 2021-11-12: 10 mg via ORAL
  Filled 2021-11-12: qty 1

## 2021-11-12 MED ORDER — HEPARIN SOD (PORK) LOCK FLUSH 100 UNIT/ML IV SOLN
500.0000 [IU] | Freq: Once | INTRAVENOUS | Status: DC
  Filled 2021-11-12: qty 5

## 2021-11-12 NOTE — Telephone Encounter (Signed)
Patient was a no show for Mitchell Frye pick up appt. Patient confirmed ride on 11/11/2021.

## 2021-11-12 NOTE — Progress Notes (Signed)
[place]

## 2021-11-12 NOTE — Patient Instructions (Signed)
Pemetrexed Injection What is this medication? PEMETREXED (PEM e TREX ed) treats some types of cancer. It works by slowing down the growth of cancer cells. This medicine may be used for other purposes; ask your health care provider or pharmacist if you have questions. COMMON BRAND NAME(S): Alimta, PEMFEXY What should I tell my care team before I take this medication? They need to know if you have any of these conditions: Infection, such as chickenpox, cold sores, or herpes Kidney disease Low blood cell levels (white cells, red cells, and platelets) Lung or breathing disease, such as asthma Radiation therapy An unusual or allergic reaction to pemetrexed, other medications, foods, dyes, or preservatives If you or your partner are pregnant or trying to get pregnant Breast-feeding How should I use this medication? This medication is injected into a vein. It is given by your care team in a hospital or clinic setting. Talk to your care team about the use of this medication in children. Special care may be needed. Overdosage: If you think you have taken too much of this medicine contact a poison control center or emergency room at once. NOTE: This medicine is only for you. Do not share this medicine with others. What if I miss a dose? Keep appointments for follow-up doses. It is important not to miss your dose. Call your care team if you are unable to keep an appointment. What may interact with this medication? Do not take this medication with any of the following: Live virus vaccines This medication may also interact with the following: Ibuprofen This list may not describe all possible interactions. Give your health care provider a list of all the medicines, herbs, non-prescription drugs, or dietary supplements you use. Also tell them if you smoke, drink alcohol, or use illegal drugs. Some items may interact with your medicine. What should I watch for while using this medication? Your condition  will be monitored carefully while you are receiving this medication. This medication may make you feel generally unwell. This is not uncommon as chemotherapy can affect healthy cells as well as cancer cells. Report any side effects. Continue your course of treatment even though you feel ill unless your care team tells you to stop. This medication can cause serious side effects. To reduce the risk, your care team may give you other medications to take before receiving this one. Be sure to follow the directions from your care team. This medication can cause a rash or redness in areas of the body that have previously had radiation therapy. If you have had radiation therapy, tell your care team if you notice a rash in this area. This medication may increase your risk of getting an infection. Call your care team for advice if you get a fever, chills, sore throat, or other symptoms of a cold or flu. Do not treat yourself. Try to avoid being around people who are sick. Be careful brushing or flossing your teeth or using a toothpick because you may get an infection or bleed more easily. If you have any dental work done, tell your dentist you are receiving this medication. Avoid taking medications that contain aspirin, acetaminophen, ibuprofen, naproxen, or ketoprofen unless instructed by your care team. These medications may hide a fever. Check with your care team if you have severe diarrhea, nausea, and vomiting, or if you sweat a lot. The loss of too much body fluid may make it dangerous for you to take this medication. Talk to your care team if you or  your partner wish to become pregnant or think either of you might be pregnant. This medication can cause serious birth defects if taken during pregnancy and for 6 months after the last dose. A negative pregnancy test is required before starting this medication. A reliable form of contraception is recommended while taking this medication and for 6 months after the  last dose. Talk to your care team about reliable forms of contraception. Do not father a child while taking this medication and for 3 months after the last dose. Use a condom while having sex during this time period. Do not breastfeed while taking this medication and for 1 week after the last dose. This medication may cause infertility. Talk to your care team if you are concerned about your fertility. What side effects may I notice from receiving this medication? Side effects that you should report to your care team as soon as possible: Allergic reactions--skin rash, itching, hives, swelling of the face, lips, tongue, or throat Dry cough, shortness of breath or trouble breathing Infection--fever, chills, cough, sore throat, wounds that don't heal, pain or trouble when passing urine, general feeling of discomfort or being unwell Kidney injury--decrease in the amount of urine, swelling of the ankles, hands, or feet Low red blood cell level--unusual weakness or fatigue, dizziness, headache, trouble breathing Redness, blistering, peeling, or loosening of the skin, including inside the mouth Unusual bruising or bleeding Side effects that usually do not require medical attention (report to your care team if they continue or are bothersome): Fatigue Loss of appetite Nausea Vomiting This list may not describe all possible side effects. Call your doctor for medical advice about side effects. You may report side effects to FDA at 1-800-FDA-1088. Where should I keep my medication? This medication is given in a hospital or clinic. It will not be stored at home. NOTE: This sheet is a summary. It may not cover all possible information. If you have questions about this medicine, talk to your doctor, pharmacist, or health care provider.  2023 Elsevier/Gold Standard (2021-06-07 00:00:00)

## 2021-11-16 NOTE — Progress Notes (Unsigned)
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  He underwent CT angios chest which did not show any PE.  Soft tissue attenuation measuring 2.9 x 4 cm centered in the left hilum resulting in abrupt angulation and narrowing of the pulmonary arteries and the central left lower lobe airways.  Additional ipsilateral hilar and subcarinal adenopathy.  No contralateral adenopathy.  Consolidative masslike opacity 3.6 x 3.1 cm in size and contiguous with more central perihilar soft tissue attenuation.  Overall findings concerning for primary bronchogenic carcinoma.    Patient lives with his sister who is his Frye caregiver.  He does not drive but is independent of his ADLs.  Reports ongoing fatigue and occasional retrosternal chest pain.  He has been evaluated by GI in the past for reflux as well.  Appetie is fair and weight is stable.  Denies any significant shortness of breath at this time.  Patient is an ex-smoker and smoked for about 20 years but quit smoking back in 1994   PET CT scan showed hypermetabolic mass in the left lower lobe with mediastinal adenopathy and right paratracheal adenopathy.  No evidence of distant metastatic disease.  Pathology was consistent with non-small cell lung cancer favor adenocarcinoma.  Cells were positive for TTF-1 and negative for p40.    Patient started concurrent carbotaxol radiation and then had allergic reaction to Taxol for which she was switched to Abraxane.  Given the short supply of Abraxane patient received carbo Alimta midway through his concurrent chemoradiation.  Scans post chemoradiation showed partial response and patient will be started on maintenance durvalumab on 01/14/2020.  Patient was on maintenance durvalumab and received 15 cycles up until August 2022.  He was noted to have worsening chest pain and was in the ER multiple times in September 2022.  Repeat CT scan showed concern for hilar recurrence.  Patient received palliative radiation to the growing left upper lobe lung massWas given carbo Alimta for 4 cycles and presently on maintenance Alimta  Interval history- Patient denies any chest wall pain. Appetite and weight have remained stable  ECOG PS- 1 Pain scale- 0   Review of systems- Review of Systems  Constitutional:  Negative for chills, fever, malaise/fatigue and weight loss.  HENT:  Negative for congestion, ear discharge and nosebleeds.   Eyes:  Negative for blurred vision.  Respiratory:  Negative for cough, hemoptysis, sputum production, shortness of breath and wheezing.   Cardiovascular:  Negative for chest pain, palpitations, orthopnea and claudication.  Gastrointestinal:  Negative for abdominal pain, blood in stool, constipation, diarrhea, heartburn, melena, nausea and vomiting.  Genitourinary:  Negative for dysuria, flank pain, frequency, hematuria and urgency.  Musculoskeletal:  Negative for back pain, joint pain and myalgias.  Skin:  Negative for rash.  Neurological:  Negative for dizziness, tingling, focal weakness, seizures, weakness and headaches.  Endo/Heme/Allergies:  Does not bruise/bleed easily.  Psychiatric/Behavioral:  Negative  for depression and suicidal ideas. The patient does not have insomnia.       Allergies  Allergen Reactions   Taxol [Paclitaxel] Other (See Comments)     Chest pain, hip pain, coughing , wheezing     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 ([place])      Past Surgical History:  Procedure Laterality Date   ESOPHAGOGASTRODUODENOSCOPY (EGD) WITH PROPOFOL N/A 12/01/2020   Procedure: ESOPHAGOGASTRODUODENOSCOPY (EGD) WITH PROPOFOL;  Surgeon: Lin Landsman, MD;  Location: [facility] ENDOSCOPY;  Service: Gastroenterology;  Laterality: N/A;   PORTA CATH INSERTION N/A 10/22/2019   Procedure: PORTA CATH INSERTION;  Surgeon: Katha Cabal, MD;  Location: [place] CV LAB;  Service: Cardiovascular;  Laterality: N/A;   VIDEO BRONCHOSCOPY WITH ENDOBRONCHIAL ULTRASOUND N/A 09/25/2019   Procedure: VIDEO BRONCHOSCOPY WITH ENDOBRONCHIAL ULTRASOUND;  Surgeon: Tyler Pita, MD;  Location: [facility] ORS;  Service: Pulmonary;  Laterality: N/A;    Social History   Socioeconomic History   Marital status: Single    Spouse name: Not on file   Number of children: Not on file   Years of education: Not on file   Highest education level: Not on file  Occupational History   Not on file  Tobacco Use   Smoking status: Former    Packs/day: 1.00    Years: 20.00    Total pack years: 20.00    Types: Cigarettes    Quit date: 62    Years since quitting: 29.7   Smokeless tobacco: Never  Vaping Use   Vaping Use: Never used  Substance and Sexual Activity   Alcohol use: Not Currently    Comment: not drank any beer in 2 onths   Drug use: Never   Sexual activity: Not Currently  Other Topics Concern   Not on file  Social History Narrative   Not on file   Social Determinants of Health   Financial Resource Strain: Not on file  Food Insecurity: Not on file  Transportation Needs: Not on file  Physical Activity: Not on file  Stress: Not on file  Social Connections: Not on file  Intimate Partner Violence: Not on file    Family History  Problem Relation  Age of Onset   Cancer Brother      Current Outpatient Medications:    albuterol (VENTOLIN HFA) 108 (90 Base) MCG/ACT inhaler, Inhale 2 puffs into the lungs every 4 (four) hours as needed for wheezing or shortness of breath., Disp: 8 g, Rfl: 1   ANORO ELLIPTA 62.5-25 MCG/ACT AEPB, INHALE 1 PUFF INTO THE LUNGS DAILY, Disp: 60 each, Rfl: 2   Budeson-Glycopyrrol-Formoterol (BREZTRI AEROSPHERE) 160-9-4.8 MCG/ACT AERO, Inhale 2 puffs into the lungs in the morning and at bedtime., Disp: 52.8 g, Rfl: 5   folic acid (FOLVITE) 1 MG tablet, Take 1 tablet (1 mg total) by mouth daily., Disp: 30 tablet, Rfl: 3   lidocaine-prilocaine (EMLA) cream, Apply 1 application topically as needed., Disp: 30 g, Rfl: 3   acetaminophen (TYLENOL) 500 MG tablet, Take 1,000 mg by mouth every 6 (six) hours as needed for mild pain, moderate pain or headache. (Patient not taking: Reported on 11/12/2021), Disp: , Rfl:    FLOVENT HFA 220 MCG/ACT inhaler, Inhale 2 puffs into the lungs 2 (two) times daily. (Patient not taking: Reported on 05/07/2021), Disp: , Rfl:    lidocaine (LIDODERM) 5 %,  Place 1 patch onto the skin daily. Remove & Discard patch within 12 hours or as directed by MD (Patient not taking: Reported on 05/07/2021), Disp: 30 patch, Rfl: 0   naloxegol oxalate (MOVANTIK) 25 MG TABS tablet, Take 1 tablet (25 mg total) by mouth daily. (Patient not taking: Reported on 05/07/2021), Disp: 30 tablet, Rfl: 2   potassium chloride SA (KLOR-CON) 20 MEQ tablet, TAKE 1 TABLET BY MOUTH TWICE DAILY (Patient not taking: Reported on 05/07/2021), Disp: 30 tablet, Rfl: 1  Physical exam:  Vitals:   11/12/21 1344  BP: 133/77  Pulse: 72  Resp: 16  Temp: 97.7 F (36.5 C)  TempSrc: Oral  Weight: 172 lb 4.8 oz (78.2 kg)  Height: 5\' 10"  (1.778 m)   Physical Exam Constitutional:      General: He is not in acute distress. Cardiovascular:     Rate and Rhythm: Normal rate and regular rhythm.     Heart sounds: Normal heart sounds.   Pulmonary:     Effort: Pulmonary effort is normal.     Breath sounds: Normal breath sounds.  Abdominal:     General: Bowel sounds are normal.     Palpations: Abdomen is soft.  Skin:    General: Skin is warm and dry.  Neurological:     Mental Status: He is alert and oriented to person, place, and time.         Latest Ref Rng & Units 11/12/2021    1:09 PM  CMP  Glucose 70 - 99 mg/dL 95   BUN 8 - 23 mg/dL 17   Creatinine 0.61 - 1.24 mg/dL 1.03   Sodium 135 - 145 mmol/L 138   Potassium 3.5 - 5.1 mmol/L 3.8   Chloride 98 - 111 mmol/L 111   CO2 22 - 32 mmol/L 24   Calcium 8.9 - 10.3 mg/dL 8.7   Total Protein 6.5 - 8.1 g/dL 7.5   Total Bilirubin 0.3 - 1.2 mg/dL 0.5   Alkaline Phos 38 - 126 U/L 81   AST 15 - 41 U/L 20   ALT 0 - 44 U/L 8       Latest Ref Rng & Units 11/12/2021    1:09 PM  CBC  WBC 4.0 - 10.5 K/uL 6.0   Hemoglobin 13.0 - 17.0 g/dL 9.1   Hematocrit 39.0 - 52.0 % 29.3   Platelets 150 - 400 K/uL 512      Assessment and plan- Patient is a 71 y.o. male  with recurrent adenocarcinoma of the lung. He is here for on treatment assessment prior to cycle 17 of maintenance alimta  Okay to proceed with cycle 17 of maintenance Alimta today.  He will also get his B12 injection today.  I will see him back in 6 weeks for cycle 19 and he will see covering NP in 3 weeks for cycle 18.  Plan to repeat CT chest abdomen pelvis with contrast in about 6 weeks from now.   Visit Diagnosis 1. Malignant neoplasm of lower lobe of left lung ([place])   2. Encounter for antineoplastic chemotherapy      Dr. Randa Evens, MD, MPH [facility] at [facility] [phone] 11/16/2021 3:59 PM

## 2021-11-18 ENCOUNTER — Encounter: Payer: Self-pay | Admitting: Oncology

## 2021-11-19 ENCOUNTER — Encounter: Payer: Self-pay | Admitting: Oncology

## 2021-11-26 ENCOUNTER — Encounter: Payer: Self-pay | Admitting: Oncology

## 2021-12-03 ENCOUNTER — Inpatient Hospital Stay (HOSPITAL_BASED_OUTPATIENT_CLINIC_OR_DEPARTMENT_OTHER): Payer: Medicare Other | Admitting: Nurse Practitioner

## 2021-12-03 ENCOUNTER — Inpatient Hospital Stay: Payer: Medicare Other

## 2021-12-03 ENCOUNTER — Inpatient Hospital Stay: Payer: Medicare Other | Attending: Radiation Oncology

## 2021-12-03 VITALS — BP 140/89 | HR 80 | Temp 96.9°F | Resp 16 | Wt 172.0 lb

## 2021-12-03 DIAGNOSIS — E538 Deficiency of other specified B group vitamins: Secondary | ICD-10-CM

## 2021-12-03 DIAGNOSIS — C3432 Malignant neoplasm of lower lobe, left bronchus or lung: Secondary | ICD-10-CM

## 2021-12-03 DIAGNOSIS — D75839 Thrombocytosis, unspecified: Secondary | ICD-10-CM | POA: Insufficient documentation

## 2021-12-03 DIAGNOSIS — E785 Hyperlipidemia, unspecified: Secondary | ICD-10-CM | POA: Insufficient documentation

## 2021-12-03 DIAGNOSIS — Z5111 Encounter for antineoplastic chemotherapy: Secondary | ICD-10-CM | POA: Diagnosis present

## 2021-12-03 DIAGNOSIS — I10 Essential (primary) hypertension: Secondary | ICD-10-CM | POA: Insufficient documentation

## 2021-12-03 DIAGNOSIS — Z87891 Personal history of nicotine dependence: Secondary | ICD-10-CM | POA: Diagnosis not present

## 2021-12-03 DIAGNOSIS — Z923 Personal history of irradiation: Secondary | ICD-10-CM | POA: Diagnosis not present

## 2021-12-03 DIAGNOSIS — D649 Anemia, unspecified: Secondary | ICD-10-CM | POA: Insufficient documentation

## 2021-12-03 LAB — CBC WITH DIFFERENTIAL/PLATELET
Abs Immature Granulocytes: 0.03 10*3/uL (ref 0.00–0.07)
Basophils Absolute: 0 10*3/uL (ref 0.0–0.1)
Basophils Relative: 0 %
Eosinophils Absolute: 0 10*3/uL (ref 0.0–0.5)
Eosinophils Relative: 1 %
HCT: 31.3 % — ABNORMAL LOW (ref 39.0–52.0)
Hemoglobin: 9.4 g/dL — ABNORMAL LOW (ref 13.0–17.0)
Immature Granulocytes: 1 %
Lymphocytes Relative: 20 %
Lymphs Abs: 0.9 10*3/uL (ref 0.7–4.0)
MCH: 27.5 pg (ref 26.0–34.0)
MCHC: 30 g/dL (ref 30.0–36.0)
MCV: 91.5 fL (ref 80.0–100.0)
Monocytes Absolute: 0.6 10*3/uL (ref 0.1–1.0)
Monocytes Relative: 14 %
Neutro Abs: 2.9 10*3/uL (ref 1.7–7.7)
Neutrophils Relative %: 64 %
Platelets: 540 10*3/uL — ABNORMAL HIGH (ref 150–400)
RBC: 3.42 MIL/uL — ABNORMAL LOW (ref 4.22–5.81)
RDW: 19 % — ABNORMAL HIGH (ref 11.5–15.5)
WBC: 4.4 10*3/uL (ref 4.0–10.5)
nRBC: 0 % (ref 0.0–0.2)

## 2021-12-03 LAB — COMPREHENSIVE METABOLIC PANEL
ALT: 8 U/L (ref 0–44)
AST: 20 U/L (ref 15–41)
Albumin: 3.1 g/dL — ABNORMAL LOW (ref 3.5–5.0)
Alkaline Phosphatase: 78 U/L (ref 38–126)
Anion gap: 6 (ref 5–15)
BUN: 11 mg/dL (ref 8–23)
CO2: 22 mmol/L (ref 22–32)
Calcium: 8.8 mg/dL — ABNORMAL LOW (ref 8.9–10.3)
Chloride: 111 mmol/L (ref 98–111)
Creatinine, Ser: 1.15 mg/dL (ref 0.61–1.24)
GFR, Estimated: >60 mL/min (ref >60)
Glucose, Bld: 121 mg/dL — ABNORMAL HIGH (ref 70–99)
Potassium: 4.1 mmol/L (ref 3.5–5.1)
Sodium: 139 mmol/L (ref 135–145)
Total Bilirubin: 0.5 mg/dL (ref 0.3–1.2)
Total Protein: 7.5 g/dL (ref 6.5–8.1)

## 2021-12-03 MED ORDER — SODIUM CHLORIDE 0.9% FLUSH
10.0000 mL | Freq: Once | INTRAVENOUS | Status: AC
  Administered 2021-12-03: 10 mL via INTRAVENOUS
  Filled 2021-12-03: qty 10

## 2021-12-03 MED ORDER — SODIUM CHLORIDE 0.9 % IV SOLN
500.0000 mg/m2 | Freq: Once | INTRAVENOUS | Status: AC
  Administered 2021-12-03: 1000 mg via INTRAVENOUS
  Filled 2021-12-03: qty 40

## 2021-12-03 MED ORDER — PROCHLORPERAZINE MALEATE 10 MG PO TABS
10.0000 mg | ORAL_TABLET | Freq: Once | ORAL | Status: AC
  Administered 2021-12-03: 10 mg via ORAL
  Filled 2021-12-03: qty 1

## 2021-12-03 MED ORDER — HEPARIN SOD (PORK) LOCK FLUSH 100 UNIT/ML IV SOLN
500.0000 [IU] | Freq: Once | INTRAVENOUS | Status: AC | PRN
  Administered 2021-12-03: 500 [IU]
  Filled 2021-12-03: qty 5

## 2021-12-03 MED ORDER — CYANOCOBALAMIN 1000 MCG/ML IJ SOLN
1000.0000 ug | INTRAMUSCULAR | Status: DC
  Administered 2021-12-03: 1000 ug via INTRAMUSCULAR
  Filled 2021-12-03: qty 1

## 2021-12-03 MED ORDER — SODIUM CHLORIDE 0.9 % IV SOLN
Freq: Once | INTRAVENOUS | Status: AC
  Filled 2021-12-03: qty 250

## 2021-12-03 NOTE — Progress Notes (Signed)
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  He underwent CT angios chest which did not show any PE.  Soft tissue attenuation measuring 2.9 x 4 cm centered in the left hilum resulting in abrupt angulation and narrowing of the pulmonary arteries and the central left lower lobe airways.  Additional ipsilateral hilar and subcarinal adenopathy.  No contralateral adenopathy.  Consolidative masslike opacity 3.6 x 3.1 cm in size and contiguous with more central perihilar soft tissue attenuation.  Overall findings concerning for primary bronchogenic carcinoma.    Patient lives with his sister who is his main caregiver.  He does not drive but is independent of his ADLs.  Reports ongoing fatigue and occasional retrosternal chest pain.  He has been evaluated by GI in the past for reflux as well.  Appetie is fair and weight is stable.  Denies any significant shortness of breath at this time.  Patient is an ex-smoker and smoked for about 20 years but quit smoking back in 1994   PET CT scan showed hypermetabolic mass in the left lower lobe with mediastinal adenopathy and right paratracheal adenopathy.  No evidence of distant metastatic disease.  Pathology was consistent with non-small cell lung cancer favor adenocarcinoma.  Cells were positive for TTF-1 and negative for p40.    Patient started concurrent carbotaxol radiation and then had allergic reaction to Taxol for which she was switched to Abraxane.  Given the short supply of Abraxane patient received carbo Alimta midway through his concurrent chemoradiation.  Scans post chemoradiation showed partial response and patient will be started on maintenance durvalumab on 01/14/2020.  Patient was on maintenance durvalumab and received 15 cycles up until August 2022.  He was noted to have worsening chest pain and was in the ER multiple times in September 2022.  Repeat CT scan showed concern for hilar recurrence.  Patient received palliative radiation to the growing left upper lobe lung massWas given carbo Alimta for 4 cycles and presently on maintenance Alimta  Interval history- Patient denies any chest wall pain. Appetite and weight have remained stable  ECOG PS- 1 Pain scale- 0   Review of systems- Review of Systems  Constitutional:  Negative for chills, fever, malaise/fatigue and weight loss.  HENT:  Negative for congestion, ear discharge and nosebleeds.   Eyes:  Negative for blurred vision.  Respiratory:  Negative for cough, hemoptysis, sputum production, shortness of breath and wheezing.   Cardiovascular:  Negative for chest pain, palpitations, orthopnea and claudication.  Gastrointestinal:  Negative for abdominal pain, blood in stool, constipation, diarrhea, heartburn, melena, nausea and vomiting.  Genitourinary:  Negative for dysuria, flank pain, frequency, hematuria and urgency.  Musculoskeletal:  Negative for back pain, joint pain and myalgias.  Skin:  Negative for rash.  Neurological:  Negative for dizziness, tingling, focal weakness, seizures, weakness and headaches.  Endo/Heme/Allergies:  Does not bruise/bleed easily.  Psychiatric/Behavioral:  Negative for depression and  suicidal ideas. The patient does not have insomnia.       Allergies  Allergen Reactions   Taxol [Paclitaxel] Other (See Comments)     Chest pain, hip pain, coughing , wheezing     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 ([place])      Past Surgical History:  Procedure Laterality Date   ESOPHAGOGASTRODUODENOSCOPY (EGD) WITH PROPOFOL N/A 12/01/2020   Procedure: ESOPHAGOGASTRODUODENOSCOPY (EGD) WITH PROPOFOL;  Surgeon: Lin Landsman, MD;  Location: [facility] ENDOSCOPY;  Service: Gastroenterology;  Laterality: N/A;   PORTA CATH INSERTION N/A 10/22/2019   Procedure: PORTA CATH INSERTION;  Surgeon: Katha Cabal, MD;  Location: [place] CV LAB;  Service: Cardiovascular;  Laterality: N/A;   VIDEO BRONCHOSCOPY WITH ENDOBRONCHIAL ULTRASOUND N/A 09/25/2019   Procedure: VIDEO BRONCHOSCOPY WITH ENDOBRONCHIAL ULTRASOUND;  Surgeon: Tyler Pita, MD;  Location: [facility] ORS;  Service: Pulmonary;  Laterality: N/A;    Social History   Socioeconomic History   Marital status: Single    Spouse name: Not on file   Number of children: Not on file   Years of education: Not on file   Highest education level: Not on file  Occupational History   Not on file  Tobacco Use   Smoking status: Former    Packs/day: 1.00    Years: 20.00    Total pack years: 20.00    Types: Cigarettes    Quit date: 37    Years since quitting: 29.8   Smokeless tobacco: Never  Vaping Use   Vaping Use: Never used  Substance and Sexual Activity   Alcohol use: Not Currently    Comment: not drank any beer in 2 onths   Drug use: Never   Sexual activity: Not Currently  Other Topics Concern   Not on file  Social History Narrative   Not on file   Social Determinants of Health   Financial Resource Strain: Not on file  Food Insecurity: Not on file  Transportation Needs: Not on file  Physical Activity: Not on file  Stress: Not on file  Social Connections: Not on file  Intimate Partner Violence: Not on file    Family History  Problem Relation  Age of Onset   Cancer Brother      Current Outpatient Medications:    albuterol (VENTOLIN HFA) 108 (90 Base) MCG/ACT inhaler, Inhale 2 puffs into the lungs every 4 (four) hours as needed for wheezing or shortness of breath., Disp: 8 g, Rfl: 1   ANORO ELLIPTA 62.5-25 MCG/ACT AEPB, INHALE 1 PUFF INTO THE LUNGS DAILY, Disp: 60 each, Rfl: 2   Budeson-Glycopyrrol-Formoterol (BREZTRI AEROSPHERE) 160-9-4.8 MCG/ACT AERO, Inhale 2 puffs into the lungs in the morning and at bedtime., Disp: 10.7 g, Rfl: 5   FLOVENT HFA 220 MCG/ACT inhaler, Inhale 2 puffs into the lungs 2 (two) times daily., Disp: , Rfl:    folic acid (FOLVITE) 1 MG tablet, Take 1 tablet (1 mg total) by mouth daily., Disp: 30 tablet, Rfl: 3   lidocaine-prilocaine (EMLA) cream, Apply 1 application topically as needed., Disp: 30 g, Rfl: 3   acetaminophen (TYLENOL) 500 MG tablet, Take 1,000 mg by mouth every 6 (six) hours as needed for mild pain, moderate pain or headache. (Patient not taking: Reported on 11/12/2021), Disp: , Rfl:    lidocaine (LIDODERM) 5 %, Place 1 patch onto the skin daily. Remove &  Discard patch within 12 hours or as directed by MD (Patient not taking: Reported on 05/07/2021), Disp: 30 patch, Rfl: 0   naloxegol oxalate (MOVANTIK) 25 MG TABS tablet, Take 1 tablet (25 mg total) by mouth daily. (Patient not taking: Reported on 05/07/2021), Disp: 30 tablet, Rfl: 2   potassium chloride SA (KLOR-CON) 20 MEQ tablet, TAKE 1 TABLET BY MOUTH TWICE DAILY (Patient not taking: Reported on 05/07/2021), Disp: 30 tablet, Rfl: 1  Physical exam:  Vitals:   12/03/21 1005  BP: (!) 140/89  Pulse: 80  Resp: 16  Temp: (!) 96.9 F (36.1 C)  TempSrc: Tympanic  SpO2: 100%  Weight: 172 lb (78 kg)   Physical Exam Constitutional:      General: He is not in acute distress. Cardiovascular:     Rate and Rhythm: Normal rate and regular rhythm.     Heart sounds: Normal heart sounds.  Pulmonary:     Effort: Pulmonary effort is normal.     Breath  sounds: Normal breath sounds.  Abdominal:     General: Bowel sounds are normal.     Palpations: Abdomen is soft.  Skin:    General: Skin is warm and dry.  Neurological:     Mental Status: He is alert and oriented to person, place, and time.        Latest Ref Rng & Units 12/03/2021    9:51 AM  CMP  Glucose 70 - 99 mg/dL 121   BUN 8 - 23 mg/dL 11   Creatinine 0.61 - 1.24 mg/dL 1.15   Sodium 135 - 145 mmol/L 139   Potassium 3.5 - 5.1 mmol/L 4.1   Chloride 98 - 111 mmol/L 111   CO2 22 - 32 mmol/L 22   Calcium 8.9 - 10.3 mg/dL 8.8   Total Protein 6.5 - 8.1 g/dL 7.5   Total Bilirubin 0.3 - 1.2 mg/dL 0.5   Alkaline Phos 38 - 126 U/L 78   AST 15 - 41 U/L 20   ALT 0 - 44 U/L 8       Latest Ref Rng & Units 12/03/2021    9:51 AM  CBC  WBC 4.0 - 10.5 K/uL 4.4   Hemoglobin 13.0 - 17.0 g/dL 9.4   Hematocrit 39.0 - 52.0 % 31.3   Platelets 150 - 400 K/uL 540      Assessment and plan- Patient is a 71 y.o. male  with recurrent adenocarcinoma of the lung. He is here for on treatment assessment prior to cycle 18 of maintenance alimta  Okay to proceed with cycle 18 of maintenance Alimta today.  He will also get his B12 injection today.  Reports compliance with folic acid.   Anemia- hmg 9.4. Stable.   Thrombocytosis- plt 540. Question reactive. Plan to check iron studies at next visit in setting of anemia.   Plan to repeat CT chest abdomen pelvis with contrast as scheduled.   Disposition:  RTC as scheduled.    Visit Diagnosis 1. Malignant neoplasm of lower lobe of left lung ([place])    Beckey Rutter, DNP, AGNP-C [place] at [facility] [phone] (clinic) 12/03/2021

## 2021-12-17 ENCOUNTER — Inpatient Hospital Stay: Payer: Medicare Other | Attending: Radiation Oncology

## 2021-12-17 ENCOUNTER — Ambulatory Visit
Admission: RE | Admit: 2021-12-17 | Discharge: 2021-12-17 | Disposition: A | Discharge status: Home / Self Care | Payer: Medicare Other | Source: Ambulatory Visit | Attending: Oncology | Admitting: Oncology

## 2021-12-17 DIAGNOSIS — C3431 Malignant neoplasm of lower lobe, right bronchus or lung: Secondary | ICD-10-CM | POA: Insufficient documentation

## 2021-12-17 DIAGNOSIS — C3432 Malignant neoplasm of lower lobe, left bronchus or lung: Secondary | ICD-10-CM | POA: Insufficient documentation

## 2021-12-17 DIAGNOSIS — Z79899 Other long term (current) drug therapy: Secondary | ICD-10-CM | POA: Insufficient documentation

## 2021-12-17 DIAGNOSIS — R590 Localized enlarged lymph nodes: Secondary | ICD-10-CM | POA: Insufficient documentation

## 2021-12-17 DIAGNOSIS — R072 Precordial pain: Secondary | ICD-10-CM | POA: Insufficient documentation

## 2021-12-17 DIAGNOSIS — E785 Hyperlipidemia, unspecified: Secondary | ICD-10-CM | POA: Insufficient documentation

## 2021-12-17 DIAGNOSIS — Z87891 Personal history of nicotine dependence: Secondary | ICD-10-CM | POA: Insufficient documentation

## 2021-12-17 DIAGNOSIS — Z5111 Encounter for antineoplastic chemotherapy: Secondary | ICD-10-CM | POA: Insufficient documentation

## 2021-12-17 DIAGNOSIS — I10 Essential (primary) hypertension: Secondary | ICD-10-CM | POA: Insufficient documentation

## 2021-12-17 DIAGNOSIS — Z7951 Long term (current) use of inhaled steroids: Secondary | ICD-10-CM | POA: Insufficient documentation

## 2021-12-17 MED ORDER — IOHEXOL 300 MG/ML  SOLN
100.0000 mL | Freq: Once | INTRAMUSCULAR | Status: AC | PRN
  Administered 2021-12-17: 100 mL via INTRAVENOUS

## 2021-12-24 ENCOUNTER — Encounter: Payer: Self-pay | Admitting: Oncology

## 2021-12-24 ENCOUNTER — Inpatient Hospital Stay (HOSPITAL_BASED_OUTPATIENT_CLINIC_OR_DEPARTMENT_OTHER): Payer: Medicare Other | Admitting: Oncology

## 2021-12-24 ENCOUNTER — Inpatient Hospital Stay: Payer: Medicare Other

## 2021-12-24 VITALS — Temp 98.0°F

## 2021-12-24 VITALS — BP 142/80 | HR 70 | Resp 18 | Wt 174.9 lb

## 2021-12-24 DIAGNOSIS — Z5111 Encounter for antineoplastic chemotherapy: Secondary | ICD-10-CM

## 2021-12-24 DIAGNOSIS — E785 Hyperlipidemia, unspecified: Secondary | ICD-10-CM | POA: Diagnosis not present

## 2021-12-24 DIAGNOSIS — Z87891 Personal history of nicotine dependence: Secondary | ICD-10-CM | POA: Diagnosis not present

## 2021-12-24 DIAGNOSIS — Z7951 Long term (current) use of inhaled steroids: Secondary | ICD-10-CM | POA: Diagnosis not present

## 2021-12-24 DIAGNOSIS — C3432 Malignant neoplasm of lower lobe, left bronchus or lung: Secondary | ICD-10-CM

## 2021-12-24 DIAGNOSIS — I10 Essential (primary) hypertension: Secondary | ICD-10-CM | POA: Diagnosis not present

## 2021-12-24 DIAGNOSIS — C3431 Malignant neoplasm of lower lobe, right bronchus or lung: Secondary | ICD-10-CM | POA: Diagnosis not present

## 2021-12-24 DIAGNOSIS — R590 Localized enlarged lymph nodes: Secondary | ICD-10-CM | POA: Diagnosis not present

## 2021-12-24 DIAGNOSIS — Z79899 Other long term (current) drug therapy: Secondary | ICD-10-CM | POA: Diagnosis not present

## 2021-12-24 DIAGNOSIS — R072 Precordial pain: Secondary | ICD-10-CM | POA: Diagnosis not present

## 2021-12-24 LAB — COMPREHENSIVE METABOLIC PANEL
ALT: 8 U/L (ref 0–44)
AST: 20 U/L (ref 15–41)
Albumin: 3.1 g/dL — ABNORMAL LOW (ref 3.5–5.0)
Alkaline Phosphatase: 73 U/L (ref 38–126)
Anion gap: 7 (ref 5–15)
BUN: 12 mg/dL (ref 8–23)
CO2: 23 mmol/L (ref 22–32)
Calcium: 8.6 mg/dL — ABNORMAL LOW (ref 8.9–10.3)
Chloride: 110 mmol/L (ref 98–111)
Creatinine, Ser: 1.07 mg/dL (ref 0.61–1.24)
GFR, Estimated: >60 mL/min (ref >60)
Glucose, Bld: 103 mg/dL — ABNORMAL HIGH (ref 70–99)
Potassium: 3.6 mmol/L (ref 3.5–5.1)
Sodium: 140 mmol/L (ref 135–145)
Total Bilirubin: 0.3 mg/dL (ref 0.3–1.2)
Total Protein: 7.2 g/dL (ref 6.5–8.1)

## 2021-12-24 LAB — CBC WITH DIFFERENTIAL/PLATELET
Abs Immature Granulocytes: 0.03 10*3/uL (ref 0.00–0.07)
Basophils Absolute: 0 10*3/uL (ref 0.0–0.1)
Basophils Relative: 0 %
Eosinophils Absolute: 0 10*3/uL (ref 0.0–0.5)
Eosinophils Relative: 1 %
HCT: 30.8 % — ABNORMAL LOW (ref 39.0–52.0)
Hemoglobin: 9.4 g/dL — ABNORMAL LOW (ref 13.0–17.0)
Immature Granulocytes: 1 %
Lymphocytes Relative: 16 %
Lymphs Abs: 1 10*3/uL (ref 0.7–4.0)
MCH: 28.1 pg (ref 26.0–34.0)
MCHC: 30.5 g/dL (ref 30.0–36.0)
MCV: 91.9 fL (ref 80.0–100.0)
Monocytes Absolute: 0.8 10*3/uL (ref 0.1–1.0)
Monocytes Relative: 12 %
Neutro Abs: 4.8 10*3/uL (ref 1.7–7.7)
Neutrophils Relative %: 70 %
Platelets: 491 10*3/uL — ABNORMAL HIGH (ref 150–400)
RBC: 3.35 MIL/uL — ABNORMAL LOW (ref 4.22–5.81)
RDW: 18.7 % — ABNORMAL HIGH (ref 11.5–15.5)
WBC: 6.6 10*3/uL (ref 4.0–10.5)
nRBC: 0 % (ref 0.0–0.2)

## 2021-12-24 LAB — IRON AND TIBC
Iron: 37 ug/dL — ABNORMAL LOW (ref 45–182)
Saturation Ratios: 13 % — ABNORMAL LOW (ref 17.9–39.5)
TIBC: 290 ug/dL (ref 250–450)
UIBC: 253 ug/dL

## 2021-12-24 LAB — FERRITIN: Ferritin: 78 ng/mL (ref 24–336)

## 2021-12-24 MED ORDER — HEPARIN SOD (PORK) LOCK FLUSH 100 UNIT/ML IV SOLN
INTRAVENOUS | Status: AC
  Filled 2021-12-24: qty 5

## 2021-12-24 MED ORDER — SODIUM CHLORIDE 0.9% FLUSH
10.0000 mL | INTRAVENOUS | Status: DC | PRN
  Administered 2021-12-24: 10 mL
  Filled 2021-12-24: qty 10

## 2021-12-24 MED ORDER — PROCHLORPERAZINE MALEATE 10 MG PO TABS
10.0000 mg | ORAL_TABLET | Freq: Once | ORAL | Status: AC
  Administered 2021-12-24: 10 mg via ORAL
  Filled 2021-12-24: qty 1

## 2021-12-24 MED ORDER — SODIUM CHLORIDE 0.9 % IV SOLN
500.0000 mg/m2 | Freq: Once | INTRAVENOUS | Status: AC
  Administered 2021-12-24: 1000 mg via INTRAVENOUS
  Filled 2021-12-24: qty 40

## 2021-12-24 MED ORDER — SODIUM CHLORIDE 0.9 % IV SOLN
Freq: Once | INTRAVENOUS | Status: AC
  Filled 2021-12-24: qty 250

## 2021-12-24 MED ORDER — HEPARIN SOD (PORK) LOCK FLUSH 100 UNIT/ML IV SOLN
500.0000 [IU] | Freq: Once | INTRAVENOUS | Status: AC | PRN
  Administered 2021-12-24: 500 [IU]
  Filled 2021-12-24: qty 5

## 2021-12-24 NOTE — Progress Notes (Signed)
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  He underwent CT angios chest which did not show any PE.  Soft tissue attenuation measuring 2.9 x 4 cm centered in the left hilum resulting in abrupt angulation and narrowing of the pulmonary arteries and the central left lower lobe airways.  Additional ipsilateral hilar and subcarinal adenopathy.  No contralateral adenopathy.  Consolidative masslike opacity 3.6 x 3.1 cm in size and contiguous with more central perihilar soft tissue attenuation.  Overall findings concerning for primary bronchogenic carcinoma.    Patient lives with his sister who is his main caregiver.  He does not drive but is independent of his ADLs.  Reports ongoing fatigue and occasional retrosternal chest pain.  He has been evaluated by GI in the past for reflux as well.  Appetie is fair and weight is stable.  Denies any significant shortness of breath at this time.  Patient is an ex-smoker and smoked for about 20 years but quit smoking back in 1994   PET CT scan showed hypermetabolic mass in the left lower lobe with mediastinal adenopathy and right paratracheal adenopathy.  No evidence of distant metastatic disease.  Pathology was consistent with non-small cell lung cancer favor adenocarcinoma.  Cells were positive for TTF-1 and negative for p40.    Patient started concurrent carbotaxol radiation and then had allergic reaction to Taxol for which she was switched to Abraxane.  Given the short supply of Abraxane patient received carbo Alimta midway through his concurrent chemoradiation.  Scans post chemoradiation showed partial response and patient will be started on maintenance durvalumab on 01/14/2020.  Patient was on maintenance durvalumab and received 15 cycles up until August 2022.  He was noted to have worsening chest pain and was in the ER multiple times in September 2022.  Repeat CT scan showed concern for hilar recurrence.  Patient received palliative radiation to the growing left upper lobe lung massWas given carbo Alimta for 4 cycles and presently on maintenance Alimta  Interval history-tolerating treatments well so far.  Denies any chest wall pain, cough or shortness of breath.  ECOG PS- 1 Pain scale- 0   Review of systems- Review of Systems  Constitutional:  Positive for malaise/fatigue. Negative for chills, fever and weight loss.  HENT:  Negative for congestion, ear discharge and nosebleeds.   Eyes:  Negative for blurred vision.  Respiratory:  Negative for cough, hemoptysis, sputum production, shortness of breath and wheezing.   Cardiovascular:  Negative for chest pain, palpitations, orthopnea and claudication.  Gastrointestinal:  Negative for abdominal pain, blood in stool, constipation, diarrhea, heartburn, melena, nausea and vomiting.  Genitourinary:  Negative for dysuria, flank pain, frequency, hematuria and urgency.  Musculoskeletal:  Negative for back pain, joint pain and myalgias.  Skin:  Negative for rash.  Neurological:  Negative for dizziness, tingling, focal weakness, seizures, weakness and headaches.  Endo/Heme/Allergies:  Does not bruise/bleed easily.  Psychiatric/Behavioral:  Negative for depression and suicidal ideas. The patient does not have insomnia.       Allergies  Allergen Reactions   Taxol  [Paclitaxel] Other (See Comments)    Chest pain, hip pain, coughing , wheezing     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 ([place])      Past Surgical History:  Procedure Laterality Date   ESOPHAGOGASTRODUODENOSCOPY (EGD) WITH PROPOFOL N/A 12/01/2020   Procedure: ESOPHAGOGASTRODUODENOSCOPY (EGD) WITH PROPOFOL;  Surgeon: Lin Landsman, MD;  Location: [facility] ENDOSCOPY;  Service: Gastroenterology;  Laterality: N/A;   PORTA CATH INSERTION N/A 10/22/2019   Procedure: PORTA CATH INSERTION;  Surgeon: Katha Cabal, MD;  Location: [place] [facility];  Service: Cardiovascular;  Laterality: N/A;   VIDEO BRONCHOSCOPY WITH ENDOBRONCHIAL ULTRASOUND N/A 09/25/2019   Procedure: VIDEO BRONCHOSCOPY WITH ENDOBRONCHIAL ULTRASOUND;  Surgeon: Tyler Pita, MD;  Location: [facility] ORS;  Service: Pulmonary;  Laterality: N/A;    Social History   Socioeconomic History   Marital status: Single    Spouse name: Not on file   Number of children: Not on file   Years of education: Not on file   Highest education level: Not on file  Occupational History   Not on file  Tobacco Use   Smoking status: Former    Packs/day: 1.00    Years: 20.00    Total pack years: 20.00    Types: Cigarettes    Quit date: 51    Years since quitting: 29.8   Smokeless tobacco: Never  Vaping Use   Vaping Use: Never used  Substance and Sexual Activity   Alcohol use: Not Currently    Comment: not drank any beer in 2 onths   Drug use: Never   Sexual activity: Not Currently  Other Topics Concern   Not on file  Social History Narrative   Not on file   Social Determinants of Health   Financial Resource Strain: Not on file  Food Insecurity: Not on file  Transportation Needs: Unmet Transportation Needs (12/24/2021)   PRAPARE - Hydrologist (Medical): Yes    Lack of Transportation  (Non-Medical): Not on file  Physical Activity: Not on file  Stress: Not on file  Social Connections: Not on file  Intimate Partner Violence: Not on file    Family History  Problem Relation Age of Onset   Cancer Brother      Current Outpatient Medications:    acetaminophen (TYLENOL) 500 MG tablet, Take 1,000 mg by mouth every 6 (six) hours as needed for mild pain, moderate pain or headache., Disp: , Rfl:    albuterol (VENTOLIN HFA) 108 (90 Base) MCG/ACT inhaler, Inhale 2 puffs into the lungs every 4 (four) hours as needed for wheezing or shortness of breath., Disp: 8 g, Rfl: 1   ANORO ELLIPTA 62.5-25 MCG/ACT AEPB, INHALE 1 PUFF INTO THE LUNGS DAILY, Disp: 60 each, Rfl: 2   Budeson-Glycopyrrol-Formoterol (BREZTRI AEROSPHERE) 160-9-4.8 MCG/ACT AERO, Inhale 2 puffs into the lungs in the morning and at bedtime., Disp: 10.7 g, Rfl: 5   FLOVENT HFA 220 MCG/ACT inhaler, Inhale 2 puffs into the lungs 2 (two) times daily., Disp: , Rfl:    folic acid (FOLVITE) 1 MG tablet, Take 1 tablet (1 mg total) by mouth daily., Disp: 30 tablet, Rfl: 3   lidocaine-prilocaine (EMLA) cream, Apply 1  application topically as needed., Disp: 30 g, Rfl: 3   lidocaine (LIDODERM) 5 %, Place 1 patch onto the skin daily. Remove & Discard patch within 12 hours or as directed by MD (Patient not taking: Reported on 05/07/2021), Disp: 30 patch, Rfl: 0   naloxegol oxalate (MOVANTIK) 25 MG TABS tablet, Take 1 tablet (25 mg total) by mouth daily. (Patient not taking: Reported on 05/07/2021), Disp: 30 tablet, Rfl: 2   potassium chloride SA (KLOR-CON) 20 MEQ tablet, TAKE 1 TABLET BY MOUTH TWICE DAILY (Patient not taking: Reported on 05/07/2021), Disp: 30 tablet, Rfl: 1 No current facility-administered medications for this visit.  Facility-Administered Medications Ordered in Other Visits:    heparin lock flush 100 UNIT/ML injection, , , ,    sodium chloride flush (NS) 0.9 % injection 10 mL, 10 mL, Intracatheter, PRN, Sindy Guadeloupe,  MD, 10 mL at 12/24/21 1537  Physical exam:  Vitals:   12/24/21 1411  BP: (!) 142/80  Pulse: 70  Resp: 18  SpO2: 100%  Weight: 174 lb 14.4 oz (79.3 kg)   Physical Exam Constitutional:      General: He is not in acute distress. Cardiovascular:     Rate and Rhythm: Normal rate and regular rhythm.     Heart sounds: Normal heart sounds.  Pulmonary:     Effort: Pulmonary effort is normal.     Breath sounds: Normal breath sounds.  Abdominal:     General: Bowel sounds are normal.     Palpations: Abdomen is soft.  Skin:    General: Skin is warm and dry.  Neurological:     Mental Status: He is alert and oriented to person, place, and time.         Latest Ref Rng & Units 12/24/2021    1:24 PM  CMP  Glucose 70 - 99 mg/dL 103   BUN 8 - 23 mg/dL 12   Creatinine 0.61 - 1.24 mg/dL 1.07   Sodium 135 - 145 mmol/L 140   Potassium 3.5 - 5.1 mmol/L 3.6   Chloride 98 - 111 mmol/L 110   CO2 22 - 32 mmol/L 23   Calcium 8.9 - 10.3 mg/dL 8.6   Total Protein 6.5 - 8.1 g/dL 7.2   Total Bilirubin 0.3 - 1.2 mg/dL 0.3   Alkaline Phos 38 - 126 U/L 73   AST 15 - 41 U/L 20   ALT 0 - 44 U/L 8       Latest Ref Rng & Units 12/24/2021    1:24 PM  CBC  WBC 4.0 - 10.5 K/uL 6.6   Hemoglobin 13.0 - 17.0 g/dL 9.4   Hematocrit 39.0 - 52.0 % 30.8   Platelets 150 - 400 K/uL 491     No images are attached to the encounter.  CT CHEST ABDOMEN PELVIS W CONTRAST  Result Date: 12/20/2021 CLINICAL DATA:  71 year old male with history of left lower lobe lung cancer diagnosed in July 2021 status post chemotherapy and radiation therapy. Follow-up study. * Tracking Code: BO * EXAM: CT CHEST, ABDOMEN, AND PELVIS WITH CONTRAST TECHNIQUE: Multidetector CT imaging of the chest, abdomen and pelvis was performed following the standard protocol during bolus administration of intravenous contrast. RADIATION DOSE REDUCTION: This exam was performed according to the departmental dose-optimization program which includes  automated exposure control, adjustment of the mA and/or kV according to patient size and/or use of iterative reconstruction technique. CONTRAST:  115mL OMNIPAQUE IOHEXOL 300 MG/ML  SOLN COMPARISON:  CT of the chest, abdomen  and pelvis 07/28/2021. FINDINGS: CT CHEST FINDINGS Cardiovascular: Heart size is normal. Small amount of pericardial fluid and/or thickening, unchanged in unlikely to be of any hemodynamic significance at this time. No associated pericardial calcification. Aortic atherosclerosis. No definite coronary artery calcifications. Mediastinum/Nodes: No pathologically enlarged mediastinal or hilar lymph nodes. Esophagus is unremarkable in appearance. No axillary lymphadenopathy. Lungs/Pleura: Again noted is a area of chronic mass-like architectural distortion in the perihilar aspect of the left lung, grossly similar to the recent prior examination, most compatible with an area of postradiation mass-like fibrosis. Surrounding lung parenchyma is grossly abnormal with widespread areas of ground-glass attenuation, irregular septal thickening and extensive architectural distortion. This is relatively similar to the prior examination. Moderate left pleural effusion, stable compared to the prior examination. No right pleural effusion. No new suspicious appearing pulmonary nodules or masses are noted. Musculoskeletal: There are no aggressive appearing lytic or blastic lesions noted in the visualized portions of the skeleton. CT ABDOMEN PELVIS FINDINGS Hepatobiliary: No suspicious appearing cystic or solid hepatic lesions. No intra or extrahepatic biliary ductal dilatation. Gallbladder is normal in appearance. Pancreas: No solid enhancing pancreatic mass. Several low-attenuation lesions are noted in the pancreatic head and body, similar to the prior examination, with the largest of these lesions in the distal body measuring 1.3 cm in diameter, unchanged. No pancreatic ductal dilatation. No peripancreatic fluid  collections or inflammatory changes. Spleen: Unremarkable. Adrenals/Urinary Tract: Subcentimeter low-attenuation lesions in the kidneys bilaterally, too small to definitively characterize, but statistically likely to represent tiny cysts. Larger low-attenuation lesions in the right kidney are compatible with simple (Bosniak class 1) cysts, largest of which measures 3.1 cm in diameter in the lower pole. No imaging follow-up is recommended for any of these lesions. No suspicious renal lesions. No hydroureteronephrosis. Urinary bladder is nearly completely decompressed, but otherwise unremarkable in appearance. Bilateral adrenal glands are normal in appearance. Stomach/Bowel: The appearance of the stomach is normal. There is no pathologic dilatation of small bowel or colon. The appendix is not confidently identified and may be surgically absent. Regardless, there are no inflammatory changes noted adjacent to the cecum to suggest the presence of an acute appendicitis at this time. Vascular/Lymphatic: Aortic atherosclerosis, without evidence of aneurysm or dissection in the abdominal or pelvic vasculature. No lymphadenopathy noted in the abdomen or pelvis. Reproductive: Prostate gland and seminal vesicles are unremarkable in appearance. Other: Small volume of ascites.  No pneumoperitoneum. Musculoskeletal: There are no aggressive appearing lytic or blastic lesions noted in the visualized portions of the skeleton. IMPRESSION: 1. Stable postradiation changes in the left hemithorax with chronic postradiation mass-like fibrosis and chronic left pleural effusion, which appear grossly unchanged compared to the prior examination. No definitive findings to suggest locally recurrent disease or definite metastatic disease in the chest, abdomen or pelvis. 2. Small amount of pericardial fluid and/or thickening, unchanged and unlikely to be of hemodynamic significance. 3. 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 Continued attention on routine follow-up studies is recommended to ensure continued stability. 4. Aortic atherosclerosis. 5. Additional incidental findings, as above. Electronically Signed   By: Vinnie Langton M.D.   On: 12/20/2021 09:36     Assessment and plan- Patient is a 71 y.o. male with history of recurrent [name] lung cancer currently on maintenance Alimta here for on treatment assessment prior to cycle 18  Counts okay to proceed with cycle 18 of maintenance Alimta today.  I will see him back in 3 weeks forCycle 19.  I have reviewed CT chest abdomen and pelvis images independently and discussed findings with the patient which does not show any evidence of recurrent or progressive disease.  Stable postradiation changes in the left hemithorax.  Plan is to continue Alimta until progression or toxicity.  Overall hemoglobin has remained stable around 9.5 over the last 1 year and kidney numbers are stable.  If anemia starts to worsen or if he develops AKI I will hold his Alimta.   Visit Diagnosis 1. Malignant neoplasm of lower lobe of left lung ([place])   2. Encounter for antineoplastic chemotherapy      Dr. Randa Evens, MD, MPH [facility] [phone] 12/24/2021 4:12 PM

## 2021-12-24 NOTE — Patient Instructions (Signed)
Pemetrexed Injection What is this medication? PEMETREXED (PEM e TREX ed) treats some types of cancer. It works by slowing down the growth of cancer cells. This medicine may be used for other purposes; ask your health care provider or pharmacist if you have questions. COMMON BRAND NAME(S): Alimta, PEMFEXY What should I tell my care team before I take this medication? They need to know if you have any of these conditions: Infection, such as chickenpox, cold sores, or herpes Kidney disease Low blood cell levels (white cells, red cells, and platelets) Lung or breathing disease, such as asthma Radiation therapy An unusual or allergic reaction to pemetrexed, other medications, foods, dyes, or preservatives If you or your partner are pregnant or trying to get pregnant Breast-feeding How should I use this medication? This medication is injected into a vein. It is given by your care team in a hospital or clinic setting. Talk to your care team about the use of this medication in children. Special care may be needed. Overdosage: If you think you have taken too much of this medicine contact a poison control center or emergency room at once. NOTE: This medicine is only for you. Do not share this medicine with others. What if I miss a dose? Keep appointments for follow-up doses. It is important not to miss your dose. Call your care team if you are unable to keep an appointment. What may interact with this medication? Do not take this medication with any of the following: Live virus vaccines This medication may also interact with the following: Ibuprofen This list may not describe all possible interactions. Give your health care provider a list of all the medicines, herbs, non-prescription drugs, or dietary supplements you use. Also tell them if you smoke, drink alcohol, or use illegal drugs. Some items may interact with your medicine. What should I watch for while using this medication? Your condition  will be monitored carefully while you are receiving this medication. This medication may make you feel generally unwell. This is not uncommon as chemotherapy can affect healthy cells as well as cancer cells. Report any side effects. Continue your course of treatment even though you feel ill unless your care team tells you to stop. This medication can cause serious side effects. To reduce the risk, your care team may give you other medications to take before receiving this one. Be sure to follow the directions from your care team. This medication can cause a rash or redness in areas of the body that have previously had radiation therapy. If you have had radiation therapy, tell your care team if you notice a rash in this area. This medication may increase your risk of getting an infection. Call your care team for advice if you get a fever, chills, sore throat, or other symptoms of a cold or flu. Do not treat yourself. Try to avoid being around people who are sick. Be careful brushing or flossing your teeth or using a toothpick because you may get an infection or bleed more easily. If you have any dental work done, tell your dentist you are receiving this medication. Avoid taking medications that contain aspirin, acetaminophen, ibuprofen, naproxen, or ketoprofen unless instructed by your care team. These medications may hide a fever. Check with your care team if you have severe diarrhea, nausea, and vomiting, or if you sweat a lot. The loss of too much body fluid may make it dangerous for you to take this medication. Talk to your care team if you or  your partner wish to become pregnant or think either of you might be pregnant. This medication can cause serious birth defects if taken during pregnancy and for 6 months after the last dose. A negative pregnancy test is required before starting this medication. A reliable form of contraception is recommended while taking this medication and for 6 months after the  last dose. Talk to your care team about reliable forms of contraception. Do not father a child while taking this medication and for 3 months after the last dose. Use a condom while having sex during this time period. Do not breastfeed while taking this medication and for 1 week after the last dose. This medication may cause infertility. Talk to your care team if you are concerned about your fertility. What side effects may I notice from receiving this medication? Side effects that you should report to your care team as soon as possible: Allergic reactions--skin rash, itching, hives, swelling of the face, lips, tongue, or throat Dry cough, shortness of breath or trouble breathing Infection--fever, chills, cough, sore throat, wounds that don't heal, pain or trouble when passing urine, general feeling of discomfort or being unwell Kidney injury--decrease in the amount of urine, swelling of the ankles, hands, or feet Low red blood cell level--unusual weakness or fatigue, dizziness, headache, trouble breathing Redness, blistering, peeling, or loosening of the skin, including inside the mouth Unusual bruising or bleeding Side effects that usually do not require medical attention (report to your care team if they continue or are bothersome): Fatigue Loss of appetite Nausea Vomiting This list may not describe all possible side effects. Call your doctor for medical advice about side effects. You may report side effects to FDA at 1-800-FDA-1088. Where should I keep my medication? This medication is given in a hospital or clinic. It will not be stored at home. NOTE: This sheet is a summary. It may not cover all possible information. If you have questions about this medicine, talk to your doctor, pharmacist, or health care provider.  2023 Elsevier/Gold Standard (2021-06-07 00:00:00)

## 2021-12-24 NOTE — Progress Notes (Signed)
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 When he comes in the afternoon it ruins his plans.

## 2022-01-13 ENCOUNTER — Other Ambulatory Visit: Payer: Self-pay | Admitting: *Deleted

## 2022-01-13 DIAGNOSIS — C3432 Malignant neoplasm of lower lobe, left bronchus or lung: Secondary | ICD-10-CM

## 2022-01-14 ENCOUNTER — Ambulatory Visit: Payer: Medicare Other

## 2022-01-14 ENCOUNTER — Inpatient Hospital Stay: Payer: Medicare Other

## 2022-01-14 ENCOUNTER — Encounter: Payer: Self-pay | Admitting: Oncology

## 2022-01-14 ENCOUNTER — Telehealth: Payer: Self-pay | Admitting: *Deleted

## 2022-01-14 ENCOUNTER — Inpatient Hospital Stay: Payer: Medicare Other | Attending: Radiation Oncology | Admitting: Oncology

## 2022-01-14 VITALS — BP 122/87 | HR 93 | Temp 96.7°F | Wt 172.0 lb

## 2022-01-14 DIAGNOSIS — C3432 Malignant neoplasm of lower lobe, left bronchus or lung: Secondary | ICD-10-CM

## 2022-01-14 DIAGNOSIS — Z5112 Encounter for antineoplastic immunotherapy: Secondary | ICD-10-CM | POA: Diagnosis present

## 2022-01-14 DIAGNOSIS — D6481 Anemia due to antineoplastic chemotherapy: Secondary | ICD-10-CM | POA: Insufficient documentation

## 2022-01-14 DIAGNOSIS — E876 Hypokalemia: Secondary | ICD-10-CM

## 2022-01-14 DIAGNOSIS — D75838 Other thrombocytosis: Secondary | ICD-10-CM | POA: Diagnosis not present

## 2022-01-14 DIAGNOSIS — Z5111 Encounter for antineoplastic chemotherapy: Secondary | ICD-10-CM

## 2022-01-14 DIAGNOSIS — I10 Essential (primary) hypertension: Secondary | ICD-10-CM | POA: Insufficient documentation

## 2022-01-14 DIAGNOSIS — Z87891 Personal history of nicotine dependence: Secondary | ICD-10-CM | POA: Insufficient documentation

## 2022-01-14 DIAGNOSIS — T451X5A Adverse effect of antineoplastic and immunosuppressive drugs, initial encounter: Secondary | ICD-10-CM | POA: Diagnosis not present

## 2022-01-14 DIAGNOSIS — Z7951 Long term (current) use of inhaled steroids: Secondary | ICD-10-CM | POA: Diagnosis not present

## 2022-01-14 DIAGNOSIS — Z79899 Other long term (current) drug therapy: Secondary | ICD-10-CM | POA: Diagnosis not present

## 2022-01-14 DIAGNOSIS — E785 Hyperlipidemia, unspecified: Secondary | ICD-10-CM | POA: Insufficient documentation

## 2022-01-14 LAB — CBC WITH DIFFERENTIAL/PLATELET
Abs Immature Granulocytes: 0.03 10*3/uL (ref 0.00–0.07)
Basophils Absolute: 0 10*3/uL (ref 0.0–0.1)
Basophils Relative: 0 %
Eosinophils Absolute: 0 10*3/uL (ref 0.0–0.5)
Eosinophils Relative: 1 %
HCT: 29.3 % — ABNORMAL LOW (ref 39.0–52.0)
Hemoglobin: 9 g/dL — ABNORMAL LOW (ref 13.0–17.0)
Immature Granulocytes: 1 %
Lymphocytes Relative: 23 %
Lymphs Abs: 1.2 10*3/uL (ref 0.7–4.0)
MCH: 27.6 pg (ref 26.0–34.0)
MCHC: 30.7 g/dL (ref 30.0–36.0)
MCV: 89.9 fL (ref 80.0–100.0)
Monocytes Absolute: 0.7 10*3/uL (ref 0.1–1.0)
Monocytes Relative: 13 %
Neutro Abs: 3.3 10*3/uL (ref 1.7–7.7)
Neutrophils Relative %: 62 %
Platelets: 637 10*3/uL — ABNORMAL HIGH (ref 150–400)
RBC: 3.26 MIL/uL — ABNORMAL LOW (ref 4.22–5.81)
RDW: 17.6 % — ABNORMAL HIGH (ref 11.5–15.5)
WBC: 5.3 10*3/uL (ref 4.0–10.5)
nRBC: 0 % (ref 0.0–0.2)

## 2022-01-14 LAB — COMPREHENSIVE METABOLIC PANEL
ALT: 7 U/L (ref 0–44)
AST: 22 U/L (ref 15–41)
Albumin: 3 g/dL — ABNORMAL LOW (ref 3.5–5.0)
Alkaline Phosphatase: 77 U/L (ref 38–126)
Anion gap: 9 (ref 5–15)
BUN: 12 mg/dL (ref 8–23)
CO2: 23 mmol/L (ref 22–32)
Calcium: 8.9 mg/dL (ref 8.9–10.3)
Chloride: 110 mmol/L (ref 98–111)
Creatinine, Ser: 1.11 mg/dL (ref 0.61–1.24)
GFR, Estimated: >60 mL/min (ref >60)
Glucose, Bld: 140 mg/dL — ABNORMAL HIGH (ref 70–99)
Potassium: 3.2 mmol/L — ABNORMAL LOW (ref 3.5–5.1)
Sodium: 142 mmol/L (ref 135–145)
Total Bilirubin: 0.5 mg/dL (ref 0.3–1.2)
Total Protein: 7.5 g/dL (ref 6.5–8.1)

## 2022-01-14 LAB — TSH: TSH: 1.645 u[IU]/mL (ref 0.350–4.500)

## 2022-01-14 MED ORDER — POTASSIUM CHLORIDE IN NACL 20-0.9 MEQ/L-% IV SOLN
Freq: Once | INTRAVENOUS | Status: AC
  Filled 2022-01-14: qty 1000

## 2022-01-14 MED ORDER — POTASSIUM CHLORIDE CRYS ER 20 MEQ PO TBCR: 20.0000 meq | EXTENDED_RELEASE_TABLET | Freq: Two times a day (BID) | ORAL | 0 refills | Status: DC

## 2022-01-14 MED ORDER — HEPARIN SOD (PORK) LOCK FLUSH 100 UNIT/ML IV SOLN
500.0000 [IU] | Freq: Once | INTRAVENOUS | Status: AC | PRN
  Administered 2022-01-14: 500 [IU]
  Filled 2022-01-14: qty 5

## 2022-01-14 MED ORDER — PROCHLORPERAZINE MALEATE 10 MG PO TABS
10.0000 mg | ORAL_TABLET | Freq: Once | ORAL | Status: AC
  Administered 2022-01-14: 10 mg via ORAL
  Filled 2022-01-14: qty 1

## 2022-01-14 MED ORDER — SODIUM CHLORIDE 0.9 % IV SOLN
500.0000 mg/m2 | Freq: Once | INTRAVENOUS | Status: AC
  Administered 2022-01-14: 1000 mg via INTRAVENOUS
  Filled 2022-01-14: qty 40

## 2022-01-14 MED ORDER — SODIUM CHLORIDE 0.9 % IV SOLN
Freq: Once | INTRAVENOUS | Status: AC
  Filled 2022-01-14: qty 250

## 2022-01-14 NOTE — Telephone Encounter (Signed)
Pt here for chemo and pt good to go for treatment and he needs 1 liter of NS with 20 meq potassium over 1 hour and I will send him in oral potassium also because of his low level 3.2 potassium

## 2022-01-14 NOTE — Progress Notes (Signed)
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  He underwent CT angios chest which did not show any PE.  Soft tissue attenuation measuring 2.9 x 4 cm centered in the left hilum resulting in abrupt angulation and narrowing of the pulmonary arteries and the central left lower lobe airways.  Additional ipsilateral hilar and subcarinal adenopathy.  No contralateral adenopathy.  Consolidative masslike opacity 3.6 x 3.1 cm in size and contiguous with more central perihilar soft tissue attenuation.  Overall findings concerning for primary bronchogenic carcinoma.    Patient lives with his sister who is his main caregiver.  He does not drive but is independent of his ADLs.  Reports ongoing fatigue and occasional retrosternal chest pain.  He has been evaluated by GI in the past for reflux as well.  Appetie is fair and weight is stable.  Denies any significant shortness of breath at this time.  Patient is an ex-smoker and smoked for about 20 years but quit smoking back in 1994   PET CT scan showed hypermetabolic mass in the left lower lobe with mediastinal adenopathy and right paratracheal adenopathy.  No evidence of distant metastatic disease.  Pathology was consistent with non-small cell lung cancer favor adenocarcinoma.  Cells were positive for TTF-1 and negative for p40.    Patient started concurrent carbotaxol radiation and then had allergic reaction to Taxol for which she was switched to Abraxane.  Given the short supply of Abraxane patient received carbo Alimta midway through his concurrent chemoradiation.  Scans post chemoradiation showed partial response and patient will be started on maintenance durvalumab on 01/14/2020.  Patient was on maintenance durvalumab and received 15 cycles up until August 2022.  He was noted to have worsening chest pain and was in the ER multiple times in September 2022.  Repeat CT scan showed concern for hilar recurrence.  Patient received palliative radiation to the growing left upper lobe lung massWas given carbo Alimta for 4 cycles and presently on maintenance Alimta    Interval history-reports doing well presently.  Denies any chest wall pain cough or shortness of breath.  Denies any recent illnesses.  ECOG PS- 1 Pain scale- 0   Review of systems- Review of Systems  Constitutional:  Negative for chills, fever, malaise/fatigue and weight loss.  HENT:  Negative for congestion, ear discharge and nosebleeds.   Eyes:  Negative for blurred vision.  Respiratory:  Negative for cough, hemoptysis, sputum production, shortness of breath and wheezing.   Cardiovascular:  Negative for chest pain, palpitations, orthopnea and claudication.  Gastrointestinal:  Negative for abdominal pain, blood in stool, constipation, diarrhea, heartburn, melena, nausea and vomiting.  Genitourinary:  Negative for dysuria, flank pain, frequency, hematuria and urgency.  Musculoskeletal:  Negative for back pain, joint pain and myalgias.  Skin:  Negative for rash.  Neurological:  Negative for dizziness, tingling, focal weakness, seizures, weakness and headaches.  Endo/Heme/Allergies:  Does not bruise/bleed easily.  Psychiatric/Behavioral:  Negative for depression and suicidal ideas. The patient does not have insomnia.       Allergies  Allergen Reactions    Taxol [Paclitaxel] Other (See Comments)    Chest pain, hip pain, coughing , wheezing     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 ([place])      Past Surgical History:  Procedure Laterality Date   ESOPHAGOGASTRODUODENOSCOPY (EGD) WITH PROPOFOL N/A 12/01/2020   Procedure: ESOPHAGOGASTRODUODENOSCOPY (EGD) WITH PROPOFOL;  Surgeon: Lin Landsman, MD;  Location: [facility];  Service: Gastroenterology;  Laterality: N/A;   PORTA CATH INSERTION N/A 10/22/2019   Procedure: PORTA CATH INSERTION;  Surgeon: Katha Cabal, MD;  Location: [facility];  Service: Cardiovascular;  Laterality: N/A;   VIDEO BRONCHOSCOPY WITH ENDOBRONCHIAL ULTRASOUND N/A 09/25/2019   Procedure: VIDEO BRONCHOSCOPY WITH ENDOBRONCHIAL ULTRASOUND;  Surgeon: Tyler Pita, MD;  Location: [facility];  Service: Pulmonary;  Laterality: N/A;    Social History   Socioeconomic History   Marital status: Single    Spouse name: Not on file   Number of children: Not on file   Years of education: Not on file   Highest education level: Not on file  Occupational History   Not on file  Tobacco Use   Smoking status: Former    Packs/day: 1.00    Years: 20.00    Total pack years: 20.00    Types: Cigarettes    Quit date: 77    Years since quitting: 29.9   Smokeless tobacco: Never  Vaping Use   Vaping Use: Never used  Substance and Sexual Activity   Alcohol use: Not Currently    Comment: not drank any beer in 2 onths   Drug use: Never   Sexual activity: Not Currently  Other Topics Concern   Not on file  Social History Narrative   Not on file   Social Determinants of Health   Financial Resource Strain: Not on file  Food Insecurity: Not on file  Transportation Needs: Unmet Transportation Needs (12/24/2021)   PRAPARE - Hydrologist (Medical): Yes    Lack of Transportation  (Non-Medical): Not on file  Physical Activity: Not on file  Stress: Not on file  Social Connections: Not on file  Intimate Partner Violence: Not on file    Family History  Problem Relation Age of Onset   Cancer Brother      Current Outpatient Medications:    acetaminophen (TYLENOL) 500 MG tablet, Take 1,000 mg by mouth every 6 (six) hours as needed for mild pain, moderate pain or headache., Disp: , Rfl:    albuterol (VENTOLIN HFA) 108 (90 Base) MCG/ACT inhaler, Inhale 2 puffs into the lungs every 4 (four) hours as needed for wheezing or shortness of breath., Disp: 8 g, Rfl: 1   ANORO ELLIPTA 62.5-25 MCG/ACT AEPB, INHALE 1 PUFF INTO THE LUNGS DAILY, Disp: 60 each, Rfl: 2   Budeson-Glycopyrrol-Formoterol (BREZTRI AEROSPHERE) 160-9-4.8 MCG/ACT AERO, Inhale 2 puffs into the lungs in the morning and at bedtime., Disp: 10.7 g, Rfl: 5   FLOVENT HFA 220 MCG/ACT inhaler, Inhale 2 puffs into the lungs 2 (two) times daily., Disp: , Rfl:    lidocaine-prilocaine (EMLA) cream, Apply 1 application topically as needed., Disp: 30 g, Rfl: 3   potassium chloride SA (KLOR-CON M) 20  MEQ tablet, Take 1 tablet (20 mEq total) by mouth 2 (two) times daily., Disp: 10 tablet, Rfl: 0   folic acid (FOLVITE) 1 MG tablet, Take 1 tablet (1 mg total) by mouth daily. (Patient not taking: Reported on 01/14/2022), Disp: 30 tablet, Rfl: 3   lidocaine (LIDODERM) 5 %, Place 1 patch onto the skin daily. Remove & Discard patch within 12 hours or as directed by MD (Patient not taking: Reported on 05/07/2021), Disp: 30 patch, Rfl: 0   naloxegol oxalate (MOVANTIK) 25 MG TABS tablet, Take 1 tablet (25 mg total) by mouth daily. (Patient not taking: Reported on 05/07/2021), Disp: 30 tablet, Rfl: 2 No current facility-administered medications for this visit.  Facility-Administered Medications Ordered in Other Visits:    PEMEtrexed (ALIMTA) 1,000 mg in sodium chloride 0.9 % 100 mL chemo infusion, 500 mg/m2 (Treatment Plan Recorded),  Intravenous, Once, Sindy Guadeloupe, MD  Physical exam:  Vitals:   01/14/22 0834  BP: 122/87  Pulse: 93  Temp: (!) 96.7 F (35.9 C)  TempSrc: Tympanic  Weight: 172 lb (78 kg)   Physical Exam Cardiovascular:     Rate and Rhythm: Normal rate and regular rhythm.     Heart sounds: Normal heart sounds.  Pulmonary:     Effort: Pulmonary effort is normal.     Breath sounds: Normal breath sounds.  Abdominal:     General: Bowel sounds are normal.     Palpations: Abdomen is soft.  Skin:    General: Skin is warm and dry.  Neurological:     Mental Status: He is alert and oriented to person, place, and time.         Latest Ref Rng & Units 01/14/2022    8:26 AM  CMP  Glucose 70 - 99 mg/dL 140   BUN 8 - 23 mg/dL 12   Creatinine 0.61 - 1.24 mg/dL 1.11   Sodium 135 - 145 mmol/L 142   Potassium 3.5 - 5.1 mmol/L 3.2   Chloride 98 - 111 mmol/L 110   CO2 22 - 32 mmol/L 23   Calcium 8.9 - 10.3 mg/dL 8.9   Total Protein 6.5 - 8.1 g/dL 7.5   Total Bilirubin 0.3 - 1.2 mg/dL 0.5   Alkaline Phos 38 - 126 U/L 77   AST 15 - 41 U/L 22   ALT 0 - 44 U/L 7       Latest Ref Rng & Units 01/14/2022    8:26 AM  CBC  WBC 4.0 - 10.5 K/uL 5.3   Hemoglobin 13.0 - 17.0 g/dL 9.0   Hematocrit 39.0 - 52.0 % 29.3   Platelets 150 - 400 K/uL 637     No images are attached to the encounter.  CT CHEST ABDOMEN PELVIS W CONTRAST  Result Date: 12/20/2021 CLINICAL DATA:  71 year old male with history of left lower lobe lung cancer diagnosed in July 2021 status post chemotherapy and radiation therapy. Follow-up study. * Tracking Code: BO * EXAM: CT CHEST, ABDOMEN, AND PELVIS WITH CONTRAST TECHNIQUE: Multidetector CT imaging of the chest, abdomen and pelvis was performed following the standard protocol during bolus administration of intravenous contrast. RADIATION DOSE REDUCTION: This exam was performed according to the departmental dose-optimization program which includes automated exposure control, adjustment of  the mA and/or kV according to patient size and/or use of iterative reconstruction technique. CONTRAST:  171mL OMNIPAQUE IOHEXOL 300 MG/ML  SOLN COMPARISON:  CT of the chest, abdomen and pelvis 07/28/2021. FINDINGS: CT CHEST FINDINGS Cardiovascular: Heart size is  normal. Small amount of pericardial fluid and/or thickening, unchanged in unlikely to be of any hemodynamic significance at this time. No associated pericardial calcification. Aortic atherosclerosis. No definite coronary artery calcifications. Mediastinum/Nodes: No pathologically enlarged mediastinal or hilar lymph nodes. Esophagus is unremarkable in appearance. No axillary lymphadenopathy. Lungs/Pleura: Again noted is a area of chronic mass-like architectural distortion in the perihilar aspect of the left lung, grossly similar to the recent prior examination, most compatible with an area of postradiation mass-like fibrosis. Surrounding lung parenchyma is grossly abnormal with widespread areas of ground-glass attenuation, irregular septal thickening and extensive architectural distortion. This is relatively similar to the prior examination. Moderate left pleural effusion, stable compared to the prior examination. No right pleural effusion. No new suspicious appearing pulmonary nodules or masses are noted. Musculoskeletal: There are no aggressive appearing lytic or blastic lesions noted in the visualized portions of the skeleton. CT ABDOMEN PELVIS FINDINGS Hepatobiliary: No suspicious appearing cystic or solid hepatic lesions. No intra or extrahepatic biliary ductal dilatation. Gallbladder is normal in appearance. Pancreas: No solid enhancing pancreatic mass. Several low-attenuation lesions are noted in the pancreatic head and body, similar to the prior examination, with the largest of these lesions in the distal body measuring 1.3 cm in diameter, unchanged. No pancreatic ductal dilatation. No peripancreatic fluid collections or inflammatory changes. Spleen:  Unremarkable. Adrenals/Urinary Tract: Subcentimeter low-attenuation lesions in the kidneys bilaterally, too small to definitively characterize, but statistically likely to represent tiny cysts. Larger low-attenuation lesions in the right kidney are compatible with simple (Bosniak class 1) cysts, largest of which measures 3.1 cm in diameter in the lower pole. No imaging follow-up is recommended for any of these lesions. No suspicious renal lesions. No hydroureteronephrosis. Urinary bladder is nearly completely decompressed, but otherwise unremarkable in appearance. Bilateral adrenal glands are normal in appearance. Stomach/Bowel: The appearance of the stomach is normal. There is no pathologic dilatation of small bowel or colon. The appendix is not confidently identified and may be surgically absent. Regardless, there are no inflammatory changes noted adjacent to the cecum to suggest the presence of an acute appendicitis at this time. Vascular/Lymphatic: Aortic atherosclerosis, without evidence of aneurysm or dissection in the abdominal or pelvic vasculature. No lymphadenopathy noted in the abdomen or pelvis. Reproductive: Prostate gland and seminal vesicles are unremarkable in appearance. Other: Small volume of ascites.  No pneumoperitoneum. Musculoskeletal: There are no aggressive appearing lytic or blastic lesions noted in the visualized portions of the skeleton. IMPRESSION: 1. Stable postradiation changes in the left hemithorax with chronic postradiation mass-like fibrosis and chronic left pleural effusion, which appear grossly unchanged compared to the prior examination. No definitive findings to suggest locally recurrent disease or definite metastatic disease in the chest, abdomen or pelvis. 2. Small amount of pericardial fluid and/or thickening, unchanged and unlikely to be of hemodynamic significance. 3. 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 Continued attention on routine follow-up studies is recommended to ensure continued stability. 4. Aortic atherosclerosis. 5. Additional incidental findings, as above. Electronically Signed   By: Vinnie Langton M.D.   On: 12/20/2021 09:36     Assessment and plan- Patient is a 71 y.o. male with history of recurrent [name] lung cancer currently on maintenance Alimta here for on treatment assessment prior to next cycle  Counts okay to proceed with cycle 19 of maintenance Alimta today.I will see him back in 3 weeks for cycle 20.  Baseline  hemoglobin runs around 10 and is presently down to 9.  Suspect this could be secondary to Alimta.  He will be receiving B12 injection at my next visit.  Thrombocytosis likely reactive.  Continue to monitor.    Hypokalemia will receive 20 mEq of IV potassium today and we will also send a prescription for oral potassium.  Recent scans in November 2023 showed no evidence of recurrent or progressive disease.     Visit Diagnosis 1. Malignant neoplasm of lower lobe of left lung ([place])   2. Encounter for antineoplastic chemotherapy   3. Antineoplastic chemotherapy induced anemia   4. Reactive thrombocytosis   5. Hypokalemia      Dr. Randa Evens, MD, MPH [facility] at [facility] [phone] 01/14/2022 10:11 AM

## 2022-01-22 ENCOUNTER — Other Ambulatory Visit: Payer: Self-pay | Admitting: Oncology

## 2022-01-24 ENCOUNTER — Other Ambulatory Visit: Payer: Self-pay | Admitting: Oncology

## 2022-01-24 NOTE — Telephone Encounter (Signed)
Component Ref Range & Units 10 d ago (01/14/22) 1 mo ago (12/24/21) 1 mo ago (12/03/21) 2 mo ago (11/12/21) 3 mo ago (10/22/21) 3 mo ago (10/01/21) 4 mo ago (09/10/21)  Potassium 3.5 - 5.1 mmol/L 3.2 Low  3.6 4.1 3.8 4.0 3.6 3.7

## 2022-02-02 ENCOUNTER — Other Ambulatory Visit: Payer: Self-pay | Admitting: Oncology

## 2022-02-02 NOTE — Telephone Encounter (Signed)
  Component Ref Range & Units 2 wk ago (01/14/22) 1 mo ago (12/24/21) 2 mo ago (12/03/21) 2 mo ago (11/12/21) 3 mo ago (10/22/21) 4 mo ago (10/01/21) 4 mo ago (09/10/21)  Potassium 3.5 - 5.1 mmol/L 3.2 Low  3.6 4.1 3.8 4.0 3.6 3.7

## 2022-02-04 ENCOUNTER — Inpatient Hospital Stay (HOSPITAL_BASED_OUTPATIENT_CLINIC_OR_DEPARTMENT_OTHER): Payer: Medicare Other | Admitting: Oncology

## 2022-02-04 ENCOUNTER — Encounter: Payer: Self-pay | Admitting: Oncology

## 2022-02-04 ENCOUNTER — Inpatient Hospital Stay: Payer: Medicare Other

## 2022-02-04 ENCOUNTER — Ambulatory Visit: Payer: Medicare Other

## 2022-02-04 VITALS — BP 125/82 | HR 89 | Temp 97.2°F | Wt 173.9 lb

## 2022-02-04 DIAGNOSIS — C3432 Malignant neoplasm of lower lobe, left bronchus or lung: Secondary | ICD-10-CM

## 2022-02-04 DIAGNOSIS — T451X5A Adverse effect of antineoplastic and immunosuppressive drugs, initial encounter: Secondary | ICD-10-CM

## 2022-02-04 DIAGNOSIS — Z5112 Encounter for antineoplastic immunotherapy: Secondary | ICD-10-CM | POA: Diagnosis not present

## 2022-02-04 DIAGNOSIS — D6481 Anemia due to antineoplastic chemotherapy: Secondary | ICD-10-CM | POA: Diagnosis not present

## 2022-02-04 DIAGNOSIS — E876 Hypokalemia: Secondary | ICD-10-CM

## 2022-02-04 DIAGNOSIS — N179 Acute kidney failure, unspecified: Secondary | ICD-10-CM | POA: Diagnosis not present

## 2022-02-04 LAB — CBC WITH DIFFERENTIAL/PLATELET
Abs Immature Granulocytes: 0.02 10*3/uL (ref 0.00–0.07)
Basophils Absolute: 0 10*3/uL (ref 0.0–0.1)
Basophils Relative: 0 %
Eosinophils Absolute: 0 10*3/uL (ref 0.0–0.5)
Eosinophils Relative: 0 %
HCT: 27.8 % — ABNORMAL LOW (ref 39.0–52.0)
Hemoglobin: 8.8 g/dL — ABNORMAL LOW (ref 13.0–17.0)
Immature Granulocytes: 0 %
Lymphocytes Relative: 26 %
Lymphs Abs: 1.3 10*3/uL (ref 0.7–4.0)
MCH: 28.5 pg (ref 26.0–34.0)
MCHC: 31.7 g/dL (ref 30.0–36.0)
MCV: 90 fL (ref 80.0–100.0)
Monocytes Absolute: 0.7 10*3/uL (ref 0.1–1.0)
Monocytes Relative: 14 %
Neutro Abs: 3.1 10*3/uL (ref 1.7–7.7)
Neutrophils Relative %: 60 %
Platelets: 667 10*3/uL — ABNORMAL HIGH (ref 150–400)
RBC: 3.09 MIL/uL — ABNORMAL LOW (ref 4.22–5.81)
RDW: 18.6 % — ABNORMAL HIGH (ref 11.5–15.5)
WBC: 5.1 10*3/uL (ref 4.0–10.5)
nRBC: 0 % (ref 0.0–0.2)

## 2022-02-04 LAB — COMPREHENSIVE METABOLIC PANEL
ALT: 7 U/L (ref 0–44)
AST: 23 U/L (ref 15–41)
Albumin: 3 g/dL — ABNORMAL LOW (ref 3.5–5.0)
Alkaline Phosphatase: 67 U/L (ref 38–126)
Anion gap: 9 (ref 5–15)
BUN: 9 mg/dL (ref 8–23)
CO2: 21 mmol/L — ABNORMAL LOW (ref 22–32)
Calcium: 8.5 mg/dL — ABNORMAL LOW (ref 8.9–10.3)
Chloride: 111 mmol/L (ref 98–111)
Creatinine, Ser: 1.32 mg/dL — ABNORMAL HIGH (ref 0.61–1.24)
GFR, Estimated: 57 mL/min — ABNORMAL LOW (ref >60)
Glucose, Bld: 164 mg/dL — ABNORMAL HIGH (ref 70–99)
Potassium: 2.9 mmol/L — ABNORMAL LOW (ref 3.5–5.1)
Sodium: 141 mmol/L (ref 135–145)
Total Bilirubin: 0.5 mg/dL (ref 0.3–1.2)
Total Protein: 7.5 g/dL (ref 6.5–8.1)

## 2022-02-04 MED ORDER — POTASSIUM CHLORIDE CRYS ER 20 MEQ PO TBCR: 20.0000 meq | EXTENDED_RELEASE_TABLET | Freq: Two times a day (BID) | ORAL | 0 refills | Status: DC

## 2022-02-04 NOTE — Progress Notes (Signed)
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  He underwent CT angios chest which did not show any PE.  Soft tissue attenuation measuring 2.9 x 4 cm centered in the left hilum resulting in abrupt angulation and narrowing of the pulmonary arteries and the central left lower lobe airways.  Additional ipsilateral hilar and subcarinal adenopathy.  No contralateral adenopathy.  Consolidative masslike opacity 3.6 x 3.1 cm in size and contiguous with more central perihilar soft tissue attenuation.  Overall findings concerning for primary bronchogenic carcinoma.    Patient lives with his sister who is his main caregiver.  He does not drive but is independent of his ADLs.  Reports ongoing fatigue and occasional retrosternal chest pain.  He has been evaluated by GI in the past for reflux as well.  Appetie is fair and weight is stable.  Denies any significant shortness of breath at this time.  Patient is an ex-smoker and smoked for about 20 years but quit smoking back in 1994   PET CT scan showed hypermetabolic mass in the left lower lobe with mediastinal adenopathy and right paratracheal adenopathy.  No evidence of distant metastatic disease.  Pathology was consistent with non-small cell lung cancer favor adenocarcinoma.  Cells were positive for TTF-1 and negative for p40.    Patient started concurrent carbotaxol radiation and then had allergic reaction to Taxol for which she was switched to Abraxane.  Given the short supply of Abraxane patient received carbo Alimta midway through his concurrent chemoradiation.  Scans post chemoradiation showed partial response and patient will be started on maintenance durvalumab on 01/14/2020.  Patient was on maintenance durvalumab and received 15 cycles up until August 2022.  He was noted to have worsening chest pain and was in the ER multiple times in September 2022.  Repeat CT scan showed concern for hilar recurrence.  Patient received palliative radiation to the growing left upper lobe lung massWas given carbo Alimta for 4 cycles and presently on maintenance Alimta  Interval history-patient reports doing well.  Denies any exertional shortness of breath or chest pain.  Appetite and weight have remainedStable.  ECOG PS- 1 Pain scale- 0 Opioid associated constipation- no  Review of systems- Review of Systems  Constitutional:  Negative for chills, fever, malaise/fatigue and weight loss.  HENT:  Negative for congestion, ear discharge and nosebleeds.   Eyes:  Negative for blurred vision.  Respiratory:  Negative for cough, hemoptysis, sputum production, shortness of breath and wheezing.   Cardiovascular:  Negative for chest pain, palpitations, orthopnea and claudication.  Gastrointestinal:  Negative for abdominal pain, blood in stool, constipation, diarrhea, heartburn, melena, nausea and vomiting.  Genitourinary:  Negative for dysuria, flank pain, frequency, hematuria and urgency.  Musculoskeletal:  Negative for back pain, joint pain and myalgias.  Skin:  Negative for rash.  Neurological:  Negative for dizziness, tingling, focal weakness, seizures, weakness and headaches.  Endo/Heme/Allergies:  Does not bruise/bleed easily.  Psychiatric/Behavioral:  Negative for depression and suicidal ideas. The patient does not have insomnia.        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 ([place])      Past Surgical History:  Procedure Laterality Date   ESOPHAGOGASTRODUODENOSCOPY (EGD) WITH PROPOFOL N/A 12/01/2020   Procedure: ESOPHAGOGASTRODUODENOSCOPY (EGD) WITH PROPOFOL;  Surgeon: Lin Landsman, MD;  Location: [facility] ENDOSCOPY;  Service: Gastroenterology;  Laterality: N/A;   PORTA CATH INSERTION N/A 10/22/2019   Procedure: PORTA CATH INSERTION;  Surgeon: Katha Cabal, MD;  Location: [place] [facility];  Service: Cardiovascular;  Laterality: N/A;   VIDEO BRONCHOSCOPY WITH ENDOBRONCHIAL ULTRASOUND N/A 09/25/2019   Procedure: VIDEO BRONCHOSCOPY WITH ENDOBRONCHIAL ULTRASOUND;  Surgeon: Tyler Pita, MD;  Location: [facility] ORS;  Service: Pulmonary;  Laterality: N/A;    Social History   Socioeconomic History   Marital status: Single    Spouse name: Not on file   Number of children: Not on file   Years of education: Not on file   Highest education level: Not on file  Occupational History   Not on file  Tobacco Use   Smoking status: Former    Packs/day: 1.00    Years: 20.00    Total pack years: 20.00    Types: Cigarettes    Quit date: 58    Years since quitting: 29.9   Smokeless tobacco: Never  Vaping Use   Vaping Use: Never used  Substance and Sexual Activity   Alcohol use: Not Currently    Comment: not drank any beer in 2 onths   Drug use: Never   Sexual activity: Not Currently  Other Topics Concern   Not on file  Social History Narrative   Not on file   Social Determinants of Health   Financial Resource Strain: Not on file  Food Insecurity: Not on file  Transportation Needs: Unmet Transportation Needs (02/04/2022)   PRAPARE - Hydrologist  (Medical): Yes    Lack of Transportation (Non-Medical): Yes  Physical Activity: Not on file  Stress: Not on file  Social Connections: Not on file  Intimate Partner Violence: Not on file    Family History  Problem Relation Age of Onset   Cancer Brother      Current Outpatient Medications:    acetaminophen (TYLENOL) 500 MG tablet, Take 1,000 mg by mouth every 6 (six) hours as needed for mild pain, moderate pain or headache., Disp: , Rfl:    albuterol (VENTOLIN HFA) 108 (90 Base) MCG/ACT inhaler, Inhale 2 puffs into the lungs every 4 (four) hours as needed for wheezing or shortness of breath., Disp: 8 g, Rfl: 1   ANORO ELLIPTA 62.5-25 MCG/ACT AEPB, INHALE 1 PUFF INTO THE LUNGS DAILY, Disp: 60 each, Rfl: 2   Budeson-Glycopyrrol-Formoterol (BREZTRI AEROSPHERE) 160-9-4.8 MCG/ACT AERO, Inhale 2 puffs into the lungs in the morning and at bedtime., Disp: 10.7 g, Rfl: 5   FLOVENT HFA 220 MCG/ACT inhaler, Inhale 2 puffs into the lungs 2 (two) times daily., Disp: , Rfl:    lidocaine-prilocaine (EMLA) cream, Apply 1 application topically as needed., Disp: 30 g, Rfl: 3   potassium chloride SA (KLOR-CON M) 20 MEQ tablet,  TAKE 1 TABLET(20 MEQ) BY MOUTH TWICE DAILY, Disp: 10 tablet, Rfl: 0   folic acid (FOLVITE) 1 MG tablet, Take 1 tablet (1 mg total) by mouth daily. (Patient not taking: Reported on 01/14/2022), Disp: 30 tablet, Rfl: 3   lidocaine (LIDODERM) 5 %, Place 1 patch onto the skin daily. Remove & Discard patch within 12 hours or as directed by MD (Patient not taking: Reported on 05/07/2021), Disp: 30 patch, Rfl: 0   naloxegol oxalate (MOVANTIK) 25 MG TABS tablet, Take 1 tablet (25 mg total) by mouth daily. (Patient not taking: Reported on 05/07/2021), Disp: 30 tablet, Rfl: 2  Physical exam:  Vitals:   02/04/22 0850  BP: 125/82  Pulse: 89  Temp: (!) 97.2 F (36.2 C)  Weight: 173 lb 14.4 oz (78.9 kg)   Physical Exam Cardiovascular:     Rate and Rhythm: Regular rhythm. Tachycardia present.      Heart sounds: Normal heart sounds.  Pulmonary:     Effort: Pulmonary effort is normal.     Breath sounds: Normal breath sounds.  Abdominal:     General: Bowel sounds are normal.     Palpations: Abdomen is soft.  Skin:    General: Skin is warm and dry.  Neurological:     Mental Status: He is alert and oriented to person, place, and time.         Latest Ref Rng & Units 01/14/2022    8:26 AM  CMP  Glucose 70 - 99 mg/dL 140   BUN 8 - 23 mg/dL 12   Creatinine 0.61 - 1.24 mg/dL 1.11   Sodium 135 - 145 mmol/L 142   Potassium 3.5 - 5.1 mmol/L 3.2   Chloride 98 - 111 mmol/L 110   CO2 22 - 32 mmol/L 23   Calcium 8.9 - 10.3 mg/dL 8.9   Total Protein 6.5 - 8.1 g/dL 7.5   Total Bilirubin 0.3 - 1.2 mg/dL 0.5   Alkaline Phos 38 - 126 U/L 77   AST 15 - 41 U/L 22   ALT 0 - 44 U/L 7       Latest Ref Rng & Units 02/04/2022    8:21 AM  CBC  WBC 4.0 - 10.5 K/uL 5.1   Hemoglobin 13.0 - 17.0 g/dL 8.8   Hematocrit 39.0 - 52.0 % 27.8   Platelets 150 - 400 K/uL 667      Assessment and plan- Patient is a 71 y.o. male with history of recurrent [name] lung cancer he is here for on treatment assessment prior to cycle 20 of maintenance Alimta  Patient is getting progressively anemic at this point.  His hemoglobin was rose to 10.5 prior to starting Alimta and is down to 8.8.  I suspect this is secondary to chemotherapy.  I will therefore keep his chemotherapy on hold at this time.  His last scan in November 2023 did not show any evidence of progression.  I will plan to get scans in early February and see him thereafter.  I would likely hold off on giving him chemotherapy as long as there is no overt progression.  Suspect thrombocytosis is reactive.  No signs and symptoms of infection at this time.  Continue to monitor  AKI: Encourage plenty of oral fluids.  Repeat BMP next week for possible IV fluids.  AKI possibly secondary to Alimta  Hypokalemia: BMP resulted after the patient had left.  I  am asking him to take potassium 20 mEq twice daily instead of daily  for the next 10 days.  Possible IV potassium next week   Visit Diagnosis 1. Antineoplastic chemotherapy induced anemia   2. Malignant neoplasm of lower lobe of left lung ([place])      Dr. Randa Evens, MD, MPH [facility] at [facility] [phone] 02/04/2022 9:12 AM

## 2022-02-04 NOTE — Addendum Note (Signed)
Addended by: Philipp Deputy on: 02/04/2022 10:03 AM   Modules accepted: Orders

## 2022-02-11 ENCOUNTER — Inpatient Hospital Stay: Payer: Medicare Other

## 2022-02-11 VITALS — BP 132/84 | HR 97 | Temp 97.1°F | Resp 20

## 2022-02-11 DIAGNOSIS — Z5112 Encounter for antineoplastic immunotherapy: Secondary | ICD-10-CM | POA: Diagnosis not present

## 2022-02-11 DIAGNOSIS — N179 Acute kidney failure, unspecified: Secondary | ICD-10-CM

## 2022-02-11 LAB — BASIC METABOLIC PANEL
Anion gap: 7 (ref 5–15)
BUN: 12 mg/dL (ref 8–23)
CO2: 22 mmol/L (ref 22–32)
Calcium: 8.8 mg/dL — ABNORMAL LOW (ref 8.9–10.3)
Chloride: 112 mmol/L — ABNORMAL HIGH (ref 98–111)
Creatinine, Ser: 1.38 mg/dL — ABNORMAL HIGH (ref 0.61–1.24)
GFR, Estimated: 54 mL/min — ABNORMAL LOW (ref >60)
Glucose, Bld: 132 mg/dL — ABNORMAL HIGH (ref 70–99)
Potassium: 3.6 mmol/L (ref 3.5–5.1)
Sodium: 141 mmol/L (ref 135–145)

## 2022-02-11 MED ORDER — SODIUM CHLORIDE 0.9 % IV SOLN
Freq: Once | INTRAVENOUS | Status: AC
  Filled 2022-02-11: qty 250

## 2022-02-11 MED ORDER — SODIUM CHLORIDE 0.9% FLUSH
10.0000 mL | Freq: Once | INTRAVENOUS | Status: AC
  Administered 2022-02-11: 10 mL via INTRAVENOUS
  Filled 2022-02-11: qty 10

## 2022-02-11 MED ORDER — HEPARIN SOD (PORK) LOCK FLUSH 100 UNIT/ML IV SOLN
500.0000 [IU] | Freq: Once | INTRAVENOUS | Status: AC
  Administered 2022-02-11: 500 [IU] via INTRAVENOUS
  Filled 2022-02-11: qty 5

## 2022-02-16 ENCOUNTER — Other Ambulatory Visit: Payer: Self-pay | Admitting: Oncology

## 2022-02-16 NOTE — Telephone Encounter (Signed)
Component Ref Range & Units 12 d ago (02/04/22) 1 mo ago (01/14/22) 1 mo ago (12/24/21) 2 mo ago (12/03/21) 3 mo ago (11/12/21) 3 mo ago (10/22/21) 4 mo ago (10/01/21)  Potassium 3.5 - 5.1 mmol/L 2.9 Low  3.2 Low  3.6 4.1 3.8 4.0 3.6

## 2022-03-11 ENCOUNTER — Encounter: Payer: Self-pay | Admitting: Oncology

## 2022-03-21 ENCOUNTER — Ambulatory Visit
Admission: RE | Admit: 2022-03-21 | Discharge: 2022-03-21 | Disposition: A | Discharge status: Home / Self Care | Payer: 59 | Source: Ambulatory Visit | Attending: Oncology | Admitting: Oncology

## 2022-03-21 ENCOUNTER — Inpatient Hospital Stay: Payer: 59 | Attending: Radiation Oncology

## 2022-03-21 DIAGNOSIS — C3432 Malignant neoplasm of lower lobe, left bronchus or lung: Secondary | ICD-10-CM | POA: Diagnosis not present

## 2022-03-21 DIAGNOSIS — Z87891 Personal history of nicotine dependence: Secondary | ICD-10-CM | POA: Insufficient documentation

## 2022-03-21 DIAGNOSIS — E538 Deficiency of other specified B group vitamins: Secondary | ICD-10-CM | POA: Insufficient documentation

## 2022-03-21 DIAGNOSIS — Z79899 Other long term (current) drug therapy: Secondary | ICD-10-CM | POA: Insufficient documentation

## 2022-03-21 DIAGNOSIS — D649 Anemia, unspecified: Secondary | ICD-10-CM | POA: Insufficient documentation

## 2022-03-21 DIAGNOSIS — I10 Essential (primary) hypertension: Secondary | ICD-10-CM | POA: Insufficient documentation

## 2022-03-21 DIAGNOSIS — Z923 Personal history of irradiation: Secondary | ICD-10-CM | POA: Insufficient documentation

## 2022-03-21 MED ORDER — IOHEXOL 300 MG/ML  SOLN
85.0000 mL | Freq: Once | INTRAMUSCULAR | Status: AC | PRN
  Administered 2022-03-21: 85 mL via INTRAVENOUS

## 2022-03-29 ENCOUNTER — Inpatient Hospital Stay (HOSPITAL_BASED_OUTPATIENT_CLINIC_OR_DEPARTMENT_OTHER): Payer: 59 | Admitting: Oncology

## 2022-03-29 ENCOUNTER — Inpatient Hospital Stay: Payer: 59

## 2022-03-29 ENCOUNTER — Encounter: Payer: Self-pay | Admitting: Oncology

## 2022-03-29 VITALS — BP 155/98 | HR 93 | Temp 96.8°F | Resp 18 | Ht 70.0 in | Wt 171.7 lb

## 2022-03-29 DIAGNOSIS — Z923 Personal history of irradiation: Secondary | ICD-10-CM | POA: Diagnosis not present

## 2022-03-29 DIAGNOSIS — C3432 Malignant neoplasm of lower lobe, left bronchus or lung: Secondary | ICD-10-CM | POA: Diagnosis not present

## 2022-03-29 DIAGNOSIS — I10 Essential (primary) hypertension: Secondary | ICD-10-CM | POA: Diagnosis not present

## 2022-03-29 DIAGNOSIS — Z95828 Presence of other vascular implants and grafts: Secondary | ICD-10-CM

## 2022-03-29 DIAGNOSIS — D649 Anemia, unspecified: Secondary | ICD-10-CM

## 2022-03-29 DIAGNOSIS — D6481 Anemia due to antineoplastic chemotherapy: Secondary | ICD-10-CM

## 2022-03-29 DIAGNOSIS — Z87891 Personal history of nicotine dependence: Secondary | ICD-10-CM | POA: Diagnosis not present

## 2022-03-29 DIAGNOSIS — T451X5A Adverse effect of antineoplastic and immunosuppressive drugs, initial encounter: Secondary | ICD-10-CM

## 2022-03-29 DIAGNOSIS — Z79899 Other long term (current) drug therapy: Secondary | ICD-10-CM | POA: Diagnosis not present

## 2022-03-29 DIAGNOSIS — E538 Deficiency of other specified B group vitamins: Secondary | ICD-10-CM

## 2022-03-29 LAB — CBC WITH DIFFERENTIAL/PLATELET
Abs Immature Granulocytes: 0.01 10*3/uL (ref 0.00–0.07)
Basophils Absolute: 0 10*3/uL (ref 0.0–0.1)
Basophils Relative: 0 %
Eosinophils Absolute: 0.1 10*3/uL (ref 0.0–0.5)
Eosinophils Relative: 1 %
HCT: 35.4 % — ABNORMAL LOW (ref 39.0–52.0)
Hemoglobin: 10.9 g/dL — ABNORMAL LOW (ref 13.0–17.0)
Immature Granulocytes: 0 %
Lymphocytes Relative: 18 %
Lymphs Abs: 1.1 10*3/uL (ref 0.7–4.0)
MCH: 26.7 pg (ref 26.0–34.0)
MCHC: 30.8 g/dL (ref 30.0–36.0)
MCV: 86.8 fL (ref 80.0–100.0)
Monocytes Absolute: 0.5 10*3/uL (ref 0.1–1.0)
Monocytes Relative: 7 %
Neutro Abs: 4.6 10*3/uL (ref 1.7–7.7)
Neutrophils Relative %: 74 %
Platelets: 277 10*3/uL (ref 150–400)
RBC: 4.08 MIL/uL — ABNORMAL LOW (ref 4.22–5.81)
RDW: 15.9 % — ABNORMAL HIGH (ref 11.5–15.5)
WBC: 6.2 10*3/uL (ref 4.0–10.5)
nRBC: 0 % (ref 0.0–0.2)

## 2022-03-29 LAB — IRON AND TIBC
Iron: 46 ug/dL (ref 45–182)
Saturation Ratios: 14 % — ABNORMAL LOW (ref 17.9–39.5)
TIBC: 339 ug/dL (ref 250–450)
UIBC: 293 ug/dL

## 2022-03-29 LAB — COMPREHENSIVE METABOLIC PANEL
ALT: 6 U/L (ref 0–44)
AST: 20 U/L (ref 15–41)
Albumin: 3.4 g/dL — ABNORMAL LOW (ref 3.5–5.0)
Alkaline Phosphatase: 76 U/L (ref 38–126)
Anion gap: 6 (ref 5–15)
BUN: 16 mg/dL (ref 8–23)
CO2: 23 mmol/L (ref 22–32)
Calcium: 8.9 mg/dL (ref 8.9–10.3)
Chloride: 110 mmol/L (ref 98–111)
Creatinine, Ser: 1.07 mg/dL (ref 0.61–1.24)
GFR, Estimated: >60 mL/min (ref >60)
Glucose, Bld: 120 mg/dL — ABNORMAL HIGH (ref 70–99)
Potassium: 3.5 mmol/L (ref 3.5–5.1)
Sodium: 139 mmol/L (ref 135–145)
Total Bilirubin: 0.5 mg/dL (ref 0.3–1.2)
Total Protein: 7.6 g/dL (ref 6.5–8.1)

## 2022-03-29 LAB — FERRITIN: Ferritin: 30 ng/mL (ref 24–336)

## 2022-03-29 MED ORDER — SODIUM CHLORIDE 0.9% FLUSH
10.0000 mL | INTRAVENOUS | Status: DC | PRN
  Administered 2022-03-29: 10 mL via INTRAVENOUS
  Filled 2022-03-29: qty 10

## 2022-03-29 MED ORDER — HEPARIN SOD (PORK) LOCK FLUSH 100 UNIT/ML IV SOLN
500.0000 [IU] | Freq: Once | INTRAVENOUS | Status: AC
  Administered 2022-03-29: 500 [IU] via INTRAVENOUS
  Filled 2022-03-29: qty 5

## 2022-03-29 NOTE — Patient Instructions (Signed)

## 2022-03-29 NOTE — Progress Notes (Signed)
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  He underwent CT angios chest which did not show any PE.  Soft tissue attenuation measuring 2.9 x 4 cm centered in the left hilum resulting in abrupt angulation and narrowing of the pulmonary arteries and the central left lower lobe airways.  Additional ipsilateral hilar and subcarinal adenopathy.  No contralateral adenopathy.  Consolidative masslike opacity 3.6 x 3.1 cm in size and contiguous with more central perihilar soft tissue attenuation.  Overall findings concerning for primary bronchogenic carcinoma.    Patient lives with his sister who is his main caregiver.  He does not drive but is independent of his ADLs.  Reports ongoing fatigue and occasional retrosternal chest pain.  He has been evaluated by GI in the past for reflux as well.  Appetie is fair and weight is stable.  Denies any significant shortness of breath at this time.  Patient is an ex-smoker and smoked for about 20 years but quit smoking back in 1994   PET CT scan showed hypermetabolic mass in the left lower lobe with mediastinal adenopathy and right paratracheal adenopathy.  No evidence of distant metastatic disease.  Pathology was consistent with non-small cell lung cancer favor adenocarcinoma.  Cells were positive for TTF-1 and negative for p40.   Patient  started concurrent carbotaxol radiation and then had allergic reaction to Taxol for which she was switched to Abraxane.  Given the short supply of Abraxane patient received carbo Alimta midway through his concurrent chemoradiation.  Scans post chemoradiation showed partial response and patient will be started on maintenance durvalumab on 01/14/2020.  Patient was on maintenance durvalumab and received 15 cycles up until August 2022.  He was noted to have worsening chest pain and was in the ER multiple times in September 2022.  Repeat CT scan showed concern for hilar recurrence.  Patient received palliative radiation to the growing left upper lobe lung massWas given carbo Alimta for 4 cycles.  He was on maintenance Alimta which was stopped in December 2023 due to worsening anemia and AKI  Interval history-he is doing well presently.  Has baseline mild fatigue and exertional shortness of breath.  Denies any chest wall pain  ECOG PS- 1 Pain scale- 0 Opioid associated constipation- no  Review of systems- Review of Systems  Constitutional:  Positive for malaise/fatigue. Negative for chills, fever and weight loss.  HENT:  Negative for congestion, ear discharge and nosebleeds.   Eyes:  Negative for blurred vision.  Respiratory:  Negative for cough, hemoptysis, sputum production, shortness of breath and wheezing.   Cardiovascular:  Negative for chest pain, palpitations, orthopnea and claudication.  Gastrointestinal:  Negative for abdominal pain, blood in stool, constipation, diarrhea, heartburn, melena, nausea and vomiting.  Genitourinary:  Negative for dysuria, flank pain, frequency, hematuria and urgency.  Musculoskeletal:  Negative for back pain, joint pain and myalgias.  Skin:  Negative for rash.  Neurological:  Negative for dizziness, tingling, focal weakness, seizures, weakness and headaches.  Endo/Heme/Allergies:  Does not bruise/bleed easily.  Psychiatric/Behavioral:  Negative for depression and  suicidal ideas. The patient does not have insomnia.       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 ([place])      Past Surgical History:  Procedure Laterality Date   ESOPHAGOGASTRODUODENOSCOPY (EGD) WITH PROPOFOL N/A 12/01/2020   Procedure: ESOPHAGOGASTRODUODENOSCOPY (EGD) WITH PROPOFOL;  Surgeon: Lin Landsman, MD;  Location: [facility] ENDOSCOPY;  Service: Gastroenterology;  Laterality: N/A;   PORTA CATH INSERTION N/A 10/22/2019   Procedure: PORTA CATH INSERTION;  Surgeon: Katha Cabal, MD;  Location: [place] [facility];  Service: Cardiovascular;  Laterality: N/A;   VIDEO BRONCHOSCOPY WITH ENDOBRONCHIAL ULTRASOUND N/A 09/25/2019   Procedure: VIDEO BRONCHOSCOPY WITH ENDOBRONCHIAL ULTRASOUND;  Surgeon: Tyler Pita, MD;  Location: [facility] ORS;  Service: Pulmonary;  Laterality: N/A;    Social History   Socioeconomic History   Marital status: Single    Spouse name: Not on file   Number of children: Not on file   Years of education: Not on file   Highest education level: Not on file  Occupational History   Not on file  Tobacco Use   Smoking status: Former    Packs/day: 1.00    Years: 20.00    Total pack years: 20.00    Types: Cigarettes    Quit date: 71    Years since quitting: 30.1   Smokeless tobacco: Never  Vaping Use   Vaping Use: Never used  Substance and Sexual Activity   Alcohol use: Not Currently    Comment: not drank any beer in 2 onths   Drug use: Never   Sexual activity: Not Currently  Other Topics Concern   Not on file  Social History Narrative   Not on file   Social Determinants of Health   Financial Resource Strain: Not on file  Food Insecurity: Not on file  Transportation Needs: Unmet Transportation Needs (03/29/2022)    PRAPARE - Hydrologist (Medical): Yes    Lack of Transportation (Non-Medical): Yes  Physical Activity: Not on file  Stress: Not on file  Social Connections: Not on file  Intimate Partner Violence: Not on file    Family History  Problem Relation Age of Onset   Cancer Brother      Current Outpatient Medications:    acetaminophen (TYLENOL) 500 MG tablet, Take 1,000 mg by mouth every 6 (six) hours as needed for mild pain, moderate pain or headache., Disp: , Rfl:    albuterol (VENTOLIN HFA) 108 (90 Base) MCG/ACT inhaler, Inhale 2 puffs into the lungs every 4 (four) hours as needed for wheezing or shortness of breath., Disp: 8 g, Rfl: 1   ANORO ELLIPTA 62.5-25 MCG/ACT AEPB, INHALE 1 PUFF INTO THE LUNGS DAILY, Disp: 60 each, Rfl: 2   Budeson-Glycopyrrol-Formoterol (BREZTRI AEROSPHERE) 160-9-4.8 MCG/ACT AERO, Inhale 2 puffs into the lungs in the morning and at bedtime., Disp: 10.7 g, Rfl: 5   FLOVENT HFA 220 MCG/ACT inhaler, Inhale 2 puffs into the lungs 2 (two) times daily., Disp: , Rfl:    lidocaine-prilocaine (EMLA) cream, Apply 1 application topically as needed.,  Disp: 30 g, Rfl: 3   potassium chloride SA (KLOR-CON M) 20 MEQ tablet, TAKE 1 TABLET(20 MEQ) BY MOUTH TWICE DAILY, Disp: 20 tablet, Rfl: 0   folic acid (FOLVITE) 1 MG tablet, Take 1 tablet (1 mg total) by mouth daily. (Patient not taking: Reported on 01/14/2022), Disp: 30 tablet, Rfl: 3   lidocaine (LIDODERM) 5 %, Place 1 patch onto the skin daily. Remove & Discard patch within 12 hours or as directed by MD (Patient not taking: Reported on 05/07/2021), Disp: 30 patch, Rfl: 0   naloxegol oxalate (MOVANTIK) 25 MG TABS tablet, Take 1 tablet (25 mg total) by mouth daily. (Patient not taking: Reported on 05/07/2021), Disp: 30 tablet, Rfl: 2  Physical exam:  Vitals:   03/29/22 1310  BP: (!) 155/98  Pulse: 93  Resp: 18  Temp: (!) 96.8 F (36 C)  TempSrc: Tympanic  SpO2: 100%  Weight: 171 lb 11.2 oz (77.9  kg)  Height: 5\' 10"  (1.778 m)   Physical Exam Constitutional:      Appearance: He is not toxic-appearing.  Cardiovascular:     Rate and Rhythm: Normal rate and regular rhythm.     Heart sounds: Normal heart sounds.  Pulmonary:     Effort: Pulmonary effort is normal.     Breath sounds: Normal breath sounds.  Abdominal:     General: Bowel sounds are normal.     Palpations: Abdomen is soft.  Skin:    General: Skin is warm and dry.  Neurological:     Mental Status: He is alert and oriented to person, place, and time.         Latest Ref Rng & Units 03/29/2022   12:38 PM  CMP  Glucose 70 - 99 mg/dL 120   BUN 8 - 23 mg/dL 16   Creatinine 0.61 - 1.24 mg/dL 1.07   Sodium 135 - 145 mmol/L 139   Potassium 3.5 - 5.1 mmol/L 3.5   Chloride 98 - 111 mmol/L 110   CO2 22 - 32 mmol/L 23   Calcium 8.9 - 10.3 mg/dL 8.9   Total Protein 6.5 - 8.1 g/dL 7.6   Total Bilirubin 0.3 - 1.2 mg/dL 0.5   Alkaline Phos 38 - 126 U/L 76   AST 15 - 41 U/L 20   ALT 0 - 44 U/L 6       Latest Ref Rng & Units 03/29/2022   12:38 PM  CBC  WBC 4.0 - 10.5 K/uL 6.2   Hemoglobin 13.0 - 17.0 g/dL 10.9   Hematocrit 39.0 - 52.0 % 35.4   Platelets 150 - 400 K/uL 277     No images are attached to the encounter.  CT CHEST ABDOMEN PELVIS W CONTRAST  Result Date: 03/21/2022 CLINICAL DATA:  Follow-up lung cancer treatment. Restaging LEFT lower lobe lung carcinoma diagnosed 2021. Post chemotherapy and radiation treatment. * Tracking Code: BO *. EXAM: CT CHEST, ABDOMEN, AND PELVIS WITH CONTRAST TECHNIQUE: Multidetector CT imaging of the chest, abdomen and pelvis was performed following the standard protocol during bolus administration of intravenous contrast. RADIATION DOSE REDUCTION: This exam was performed according to the departmental dose-optimization program which includes automated exposure control, adjustment of the mA and/or kV according to patient size and/or use of iterative reconstruction technique. CONTRAST:   27mL OMNIPAQUE IOHEXOL 300 MG/ML  SOLN COMPARISON:  12/17/2021 FINDINGS: CT CHEST FINDINGS Cardiovascular: Port in the anterior chest wall with tip in distal SVC. Pericardial thickening unchanged from prior. Mediastinum/Nodes: No axillary or supraclavicular adenopathy.  No mediastinal or hilar adenopathy. No pericardial fluid. Esophagus normal. Lungs/Pleura: Persistent peribronchial consolidation with air bronchograms about the LEFT hilum. Persistent LEFT pleural effusion. No new pulmonary nodularity. RIGHT lung is clear.  Trace pleural fluid on the RIGHT. Musculoskeletal: No aggressive osseous lesion. CT ABDOMEN AND PELVIS FINDINGS Hepatobiliary: No focal hepatic lesion. No biliary ductal dilatation. Gallbladder is normal. Common bile duct is normal. Pancreas: Round lesion the mid body of the pancreas measuring 9 mm unchanged comparison exam (image 59/2). No pancreatic duct dilatation. Spleen: Normal spleen Adrenals/urinary tract: Adrenal glands normal. Kidneys, ureters and bladder normal. Benign Bosniak 1 cyst of the RIGHT kidney again demonstrated. No follow-up recommended. Ureters and bladder normal. Stomach/Bowel: Stomach, small bowel, appendix, and cecum are normal. The colon and rectosigmoid colon are normal. Vascular/Lymphatic: Abdominal aorta is normal caliber. There is no retroperitoneal or periportal lymphadenopathy. No pelvic lymphadenopathy. Reproductive: Prostate unremarkable. Other: Small volume intraperitoneal free fluid superior to the Seminal vesicles has simple fluid attenuation (image 103/2) Musculoskeletal: No aggressive osseous lesion. IMPRESSION: CHEST IMPRESSION: 1. Stable post therapy change in the LEFT hemithorax. 2. No evidence of lung cancer recurrence or metastasis. 3. Stable pericardial thickening. PELVIS IMPRESSION: 1. No evidence of metastatic disease in the abdomen pelvis. 2. Small volume intraperitoneal free fluid superior to the Seminal vesicles. 3. 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 Recommend follow-up on routine surveillance. Electronically Signed   By: Suzy Bouchard M.D.   On: 03/21/2022 10:09     Assessment and plan- Patient is a 72 y.o. male with history of recurrent [name] lung cancer s/p concurrent chemoradiation with [place] Alimta 4 cycles followed by maintenance Alimta.  He has been off Alimta since 01/14/2022 and this is a routine follow-up visit to discuss CT scan results and further management  I have reviewed CT chest abdomen and pelvis images independently and discussed findings with the patient which shows postradiation changes in the right upper lobe and mediastinum.  No evidence of recurrent disease at this time.  Anemia has improved after stopping Alimta and thrombocytosis has resolved.  Renal functions are also better.  I am planning to continue watchful surveillance without any additional chemotherapy at this time.  I will see him back in 4 months with labs and scans prior.  He will require port flushes every 6 to 8 weeks.  He has had history of B12 deficiency in the past and has received B12 injections.  His last B12 level was normal and I am holding off on further injections at this time.  Will repeat levels again in 4 months   Visit Diagnosis 1. Malignant neoplasm of lower lobe of left lung ([place])   2. B12 deficiency   3. Normocytic anemia      Dr. Randa Evens, MD, MPH [facility] at [facility] [phone] 03/29/2022 4:16 PM

## 2022-03-29 NOTE — Progress Notes (Signed)
Patient does not have any concerns.

## 2022-04-01 ENCOUNTER — Other Ambulatory Visit: Payer: Self-pay | Admitting: Oncology

## 2022-04-12 ENCOUNTER — Inpatient Hospital Stay: Payer: 59

## 2022-04-12 VITALS — BP 133/94 | HR 68 | Temp 96.2°F | Resp 16

## 2022-04-12 DIAGNOSIS — C3432 Malignant neoplasm of lower lobe, left bronchus or lung: Secondary | ICD-10-CM | POA: Diagnosis not present

## 2022-04-12 DIAGNOSIS — E538 Deficiency of other specified B group vitamins: Secondary | ICD-10-CM

## 2022-04-12 MED ORDER — SODIUM CHLORIDE 0.9 % IV SOLN
Freq: Once | INTRAVENOUS | Status: AC
  Filled 2022-04-12: qty 250

## 2022-04-12 MED ORDER — SODIUM CHLORIDE 0.9 % IV SOLN
510.0000 mg | INTRAVENOUS | Status: DC
  Administered 2022-04-12: 510 mg via INTRAVENOUS
  Filled 2022-04-12: qty 17

## 2022-04-12 MED ORDER — HEPARIN SOD (PORK) LOCK FLUSH 100 UNIT/ML IV SOLN
500.0000 [IU] | Freq: Once | INTRAVENOUS | Status: AC | PRN
  Administered 2022-04-12: 500 [IU]
  Filled 2022-04-12: qty 5

## 2022-04-12 MED ORDER — SODIUM CHLORIDE 0.9% FLUSH
10.0000 mL | Freq: Once | INTRAVENOUS | Status: DC | PRN
  Filled 2022-04-12: qty 10

## 2022-04-12 NOTE — Patient Instructions (Signed)

## 2022-04-19 ENCOUNTER — Inpatient Hospital Stay: Payer: 59 | Attending: Radiation Oncology

## 2022-04-19 ENCOUNTER — Inpatient Hospital Stay: Payer: 59

## 2022-04-19 VITALS — BP 144/79 | HR 76 | Temp 97.8°F | Resp 18

## 2022-04-19 DIAGNOSIS — Z79899 Other long term (current) drug therapy: Secondary | ICD-10-CM | POA: Insufficient documentation

## 2022-04-19 DIAGNOSIS — D649 Anemia, unspecified: Secondary | ICD-10-CM | POA: Diagnosis present

## 2022-04-19 DIAGNOSIS — C3432 Malignant neoplasm of lower lobe, left bronchus or lung: Secondary | ICD-10-CM | POA: Diagnosis not present

## 2022-04-19 DIAGNOSIS — E538 Deficiency of other specified B group vitamins: Secondary | ICD-10-CM

## 2022-04-19 MED ORDER — SODIUM CHLORIDE 0.9% FLUSH
10.0000 mL | Freq: Once | INTRAVENOUS | Status: AC | PRN
  Administered 2022-04-19: 10 mL
  Filled 2022-04-19: qty 10

## 2022-04-19 MED ORDER — HEPARIN SOD (PORK) LOCK FLUSH 100 UNIT/ML IV SOLN
500.0000 [IU] | Freq: Once | INTRAVENOUS | Status: AC | PRN
  Administered 2022-04-19: 500 [IU]
  Filled 2022-04-19: qty 5

## 2022-04-19 MED ORDER — SODIUM CHLORIDE 0.9 % IV SOLN
Freq: Once | INTRAVENOUS | Status: AC
  Filled 2022-04-19: qty 250

## 2022-04-19 MED ORDER — SODIUM CHLORIDE 0.9 % IV SOLN
510.0000 mg | INTRAVENOUS | Status: DC
  Administered 2022-04-19: 510 mg via INTRAVENOUS
  Filled 2022-04-19: qty 510

## 2022-05-24 ENCOUNTER — Inpatient Hospital Stay: Payer: 59

## 2022-05-24 ENCOUNTER — Inpatient Hospital Stay: Payer: 59 | Attending: Radiation Oncology

## 2022-05-24 DIAGNOSIS — D649 Anemia, unspecified: Secondary | ICD-10-CM | POA: Insufficient documentation

## 2022-05-24 DIAGNOSIS — Z452 Encounter for adjustment and management of vascular access device: Secondary | ICD-10-CM | POA: Insufficient documentation

## 2022-07-15 ENCOUNTER — Ambulatory Visit
Admission: RE | Admit: 2022-07-15 | Discharge: 2022-07-15 | Disposition: A | Discharge status: Home / Self Care | Payer: 59 | Source: Ambulatory Visit | Attending: Oncology | Admitting: Oncology

## 2022-07-15 DIAGNOSIS — D649 Anemia, unspecified: Secondary | ICD-10-CM | POA: Diagnosis present

## 2022-07-15 DIAGNOSIS — E538 Deficiency of other specified B group vitamins: Secondary | ICD-10-CM | POA: Insufficient documentation

## 2022-07-15 DIAGNOSIS — C3432 Malignant neoplasm of lower lobe, left bronchus or lung: Secondary | ICD-10-CM | POA: Insufficient documentation

## 2022-07-15 MED ORDER — IOHEXOL 350 MG/ML SOLN: 100.0000 mL | Freq: Once | INTRAVENOUS | Status: DC | PRN

## 2022-07-15 MED ORDER — IOHEXOL 300 MG/ML  SOLN
100.0000 mL | Freq: Once | INTRAMUSCULAR | Status: AC | PRN
  Administered 2022-07-15: 100 mL via INTRAVENOUS

## 2022-08-05 ENCOUNTER — Inpatient Hospital Stay: Payer: 59

## 2022-08-05 ENCOUNTER — Inpatient Hospital Stay (HOSPITAL_BASED_OUTPATIENT_CLINIC_OR_DEPARTMENT_OTHER): Payer: 59 | Admitting: Internal Medicine

## 2022-08-05 ENCOUNTER — Encounter: Payer: Self-pay | Admitting: Internal Medicine

## 2022-08-05 ENCOUNTER — Inpatient Hospital Stay: Payer: 59 | Attending: Radiation Oncology

## 2022-08-05 VITALS — BP 123/87 | HR 78 | Temp 98.0°F | Wt 177.7 lb

## 2022-08-05 DIAGNOSIS — Z79899 Other long term (current) drug therapy: Secondary | ICD-10-CM | POA: Diagnosis not present

## 2022-08-05 DIAGNOSIS — E785 Hyperlipidemia, unspecified: Secondary | ICD-10-CM | POA: Diagnosis not present

## 2022-08-05 DIAGNOSIS — R059 Cough, unspecified: Secondary | ICD-10-CM | POA: Insufficient documentation

## 2022-08-05 DIAGNOSIS — E538 Deficiency of other specified B group vitamins: Secondary | ICD-10-CM | POA: Diagnosis not present

## 2022-08-05 DIAGNOSIS — C3432 Malignant neoplasm of lower lobe, left bronchus or lung: Secondary | ICD-10-CM

## 2022-08-05 DIAGNOSIS — R0602 Shortness of breath: Secondary | ICD-10-CM | POA: Diagnosis not present

## 2022-08-05 DIAGNOSIS — D649 Anemia, unspecified: Secondary | ICD-10-CM

## 2022-08-05 DIAGNOSIS — Z7951 Long term (current) use of inhaled steroids: Secondary | ICD-10-CM | POA: Insufficient documentation

## 2022-08-05 DIAGNOSIS — R072 Precordial pain: Secondary | ICD-10-CM | POA: Diagnosis not present

## 2022-08-05 DIAGNOSIS — Z95828 Presence of other vascular implants and grafts: Secondary | ICD-10-CM

## 2022-08-05 DIAGNOSIS — I10 Essential (primary) hypertension: Secondary | ICD-10-CM | POA: Diagnosis not present

## 2022-08-05 DIAGNOSIS — Z87891 Personal history of nicotine dependence: Secondary | ICD-10-CM | POA: Diagnosis not present

## 2022-08-05 LAB — CBC WITH DIFFERENTIAL/PLATELET
Abs Immature Granulocytes: 0.03 10*3/uL (ref 0.00–0.07)
Basophils Absolute: 0 10*3/uL (ref 0.0–0.1)
Basophils Relative: 0 %
Eosinophils Absolute: 0.1 10*3/uL (ref 0.0–0.5)
Eosinophils Relative: 1 %
HCT: 37.5 % — ABNORMAL LOW (ref 39.0–52.0)
Hemoglobin: 11.8 g/dL — ABNORMAL LOW (ref 13.0–17.0)
Immature Granulocytes: 1 %
Lymphocytes Relative: 18 %
Lymphs Abs: 1.1 10*3/uL (ref 0.7–4.0)
MCH: 26.4 pg (ref 26.0–34.0)
MCHC: 31.5 g/dL (ref 30.0–36.0)
MCV: 83.9 fL (ref 80.0–100.0)
Monocytes Absolute: 0.5 10*3/uL (ref 0.1–1.0)
Monocytes Relative: 8 %
Neutro Abs: 4.7 10*3/uL (ref 1.7–7.7)
Neutrophils Relative %: 72 %
Platelets: 289 10*3/uL (ref 150–400)
RBC: 4.47 MIL/uL (ref 4.22–5.81)
RDW: 15.9 % — ABNORMAL HIGH (ref 11.5–15.5)
WBC: 6.4 10*3/uL (ref 4.0–10.5)
nRBC: 0 % (ref 0.0–0.2)

## 2022-08-05 LAB — COMPREHENSIVE METABOLIC PANEL
ALT: 10 U/L (ref 0–44)
AST: 16 U/L (ref 15–41)
Albumin: 3.3 g/dL — ABNORMAL LOW (ref 3.5–5.0)
Alkaline Phosphatase: 74 U/L (ref 38–126)
Anion gap: 9 (ref 5–15)
BUN: 18 mg/dL (ref 8–23)
CO2: 22 mmol/L (ref 22–32)
Calcium: 9 mg/dL (ref 8.9–10.3)
Chloride: 108 mmol/L (ref 98–111)
Creatinine, Ser: 1.24 mg/dL (ref 0.61–1.24)
GFR, Estimated: >60 mL/min (ref >60)
Glucose, Bld: 126 mg/dL — ABNORMAL HIGH (ref 70–99)
Potassium: 3.7 mmol/L (ref 3.5–5.1)
Sodium: 139 mmol/L (ref 135–145)
Total Bilirubin: 0.9 mg/dL (ref 0.3–1.2)
Total Protein: 8 g/dL (ref 6.5–8.1)

## 2022-08-05 MED ORDER — SODIUM CHLORIDE 0.9% FLUSH
10.0000 mL | Freq: Once | INTRAVENOUS | Status: AC
  Administered 2022-08-05: 10 mL via INTRAVENOUS
  Filled 2022-08-05: qty 10

## 2022-08-05 MED ORDER — HEPARIN SOD (PORK) LOCK FLUSH 100 UNIT/ML IV SOLN
500.0000 [IU] | Freq: Once | INTRAVENOUS | Status: AC
  Administered 2022-08-05: 500 [IU] via INTRAVENOUS
  Filled 2022-08-05: qty 5

## 2022-08-05 NOTE — Progress Notes (Signed)
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  He underwent CT angios chest which did not show any PE.  Soft tissue attenuation measuring 2.9 x 4 cm centered in the left hilum resulting in abrupt angulation and narrowing of the pulmonary arteries and the central left lower lobe airways.  Additional ipsilateral hilar and subcarinal adenopathy.  No contralateral adenopathy.  Consolidative masslike opacity 3.6 x 3.1 cm in size and contiguous with more central perihilar soft tissue attenuation.  Overall findings concerning for primary bronchogenic carcinoma.    Patient lives with his sister who is his main caregiver.  He does not drive but is independent of his ADLs.  Reports ongoing fatigue and occasional retrosternal chest pain.  He has been evaluated by GI in the past for reflux as well.  Appetie is fair and weight is stable.  Denies any significant shortness of breath at this time.  Patient is an ex-smoker and smoked for about 20 years but quit smoking back in 1994   PET CT scan showed hypermetabolic mass in the left lower lobe with mediastinal adenopathy and right paratracheal adenopathy.  No evidence of distant metastatic disease.  Pathology was consistent with non-small cell lung cancer favor adenocarcinoma.  Cells  were positive for TTF-1 and negative for p40.   Patient started concurrent carbotaxol radiation and then had allergic reaction to Taxol for which she was switched to Abraxane.  Given the short supply of Abraxane patient received carbo Alimta midway through his concurrent chemoradiation.  Scans post chemoradiation showed partial response and patient will be started on maintenance durvalumab on 01/14/2020.  Patient was on maintenance durvalumab and received 15 cycles up until August 2022.  He was noted to have worsening chest pain and was in the ER multiple times in September 2022.  Repeat CT scan showed concern for hilar recurrence.  Patient received palliative radiation to the growing left upper lobe lung massWas given carbo Alimta for 4 cycles.  He was on maintenance Alimta which was stopped in December 2023 due to worsening anemia and AKI  Interval history- Patient was seen today as follow-up, labs and to discuss CT scan results. He has been doing well overall.  Has chronic shortness of breath on exertion.  No worsening.  Denies any cough, fever, chills, nausea, vomiting.  Eating better.  Energy level is good.  ECOG PS- 1 Pain scale- 0 Opioid associated constipation- no  Review of systems- Review of Systems  Constitutional:  Negative for chills, fever and weight loss.  HENT:  Negative for congestion, ear discharge and nosebleeds.   Eyes:  Negative for blurred vision.  Respiratory:  Negative for cough, hemoptysis, sputum production, shortness of breath and wheezing.   Cardiovascular:  Negative for chest pain, palpitations, orthopnea and claudication.  Gastrointestinal:  Negative for abdominal pain, blood in stool, constipation, diarrhea, heartburn, melena, nausea and  vomiting.  Genitourinary:  Negative for dysuria, flank pain, frequency, hematuria and urgency.  Musculoskeletal:  Negative for back pain, joint pain and myalgias.  Skin:  Negative for rash.  Neurological:  Negative for dizziness,  tingling, focal weakness, seizures, weakness and headaches.  Endo/Heme/Allergies:  Does not bruise/bleed easily.  Psychiatric/Behavioral:  Negative for depression and suicidal ideas. The patient does not have insomnia.       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 (HCC)      Past Surgical History:  Procedure Laterality Date   ESOPHAGOGASTRODUODENOSCOPY (EGD) WITH PROPOFOL N/A 12/01/2020   Procedure: ESOPHAGOGASTRODUODENOSCOPY (EGD) WITH PROPOFOL;  Surgeon: Toney Reil, MD;  Location: [facility] ENDOSCOPY;  Service: Gastroenterology;  Laterality: N/A;   PORTA CATH INSERTION N/A 10/22/2019   Procedure: PORTA CATH INSERTION;  Surgeon: Renford Dills, MD;  Location: [facility] INVASIVE CV LAB;  Service: Cardiovascular;  Laterality: N/A;   VIDEO BRONCHOSCOPY WITH ENDOBRONCHIAL ULTRASOUND N/A 09/25/2019   Procedure: VIDEO BRONCHOSCOPY WITH ENDOBRONCHIAL ULTRASOUND;  Surgeon: Salena Saner, MD;  Location: [facility] ORS;  Service: Pulmonary;  Laterality: N/A;    Social History   Socioeconomic History   Marital status: Single    Spouse name: Not on file   Number of children: Not on file   Years of education: Not on file   Highest education level: Not on file  Occupational History   Not on file  Tobacco Use   Smoking status: Former    Packs/day: 1.00    Years: 20.00    Additional pack years: 0.00    Total pack years: 20.00    Types: Cigarettes    Quit date: 55    Years since quitting: 30.4   Smokeless tobacco: Never  Vaping Use   Vaping Use: Never used  Substance and Sexual Activity   Alcohol use: Not Currently    Comment: not drank any beer in 2 onths   Drug use: Never   Sexual activity: Not Currently  Other Topics Concern   Not on file  Social  History Narrative   Not on file   Social Determinants of Health   Financial Resource Strain: Not on file  Food Insecurity: Not on file  Transportation Needs: Unmet Transportation Needs (08/05/2022)   PRAPARE - Administrator, Civil Service (Medical): Yes    Lack of Transportation (Non-Medical): Yes  Physical Activity: Not on file  Stress: Not on file  Social Connections: Not on file  Intimate Partner Violence: Not on file    Family History  Problem Relation Age of Onset   Cancer Brother      Current Outpatient Medications:    acetaminophen (TYLENOL) 500 MG tablet, Take 1,000 mg by mouth every 6 (six) hours as needed for mild pain, moderate pain or headache. (Patient not taking: Reported on 08/05/2022), Disp: , Rfl:    albuterol (VENTOLIN HFA) 108 (90 Base) MCG/ACT inhaler, Inhale 2 puffs into the lungs every 4 (four) hours as needed for wheezing or shortness of breath. (Patient not taking: Reported on 08/05/2022), Disp: 8 g, Rfl: 1   ANORO ELLIPTA 62.5-25 MCG/ACT AEPB, INHALE 1 PUFF INTO THE LUNGS DAILY (Patient not taking: Reported on 08/05/2022), Disp: 60 each, Rfl: 2  Budeson-Glycopyrrol-Formoterol (BREZTRI AEROSPHERE) 160-9-4.8 MCG/ACT AERO, Inhale 2 puffs into the lungs in the morning and at bedtime. (Patient not taking: Reported on 08/05/2022), Disp: 10.7 g, Rfl: 5   FLOVENT HFA 220 MCG/ACT inhaler, Inhale 2 puffs into the lungs 2 (two) times daily. (Patient not taking: Reported on 08/05/2022), Disp: , Rfl:    folic acid (FOLVITE) 1 MG tablet, Take 1 tablet (1 mg total) by mouth daily. (Patient not taking: Reported on 01/14/2022), Disp: 30 tablet, Rfl: 3   lidocaine (LIDODERM) 5 %, Place 1 patch onto the skin daily. Remove & Discard patch within 12 hours or as directed by MD (Patient not taking: Reported on 05/07/2021), Disp: 30 patch, Rfl: 0   lidocaine-prilocaine (EMLA) cream, Apply 1 application topically as needed. (Patient not taking: Reported on 08/05/2022), Disp: 30 g,  Rfl: 3   naloxegol oxalate (MOVANTIK) 25 MG TABS tablet, Take 1 tablet (25 mg total) by mouth daily. (Patient not taking: Reported on 05/07/2021), Disp: 30 tablet, Rfl: 2   potassium chloride SA (KLOR-CON M) 20 MEQ tablet, TAKE 1 TABLET(20 MEQ) BY MOUTH TWICE DAILY (Patient not taking: Reported on 08/05/2022), Disp: 20 tablet, Rfl: 0  Physical exam:  Vitals:   08/05/22 1017  BP: 123/87  Pulse: 78  Temp: 98 F (36.7 C)  TempSrc: Tympanic  SpO2: 100%  Weight: 177 lb 11.2 oz (80.6 kg)   Physical Exam Constitutional:      Appearance: He is not toxic-appearing.  Cardiovascular:     Rate and Rhythm: Normal rate and regular rhythm.     Heart sounds: Normal heart sounds.  Pulmonary:     Effort: Pulmonary effort is normal.     Breath sounds: Normal breath sounds.  Abdominal:     General: Bowel sounds are normal.     Palpations: Abdomen is soft.  Skin:    General: Skin is warm and dry.  Neurological:     Mental Status: He is alert and oriented to person, place, and time.         Latest Ref Rng & Units 08/05/2022    9:55 AM  CMP  Glucose 70 - 99 mg/dL 875   BUN 8 - 23 mg/dL 18   Creatinine 6.43 - 1.24 mg/dL 3.29   Sodium 518 - 841 mmol/L 139   Potassium 3.5 - 5.1 mmol/L 3.7   Chloride 98 - 111 mmol/L 108   CO2 22 - 32 mmol/L 22   Calcium 8.9 - 10.3 mg/dL 9.0   Total Protein 6.5 - 8.1 g/dL 8.0   Total Bilirubin 0.3 - 1.2 mg/dL 0.9   Alkaline Phos 38 - 126 U/L 74   AST 15 - 41 U/L 16   ALT 0 - 44 U/L 10       Latest Ref Rng & Units 08/05/2022    9:55 AM  CBC  WBC 4.0 - 10.5 K/uL 6.4   Hemoglobin 13.0 - 17.0 g/dL 66.0   Hematocrit 63.0 - 52.0 % 37.5   Platelets 150 - 400 K/uL 289     No images are attached to the encounter.  CT CHEST ABDOMEN PELVIS W CONTRAST  Result Date: 07/20/2022 CLINICAL DATA:  [name] right lung adenocarcinoma, separate left lower lobe lung cancer. Restaging * Tracking Code: BO * EXAM: CT CHEST, ABDOMEN, AND PELVIS WITH CONTRAST TECHNIQUE:  Multidetector CT imaging of the chest, abdomen and pelvis was performed following the standard protocol during bolus administration of intravenous contrast. RADIATION DOSE REDUCTION: This exam was performed according  to the departmental dose-optimization program which includes automated exposure control, adjustment of the mA and/or kV according to patient size and/or use of iterative reconstruction technique. CONTRAST:  OMNIPAQUE IOHEXOL 300 MG/ML  SOLN COMPARISON:  03/21/2022 CT and older FINDINGS: CT CHEST FINDINGS Cardiovascular: Small to moderate pericardial effusion is similar to previous. The heart is nonenlarged. Normal caliber thoracic aorta with minimal atherosclerotic plaque. Bovine type aortic arch, normal variant. There is a right IJ chest port with the tip extending to the right atrium. Mediastinum/Nodes: No specific abnormal lymph node enlargement identified in the axillary regions, hilum or mediastinum. There is some edema in the mediastinum, similar to previous. Thyroid gland is unremarkable. Slightly patulous esophagus. Lungs/Pleura: Right lung has some areas of emphysematous changes, bronchiectasis and peripheral interstitial septal thickening. There is also some pleural thickening along the medial anterior right upper lobe, unchanged from previous. The tiny right pleural effusion on the prior is improving. Mild right basilar atelectasis. No new dominant right-sided lung nodule. Left lung has a persistent small left pleural effusion with adjacent mild bandlike opacity and pleural thickening with scarring. More confluence opacity identified towards the lingula with bronchiectasis and some distortion. The adjacent left lower lobes also involved with this area and is unchanged. This extends back to the hilum. Musculoskeletal: Mild degenerative changes along the spine. Mild levoconvex curvature of the upper thoracic spine. CT ABDOMEN PELVIS FINDINGS Hepatobiliary: Fatty liver infiltration. No  space-occupying liver lesion. Patent portal vein. Gallbladder is nondilated. Pancreas: Cystic lesion along the midbody of the pancreas is again seen. Today measuring 11 x 12 mm and if the prior studies remeasured in the same fashion is today lesion is not significantly changed. This has been present since at least November 2021. At that time lesion measured 10 mm. Only minimally changed. Recommend continued surveillance in 2 years. Spleen: Normal in size without focal abnormality. Adrenals/Urinary Tract: Adrenal glands are preserved. Lower pole 3 cm low-attenuation right renal lesion identified with Hounsfield unit of 16 consistent with a Bosniak 1 cystic focus. No specific imaging follow-up. Few tiny areas elsewhere in addition, Bosniak 2 lesions. No specific imaging follow-up. No collecting system dilatation. The ureters have a normal course and caliber down to the normal caliber bladder. Slight wall thickening of the bladder. Stomach/Bowel: No oral contrast. Moderate diffuse colonic stool. No bowel obstruction, free air or free fluid. Stomach and small bowel are nondilated. Vascular/Lymphatic: Aortic atherosclerosis. No enlarged abdominal or pelvic lymph nodes. Reproductive: Enlarged prostate with mass effect along the base of the bladder. Stable prominent seminal vesicles. The right testicle appears to be along the right inguinal canal. Please correlate with clinical findings. Other: No free air or free fluid. Musculoskeletal: Curvature of the spine with scattered degenerative changes of the spine and pelvis. IMPRESSION: Overall no significant interval change. Stable left-sided pleural effusion with opacity and some distortion with volume loss. Persistent moderate pericardial effusion. Decreasing small right pleural effusion. Fatty liver infiltration. Stable midbody pancreatic cystic focus. Recommend continued follow up in 2 years based on appearance and patient's age. Electronically Signed   By: Karen Kays  M.D.   On: 07/20/2022 11:45     Assessment and plan- Patient is a 72 y.o. male with history of recurrent [name] lung cancer s/p concurrent chemoradiation with [place] Alimta 4 cycles followed by maintenance Alimta.  He has been off Alimta since 01/14/2022 and this is a routine follow-up visit to discuss CT scan results and further management  CT chest abdomen pelvis from  07/15/2022 showed overall no significant interval change.  No new concerns for recurrence.  Stable left-sided pleural effusion.  Persistent moderate pericardial effusion.  Decreasing small right pleural effusion.  Stable mid body pancreatic cystic focus.  Imaging findings were discussed with the patient.  Will continue with surveillance.  Will schedule for repeat CT chest abdomen pelvis with contrast in 4 months and follow-up with Dr. Smith Robert with labs. He will require port flushes every 6 to 8 weeks.  He has had history of B12 deficiency in the past and has received B12 injections.  B12 level is pending.  Will repeat levels again in 4 months   Visit Diagnosis 1. 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)

## 2022-08-05 NOTE — Progress Notes (Signed)
[name] one of Dr. Lurene Shadow patients, today Dr. Alena Bills is covering her patients in her absence and the patient is aware. Patient had his CT scan on 07/15/2022. He states that his appetite is much better than it used to be. He says that he isn't taking any of his prescription medications. He is having some shortness of breathe, which he informs me that is an ongoing condition/concern.

## 2022-08-14 ENCOUNTER — Emergency Department: Payer: 59

## 2022-08-14 ENCOUNTER — Emergency Department
Admission: EM | Admit: 2022-08-14 | Discharge: 2022-08-14 | Disposition: A | Discharge status: Home / Self Care | Payer: 59 | Attending: Emergency Medicine | Admitting: Emergency Medicine

## 2022-08-14 ENCOUNTER — Other Ambulatory Visit: Payer: Self-pay

## 2022-08-14 DIAGNOSIS — I6782 Cerebral ischemia: Secondary | ICD-10-CM | POA: Diagnosis not present

## 2022-08-14 DIAGNOSIS — R42 Dizziness and giddiness: Secondary | ICD-10-CM | POA: Diagnosis not present

## 2022-08-14 DIAGNOSIS — R079 Chest pain, unspecified: Secondary | ICD-10-CM | POA: Diagnosis not present

## 2022-08-14 DIAGNOSIS — R791 Abnormal coagulation profile: Secondary | ICD-10-CM | POA: Diagnosis not present

## 2022-08-14 DIAGNOSIS — Z85118 Personal history of other malignant neoplasm of bronchus and lung: Secondary | ICD-10-CM | POA: Insufficient documentation

## 2022-08-14 DIAGNOSIS — I10 Essential (primary) hypertension: Secondary | ICD-10-CM | POA: Diagnosis not present

## 2022-08-14 LAB — COMPREHENSIVE METABOLIC PANEL
ALT: 9 U/L (ref 0–44)
AST: 16 U/L (ref 15–41)
Albumin: 3.8 g/dL (ref 3.5–5.0)
Alkaline Phosphatase: 74 U/L (ref 38–126)
Anion gap: 12 (ref 5–15)
BUN: 16 mg/dL (ref 8–23)
CO2: 17 mmol/L — ABNORMAL LOW (ref 22–32)
Calcium: 9.1 mg/dL (ref 8.9–10.3)
Chloride: 109 mmol/L (ref 98–111)
Creatinine, Ser: 1.2 mg/dL (ref 0.61–1.24)
GFR, Estimated: >60 mL/min (ref >60)
Glucose, Bld: 128 mg/dL — ABNORMAL HIGH (ref 70–99)
Potassium: 3.8 mmol/L (ref 3.5–5.1)
Sodium: 138 mmol/L (ref 135–145)
Total Bilirubin: 1.1 mg/dL (ref 0.3–1.2)
Total Protein: 8.2 g/dL — ABNORMAL HIGH (ref 6.5–8.1)

## 2022-08-14 LAB — ETHANOL: Alcohol, Ethyl (B): <10 mg/dL (ref <10)

## 2022-08-14 LAB — DIFFERENTIAL
Abs Immature Granulocytes: 0.03 10*3/uL (ref 0.00–0.07)
Basophils Absolute: 0 10*3/uL (ref 0.0–0.1)
Basophils Relative: 0 %
Eosinophils Absolute: 0 10*3/uL (ref 0.0–0.5)
Eosinophils Relative: 0 %
Immature Granulocytes: 0 %
Lymphocytes Relative: 27 %
Lymphs Abs: 2.1 10*3/uL (ref 0.7–4.0)
Monocytes Absolute: 0.6 10*3/uL (ref 0.1–1.0)
Monocytes Relative: 7 %
Neutro Abs: 4.9 10*3/uL (ref 1.7–7.7)
Neutrophils Relative %: 66 %

## 2022-08-14 LAB — PROTIME-INR
INR: 1.1 (ref 0.8–1.2)
Prothrombin Time: 14.3 seconds (ref 11.4–15.2)

## 2022-08-14 LAB — CBC
HCT: 40.8 % (ref 39.0–52.0)
Hemoglobin: 12.6 g/dL — ABNORMAL LOW (ref 13.0–17.0)
MCH: 26.1 pg (ref 26.0–34.0)
MCHC: 30.9 g/dL (ref 30.0–36.0)
MCV: 84.5 fL (ref 80.0–100.0)
Platelets: 340 10*3/uL (ref 150–400)
RBC: 4.83 MIL/uL (ref 4.22–5.81)
RDW: 15.5 % (ref 11.5–15.5)
WBC: 7.7 10*3/uL (ref 4.0–10.5)
nRBC: 0 % (ref 0.0–0.2)

## 2022-08-14 LAB — APTT: aPTT: 29 seconds (ref 24–36)

## 2022-08-14 LAB — TROPONIN I (HIGH SENSITIVITY): Troponin I (High Sensitivity): 8 ng/L (ref <18)

## 2022-08-14 MED ORDER — SODIUM CHLORIDE 0.9 % IV BOLUS
500.0000 mL | Freq: Once | INTRAVENOUS | Status: AC
  Administered 2022-08-14: 500 mL via INTRAVENOUS

## 2022-08-14 MED ORDER — ONDANSETRON HCL 4 MG/2ML IJ SOLN
4.0000 mg | Freq: Once | INTRAMUSCULAR | Status: AC
  Administered 2022-08-14: 4 mg via INTRAVENOUS
  Filled 2022-08-14: qty 2

## 2022-08-14 MED ORDER — SODIUM CHLORIDE 0.9% FLUSH: 3.0000 mL | Freq: Once | INTRAVENOUS | Status: DC

## 2022-08-14 MED ORDER — MECLIZINE HCL 25 MG PO TABS: 25.0000 mg | ORAL_TABLET | Freq: Three times a day (TID) | ORAL | 0 refills | Status: DC | PRN

## 2022-08-14 MED ORDER — MECLIZINE HCL 25 MG PO TABS
25.0000 mg | ORAL_TABLET | Freq: Once | ORAL | Status: AC
  Administered 2022-08-14: 25 mg via ORAL
  Filled 2022-08-14: qty 1

## 2022-08-14 NOTE — ED Provider Notes (Signed)
[facility] Provider Note    Event Date/Time   First MD Initiated Contact with Patient 08/14/22 1601     (approximate)   History   Dizziness (Since last night)   HPI  Mitchell Frye is a 72 y.o. male with a history of hypertension, malignant neoplasm of the lower left lung, treated who presents with dizziness.  Patient reports yesterday evening he felt like he was on a merry-go-round all of a sudden.  He went to sleep and symptoms persisted today.  He reports he had something similar 9 months ago and was treated here in the emergency department with a medicine that made it better.  Denies nausea or vomiting.  No neurodeficits.  No headache.     Physical Exam   Triage Vital Signs: ED Triage Vitals [08/14/22 1547]  Enc Vitals Group     BP (!) 147/95     Pulse Rate 99     Resp 16     Temp (!) 97.3 F (36.3 C)     Temp Source Oral     SpO2 100 %     Weight 80 kg (176 lb 5.9 oz)     Height 1.778 m (5\' 10" )     Head Circumference      Peak Flow      Pain Score 3     Pain Loc      Pain Edu?      Excl. in GC?     Most recent vital signs: Vitals:   08/14/22 1547  BP: (!) 147/95  Pulse: 99  Resp: 16  Temp: (!) 97.3 F (36.3 C)  SpO2: 100%     General: Awake, no distress.  CV:  Good peripheral perfusion.  Resp:  Normal effort.  Abd:  No distention.  Other:  Cranial nerves II to XII are normal, normal strength in all extremities.  Mild rightward nystagmus   ED Results / Procedures / Treatments   Labs (all labs ordered are listed, but only abnormal results are displayed) Labs Reviewed  CBC - Abnormal; Notable for the following components:      Result Value   Hemoglobin 12.6 (*)    All other components within normal limits  COMPREHENSIVE METABOLIC PANEL - Abnormal; Notable for the following components:   CO2 17 (*)    Glucose, Bld 128 (*)    Total Protein 8.2 (*)    All other components within normal limits  PROTIME-INR  APTT   DIFFERENTIAL  ETHANOL  TROPONIN I (HIGH SENSITIVITY)     EKG  ED ECG REPORT I, Jene Every, the attending physician, personally viewed and interpreted this ECG.  Date: 08/14/2022  Rhythm: normal sinus rhythm QRS Axis: Left axis deviation Intervals: normal ST/T Wave abnormalities: normal Narrative Interpretation: no evidence of acute ischemia    RADIOLOGY CT head viewed interpret by me, no acute abnormality, confirmed by radiology    PROCEDURES:  Critical Care performed:   Procedures   MEDICATIONS ORDERED IN ED: Medications  sodium chloride flush (NS) 0.9 % injection 3 mL (3 mLs Intravenous Not Given 08/14/22 1655)  sodium chloride 0.9 % bolus 500 mL (500 mLs Intravenous New Bag/Given 08/14/22 1655)  ondansetron (ZOFRAN) injection 4 mg (4 mg Intravenous Given 08/14/22 1655)  meclizine (ANTIVERT) tablet 25 mg (25 mg Oral Given 08/14/22 1654)     IMPRESSION / MDM / ASSESSMENT AND PLAN / ED COURSE  I reviewed the triage vital signs and the nursing notes. Patient's presentation is  most consistent with acute presentation with potential threat to life or bodily function.  Patient presents with dizziness as detailed above, no neurodeficits to suggest CVA, more likely vertigo, possibility of electrolyte abnormalities  Lab work reviewed and is reassuring, CT head is unremarkable.  Patient treated with IV Zofran, p.o. meclizine, IV fluids and vertigo has resolved.  No indication for admission at this time, appropriate for discharge with outpatient follow-up, return precautions discussed.        FINAL CLINICAL IMPRESSION(S) / ED DIAGNOSES   Final diagnoses:  Vertigo     Rx / DC Orders   ED Discharge Orders          Ordered    meclizine (ANTIVERT) 25 MG tablet  3 times daily PRN        08/14/22 1737             Note:  This document was prepared using Dragon voice recognition software and may include unintentional dictation errors.   Jene Every,  MD 08/14/22 [phone]

## 2022-08-14 NOTE — ED Triage Notes (Signed)
Pt to ed from home via POV for dizziness since last night with some chest pain. Pt is clammy in triage. Pt is CAOx4, in no acute distress and has unsteady gait, so placed in wheelchair in triaage.

## 2022-11-11 ENCOUNTER — Ambulatory Visit
Admission: RE | Admit: 2022-11-11 | Discharge: 2022-11-11 | Disposition: A | Discharge status: Home / Self Care | Payer: 59 | Source: Ambulatory Visit | Attending: Internal Medicine | Admitting: Internal Medicine

## 2022-11-11 DIAGNOSIS — C3432 Malignant neoplasm of lower lobe, left bronchus or lung: Secondary | ICD-10-CM | POA: Insufficient documentation

## 2022-11-11 MED ORDER — IOHEXOL 300 MG/ML  SOLN
100.0000 mL | Freq: Once | INTRAMUSCULAR | Status: AC | PRN
  Administered 2022-11-11: 100 mL via INTRAVENOUS

## 2022-12-05 ENCOUNTER — Other Ambulatory Visit: Payer: Self-pay

## 2022-12-05 ENCOUNTER — Telehealth: Payer: Self-pay

## 2022-12-05 ENCOUNTER — Ambulatory Visit: Payer: 59 | Admitting: Internal Medicine

## 2022-12-05 ENCOUNTER — Inpatient Hospital Stay (HOSPITAL_BASED_OUTPATIENT_CLINIC_OR_DEPARTMENT_OTHER): Payer: 59 | Admitting: Oncology

## 2022-12-05 ENCOUNTER — Inpatient Hospital Stay: Payer: 59 | Attending: Radiation Oncology

## 2022-12-05 ENCOUNTER — Encounter: Payer: Self-pay | Admitting: Oncology

## 2022-12-05 VITALS — BP 114/77 | HR 93 | Temp 97.3°F | Resp 17 | Wt 161.0 lb

## 2022-12-05 DIAGNOSIS — J90 Pleural effusion, not elsewhere classified: Secondary | ICD-10-CM

## 2022-12-05 DIAGNOSIS — Z95828 Presence of other vascular implants and grafts: Secondary | ICD-10-CM

## 2022-12-05 DIAGNOSIS — C3432 Malignant neoplasm of lower lobe, left bronchus or lung: Secondary | ICD-10-CM | POA: Diagnosis not present

## 2022-12-05 DIAGNOSIS — Z87891 Personal history of nicotine dependence: Secondary | ICD-10-CM | POA: Diagnosis not present

## 2022-12-05 DIAGNOSIS — D509 Iron deficiency anemia, unspecified: Secondary | ICD-10-CM | POA: Insufficient documentation

## 2022-12-05 DIAGNOSIS — Z23 Encounter for immunization: Secondary | ICD-10-CM | POA: Insufficient documentation

## 2022-12-05 DIAGNOSIS — Z7189 Other specified counseling: Secondary | ICD-10-CM | POA: Diagnosis not present

## 2022-12-05 DIAGNOSIS — N400 Enlarged prostate without lower urinary tract symptoms: Secondary | ICD-10-CM | POA: Insufficient documentation

## 2022-12-05 LAB — CBC WITH DIFFERENTIAL (CANCER CENTER ONLY)
Abs Immature Granulocytes: 0.05 10*3/uL (ref 0.00–0.07)
Basophils Absolute: 0 10*3/uL (ref 0.0–0.1)
Basophils Relative: 0 %
Eosinophils Absolute: 0 10*3/uL (ref 0.0–0.5)
Eosinophils Relative: 1 %
HCT: 30.4 % — ABNORMAL LOW (ref 39.0–52.0)
Hemoglobin: 8.8 g/dL — ABNORMAL LOW (ref 13.0–17.0)
Immature Granulocytes: 1 %
Lymphocytes Relative: 22 %
Lymphs Abs: 1.6 10*3/uL (ref 0.7–4.0)
MCH: 21.3 pg — ABNORMAL LOW (ref 26.0–34.0)
MCHC: 28.9 g/dL — ABNORMAL LOW (ref 30.0–36.0)
MCV: 73.6 fL — ABNORMAL LOW (ref 80.0–100.0)
Monocytes Absolute: 0.7 10*3/uL (ref 0.1–1.0)
Monocytes Relative: 10 %
Neutro Abs: 4.7 10*3/uL (ref 1.7–7.7)
Neutrophils Relative %: 66 %
Platelet Count: 484 10*3/uL — ABNORMAL HIGH (ref 150–400)
RBC: 4.13 MIL/uL — ABNORMAL LOW (ref 4.22–5.81)
RDW: 17.2 % — ABNORMAL HIGH (ref 11.5–15.5)
WBC Count: 7 10*3/uL (ref 4.0–10.5)
nRBC: 0 % (ref 0.0–0.2)

## 2022-12-05 LAB — CMP (CANCER CENTER ONLY)
ALT: 9 U/L (ref 0–44)
AST: 15 U/L (ref 15–41)
Albumin: 2.9 g/dL — ABNORMAL LOW (ref 3.5–5.0)
Alkaline Phosphatase: 60 U/L (ref 38–126)
Anion gap: 9 (ref 5–15)
BUN: 14 mg/dL (ref 8–23)
CO2: 23 mmol/L (ref 22–32)
Calcium: 8.9 mg/dL (ref 8.9–10.3)
Chloride: 104 mmol/L (ref 98–111)
Creatinine: 1.13 mg/dL (ref 0.61–1.24)
GFR, Estimated: >60 mL/min (ref >60)
Glucose, Bld: 114 mg/dL — ABNORMAL HIGH (ref 70–99)
Potassium: 3.7 mmol/L (ref 3.5–5.1)
Sodium: 136 mmol/L (ref 135–145)
Total Bilirubin: 0.7 mg/dL (ref 0.3–1.2)
Total Protein: 8.1 g/dL (ref 6.5–8.1)

## 2022-12-05 LAB — IRON AND TIBC
Iron: 21 ug/dL — ABNORMAL LOW (ref 45–182)
Saturation Ratios: 9 % — ABNORMAL LOW (ref 17.9–39.5)
TIBC: 223 ug/dL — ABNORMAL LOW (ref 250–450)
UIBC: 202 ug/dL

## 2022-12-05 LAB — FERRITIN: Ferritin: 408 ng/mL — ABNORMAL HIGH (ref 24–336)

## 2022-12-05 LAB — FOLATE: Folate: 5.9 ng/mL — ABNORMAL LOW (ref >5.9)

## 2022-12-05 MED ORDER — SODIUM CHLORIDE 0.9% FLUSH
10.0000 mL | Freq: Once | INTRAVENOUS | Status: AC
  Administered 2022-12-05: 10 mL via INTRAVENOUS
  Filled 2022-12-05: qty 10

## 2022-12-05 MED ORDER — HEPARIN SOD (PORK) LOCK FLUSH 100 UNIT/ML IV SOLN
500.0000 [IU] | Freq: Once | INTRAVENOUS | Status: AC
  Administered 2022-12-05: 500 [IU] via INTRAVENOUS
  Filled 2022-12-05: qty 5

## 2022-12-05 NOTE — Progress Notes (Signed)
Patient here for oncology follow-up appointment, expresses concerns of stomach cramps

## 2022-12-05 NOTE — Progress Notes (Signed)
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  He underwent CT angios chest which did not show any PE.  Soft tissue attenuation measuring 2.9 x 4 cm centered in the left hilum resulting in abrupt angulation and narrowing of the pulmonary arteries and the central left lower lobe airways.  Additional ipsilateral hilar and subcarinal adenopathy.  No contralateral adenopathy.  Consolidative masslike opacity 3.6 x 3.1 cm in size and contiguous with more central perihilar soft tissue attenuation.  Overall findings concerning for primary bronchogenic carcinoma.    Patient lives with his sister who is his main caregiver.  He does not drive but is independent of his ADLs.  Reports ongoing fatigue and occasional retrosternal chest pain.  He has been evaluated by GI in the past for reflux as well.  Appetie is fair and weight is stable.  Denies any significant shortness of breath at this time.  Patient is an ex-smoker and smoked for about 20 years but quit smoking back in 1994   PET CT scan showed hypermetabolic mass in the left lower lobe with mediastinal adenopathy and right paratracheal adenopathy.  No evidence of distant metastatic disease.  Pathology was consistent with non-small cell lung cancer favor adenocarcinoma.  Cells  were positive for TTF-1 and negative for p40.   Patient started concurrent carbotaxol radiation and then had allergic reaction to Taxol for which she was switched to Abraxane.  Given the short supply of Abraxane patient received carbo Alimta midway through his concurrent chemoradiation.  Scans post chemoradiation showed partial response and patient will be started on maintenance durvalumab on 01/14/2020.  Patient was on maintenance durvalumab and received 15 cycles up until August 2022.  He was noted to have worsening chest pain and was in the ER multiple times in September 2022.  Repeat CT scan showed concern for hilar recurrence.  Patient received palliative radiation to the growing left upper lobe lung massWas given carbo Alimta for 4 cycles.  He was on maintenance Alimta which was stopped in December 2023 due to worsening anemia and AKI  Interval history-reports mild worsening exertional shortness of breath over the last few weeks.  Reports ongoing fatigue.  He has lost 15 pounds as compared to last visit in July  ECOG PS- 1 Pain scale- 0   Review of systems- Review of Systems  Constitutional:  Positive for malaise/fatigue and weight loss. Negative for chills and fever.  HENT:  Negative for congestion, ear discharge and nosebleeds.   Eyes:  Negative for blurred vision.  Respiratory:  Positive for shortness of breath. Negative for cough, hemoptysis, sputum production and wheezing.   Cardiovascular:  Negative for chest pain, palpitations, orthopnea and claudication.  Gastrointestinal:  Negative for abdominal pain, blood in stool, constipation, diarrhea, heartburn, melena, nausea and vomiting.  Genitourinary:  Negative for dysuria, flank pain, frequency, hematuria and urgency.  Musculoskeletal:  Negative for back pain, joint pain and myalgias.  Skin:  Negative for rash.  Neurological:  Negative for dizziness, tingling, focal weakness, seizures, weakness and headaches.  Endo/Heme/Allergies:   Does not bruise/bleed easily.  Psychiatric/Behavioral:  Negative for depression and suicidal ideas. The patient does not have insomnia.       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 (HCC)      Past Surgical History:  Procedure Laterality Date   ESOPHAGOGASTRODUODENOSCOPY (EGD) WITH PROPOFOL N/A 12/01/2020   Procedure: ESOPHAGOGASTRODUODENOSCOPY (EGD) WITH PROPOFOL;  Surgeon: Toney Reil, MD;  Location: [facility] ENDOSCOPY;  Service: Gastroenterology;  Laterality: N/A;   PORTA CATH INSERTION N/A 10/22/2019   Procedure: PORTA CATH INSERTION;  Surgeon: Renford Dills, MD;  Location: [facility] INVASIVE CV LAB;  Service: Cardiovascular;  Laterality: N/A;   VIDEO BRONCHOSCOPY WITH ENDOBRONCHIAL ULTRASOUND N/A 09/25/2019   Procedure: VIDEO BRONCHOSCOPY WITH ENDOBRONCHIAL ULTRASOUND;  Surgeon: Salena Saner, MD;  Location: [facility] ORS;  Service: Pulmonary;  Laterality: N/A;    Social History   Socioeconomic History   Marital status: Single    Spouse name: Not on file   Number of children: Not on file   Years of education: Not on file   Highest education level: Not on file  Occupational History   Not on file  Tobacco Use   Smoking status: Former    Current packs/day: 0.00    Average packs/day: 1 pack/day for 20.0 years (20.0 ttl pk-yrs)    Types: Cigarettes    Start date: 63    Quit date: 58    Years since quitting: 30.8   Smokeless tobacco: Never  Vaping Use   Vaping status: Never Used  Substance and Sexual Activity   Alcohol use: Not Currently    Comment: not drank any beer in 2 onths   Drug use: Never   Sexual activity: Not Currently  Other Topics Concern   Not on file  Social History Narrative   Not on file   Social Determinants of Health    Financial Resource Strain: Not on file  Food Insecurity: Not on file  Transportation Needs: Unmet Transportation Needs (08/05/2022)   PRAPARE - Administrator, Civil Service (Medical): Yes    Lack of Transportation (Non-Medical): Yes  Physical Activity: Not on file  Stress: Not on file  Social Connections: Not on file  Intimate Partner Violence: Not on file    Family History  Problem Relation Age of Onset   Cancer Brother      Current Outpatient Medications:    Budeson-Glycopyrrol-Formoterol (BREZTRI AEROSPHERE) 160-9-4.8 MCG/ACT AERO, Inhale 2 puffs into the lungs in the morning and at bedtime., Disp: 10.7 g, Rfl: 5   FLOVENT HFA 220 MCG/ACT inhaler, Inhale 2 puffs into the lungs 2 (two) times daily., Disp: , Rfl:    acetaminophen (TYLENOL) 500 MG tablet, Take 1,000 mg by mouth every 6 (six) hours as needed for mild pain, moderate pain or headache. (Patient not taking: Reported on 08/05/2022), Disp: , Rfl:    albuterol (VENTOLIN HFA) 108 (90 Base) MCG/ACT inhaler, Inhale 2 puffs into the lungs every 4 (four) hours as needed for wheezing or shortness of breath. (Patient not taking: Reported on 08/05/2022),  Disp: 8 g, Rfl: 1   ANORO ELLIPTA 62.5-25 MCG/ACT AEPB, INHALE 1 PUFF INTO THE LUNGS DAILY (Patient not taking: Reported on 08/05/2022), Disp: 60 each, Rfl: 2   folic acid (FOLVITE) 1 MG tablet, Take 1 tablet (1 mg total) by mouth daily. (Patient not taking: Reported on 01/14/2022), Disp: 30 tablet, Rfl: 3   lidocaine (LIDODERM) 5 %, Place 1 patch onto the skin daily. Remove & Discard patch within 12 hours or as directed by MD (Patient not taking: Reported on 05/07/2021), Disp: 30 patch, Rfl: 0   lidocaine-prilocaine (EMLA) cream, Apply 1 application topically as needed. (Patient not taking: Reported on 08/05/2022), Disp: 30 g, Rfl: 3   meclizine (ANTIVERT) 25 MG tablet, Take 1 tablet (25 mg total) by mouth 3 (three) times daily as needed for dizziness. (Patient not taking:  Reported on 12/05/2022), Disp: 20 tablet, Rfl: 0   naloxegol oxalate (MOVANTIK) 25 MG TABS tablet, Take 1 tablet (25 mg total) by mouth daily. (Patient not taking: Reported on 05/07/2021), Disp: 30 tablet, Rfl: 2   potassium chloride SA (KLOR-CON M) 20 MEQ tablet, TAKE 1 TABLET(20 MEQ) BY MOUTH TWICE DAILY (Patient not taking: Reported on 08/05/2022), Disp: 20 tablet, Rfl: 0  Physical exam:  Vitals:   12/05/22 1007 12/05/22 1013  BP: (!) 135/106 114/77  Pulse: 93   Resp: 17   Temp: (!) 97.3 F (36.3 C)   TempSrc: Tympanic   SpO2: 100%   Weight: 161 lb (73 kg)    Physical Exam Cardiovascular:     Rate and Rhythm: Normal rate and regular rhythm.     Heart sounds: Normal heart sounds.  Pulmonary:     Effort: Pulmonary effort is normal.     Comments: Breath sounds decreased over left lung base Abdominal:     General: Bowel sounds are normal.     Palpations: Abdomen is soft.  Skin:    General: Skin is warm and dry.  Neurological:     Mental Status: He is alert and oriented to person, place, and time.         Latest Ref Rng & Units 12/05/2022    9:45 AM  CMP  Glucose 70 - 99 mg/dL 841   BUN 8 - 23 mg/dL 14   Creatinine 3.24 - 1.24 mg/dL 4.01   Sodium 027 - 253 mmol/L 136   Potassium 3.5 - 5.1 mmol/L 3.7   Chloride 98 - 111 mmol/L 104   CO2 22 - 32 mmol/L 23   Calcium 8.9 - 10.3 mg/dL 8.9   Total Protein 6.5 - 8.1 g/dL 8.1   Total Bilirubin 0.3 - 1.2 mg/dL 0.7   Alkaline Phos 38 - 126 U/L 60   AST 15 - 41 U/L 15   ALT 0 - 44 U/L 9       Latest Ref Rng & Units 12/05/2022    9:45 AM  CBC  WBC 4.0 - 10.5 K/uL 7.0   Hemoglobin 13.0 - 17.0 g/dL 8.8   Hematocrit 66.4 - 52.0 % 30.4   Platelets 150 - 400 K/uL 484     No images are attached to the encounter.  CT CHEST ABDOMEN PELVIS W CONTRAST  Result Date: 12/03/2022 CLINICAL DATA:  72 year old with malignant neoplasm lower lobe of left lung. Metastatic disease evaluation. EXAM: CT CHEST, ABDOMEN, AND PELVIS WITH  CONTRAST TECHNIQUE: Multidetector CT imaging of the chest, abdomen and pelvis was performed following the standard protocol during bolus administration of intravenous contrast. RADIATION DOSE REDUCTION:  This exam was performed according to the departmental dose-optimization program which includes automated exposure control, adjustment of the mA and/or kV according to patient size and/or use of iterative reconstruction technique. CONTRAST:  OMNIPAQUE IOHEXOL 300 MG/ML  SOLN COMPARISON:  CT chest, abdomen, pelvis 07/15/2022 FINDINGS: CT CHEST FINDINGS Cardiovascular: Right jugular Port-A-Cath the tip near the superior cavoatrial junction. Heart size appears to be slightly smaller but could be related to the orientation due to the left pleural fluid. Small amount of pericardial fluid. Mediastinum/Nodes: Visualized thyroid tissue is are is unremarkable. No significant mediastinal lymph node enlargement. Limited evaluation of the left hilum due to extensive volume loss in the left lung. No significant axillary lymph node enlargement. Lungs/Pleura: Left pleural effusion is significantly enlarged. Left pleural effusion is now large for size. Vague hyperdense areas within the left lung raise concern for loculations. Complete collapse of the left lower lobe with air bronchograms. There is increased volume loss in the left upper lobe. Concern for a focal nodular lesion surrounded by pleural fluid and collapsed lung in the left upper lobe on image 31/2. This focal nodular area roughly measures 1.6 x 1.2 cm. Increased pleural thickening or fluid along the right major fissure on image 50/4. Nodule along the right minor fissure on image 80/4 measures roughly 3 mm and slightly more conspicuous than the previous examination. Overall, there is no significant nodularity in the right lung. Small reticular peripheral densities in the right lung are nonspecific but could be associated with atelectasis. Musculoskeletal: No acute  bone abnormality. CT ABDOMEN PELVIS FINDINGS Hepatobiliary: Normal appearance of the gallbladder. No suspicious hepatic lesions. No biliary dilatation. Portal venous system is patent. Pancreas: Again noted is a low-density structure in the distal pancreatic body region that measures 1.2 x 1.0 cm and minimally changed. A subtle 0.7 cm low-density structure near the pancreatic neck has minimally changed. There may be focal dilatation of the extrahepatic bile duct on image 64 which is similar to the previous examination. No acute inflammatory changes around the pancreas. Spleen: Normal in size without focal abnormality. Adrenals/Urinary Tract: Adrenal glands are within normal limits. No suspicious renal lesions. No hydronephrosis. Mild bladder wall thickening but the bladder is also decompressed. Bladder findings are nonspecific. Stomach/Bowel: Stomach is within normal limits. No evidence for bowel dilatation or obstruction. No focal bowel inflammation. Vascular/Lymphatic: Aortic atherosclerosis. No enlarged abdominal or pelvic lymph nodes. Reproductive: Prostate is enlarged with low-density in the left paracentral region. These findings are similar to the previous examination. Other: Small amount of free fluid in the pelvis is new. Negative for free air. Again noted are small right periumbilical hernias containing fat. Musculoskeletal: No acute bone abnormality. IMPRESSION: 1. Increased volume loss in the left lung and the left pleural effusion has significantly enlarged in size. The left pleural effusion is large for size and likely complex with loculations. In addition, there is concern for a focal nodular area involving collapsed lung which is indeterminate but cannot exclude neoplastic disease. This area could be further characterized with PET-CT and left pleural fluid could be evaluated with ultrasound and/or thoracentesis. 2. No evidence for metastatic disease in the abdomen or pelvis. 3. Stable low-density or  cystic areas involving the pancreas. Recommend attention to these areas on follow-up. 4. Small amount of fluid in the pelvis is new. 5. These results will be called to the ordering clinician or representative by the Radiologist Assistant, and communication documented in the PACS or [employer]. Electronically Signed   By: Madelaine Bhat  Lowella Dandy M.D.   On: 12/03/2022 09:56     Assessment and plan- Patient is a 72 y.o. male with history of recurrent [name] adenocarcinoma of the lung cT2 N3 M0 here to discuss CT scan results And further management  I have reviewed CT chest abdomen and pelvis images from 11/11/2042 independently and discussed findings with the patient which shows increased volume loss in the left lung as well as large left pleural effusion which was noted to be complex with loculations.  Locally recurrent disease was not excluded.  There was no evidence of distant metastatic disease disease or local regional adenopathy.  I will plan to obtain PET scan to further characterize these findings and need for repeat biopsy.  I am also getting thoracentesis and fluid sent out for cytology this week.  Microcytic anemia: Patient's hemoglobin has decreased from 12.6-8.8 presently with evidence of microcytosis.  Ferritin levels are elevated at 408 which can be an acute phase reactant.  Iron saturation is low at 9%.  Folate levels are low as well.  I will proceed with IV iron at this time.  Follow-up with me to be decided based on PET scan and thoracentesis findings   Visit Diagnosis 1. 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)   2. Pleural effusion   3. Goals of care, counseling/discussion   4. Microcytic anemia      Dr. Owens Shark, MD, MPH [facility] [phone] 12/05/2022 3:57 PM

## 2022-12-05 NOTE — Telephone Encounter (Signed)
Patient not available, sister answered the phone and she does not think he is taking folic acid. Informed her patient's level is low and take preferred dose of folic acid requested by Dr. Smith Robert. She stated she will inform patient

## 2022-12-05 NOTE — Telephone Encounter (Signed)
-----   Message from Creig Hines sent at 12/05/2022  2:29 PM EDT ----- Please make sure he is taking folic acid 1 mg po daily

## 2022-12-06 ENCOUNTER — Other Ambulatory Visit: Payer: Self-pay | Admitting: Oncology

## 2022-12-07 ENCOUNTER — Ambulatory Visit
Admission: RE | Admit: 2022-12-07 | Discharge: 2022-12-07 | Disposition: A | Discharge status: Home / Self Care | Payer: 59 | Source: Ambulatory Visit | Attending: Oncology | Admitting: Oncology

## 2022-12-07 ENCOUNTER — Other Ambulatory Visit: Payer: Self-pay | Admitting: Oncology

## 2022-12-07 ENCOUNTER — Telehealth: Payer: Self-pay | Admitting: *Deleted

## 2022-12-07 DIAGNOSIS — J90 Pleural effusion, not elsewhere classified: Secondary | ICD-10-CM | POA: Insufficient documentation

## 2022-12-07 DIAGNOSIS — C3432 Malignant neoplasm of lower lobe, left bronchus or lung: Secondary | ICD-10-CM | POA: Insufficient documentation

## 2022-12-07 DIAGNOSIS — D509 Iron deficiency anemia, unspecified: Secondary | ICD-10-CM

## 2022-12-07 DIAGNOSIS — Z7189 Other specified counseling: Secondary | ICD-10-CM

## 2022-12-07 NOTE — Progress Notes (Signed)
Patient presented to [facility] [place] department for requested thoracentesis. Initial [place] scanning showed a loculated left pleural effusion. A discussion was then held with the patient regarding the risks and benefits of thoracentesis. Following this discussion, patient felt uncomfortable proceeding with thoracentesis and wished to defer thoracentesis at this time. Thoracentesis was not attempted today.  Kennieth Francois, PA-C 12/07/2022 9:13 AM

## 2022-12-07 NOTE — Telephone Encounter (Signed)
Per Dr. Smith Robert, thoracentesis unable to be performed today due to risks. Pt needs to be referred to CT surgery for drainage/possible pleurodesis of left pleural effusion. Pt's sister made aware and urgent referral has been placed.

## 2022-12-12 ENCOUNTER — Ambulatory Visit
Admission: RE | Admit: 2022-12-12 | Discharge: 2022-12-12 | Disposition: A | Discharge status: Home / Self Care | Payer: 59 | Source: Ambulatory Visit | Attending: Oncology | Admitting: Oncology

## 2022-12-12 DIAGNOSIS — I700 Atherosclerosis of aorta: Secondary | ICD-10-CM | POA: Insufficient documentation

## 2022-12-12 DIAGNOSIS — C3432 Malignant neoplasm of lower lobe, left bronchus or lung: Secondary | ICD-10-CM | POA: Diagnosis not present

## 2022-12-12 DIAGNOSIS — M47814 Spondylosis without myelopathy or radiculopathy, thoracic region: Secondary | ICD-10-CM | POA: Insufficient documentation

## 2022-12-12 DIAGNOSIS — J439 Emphysema, unspecified: Secondary | ICD-10-CM | POA: Insufficient documentation

## 2022-12-12 DIAGNOSIS — J9819 Other pulmonary collapse: Secondary | ICD-10-CM | POA: Diagnosis not present

## 2022-12-12 DIAGNOSIS — J90 Pleural effusion, not elsewhere classified: Secondary | ICD-10-CM | POA: Insufficient documentation

## 2022-12-12 LAB — GLUCOSE, CAPILLARY: Glucose-Capillary: 80 mg/dL (ref 70–99)

## 2022-12-12 MED ORDER — FLUDEOXYGLUCOSE F - 18 (FDG) INJECTION
8.3000 | Freq: Once | INTRAVENOUS | Status: AC | PRN
  Administered 2022-12-12: 8.94 via INTRAVENOUS

## 2022-12-14 ENCOUNTER — Inpatient Hospital Stay: Payer: 59

## 2022-12-14 VITALS — BP 119/76 | HR 73 | Temp 97.7°F | Resp 18

## 2022-12-14 DIAGNOSIS — D509 Iron deficiency anemia, unspecified: Secondary | ICD-10-CM | POA: Diagnosis not present

## 2022-12-14 DIAGNOSIS — E538 Deficiency of other specified B group vitamins: Secondary | ICD-10-CM

## 2022-12-14 DIAGNOSIS — Z23 Encounter for immunization: Secondary | ICD-10-CM

## 2022-12-14 DIAGNOSIS — D649 Anemia, unspecified: Secondary | ICD-10-CM

## 2022-12-14 MED ORDER — INFLUENZA VAC A&B SURF ANT ADJ 0.5 ML IM SUSY
0.5000 mL | PREFILLED_SYRINGE | Freq: Once | INTRAMUSCULAR | Status: AC
  Administered 2022-12-14: 0.5 mL via INTRAMUSCULAR
  Filled 2022-12-14: qty 0.5

## 2022-12-14 MED ORDER — SODIUM CHLORIDE 0.9% FLUSH
10.0000 mL | Freq: Once | INTRAVENOUS | Status: AC | PRN
  Administered 2022-12-14: 10 mL
  Filled 2022-12-14: qty 10

## 2022-12-14 MED ORDER — HEPARIN SOD (PORK) LOCK FLUSH 100 UNIT/ML IV SOLN
500.0000 [IU] | Freq: Once | INTRAVENOUS | Status: AC | PRN
Start: 2022-12-14 — End: 2022-12-14
  Administered 2022-12-14: 500 [IU]
  Filled 2022-12-14: qty 5

## 2022-12-14 MED ORDER — SODIUM CHLORIDE 0.9 % IV SOLN
INTRAVENOUS | Status: DC
Start: 2022-12-14 — End: 2022-12-14
  Filled 2022-12-14: qty 250

## 2022-12-14 MED ORDER — SODIUM CHLORIDE 0.9 % IV SOLN
510.0000 mg | INTRAVENOUS | Status: DC
  Administered 2022-12-14: 510 mg via INTRAVENOUS
  Filled 2022-12-14: qty 510

## 2022-12-21 ENCOUNTER — Inpatient Hospital Stay: Payer: 59 | Attending: Radiation Oncology

## 2022-12-21 VITALS — BP 122/70 | HR 75 | Temp 98.3°F | Resp 18

## 2022-12-21 DIAGNOSIS — Z923 Personal history of irradiation: Secondary | ICD-10-CM | POA: Insufficient documentation

## 2022-12-21 DIAGNOSIS — Z87891 Personal history of nicotine dependence: Secondary | ICD-10-CM | POA: Diagnosis not present

## 2022-12-21 DIAGNOSIS — I708 Atherosclerosis of other arteries: Secondary | ICD-10-CM | POA: Insufficient documentation

## 2022-12-21 DIAGNOSIS — M47814 Spondylosis without myelopathy or radiculopathy, thoracic region: Secondary | ICD-10-CM | POA: Diagnosis not present

## 2022-12-21 DIAGNOSIS — Z7951 Long term (current) use of inhaled steroids: Secondary | ICD-10-CM | POA: Insufficient documentation

## 2022-12-21 DIAGNOSIS — C3432 Malignant neoplasm of lower lobe, left bronchus or lung: Secondary | ICD-10-CM | POA: Insufficient documentation

## 2022-12-21 DIAGNOSIS — N400 Enlarged prostate without lower urinary tract symptoms: Secondary | ICD-10-CM | POA: Diagnosis not present

## 2022-12-21 DIAGNOSIS — E538 Deficiency of other specified B group vitamins: Secondary | ICD-10-CM | POA: Insufficient documentation

## 2022-12-21 DIAGNOSIS — D509 Iron deficiency anemia, unspecified: Secondary | ICD-10-CM | POA: Diagnosis present

## 2022-12-21 DIAGNOSIS — Z79899 Other long term (current) drug therapy: Secondary | ICD-10-CM | POA: Insufficient documentation

## 2022-12-21 DIAGNOSIS — J432 Centrilobular emphysema: Secondary | ICD-10-CM | POA: Insufficient documentation

## 2022-12-21 DIAGNOSIS — I10 Essential (primary) hypertension: Secondary | ICD-10-CM | POA: Insufficient documentation

## 2022-12-21 DIAGNOSIS — I700 Atherosclerosis of aorta: Secondary | ICD-10-CM | POA: Diagnosis not present

## 2022-12-21 DIAGNOSIS — E785 Hyperlipidemia, unspecified: Secondary | ICD-10-CM | POA: Insufficient documentation

## 2022-12-21 MED ORDER — SODIUM CHLORIDE 0.9 % IV SOLN
510.0000 mg | INTRAVENOUS | Status: DC
  Administered 2022-12-21: 510 mg via INTRAVENOUS
  Filled 2022-12-21: qty 510

## 2022-12-21 MED ORDER — HEPARIN SOD (PORK) LOCK FLUSH 100 UNIT/ML IV SOLN
500.0000 [IU] | Freq: Once | INTRAVENOUS | Status: AC | PRN
Start: 2022-12-21 — End: 2022-12-21
  Administered 2022-12-21: 500 [IU]
  Filled 2022-12-21: qty 5

## 2022-12-21 MED ORDER — SODIUM CHLORIDE 0.9 % IV SOLN
Freq: Once | INTRAVENOUS | Status: AC
  Filled 2022-12-21: qty 250

## 2022-12-22 ENCOUNTER — Institutional Professional Consult (permissible substitution): Payer: 59 | Admitting: Thoracic Surgery (Cardiothoracic Vascular Surgery)

## 2022-12-22 ENCOUNTER — Encounter: Payer: Self-pay | Admitting: Thoracic Surgery (Cardiothoracic Vascular Surgery)

## 2022-12-22 VITALS — BP 124/79 | HR 91 | Resp 20 | Ht 70.0 in | Wt 163.0 lb

## 2022-12-22 DIAGNOSIS — C3432 Malignant neoplasm of lower lobe, left bronchus or lung: Secondary | ICD-10-CM

## 2022-12-22 NOTE — Progress Notes (Signed)
PCP is Center, [facility] Referring Provider is Creig Hines, MD  Chief Complaint  Patient presents with  . Pleural Effusion  . Lung Cancer    New patient consultation PET 10/28, CAP CT 9/27    HPI: Mitchell Frye is sent for consultation regarding a left pleural effusion.  Mitchell Frye is a 72 year old man with a past history significant for lung cancer, hyperlipidemia, hypertension, and chronic dyspnea.  Diagnosed with stage IIIb adenocarcinoma of the lung.  Treated with concurrent chemoradiation.  Then started on durvalumab in November 2021.  That was stopped due to evidence of recurrence.  Treated with additional palliative radiation and carboplatin and Alimta.  That was stopped in December 2023.  Recently had a follow-up with Dr. Smith Robert.  Complained of 15 pound weight loss and shortness of breath.  Had a CT which showed a large left pleural effusion.  Suggestion of loculation.  Was sent for thoracentesis but refused because of the risk.  He says he thinks the fluid is gone.  Says his breathing is much better.  He can walk 2 or 3 blocks without stopping.  His sister notes that he does get short of breath with exertion.  He says his weight has been stable over the past month.  Zubrod Score: At the time of surgery this patient's most appropriate activity status/level should be described as: []     0    Normal activity, no symptoms []     1    Restricted in physical strenuous activity but ambulatory, able to do out light work [x]     2    Ambulatory and capable of self care, unable to do work activities, up and about >50 % of waking hours                              []     3    Only limited self care, in bed greater than 50% of waking hours []     4    Completely disabled, no self care, confined to bed or chair []     5    Moribund   Past Medical History:  Diagnosis Date  . Dyspnea   . Hyperlipidemia   . Hypertension   . Primary malignant neoplasm of left lower lobe of lung  [facility])     Past Surgical History:  Procedure Laterality Date  . ESOPHAGOGASTRODUODENOSCOPY (EGD) WITH PROPOFOL N/A 12/01/2020   Procedure: ESOPHAGOGASTRODUODENOSCOPY (EGD) WITH PROPOFOL;  Surgeon: Toney Reil, MD;  Location: [facility] ENDOSCOPY;  Service: Gastroenterology;  Laterality: N/A;  . PORTA CATH INSERTION N/A 10/22/2019   Procedure: PORTA CATH INSERTION;  Surgeon: Renford Dills, MD;  Location: [facility] INVASIVE CV LAB;  Service: Cardiovascular;  Laterality: N/A;  . VIDEO BRONCHOSCOPY WITH ENDOBRONCHIAL ULTRASOUND N/A 09/25/2019   Procedure: VIDEO BRONCHOSCOPY WITH ENDOBRONCHIAL ULTRASOUND;  Surgeon: Salena Saner, MD;  Location: [facility] ORS;  Service: Pulmonary;  Laterality: N/A;    Family History  Problem Relation Age of Onset  . Cancer Brother     Social History Social History   Tobacco Use  . Smoking status: Former    Current packs/day: 0.00    Average packs/day: 1 pack/day for 20.0 years (20.0 ttl pk-yrs)    Types: Cigarettes    Start date: 64    Quit date: 22    Years since quitting: 30.8  . Smokeless tobacco: Never  Vaping Use  . Vaping status: Never Used  Substance Use Topics  . Alcohol use: Not Currently    Comment: not drank any beer in 2 onths  . Drug use: Never    Current Outpatient Medications  Medication Sig Dispense Refill  . acetaminophen (TYLENOL) 500 MG tablet Take 1,000 mg by mouth every 6 (six) hours as needed for mild pain (pain score 1-3), moderate pain (pain score 4-6) or headache.    . albuterol (VENTOLIN HFA) 108 (90 Base) MCG/ACT inhaler Inhale 2 puffs into the lungs every 4 (four) hours as needed for wheezing or shortness of breath. 8 g 1  . ANORO ELLIPTA 62.5-25 MCG/ACT AEPB INHALE 1 PUFF INTO THE LUNGS DAILY 60 each 2  . Budeson-Glycopyrrol-Formoterol (BREZTRI AEROSPHERE) 160-9-4.8 MCG/ACT AERO Inhale 2 puffs into the lungs in the morning and at bedtime. 10.7 g 5  . FLOVENT HFA 220 MCG/ACT inhaler Inhale 2 puffs into the lungs 2  (two) times daily.    . folic acid (FOLVITE) 1 MG tablet Take 1 tablet (1 mg total) by mouth daily. 30 tablet 3  . lidocaine (LIDODERM) 5 % Place 1 patch onto the skin daily. Remove & Discard patch within 12 hours or as directed by MD 30 patch 0  . lidocaine-prilocaine (EMLA) cream Apply 1 application topically as needed. 30 g 3  . meclizine (ANTIVERT) 25 MG tablet Take 1 tablet (25 mg total) by mouth 3 (three) times daily as needed for dizziness. 20 tablet 0  . naloxegol oxalate (MOVANTIK) 25 MG TABS tablet Take 1 tablet (25 mg total) by mouth daily. 30 tablet 2  . potassium chloride SA (KLOR-CON M) 20 MEQ tablet TAKE 1 TABLET(20 MEQ) BY MOUTH TWICE DAILY 20 tablet 0   No current facility-administered medications for this visit.    Allergies  Allergen Reactions  . Taxol [Paclitaxel] Other (See Comments)    Chest pain, hip pain, coughing , wheezing    Review of Systems  Constitutional:  Positive for activity change and unexpected weight change.  HENT:  Positive for hearing loss.   Respiratory:  Positive for shortness of breath.   Gastrointestinal:  Positive for abdominal pain (reflux).  Genitourinary:  Negative for dysuria.  Neurological:  Negative for syncope and weakness.    BP 124/79 (BP Location: Left Arm, Patient Position: Sitting, Cuff Size: Normal)   Pulse 91   Resp 20   Ht 5\' 10"  (1.778 m)   Wt 163 lb (73.9 kg)   SpO2 95% Comment: RA  BMI 23.39 kg/m  Physical Exam Vitals reviewed.  Constitutional:      General: He is not in acute distress.    Appearance: Normal appearance.  HENT:     Head: Normocephalic and atraumatic.  Eyes:     General: No scleral icterus.    Extraocular Movements: Extraocular movements intact.  Cardiovascular:     Rate and Rhythm: Normal rate and regular rhythm.     Heart sounds: Normal heart sounds.  Pulmonary:     Effort: Pulmonary effort is normal. No respiratory distress.     Breath sounds: No wheezing or rales.     Comments:  Diminished breath sounds on left Abdominal:     General: There is no distension.     Palpations: Abdomen is soft.  Musculoskeletal:        General: No swelling.  Neurological:     General: No focal deficit present.     Mental Status: He is alert and oriented to person, place, and time.     Cranial Nerves:  No cranial nerve deficit.     Motor: No weakness.    Diagnostic Tests: NUCLEAR MEDICINE PET SKULL BASE TO THIGH   TECHNIQUE: 8.94 mCi F-18 FDG was injected intravenously. Full-ring PET imaging was performed from the skull base to thigh after the radiotracer. CT data was obtained and used for attenuation correction and anatomic localization.   Fasting blood glucose: 80 mg/dl   COMPARISON:  CT of the chest, abdomen and pelvis 11/11/2022 and 07/15/2022. PET-CT 12/08/2020 and 09/16/2019.   FINDINGS: Mediastinal blood pool activity: SUV max 2.5   NECK:   No hypermetabolic cervical lymph nodes are identified. No suspicious activity identified within the pharyngeal mucosal space.   Incidental CT findings: none   CHEST:   The most recent PET-CT was incomplete, limiting comparison. There are residual small hypermetabolic mediastinal lymph nodes, including a subcarinal node measuring 8 mm short axis on image 51/6 with an SUV max of 4.9 (previously 8.5). There is a small right paratracheal node with an SUV max of 5.3. No significant residual or recurrent hypermetabolic activity within the left perihilar region. There are small hypermetabolic left axillary lymph nodes, largest measuring 6 mm short axis on image 42/6 (SUV max 3.9). There is a small hypermetabolic right middle lobe nodule measuring 4 mm on image 61/6 with an SUV max of 3.5. 5 mm perifissural nodule along the minor fissure demonstrates only low level metabolic activity with an SUV max of 1.3. No suspicious metabolic activity within the left lung or left pleural space.   Incidental CT findings: Mild  centrilobular emphysema. Right IJ Port-A-Cath extends to the superior cavoatrial junction. Unchanged large left pleural effusion with complete left lower lobe collapse.   ABDOMEN/PELVIS:   There is no hypermetabolic activity within the liver, adrenal glands, spleen or pancreas. There is no hypermetabolic nodal activity in the abdomen or pelvis.   Incidental CT findings: Grossly stable low-density lesions within the pancreatic body and tail, without hypermetabolic activity or surrounding inflammation. Mild aortoiliac atherosclerosis and mild prostatomegaly.   SKELETON:   There is no hypermetabolic activity to suggest osseous metastatic disease.   Incidental CT findings: Probable radiation changes within the midthoracic spine. Mild thoracic spondylosis.   IMPRESSION: 1. No evidence of local recurrence in the left perihilar region or pleural space. 2. Small residual hypermetabolic mediastinal lymph nodes are improved from the previous PET-CT, although may reflect residual metastatic disease. No progressive adenopathy identified. 3. Small indeterminate hypermetabolic left axillary lymph node and right middle lobe nodule. Recommend attention on follow-up CT. 4. No evidence of metastatic disease in the abdomen or pelvis. 5. Unchanged large left pleural effusion with complete left lower lobe collapse. 6. Aortic Atherosclerosis (ICD10-I70.0) and Emphysema (ICD10-J43.9).     Electronically Signed   By: Carey Bullocks M.D.   On: 12/13/2022 14:59 I personally reviewed the CT and PET/CT images.  There is a large left pleural effusion.  Impression: Mitchell Frye is a 72 year old man with a past history significant for lung cancer, hyperlipidemia, hypertension, and chronic dyspnea.  Diagnosed with stage IIIb adenocarcinoma of the lung.  Has been treated with chemoradiation and immunotherapy.  Currently not on treatment.  Recently noted to have a large left pleural effusion.  Left  pleural effusion-concerning that this could be a malignant pleural effusion.  Also could be exudative parapneumonic effusion.  Does not have any clear history of pneumonia but did have a period of time when he had malaise and weight loss.  It would be helpful to know  if this is a malignant effusion or not.  He had been sent for thoracentesis but refused that because of the risk.  He was told his lung could "die out."  I explained to him that there is risk with any medical procedure but the risks with a thoracentesis particularly in his case given the size of the effusion were extremely small.  He is now agreeable to proceed with a thoracentesis.  We did briefly discuss proceeding directly for surgery.  I informed him that the risks of that are far greater than the risks of thoracentesis.  Major concern in his case is whether or not the lung will reexpand with surgical drainage of the fluid.  I discussed the general nature of the procedure including the incision to be used, the need for drainage tube postoperatively, the possible need for a semipermanent drainage tube, the expected hospital stay, and the overall recovery.  I informed him of the indications, risks, benefits, and alternatives.  He understands the risks include, but not limited to death, MI, DVT, PE, bleeding, possible need for transfusion, air leaks, failure to reexpand the lung, cardiac arrhythmias, as well as possibility of other unforeseeable complications.  He now wishes to try a thoracentesis first.  Plan: Thoracentesis -will try to drain as much fluid as possible. Will send for cytology, cell count, protein, glucose, and LDH.  I spent over 45 minutes today in review of records, images, and in consultation with Mr. Shellhammer. Loreli Slot, MD [facility] [phone]

## 2022-12-23 ENCOUNTER — Other Ambulatory Visit: Payer: Self-pay | Admitting: Thoracic Surgery (Cardiothoracic Vascular Surgery)

## 2022-12-23 DIAGNOSIS — C3432 Malignant neoplasm of lower lobe, left bronchus or lung: Secondary | ICD-10-CM

## 2022-12-26 ENCOUNTER — Ambulatory Visit: Payer: 59 | Admitting: Oncology

## 2022-12-26 ENCOUNTER — Ambulatory Visit (HOSPITAL_COMMUNITY)
Admission: RE | Admit: 2022-12-26 | Discharge: 2022-12-26 | Disposition: A | Discharge status: Home / Self Care | Payer: 59 | Source: Ambulatory Visit | Attending: Thoracic Surgery (Cardiothoracic Vascular Surgery) | Admitting: Thoracic Surgery (Cardiothoracic Vascular Surgery)

## 2022-12-26 ENCOUNTER — Ambulatory Visit (HOSPITAL_COMMUNITY)
Admission: RE | Admit: 2022-12-26 | Discharge: 2022-12-26 | Disposition: A | Discharge status: Home / Self Care | Payer: 59 | Source: Ambulatory Visit | Attending: Radiology | Admitting: Radiology

## 2022-12-26 DIAGNOSIS — J90 Pleural effusion, not elsewhere classified: Secondary | ICD-10-CM | POA: Insufficient documentation

## 2022-12-26 DIAGNOSIS — C3432 Malignant neoplasm of lower lobe, left bronchus or lung: Secondary | ICD-10-CM | POA: Insufficient documentation

## 2022-12-26 HISTORY — PX: IR THORACENTESIS ASP PLEURAL SPACE W/IMG GUIDE: IMG5380

## 2022-12-26 LAB — BODY FLUID CELL COUNT WITH DIFFERENTIAL: Total Nucleated Cell Count, Fluid: 12 uL (ref 0–1000)

## 2022-12-26 LAB — GLUCOSE, PLEURAL OR PERITONEAL FLUID: Glucose, Fluid: 34 mg/dL

## 2022-12-26 LAB — PROTEIN, PLEURAL OR PERITONEAL FLUID: Total protein, fluid: 3.4 g/dL

## 2022-12-26 MED ORDER — LIDOCAINE HCL 1 % IJ SOLN
INTRAMUSCULAR | Status: AC
  Filled 2022-12-26: qty 20

## 2022-12-26 MED ORDER — LIDOCAINE HCL 1 % IJ SOLN
10.0000 mL | Freq: Once | INTRAMUSCULAR | Status: AC
  Administered 2022-12-26: 10 mL via INTRADERMAL

## 2022-12-26 NOTE — Procedures (Signed)
PROCEDURE SUMMARY:   Large but severely loculated effusion on left. Successful US guided left thoracentesis. Yielded 500 mL of dark old bloody pleural fluid. Pt did experience left sided chest tightness during procedure, procedure stopped at this point. Concern for poor lung re-expansion/trapped lung No immediate complications.  Specimen was sent for labs. CXR ordered.  EBL < 5 mL  Brayton El PA-C 12/26/2022 9:30 AM

## 2022-12-27 LAB — CYTOLOGY - NON PAP

## 2022-12-28 ENCOUNTER — Encounter: Payer: 59 | Admitting: Thoracic Surgery (Cardiothoracic Vascular Surgery)

## 2022-12-30 ENCOUNTER — Inpatient Hospital Stay (HOSPITAL_BASED_OUTPATIENT_CLINIC_OR_DEPARTMENT_OTHER): Payer: 59 | Admitting: Oncology

## 2022-12-30 ENCOUNTER — Encounter: Payer: Self-pay | Admitting: Oncology

## 2022-12-30 VITALS — BP 125/80 | HR 99 | Temp 97.8°F | Resp 18 | Wt 162.3 lb

## 2022-12-30 DIAGNOSIS — D649 Anemia, unspecified: Secondary | ICD-10-CM | POA: Diagnosis not present

## 2022-12-30 DIAGNOSIS — D509 Iron deficiency anemia, unspecified: Secondary | ICD-10-CM | POA: Diagnosis not present

## 2022-12-30 DIAGNOSIS — C3432 Malignant neoplasm of lower lobe, left bronchus or lung: Secondary | ICD-10-CM | POA: Diagnosis not present

## 2022-12-30 DIAGNOSIS — E538 Deficiency of other specified B group vitamins: Secondary | ICD-10-CM

## 2022-12-30 NOTE — Progress Notes (Signed)
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  He underwent CT angios chest which did not show any PE.  Soft tissue attenuation measuring 2.9 x 4 cm centered in the left hilum resulting in abrupt angulation and narrowing of the pulmonary arteries and the central left lower lobe airways.  Additional ipsilateral hilar and subcarinal adenopathy.  No contralateral adenopathy.  Consolidative masslike opacity 3.6 x 3.1 cm in size and contiguous with more central perihilar soft tissue attenuation.  Overall findings concerning for primary bronchogenic carcinoma.    Patient lives with his sister who is his main caregiver.  He does not drive but is independent of his ADLs.  Reports ongoing fatigue and occasional retrosternal chest pain.  He has been evaluated by GI in the past for reflux as well.  Appetie is fair and weight is stable.  Denies any significant shortness of breath at this time.  Patient is an ex-smoker and smoked for about 20 years but quit smoking back in 1994   PET CT scan showed hypermetabolic mass in the left lower lobe with mediastinal adenopathy and right paratracheal adenopathy.  No evidence of distant metastatic disease.  Pathology was consistent with non-small cell lung cancer favor adenocarcinoma.  Cells  were positive for TTF-1 and negative for p40.   Patient started concurrent carbotaxol radiation and then had allergic reaction to Taxol for which she was switched to Abraxane.  Given the short supply of Abraxane patient received carbo Alimta midway through his concurrent chemoradiation.  Scans post chemoradiation showed partial response and patient will be started on maintenance durvalumab on 01/14/2020.  Patient was on maintenance durvalumab and received 15 cycles up until August 2022.  He was noted to have worsening chest pain and was in the ER multiple times in September 2022.  Repeat CT scan showed concern for hilar recurrence.  Patient received palliative radiation to the growing left upper lobe lung massWas given carbo Alimta for 4 cycles.  He was on maintenance Alimta which was stopped in December 2023 due to worsening anemia and AKI  Interval history-patient recently had left-sided thoraCentesis done and reports that his breathing is better since then.  Denies any cough shortness of breath or fever  ECOG PS- 1 Pain scale- 0   Review of systems- Review of Systems  Constitutional:  Positive for malaise/fatigue. Negative for chills, fever and weight loss.  HENT:  Negative for congestion, ear discharge and nosebleeds.   Eyes:  Negative for blurred vision.  Respiratory:  Negative for cough, hemoptysis, sputum production, shortness of breath and wheezing.   Cardiovascular:  Negative for chest pain, palpitations, orthopnea and claudication.  Gastrointestinal:  Negative for abdominal pain, blood in stool, constipation, diarrhea, heartburn, melena, nausea and vomiting.  Genitourinary:  Negative for dysuria, flank pain, frequency, hematuria and urgency.  Musculoskeletal:  Negative for back pain, joint pain and myalgias.  Skin:  Negative for rash.  Neurological:  Negative for dizziness, tingling, focal weakness, seizures, weakness and headaches.  Endo/Heme/Allergies:  Does not bruise/bleed easily.   Psychiatric/Behavioral:  Negative for depression and suicidal ideas. The patient does not have insomnia.       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 (HCC)      Past Surgical History:  Procedure Laterality Date   ESOPHAGOGASTRODUODENOSCOPY (EGD) WITH PROPOFOL N/A 12/01/2020   Procedure: ESOPHAGOGASTRODUODENOSCOPY (EGD) WITH PROPOFOL;  Surgeon: Toney Reil, MD;  Location: [facility] ENDOSCOPY;  Service: Gastroenterology;  Laterality: N/A;   IR THORACENTESIS ASP PLEURAL SPACE W/IMG GUIDE  12/26/2022   PORTA CATH INSERTION N/A 10/22/2019   Procedure: PORTA CATH INSERTION;  Surgeon: Renford Dills, MD;  Location: [facility] INVASIVE CV LAB;  Service: Cardiovascular;  Laterality: N/A;   VIDEO BRONCHOSCOPY WITH ENDOBRONCHIAL ULTRASOUND N/A 09/25/2019   Procedure: VIDEO BRONCHOSCOPY WITH ENDOBRONCHIAL ULTRASOUND;  Surgeon: Salena Saner, MD;  Location: [facility] ORS;  Service: Pulmonary;  Laterality: N/A;    Social History   Socioeconomic History   Marital status: Single    Spouse name: Not on file   Number of children: Not on file   Years of education: Not on file   Highest education level: Not on file  Occupational History   Not on file  Tobacco Use   Smoking status: Former    Current packs/day: 0.00    Average packs/day: 1 pack/day for 20.0 years (20.0 ttl pk-yrs)    Types: Cigarettes    Start date: 71    Quit date: 25    Years since quitting: 30.8   Smokeless tobacco: Never  Vaping Use   Vaping status: Never Used  Substance and Sexual Activity   Alcohol use: Not Currently    Comment: not drank any beer in 2 onths   Drug use: Never   Sexual activity: Not Currently  Other Topics Concern   Not on file  Social History Narrative   Not on file    Social Determinants of Health   Financial Resource Strain: Not on file  Food Insecurity: Not on file  Transportation Needs: Unmet Transportation Needs (08/05/2022)   PRAPARE - Administrator, Civil Service (Medical): Yes    Lack of Transportation (Non-Medical): Yes  Physical Activity: Not on file  Stress: Not on file  Social Connections: Not on file  Intimate Partner Violence: Not on file    Family History  Problem Relation Age of Onset   Cancer Brother      Current Outpatient Medications:    albuterol (VENTOLIN HFA) 108 (90 Base) MCG/ACT inhaler, Inhale 2 puffs into the lungs every 4 (four) hours as needed for wheezing or shortness of breath., Disp: 8 g, Rfl: 1   Budeson-Glycopyrrol-Formoterol (BREZTRI AEROSPHERE) 160-9-4.8 MCG/ACT AERO, Inhale 2 puffs into the lungs in the morning and at bedtime., Disp: 10.7 g, Rfl: 5   FLOVENT HFA 220 MCG/ACT inhaler, Inhale 2 puffs into the lungs 2 (two) times daily., Disp: , Rfl:    acetaminophen (TYLENOL) 500 MG tablet, Take 1,000 mg by mouth every 6 (six) hours as needed for mild pain (pain score 1-3), moderate pain (pain score 4-6) or headache. (Patient not taking:  Reported on 12/30/2022), Disp: , Rfl:    ANORO ELLIPTA 62.5-25 MCG/ACT AEPB, INHALE 1 PUFF INTO THE LUNGS DAILY (Patient not taking: Reported on 12/30/2022), Disp: 60 each, Rfl: 2   folic acid (FOLVITE) 1 MG tablet, Take 1 tablet (1 mg total) by mouth daily. (Patient not taking: Reported on 12/30/2022), Disp: 30 tablet, Rfl: 3   lidocaine (LIDODERM) 5 %, Place 1 patch onto the skin daily. Remove & Discard patch within 12 hours or as directed by MD (Patient not taking: Reported on 12/30/2022), Disp: 30 patch, Rfl: 0   lidocaine-prilocaine (EMLA) cream, Apply 1 application topically as needed. (Patient not taking: Reported on 12/30/2022), Disp: 30 g, Rfl: 3   meclizine (ANTIVERT) 25 MG tablet, Take 1 tablet (25 mg total) by mouth 3 (three) times daily as needed for  dizziness. (Patient not taking: Reported on 12/30/2022), Disp: 20 tablet, Rfl: 0   naloxegol oxalate (MOVANTIK) 25 MG TABS tablet, Take 1 tablet (25 mg total) by mouth daily. (Patient not taking: Reported on 12/30/2022), Disp: 30 tablet, Rfl: 2   potassium chloride SA (KLOR-CON M) 20 MEQ tablet, TAKE 1 TABLET(20 MEQ) BY MOUTH TWICE DAILY (Patient not taking: Reported on 12/30/2022), Disp: 20 tablet, Rfl: 0  Physical exam:  Vitals:   12/30/22 1133  BP: 125/80  Pulse: 99  Resp: 18  Temp: 97.8 F (36.6 C)  TempSrc: Tympanic  SpO2: 100%  Weight: 162 lb 4.8 oz (73.6 kg)   Physical Exam Cardiovascular:     Rate and Rhythm: Normal rate and regular rhythm.     Heart sounds: Normal heart sounds.  Pulmonary:     Effort: Pulmonary effort is normal.     Comments: Breath sounds decreased over left lung base Abdominal:     General: Bowel sounds are normal.     Palpations: Abdomen is soft.  Skin:    General: Skin is warm and dry.  Neurological:     Mental Status: He is alert and oriented to person, place, and time.         Latest Ref Rng & Units 12/05/2022    9:45 AM  CMP  Glucose 70 - 99 mg/dL 846   BUN 8 - 23 mg/dL 14   Creatinine 9.62 - 1.24 mg/dL 9.52   Sodium 841 - 324 mmol/L 136   Potassium 3.5 - 5.1 mmol/L 3.7   Chloride 98 - 111 mmol/L 104   CO2 22 - 32 mmol/L 23   Calcium 8.9 - 10.3 mg/dL 8.9   Total Protein 6.5 - 8.1 g/dL 8.1   Total Bilirubin 0.3 - 1.2 mg/dL 0.7   Alkaline Phos 38 - 126 U/L 60   AST 15 - 41 U/L 15   ALT 0 - 44 U/L 9       Latest Ref Rng & Units 12/05/2022    9:45 AM  CBC  WBC 4.0 - 10.5 K/uL 7.0   Hemoglobin 13.0 - 17.0 g/dL 8.8   Hematocrit 40.1 - 52.0 % 30.4   Platelets 150 - 400 K/uL 484     No images are attached to the encounter.  IR THORACENTESIS ASP PLEURAL SPACE W/IMG GUIDE  Result Date: 12/26/2022 INDICATION: Shortness of breath. Large left pleural effusion. History of lung cancer. EXAM: ULTRASOUND GUIDED LEFT THORACENTESIS  MEDICATIONS: 1% plain lidocaine, 5 mL COMPLICATIONS: None immediate.  Chest x-ray shows no postprocedural pneumothorax. PROCEDURE: An ultrasound guided thoracentesis was thoroughly discussed with the patient and questions answered. The benefits, risks, alternatives and complications were  also discussed. The patient understands and wishes to proceed with the procedure. Written consent was obtained. Large but severely loculated left pleural effusion present. Ultrasound was performed to localize and mark an adequate pocket of fluid in the left chest. The area was then prepped and draped in the normal sterile fashion. 1% Lidocaine was used for local anesthesia. Under ultrasound guidance a 6 Fr Safe-T-Centesis catheter was introduced. Thoracentesis was performed. Patient experienced significant left-sided chest tightness and discomfort. Procedure terminated at this point. The catheter was removed and a dressing applied. FINDINGS: A total of approximately 500 mL of dark bloody pleural fluid was removed. Samples were sent to the laboratory as requested by the clinical team. Concern for trapped lung given loculated appearance and symptomatic chest tightness during the procedure. IMPRESSION: Successful ultrasound guided left thoracentesis yielding 500 mL of pleural fluid. Procedure performed by Brayton El PA-C and supervised by Dr. Marliss Coots Electronically Signed   By: Marliss Coots M.D.   On: 12/26/2022 10:56   DG Chest 1 View  Result Date: 12/26/2022 CLINICAL DATA:  72 year old male with lung cancer, pleural effusion, status post left side thoracentesis this morning. EXAM: CHEST  1 VIEW COMPARISON:  Chest CT 11/11/2022 and earlier. FINDINGS: Upright AP view 0844 hours. Ongoing moderate to large left pleural effusion with dense left lung base opacification and some hilar air bronchograms. No pneumothorax identified. Contralateral right chest power port appears stable. Some left lung volume loss with leftward mild  mediastinal shift. Right lung appears stable, negative. Visualized tracheal air column is within normal limits. Stable visualized osseous structures. Visible bowel gas pattern is stable, within normal limits. IMPRESSION: No pneumothorax following left side thoracentesis. Residual left pleural effusion and left lung volume loss. Electronically Signed   By: Odessa Fleming M.D.   On: 12/26/2022 08:55   NM PET Image Restag (PS) Skull Base To Thigh  Result Date: 12/13/2022 CLINICAL DATA:  Subsequent treatment strategy for non-small cell lung cancer. EXAM: NUCLEAR MEDICINE PET SKULL BASE TO THIGH TECHNIQUE: 8.94 mCi F-18 FDG was injected intravenously. Full-ring PET imaging was performed from the skull base to thigh after the radiotracer. CT data was obtained and used for attenuation correction and anatomic localization. Fasting blood glucose: 80 mg/dl COMPARISON:  CT of the chest, abdomen and pelvis 11/11/2022 and 07/15/2022. PET-CT 12/08/2020 and 09/16/2019. FINDINGS: Mediastinal blood pool activity: SUV max 2.5 NECK: No hypermetabolic cervical lymph nodes are identified. No suspicious activity identified within the pharyngeal mucosal space. Incidental CT findings: none CHEST: The most recent PET-CT was incomplete, limiting comparison. There are residual small hypermetabolic mediastinal lymph nodes, including a subcarinal node measuring 8 mm short axis on image 51/6 with an SUV max of 4.9 (previously 8.5). There is a small right paratracheal node with an SUV max of 5.3. No significant residual or recurrent hypermetabolic activity within the left perihilar region. There are small hypermetabolic left axillary lymph nodes, largest measuring 6 mm short axis on image 42/6 (SUV max 3.9). There is a small hypermetabolic right middle lobe nodule measuring 4 mm on image 61/6 with an SUV max of 3.5. 5 mm perifissural nodule along the minor fissure demonstrates only low level metabolic activity with an SUV max of 1.3. No suspicious  metabolic activity within the left lung or left pleural space. Incidental CT findings: Mild centrilobular emphysema. Right IJ Port-A-Cath extends to the superior cavoatrial junction. Unchanged large left pleural effusion with complete left lower lobe collapse. ABDOMEN/PELVIS: There is no hypermetabolic activity within the liver,  adrenal glands, spleen or pancreas. There is no hypermetabolic nodal activity in the abdomen or pelvis. Incidental CT findings: Grossly stable low-density lesions within the pancreatic body and tail, without hypermetabolic activity or surrounding inflammation. Mild aortoiliac atherosclerosis and mild prostatomegaly. SKELETON: There is no hypermetabolic activity to suggest osseous metastatic disease. Incidental CT findings: Probable radiation changes within the midthoracic spine. Mild thoracic spondylosis. IMPRESSION: 1. No evidence of local recurrence in the left perihilar region or pleural space. 2. Small residual hypermetabolic mediastinal lymph nodes are improved from the previous PET-CT, although may reflect residual metastatic disease. No progressive adenopathy identified. 3. Small indeterminate hypermetabolic left axillary lymph node and right middle lobe nodule. Recommend attention on follow-up CT. 4. No evidence of metastatic disease in the abdomen or pelvis. 5. Unchanged large left pleural effusion with complete left lower lobe collapse. 6. Aortic Atherosclerosis (ICD10-I70.0) and Emphysema (ICD10-J43.9). Electronically Signed   By: Carey Bullocks M.D.   On: 12/13/2022 14:59   [place] CHEST (PLEURAL EFFUSION)  Result Date: 12/07/2022 INDICATION: Malignant neoplasm of lower lobe of left lung with associated left pleural effusion. Request received for diagnostic thoracentesis. EXAM: CHEST ULTRASOUND COMPARISON:  None Available. FINDINGS: Large loculated left pleural effusion on ultrasound exam. IMPRESSION: Large loculated left pleural effusion visualized on ultrasound. A discussion  of requested thoracentesis was held with the patient, including risks and benefits of procedure. Following discussion of risks, patient declined requested thoracentesis at this time. Thoracentesis was not attempted today. Visit conducted by Mina Marble, PA-C Electronically Signed   By: Olive Bass M.D.   On: 12/07/2022 10:51     Assessment and plan- Patient is a 72 y.o. male with history of recurrent [name] adenocarcinoma of the lung cT2 N3 M0.  He is here for follow-up of following issues:   CT scans in September 2024 showed a large left pleural effusion with volume loss in left lung.  This was followed by a PET scan which did not show any Evidence of lung cancer recurrence elsewhere.  However malignant pleural effusion was not ruled out.  Patient was seen by thoracic surgery due to concern for loculated pleural effusion and ultimately decided to proceed with thoracentesis.  Thoracentesis cytology does not show any evidence of malignancy or infection.  He has another follow-up scheduled with Dr. Dorris Fetch in 10 days.  I will plan to see him back in 2 months with CT chest without contrast prior.  He is not receiving any active treatment for his lung cancer and we will continue to monitor him off therapy at this time  History of iron deficiency anemia: S/p 2 doses of Feraheme and I will repeat CBC ferritin and iron studies in 2 months as well    Visit Diagnosis 1. B12 deficiency   2. Normocytic anemia   3. 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)      Dr. Owens Shark, MD, MPH [facility] [phone] 12/30/2022 1:05 PM

## 2023-01-10 ENCOUNTER — Other Ambulatory Visit: Payer: Self-pay | Admitting: *Deleted

## 2023-01-10 ENCOUNTER — Ambulatory Visit (INDEPENDENT_AMBULATORY_CARE_PROVIDER_SITE_OTHER): Payer: 59 | Admitting: Thoracic Surgery (Cardiothoracic Vascular Surgery)

## 2023-01-10 ENCOUNTER — Encounter: Payer: Self-pay | Admitting: Thoracic Surgery (Cardiothoracic Vascular Surgery)

## 2023-01-10 ENCOUNTER — Other Ambulatory Visit: Payer: Self-pay | Admitting: Thoracic Surgery (Cardiothoracic Vascular Surgery)

## 2023-01-10 ENCOUNTER — Ambulatory Visit
Admission: RE | Admit: 2023-01-10 | Discharge: 2023-01-10 | Disposition: A | Discharge status: Home / Self Care | Payer: 59 | Source: Ambulatory Visit | Attending: Thoracic Surgery (Cardiothoracic Vascular Surgery) | Admitting: Thoracic Surgery (Cardiothoracic Vascular Surgery)

## 2023-01-10 VITALS — BP 117/77 | HR 94 | Resp 20 | Ht 70.0 in | Wt 164.4 lb

## 2023-01-10 DIAGNOSIS — J90 Pleural effusion, not elsewhere classified: Secondary | ICD-10-CM | POA: Diagnosis not present

## 2023-01-10 DIAGNOSIS — C3432 Malignant neoplasm of lower lobe, left bronchus or lung: Secondary | ICD-10-CM | POA: Diagnosis not present

## 2023-01-10 NOTE — Progress Notes (Signed)
[address]       Mitchell Frye [address]             [phone]     HPI: Mitchell Frye returns for follow-up of his pleural effusion.  Mitchell Frye is a 72 year old man with a past history of stage IIIb lung cancer, hypertension, hyperlipidemia, and chronic dyspnea.  Originally diagnosed with stage IIIb adenocarcinoma of the lung in 2021.  He was treated with chemoradiation followed by durvalumab.  That was stopped due to evidence of recurrence.  He had additional palliative radiation and chemotherapy which she completed in December 2023.  In October he had a follow-up with Dr. Smith Frye and complained of weight loss and shortness of breath.  CT showed a large left pleural effusion.  He had a small to moderate pleural effusion previously.  He initially refused thoracentesis and was sent to me.  He then agreed to a thoracentesis.  Thoracentesis was done on 12/26/2022.  He drained about 500 mL of bloody fluid.  Stopped due to discomfort.  No significant change in the appearance of his chest x-ray.  Cytology was negative.  Mostly blood with few nucleated cells.  Says his breathing improved the following morning and has remained improved since then.  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 (HCC)     Current Outpatient Medications  Medication Sig Dispense Refill   acetaminophen (TYLENOL) 500 MG tablet Take 1,000 mg by mouth every 6 (six) hours as needed for mild pain (pain score 1-3), moderate pain (pain score 4-6) or headache.     albuterol (VENTOLIN HFA) 108 (90 Base) MCG/ACT inhaler Inhale 2 puffs into the lungs every 4 (four) hours as needed for wheezing or shortness of breath. 8 g 1   ANORO ELLIPTA 62.5-25 MCG/ACT AEPB INHALE 1 PUFF INTO THE LUNGS DAILY 60 each 2   Budeson-Glycopyrrol-Formoterol (BREZTRI AEROSPHERE) 160-9-4.8 MCG/ACT AERO Inhale 2 puffs into the lungs in the  morning and at bedtime. 10.7 g 5   FLOVENT HFA 220 MCG/ACT inhaler Inhale 2 puffs into the lungs 2 (two) times daily.     folic acid (FOLVITE) 1 MG tablet Take 1 tablet (1 mg total) by mouth daily. 30 tablet 3   lidocaine (LIDODERM) 5 % Place 1 patch onto the skin daily. Remove & Discard patch within 12 hours or as directed by MD 30 patch 0   lidocaine-prilocaine (EMLA) cream Apply 1 application topically as needed. 30 g 3   meclizine (ANTIVERT) 25 MG tablet Take 1 tablet (25 mg total) by mouth 3 (three) times daily as needed for dizziness. 20 tablet 0   naloxegol oxalate (MOVANTIK) 25 MG TABS tablet Take 1 tablet (25 mg total) by mouth daily. (Patient not taking: Reported on 01/10/2023) 30 tablet 2   potassium chloride SA (KLOR-CON M) 20 MEQ tablet TAKE 1 TABLET(20 MEQ) BY MOUTH TWICE DAILY (Patient not taking: Reported on 01/10/2023) 20 tablet 0   No current facility-administered medications for this visit.    Physical Exam BP 117/77 (BP Location: Left Arm, Patient Position: Sitting, Cuff Size: Normal)   Pulse 94   Resp 20   Ht 5\' 10"  (1.778 m)   Wt 164 lb 6.4 oz (74.6 kg)   SpO2 98% Comment: RA  BMI 23.33 kg/m  72 year old man in no acute distress Alert and oriented x 3  with no focal deficits Lungs diminished on left Cardiac regular rate and rhythm with 2/6 systolic murmur  Diagnostic Tests: I personally reviewed his chest x-ray.  There is still a large left pleural effusion.  No change from his postthoracentesis film.  Impression: Mitchell Frye is a 72 year old man with a past history of stage IV lung cancer, hypertension, hyperlipidemia, chronic dyspnea, and a large left pleural effusion.    Left pleural effusion in setting of previously treated stage IIIb lung cancer with recurrence.  Concern for malignant effusion.  PET was relatively unremarkable.  Cytology on initial thoracentesis was negative but 1 negative cytology does not rule out the possibility of a malignant  effusion.  I had a long discussion with Mitchell Frye and his wife.  I personally do not think additional thoracentesis is a good provide much benefit.  I do not think we will be able to drain all the fluid.  The best option would be to do a VATS to drain the fluid and possibly decorticate the lung.  Is unclear if we will ever get that lung to fully reexpand because he has had a long-term pleural effusion.  He refused VATS and wishes to have another thoracentesis.  Will arrange with IR.  Plan: Thoracentesis to drain as much fluid as possible.  Will repeat cytology.   Return in 3 weeks with PA and lateral chest x-ray to discuss possible surgical intervention.  Loreli Slot, MD [facility] [phone]

## 2023-01-13 ENCOUNTER — Ambulatory Visit
Admission: RE | Admit: 2023-01-13 | Discharge: 2023-01-13 | Disposition: A | Discharge status: Home / Self Care | Payer: 59 | Source: Ambulatory Visit | Attending: Interventional Radiology | Admitting: Interventional Radiology

## 2023-01-13 ENCOUNTER — Other Ambulatory Visit (HOSPITAL_COMMUNITY): Payer: 59

## 2023-01-13 ENCOUNTER — Other Ambulatory Visit: Payer: Self-pay | Admitting: Interventional Radiology

## 2023-01-13 ENCOUNTER — Ambulatory Visit
Admission: RE | Admit: 2023-01-13 | Discharge: 2023-01-13 | Disposition: A | Discharge status: Home / Self Care | Payer: 59 | Source: Ambulatory Visit | Attending: Thoracic Surgery (Cardiothoracic Vascular Surgery) | Admitting: Thoracic Surgery (Cardiothoracic Vascular Surgery)

## 2023-01-13 DIAGNOSIS — J90 Pleural effusion, not elsewhere classified: Secondary | ICD-10-CM | POA: Diagnosis present

## 2023-01-13 DIAGNOSIS — Z9889 Other specified postprocedural states: Secondary | ICD-10-CM

## 2023-01-13 DIAGNOSIS — C3490 Malignant neoplasm of unspecified part of unspecified bronchus or lung: Secondary | ICD-10-CM | POA: Diagnosis not present

## 2023-01-13 MED ORDER — LIDOCAINE HCL (PF) 1 % IJ SOLN
10.0000 mL | Freq: Once | INTRAMUSCULAR | Status: AC
  Administered 2023-01-13: 10 mL via INTRADERMAL
  Filled 2023-01-13: qty 10

## 2023-01-17 LAB — CYTOLOGY - NON PAP

## 2023-02-01 ENCOUNTER — Other Ambulatory Visit: Payer: Self-pay | Admitting: Thoracic Surgery (Cardiothoracic Vascular Surgery)

## 2023-02-01 DIAGNOSIS — J90 Pleural effusion, not elsewhere classified: Secondary | ICD-10-CM

## 2023-02-06 ENCOUNTER — Ambulatory Visit (INDEPENDENT_AMBULATORY_CARE_PROVIDER_SITE_OTHER): Payer: 59 | Admitting: Thoracic Surgery (Cardiothoracic Vascular Surgery)

## 2023-02-06 ENCOUNTER — Ambulatory Visit
Admission: RE | Admit: 2023-02-06 | Discharge: 2023-02-06 | Disposition: A | Discharge status: Home / Self Care | Payer: 59 | Source: Ambulatory Visit | Attending: Thoracic Surgery (Cardiothoracic Vascular Surgery) | Admitting: Thoracic Surgery (Cardiothoracic Vascular Surgery)

## 2023-02-06 ENCOUNTER — Encounter: Payer: Self-pay | Admitting: Thoracic Surgery (Cardiothoracic Vascular Surgery)

## 2023-02-06 VITALS — BP 136/80 | HR 84 | Resp 20 | Ht 70.0 in | Wt 161.0 lb

## 2023-02-06 DIAGNOSIS — J90 Pleural effusion, not elsewhere classified: Secondary | ICD-10-CM | POA: Diagnosis not present

## 2023-02-06 NOTE — Progress Notes (Signed)
[address]       Mit[place] Frye [id number]             [phone]      HPI: [name] returns for follow-up of his left pleural effusion.  Mitchell Frye is a 72 year old man with a history of stage IIIb lung cancer, hypertension, hyperlipidemia, and chronic dyspnea.  Diagnosed with stage IIIb lung cancer in 2021.  Treated with chemoradiation followed by durvalumab.  That was stopped due to recurrence and then had additional palliative radiation and chemotherapy which finished in December 2023.  In October he presented to Dr. Smith Frye with the complaint of weight loss and shortness of breath.  He was found to have a large left pleural effusion.  Had previously had a small to moderate effusion.  He had a thoracentesis on 12/26/2022.  About 500 mL of fluid was drained.  Cytology was negative.  I saw him and recommended VATS, but he refused and wanted to have another thoracentesis.  That was done but only drained about 50 mL of fluid again cytology was negative.  He tells me now that his breathing is "pretty good."  Otherwise feels well and is not interested in having any surgery.  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 (HCC)      Current Outpatient Medications  Medication Sig Dispense Refill   acetaminophen (TYLENOL) 500 MG tablet Take 1,000 mg by mouth every 6 (six) hours as needed for mild pain (pain score 1-3), moderate pain (pain score 4-6) or headache.     albuterol (VENTOLIN HFA) 108 (90 Base) MCG/ACT inhaler Inhale 2 puffs into the lungs every 4 (four) hours as needed for wheezing or shortness of breath. 8 g 1   ANORO ELLIPTA 62.5-25 MCG/ACT AEPB INHALE 1 PUFF INTO THE LUNGS DAILY 60 each 2   Budeson-Glycopyrrol-Formoterol (BREZTRI AEROSPHERE) 160-9-4.8 MCG/ACT AERO Inhale 2 puffs into the lungs in the morning and at bedtime. 10.7 g 5   FLOVENT HFA 220 MCG/ACT inhaler  Inhale 2 puffs into the lungs 2 (two) times daily.     folic acid (FOLVITE) 1 MG tablet Take 1 tablet (1 mg total) by mouth daily. 30 tablet 3   lidocaine (LIDODERM) 5 % Place 1 patch onto the skin daily. Remove & Discard patch within 12 hours or as directed by MD 30 patch 0   lidocaine-prilocaine (EMLA) cream Apply 1 application topically as needed. 30 g 3   meclizine (ANTIVERT) 25 MG tablet Take 1 tablet (25 mg total) by mouth 3 (three) times daily as needed for dizziness. 20 tablet 0   naloxegol oxalate (MOVANTIK) 25 MG TABS tablet Take 1 tablet (25 mg total) by mouth daily. 30 tablet 2   potassium chloride SA (KLOR-CON M) 20 MEQ tablet TAKE 1 TABLET(20 MEQ) BY MOUTH TWICE DAILY 20 tablet 0   No current facility-administered medications for this visit.    Physical Exam BP 136/80   Pulse 84   Resp 20   Ht 5\' 10"  (1.778 m)   Wt 161 lb (73 kg)   SpO2 98% Comment: RA  BMI 23.62 kg/m  72 year old man in no acute distress Alert and oriented x 3 with no focal deficits Lungs diminished breath sounds on left  Diagnostic Tests: I personally reviewed his chest x-ray.  Shows no change in a large left pleural  effusion.  Impression: Mitchell Frye is a 72 year old man with a history of stage IIIb lung cancer, hypertension, hyperlipidemia, chronic dyspnea, and a left pleural effusion.  Left pleural effusion-no change from his film a month ago.  I do not think additional thoracentesis is useful in this setting.  I again recommended that we proceed with VATS for drainage of the effusion and decortication of the lung.  After discussion he does not want to undergo any surgical procedure.    Plan: Follow-up with Dr. Smith Frye I would be happy to see Mitchell Frye back if he were to change his mind about having VATS.  Mitchell Slot, MD [facility] [phone]

## 2023-02-16 ENCOUNTER — Encounter: Payer: Self-pay | Admitting: Oncology

## 2023-02-16 NOTE — Telephone Encounter (Signed)
 Error

## 2023-02-28 ENCOUNTER — Encounter: Payer: Self-pay | Admitting: Oncology

## 2023-03-01 ENCOUNTER — Ambulatory Visit: Admission: RE | Admit: 2023-03-01 | Payer: 59 | Source: Ambulatory Visit

## 2023-03-15 ENCOUNTER — Telehealth: Payer: Self-pay | Admitting: Oncology

## 2023-03-15 NOTE — Telephone Encounter (Signed)
Pt sister called to confirm his follow up appointment. Looks like he missed his CT on 03/01/23. Would you like to reschedule MD follow up?

## 2023-03-15 NOTE — Telephone Encounter (Signed)
Please reschedule his ct asap and see me 1 week post ct

## 2023-03-18 ENCOUNTER — Encounter: Payer: Self-pay | Admitting: Oncology

## 2023-03-20 ENCOUNTER — Inpatient Hospital Stay: Payer: 59

## 2023-03-20 ENCOUNTER — Inpatient Hospital Stay: Payer: 59 | Admitting: Oncology

## 2023-03-23 ENCOUNTER — Ambulatory Visit
Admission: RE | Admit: 2023-03-23 | Discharge: 2023-03-23 | Disposition: A | Discharge status: Home / Self Care | Payer: 59 | Source: Ambulatory Visit | Attending: Oncology | Admitting: Oncology

## 2023-03-23 DIAGNOSIS — C3432 Malignant neoplasm of lower lobe, left bronchus or lung: Secondary | ICD-10-CM | POA: Diagnosis present

## 2023-03-23 DIAGNOSIS — E538 Deficiency of other specified B group vitamins: Secondary | ICD-10-CM | POA: Insufficient documentation

## 2023-03-23 DIAGNOSIS — D649 Anemia, unspecified: Secondary | ICD-10-CM | POA: Diagnosis present

## 2023-04-11 ENCOUNTER — Inpatient Hospital Stay: Payer: 59 | Attending: Oncology

## 2023-04-11 ENCOUNTER — Encounter: Payer: Self-pay | Admitting: Oncology

## 2023-04-11 ENCOUNTER — Inpatient Hospital Stay (HOSPITAL_BASED_OUTPATIENT_CLINIC_OR_DEPARTMENT_OTHER): Payer: 59 | Admitting: Oncology

## 2023-04-11 DIAGNOSIS — Z95828 Presence of other vascular implants and grafts: Secondary | ICD-10-CM

## 2023-04-11 DIAGNOSIS — D6481 Anemia due to antineoplastic chemotherapy: Secondary | ICD-10-CM | POA: Insufficient documentation

## 2023-04-11 DIAGNOSIS — I3139 Other pericardial effusion (noninflammatory): Secondary | ICD-10-CM | POA: Insufficient documentation

## 2023-04-11 DIAGNOSIS — Z9221 Personal history of antineoplastic chemotherapy: Secondary | ICD-10-CM | POA: Insufficient documentation

## 2023-04-11 DIAGNOSIS — J90 Pleural effusion, not elsewhere classified: Secondary | ICD-10-CM

## 2023-04-11 DIAGNOSIS — Z87891 Personal history of nicotine dependence: Secondary | ICD-10-CM | POA: Insufficient documentation

## 2023-04-11 DIAGNOSIS — Z923 Personal history of irradiation: Secondary | ICD-10-CM | POA: Diagnosis not present

## 2023-04-11 DIAGNOSIS — D649 Anemia, unspecified: Secondary | ICD-10-CM

## 2023-04-11 DIAGNOSIS — T451X5A Adverse effect of antineoplastic and immunosuppressive drugs, initial encounter: Secondary | ICD-10-CM

## 2023-04-11 DIAGNOSIS — E538 Deficiency of other specified B group vitamins: Secondary | ICD-10-CM

## 2023-04-11 DIAGNOSIS — C3432 Malignant neoplasm of lower lobe, left bronchus or lung: Secondary | ICD-10-CM | POA: Diagnosis present

## 2023-04-11 LAB — CBC
HCT: 30 % — ABNORMAL LOW (ref 39.0–52.0)
Hemoglobin: 9.1 g/dL — ABNORMAL LOW (ref 13.0–17.0)
MCH: 22.6 pg — ABNORMAL LOW (ref 26.0–34.0)
MCHC: 30.3 g/dL (ref 30.0–36.0)
MCV: 74.4 fL — ABNORMAL LOW (ref 80.0–100.0)
Platelets: 382 10*3/uL (ref 150–400)
RBC: 4.03 MIL/uL — ABNORMAL LOW (ref 4.22–5.81)
RDW: 19.9 % — ABNORMAL HIGH (ref 11.5–15.5)
WBC: 6.6 10*3/uL (ref 4.0–10.5)
nRBC: 0 % (ref 0.0–0.2)

## 2023-04-11 LAB — IRON AND TIBC
Iron: 22 ug/dL — ABNORMAL LOW (ref 45–182)
Saturation Ratios: 10 % — ABNORMAL LOW (ref 17.9–39.5)
TIBC: 224 ug/dL — ABNORMAL LOW (ref 250–450)
UIBC: 202 ug/dL

## 2023-04-11 LAB — VITAMIN B12: Vitamin B-12: 446 pg/mL (ref 180–914)

## 2023-04-11 LAB — FERRITIN: Ferritin: 567 ng/mL — ABNORMAL HIGH (ref 24–336)

## 2023-04-11 MED ORDER — SODIUM CHLORIDE 0.9% FLUSH
10.0000 mL | Freq: Once | INTRAVENOUS | Status: AC
  Administered 2023-04-11: 10 mL via INTRAVENOUS
  Filled 2023-04-11: qty 10

## 2023-04-11 MED ORDER — HEPARIN SOD (PORK) LOCK FLUSH 100 UNIT/ML IV SOLN
500.0000 [IU] | Freq: Once | INTRAVENOUS | Status: AC
  Administered 2023-04-11: 500 [IU] via INTRAVENOUS
  Filled 2023-04-11: qty 5

## 2023-04-16 ENCOUNTER — Encounter: Payer: Self-pay | Admitting: Oncology

## 2023-04-16 NOTE — Progress Notes (Signed)
 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  He underwent CT angios chest which did not show any PE.  Soft tissue attenuation measuring 2.9 x 4 cm centered in the left hilum resulting in abrupt angulation and narrowing of the pulmonary arteries and the central left lower lobe airways.  Additional ipsilateral hilar and subcarinal adenopathy.  No contralateral adenopathy.  Consolidative masslike opacity 3.6 x 3.1 cm in size and contiguous with more central perihilar soft tissue attenuation.  Overall findings concerning for primary bronchogenic carcinoma.    Patient lives with his sister who is his main caregiver.  He does not drive but is independent of his ADLs.  Reports ongoing fatigue and occasional retrosternal chest pain.  He has been evaluated by GI in the past for reflux as well.  Appetie is fair and weight is stable.  Denies any significant shortness of breath at this time.  Patient is an ex-smoker and smoked for about 20 years but quit smoking back in 1994   PET CT scan showed hypermetabolic mass in the left lower lobe with mediastinal adenopathy and right paratracheal adenopathy.  No evidence of distant metastatic disease.  Pathology was consistent with non-small cell lung cancer favor adenocarcinoma.  Cells  were positive for TTF-1 and negative for p40.   Patient started concurrent carbotaxol radiation and then had allergic reaction to Taxol for which she was switched to Abraxane.  Given the short supply of Abraxane patient received carbo Alimta midway through his concurrent chemoradiation.  Scans post chemoradiation showed partial response and patient will be started on maintenance durvalumab on 01/14/2020.  Patient was on maintenance durvalumab and received 15 cycles up until August 2022.  He was noted to have worsening chest pain and was in the ER multiple times in September 2022.  Repeat CT scan showed concern for hilar recurrence.  Patient received palliative radiation to the growing left upper lobe lung massWas given carbo Alimta for 4 cycles.  He was on maintenance Alimta which was stopped in December 2023 due to worsening anemia and AKI    Interval history-reports occasional left chest wall pain and exertional shortness of breath which is worsened over the last 2 weeks  ECOG PS- 2 Pain scale- 3 Opioid associated constipation- no  Review of systems- Review of Systems  Constitutional:  Positive for malaise/fatigue. Negative for chills, fever and weight loss.  HENT:  Negative for congestion, ear discharge and nosebleeds.   Eyes:  Negative for blurred vision.  Respiratory:  Positive for shortness of breath. Negative for cough, hemoptysis, sputum production and wheezing.   Cardiovascular:  Negative for chest pain, palpitations, orthopnea and claudication.  Gastrointestinal:  Negative for abdominal pain, blood in stool, constipation, diarrhea, heartburn, melena, nausea and vomiting.  Genitourinary:  Negative for dysuria, flank pain, frequency, hematuria and urgency.  Musculoskeletal:  Negative for back pain, joint pain  and myalgias.  Skin:  Negative for rash.  Neurological:  Negative for dizziness, tingling, focal weakness, seizures, weakness and headaches.  Endo/Heme/Allergies:  Does not  bruise/bleed easily.  Psychiatric/Behavioral:  Negative for depression and suicidal ideas. The patient does not have insomnia.       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 (HCC)      Past Surgical History:  Procedure Laterality Date   ESOPHAGOGASTRODUODENOSCOPY (EGD) WITH PROPOFOL N/A 12/01/2020   Procedure: ESOPHAGOGASTRODUODENOSCOPY (EGD) WITH PROPOFOL;  Surgeon: Toney Reil, MD;  Location: [facility] ENDOSCOPY;  Service: Gastroenterology;  Laterality: N/A;   IR THORACENTESIS ASP PLEURAL SPACE W/IMG GUIDE  12/26/2022   PORTA CATH INSERTION N/A 10/22/2019   Procedure: PORTA CATH INSERTION;  Surgeon: Renford Dills, MD;  Location: [facility] INVASIVE CV LAB;  Service: Cardiovascular;  Laterality: N/A;   VIDEO BRONCHOSCOPY WITH ENDOBRONCHIAL ULTRASOUND N/A 09/25/2019   Procedure: VIDEO BRONCHOSCOPY WITH ENDOBRONCHIAL ULTRASOUND;  Surgeon: Salena Saner, MD;  Location: [facility] ORS;  Service: Pulmonary;  Laterality: N/A;    Social History   Socioeconomic History   Marital status: Single    Spouse name: Not on file   Number of children: Not on file   Years of education: Not on file   Highest education level: Not on file  Occupational History   Not on file  Tobacco Use   Smoking status: Former    Current packs/day: 0.00    Average packs/day: 1 pack/day for 20.0 years (20.0 ttl pk-yrs)    Types: Cigarettes    Start date: 33    Quit date: 25    Years since quitting: 31.1   Smokeless tobacco: Never  Vaping Use   Vaping status: Never Used  Substance and Sexual Activity   Alcohol use: Not Currently    Comment: not drank any beer in 2 onths   Drug use: Never   Sexual activity: Not Currently  Other Topics Concern   Not on file  Social History  Narrative   Not on file   Social Drivers of Health   Financial Resource Strain: Not on file  Food Insecurity: Not on file  Transportation Needs: Unmet Transportation Needs (08/05/2022)   PRAPARE - Administrator, Civil Service (Medical): Yes    Lack of Transportation (Non-Medical): Yes  Physical Activity: Not on file  Stress: Not on file  Social Connections: Not on file  Intimate Partner Violence: Not on file    Family History  Problem Relation Age of Onset   Cancer Brother      Current Outpatient Medications:    acetaminophen (TYLENOL) 500 MG tablet, Take 1,000 mg by mouth every 6 (six) hours as needed for mild pain (pain score 1-3), moderate pain (pain score 4-6) or headache., Disp: , Rfl:    albuterol (VENTOLIN HFA) 108 (90 Base) MCG/ACT inhaler, Inhale 2 puffs into the lungs every 4 (four) hours as needed for wheezing or shortness of breath., Disp: 8 g, Rfl: 1   ANORO ELLIPTA 62.5-25 MCG/ACT AEPB, INHALE 1 PUFF INTO THE LUNGS DAILY, Disp: 60 each, Rfl: 2   Budeson-Glycopyrrol-Formoterol (BREZTRI AEROSPHERE) 160-9-4.8 MCG/ACT AERO, Inhale 2 puffs into the lungs in the morning and at bedtime., Disp: 10.7 g,  Rfl: 5   FLOVENT HFA 220 MCG/ACT inhaler, Inhale 2 puffs into the lungs 2 (two) times daily., Disp: , Rfl:    folic acid (FOLVITE) 1 MG tablet, Take 1 tablet (1 mg total) by mouth daily., Disp: 30 tablet, Rfl: 3   lidocaine (LIDODERM) 5 %, Place 1 patch onto the skin daily. Remove & Discard patch within 12 hours or as directed by MD, Disp: 30 patch, Rfl: 0   lidocaine-prilocaine (EMLA) cream, Apply 1 application topically as needed., Disp: 30 g, Rfl: 3   meclizine (ANTIVERT) 25 MG tablet, Take 1 tablet (25 mg total) by mouth 3 (three) times daily as needed for dizziness., Disp: 20 tablet, Rfl: 0   naloxegol oxalate (MOVANTIK) 25 MG TABS tablet, Take 1 tablet (25 mg total) by mouth daily., Disp: 30 tablet, Rfl: 2   potassium chloride SA (KLOR-CON M) 20 MEQ tablet, TAKE  1 TABLET(20 MEQ) BY MOUTH TWICE DAILY, Disp: 20 tablet, Rfl: 0  Physical exam: There were no vitals filed for this visit. Physical Exam Cardiovascular:     Rate and Rhythm: Regular rhythm. Tachycardia present.     Heart sounds: Normal heart sounds.  Pulmonary:     Effort: Pulmonary effort is normal.     Comments: Breath sounds decreased over left side diffusely Abdominal:     General: Bowel sounds are normal.     Palpations: Abdomen is soft.  Skin:    General: Skin is warm and dry.  Neurological:     Mental Status: He is alert and oriented to person, place, and time.         Latest Ref Rng & Units 12/05/2022    9:45 AM  CMP  Glucose 70 - 99 mg/dL 161   BUN 8 - 23 mg/dL 14   Creatinine 0.96 - 1.24 mg/dL 0.45   Sodium 409 - 811 mmol/L 136   Potassium 3.5 - 5.1 mmol/L 3.7   Chloride 98 - 111 mmol/L 104   CO2 22 - 32 mmol/L 23   Calcium 8.9 - 10.3 mg/dL 8.9   Total Protein 6.5 - 8.1 g/dL 8.1   Total Bilirubin 0.3 - 1.2 mg/dL 0.7   Alkaline Phos 38 - 126 U/L 60   AST 15 - 41 U/L 15   ALT 0 - 44 U/L 9       Latest Ref Rng & Units 04/11/2023    2:30 PM  CBC  WBC 4.0 - 10.5 K/uL 6.6   Hemoglobin 13.0 - 17.0 g/dL 9.1   Hematocrit 91.4 - 52.0 % 30.0   Platelets 150 - 400 K/uL 382     No images are attached to the encounter.  CT Chest Wo Contrast Result Date: 04/06/2023 CLINICAL DATA:  Malignant neoplasm of lower lobe of left lung. * Tracking Code: BO * EXAM: CT CHEST WITHOUT CONTRAST TECHNIQUE: Multidetector CT imaging of the chest was performed following the standard protocol without IV contrast. RADIATION DOSE REDUCTION: This exam was performed according to the departmental dose-optimization program which includes automated exposure control, adjustment of the mA and/or kV according to patient size and/or use of iterative reconstruction technique. COMPARISON:  CT scan chest from 11/11/2022 and PET-CT scan from 12/12/2022. FINDINGS: Cardiovascular: Normal cardiac size. There  is new small-to-moderate pericardial effusion. No aortic aneurysm. Mediastinum/Nodes: Visualized thyroid gland appears grossly unremarkable. No solid / cystic mediastinal masses. The esophagus is nondistended precluding optimal assessment. Small mediastinal lymph nodes (series 2, image 44), exhibited mild FDG avidity on the prior PET-CT  scan. No interval change in the size. No other mediastinal or axillary lymphadenopathy by size criteria. Evaluation of bilateral hila is limited due to lack on intravenous contrast: however, no large hilar lymphadenopathy identified. Lungs/Pleura: The central tracheo-bronchial tree is patent. Redemonstration of moderate left pleural effusion without significant interval change since the prior study. There is associated complete collapse of the left lung lower lobe and subsegmental atelectatic changes in the lingula, also essentially unchanged since the prior study. There is small right pleural effusion, new since the prior study. There is a subcentimeter sized nodule along the minor fissure (series 7, image 40), which is not well evaluated due to extensive motion at this level however, is grossly similar to the prior study and may represent intra fissural lymph node. No new or suspicious lung nodule seen. Remaining bilateral lungs are otherwise unremarkable. Upper Abdomen: Subtle hypoattenuating lesion in the left kidney upper pole was characterized as a cyst on the prior exam. Remaining visualized upper abdominal viscera within normal limits. Musculoskeletal: A CT Port-a-Cath is seen in the right upper chest wall with the catheter terminating in the cavo-atrial junction region. Visualized soft tissues of the chest wall are otherwise grossly unremarkable. No suspicious osseous lesions. There are mild multilevel degenerative changes in the visualized spine. IMPRESSION: 1. Redemonstration of moderate left pleural effusion with associated complete collapse of the left lung lower lobe  and subsegmental atelectatic changes in the lingula, essentially unchanged since the prior study. 2. There is a new small right pleural effusion. 3. There is a new small-to-moderate pericardial effusion. 4. No new or suspicious lung nodule seen. 5. Multiple other nonacute observations, as described above. Electronically Signed   By: Jules Schick M.D.   On: 04/06/2023 19:02     Assessment and plan- Patient is a 73 y.o. male with history of recurrent [name] adenocarcinoma of the lung cT2 N3 M0.  He is here to discuss CT scan results and further management   I have reviewed CT chest  imagesIndependently and discussed findings with the patient which presently does not show any evidence of recurrent or progressive disease.  He has had recurrent left pleural effusion for which she has seen both pulmonary and CT surgery.  We have attempted thoracentesis in the past which has not been really successful in draining his pleural effusion likely secondary to the inflammatory nature of the fluid which is not amenable for thoracentesis.  Dr. Dorris Fetch from CT surgery has clearly explained to him that he needs VATS assisted drainage and decortication of the lung as a definitive management of his pleural effusion but patient was hesitant to proceed with that as he was concerned about potential left lung collapse.  I explained to the patient that unfortunately there is not much of a viable alternative for him at this time I am referring him back to Dr. Jayme Cloud to see if she has any other options for him.  Also given the fact that CT chest showed small to moderate pericardial effusion we are planning to get an echocardiogram for him.  He had similar findings noted in the past as well but subsequent echocardiogram had not shown significant effusion at that time.  When patient gets anxious his saturations momentarily dropped but otherwise he is maintaining his oxygen saturation more than 90% at rest and on ambulation and  does not qualify for home oxygen at this time.  If patient's shortness of breath gets worse I have advised him to go to [facility], ER  autotoxicity center like [facility] or [facility] given that we have not been able to drain his pleural effusion by thoracentesis and we do not have access to CT surgery here at [place]    Visit Diagnosis 1. 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)   2. Antineoplastic chemotherapy induced anemia   3. Recurrent pleural effusion      Dr. Owens Shark, MD, MPH [facility] [phone] 04/16/2023 3:23 PM

## 2023-04-17 ENCOUNTER — Other Ambulatory Visit: Payer: Self-pay

## 2023-04-17 DIAGNOSIS — C3432 Malignant neoplasm of lower lobe, left bronchus or lung: Secondary | ICD-10-CM

## 2023-04-18 ENCOUNTER — Other Ambulatory Visit: Payer: Self-pay | Admitting: *Deleted

## 2023-04-18 DIAGNOSIS — J90 Pleural effusion, not elsewhere classified: Secondary | ICD-10-CM

## 2023-04-23 ENCOUNTER — Other Ambulatory Visit: Payer: Self-pay

## 2023-04-23 ENCOUNTER — Encounter: Payer: Self-pay | Admitting: Emergency Medicine

## 2023-04-23 ENCOUNTER — Encounter (HOSPITAL_COMMUNITY): Payer: Self-pay | Admitting: Family Medicine

## 2023-04-23 ENCOUNTER — Emergency Department

## 2023-04-23 ENCOUNTER — Emergency Department
Admission: EM | Admit: 2023-04-23 | Discharge: 2023-04-23 | Disposition: A | Discharge status: Other Acute Inpatient Hospital | Attending: Student in an Organized Health Care Education/Training Program | Admitting: Student in an Organized Health Care Education/Training Program

## 2023-04-23 ENCOUNTER — Inpatient Hospital Stay (HOSPITAL_COMMUNITY)
Admission: AD | Admit: 2023-04-23 | Discharge: 2023-05-02 | DRG: 163 | Disposition: A | Discharge status: Home Health | Attending: Internal Medicine | Admitting: Internal Medicine

## 2023-04-23 ENCOUNTER — Encounter (HOSPITAL_COMMUNITY): Payer: Self-pay

## 2023-04-23 DIAGNOSIS — J910 Malignant pleural effusion: Secondary | ICD-10-CM | POA: Diagnosis present

## 2023-04-23 DIAGNOSIS — J9601 Acute respiratory failure with hypoxia: Secondary | ICD-10-CM | POA: Diagnosis not present

## 2023-04-23 DIAGNOSIS — E785 Hyperlipidemia, unspecified: Secondary | ICD-10-CM | POA: Diagnosis present

## 2023-04-23 DIAGNOSIS — Z923 Personal history of irradiation: Secondary | ICD-10-CM

## 2023-04-23 DIAGNOSIS — Z888 Allergy status to other drugs, medicaments and biological substances status: Secondary | ICD-10-CM

## 2023-04-23 DIAGNOSIS — I314 Cardiac tamponade: Secondary | ICD-10-CM | POA: Diagnosis present

## 2023-04-23 DIAGNOSIS — F419 Anxiety disorder, unspecified: Secondary | ICD-10-CM | POA: Diagnosis present

## 2023-04-23 DIAGNOSIS — E8809 Other disorders of plasma-protein metabolism, not elsewhere classified: Secondary | ICD-10-CM | POA: Diagnosis present

## 2023-04-23 DIAGNOSIS — C3490 Malignant neoplasm of unspecified part of unspecified bronchus or lung: Secondary | ICD-10-CM | POA: Diagnosis not present

## 2023-04-23 DIAGNOSIS — R0600 Dyspnea, unspecified: Secondary | ICD-10-CM

## 2023-04-23 DIAGNOSIS — J90 Pleural effusion, not elsewhere classified: Secondary | ICD-10-CM | POA: Diagnosis present

## 2023-04-23 DIAGNOSIS — I119 Hypertensive heart disease without heart failure: Secondary | ICD-10-CM | POA: Diagnosis present

## 2023-04-23 DIAGNOSIS — Z87891 Personal history of nicotine dependence: Secondary | ICD-10-CM

## 2023-04-23 DIAGNOSIS — I714 Abdominal aortic aneurysm, without rupture, unspecified: Secondary | ICD-10-CM | POA: Diagnosis present

## 2023-04-23 DIAGNOSIS — E440 Moderate protein-calorie malnutrition: Secondary | ICD-10-CM | POA: Insufficient documentation

## 2023-04-23 DIAGNOSIS — G9341 Metabolic encephalopathy: Secondary | ICD-10-CM | POA: Diagnosis not present

## 2023-04-23 DIAGNOSIS — Z9221 Personal history of antineoplastic chemotherapy: Secondary | ICD-10-CM

## 2023-04-23 DIAGNOSIS — C3432 Malignant neoplasm of lower lobe, left bronchus or lung: Principal | ICD-10-CM | POA: Diagnosis present

## 2023-04-23 DIAGNOSIS — E876 Hypokalemia: Secondary | ICD-10-CM | POA: Diagnosis present

## 2023-04-23 DIAGNOSIS — I493 Ventricular premature depolarization: Secondary | ICD-10-CM | POA: Diagnosis present

## 2023-04-23 DIAGNOSIS — R079 Chest pain, unspecified: Secondary | ICD-10-CM | POA: Diagnosis present

## 2023-04-23 DIAGNOSIS — D630 Anemia in neoplastic disease: Secondary | ICD-10-CM | POA: Diagnosis present

## 2023-04-23 DIAGNOSIS — J9382 Other air leak: Secondary | ICD-10-CM | POA: Diagnosis not present

## 2023-04-23 DIAGNOSIS — I10 Essential (primary) hypertension: Secondary | ICD-10-CM | POA: Diagnosis not present

## 2023-04-23 DIAGNOSIS — I3139 Other pericardial effusion (noninflammatory): Secondary | ICD-10-CM | POA: Insufficient documentation

## 2023-04-23 DIAGNOSIS — R0602 Shortness of breath: Secondary | ICD-10-CM | POA: Diagnosis present

## 2023-04-23 DIAGNOSIS — N179 Acute kidney failure, unspecified: Secondary | ICD-10-CM | POA: Diagnosis not present

## 2023-04-23 DIAGNOSIS — R0789 Other chest pain: Secondary | ICD-10-CM

## 2023-04-23 DIAGNOSIS — Z6826 Body mass index (BMI) 26.0-26.9, adult: Secondary | ICD-10-CM

## 2023-04-23 DIAGNOSIS — D509 Iron deficiency anemia, unspecified: Secondary | ICD-10-CM | POA: Diagnosis present

## 2023-04-23 DIAGNOSIS — E441 Mild protein-calorie malnutrition: Secondary | ICD-10-CM | POA: Diagnosis not present

## 2023-04-23 DIAGNOSIS — Z7951 Long term (current) use of inhaled steroids: Secondary | ICD-10-CM

## 2023-04-23 DIAGNOSIS — Z79899 Other long term (current) drug therapy: Secondary | ICD-10-CM

## 2023-04-23 LAB — BASIC METABOLIC PANEL
Anion gap: 10 (ref 5–15)
BUN: 16 mg/dL (ref 8–23)
CO2: 24 mmol/L (ref 22–32)
Calcium: 8.9 mg/dL (ref 8.9–10.3)
Chloride: 108 mmol/L (ref 98–111)
Creatinine, Ser: 1.12 mg/dL (ref 0.61–1.24)
GFR, Estimated: >60 mL/min (ref >60)
Glucose, Bld: 174 mg/dL — ABNORMAL HIGH (ref 70–99)
Potassium: 3.3 mmol/L — ABNORMAL LOW (ref 3.5–5.1)
Sodium: 142 mmol/L (ref 135–145)

## 2023-04-23 LAB — CBC
HCT: 36.4 % — ABNORMAL LOW (ref 39.0–52.0)
Hemoglobin: 10.8 g/dL — ABNORMAL LOW (ref 13.0–17.0)
MCH: 22.4 pg — ABNORMAL LOW (ref 26.0–34.0)
MCHC: 29.7 g/dL — ABNORMAL LOW (ref 30.0–36.0)
MCV: 75.5 fL — ABNORMAL LOW (ref 80.0–100.0)
Platelets: 401 10*3/uL — ABNORMAL HIGH (ref 150–400)
RBC: 4.82 MIL/uL (ref 4.22–5.81)
RDW: 20.6 % — ABNORMAL HIGH (ref 11.5–15.5)
WBC: 6.8 10*3/uL (ref 4.0–10.5)
nRBC: 0 % (ref 0.0–0.2)

## 2023-04-23 LAB — TROPONIN I (HIGH SENSITIVITY)
Troponin I (High Sensitivity): 8 ng/L (ref <18)
Troponin I (High Sensitivity): 9 ng/L (ref <18)

## 2023-04-23 MED ORDER — ACETAMINOPHEN 650 MG RE SUPP: 650.0000 mg | Freq: Four times a day (QID) | RECTAL | Status: DC | PRN

## 2023-04-23 MED ORDER — OXYCODONE HCL 5 MG PO TABS
5.0000 mg | ORAL_TABLET | ORAL | Status: DC | PRN
  Administered 2023-04-24 – 2023-05-01 (×16): 5 mg via ORAL
  Filled 2023-04-23 (×16): qty 1

## 2023-04-23 MED ORDER — SODIUM CHLORIDE 0.9% FLUSH
3.0000 mL | Freq: Two times a day (BID) | INTRAVENOUS | Status: DC
  Administered 2023-04-23 – 2023-05-02 (×16): 3 mL via INTRAVENOUS

## 2023-04-23 MED ORDER — ONDANSETRON HCL 4 MG PO TABS
4.0000 mg | ORAL_TABLET | Freq: Four times a day (QID) | ORAL | Status: DC | PRN
  Administered 2023-05-02: 4 mg via ORAL
  Filled 2023-04-23: qty 1

## 2023-04-23 MED ORDER — POLYETHYLENE GLYCOL 3350 17 G PO PACK: 17.0000 g | PACK | Freq: Every day | ORAL | Status: DC | PRN

## 2023-04-23 MED ORDER — POTASSIUM CHLORIDE 20 MEQ PO PACK
40.0000 meq | PACK | Freq: Once | ORAL | Status: AC
  Administered 2023-04-23: 40 meq via ORAL
  Filled 2023-04-23: qty 2

## 2023-04-23 MED ORDER — ONDANSETRON HCL 4 MG/2ML IJ SOLN: 4.0000 mg | Freq: Four times a day (QID) | INTRAMUSCULAR | Status: DC | PRN

## 2023-04-23 MED ORDER — GUAIFENESIN 100 MG/5ML PO LIQD
5.0000 mL | ORAL | Status: DC | PRN
Start: 2023-04-23 — End: 2023-05-02
  Administered 2023-04-27 – 2023-05-01 (×12): 5 mL via ORAL
  Filled 2023-04-23 (×6): qty 10
  Filled 2023-04-23: qty 5
  Filled 2023-04-23 (×5): qty 10

## 2023-04-23 MED ORDER — ACETAMINOPHEN 325 MG PO TABS: 650.0000 mg | ORAL_TABLET | Freq: Four times a day (QID) | ORAL | Status: DC | PRN

## 2023-04-23 NOTE — H&P (Signed)
 History and Physical    Mitchell Frye WUJ:[id number] DOB: [birth year] DOA: 04/23/2023  PCP: [facility]   Patient coming from: Home   Chief Complaint: SOB, chest pain  HPI: Mitchell Frye is a 73 y.o. male with medical history significant for hypertension, hyperlipidemia, non-small cell lung cancer, and recurrent left pleural effusion who presents with shortness of breath and chest pain.  Patient complains of shortness of breath that has been present for months but slowly worsening.  He is unable to perform any activities now without significant dyspnea.  His chronic cough has not changed recently and he denies fever or chills.  He also reports chest pain that is localized to the lateral chest wall and worse with deep breath or cough.  He was diagnosed with stage IIIb lung cancer in 2021 and was treated with chemoradiation followed by Durvalumab.  Durvalumab was stopped due to recurrence and he had additional palliative radiation and chemotherapy which was completed in December 2023.    He has had thoracentesis x 2.  He was seen by cardiothoracic surgery, it was not felt that additional thoracenteses would be useful for this patient, and VATS for drainage of the effusion and decortication of the lung was recommended.  Patient had initially refused to undergo this procedure but has since changed his mind and is wanting to proceed.  [facility] ED Course: Upon arrival to the ED, patient is found to be afebrile and saturating low 90s on room air with mild tachypnea, tachycardia, and elevated blood pressure.  Labs are most notable for potassium 3.3, creatinine 1.12, WBC 6.8, hemoglobin 10.8, platelets 401,000, and troponin normal x 2.  Chest x-ray reveals stable moderate size loculated left pleural effusion.  Cardiothoracic surgery (Dr. Laneta Simmers) was consulted by the ED physician and medical admission to [facility] was advised.  Patient was transferred to [facility].  Review of Systems:  All other systems reviewed and apart from HPI, are negative.  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 (HCC)     Past Surgical History:  Procedure Laterality Date   ESOPHAGOGASTRODUODENOSCOPY (EGD) WITH PROPOFOL N/A 12/01/2020   Procedure: ESOPHAGOGASTRODUODENOSCOPY (EGD) WITH PROPOFOL;  Surgeon: Toney Reil, MD;  Location: [facility] ENDOSCOPY;  Service: Gastroenterology;  Laterality: N/A;   IR THORACENTESIS ASP PLEURAL SPACE W/IMG GUIDE  12/26/2022   PORTA CATH INSERTION N/A 10/22/2019   Procedure: PORTA CATH INSERTION;  Surgeon: Renford Dills, MD;  Location: [facility] INVASIVE CV LAB;  Service: Cardiovascular;  Laterality: N/A;   VIDEO BRONCHOSCOPY WITH ENDOBRONCHIAL ULTRASOUND N/A 09/25/2019   Procedure: VIDEO BRONCHOSCOPY WITH ENDOBRONCHIAL ULTRASOUND;  Surgeon: Salena Saner, MD;  Location: [facility] ORS;  Service: Pulmonary;  Laterality: N/A;    Social History:   reports that he quit smoking about 31 years ago. His smoking use included cigarettes. He started smoking about 51 years ago. He has a 20 pack-year smoking history. He has never used smokeless tobacco. He reports that he does not currently use alcohol. He reports that he does not use drugs.  Allergies  Allergen Reactions   Taxol [Paclitaxel] Other (See Comments)    Chest pain, hip pain, coughing , wheezing    Family History  Problem Relation Age of Onset   Cancer Brother      Prior to Admission medications   Medication Sig Start Date End Date Taking? Authorizing  Provider  acetaminophen (TYLENOL) 500 MG tablet Take 1,000 mg by mouth every 6 (six) hours as needed for mild pain (pain score 1-3), moderate pain (pain score 4-6) or headache.    [provider]  albuterol (VENTOLIN HFA) 108 (90 Base) MCG/ACT inhaler Inhale 2 puffs into the lungs every 4 (four) hours as needed for  wheezing or shortness of breath. 02/12/21   Creig Hines, MD  ANORO ELLIPTA 62.5-25 MCG/ACT AEPB INHALE 1 PUFF INTO THE LUNGS DAILY 07/06/21   Creig Hines, MD  Budeson-Glycopyrrol-Formoterol (BREZTRI AEROSPHERE) 160-9-4.8 MCG/ACT AERO Inhale 2 puffs into the lungs in the morning and at bedtime. 08/04/21   Alinda Dooms, NP  FLOVENT HFA 220 MCG/ACT inhaler Inhale 2 puffs into the lungs 2 (two) times daily. 04/06/21   [provider]  folic acid (FOLVITE) 1 MG tablet Take 1 tablet (1 mg total) by mouth daily. 11/12/21   Creig Hines, MD  lidocaine (LIDODERM) 5 % Place 1 patch onto the skin daily. Remove & Discard patch within 12 hours or as directed by MD 12/05/20   Marrion Coy, MD  lidocaine-prilocaine (EMLA) cream Apply 1 application topically as needed. 02/12/21   Creig Hines, MD  meclizine (ANTIVERT) 25 MG tablet Take 1 tablet (25 mg total) by mouth 3 (three) times daily as needed for dizziness. 08/14/22   Jene Every, MD  naloxegol oxalate (MOVANTIK) 25 MG TABS tablet Take 1 tablet (25 mg total) by mouth daily. 01/01/21   Creig Hines, MD  potassium chloride SA (KLOR-CON M) 20 MEQ tablet TAKE 1 TABLET(20 MEQ) BY MOUTH TWICE DAILY 02/16/22   Creig Hines, MD  prochlorperazine (COMPAZINE) 10 MG tablet TAKE 1 TABLET(10 MG) BY MOUTH EVERY 6 HOURS AS NEEDED FOR NAUSEA OR VOMITING 10/22/19 11/25/19  Creig Hines, MD    Physical Exam: Vitals:   04/23/23 1902 04/23/23 1945  BP: (!) 163/105 (!) 148/101  Pulse: (!) 108 97  Resp:  (!) 28  Temp: 97.8 F (36.6 C) 97.9 F (36.6 C)  TempSrc: Oral Oral  SpO2: 99% 93%  Weight: 83.3 kg     Constitutional: NAD, no pallor or diaphoresis   Eyes: PERTLA, lids and conjunctivae normal ENMT: Mucous membranes are moist. Posterior pharynx clear of any exudate or lesions.   Neck: supple, no masses  Respiratory: Diminished on left, No wheezing. Tachypneic. Speaking full sentences.    Cardiovascular: S1 & S2 heard, regular rate and rhythm.  Pretibial pitting edema.  Abdomen: no tenderness, soft. Bowel sounds active.  Musculoskeletal: no clubbing / cyanosis. No joint deformity upper and lower extremities.   Skin: no significant rashes, lesions, ulcers. Warm, dry, well-perfused. Neurologic: CN 2-12 grossly intact. Moving all extremities. Alert and oriented.  Psychiatric: Calm. Cooperative.    Labs and Imaging on Admission: I have personally reviewed following labs and imaging studies  CBC: Recent Labs  Lab 04/23/23 1208  WBC 6.8  HGB 10.8*  HCT 36.4*  MCV 75.5*  PLT 401*   Basic Metabolic Panel: Recent Labs  Lab 04/23/23 1208  NA 142  K 3.3*  CL 108  CO2 24  GLUCOSE 174*  BUN 16  CREATININE 1.12  CALCIUM 8.9   GFR: Estimated Creatinine Clearance: 60.7 mL/min (by C-G formula based on SCr of 1.12 mg/dL). Liver Function Tests: No results for input(s): "AST", "ALT", "ALKPHOS", "BILITOT", "PROT", "ALBUMIN" in the last 168 hours. No results for input(s): "LIPASE", "AMYLASE" in the last 168 hours. No results for input(s): "  AMMONIA" in the last 168 hours. Coagulation Profile: No results for input(s): "INR", "PROTIME" in the last 168 hours. Cardiac Enzymes: No results for input(s): "CKTOTAL", "CKMB", "CKMBINDEX", "TROPONINI" in the last 168 hours. BNP (last 3 results) No results for input(s): "PROBNP" in the last 8760 hours. HbA1C: No results for input(s): "HGBA1C" in the last 72 hours. CBG: No results for input(s): "GLUCAP" in the last 168 hours. Lipid Profile: No results for input(s): "CHOL", "HDL", "LDLCALC", "TRIG", "CHOLHDL", "LDLDIRECT" in the last 72 hours. Thyroid Function Tests: No results for input(s): "TSH", "T4TOTAL", "FREET4", "T3FREE", "THYROIDAB" in the last 72 hours. Anemia Panel: No results for input(s): "VITAMINB12", "FOLATE", "FERRITIN", "TIBC", "IRON", "RETICCTPCT" in the last 72 hours. Urine analysis: No results found for: "COLORURINE", "APPEARANCEUR", "LABSPEC", "PHURINE", "GLUCOSEU",  "HGBUR", "BILIRUBINUR", "KETONESUR", "PROTEINUR", "UROBILINOGEN", "NITRITE", "LEUKOCYTESUR" Sepsis Labs: @LABRCNTIP (procalcitonin:4,lacticidven:4) )No results found for this or any previous visit (from the past 240 hours).   Radiological Exams on Admission: DG Chest 2 View Result Date: 04/23/2023 CLINICAL DATA:  Shortness of breath for 1 month.  Chest pain. EXAM: CHEST - 2 VIEW COMPARISON:  February 06, 2023. FINDINGS: Stable cardiomediastinal silhouette. Right internal jugular Port-A-Cath is unchanged. Stable moderate size loculated left pleural effusion. Hypoinflation of the lungs is noted with minimal right basilar subsegmental atelectasis. Bony thorax is unremarkable. IMPRESSION: Stable moderate size loculated left pleural effusion. Hypoinflation of the lungs with minimal right basilar subsegmental atelectasis. Electronically Signed   By: Lupita Raider M.D.   On: 04/23/2023 12:34    EKG: Independently reviewed. Sinus tachycardia, rate 108.   Assessment/Plan   1. Recurrent left pleural effusion  - Cardiothoracic surgery consulted by ED   - Continue supportive care, supplemental O2 as-needed, keep NPO after midnight   2. Lung cancer  - Hx of recurrent [name] adenocarcinoma  - No evidence for recurrent or progressive disease on recent CT - Continue follow-up with Dr. Smith Robert as planned after discharge   3. Chest pain  - Ruled-out for ACS, likely d/t pleural effusion    4. Hypokalemia  - Replacing    5. Anemia  - Appears stable with no overt bleeding    DVT prophylaxis: SCDs  Code Status: Full  Level of Care: Level of care: Progressive Family Communication: None present   Disposition Plan:  Patient is from: Home  Anticipated d/c is to: TBD Anticipated d/c date is: TBD Patient currently: Pending Cardiothoracic surgery consultation, possible VATS this admission  Consults called: Cardiothoracic surgery  Admission status: Inpatient     Briscoe Deutscher, MD [facility]  04/23/2023, 8:18 PM

## 2023-04-23 NOTE — ED Provider Notes (Signed)
 [facility] Provider Note    Event Date/Time   First MD Initiated Contact with Patient 04/23/23 1353     (approximate)   History   Shortness of Breath   HPI  Mitchell Frye is a 73 y.o. male with a history of bronchogenic carcinoma with malignant effusion status postthoracentesis with persistent effusion and chronic dyspnea recommended for VATS procedure which she has declined presents to the ER for worsening shortness of breath.  Has had imaging for his dyspnea negative for PE.  He denies any nausea or vomiting.     Physical Exam   Triage Vital Signs: ED Triage Vitals  Encounter Vitals Group     BP 04/23/23 1208 135/84     Systolic BP Percentile --      Diastolic BP Percentile --      Pulse Rate 04/23/23 1208 (!) 107     Resp 04/23/23 1208 17     Temp 04/23/23 1210 97.7 F (36.5 C)     Temp Source 04/23/23 1210 Oral     SpO2 04/23/23 1208 100 %     Weight 04/23/23 1206 170 lb (77.1 kg)     Height 04/23/23 1206 5\' 10"  (1.778 m)     Head Circumference --      Peak Flow --      Pain Score 04/23/23 1205 6     Pain Loc --      Pain Education --      Exclude from Growth Chart --     Most recent vital signs: Vitals:   04/23/23 1208 04/23/23 1210  BP: 135/84   Pulse: (!) 107   Resp: 17   Temp:  97.7 F (36.5 C)  SpO2: 100%      Constitutional: Alert, arrives in moderate respiratory distress requiring supplemental oxygen. Eyes: Conjunctivae are normal.  Head: Atraumatic. Nose: No congestion/rhinnorhea. Mouth/Throat: Mucous membranes are moist.   Neck: Painless ROM.  Cardiovascular:   Good peripheral circulation. Respiratory: Significant tachypnea speaking in 2-3 word phrases.  Diminished breath sounds on the left. Gastrointestinal: Soft and nontender.  Musculoskeletal:  no deformity Neurologic:  MAE spontaneously. No gross focal neurologic deficits are appreciated.  Skin:  Skin is warm, dry and intact. No rash noted. Psychiatric: Mood  and affect are normal. Speech and behavior are normal.    ED Results / Procedures / Treatments   Labs (all labs ordered are listed, but only abnormal results are displayed) Labs Reviewed  BASIC METABOLIC PANEL - Abnormal; Notable for the following components:      Result Value   Potassium 3.3 (*)    Glucose, Bld 174 (*)    All other components within normal limits  CBC - Abnormal; Notable for the following components:   Hemoglobin 10.8 (*)    HCT 36.4 (*)    MCV 75.5 (*)    MCH 22.4 (*)    MCHC 29.7 (*)    RDW 20.6 (*)    Platelets 401 (*)    All other components within normal limits  TROPONIN I (HIGH SENSITIVITY)  TROPONIN I (HIGH SENSITIVITY)     EKG  ED ECG REPORT I, Willy Eddy, the attending physician, personally viewed and interpreted this ECG.   Date: 04/23/2023  EKG Time: 12:07  Rate: 110  Rhythm: sinus  Axis: normal  Intervals: poor r wave progression  ST&T Change: no stemi, nonspecific st abn    RADIOLOGY Please see ED Course for my review and interpretation.  I personally  reviewed all radiographic images ordered to evaluate for the above acute complaints and reviewed radiology reports and findings.  These findings were personally discussed with the patient.  Please see medical record for radiology report.    PROCEDURES:  Critical Care performed: Yes, see critical care procedure note(s)  .Critical Care  Performed by: Willy Eddy, MD Authorized by: Willy Eddy, MD   Critical care provider statement:    Critical care time (minutes):  40   Critical care was necessary to treat or prevent imminent or life-threatening deterioration of the following conditions:  Respiratory failure   Critical care was time spent personally by me on the following activities:  Ordering and performing treatments and interventions, ordering and review of laboratory studies, ordering and review of radiographic studies, pulse oximetry, re-evaluation of  patient's condition, review of old charts, obtaining history from patient or surrogate, examination of patient, evaluation of patient's response to treatment, discussions with primary provider, discussions with consultants and development of treatment plan with patient or surrogate    MEDICATIONS ORDERED IN ED: Medications - No data to display   IMPRESSION / MDM / ASSESSMENT AND PLAN / ED COURSE  I reviewed the triage vital signs and the nursing notes.                              Differential diagnosis includes, but is not limited to, Asthma, copd, CHF, pna, ptx, malignancy, Pe, anemia  Patient presenting to the ER for evaluation of symptoms as described above.  Based on symptoms, risk factors and considered above differential, this presenting complaint could reflect a potentially life-threatening illness therefore the patient will be placed on continuous pulse oximetry and telemetry for monitoring.  Laboratory evaluation will be sent to evaluate for the above complaints.  Patient does appear significantly dyspneic.  I am recommending based on his presentation that I discussed with consultants at [facility] as he has been recommended VATS surgery at that facility.  Patient currently is stating that he does not want to be transferred because he gets claustrophobic in the ambulance and does not want to be admitted to the hospital at this time.  Fortunately patient's respiratory status does not appear amenable to outpatient follow-up at this time and informed him that that be AGAINST MEDICAL ADVICE.    Clinical Course as of 04/23/23 1510  Sun Apr 23, 2023  1423 On review of the patient's chart, have a lower suspicion for PE though remains on the differential do have a higher suspicion for large pleural effusion causing patient's worsening dyspnea and per review of chart has been recommended for VATS in the past.  After discussing our recommendations with the patient's daughter the patient is now  agreeable to transfer.  Will consult with [facility] for transfer. [PR]  1445 Discussed case in consultation with Dr. Lavinia Sharps of CT surgery who agrees to see patient in consultation.  Case discussed in consultation with hospitalist who agreed to accept patient to ground.  Patient is stable for transfer. [PR]    Clinical Course User Index [PR] Willy Eddy, MD     FINAL CLINICAL IMPRESSION(S) / ED DIAGNOSES   Final diagnoses:  Malignant pleural effusion  Dyspnea, unspecified type     Rx / DC Orders   ED Discharge Orders     None        Note:  This document was prepared using Dragon voice recognition software and may include unintentional  dictation errors.    Willy Eddy, MD 04/23/23 [phone]

## 2023-04-23 NOTE — ED Notes (Signed)
 Pts sister, Consuella Lose, called at this time to update on pts condition.

## 2023-04-23 NOTE — ED Notes (Signed)
 EMTALA Reviewed by this RN, verified transfer signature is complete.

## 2023-04-23 NOTE — ED Triage Notes (Signed)
 Patient to ED via POV for SOB. Pt states ongoing x1 month. States worse today and that he has "water around his lung and needs an operation." Also, c/o CP.

## 2023-04-24 ENCOUNTER — Inpatient Hospital Stay (HOSPITAL_COMMUNITY)

## 2023-04-24 DIAGNOSIS — I3139 Other pericardial effusion (noninflammatory): Secondary | ICD-10-CM | POA: Diagnosis not present

## 2023-04-24 DIAGNOSIS — J90 Pleural effusion, not elsewhere classified: Secondary | ICD-10-CM

## 2023-04-24 DIAGNOSIS — I314 Cardiac tamponade: Secondary | ICD-10-CM

## 2023-04-24 DIAGNOSIS — C3432 Malignant neoplasm of lower lobe, left bronchus or lung: Secondary | ICD-10-CM | POA: Diagnosis not present

## 2023-04-24 DIAGNOSIS — E876 Hypokalemia: Secondary | ICD-10-CM | POA: Diagnosis not present

## 2023-04-24 DIAGNOSIS — R0789 Other chest pain: Secondary | ICD-10-CM | POA: Diagnosis not present

## 2023-04-24 LAB — ECHOCARDIOGRAM COMPLETE
Calc EF: 43.4 %
Height: 70 in
S' Lateral: 2.6 cm
Single Plane A2C EF: 46.5 %
Single Plane A4C EF: 42.5 %
Weight: 2938.29 [oz_av]

## 2023-04-24 LAB — CBC
HCT: 32.1 % — ABNORMAL LOW (ref 39.0–52.0)
Hemoglobin: 9.4 g/dL — ABNORMAL LOW (ref 13.0–17.0)
MCH: 22.4 pg — ABNORMAL LOW (ref 26.0–34.0)
MCHC: 29.3 g/dL — ABNORMAL LOW (ref 30.0–36.0)
MCV: 76.4 fL — ABNORMAL LOW (ref 80.0–100.0)
Platelets: 363 10*3/uL (ref 150–400)
RBC: 4.2 MIL/uL — ABNORMAL LOW (ref 4.22–5.81)
RDW: 20.1 % — ABNORMAL HIGH (ref 11.5–15.5)
WBC: 6.7 10*3/uL (ref 4.0–10.5)
nRBC: 0 % (ref 0.0–0.2)

## 2023-04-24 LAB — BASIC METABOLIC PANEL
Anion gap: 9 (ref 5–15)
BUN: 13 mg/dL (ref 8–23)
CO2: 24 mmol/L (ref 22–32)
Calcium: 8.8 mg/dL — ABNORMAL LOW (ref 8.9–10.3)
Chloride: 111 mmol/L (ref 98–111)
Creatinine, Ser: 1.12 mg/dL (ref 0.61–1.24)
GFR, Estimated: >60 mL/min (ref >60)
Glucose, Bld: 108 mg/dL — ABNORMAL HIGH (ref 70–99)
Potassium: 3.7 mmol/L (ref 3.5–5.1)
Sodium: 144 mmol/L (ref 135–145)

## 2023-04-24 LAB — MAGNESIUM: Magnesium: 2.2 mg/dL (ref 1.7–2.4)

## 2023-04-24 MED ORDER — HYDROXYZINE HCL 25 MG PO TABS
25.0000 mg | ORAL_TABLET | Freq: Three times a day (TID) | ORAL | Status: DC | PRN
  Administered 2023-04-24 – 2023-05-01 (×6): 25 mg via ORAL
  Filled 2023-04-24 (×6): qty 1

## 2023-04-24 MED ORDER — LORAZEPAM 0.5 MG PO TABS
0.5000 mg | ORAL_TABLET | Freq: Four times a day (QID) | ORAL | Status: DC | PRN
  Administered 2023-04-24 – 2023-04-29 (×9): 0.5 mg via ORAL
  Filled 2023-04-24 (×9): qty 1

## 2023-04-24 MED ORDER — CLONAZEPAM 0.5 MG PO TBDP
0.5000 mg | ORAL_TABLET | Freq: Once | ORAL | Status: AC
  Administered 2023-04-24: 0.5 mg via ORAL
  Filled 2023-04-24: qty 1

## 2023-04-24 NOTE — TOC CM/SW Note (Signed)
 Transition of Care [facility]) - Inpatient Brief Assessment   Patient Details  Name: Mitchell Frye MRN: [id number] Date of Birth: November 13, 1950  Transition of Care [facility]) CM/SW Contact:    Harriet Masson, RN Phone Number: 04/24/2023, 11:50 AM   Clinical Narrative:  Patient will need VATS this admission. Time TBD.  TOC following.   Transition of Care Asessment: Insurance and Status: Insurance coverage has been reviewed Patient has primary care physician: Yes Home environment has been reviewed: safe to discharge home Prior level of function:: independent Prior/Current Home Services: No current home services Social Drivers of Health Review: SDOH reviewed no interventions necessary Readmission risk has been reviewed: Yes Transition of care needs: no transition of care needs at this time

## 2023-04-24 NOTE — H&P (View-Only) (Signed)
 [address]       [place] [address]             [phone]        [facility] E[facility] Medical Record #[id number] Date of Birth: 1950/03/22  Referring: Willy Eddy, MD Primary Care: [facility], [facility] Primary Cardiologist:None  Chief Complaint:   No chief complaint on file.   History of Present Illness:     Mr. Tritschler is a 73 year old male with a past medical history of hypertension, hyperlipidemia, stage IIIb adenocarcinoma of the lung and a recurrent left pleural effusion causing chronic dyspnea. He has been followed by Dr. Dorris Fetch for his recurrent left pleural effusion since 12/2022. He was started on chemoradiation and Durvalumab in November 2021 but this was stopped due to evidence of recurrence so he was treated with palliative radiation, carboplatin and Alimta which was stopped in December 2023. He underwent thoracentesis of bloody fluid in November 2024, cytology was negative but it was still felt it could be a malignant effusion. At that time he refused VATS and opted for another thoracentesis that only drained about 50mL of fluid and cytology was negative. During his follow up in December 2024 he felt his dyspnea had improved and was not interested in surgery although Dr. Dorris Fetch felt his best treatment options was VATS for pleural effusion drainage and decortication.CT chest on 02/06 showed a moderate left pleural effusion with complete collapse of the left lower lobe and atelectatic changes in the lingula as well as a new small right pleural effusion and small to moderate pericardial effusion.  He saw oncology on 03/02 who also recommended echocardiogram for a small to moderate pericardial effusion, this was scheduled for 05/08/23. No echocardiogram has been performed since 2022  The patient presented to the ED on 03/09 with complaints of worsening shortness of breath and right sided chest pain and pressure stating he  needed an operation. He has a chronic cough and wheezing but the patient denies fevers and chills. He does admit to dizziness especially when standing quickly but denies and LOC. He also admits to nausea with dry heaves but no vomiting. He is no longer able to perform many daily activities due to significant dyspnea but is able to dress and bathe himself. CXR on 03/09 showed a moderated loculated pleural effusion with minimal right basilar atelectasis. The patient is now agreeable to recommended VATS.   Current Activity/ Functional Status: Patient was independent with mobility/ambulation, transfers, ADL's, IADL's.   Zubrod Score: At the time of surgery this patient's most appropriate activity status/level should be described as: []     0    Normal activity, no symptoms []     1    Restricted in physical strenuous activity but ambulatory, able to do out light work [x]     2    Ambulatory and capable of self care, unable to do work activities, up and about                 more than 50%  Of the time                            []     3    Only limited self care, in bed greater than 50% of waking hours []     4    Completely disabled, no self care, confined to bed or chair []   5    Moribund  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 (HCC)     Past Surgical History:  Procedure Laterality Date   ESOPHAGOGASTRODUODENOSCOPY (EGD) WITH PROPOFOL N/A 12/01/2020   Procedure: ESOPHAGOGASTRODUODENOSCOPY (EGD) WITH PROPOFOL;  Surgeon: Toney Reil, MD;  Location: [facility] ENDOSCOPY;  Service: Gastroenterology;  Laterality: N/A;   IR THORACENTESIS ASP PLEURAL SPACE W/IMG GUIDE  12/26/2022   PORTA CATH INSERTION N/A 10/22/2019   Procedure: PORTA CATH INSERTION;  Surgeon: Renford Dills, MD;  Location: [facility] INVASIVE CV LAB;  Service: Cardiovascular;  Laterality: N/A;   VIDEO BRONCHOSCOPY WITH ENDOBRONCHIAL  ULTRASOUND N/A 09/25/2019   Procedure: VIDEO BRONCHOSCOPY WITH ENDOBRONCHIAL ULTRASOUND;  Surgeon: Salena Saner, MD;  Location: [facility] ORS;  Service: Pulmonary;  Laterality: N/A;    Social History   Tobacco Use  Smoking Status Former   Current packs/day: 0.00   Average packs/day: 1 pack/day for 20.0 years (20.0 ttl pk-yrs)   Types: Cigarettes   Start date: 63   Quit date: 69   Years since quitting: 31.2  Smokeless Tobacco Never    Social History   Substance and Sexual Activity  Alcohol Use Not Currently   Comment: not drank any beer in 2 onths     Allergies  Allergen Reactions   Taxol [Paclitaxel] Other (See Comments)    Chest pain, hip pain, coughing , wheezing    Current Facility-Administered Medications  Medication Dose Route Frequency Provider Last Rate Last Admin   acetaminophen (TYLENOL) tablet 650 mg  650 mg Oral Q6H PRN Opyd, Lavone Neri, MD       Or   acetaminophen (TYLENOL) suppository 650 mg  650 mg Rectal Q6H PRN Opyd, Lavone Neri, MD       guaiFENesin (ROBITUSSIN) 100 MG/5ML liquid 5 mL  5 mL Oral Q4H PRN Opyd, Lavone Neri, MD       ondansetron (ZOFRAN) tablet 4 mg  4 mg Oral Q6H PRN Opyd, Lavone Neri, MD       Or   ondansetron (ZOFRAN) injection 4 mg  4 mg Intravenous Q6H PRN Opyd, Lavone Neri, MD       oxyCODONE (Oxy IR/ROXICODONE) immediate release tablet 5 mg  5 mg Oral Q4H PRN Opyd, Lavone Neri, MD   5 mg at 04/24/23 0051   polyethylene glycol (MIRALAX / GLYCOLAX) packet 17 g  17 g Oral Daily PRN Opyd, Lavone Neri, MD       sodium chloride flush (NS) 0.9 % injection 3 mL  3 mL Intravenous Q12H Opyd, Lavone Neri, MD   3 mL at 04/23/23 2127    Medications Prior to Admission  Medication Sig Dispense Refill Last Dose/Taking   albuterol (VENTOLIN HFA) 108 (90 Base) MCG/ACT inhaler Inhale 2 puffs into the lungs every 4 (four) hours as needed for wheezing or shortness of breath. 8 g 1 04/23/2023   ANORO ELLIPTA 62.5-25 MCG/ACT AEPB INHALE 1 PUFF INTO THE LUNGS DAILY  (Patient taking differently: Inhale 1 puff into the lungs daily as needed (SOB).) 60 each 2 Past Week   Budeson-Glycopyrrol-Formoterol (BREZTRI AEROSPHERE) 160-9-4.8 MCG/ACT AERO Inhale 2 puffs into the lungs in the morning and at bedtime. (Patient not taking: Reported on 04/24/2023) 10.7 g 5 Not Taking   FLOVENT HFA 220 MCG/ACT inhaler Inhale 2 puffs into the lungs 2 (two) times daily. (Patient not taking: Reported on 04/24/2023)  Not Taking    Family History  Problem Relation Age of Onset   Cancer Brother      Review of Systems:   Review of Systems  Constitutional:  Positive for malaise/fatigue. Negative for chills, fever and weight loss.  HENT:  Negative for hearing loss.   Eyes:  Negative for blurred vision.  Respiratory:  Positive for cough, sputum production, shortness of breath and wheezing. Negative for hemoptysis.   Cardiovascular:  Positive for chest pain and orthopnea. Negative for palpitations and leg swelling.       Right sided chest pain  Gastrointestinal:  Positive for heartburn and nausea. Negative for vomiting.  Genitourinary:  Negative for dysuria.  Neurological:  Positive for dizziness. Negative for loss of consciousness.  Endo/Heme/Allergies:  Does not bruise/bleed easily.  Dental: Has dentures, tries to see the dentist yearly    Physical Exam: BP (!) 148/96 (BP Location: Left Arm)   Pulse 89   Temp 98.3 F (36.8 C) (Oral)   Resp 20   Wt 83.3 kg   SpO2 100%   BMI 26.35 kg/m  General appearance: alert, cooperative, and no distress Head: Normocephalic, without obvious abnormality, atraumatic Neck: no adenopathy, no carotid bruit, no JVD, supple, symmetrical, trachea midline, and thyroid not enlarged, symmetric, no tenderness/mass/nodules Lymph nodes: Cervical, supraclavicular, and axillary nodes normal. Resp: diminished bibasilar breath sounds L>R, wheezes throughout Cardio: regular rate and rhythm, S1, S2 normal, no murmur, click, rub or gallop GI: soft,  non-tender; bowel sounds normal; no masses,  no organomegaly Extremities: extremities normal, atraumatic, no cyanosis or edema Neurologic: Grossly normal  Diagnostic Studies & Radiology Findings: CLINICAL DATA:  Shortness of breath for 1 month.  Chest pain.   EXAM: CHEST - 2 VIEW   COMPARISON:  February 06, 2023.   FINDINGS: Stable cardiomediastinal silhouette. Right internal jugular Port-A-Cath is unchanged. Stable moderate size loculated left pleural effusion. Hypoinflation of the lungs is noted with minimal right basilar subsegmental atelectasis. Bony thorax is unremarkable.   IMPRESSION: Stable moderate size loculated left pleural effusion. Hypoinflation of the lungs with minimal right basilar subsegmental atelectasis.     Electronically Signed   By: Lupita Raider M.D.   On: 04/23/2023 12:34   CLINICAL DATA:  Malignant neoplasm of lower lobe of left lung. * Tracking Code: BO *   EXAM: CT CHEST WITHOUT CONTRAST   TECHNIQUE: Multidetector CT imaging of the chest was performed following the standard protocol without IV contrast.   RADIATION DOSE REDUCTION: This exam was performed according to the departmental dose-optimization program which includes automated exposure control, adjustment of the mA and/or kV according to patient size and/or use of iterative reconstruction technique.   COMPARISON:  CT scan chest from 11/11/2022 and PET-CT scan from 12/12/2022.   FINDINGS: Cardiovascular: Normal cardiac size. There is new small-to-moderate pericardial effusion. No aortic aneurysm.   Mediastinum/Nodes: Visualized thyroid gland appears grossly unremarkable. No solid / cystic mediastinal masses. The esophagus is nondistended precluding optimal assessment. Small mediastinal lymph nodes (series 2, image 44), exhibited mild FDG avidity on the prior PET-CT scan. No interval change in the size. No other mediastinal or axillary lymphadenopathy by size criteria. Evaluation  of bilateral hila is limited due to lack on intravenous contrast: however, no large hilar lymphadenopathy identified.   Lungs/Pleura: The central tracheo-bronchial tree is patent. Redemonstration of moderate left pleural effusion without significant interval change since the prior study. There is associated complete collapse of the left lung lower lobe and subsegmental atelectatic changes in  the lingula, also essentially unchanged since the prior study. There is small right pleural effusion, new since the prior study. There is a subcentimeter sized nodule along the minor fissure (series 7, image 40), which is not well evaluated due to extensive motion at this level however, is grossly similar to the prior study and may represent intra fissural lymph node. No new or suspicious lung nodule seen. Remaining bilateral lungs are otherwise unremarkable.   Upper Abdomen: Subtle hypoattenuating lesion in the left kidney upper pole was characterized as a cyst on the prior exam. Remaining visualized upper abdominal viscera within normal limits.   Musculoskeletal: A CT Port-a-Cath is seen in the right upper chest wall with the catheter terminating in the cavo-atrial junction region. Visualized soft tissues of the chest wall are otherwise grossly unremarkable. No suspicious osseous lesions. There are mild multilevel degenerative changes in the visualized spine.   IMPRESSION: 1. Redemonstration of moderate left pleural effusion with associated complete collapse of the left lung lower lobe and subsegmental atelectatic changes in the lingula, essentially unchanged since the prior study. 2. There is a new small right pleural effusion. 3. There is a new small-to-moderate pericardial effusion. 4. No new or suspicious lung nodule seen. 5. Multiple other nonacute observations, as described above.     Electronically Signed   By: Jules Schick M.D.   On: 04/06/2023 19:02  Assessment &  Plan: Chronic left pleural effusion: Dr. Dorris Fetch has been following this patient since November 2024 and he has had multiple unsuccessful thoracenteses. Recommended VATS with drainage of pleural effusion and decortication. Patient initially did not want surgery but is now agreeable to surgery. Last chest CT was 04/06/23, will get updated CT chest. Dr. Dorris Fetch to ultimately determine timing of surgery.  Small to moderate pericardial effusion: Present on the last chest CT on 04/06/23, echocardiogram was scheduled for 05/08/23. Will get echocardiogram.  Stage 3b adenocarcinoma of the lung: Has undergone chemoradiation and immunotherapy as well as palliative radiation. This has all been discontinued, no longer on treatment.  HTN: Not on medication HLD: Not on medication  Jenny Reichmann, PA-C 04/24/23

## 2023-04-24 NOTE — Progress Notes (Signed)
 Mobility Specialist Progress Note;   04/24/23 1125  Mobility  Activity Stood at bedside  Level of Assistance Contact guard assist, steadying assist  Assistive Device None  Activity Response Tolerated fair  Mobility Referral Yes  Mobility visit 1 Mobility  Mobility Specialist Start Time (ACUTE ONLY) 1125  Mobility Specialist Stop Time (ACUTE ONLY) 1140  Mobility Specialist Time Calculation (min) (ACUTE ONLY) 15 min   Pt agreeable to sit up in recliner for lunch. On 2LO2 upon arrival. Required MinG assistance to stand from EOB. Upon standing pt began coughing and wheezing, requested to sit back down. SPO2 95%> throughout. Attempted standing w/ pt again, however pt sill continued to cough and wheeze. Requested to return back to bed. Pt left comfortably in bed with all needs met, bed alarm on. RN notified.   Mitchell Frye Mobility Specialist Please contact via SecureChat or [employer] [phone]

## 2023-04-24 NOTE — Progress Notes (Signed)
 Echocardiogram 2D Echocardiogram has been performed.  Warren Lacy Dawnetta Copenhaver RDCS 04/24/2023, 2:48 PM  Notified Drs Gala Romney and Shirlee Latch

## 2023-04-24 NOTE — Progress Notes (Signed)
 PROGRESS NOTE    Mitchell Frye  BJY:[id number] DOB: 1951-01-17 DOA: 04/23/2023 PCP: [facility]   Brief Narrative:  Patient is a 73 year old AAM has medical history significant for Mannam to hypertension, hyperlipidemia, non-small cell lung cancer, recurrent pleural effusion on the left as well as the comorbidities who presented with chest pain or shortness breath.  He has been complaining of chest pain shortness breath has been present for months and is slowly worsening.  He denies improved form a activities of daily living without significant dyspnea and he has a chronic cough which has not really changed.  He was diagnosed with stage IIIb lung cancer and was seen by cardiothoracic surgery and is felt that additional thoracentesis for his pleural effusions would not be beneficial and advanced drainage of the effusion and decortication of the lung was recommended.  Initially refused to undergo this procedure but now since has changed his mind presented back to the ED and was transferred from [facility].  Chest x-ray revealed a moderate size loculated pleural left-sided effusion.  CT VS was consulted and they are recommending repeating a CT of his chest without contrast and echocardiogram.  He has since been admitted and being treated:  Assessment and Plan:  Recurrent left pleural effusion:  Cardiothoracic surgery consulted by ED and requested a repeat echocardiogram as well as a CT of the chest without contrast. Continue supportive care, supplemental O2 as-needed, keep NPO after midnight on Tuesday night.  CT of the chest is pending but echocardiogram done and showed an EF of 55 to 60% with left ventricular normal function and global hypokinesis mild asymmetric left ventricular hypertrophy and left ventricular diastolic parameters are indeterminate.  To Dr. Dorris Fetch to evaluate in the morning and patient to be undergoing a VATS with drainage on Wednesday, 04/26/2023.  Pericardial  effusion with cardiac tamponade: Used to be small to moderate but now on repeat echo was found to have a circumferential large pericardial effusion with significant respiratory variation both mitral and tricuspid inflow with evidence of right ventricular diastolic collapse consistent with early tamponade physiology.  Cardiothoracic surgery is involved and cardiology will consult unless patient becomes hemodynamically unstable.  Need to consult cardiology ASAP if he does become hemodynamically unstable for pericardial centesis..  Lung cancer: Hx of recurrent stage IIIb adenocarcinoma. No evidence for recurrent or progressive disease on recent CT. Continue follow-up with Dr. Smith Robert as planned after discharge    Chest Pain: Ruled-out for ACS, likely d/t pleural effusion and Cardiac Tamponade. Troponn went from 8 -> 9   Hypokalemia: K+ went from 3.3 -> 3.7. CTM and Trend and repeat CMP in the AM    Microcytic Anemia: Appears stable with no overt bleeding but hemoglobin/hematocrit has dropped from 10.8/36.4 is now 9.4/32.1.  MCV is 76.4.  Continue monitor for S/Sx of Bleeding; No overt bleeding noted. Repeat CBC in the AM  Hyperglycemia: Blood Sugar elevated on Daily BMP. Check HbA1c.   Anxiety: C/w Hydroxyzine 25 mg po TIDprn and Lorazepam 0.5 mg po q6hprn   DVT prophylaxis: SCDs Start: 04/23/23 2004    Code Status: Full Code Family Communication: No family present at bedside   Disposition Plan:  Level of care: Progressive Status is: Inpatient Remains inpatient appropriate because: His further clinical improvement clearance by cardiothoracic surgery.  Anticipating patient undergoing a VATS with drainage on 04/26/2023  Consultants:  Cardiothoracic surgery Discussed with cardiology  Procedures:  As delineated as above  Antimicrobials:  Anti-infectives (From admission, onward)  None       Subjective: Seen and examined at bedside and felt extremely short of breath so she went to  ambulate.  No nausea or vomiting.  Having some intermittent chest pain.  No other concerns or complaints this time.  Objective: Vitals:   04/24/23 1157 04/24/23 1215 04/24/23 1631 04/24/23 1941  BP: (!) 134/98  (!) 149/102 (!) 150/105  Pulse: 89  99 (!) 105  Resp: 20  20 20   Temp: 97.6 F (36.4 C)  98.1 F (36.7 C) (!) 97.2 F (36.2 C)  TempSrc: Oral  Oral Oral  SpO2: 100%  99% 95%  Weight:      Height:  5\' 10"  (1.778 m)      Intake/Output Summary (Last 24 hours) at 04/24/2023 2121 Last data filed at 04/24/2023 1941 Gross per 24 hour  Intake 483 ml  Output 500 ml  Net -17 ml   Filed Weights   04/23/23 1902  Weight: 83.3 kg   Examination: Physical Exam:  Constitutional: African-American male who is chronically ill-appearing and appears a little dyspneic Respiratory: Diminished to auscultation bilaterally with some coarse breath sounds more so on the left compared to the right, no wheezing, rales, rhonchi or crackles. Normal respiratory effort and patient is not tachypenic. No accessory muscle use.  Cardiovascular: RRR, no murmurs / rubs / gallops. S1 and S2 auscultated.  Has some slight pitting edema Abdomen: Soft, non-tender, slightly distended secondary to body habitus.  Bowel sounds positive.  GU: Deferred. Musculoskeletal: No clubbing / cyanosis of digits/nails. No joint deformity upper and lower extremities. Skin: No rashes, lesions, ulcers on limited skin evaluation. No induration; Warm and dry.  Neurologic: CN 2-12 grossly intact with no focal deficits.  Romberg sign cerebellar reflexes not assessed.  Psychiatric: Appears a little anxious but is awake and alert.  Data Reviewed: I have personally reviewed following labs and imaging studies  CBC: Recent Labs  Lab 04/23/23 1208 04/24/23 0155  WBC 6.8 6.7  HGB 10.8* 9.4*  HCT 36.4* 32.1*  MCV 75.5* 76.4*  PLT 401* 363   Basic Metabolic Panel: Recent Labs  Lab 04/23/23 1208 04/24/23 0155  NA 142 144  K 3.3*  3.7  CL 108 111  CO2 24 24  GLUCOSE 174* 108*  BUN 16 13  CREATININE 1.12 1.12  CALCIUM 8.9 8.8*  MG  --  2.2   GFR: Estimated Creatinine Clearance: 60.7 mL/min (by C-G formula based on SCr of 1.12 mg/dL). Liver Function Tests: No results for input(s): "AST", "ALT", "ALKPHOS", "BILITOT", "PROT", "ALBUMIN" in the last 168 hours. No results for input(s): "LIPASE", "AMYLASE" in the last 168 hours. No results for input(s): "AMMONIA" in the last 168 hours. Coagulation Profile: No results for input(s): "INR", "PROTIME" in the last 168 hours. Cardiac Enzymes: No results for input(s): "CKTOTAL", "CKMB", "CKMBINDEX", "TROPONINI" in the last 168 hours. BNP (last 3 results) No results for input(s): "PROBNP" in the last 8760 hours. HbA1C: No results for input(s): "HGBA1C" in the last 72 hours. CBG: No results for input(s): "GLUCAP" in the last 168 hours. Lipid Profile: No results for input(s): "CHOL", "HDL", "LDLCALC", "TRIG", "CHOLHDL", "LDLDIRECT" in the last 72 hours. Thyroid Function Tests: No results for input(s): "TSH", "T4TOTAL", "FREET4", "T3FREE", "THYROIDAB" in the last 72 hours. Anemia Panel: No results for input(s): "VITAMINB12", "FOLATE", "FERRITIN", "TIBC", "IRON", "RETICCTPCT" in the last 72 hours. Sepsis Labs: No results for input(s): "PROCALCITON", "LATICACIDVEN" in the last 168 hours.  No results found for this or any previous  visit (from the past 240 hours).   Radiology Studies: ECHOCARDIOGRAM COMPLETE Result Date: 04/24/2023    ECHOCARDIOGRAM REPORT   Patient Name:   HUNNER GARCON Date of Exam: 04/24/2023 Medical Rec #:  [id number]    Height:       70.0 in Accession #:    [id number]   Weight:       183.6 lb Date of Birth:  11-22-1950     BSA:          2.013 m Patient Age:    72 years     BP:           134/98 mmHg Patient Gender: M            HR:           109 bpm. Exam Location:  Inpatient Procedure: 2D Echo, Color Doppler and Cardiac Doppler (Both Spectral and Color             Flow Doppler were utilized during procedure). Indications:    I31.3 Pericardial effusion (noninflammatory)  History:        Patient has prior history of Echocardiogram examinations, most                 recent 11/11/2020. Risk Factors:Hypertension and Dyslipidemia.                 Left Lung Cancer.  Sonographer:    Irving Burton Senior RDCS Sonographer#2:  Rosaland Lao Referring Phys: [id number] Kateri Mc LATIF Kassim Guertin IMPRESSIONS  1. Left ventricular ejection fraction, by estimation, is 55 to 60%. Left ventricular ejection fraction by 2D MOD biplane is 43.4 %. The left ventricle has normal function. The left ventricle demonstrates global hypokinesis. There is mild asymmetric left  ventricular hypertrophy of the septal segment. Left ventricular diastolic parameters are indeterminate.  2. Right ventricular systolic function is normal. The right ventricular size is normal. Tricuspid regurgitation signal is inadequate for assessing PA pressure.  3. Significant respiratory variation in both mitral and tricuspid inflows, evidence of RV diastolic collapse, IVC is slightly collapsible but dilated consistent with early tamponade physiology. Large pericardial effusion. The pericardial effusion is circumferential. Findings are consistent with cardiac tamponade.  4. The mitral valve is normal in structure. Trivial mitral valve regurgitation.  5. The aortic valve is tricuspid. Aortic valve regurgitation is mild to moderate.  6. Aortic dilatation noted. There is mild dilatation of the aortic root, measuring 42 mm.  7. The inferior vena cava is dilated in size with <50% respiratory variability, suggesting right atrial pressure of 15 mmHg. Conclusion(s)/Recommendation(s): Large circumfrential pericardial effusion with evidenece of tamponade physiology. Ordering team will be notified. FINDINGS  Left Ventricle: Left ventricular ejection fraction, by estimation, is 55 to 60%. Left ventricular ejection fraction by 2D MOD biplane is 43.4 %. The  left ventricle has normal function. The left ventricle demonstrates global hypokinesis. The left ventricular internal cavity size was normal in size. There is mild asymmetric left ventricular hypertrophy of the septal segment. Left ventricular diastolic parameters are indeterminate. Right Ventricle: The right ventricular size is normal. No increase in right ventricular wall thickness. Right ventricular systolic function is normal. Tricuspid regurgitation signal is inadequate for assessing PA pressure. Left Atrium: Left atrial size was normal in size. Right Atrium: Right atrial size was normal in size. Pericardium: Significant respiratory variation in both mitral and tricuspid inflows, evidence of RV diastolic collapse, IVC is slightly collapsible but dilated consistent with early tamponade physiology. A large pericardial effusion is present. The pericardial  effusion is circumferential. There is evidence of cardiac tamponade. Thickening/calcification of pericardium present. Mitral Valve: The mitral valve is normal in structure. Trivial mitral valve regurgitation. Tricuspid Valve: The tricuspid valve is normal in structure. Tricuspid valve regurgitation is trivial. Aortic Valve: The aortic valve is tricuspid. Aortic valve regurgitation is mild to moderate. Pulmonic Valve: The pulmonic valve was normal in structure. Pulmonic valve regurgitation is trivial. Aorta: The aortic root and ascending aorta are structurally normal, with no evidence of dilitation and aortic dilatation noted. There is mild dilatation of the aortic root, measuring 42 mm. Venous: The inferior vena cava is dilated in size with less than 50% respiratory variability, suggesting right atrial pressure of 15 mmHg. The inferior vena cava and the hepatic vein show a systolic blunting flow pattern. IAS/Shunts: No atrial level shunt detected by color flow Doppler. Additional Comments: There is pleural effusion in the left lateral region.  LEFT VENTRICLE PLAX  2D                        Biplane EF (MOD) LVIDd:         4.10 cm         LV Biplane EF:   Left LVIDs:         2.60 cm                          ventricular LV PW:         0.90 cm                          ejection LV IVS:        1.10 cm                          fraction by LVOT diam:     2.20 cm                          2D MOD LV SV:         42                               biplane is LV SV Index:   21                               43.4 %. LVOT Area:     3.80 cm  LV Volumes (MOD) LV vol d, MOD    75.3 ml A2C: LV vol d, MOD    56.9 ml A4C: LV vol s, MOD    40.3 ml A2C: LV vol s, MOD    32.7 ml A4C: LV SV MOD A2C:   35.0 ml LV SV MOD A4C:   56.9 ml LV SV MOD BP:    28.6 ml RIGHT VENTRICLE RV S prime:     16.00 cm/s TAPSE (M-mode): 1.8 cm LEFT ATRIUM             Index        RIGHT ATRIUM           Index LA diam:        3.40 cm 1.69 cm/m   RA Area:     12.30 cm LA Vol (A2C):   61.9 ml 30.75 ml/m  RA Volume:   26.90 ml  13.36 ml/m LA Vol (A4C):   18.3 ml 9.09 ml/m LA Biplane Vol: 33.2 ml 16.49 ml/m  AORTIC VALVE LVOT Vmax:   74.40 cm/s LVOT Vmean:  55.033 cm/s LVOT VTI:    0.111 m  AORTA Ao Root diam: 4.20 cm Ao Asc diam:  3.70 cm  SHUNTS Systemic VTI:  0.11 m Systemic Diam: 2.20 cm Arvilla Meres MD Electronically signed by Arvilla Meres MD Signature Date/Time: 04/24/2023/2:53:34 PM    Final    DG Chest 2 View Result Date: 04/23/2023 CLINICAL DATA:  Shortness of breath for 1 month.  Chest pain. EXAM: CHEST - 2 VIEW COMPARISON:  February 06, 2023. FINDINGS: Stable cardiomediastinal silhouette. Right internal jugular Port-A-Cath is unchanged. Stable moderate size loculated left pleural effusion. Hypoinflation of the lungs is noted with minimal right basilar subsegmental atelectasis. Bony thorax is unremarkable. IMPRESSION: Stable moderate size loculated left pleural effusion. Hypoinflation of the lungs with minimal right basilar subsegmental atelectasis. Electronically Signed   By: Lupita Raider M.D.   On:  04/23/2023 12:34   Scheduled Meds:  sodium chloride flush  3 mL Intravenous Q12H   Continuous Infusions:   LOS: 1 day   Marguerita Merles, DO [facility] Available via Epic secure chat 7am-7pm After these hours, please refer to coverage provider listed on [email or web address] 04/24/2023, 9:21 PM

## 2023-04-24 NOTE — Consult Note (Signed)
 [address]       [place] [address]             [phone]        Mitchell Frye Date of Birth: 1950/03/22  Referring: Willy Eddy, MD Primary Care: [facility] Primary Cardiologist:None  Chief Complaint:   No chief complaint on file.   History of Present Illness:     Mitchell Frye is a 73 year old male with a past medical history of hypertension, hyperlipidemia, stage IIIb adenocarcinoma of the lung and a recurrent left pleural effusion causing chronic dyspnea. He has been followed by Dr. Dorris Fetch for his recurrent left pleural effusion since 12/2022. He was started on chemoradiation and Durvalumab in November 2021 but this was stopped due to evidence of recurrence so he was treated with palliative radiation, carboplatin and Alimta which was stopped in December 2023. He underwent thoracentesis of bloody fluid in November 2024, cytology was negative but it was still felt it could be a malignant effusion. At that time he refused VATS and opted for another thoracentesis that only drained about 50mL of fluid and cytology was negative. During his follow up in December 2024 he felt his dyspnea had improved and was not interested in surgery although Dr. Dorris Fetch felt his best treatment options was VATS for pleural effusion drainage and decortication.CT chest on 02/06 showed a moderate left pleural effusion with complete collapse of the left lower lobe and atelectatic changes in the lingula as well as a new small right pleural effusion and small to moderate pericardial effusion.  He saw oncology on 03/02 who also recommended echocardiogram for a small to moderate pericardial effusion, this was scheduled for 05/08/23. No echocardiogram has been performed since 2022  The patient presented to the ED on 03/09 with complaints of worsening shortness of breath and right sided chest pain and pressure stating he  needed an operation. He has a chronic cough and wheezing but the patient denies fevers and chills. He does admit to dizziness especially when standing quickly but denies and LOC. He also admits to nausea with dry heaves but no vomiting. He is no longer able to perform many daily activities due to significant dyspnea but is able to dress and bathe himself. CXR on 03/09 showed a moderated loculated pleural effusion with minimal right basilar atelectasis. The patient is now agreeable to recommended VATS.   Current Activity/ Functional Status: Patient was independent with mobility/ambulation, transfers, ADL's, IADL's.   Zubrod Score: At the time of surgery this patient's most appropriate activity status/level should be described as: []     0    Normal activity, no symptoms []     1    Restricted in physical strenuous activity but ambulatory, able to do out light work [x]     2    Ambulatory and capable of self care, unable to do work activities, up and about                 more than 50%  Of the time                            []     3    Only limited self care, in bed greater than 50% of waking hours []     4    Completely disabled, no self care, confined to bed or chair []   5    Moribund  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 (HCC)     Past Surgical History:  Procedure Laterality Date   ESOPHAGOGASTRODUODENOSCOPY (EGD) WITH PROPOFOL N/A 12/01/2020   Procedure: ESOPHAGOGASTRODUODENOSCOPY (EGD) WITH PROPOFOL;  Surgeon: Toney Reil, MD;  Location: [facility] ENDOSCOPY;  Service: Gastroenterology;  Laterality: N/A;   IR THORACENTESIS ASP PLEURAL SPACE W/IMG GUIDE  12/26/2022   PORTA CATH INSERTION N/A 10/22/2019   Procedure: PORTA CATH INSERTION;  Surgeon: Renford Dills, MD;  Location: [facility] INVASIVE CV LAB;  Service: Cardiovascular;  Laterality: N/A;   VIDEO BRONCHOSCOPY WITH ENDOBRONCHIAL  ULTRASOUND N/A 09/25/2019   Procedure: VIDEO BRONCHOSCOPY WITH ENDOBRONCHIAL ULTRASOUND;  Surgeon: Salena Saner, MD;  Location: [facility] ORS;  Service: Pulmonary;  Laterality: N/A;    Social History   Tobacco Use  Smoking Status Former   Current packs/day: 0.00   Average packs/day: 1 pack/day for 20.0 years (20.0 ttl pk-yrs)   Types: Cigarettes   Start date: 63   Quit date: 69   Years since quitting: 31.2  Smokeless Tobacco Never    Social History   Substance and Sexual Activity  Alcohol Use Not Currently   Comment: not drank any beer in 2 onths     Allergies  Allergen Reactions   Taxol [Paclitaxel] Other (See Comments)    Chest pain, hip pain, coughing , wheezing    Current Facility-Administered Medications  Medication Dose Route Frequency Provider Last Rate Last Admin   acetaminophen (TYLENOL) tablet 650 mg  650 mg Oral Q6H PRN Opyd, Lavone Neri, MD       Or   acetaminophen (TYLENOL) suppository 650 mg  650 mg Rectal Q6H PRN Opyd, Lavone Neri, MD       guaiFENesin (ROBITUSSIN) 100 MG/5ML liquid 5 mL  5 mL Oral Q4H PRN Opyd, Lavone Neri, MD       ondansetron (ZOFRAN) tablet 4 mg  4 mg Oral Q6H PRN Opyd, Lavone Neri, MD       Or   ondansetron (ZOFRAN) injection 4 mg  4 mg Intravenous Q6H PRN Opyd, Lavone Neri, MD       oxyCODONE (Oxy IR/ROXICODONE) immediate release tablet 5 mg  5 mg Oral Q4H PRN Opyd, Lavone Neri, MD   5 mg at 04/24/23 0051   polyethylene glycol (MIRALAX / GLYCOLAX) packet 17 g  17 g Oral Daily PRN Opyd, Lavone Neri, MD       sodium chloride flush (NS) 0.9 % injection 3 mL  3 mL Intravenous Q12H Opyd, Lavone Neri, MD   3 mL at 04/23/23 2127    Medications Prior to Admission  Medication Sig Dispense Refill Last Dose/Taking   albuterol (VENTOLIN HFA) 108 (90 Base) MCG/ACT inhaler Inhale 2 puffs into the lungs every 4 (four) hours as needed for wheezing or shortness of breath. 8 g 1 04/23/2023   ANORO ELLIPTA 62.5-25 MCG/ACT AEPB INHALE 1 PUFF INTO THE LUNGS DAILY  (Patient taking differently: Inhale 1 puff into the lungs daily as needed (SOB).) 60 each 2 Past Week   Budeson-Glycopyrrol-Formoterol (BREZTRI AEROSPHERE) 160-9-4.8 MCG/ACT AERO Inhale 2 puffs into the lungs in the morning and at bedtime. (Patient not taking: Reported on 04/24/2023) 10.7 g 5 Not Taking   FLOVENT HFA 220 MCG/ACT inhaler Inhale 2 puffs into the lungs 2 (two) times daily. (Patient not taking: Reported on 04/24/2023)  Not Taking    Family History  Problem Relation Age of Onset   Cancer Brother      Review of Systems:   Review of Systems  Constitutional:  Positive for malaise/fatigue. Negative for chills, fever and weight loss.  HENT:  Negative for hearing loss.   Eyes:  Negative for blurred vision.  Respiratory:  Positive for cough, sputum production, shortness of breath and wheezing. Negative for hemoptysis.   Cardiovascular:  Positive for chest pain and orthopnea. Negative for palpitations and leg swelling.       Right sided chest pain  Gastrointestinal:  Positive for heartburn and nausea. Negative for vomiting.  Genitourinary:  Negative for dysuria.  Neurological:  Positive for dizziness. Negative for loss of consciousness.  Endo/Heme/Allergies:  Does not bruise/bleed easily.  Dental: Has dentures, tries to see the dentist yearly    Physical Exam: BP (!) 148/96 (BP Location: Left Arm)   Pulse 89   Temp 98.3 F (36.8 C) (Oral)   Resp 20   Wt 83.3 kg   SpO2 100%   BMI 26.35 kg/m  General appearance: alert, cooperative, and no distress Head: Normocephalic, without obvious abnormality, atraumatic Neck: no adenopathy, no carotid bruit, no JVD, supple, symmetrical, trachea midline, and thyroid not enlarged, symmetric, no tenderness/mass/nodules Lymph nodes: Cervical, supraclavicular, and axillary nodes normal. Resp: diminished bibasilar breath sounds L>R, wheezes throughout Cardio: regular rate and rhythm, S1, S2 normal, no murmur, click, rub or gallop GI: soft,  non-tender; bowel sounds normal; no masses,  no organomegaly Extremities: extremities normal, atraumatic, no cyanosis or edema Neurologic: Grossly normal  Diagnostic Studies & Radiology Findings: CLINICAL DATA:  Shortness of breath for 1 month.  Chest pain.   EXAM: CHEST - 2 VIEW   COMPARISON:  February 06, 2023.   FINDINGS: Stable cardiomediastinal silhouette. Right internal jugular Port-A-Cath is unchanged. Stable moderate size loculated left pleural effusion. Hypoinflation of the lungs is noted with minimal right basilar subsegmental atelectasis. Bony thorax is unremarkable.   IMPRESSION: Stable moderate size loculated left pleural effusion. Hypoinflation of the lungs with minimal right basilar subsegmental atelectasis.     Electronically Signed   By: Lupita Raider M.D.   On: 04/23/2023 12:34   CLINICAL DATA:  Malignant neoplasm of lower lobe of left lung. * Tracking Code: BO *   EXAM: CT CHEST WITHOUT CONTRAST   TECHNIQUE: Multidetector CT imaging of the chest was performed following the standard protocol without IV contrast.   RADIATION DOSE REDUCTION: This exam was performed according to the departmental dose-optimization program which includes automated exposure control, adjustment of the mA and/or kV according to patient size and/or use of iterative reconstruction technique.   COMPARISON:  CT scan chest from 11/11/2022 and PET-CT scan from 12/12/2022.   FINDINGS: Cardiovascular: Normal cardiac size. There is new small-to-moderate pericardial effusion. No aortic aneurysm.   Mediastinum/Nodes: Visualized thyroid gland appears grossly unremarkable. No solid / cystic mediastinal masses. The esophagus is nondistended precluding optimal assessment. Small mediastinal lymph nodes (series 2, image 44), exhibited mild FDG avidity on the prior PET-CT scan. No interval change in the size. No other mediastinal or axillary lymphadenopathy by size criteria. Evaluation  of bilateral hila is limited due to lack on intravenous contrast: however, no large hilar lymphadenopathy identified.   Lungs/Pleura: The central tracheo-bronchial tree is patent. Redemonstration of moderate left pleural effusion without significant interval change since the prior study. There is associated complete collapse of the left lung lower lobe and subsegmental atelectatic changes in  the lingula, also essentially unchanged since the prior study. There is small right pleural effusion, new since the prior study. There is a subcentimeter sized nodule along the minor fissure (series 7, image 40), which is not well evaluated due to extensive motion at this level however, is grossly similar to the prior study and may represent intra fissural lymph node. No new or suspicious lung nodule seen. Remaining bilateral lungs are otherwise unremarkable.   Upper Abdomen: Subtle hypoattenuating lesion in the left kidney upper pole was characterized as a cyst on the prior exam. Remaining visualized upper abdominal viscera within normal limits.   Musculoskeletal: A CT Port-a-Cath is seen in the right upper chest wall with the catheter terminating in the cavo-atrial junction region. Visualized soft tissues of the chest wall are otherwise grossly unremarkable. No suspicious osseous lesions. There are mild multilevel degenerative changes in the visualized spine.   IMPRESSION: 1. Redemonstration of moderate left pleural effusion with associated complete collapse of the left lung lower lobe and subsegmental atelectatic changes in the lingula, essentially unchanged since the prior study. 2. There is a new small right pleural effusion. 3. There is a new small-to-moderate pericardial effusion. 4. No new or suspicious lung nodule seen. 5. Multiple other nonacute observations, as described above.     Electronically Signed   By: Jules Schick M.D.   On: 04/06/2023 19:02  Assessment &  Plan: Chronic left pleural effusion: Dr. Dorris Fetch has been following this patient since November 2024 and he has had multiple unsuccessful thoracenteses. Recommended VATS with drainage of pleural effusion and decortication. Patient initially did not want surgery but is now agreeable to surgery. Last chest CT was 04/06/23, will get updated CT chest. Dr. Dorris Fetch to ultimately determine timing of surgery.  Small to moderate pericardial effusion: Present on the last chest CT on 04/06/23, echocardiogram was scheduled for 05/08/23. Will get echocardiogram.  Stage 3b adenocarcinoma of the lung: Has undergone chemoradiation and immunotherapy as well as palliative radiation. This has all been discontinued, no longer on treatment.  HTN: Not on medication HLD: Not on medication  Mitchell Reichmann, PA-C 04/24/23

## 2023-04-24 NOTE — Progress Notes (Signed)
 Dr. Milinda Antis and Dr. Margo Aye made aware of the result of CT chest and BP.

## 2023-04-24 NOTE — Plan of Care (Signed)
  Problem: Clinical Measurements: Goal: Will remain free from infection Outcome: Progressing   Problem: Activity: Goal: Risk for activity intolerance will decrease Outcome: Progressing   Problem: Safety: Goal: Ability to remain free from injury will improve Outcome: Progressing   

## 2023-04-24 NOTE — Hospital Course (Signed)
 Patient is a 73 year old AAM has medical history significant for Mannam to hypertension, hyperlipidemia, non-small cell lung cancer, recurrent pleural effusion on the left as well as the comorbidities who presented with chest pain or shortness breath.  He has been complaining of chest pain shortness breath has been present for months and is slowly worsening.  He denies improved form a activities of daily living without significant dyspnea and he has a chronic cough which has not really changed.  He was diagnosed with stage IIIb lung cancer and was seen by cardiothoracic surgery and is felt that additional thoracentesis for his pleural effusions would not be beneficial and advanced drainage of the effusion and decortication of the lung was recommended.  Initially refused to undergo this procedure but now since has changed his mind presented back to the ED and was transferred from [facility].  Chest x-ray revealed a moderate size loculated pleural left-sided effusion.  CT VS was consulted and they are recommending repeating a CT of his chest without contrast and echocardiogram.  He has since been admitted and being treated:  Assessment and Plan:  Recurrent left pleural effusion:  Cardiothoracic surgery consulted by ED and requested a repeat echocardiogram as well as a CT of the chest without contrast. Continue supportive care, supplemental O2 as-needed, keep NPO after midnight on Tuesday night.  CT of the chest is pending but echocardiogram done and showed an EF of 55 to 60% with left ventricular normal function and global hypokinesis mild asymmetric left ventricular hypertrophy and left ventricular diastolic parameters are indeterminate.  To Dr. Dorris Fetch to evaluate in the morning and patient to be undergoing a VATS with drainage on Wednesday, 04/26/2023.  Pericardial effusion with cardiac tamponade: Used to be small to moderate but now on repeat echo was found to have a circumferential large pericardial effusion  with significant respiratory variation both mitral and tricuspid inflow with evidence of right ventricular diastolic collapse consistent with early tamponade physiology.  Cardiothoracic surgery is involved and cardiology will consult unless patient becomes hemodynamically unstable.  Need to consult cardiology ASAP if he does become hemodynamically unstable for pericardial centesis..  Lung cancer: Hx of recurrent stage IIIb adenocarcinoma. No evidence for recurrent or progressive disease on recent CT. Continue follow-up with Dr. Smith Robert as planned after discharge    Chest Pain: Ruled-out for ACS, likely d/t pleural effusion and Cardiac Tamponade. Troponn went from 8 -> 9   Hypokalemia: K+ went from 3.3 -> 3.7. CTM and Trend and repeat CMP in the AM    Microcytic Anemia: Appears stable with no overt bleeding but hemoglobin/hematocrit has dropped from 10.8/36.4 is now 9.4/32.1.  MCV is 76.4.  Continue monitor for S/Sx of Bleeding; No overt bleeding noted. Repeat CBC in the AM  Hyperglycemia: Blood Sugar elevated on Daily BMP. Check HbA1c.   Anxiety: C/w Hydroxyzine 25 mg po TIDprn and Lorazepam 0.5 mg po q6hprn

## 2023-04-25 ENCOUNTER — Inpatient Hospital Stay (HOSPITAL_COMMUNITY)
Admission: AD | Disposition: A | Discharge status: Home Health | Payer: Self-pay | Source: Home / Self Care | Attending: Internal Medicine

## 2023-04-25 ENCOUNTER — Inpatient Hospital Stay (HOSPITAL_COMMUNITY)

## 2023-04-25 ENCOUNTER — Inpatient Hospital Stay (HOSPITAL_COMMUNITY): Admitting: Anesthesiology

## 2023-04-25 ENCOUNTER — Encounter (HOSPITAL_COMMUNITY): Payer: Self-pay | Admitting: Family Medicine

## 2023-04-25 ENCOUNTER — Other Ambulatory Visit: Payer: Self-pay

## 2023-04-25 DIAGNOSIS — C3432 Malignant neoplasm of lower lobe, left bronchus or lung: Secondary | ICD-10-CM | POA: Diagnosis not present

## 2023-04-25 DIAGNOSIS — I3139 Other pericardial effusion (noninflammatory): Secondary | ICD-10-CM

## 2023-04-25 DIAGNOSIS — J90 Pleural effusion, not elsewhere classified: Secondary | ICD-10-CM

## 2023-04-25 DIAGNOSIS — E876 Hypokalemia: Secondary | ICD-10-CM | POA: Diagnosis not present

## 2023-04-25 DIAGNOSIS — I714 Abdominal aortic aneurysm, without rupture, unspecified: Secondary | ICD-10-CM

## 2023-04-25 DIAGNOSIS — I10 Essential (primary) hypertension: Secondary | ICD-10-CM

## 2023-04-25 DIAGNOSIS — R0789 Other chest pain: Secondary | ICD-10-CM | POA: Diagnosis not present

## 2023-04-25 LAB — COMPREHENSIVE METABOLIC PANEL
ALT: 15 U/L (ref 0–44)
ALT: 15 U/L (ref 0–44)
AST: 18 U/L (ref 15–41)
AST: 19 U/L (ref 15–41)
Albumin: 2.2 g/dL — ABNORMAL LOW (ref 3.5–5.0)
Albumin: 2.4 g/dL — ABNORMAL LOW (ref 3.5–5.0)
Alkaline Phosphatase: 50 U/L (ref 38–126)
Alkaline Phosphatase: 57 U/L (ref 38–126)
Anion gap: 7 (ref 5–15)
Anion gap: 7 (ref 5–15)
BUN: 15 mg/dL (ref 8–23)
BUN: 16 mg/dL (ref 8–23)
CO2: 25 mmol/L (ref 22–32)
CO2: 25 mmol/L (ref 22–32)
Calcium: 8.7 mg/dL — ABNORMAL LOW (ref 8.9–10.3)
Calcium: 8.9 mg/dL (ref 8.9–10.3)
Chloride: 110 mmol/L (ref 98–111)
Chloride: 109 mmol/L (ref 98–111)
Creatinine, Ser: 1.21 mg/dL (ref 0.61–1.24)
Creatinine, Ser: 1.42 mg/dL — ABNORMAL HIGH (ref 0.61–1.24)
GFR, Estimated: >60 mL/min (ref >60)
GFR, Estimated: 52 mL/min — ABNORMAL LOW (ref >60)
Glucose, Bld: 114 mg/dL — ABNORMAL HIGH (ref 70–99)
Glucose, Bld: 108 mg/dL — ABNORMAL HIGH (ref 70–99)
Potassium: 3.7 mmol/L (ref 3.5–5.1)
Potassium: 3.9 mmol/L (ref 3.5–5.1)
Sodium: 142 mmol/L (ref 135–145)
Sodium: 141 mmol/L (ref 135–145)
Total Bilirubin: 0.9 mg/dL (ref 0.0–1.2)
Total Bilirubin: 1 mg/dL (ref 0.0–1.2)
Total Protein: 6.7 g/dL (ref 6.5–8.1)
Total Protein: 7.2 g/dL (ref 6.5–8.1)

## 2023-04-25 LAB — CBC WITH DIFFERENTIAL/PLATELET
Abs Immature Granulocytes: 0.02 10*3/uL (ref 0.00–0.07)
Basophils Absolute: 0 10*3/uL (ref 0.0–0.1)
Basophils Relative: 0 %
Eosinophils Absolute: 0.1 10*3/uL (ref 0.0–0.5)
Eosinophils Relative: 1 %
HCT: 30.9 % — ABNORMAL LOW (ref 39.0–52.0)
Hemoglobin: 9.1 g/dL — ABNORMAL LOW (ref 13.0–17.0)
Immature Granulocytes: 0 %
Lymphocytes Relative: 13 %
Lymphs Abs: 0.8 10*3/uL (ref 0.7–4.0)
MCH: 22.5 pg — ABNORMAL LOW (ref 26.0–34.0)
MCHC: 29.4 g/dL — ABNORMAL LOW (ref 30.0–36.0)
MCV: 76.5 fL — ABNORMAL LOW (ref 80.0–100.0)
Monocytes Absolute: 0.8 10*3/uL (ref 0.1–1.0)
Monocytes Relative: 13 %
Neutro Abs: 4.6 10*3/uL (ref 1.7–7.7)
Neutrophils Relative %: 73 %
Platelets: 329 10*3/uL (ref 150–400)
RBC: 4.04 MIL/uL — ABNORMAL LOW (ref 4.22–5.81)
RDW: 19.9 % — ABNORMAL HIGH (ref 11.5–15.5)
WBC: 6.2 10*3/uL (ref 4.0–10.5)
nRBC: 0 % (ref 0.0–0.2)

## 2023-04-25 LAB — ABO/RH: ABO/RH(D): A POS

## 2023-04-25 LAB — CBC
HCT: 33.3 % — ABNORMAL LOW (ref 39.0–52.0)
Hemoglobin: 9.7 g/dL — ABNORMAL LOW (ref 13.0–17.0)
MCH: 22.4 pg — ABNORMAL LOW (ref 26.0–34.0)
MCHC: 29.1 g/dL — ABNORMAL LOW (ref 30.0–36.0)
MCV: 76.7 fL — ABNORMAL LOW (ref 80.0–100.0)
Platelets: 371 10*3/uL (ref 150–400)
RBC: 4.34 MIL/uL (ref 4.22–5.81)
RDW: 20.1 % — ABNORMAL HIGH (ref 11.5–15.5)
WBC: 6.7 10*3/uL (ref 4.0–10.5)
nRBC: 0 % (ref 0.0–0.2)

## 2023-04-25 LAB — PROTIME-INR
INR: 1.3 — ABNORMAL HIGH (ref 0.8–1.2)
Prothrombin Time: 16.8 s — ABNORMAL HIGH (ref 11.4–15.2)

## 2023-04-25 LAB — ECHO INTRAOPERATIVE TEE
Height: 70 in
Weight: 2938.29 [oz_av]

## 2023-04-25 LAB — PHOSPHORUS: Phosphorus: 3 mg/dL (ref 2.5–4.6)

## 2023-04-25 LAB — PREPARE RBC (CROSSMATCH)

## 2023-04-25 LAB — APTT: aPTT: 36 s (ref 24–36)

## 2023-04-25 LAB — MAGNESIUM: Magnesium: 2 mg/dL (ref 1.7–2.4)

## 2023-04-25 LAB — GLUCOSE, CAPILLARY: Glucose-Capillary: 114 mg/dL — ABNORMAL HIGH (ref 70–99)

## 2023-04-25 LAB — SURGICAL PCR SCREEN
MRSA, PCR: NEGATIVE
Staphylococcus aureus: NEGATIVE

## 2023-04-25 SURGERY — VIDEO ASSISTED THORACOSCOPY (VATS)/DECORTICATION
Anesthesia: General | Site: Chest | Laterality: Right

## 2023-04-25 MED ORDER — SUCCINYLCHOLINE CHLORIDE 200 MG/10ML IV SOSY
PREFILLED_SYRINGE | INTRAVENOUS | Status: DC | PRN
  Administered 2023-04-25: 120 mg via INTRAVENOUS

## 2023-04-25 MED ORDER — CEFAZOLIN SODIUM-DEXTROSE 2-4 GM/100ML-% IV SOLN
2.0000 g | INTRAVENOUS | Status: AC
  Administered 2023-04-25: 2 g via INTRAVENOUS
  Filled 2023-04-25: qty 100

## 2023-04-25 MED ORDER — GLYCOPYRROLATE 0.2 MG/ML IJ SOLN
INTRAMUSCULAR | Status: DC | PRN
  Administered 2023-04-25 (×2): .1 mg via INTRAVENOUS

## 2023-04-25 MED ORDER — SENNOSIDES-DOCUSATE SODIUM 8.6-50 MG PO TABS
1.0000 | ORAL_TABLET | Freq: Every day | ORAL | Status: DC
  Administered 2023-04-26 – 2023-04-30 (×5): 1 via ORAL
  Filled 2023-04-25 (×5): qty 1

## 2023-04-25 MED ORDER — 0.9 % SODIUM CHLORIDE (POUR BTL) OPTIME
TOPICAL | Status: DC | PRN
  Administered 2023-04-25: 3000 mL

## 2023-04-25 MED ORDER — EPINEPHRINE 1 MG/10ML IJ SOSY
PREFILLED_SYRINGE | INTRAMUSCULAR | Status: DC | PRN
  Administered 2023-04-25 (×2): 10 ug via INTRAVENOUS
  Administered 2023-04-25: 20 ug via INTRAVENOUS

## 2023-04-25 MED ORDER — HEPARIN 30,000 UNITS/1000 ML (OHS) CELLSAVER SOLUTION
Status: DC
  Filled 2023-04-25: qty 1000

## 2023-04-25 MED ORDER — VASOPRESSIN 20 UNIT/ML IV SOLN
INTRAVENOUS | Status: DC | PRN
  Administered 2023-04-25: 1 [IU] via INTRAVENOUS
  Administered 2023-04-25: 2.5 [IU] via INTRAVENOUS
  Administered 2023-04-25: 1 [IU] via INTRAVENOUS

## 2023-04-25 MED ORDER — LACTATED RINGERS IV SOLN: INTRAVENOUS | Status: DC

## 2023-04-25 MED ORDER — FENTANYL CITRATE (PF) 250 MCG/5ML IJ SOLN
INTRAMUSCULAR | Status: AC
  Filled 2023-04-25: qty 5

## 2023-04-25 MED ORDER — FENTANYL CITRATE (PF) 100 MCG/2ML IJ SOLN
25.0000 ug | Freq: Once | INTRAMUSCULAR | Status: AC
  Administered 2023-04-25: 25 ug via INTRAVENOUS

## 2023-04-25 MED ORDER — CEFAZOLIN SODIUM-DEXTROSE 2-4 GM/100ML-% IV SOLN
2.0000 g | INTRAVENOUS | Status: DC
  Filled 2023-04-25: qty 100

## 2023-04-25 MED ORDER — ALBUTEROL SULFATE HFA 108 (90 BASE) MCG/ACT IN AERS
INHALATION_SPRAY | RESPIRATORY_TRACT | Status: DC | PRN
  Administered 2023-04-25: 4 via RESPIRATORY_TRACT

## 2023-04-25 MED ORDER — PANTOPRAZOLE SODIUM 40 MG PO TBEC
40.0000 mg | DELAYED_RELEASE_TABLET | Freq: Every day | ORAL | Status: DC
  Administered 2023-04-26 – 2023-05-02 (×7): 40 mg via ORAL
  Filled 2023-04-25 (×7): qty 1

## 2023-04-25 MED ORDER — MANNITOL 20 % IV SOLN
INTRAVENOUS | Status: DC
  Filled 2023-04-25: qty 13

## 2023-04-25 MED ORDER — METOCLOPRAMIDE HCL 5 MG/ML IJ SOLN
10.0000 mg | Freq: Four times a day (QID) | INTRAMUSCULAR | Status: AC
  Administered 2023-04-26 – 2023-04-30 (×20): 10 mg via INTRAVENOUS
  Filled 2023-04-25 (×20): qty 2

## 2023-04-25 MED ORDER — HEMOSTATIC AGENTS (NO CHARGE) OPTIME
TOPICAL | Status: DC | PRN
Start: 2023-04-25 — End: 2023-04-25
  Administered 2023-04-25: 1 via TOPICAL

## 2023-04-25 MED ORDER — CHLORHEXIDINE GLUCONATE 0.12 % MT SOLN: 15.0000 mL | Freq: Once | OROMUCOSAL | Status: AC

## 2023-04-25 MED ORDER — TRANEXAMIC ACID (OHS) BOLUS VIA INFUSION
15.0000 mg/kg | INTRAVENOUS | Status: DC
  Filled 2023-04-25: qty 1250

## 2023-04-25 MED ORDER — LIDOCAINE 1 % OPTIME INJ - NO CHARGE
INTRAMUSCULAR | Status: DC | PRN
  Administered 2023-04-25: 5 mL

## 2023-04-25 MED ORDER — PHENYLEPHRINE HCL-NACL 20-0.9 MG/250ML-% IV SOLN
INTRAVENOUS | Status: DC | PRN
  Administered 2023-04-25: 5 ug/min via INTRAVENOUS

## 2023-04-25 MED ORDER — PHENYLEPHRINE HCL-NACL 20-0.9 MG/250ML-% IV SOLN
30.0000 ug/min | INTRAVENOUS | Status: DC
  Filled 2023-04-25: qty 250

## 2023-04-25 MED ORDER — FENTANYL CITRATE (PF) 250 MCG/5ML IJ SOLN
INTRAMUSCULAR | Status: DC | PRN
  Administered 2023-04-25 (×2): 25 ug via INTRAVENOUS
  Administered 2023-04-25: 50 ug via INTRAVENOUS

## 2023-04-25 MED ORDER — NOREPINEPHRINE 4 MG/250ML-% IV SOLN
INTRAVENOUS | Status: AC
  Filled 2023-04-25: qty 250

## 2023-04-25 MED ORDER — TRANEXAMIC ACID 1000 MG/10ML IV SOLN
1.5000 mg/kg/h | INTRAVENOUS | Status: DC
  Filled 2023-04-25: qty 25

## 2023-04-25 MED ORDER — CEFAZOLIN SODIUM-DEXTROSE 2-4 GM/100ML-% IV SOLN
2.0000 g | Freq: Three times a day (TID) | INTRAVENOUS | Status: AC
  Administered 2023-04-26 (×2): 2 g via INTRAVENOUS
  Filled 2023-04-25 (×2): qty 100

## 2023-04-25 MED ORDER — VANCOMYCIN HCL 1250 MG/250ML IV SOLN
1250.0000 mg | INTRAVENOUS | Status: DC
  Filled 2023-04-25: qty 250

## 2023-04-25 MED ORDER — LIDOCAINE 2% (20 MG/ML) 5 ML SYRINGE
INTRAMUSCULAR | Status: AC
  Filled 2023-04-25: qty 5

## 2023-04-25 MED ORDER — PLASMA-LYTE A IV SOLN
INTRAVENOUS | Status: DC
  Filled 2023-04-25: qty 2.5

## 2023-04-25 MED ORDER — DEXMEDETOMIDINE HCL IN NACL 400 MCG/100ML IV SOLN
0.1000 ug/kg/h | INTRAVENOUS | Status: DC
  Filled 2023-04-25: qty 100

## 2023-04-25 MED ORDER — ACETAMINOPHEN 160 MG/5ML PO SOLN: 1000.0000 mg | Freq: Four times a day (QID) | ORAL | Status: AC

## 2023-04-25 MED ORDER — BUPIVACAINE LIPOSOME 1.3 % IJ SUSP
INTRAMUSCULAR | Status: AC
  Filled 2023-04-25: qty 20

## 2023-04-25 MED ORDER — FENTANYL CITRATE (PF) 100 MCG/2ML IJ SOLN
25.0000 ug | INTRAMUSCULAR | Status: DC | PRN
  Administered 2023-04-25: 50 ug via INTRAVENOUS
  Administered 2023-04-25: 25 ug via INTRAVENOUS
  Administered 2023-04-25: 50 ug via INTRAVENOUS
  Administered 2023-04-25: 25 ug via INTRAVENOUS

## 2023-04-25 MED ORDER — FENTANYL CITRATE (PF) 100 MCG/2ML IJ SOLN
INTRAMUSCULAR | Status: AC
  Filled 2023-04-25: qty 2

## 2023-04-25 MED ORDER — MORPHINE SULFATE (PF) 2 MG/ML IV SOLN
2.0000 mg | INTRAVENOUS | Status: DC | PRN
  Administered 2023-04-25: 2 mg via INTRAVENOUS
  Filled 2023-04-25: qty 1

## 2023-04-25 MED ORDER — ROCURONIUM BROMIDE 10 MG/ML (PF) SYRINGE
PREFILLED_SYRINGE | INTRAVENOUS | Status: DC | PRN
  Administered 2023-04-25: 30 mg via INTRAVENOUS
  Administered 2023-04-25 (×2): 20 mg via INTRAVENOUS

## 2023-04-25 MED ORDER — BISACODYL 5 MG PO TBEC
10.0000 mg | DELAYED_RELEASE_TABLET | Freq: Every day | ORAL | Status: DC
  Administered 2023-04-26 – 2023-05-02 (×7): 10 mg via ORAL
  Filled 2023-04-25 (×7): qty 2

## 2023-04-25 MED ORDER — ORAL CARE MOUTH RINSE: 15.0000 mL | Freq: Once | OROMUCOSAL | Status: AC

## 2023-04-25 MED ORDER — ETOMIDATE 2 MG/ML IV SOLN
INTRAVENOUS | Status: DC | PRN
  Administered 2023-04-25: 14 mg via INTRAVENOUS

## 2023-04-25 MED ORDER — ETOMIDATE 2 MG/ML IV SOLN
INTRAVENOUS | Status: AC
  Filled 2023-04-25: qty 10

## 2023-04-25 MED ORDER — NOREPINEPHRINE 4 MG/250ML-% IV SOLN
0.0000 ug/min | INTRAVENOUS | Status: DC
  Filled 2023-04-25: qty 250

## 2023-04-25 MED ORDER — NITROGLYCERIN IN D5W 200-5 MCG/ML-% IV SOLN
2.0000 ug/min | INTRAVENOUS | Status: DC
  Filled 2023-04-25: qty 250

## 2023-04-25 MED ORDER — CHLORHEXIDINE GLUCONATE 0.12 % MT SOLN
OROMUCOSAL | Status: AC
  Administered 2023-04-25: 15 mL via OROMUCOSAL
  Filled 2023-04-25: qty 15

## 2023-04-25 MED ORDER — ROCURONIUM BROMIDE 10 MG/ML (PF) SYRINGE
PREFILLED_SYRINGE | INTRAVENOUS | Status: AC
  Filled 2023-04-25: qty 40

## 2023-04-25 MED ORDER — INSULIN REGULAR(HUMAN) IN NACL 100-0.9 UT/100ML-% IV SOLN
INTRAVENOUS | Status: DC
  Filled 2023-04-25: qty 100

## 2023-04-25 MED ORDER — BUPIVACAINE HCL (PF) 0.5 % IJ SOLN
INTRAMUSCULAR | Status: AC
  Filled 2023-04-25: qty 30

## 2023-04-25 MED ORDER — EPINEPHRINE HCL 5 MG/250ML IV SOLN IN NS
0.0000 ug/min | INTRAVENOUS | Status: AC
  Administered 2023-04-25: 3 ug/min via INTRAVENOUS
  Filled 2023-04-25: qty 5

## 2023-04-25 MED ORDER — INSULIN REGULAR(HUMAN) IN NACL 100-0.9 UT/100ML-% IV SOLN: INTRAVENOUS | Status: DC

## 2023-04-25 MED ORDER — EPINEPHRINE HCL 5 MG/250ML IV SOLN IN NS
INTRAVENOUS | Status: AC
  Filled 2023-04-25: qty 250

## 2023-04-25 MED ORDER — MORPHINE SULFATE (PF) 2 MG/ML IV SOLN
2.0000 mg | INTRAVENOUS | Status: DC | PRN
Start: 2023-04-25 — End: 2023-05-02
  Administered 2023-04-25 – 2023-04-26 (×2): 2 mg via INTRAVENOUS
  Administered 2023-04-26: 4 mg via INTRAVENOUS
  Administered 2023-04-29: 2 mg via INTRAVENOUS
  Filled 2023-04-25 (×2): qty 1
  Filled 2023-04-25: qty 2
  Filled 2023-04-25: qty 1

## 2023-04-25 MED ORDER — POTASSIUM CHLORIDE 2 MEQ/ML IV SOLN
80.0000 meq | INTRAVENOUS | Status: DC
  Filled 2023-04-25: qty 40

## 2023-04-25 MED ORDER — ONDANSETRON HCL 4 MG/2ML IJ SOLN
INTRAMUSCULAR | Status: DC | PRN
Start: 2023-04-25 — End: 2023-04-25
  Administered 2023-04-25: 4 mg via INTRAVENOUS

## 2023-04-25 MED ORDER — TRANEXAMIC ACID (OHS) PUMP PRIME SOLUTION
2.0000 mg/kg | INTRAVENOUS | Status: DC
  Filled 2023-04-25: qty 1.67

## 2023-04-25 MED ORDER — SUGAMMADEX SODIUM 200 MG/2ML IV SOLN
INTRAVENOUS | Status: AC
  Filled 2023-04-25: qty 2

## 2023-04-25 MED ORDER — LIDOCAINE 2% (20 MG/ML) 5 ML SYRINGE
INTRAMUSCULAR | Status: DC | PRN
  Administered 2023-04-25: 40 mg via INTRAVENOUS

## 2023-04-25 MED ORDER — SUGAMMADEX SODIUM 200 MG/2ML IV SOLN
INTRAVENOUS | Status: DC | PRN
  Administered 2023-04-25: 166.6 mg via INTRAVENOUS

## 2023-04-25 MED ORDER — ACETAMINOPHEN 500 MG PO TABS
1000.0000 mg | ORAL_TABLET | Freq: Four times a day (QID) | ORAL | Status: AC
  Administered 2023-04-26 – 2023-04-30 (×20): 1000 mg via ORAL
  Filled 2023-04-25 (×20): qty 2

## 2023-04-25 MED ORDER — SODIUM CHLORIDE (PF) 0.9 % IJ SOLN
INTRAMUSCULAR | Status: AC
  Filled 2023-04-25: qty 50

## 2023-04-25 MED ORDER — EPINEPHRINE HCL 5 MG/250ML IV SOLN IN NS
0.0000 ug/min | INTRAVENOUS | Status: DC
  Filled 2023-04-25: qty 250

## 2023-04-25 MED ORDER — MILRINONE LACTATE IN DEXTROSE 20-5 MG/100ML-% IV SOLN
0.3000 ug/kg/min | INTRAVENOUS | Status: DC
Start: 2023-04-25 — End: 2023-04-25
  Filled 2023-04-25: qty 100

## 2023-04-25 MED ORDER — VASOPRESSIN 20 UNIT/ML IV SOLN
INTRAVENOUS | Status: AC
  Filled 2023-04-25: qty 1

## 2023-04-25 SURGICAL SUPPLY — 62 items
APPLICATOR COTTON TIP 6 STRL (MISCELLANEOUS) IMPLANT
APPLICATOR COTTON TIP 6IN STRL (MISCELLANEOUS) ×4 IMPLANT
APPLICATOR TIP EXT COSEAL (VASCULAR PRODUCTS) IMPLANT
BLADE CLIPPER SURG (BLADE) ×4 IMPLANT
BLADE SURG 15 STRL LF DISP TIS (BLADE) IMPLANT
CANISTER SUCT 3000ML PPV (MISCELLANEOUS) ×4 IMPLANT
CATH THORACIC 28FR (CATHETERS) IMPLANT
CATH THORACIC 28FR RT ANG (CATHETERS) IMPLANT
CATH THORACIC 36FR (CATHETERS) IMPLANT
CATH THORACIC 36FR RT ANG (CATHETERS) IMPLANT
CLIP TI MEDIUM 6 (CLIP) ×4 IMPLANT
CNTNR URN SCR LID CUP LEK RST (MISCELLANEOUS) ×8 IMPLANT
CONNECTOR BLAKE 2:1 CARIO BLK (MISCELLANEOUS) IMPLANT
COVER SURGICAL LIGHT HANDLE (MISCELLANEOUS) ×4 IMPLANT
DERMABOND ADVANCED .7 DNX12 (GAUZE/BANDAGES/DRESSINGS) IMPLANT
DRAIN CHANNEL 19F RND (DRAIN) IMPLANT
DRAIN CHANNEL 28F RND 3/8 FF (WOUND CARE) IMPLANT
DRAIN CONNECTOR BLAKE 1:1 (MISCELLANEOUS) IMPLANT
DRAPE CV SPLIT W-CLR ANES SCRN (DRAPES) ×4 IMPLANT
DRAPE SURG ORHT 6 SPLT 77X108 (DRAPES) ×4 IMPLANT
ELECT BLADE 6.5 EXT (BLADE) ×4 IMPLANT
ELECT REM PT RETURN 9FT ADLT (ELECTROSURGICAL) ×4 IMPLANT
ELECTRODE REM PT RTRN 9FT ADLT (ELECTROSURGICAL) ×4 IMPLANT
GAUZE SPONGE 4X4 12PLY STRL (GAUZE/BANDAGES/DRESSINGS) ×8 IMPLANT
GLOVE SURG SIGNA 7.5 PF LTX (GLOVE) ×8 IMPLANT
GOWN STRL REUS W/ TWL LRG LVL3 (GOWN DISPOSABLE) ×8 IMPLANT
GOWN STRL REUS W/ TWL XL LVL3 (GOWN DISPOSABLE) ×4 IMPLANT
HEMOSTAT SURGICEL 2X14 (HEMOSTASIS) IMPLANT
KIT BASIN OR (CUSTOM PROCEDURE TRAY) ×4 IMPLANT
KIT TURNOVER KIT B (KITS) ×4 IMPLANT
NDL HYPO 25GX1X1/2 BEV (NEEDLE) ×4 IMPLANT
NDL SPNL 22GX3.5 QUINCKE BK (NEEDLE) ×4 IMPLANT
NEEDLE HYPO 25GX1X1/2 BEV (NEEDLE) ×4 IMPLANT
NEEDLE SPNL 22GX3.5 QUINCKE BK (NEEDLE) ×4 IMPLANT
NS IRRIG 1000ML POUR BTL (IV SOLUTION) ×12 IMPLANT
PACK CHEST (CUSTOM PROCEDURE TRAY) ×4 IMPLANT
PAD ARMBOARD 7.5X6 YLW CONV (MISCELLANEOUS) ×8 IMPLANT
PAD ELECT DEFIB RADIOL ZOLL (MISCELLANEOUS) ×4 IMPLANT
PENCIL BUTTON HOLSTER BLD 10FT (ELECTRODE) IMPLANT
SEALANT PROGEL (MISCELLANEOUS) IMPLANT
SOL ANTI FOG 6CC (MISCELLANEOUS) ×4 IMPLANT
SPONGE INTESTINAL PEANUT (DISPOSABLE) IMPLANT
SPONGE T-LAP 18X18 ~~LOC~~+RFID (SPONGE) IMPLANT
SPONGE TONSIL 1 RF SGL (DISPOSABLE) ×4 IMPLANT
SUT SILK 1 MH (SUTURE) ×8 IMPLANT
SUT VIC AB 1 CTX36XBRD ANBCTR (SUTURE) ×4 IMPLANT
SUT VIC AB 2-0 CTX 36 (SUTURE) ×4 IMPLANT
SUT VIC AB 3-0 X1 27 (SUTURE) ×4 IMPLANT
SYR 10ML LL (SYRINGE) IMPLANT
SYR 30ML LL (SYRINGE) ×4 IMPLANT
SYSTEM SAHARA CHEST DRAIN ATS (WOUND CARE) ×4 IMPLANT
TAPE CLOTH 4X10 WHT NS (GAUZE/BANDAGES/DRESSINGS) ×4 IMPLANT
TAPE CLOTH SURG 4X10 WHT LF (GAUZE/BANDAGES/DRESSINGS) IMPLANT
TOWEL GREEN STERILE (TOWEL DISPOSABLE) ×4 IMPLANT
TOWEL GREEN STERILE FF (TOWEL DISPOSABLE) ×4 IMPLANT
TRAP SPECIMEN MUCUS 40CC (MISCELLANEOUS) ×8 IMPLANT
TRAY FOLEY MTR SLVR 16FR STAT (SET/KITS/TRAYS/PACK) ×4 IMPLANT
TRAY WAYNE PNEUMOTHORAX 14X18 (TRAY / TRAY PROCEDURE) IMPLANT
TROCAR 11X100 Z THREAD (TROCAR) IMPLANT
TROCAR XCEL BLADELESS 5X75MML (TROCAR) ×4 IMPLANT
TUBE CONNECTING 20X1/4 (TUBING) IMPLANT
WATER STERILE IRR 1000ML POUR (IV SOLUTION) ×8 IMPLANT

## 2023-04-25 NOTE — Progress Notes (Signed)
 PROGRESS NOTE    Mitchell Frye  ZOX:[id number] DOB: January 27, 1951 DOA: 04/23/2023 PCP: [facility]   Brief Narrative:  Patient is a 73 year old AAM has medical history significant for HTN, HLD, NSCLC, recurrent pleural effusion on the left as well as the comorbidities who presented with chest pain or shortness breath.  He has been complaining of chest pain shortness breath has been present for months and is slowly worsening  He was diagnosed with stage IIIb lung cancer and was seen by CT Surgery and it was felt that additional thoracentesis for his pleural effusions would not be beneficial and VATS drainage of the effusion and decortication of the lung was recommended.    Initially refused to undergo procedure but now since has changed his mind due to symptoms and presented back to the ED and was transferred from [facility].  CXR revealed a moderate size loculated pleural left-sided effusion. TCTS was consulted and recommended a CT of his chest without contrast and TTE.  This was done and showed the findings as below and now TCTS is recommending surgical intervention he will be going for VATS with drainage of pleural effusion, decortication and pericardial window later today.  Assessment and Plan:  Recurrent Left Pleural Effusion:  CT consulted by ED and requested a repeat echocardiogram as well as a CT of the chest without contrast. CT showed increased of small moderate pericardial effusion and a stable aneurysm in the ascending aorta as well as grossly stable moderate to large left pleural effusion with pleural thickening. Echocardiogram done and showed an EF of 55 to 60% with left ventricular normal function and global hypokinesis mild asymmetric left ventricular hypertrophy and left ventricular diastolic parameters are indeterminate. Dr. Dorris Fetch evaluated and pt undergoing a VATS with drainage of pleural effusion, decortication and pericardial window 04/25/2023.  Pericardial  Effusion with Cardiac Tamponade: Used to be small to moderate but now on repeat ECHO was found to have a circumferential large pericardial effusion with significant respiratory variation both mitral and tricuspid inflow with evidence of right ventricular diastolic collapse consistent with early tamponade physiology.  Cardiothoracic surgery is involved and cardiology will not consult unless patient becomes hemodynamically unstable.    Lung Cancer: Hx of recurrent stage IIIb adenocarcinoma. No evidence for recurrent or progressive disease on recent CT. Continue follow-up with Dr. Smith Robert as planned after discharge    AKI: BUN/Cr Trending up and went from 13/1.12 -> 15/1.21 -> 16/1.42. Defer IVF to TCTS and Avoid Nephrotoxic Medications, Contrast Dyes, Hypotension and Dehydration to Ensure Adequate Renal Perfusion and will need to Renally Adjust Meds. 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 F/U outpt setting and yearly monitoring  Chest Pain: Ruled-out for ACS, likely d/t pleural effusion and Cardiac Tamponade. Troponn went from 8 -> 9. Undergoing VATS for drainage of pleural effusion, decortication and pericardial window today    Hypokalemia: Resolved. CTM and Trend and repeat CMP in the AM    Microcytic Anemia: Appears stable with no overt bleeding. Hgb/Hct went from 10.8/36.4 -> 9.4/32.1 -> 9.7/33.3 w/ MCV is 76.7.  CTM for S/Sx of Bleeding; No overt bleeding noted. Repeat CBC in the AM  Hyperglycemia: Blood Sugar elevated on Daily labs ranging from 108-114. Check HbA1c in the AM  Anxiety: C/w Hydroxyzine 25 mg po TIDprn and Lorazepam 0.5 mg po q6hprn  Hypoalbuminemia Patient's Albumin ranging from 2.2-2.4. CTM and Trend and repeat CMP in the AM    DVT prophylaxis: Pneumatic SCD boots to accompany all patients  to O.R. Start: 04/25/23 0934 SCDs Start: 04/23/23 2004    Code Status: Full Code Family Communication: No family present at bedside   Disposition Plan:  Level of care: Progressive Status is: Inpatient Remains inpatient appropriate because: Needs surgical intervention   Consultants:  D/w Cardiology  CT Surgery Dr. Dorris Fetch   Procedures:  As delineated as above. ECHO and CT Chest w/o Contrast   Antimicrobials:  Anti-infectives (From admission, onward)    Start     Dose/Rate Route Frequency Ordered Stop   04/25/23 0933  ceFAZolin (ANCEF) IVPB 2g/100 mL premix        2 g 200 mL/hr over 30 Minutes Intravenous 30 min pre-op 04/25/23 0934         Subjective: Seen and examined at bedside and still feels very dyspneic upon any exertion.  Understands he will be undergoing surgical intervention later today.  No nausea or vomiting.  States that he has anxiety attacks and had 3 attacks yesterday.  No other concerns or points this time.  Objective: Vitals:   04/25/23 0400 04/25/23 0600 04/25/23 0711 04/25/23 1141  BP: 116/83 129/88 (!) 132/90 (!) 132/102  Pulse: 91 97 (!) 101 (!) 104  Resp: (!) 21 (!) 21 20 20   Temp: (!) 97.5 F (36.4 C)  98.5 F (36.9 C) 98.3 F (36.8 C)  TempSrc: Oral  Oral Oral  SpO2: 96% 99% 98% 98%  Weight:      Height:        Intake/Output Summary (Last 24 hours) at 04/25/2023 1401 Last data filed at 04/25/2023 1200 Gross per 24 hour  Intake 480 ml  Output 200 ml  Net 280 ml   Filed Weights   04/23/23 1902  Weight: 83.3 kg   Examination: Physical Exam:  Constitutional: Elderly overweight chronically ill-appearing African-American male who appears dyspneic Respiratory: Diminished to auscultation bilaterally with some coarse breath sounds worse on the left compared to the right but no appreciable wheezing or rales.  Slight rhonchi but no appreciable crackles.  Has a normal respiratory effort and is not tachypneic or using accessory muscles but is  wearing supplemental oxygen via nasal cannula Cardiovascular: RRR, no murmurs / rubs / gallops. S1 and S2 auscultated.  Has some slight pitting edema Abdomen: Soft, non-tender, mildly distended secondary body habitus. Bowel sounds positive.  GU: Deferred. Musculoskeletal: No clubbing / cyanosis of digits/nails. No joint deformity upper and lower extremities.  Skin: No rashes, lesions, ulcers on a limited skin evaluation. No induration; Warm and dry.  Neurologic: CN 2-12 grossly intact with no focal deficits.  Romberg sign and cerebellar reflexes not assessed.  Psychiatric: Awake and alert but very little anxious  Data Reviewed: I have personally reviewed following labs and imaging studies  CBC: Recent Labs  Lab 04/23/23 1208 04/24/23 0155 04/25/23 0212 04/25/23 1035  WBC 6.8 6.7 6.2 6.7  NEUTROABS  --   --  4.6  --   HGB 10.8* 9.4* 9.1* 9.7*  HCT 36.4* 32.1* 30.9* 33.3*  MCV 75.5* 76.4* 76.5* 76.7*  PLT 401* 363 329 371   Basic Metabolic Panel: Recent Labs  Lab 04/23/23 1208 04/24/23 0155 04/25/23 0212 04/25/23 1035  NA 142 144 142 141  K 3.3* 3.7 3.7 3.9  CL 108 111 110 109  CO2 24 24 25 25   GLUCOSE 174* 108* 114* 108*  BUN 16 13 15 16   CREATININE 1.12 1.12 1.21 1.42*  CALCIUM 8.9 8.8* 8.7* 8.9  MG  --  2.2 2.0  --   PHOS  --   --  3.0  --    GFR: Estimated Creatinine Clearance: 47.8 mL/min (A) (by C-G formula based on SCr of 1.42 mg/dL (H)). Liver Function Tests: Recent Labs  Lab 04/25/23 0212 04/25/23 1035  AST 18 19  ALT 15 15  ALKPHOS 50 57  BILITOT 0.9 1.0  PROT 6.7 7.2  ALBUMIN 2.2* 2.4*   No results for input(s): "LIPASE", "AMYLASE" in the last 168 hours. No results for input(s): "AMMONIA" in the last 168 hours. Coagulation Profile: Recent Labs  Lab 04/25/23 1035  INR 1.3*   Cardiac Enzymes: No results for input(s): "CKTOTAL", "CKMB", "CKMBINDEX", "TROPONINI" in the last 168 hours. BNP (last 3 results) No results for input(s): "PROBNP" in  the last 8760 hours. HbA1C: No results for input(s): "HGBA1C" in the last 72 hours. CBG: No results for input(s): "GLUCAP" in the last 168 hours. Lipid Profile: No results for input(s): "CHOL", "HDL", "LDLCALC", "TRIG", "CHOLHDL", "LDLDIRECT" in the last 72 hours. Thyroid Function Tests: No results for input(s): "TSH", "T4TOTAL", "FREET4", "T3FREE", "THYROIDAB" in the last 72 hours. Anemia Panel: No results for input(s): "VITAMINB12", "FOLATE", "FERRITIN", "TIBC", "IRON", "RETICCTPCT" in the last 72 hours. Sepsis Labs: No results for input(s): "PROCALCITON", "LATICACIDVEN" in the last 168 hours.  No results found for this or any previous visit (from the past 240 hours).   Radiology Studies: DG CHEST PORT 1 VIEW Result Date: 04/25/2023 CLINICAL DATA:  Shortness of breath. EXAM: PORTABLE CHEST 1 VIEW COMPARISON:  Radiograph 04/23/2023.  CT 04/24/2023 FINDINGS: Right chest port remains in place. Large left and moderate to large right pleural effusions, without significant interval change. Stable heart size and mediastinal contours. No pneumothorax. IMPRESSION: Large left and moderate to large right pleural effusions, without significant interval change. Electronically Signed   By: Narda Rutherford M.D.   On: 04/25/2023 10:47   CT CHEST WO CONTRAST Result Date: 04/24/2023 CLINICAL DATA:  Dyspnea, chronic, chest wall or pleura disease suspected. Non-small cell lung cancer. EXAM: CT CHEST WITHOUT CONTRAST TECHNIQUE: Multidetector CT imaging of the chest was performed following the standard protocol without IV contrast. RADIATION DOSE REDUCTION: This exam was performed according to the departmental dose-optimization program which includes automated exposure control, adjustment of the mA and/or kV according to patient size and/or use of iterative reconstruction technique. COMPARISON:  PET CT 12/12/2022, CT chest 03/23/2023 FINDINGS: Cardiovascular: Right chest wall Port-A-Cath with tip terminating at  the superior caval junction. Small heart size. Slightly increased small to moderate pericardial effusion. Enlarged ascending thoracic aorta measuring up to 4.2 cm. Mild atherosclerotic plaque of the thoracic aorta. No coronary artery calcifications. Mediastinum/Nodes: No gross hilar adenopathy, noting limited sensitivity for the detection of hilar adenopathy on this noncontrast study. No enlarged mediastinal or axillary lymph nodes. Thyroid gland, trachea, and esophagus demonstrate no significant findings. Lungs/Pleura: Almost complete collapse of the left lower lobe. Passive atelectasis of the left upper lobe. Passive atelectasis of the right lower lobe. Stable subpleural right 6 x 4 mm pulmonary nodule (3:71). No pulmonary mass interval increase in size of small to moderate right pleural effusion. Grossly stable moderate to large left pleural effusion with query pleural thickening-limited evaluation on this noncontrast study. No pneumothorax. Upper Abdomen: No acute abnormality. Musculoskeletal: No chest wall abnormality. No suspicious lytic or blastic osseous lesions. No acute displaced fracture. IMPRESSION:  1. Slightly increased small to moderate pericardial effusion. Small heart size with limited evaluation on this noncontrast study. 2. 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 Grossly stable moderate to large left pleural effusion with query pleural thickening-limited evaluation on this noncontrast study. 4. Limited evaluation this noncontrast study. These results will be called to the ordering clinician or representative by the Radiologist Assistant, and communication documented in the PACS or [employer]. Electronically Signed   By: Tish Frederickson M.D.   On: 04/24/2023 21:38   ECHOCARDIOGRAM COMPLETE Result Date: 04/24/2023    ECHOCARDIOGRAM REPORT   Patient Name:   CANDEN CIESLINSKI Date of Exam: 04/24/2023 Medical Rec #:  [id number]    Height:       70.0 in Accession #:    [id number]    Weight:       183.6 lb Date of Birth:  04/16/1950     BSA:          2.013 m Patient Age:    72 years     BP:           134/98 mmHg Patient Gender: M            HR:           109 bpm. Exam Location:  Inpatient Procedure: 2D Echo, Color Doppler and Cardiac Doppler (Both Spectral and Color            Flow Doppler were utilized during procedure). Indications:    I31.3 Pericardial effusion (noninflammatory)  History:        Patient has prior history of Echocardiogram examinations, most                 recent 11/11/2020. Risk Factors:Hypertension and Dyslipidemia.                 Left Lung Cancer.  Sonographer:    Irving Burton Senior RDCS Sonographer#2:  Rosaland Lao Referring Phys: [id number] Kateri Mc LATIF Yen Wandell IMPRESSIONS  1. Left ventricular ejection fraction, by estimation, is 55 to 60%. Left ventricular ejection fraction by 2D MOD biplane is 43.4 %. The left ventricle has normal function. The left ventricle demonstrates global hypokinesis. There is mild asymmetric left  ventricular hypertrophy of the septal segment. Left ventricular diastolic parameters are indeterminate.  2. Right ventricular systolic function is normal. The right ventricular size is normal. Tricuspid regurgitation signal is inadequate for assessing PA pressure.  3. Significant respiratory variation in both mitral and tricuspid inflows, evidence of RV diastolic collapse, IVC is slightly collapsible but dilated consistent with early tamponade physiology. Large pericardial effusion. The pericardial effusion is circumferential. Findings are consistent with cardiac tamponade.  4. The mitral valve is normal in structure. Trivial mitral valve regurgitation.  5. The aortic valve is tricuspid. Aortic valve regurgitation is mild to moderate.  6. Aortic dilatation noted. There is mild dilatation of the aortic root, measuring 42 mm.  7. The inferior vena cava is dilated in size with <50% respiratory variability, suggesting right atrial pressure of 15 mmHg.  Conclusion(s)/Recommendation(s): Large circumfrential pericardial effusion with evidenece of tamponade physiology. Ordering team will be notified. FINDINGS  Left Ventricle: Left ventricular ejection fraction, by estimation, is 55 to 60%. Left ventricular ejection fraction by 2D MOD biplane is 43.4 %. The left ventricle has normal function. The left ventricle demonstrates global hypokinesis. The left ventricular internal cavity size was normal in size. There is mild asymmetric left ventricular hypertrophy of the septal segment. Left ventricular diastolic parameters are indeterminate. Right Ventricle:  The right ventricular size is normal. No increase in right ventricular wall thickness. Right ventricular systolic function is normal. Tricuspid regurgitation signal is inadequate for assessing PA pressure. Left Atrium: Left atrial size was normal in size. Right Atrium: Right atrial size was normal in size. Pericardium: Significant respiratory variation in both mitral and tricuspid inflows, evidence of RV diastolic collapse, IVC is slightly collapsible but dilated consistent with early tamponade physiology. A large pericardial effusion is present. The pericardial effusion is circumferential. There is evidence of cardiac tamponade. Thickening/calcification of pericardium present. Mitral Valve: The mitral valve is normal in structure. Trivial mitral valve regurgitation. Tricuspid Valve: The tricuspid valve is normal in structure. Tricuspid valve regurgitation is trivial. Aortic Valve: The aortic valve is tricuspid. Aortic valve regurgitation is mild to moderate. Pulmonic Valve: The pulmonic valve was normal in structure. Pulmonic valve regurgitation is trivial. Aorta: The aortic root and ascending aorta are structurally normal, with no evidence of dilitation and aortic dilatation noted. There is mild dilatation of the aortic root, measuring 42 mm. Venous: The inferior vena cava is dilated in size with less than 50%  respiratory variability, suggesting right atrial pressure of 15 mmHg. The inferior vena cava and the hepatic vein show a systolic blunting flow pattern. IAS/Shunts: No atrial level shunt detected by color flow Doppler. Additional Comments: There is pleural effusion in the left lateral region.  LEFT VENTRICLE PLAX 2D                        Biplane EF (MOD) LVIDd:         4.10 cm         LV Biplane EF:   Left LVIDs:         2.60 cm                          ventricular LV PW:         0.90 cm                          ejection LV IVS:        1.10 cm                          fraction by LVOT diam:     2.20 cm                          2D MOD LV SV:         42                               biplane is LV SV Index:   21                               43.4 %. LVOT Area:     3.80 cm  LV Volumes (MOD) LV vol d, MOD    75.3 ml A2C: LV vol d, MOD    56.9 ml A4C: LV vol s, MOD    40.3 ml A2C: LV vol s, MOD    32.7 ml A4C: LV SV MOD A2C:   35.0 ml LV SV MOD A4C:   56.9 ml LV SV MOD BP:    28.6 ml RIGHT VENTRICLE  RV S prime:     16.00 cm/s TAPSE (M-mode): 1.8 cm LEFT ATRIUM             Index        RIGHT ATRIUM           Index LA diam:        3.40 cm 1.69 cm/m   RA Area:     12.30 cm LA Vol (A2C):   61.9 ml 30.75 ml/m  RA Volume:   26.90 ml  13.36 ml/m LA Vol (A4C):   18.3 ml 9.09 ml/m LA Biplane Vol: 33.2 ml 16.49 ml/m  AORTIC VALVE LVOT Vmax:   74.40 cm/s LVOT Vmean:  55.033 cm/s LVOT VTI:    0.111 m  AORTA Ao Root diam: 4.20 cm Ao Asc diam:  3.70 cm  SHUNTS Systemic VTI:  0.11 m Systemic Diam: 2.20 cm Arvilla Meres MD Electronically signed by Arvilla Meres MD Signature Date/Time: 04/24/2023/2:53:34 PM    Final    Scheduled Meds:  chlorhexidine       sodium chloride flush  3 mL Intravenous Q12H   Continuous Infusions:   ceFAZolin (ANCEF) IV      LOS: 2 days   Marguerita Merles, DO [facility] Available via Epic secure chat 7am-7pm After these hours, please refer to coverage provider listed on  [email or web address] 04/25/2023, 2:01 PM

## 2023-04-25 NOTE — Brief Op Note (Addendum)
 04/25/2023  7:35 PM  PATIENT:  Mitchell Frye  73 y.o. male  PRE-OPERATIVE DIAGNOSIS:  RECURRENT LEFT PLEURAL EFFUSION PERICARDIAL EFFUSION MODERATE RIGHT EFFUSION  POST-OPERATIVE DIAGNOSIS:  RECURRENT LEFT PLEURAL EFFUSION PERICARDIAL EFFUSION MODERATE RIGHT EFFUSION  PROCEDURE:  Procedure(s): VIDEO ASSISTED THORACOSCOPY (VATS)/DECORTICATION (Left) DRAINAGE, PLEURAL EFFUSION (Bilateral) DRAINAGE, PERICARDIAL EFFUSION (N/A) RIGHT PLEURAL CHEST TUBE INSERTION (Right) CREATION, PERICARDIAL WINDOW (N/A)  SURGEON:  Surgeons and Role:    * Loreli Slot, MD - Primary  PHYSICIAN ASSISTANT: Lowella Dandy PA-C  ASSISTANTS: none   ANESTHESIA:   general  EBL:  50 mL   BLOOD ADMINISTERED:none  DRAINS:  28 Blake Drain, 19 Pericardial Drain    LOCAL MEDICATIONS USED:  NONE  SPECIMEN:  Source of Specimen:  Pericardial fluid, pleural fluid, pleural peel, visceral peel  DISPOSITION OF SPECIMEN:   Cytology, pathology  COUNTS:  YES  TOURNIQUET:  * No tourniquets in log *  DICTATION: .Dragon Dictation  PLAN OF CARE:  Patient with active order for admission  PATIENT DISPOSITION:  PACU - hemodynamically stable.   Delay start of Pharmacological VTE agent (>24hrs) due to surgical blood loss or risk of bleeding: yes

## 2023-04-25 NOTE — Progress Notes (Signed)
 Mobility Specialist Progress Note;   04/25/23 1105  Mobility  Activity Dangled on edge of bed  Level of Assistance Minimal assist, patient does 75% or more  Assistive Device None  Activity Response Tolerated fair  Mobility Referral Yes  Mobility visit 1 Mobility  Mobility Specialist Start Time (ACUTE ONLY) 1105  Mobility Specialist Stop Time (ACUTE ONLY) 1115  Mobility Specialist Time Calculation (min) (ACUTE ONLY) 10 min   Pt initially agreeable to transfer to chair for session. Required MinA for bed mobility to sit on EOB. Upon sitting up, pt began coughing and wheezing unable to do further mobility. Encouraged and educated pt on importance of staying mobile. Returned pt comfortably back to bed with all needs met, alarm on. VSS on 2LO2.   Caesar Bookman Mobility Specialist Please contact via SecureChat or [employer] [phone]

## 2023-04-25 NOTE — Progress Notes (Signed)
 Chaplain responded to a page to provide spiritual support for Pt, particularly after Pt knowing about some medical procedures he will undergo this evening. Pt had a moment to share and reflect with Chaplain about the challenges he is facing with news about his health. Pt shared with Chaplain about his faith as a coping tool to face this time. Pt shared he misses meditation as long and extended periods of time to connect with God and shared how it is difficult to get that meditative process while being in the hospital. Through reflective listening, Chaplain invited Pt to share his fears and Pt acknowledge and recognize his faith as the support system he needs at this point. Pt asked Chaplain to come back to visit him after the surgery, tomorrow.  Oneida Alar Chaplain Resident   04/25/23 1051  Spiritual Encounters  Type of Visit Initial  Care provided to: Patient  Referral source Clinical staff;Chaplain assessment  Reason for visit Routine spiritual support  OnCall Visit No  Spiritual Framework  Presenting Themes Meaning/purpose/sources of inspiration;Courage hope and growth  Community/Connection Faith community  Patient Stress Factors Health changes;Loss of control  Family Stress Factors None identified  Interventions  Spiritual Care Interventions Made Reflective listening;Explored values/beliefs/practices/strengths;Meaning making;Encouragement  Intervention Outcomes  Outcomes Connection to spiritual care;Awareness around self/spiritual resourses;Connection to values and goals of care;Reduced fear

## 2023-04-25 NOTE — Progress Notes (Signed)
 Received report from [employer]. Patient stated he had breakfast this am between 0700 and 0900. Dr. Glade Stanford with anesthesia made aware.

## 2023-04-25 NOTE — Anesthesia Preprocedure Evaluation (Signed)
 Anesthesia Evaluation  Patient identified by MRN, date of birth, ID band Patient awake    Reviewed: Allergy & Precautions, NPO status , Patient's Chart, lab work & pertinent test results  Airway Mallampati: II  TM Distance: >3 FB Neck ROM: Full    Dental  (+) Dental Advisory Given   Pulmonary shortness of breath, former smoker Lung CA   breath sounds clear to auscultation+ rhonchi  + decreased breath sounds      Cardiovascular hypertension, Pt. on medications  Rhythm:Regular Rate:Normal  IMPRESSIONS      1. Left ventricular ejection fraction, by estimation, is 55 to 60%. Left ventricular ejection fraction by 2D MOD biplane is 43.4 %. The left ventricle has normal function. The left ventricle demonstrates global hypokinesis. There is mild asymmetric left  ventricular hypertrophy of the septal segment. Left ventricular diastolic parameters are indeterminate.  2. Right ventricular systolic function is normal. The right ventricular size is normal. Tricuspid regurgitation signal is inadequate for assessing PA pressure.  3. Significant respiratory variation in both mitral and tricuspid inflows, evidence of RV diastolic collapse, IVC is slightly collapsible but dilated consistent with early tamponade physiology. Large pericardial effusion. The pericardial effusion is circumferential. Findings are consistent with cardiac tamponade.  4. The mitral valve is normal in structure. Trivial mitral valve regurgitation.  5. The aortic valve is tricuspid. Aortic valve regurgitation is mild to moderate.  6. Aortic dilatation noted. There is mild dilatation of the aortic root, measuring 42 mm.  7. The inferior vena cava is dilated in size with <50% respiratory variability, suggesting right atrial pressure of 15 mmHg.    Neuro/Psych negative neurological ROS     GI/Hepatic negative GI ROS, Neg liver ROS,,,  Endo/Other    Renal/GU Renal  InsufficiencyRenal disease     Musculoskeletal   Abdominal   Peds  Hematology  (+) Blood dyscrasia, anemia   Anesthesia Other Findings   Reproductive/Obstetrics                             Anesthesia Physical Anesthesia Plan  ASA: 4 and emergent  Anesthesia Plan: General   Post-op Pain Management:    Induction: Intravenous and Rapid sequence  PONV Risk Score and Plan: 2 and Dexamethasone, Ondansetron and Treatment may vary due to age or medical condition  Airway Management Planned: Double Lumen EBT  Additional Equipment: Arterial line  Intra-op Plan:   Post-operative Plan: Possible Post-op intubation/ventilation  Informed Consent: I have reviewed the patients History and Physical, chart, labs and discussed the procedure including the risks, benefits and alternatives for the proposed anesthesia with the patient or authorized representative who has indicated his/her understanding and acceptance.     Dental advisory given  Plan Discussed with: CRNA  Anesthesia Plan Comments:        Anesthesia Quick Evaluation

## 2023-04-25 NOTE — Transfer of Care (Signed)
 Immediate Anesthesia Transfer of Care Note  Patient: Mitchell Frye  Procedure(s) Performed: VIDEO ASSISTED THORACOSCOPY (VATS)/DECORTICATION (Left: Chest) DRAINAGE, PLEURAL EFFUSION (Bilateral) DRAINAGE, PERICARDIAL EFFUSION (Chest) RIGHT PLEURAL CHEST TUBE INSERTION (Right: Chest) CREATION, PERICARDIAL WINDOW (Chest)  Patient Location: PACU  Anesthesia Type:General  Level of Consciousness: awake, alert , pateint uncooperative, and confused  Airway & Oxygen Therapy: Patient Spontanous Breathing and Patient connected to face mask oxygen  Post-op Assessment: Report given to RN and Post -op Vital signs reviewed and stable  Post vital signs: Reviewed and stable  Last Vitals:  Vitals Value Taken Time  BP 193/121 04/25/23 2000  Temp 98   Pulse 115 04/25/23 2002  Resp 34 04/25/23 2002  SpO2 93 % 04/25/23 2002  Vitals shown include unfiled device data.  Last Pain:  Vitals:   04/25/23 1426  TempSrc: Oral  PainSc: 0-No pain         Complications: No notable events documented.

## 2023-04-25 NOTE — Anesthesia Procedure Notes (Signed)
 Arterial Line Insertion Start/End3/12/2023 2:40 PM, 04/25/2023 2:45 PM Performed by: Marcene Duos, MD, Yolonda Kida, CRNA, CRNA  Patient location: Pre-op. Preanesthetic checklist: patient identified, IV checked, site marked, risks and benefits discussed, surgical consent, monitors and equipment checked, pre-op evaluation, timeout performed and anesthesia consent Lidocaine 1% used for infiltration Right, radial was placed Catheter size: 20 G Hand hygiene performed  and maximum sterile barriers used   Attempts: 1 Procedure performed without using ultrasound guided technique. Following insertion, dressing applied and Biopatch. Post procedure assessment: normal and unchanged  Patient tolerated the procedure well with no immediate complications.

## 2023-04-25 NOTE — Progress Notes (Signed)
      [address]       [place] [address]             [phone]      Pt seen in PACU, some confusion waking up Hemodynamics stable C/o urge to urinate- has Foley in place CXR reviewed - incomplete reexpansion of left lung- not surprising given circumstances. Serosanguinous drainage from tube with small air leak Small amount of bloody drainage from right chest tube- that effusion is incompletely drained  Viviann Spare C. Dorris Fetch, MD [facility] [phone]

## 2023-04-25 NOTE — Interval H&P Note (Signed)
 History and Physical Interval Note:  04/25/2023 4:03 PM  Mitchell Frye  has presented today for surgery, with the diagnosis of RECURRENT LEFT PLEURAL EFFUSION PERICARDIAL EFFUSION MODERATE RIGHT EFFUSION.  The various methods of treatment have been discussed with the patient and family. After consideration of risks, benefits and other options for treatment, the patient has consented to  Procedure(s): VIDEO ASSISTED THORACOSCOPY (VATS)/DECORTICATION (Left) DRAINAGE, PLEURAL EFFUSION (Left) DRAINAGE, PERICARDIAL EFFUSION (N/A) CREATION, PERICARDIAL WINDOW, SUBXIPHOID APPROACH (N/A) CHEST TUBE INSERTION (Right) as a surgical intervention.  The patient's history has been reviewed, patient examined, no change in status, stable for surgery.  I have reviewed the patient's chart and labs.  Questions were answered to the patient's satisfaction.     Loreli Slot

## 2023-04-25 NOTE — Progress Notes (Signed)
 Procedure(s) (LRB): VIDEO ASSISTED THORACOSCOPY (VATS)/DECORTICATION (Left) DRAINAGE, PLEURAL EFFUSION (Left) DRAINAGE, PERICARDIAL EFFUSION (N/A) CREATION, PERICARDIAL WINDOW, SUBXIPHOID APPROACH (N/A) CHEST TUBE INSERTION (Right) Subjective: C/o cough and pleuritic right sided chest pain  Objective: Vital signs in last 24 hours: Temp:  [97.2 F (36.2 C)-98.5 F (36.9 C)] 98.5 F (36.9 C) (03/11 0711) Pulse Rate:  [89-105] 101 (03/11 0711) Cardiac Rhythm: Sinus tachycardia (03/10 2000) Resp:  [19-27] 20 (03/11 0711) BP: (104-150)/(83-105) 132/90 (03/11 0711) SpO2:  [95 %-100 %] 98 % (03/11 0711)  Hemodynamic parameters for last 24 hours:    Intake/Output from previous day: 03/10 0701 - 03/11 0700 In: 480 [P.O.:480] Out: 400 [Urine:400] Intake/Output this shift: Total I/O In: 360 [P.O.:360] Out: 200 [Urine:200]  General appearance: alert, cooperative, and mild distress Neurologic: intact Heart: tachy, regular Lungs: diminished breath sounds bibasilar and absent on left posteriorly Extremities: no edema  Lab Results: Recent Labs    04/24/23 0155 04/25/23 0212  WBC 6.7 6.2  HGB 9.4* 9.1*  HCT 32.1* 30.9*  PLT 363 329   BMET:  Recent Labs    04/24/23 0155 04/25/23 0212  NA 144 142  K 3.7 3.7  CL 111 110  CO2 24 25  GLUCOSE 108* 114*  BUN 13 15  CREATININE 1.12 1.21  CALCIUM 8.8* 8.7*    PT/INR: No results for input(s): "LABPROT", "INR" in the last 72 hours. ABG No results found for: "PHART", "HCO3", "TCO2", "ACIDBASEDEF", "O2SAT" CBG (last 3)  No results for input(s): "GLUCAP" in the last 72 hours.  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  Previously refused VATS.  Now presents with right-sided pleuritic chest pain, shortness of breath, and general malaise.  Cough for about a month.  Workup was revealed a slight increase in the size of the left pleural effusion, new right pleural effusion, and an increase in his pericardial effusion.  Echocardiogram showed early echocardiographic signs of cardiac tamponade.  No hypotension but is mildly tachycardic this morning.  I advised him to undergo right chest tube placement and left VATS for drainage of pleural effusion, decortication, and pericardial window.  There is a possibility that we will not be able to access the pericardial effusion from the left chest in which case we would do a subxiphoid window.  I informed him of the general nature of the procedure including the incisions to be used, the use of drainage tubes postoperatively, the expected hospital stay, and the overall recovery.  I informed him of the indications, risks, benefits, and alternatives.  He understands the risks include, but not limited to death, MI, DVT, PE, bleeding, possible need for transfusion, infection, air leaks, cardiac arrhythmias, as well as the possibility of other procedural complications.  He accepts the risk and agrees to proceed.  Will plan to proceed with surgery this afternoon.   LOS: 2 days    Loreli Slot 04/25/2023

## 2023-04-26 ENCOUNTER — Encounter (HOSPITAL_COMMUNITY): Payer: Self-pay | Admitting: Thoracic Surgery (Cardiothoracic Vascular Surgery)

## 2023-04-26 ENCOUNTER — Inpatient Hospital Stay (HOSPITAL_COMMUNITY)

## 2023-04-26 DIAGNOSIS — G9341 Metabolic encephalopathy: Secondary | ICD-10-CM | POA: Diagnosis not present

## 2023-04-26 DIAGNOSIS — E441 Mild protein-calorie malnutrition: Secondary | ICD-10-CM | POA: Diagnosis not present

## 2023-04-26 DIAGNOSIS — J9601 Acute respiratory failure with hypoxia: Secondary | ICD-10-CM | POA: Diagnosis not present

## 2023-04-26 DIAGNOSIS — J90 Pleural effusion, not elsewhere classified: Secondary | ICD-10-CM | POA: Diagnosis not present

## 2023-04-26 LAB — COMPREHENSIVE METABOLIC PANEL
ALT: 19 U/L (ref 0–44)
AST: 35 U/L (ref 15–41)
Albumin: 2 g/dL — ABNORMAL LOW (ref 3.5–5.0)
Alkaline Phosphatase: 47 U/L (ref 38–126)
Anion gap: 5 (ref 5–15)
BUN: 14 mg/dL (ref 8–23)
CO2: 24 mmol/L (ref 22–32)
Calcium: 8.4 mg/dL — ABNORMAL LOW (ref 8.9–10.3)
Chloride: 110 mmol/L (ref 98–111)
Creatinine, Ser: 1.41 mg/dL — ABNORMAL HIGH (ref 0.61–1.24)
GFR, Estimated: 53 mL/min — ABNORMAL LOW (ref >60)
Glucose, Bld: 102 mg/dL — ABNORMAL HIGH (ref 70–99)
Potassium: 4.2 mmol/L (ref 3.5–5.1)
Sodium: 139 mmol/L (ref 135–145)
Total Bilirubin: 1.3 mg/dL — ABNORMAL HIGH (ref 0.0–1.2)
Total Protein: 6 g/dL — ABNORMAL LOW (ref 6.5–8.1)

## 2023-04-26 LAB — CBC WITH DIFFERENTIAL/PLATELET
Abs Immature Granulocytes: 0.06 10*3/uL (ref 0.00–0.07)
Basophils Absolute: 0 10*3/uL (ref 0.0–0.1)
Basophils Relative: 0 %
Eosinophils Absolute: 0.1 10*3/uL (ref 0.0–0.5)
Eosinophils Relative: 1 %
HCT: 31.8 % — ABNORMAL LOW (ref 39.0–52.0)
Hemoglobin: 9.6 g/dL — ABNORMAL LOW (ref 13.0–17.0)
Immature Granulocytes: 1 %
Lymphocytes Relative: 7 %
Lymphs Abs: 0.7 10*3/uL (ref 0.7–4.0)
MCH: 22.9 pg — ABNORMAL LOW (ref 26.0–34.0)
MCHC: 30.2 g/dL (ref 30.0–36.0)
MCV: 75.7 fL — ABNORMAL LOW (ref 80.0–100.0)
Monocytes Absolute: 0.8 10*3/uL (ref 0.1–1.0)
Monocytes Relative: 7 %
Neutro Abs: 9.6 10*3/uL — ABNORMAL HIGH (ref 1.7–7.7)
Neutrophils Relative %: 84 %
Platelets: 352 10*3/uL (ref 150–400)
RBC: 4.2 MIL/uL — ABNORMAL LOW (ref 4.22–5.81)
RDW: 19.9 % — ABNORMAL HIGH (ref 11.5–15.5)
WBC: 11.2 10*3/uL — ABNORMAL HIGH (ref 4.0–10.5)
nRBC: 0 % (ref 0.0–0.2)

## 2023-04-26 LAB — MAGNESIUM: Magnesium: 1.7 mg/dL (ref 1.7–2.4)

## 2023-04-26 LAB — HEMOGLOBIN A1C
Hgb A1c MFr Bld: 5.9 % — ABNORMAL HIGH (ref 4.8–5.6)
Mean Plasma Glucose: 122.63 mg/dL

## 2023-04-26 LAB — PHOSPHORUS: Phosphorus: 3.2 mg/dL (ref 2.5–4.6)

## 2023-04-26 MED ORDER — PHENOL 1.4 % MT LIQD
1.0000 | OROMUCOSAL | Status: DC | PRN
  Filled 2023-04-26: qty 177

## 2023-04-26 MED ORDER — CHLORHEXIDINE GLUCONATE CLOTH 2 % EX PADS
6.0000 | MEDICATED_PAD | Freq: Every day | CUTANEOUS | Status: DC
  Administered 2023-04-26 – 2023-05-02 (×6): 6 via TOPICAL

## 2023-04-26 MED ORDER — SODIUM CHLORIDE 0.45 % IV SOLN: INTRAVENOUS | Status: DC

## 2023-04-26 MED ORDER — MAGNESIUM SULFATE 2 GM/50ML IV SOLN
2.0000 g | Freq: Once | INTRAVENOUS | Status: AC
Start: 2023-04-26 — End: 2023-04-26
  Administered 2023-04-26: 2 g via INTRAVENOUS
  Filled 2023-04-26: qty 50

## 2023-04-26 MED ORDER — ORAL CARE MOUTH RINSE: 15.0000 mL | OROMUCOSAL | Status: DC | PRN

## 2023-04-26 MED ORDER — ALBUMIN HUMAN 25 % IV SOLN
25.0000 g | Freq: Four times a day (QID) | INTRAVENOUS | Status: AC
  Administered 2023-04-26 – 2023-04-27 (×4): 25 g via INTRAVENOUS
  Filled 2023-04-26 (×4): qty 100

## 2023-04-26 NOTE — Consult Note (Signed)
 NAME:  Mitchell Frye, MRN:  [id number], DOB:  1950/10/17, LOS: 3 ADMISSION DATE:  04/23/2023, CONSULTATION DATE:  04/26/23 REFERRING MD:  TRH, CHIEF COMPLAINT:  SOB   History of Present Illness:  73 year old man w/ hx of left lung [name] adeno s/p chemoradiation thought in remission who has been having issues with recurrent left effusion presenting with worsening chest pain and SOB.  Workup revealed again a large loculated left effusion but also an enlarging pericardial effusion with tamponade features.  Therefore patient was taken to operating room by Dr. Dorris Fetch for L VATS and pericardial effusion 3/11.  Postop course complicated by delayed emergence from anesthesia and some confusion.  PCCM to take over as patient was TRH pre-op.  Patient currently is tired, does not really want to eat, oriented to self + place but not date.  Pertinent  Medical History  Stage 3 NSCLC ? In remission vs. Recurrence, path of fluid pending HTN HLD  Significant Hospital Events: Including procedures, antibiotic start and stop dates in addition to other pertinent events   3/9 admit 3/11 VATS+pericardial window  Interim History / Subjective:  As above  Objective   Blood pressure 99/74, pulse 99, temperature 99 F (37.2 C), temperature source Axillary, resp. rate 18, height 5\' 10"  (1.778 m), weight 81.3 kg, SpO2 94%.        Intake/Output Summary (Last 24 hours) at 04/26/2023 0737 Last data filed at 04/26/2023 0600 Gross per 24 hour  Intake 460 ml  Output 2300 ml  Net -1840 ml   Filed Weights   04/23/23 1902 04/25/23 2200 04/26/23 0554  Weight: 83.3 kg 82.9 kg 81.3 kg    Examination: General: chronically ill appearing man in NAD HENT: +tempora wasting mild, MM dry Lungs: diminished on both sides, multiple L chest tubes with fair output, tube on R with minimal output Cardiovascular: heart sounds regular, ext warm, mediastinal drain small sanguinous output Abdomen: soft, hypoactive  BS Extremities: mild muscle wasting and trace edema Neuro: nonfocal but he is confused GU: foley in place  Patient Lines/Drains/Airways Status     Active Line/Drains/Airways     Name Placement date Placement time Site Days   Implanted Port 10/22/19 Right Chest 10/22/19  1442  Chest  1282   Arterial Line 04/25/23 Right Radial 04/25/23  1440  Radial  1   Peripheral IV 04/23/23 18 G Right Antecubital 04/23/23  1431  Antecubital  3   Peripheral IV 04/25/23 Left Antecubital 04/25/23  1942  Antecubital  1   Chest Tube 1 Medial Other (Comment) 19 Fr. 04/25/23  1906  Other (Comment)  1   Y Chest Tube 1 and 2 1 Left Pleural 28 Fr. 2 Left Pleural 28 Fr. 04/25/23  1907  -- 1   Incision (Closed) 04/25/23 Chest Right 04/25/23  1817  -- 1   Incision (Closed) 04/25/23 Chest 04/25/23  1817  -- 1            Resolved Hospital Problem list   N/A  Assessment & Plan:  Acute hypoxemic respiratory failure secondary to recurrent pleural effusion and acute on chronic pericardial effusion with signs of tamponade pre-op- now s/p left VATS w decortication as well as percardial window creation 3/11 [name] L lung cancer s/p chemoradiation- in remission and cyto from effusion neg from prior thora; new path pending from intra-op R and L pleural effusions- both with tubes in place now Mild protein calorie malnutrition POA Metabolic encephalopathy postop- hopefully improves with time,  reorientation Postop mild AKI- getting IVF  - Chest and mediastinal drains per TCTS: R tube out, may need drainage at later date on that side - Encourage IS, wean O2 for sats > 90% - f/u intra-op pleural fluid/peel, pericardial cyto - advance diet as tolerated - remains in ICU while having pericardial window  Best Practice (right click and "Reselect all SmartList Selections" daily)   Diet/type: clear liquids DVT prophylaxis SCD Pressure ulcer(s): N/A GI prophylaxis: N/A Lines: port, art line Foley:  Yes, and it is  still needed Code Status:  full code Last date of multidisciplinary goals of care discussion [updated patient]  Labs   CBC: Recent Labs  Lab 04/23/23 1208 04/24/23 0155 04/25/23 0212 04/25/23 1035 04/26/23 0425  WBC 6.8 6.7 6.2 6.7 11.2*  NEUTROABS  --   --  4.6  --  9.6*  HGB 10.8* 9.4* 9.1* 9.7* 9.6*  HCT 36.4* 32.1* 30.9* 33.3* 31.8*  MCV 75.5* 76.4* 76.5* 76.7* 75.7*  PLT 401* 363 329 371 352    Basic Metabolic Panel: Recent Labs  Lab 04/23/23 1208 04/24/23 0155 04/25/23 0212 04/25/23 1035 04/26/23 0425  NA 142 144 142 141 139  K 3.3* 3.7 3.7 3.9 4.2  CL 108 111 110 109 110  CO2 24 24 25 25 24   GLUCOSE 174* 108* 114* 108* 102*  BUN 16 13 15 16 14   CREATININE 1.12 1.12 1.21 1.42* 1.41*  CALCIUM 8.9 8.8* 8.7* 8.9 8.4*  MG  --  2.2 2.0  --  1.7  PHOS  --   --  3.0  --  3.2   GFR: Estimated Creatinine Clearance: 48.2 mL/min (A) (by C-G formula based on SCr of 1.41 mg/dL (H)). Recent Labs  Lab 04/24/23 0155 04/25/23 0212 04/25/23 1035 04/26/23 0425  WBC 6.7 6.2 6.7 11.2*    Liver Function Tests: Recent Labs  Lab 04/25/23 0212 04/25/23 1035 04/26/23 0425  AST 18 19 35  ALT 15 15 19   ALKPHOS 50 57 47  BILITOT 0.9 1.0 1.3*  PROT 6.7 7.2 6.0*  ALBUMIN 2.2* 2.4* 2.0*   No results for input(s): "LIPASE", "AMYLASE" in the last 168 hours. No results for input(s): "AMMONIA" in the last 168 hours.  ABG No results found for: "PHART", "PCO2ART", "PO2ART", "HCO3", "TCO2", "ACIDBASEDEF", "O2SAT"   Coagulation Profile: Recent Labs  Lab 04/25/23 1035  INR 1.3*    Cardiac Enzymes: No results for input(s): "CKTOTAL", "CKMB", "CKMBINDEX", "TROPONINI" in the last 168 hours.  HbA1C: Hgb A1c MFr Bld  Date/Time Value Ref Range Status  04/26/2023 04:25 AM 5.9 (H) 4.8 - 5.6 % Final    Comment:    (NOTE) Pre diabetes:          5.7%-6.4%  Diabetes:              >6.4%  Glycemic control for   <7.0% adults with diabetes     CBG: Recent Labs  Lab  04/25/23 2200  GLUCAP 114*    Review of Systems:   Too confused to answer  Past Medical History:  He,  has a past medical history of Dyspnea, Hyperlipidemia, Hypertension, and Primary malignant neoplasm of left lower lobe of lung (HCC).   Surgical History:   Past Surgical History:  Procedure Laterality Date   ESOPHAGOGASTRODUODENOSCOPY (EGD) WITH PROPOFOL N/A 12/01/2020   Procedure: ESOPHAGOGASTRODUODENOSCOPY (EGD) WITH PROPOFOL;  Surgeon: Toney Reil, MD;  Location: [facility] ENDOSCOPY;  Service: Gastroenterology;  Laterality: N/A;   IR THORACENTESIS ASP PLEURAL SPACE W/IMG  GUIDE  12/26/2022   PORTA CATH INSERTION N/A 10/22/2019   Procedure: PORTA CATH INSERTION;  Surgeon: Renford Dills, MD;  Location: [facility] INVASIVE CV LAB;  Service: Cardiovascular;  Laterality: N/A;   VIDEO BRONCHOSCOPY WITH ENDOBRONCHIAL ULTRASOUND N/A 09/25/2019   Procedure: VIDEO BRONCHOSCOPY WITH ENDOBRONCHIAL ULTRASOUND;  Surgeon: Salena Saner, MD;  Location: [facility] ORS;  Service: Pulmonary;  Laterality: N/A;     Social History:   reports that he quit smoking about 31 years ago. His smoking use included cigarettes. He started smoking about 51 years ago. He has a 20 pack-year smoking history. He has never used smokeless tobacco. He reports that he does not currently use alcohol. He reports that he does not use drugs.   Family History:  His family history includes Cancer in his brother.   Allergies Allergies  Allergen Reactions   Taxol [Paclitaxel] Other (See Comments)    Chest pain, hip pain, coughing , wheezing     Home Medications  Prior to Admission medications   Medication Sig Start Date End Date Taking? Authorizing Provider  albuterol (VENTOLIN HFA) 108 (90 Base) MCG/ACT inhaler Inhale 2 puffs into the lungs every 4 (four) hours as needed for wheezing or shortness of breath. 02/12/21  Yes Creig Hines, MD  ANORO ELLIPTA 62.5-25 MCG/ACT AEPB INHALE 1 PUFF INTO THE LUNGS DAILY Patient  taking differently: Inhale 1 puff into the lungs daily as needed (SOB). 07/06/21  Yes Creig Hines, MD  Budeson-Glycopyrrol-Formoterol (BREZTRI AEROSPHERE) 160-9-4.8 MCG/ACT AERO Inhale 2 puffs into the lungs in the morning and at bedtime. Patient not taking: Reported on 04/24/2023 08/04/21   Alinda Dooms, NP  FLOVENT HFA 220 MCG/ACT inhaler Inhale 2 puffs into the lungs 2 (two) times daily. Patient not taking: Reported on 04/24/2023 04/06/21   [provider]  prochlorperazine (COMPAZINE) 10 MG tablet TAKE 1 TABLET(10 MG) BY MOUTH EVERY 6 HOURS AS NEEDED FOR NAUSEA OR VOMITING 10/22/19 11/25/19  Creig Hines, MD     Critical care time: N/A

## 2023-04-26 NOTE — Progress Notes (Signed)
 1 Day Post-Op Procedure(s) (LRB): VIDEO ASSISTED THORACOSCOPY (VATS)/DECORTICATION (Left) DRAINAGE, PLEURAL EFFUSION (Bilateral) DRAINAGE, PERICARDIAL EFFUSION (N/A) RIGHT PLEURAL CHEST TUBE INSERTION (Right) CREATION, PERICARDIAL WINDOW (N/A) Subjective: C/o pain but won't specify chest, abdomen, catheter, etc  Objective: Vital signs in last 24 hours: Temp:  [97.6 F (36.4 C)-100.2 F (37.9 C)] 99 F (37.2 C) (03/12 0400) Pulse Rate:  [98-135] 99 (03/12 0600) Cardiac Rhythm: Sinus tachycardia (03/12 0415) Resp:  [12-37] 18 (03/12 0600) BP: (93-132)/(63-105) 99/74 (03/12 0600) SpO2:  [91 %-100 %] 94 % (03/12 0600) Arterial Line BP: (93-163)/(48-71) 118/65 (03/12 0600) Weight:  [81.3 kg-82.9 kg] 81.3 kg (03/12 0554)  Hemodynamic parameters for last 24 hours:    Intake/Output from previous day: 03/11 0701 - 03/12 0700 In: 460 [P.O.:360; IV Piggyback:100] Out: 2300 [Urine:1205; Blood:100; Chest Tube:995] Intake/Output this shift: No intake/output data recorded.  General appearance: cooperative and no distress Neurologic: intact Heart: regular rate and rhythm Lungs: diminished breath sounds bibasilar Abdomen: mildly distended  Lab Results: Recent Labs    04/25/23 1035 04/26/23 0425  WBC 6.7 11.2*  HGB 9.7* 9.6*  HCT 33.3* 31.8*  PLT 371 352   BMET:  Recent Labs    04/25/23 1035 04/26/23 0425  NA 141 139  K 3.9 4.2  CL 109 110  CO2 25 24  GLUCOSE 108* 102*  BUN 16 14  CREATININE 1.42* 1.41*  CALCIUM 8.9 8.4*    PT/INR:  Recent Labs    04/25/23 1035  LABPROT 16.8*  INR 1.3*   ABG No results found for: "PHART", "HCO3", "TCO2", "ACIDBASEDEF", "O2SAT" CBG (last 3)  Recent Labs    04/25/23 2200  GLUCAP 114*    Assessment/Plan: S/P Procedure(s) (LRB): VIDEO ASSISTED THORACOSCOPY (VATS)/DECORTICATION (Left) DRAINAGE, PLEURAL EFFUSION (Bilateral) DRAINAGE, PERICARDIAL EFFUSION (N/A) RIGHT PLEURAL CHEST TUBE INSERTION (Right) CREATION,  PERICARDIAL WINDOW (N/A) POD # 1 Overall stable hemodynamically although BP lower than it was preop In SR Creatinine mildly elevated but stable Will dc right chest tube as I think it is subdiaphragmatic Keep left chest tubes and pericardial drain to suction today SCD for DVT prophylaxis Anemia- Hgb stable from preop mobilize    LOS: 3 days    Loreli Slot 04/26/2023

## 2023-04-26 NOTE — Evaluation (Signed)
 Physical Therapy Evaluation Patient Details Name: Mitchell Frye MRN: [id number] DOB: [birth year] Today's Date: 04/26/2023  History of Present Illness  Mr. Milstein is a 73 year old male who presented to ED on 3/9 with SOB and R chest pain. Pt underwent L VATS procedure due to recurrent L pleural effusion. PMH:HTN, HLD, stage IIIb adenocarcinoma of the lung and a recurrent left pleural effusion causing chronic dyspnea.   Clinical Impression  Pt admitted with above. Pt with c/o 9/10 L flank pain. Pt requiring max encouragement to get up however once up did very well. Pt amb 160' with eva walker and transitioned from requiring modA to minA. As pain improved I am hopefull he will progress well and be able to d/c home with family and HHPT. Acute PT to cont to follow.        If plan is discharge home, recommend the following: A little help with walking and/or transfers;A little help with bathing/dressing/bathroom;Help with stairs or ramp for entrance   Can travel by private vehicle        Equipment Recommendations Rolling walker (2 wheels) (may not need)  Recommendations for Other Services       Functional Status Assessment       Precautions / Restrictions Precautions Precautions: Fall Precaution/Restrictions Comments: 2 chest tubes Restrictions Weight Bearing Restrictions Per Provider Order: No      Mobility  Bed Mobility Overal bed mobility: Needs Assistance Bed Mobility: Supine to Sit     Supine to sit: Mod assist     General bed mobility comments: modA for LE management to EOB and for trunk elevation with HOB elevated, modA to scoot to EOB to place feet on the floor    Transfers Overall transfer level: Needs assistance Equipment used: 2 person hand held assist Transfers: Sit to/from Stand Sit to Stand: Min assist, +2 physical assistance, +2 safety/equipment           General transfer comment: bed elevated, no use of UEs, minA to power up     Ambulation/Gait Ambulation/Gait assistance: Mod assist, +2 safety/equipment Gait Distance (Feet): 160 Feet Assistive device: Fara Boros Gait Pattern/deviations: Decreased stride length, Trunk flexed Gait velocity: dec Gait velocity interpretation: <1.31 ft/sec, indicative of household ambulator   General Gait Details: pt initially step to staggering pattern requiring modA for walker management and to maintain upright posture however s/p 50' pt with improved posture, step length, reciprocal gait pattern and diminished to minA. pt took 4 standing rest breaks, 2nd person for chair follow and line mangement  Stairs            Wheelchair Mobility     Tilt Bed    Modified Rankin (Stroke Patients Only)       Balance Overall balance assessment: Needs assistance Sitting-balance support: Feet supported, No upper extremity supported Sitting balance-Leahy Scale: Fair     Standing balance support: Bilateral upper extremity supported, During functional activity, Reliant on assistive device for balance Standing balance-Leahy Scale: Poor Standing balance comment: reliant on external support                             Pertinent Vitals/Pain Pain Assessment Pain Assessment: 0-10 Pain Score: 9  Pain Location: L flank Pain Descriptors / Indicators: Sharp Pain Intervention(s): Premedicated before session, Monitored during session    Home Living Family/patient expects to be discharged to:: Private residence Living Arrangements: Other relatives (borther and sister) Available Help at Discharge: Family;Available 24  hours/day Type of Home: House Home Access: Stairs to enter Entrance Stairs-Rails: None Entrance Stairs-Number of Steps: 3   Home Layout: One level Home Equipment: None      Prior Function Prior Level of Function : Independent/Modified Independent             Mobility Comments: pt reports indep without AD however lives sedentary life and listens to  music or watches tv all day ADLs Comments: indep     Extremity/Trunk Assessment   Upper Extremity Assessment Upper Extremity Assessment: Generalized weakness    Lower Extremity Assessment Lower Extremity Assessment: Generalized weakness    Cervical / Trunk Assessment Cervical / Trunk Assessment: Other exceptions Cervical / Trunk Exceptions: multiple chest tubes  Communication   Communication Communication: Impaired (initially very soft spoken and difficulty to understand but improved at end of session)    Cognition Arousal: Alert Behavior During Therapy: Flat affect                           PT - Cognition Comments: pt initially with flat afffect, quiet, not conversating however once up and walking pt with improved spirits and carried on conversation with PT and RN Following commands: Intact       Cueing Cueing Techniques: Verbal cues, Tactile cues     General Comments General comments (skin integrity, edema, etc.): pt with bilat LE edema, drainage from R chest tube site (removed earlier in AM), RN and Dr. Dorris Fetch aware    Exercises     Assessment/Plan    PT Assessment Patient needs continued PT services  PT Problem List Decreased strength;Decreased activity tolerance;Decreased balance;Decreased mobility       PT Treatment Interventions DME instruction;Gait training;Stair training;Functional mobility training;Therapeutic activities;Therapeutic exercise;Balance training    PT Goals (Current goals can be found in the Care Plan section)  Acute Rehab PT Goals Patient Stated Goal: improve pain PT Goal Formulation: With patient Time For Goal Achievement: 05/10/23 Potential to Achieve Goals: Good    Frequency Min 3X/week     Co-evaluation               AM-PAC PT "6 Clicks" Mobility  Outcome Measure Help needed turning from your back to your side while in a flat bed without using bedrails?: A Lot Help needed moving from lying on your back  to sitting on the side of a flat bed without using bedrails?: A Lot Help needed moving to and from a bed to a chair (including a wheelchair)?: A Lot Help needed standing up from a chair using your arms (e.g., wheelchair or bedside chair)?: A Lot Help needed to walk in hospital room?: A Little Help needed climbing 3-5 steps with a railing? : A Lot 6 Click Score: 13    End of Session Equipment Utilized During Treatment: Oxygen Activity Tolerance: Patient tolerated treatment well Patient left: in chair;with call bell/phone within reach;with nursing/sitter in room Nurse Communication: Mobility status (RN present to assist during session) PT Visit Diagnosis: Unsteadiness on feet (R26.81);Repeated falls (R29.6);Difficulty in walking, not elsewhere classified (R26.2)    Time: 1610-9604 PT Time Calculation (min) (ACUTE ONLY): 28 min   Charges:   PT Evaluation $PT Eval Moderate Complexity: 1 Mod PT Treatments $Gait Training: 8-22 mins PT General Charges $$ ACUTE PT VISIT: 1 Visit         Lewis Shock, PT, DPT Acute Rehabilitation Services Secure chat preferred Office #: [phone]   Iona Hansen 04/26/2023, 1:10  PM

## 2023-04-26 NOTE — Plan of Care (Signed)
 Chart reviewed.  Patient very complicated with recurrent left pleural effusion, status post VATS and chest tube, pericardial effusion with tamponade physiology, hypotension, acute hypoxic respiratory failure requiring 6 L of oxygen and AKI.  Also patient in 2H unit.  Discussed case with PCCM and Dr. Myrla Halsted was gracious enough to take over this patient's care from hospital service.  We will be happy to assume care of this patient once he is medically more stable and ready for transfer to regular floors.  Appreciate PCCM help.

## 2023-04-26 NOTE — Op Note (Signed)
 Mitchell Frye MEDICAL RECORD NO: [id number] ACCOUNT NO: [id number] DATE OF BIRTH: 02-06-1951 FACILITY: [facility] LOCATION: [facility] PHYSICIAN: Salvatore Decent. Dorris Fetch, MD  Operative Report   DATE OF PROCEDURE: 04/25/2023  PREOPERATIVE DIAGNOSES: 1. Bilateral pleural effusions. 2. Pericardial effusion.  POSTOPERATIVE DIAGNOSES: 1. Bilateral pleural effusions. 2. Pericardial effusion.  PROCEDURE:  Right chest tube insertion,  Left video-assisted thoracoscopy,  Drainage of pleural effusion,  Decortication of left lung,  Pericardial window.  SURGEON: Salvatore Decent. Dorris Fetch, MD  ASSISTANT: Lowella Dandy, PA  Experienced assistance was necessary for this case due to surgical complexity.  Erin Barrett assisted with exposure, retraction, suctioning, camera management and wound closure.   ANESTHESIA: General.  FINDINGS: Serosanguineous fluid from right chest tube. Murky serous fluid in the left chest. Moderately large pericardial effusion, again serous.  Thickened pleural and pericardial peel throughout entire left chest, good re-expansion of lower lobe, incomplete re-expansion of upper lobe.  CLINICAL NOTE: Mitchell Frye is a 72 year old man with a history of stage IIIB adenocarcinoma of the lung with a recurrent left pleural effusion dating back many months. I had seen him in 12/2022 and recommended VATS for drainage of the effusion and decortication, but the patient refused at that time. He presents now with worsening shortness of breath and right-sided chest pain and pressure, found to have a stable large loculated left pleural effusion with a progression of a right pleural effusion and a pericardial effusion with early echocardiographic signs of tamponade. He was advised to undergo right chest tube placement and left VATS for drainage of pleural effusion, pericardial window, and decortication. The indications, risks, benefits, and  alternatives were discussed in detail with the patient. He  accepted the risks and agreed to proceed.  OPERATIVE NOTE: Mitchell Frye was brought to the operating room on 04/25/2023. He had induction of general anesthesia. He tolerated that well hemodynamically. Dr. Marcene Duos performed transesophageal echocardiography, which showed a large pericardial effusion with some diastolic collapse of the right ventricle. The effusion appeared circumferential. A Foley catheter was placed. Intravenous antibiotics were administered. The right chest was prepped and draped. A Finder needle was inserted and serosanguineous fluid was encountered. The modified Seldinger technique was used to place a pigtail catheter in the right chest. There was a small amount of bloody fluid, but no significant effusion. It was unclear if this was due to loculated  effusion or if the catheter was subdiaphragmatic. Decision was made to proceed with the more urgent pericardial window and decortication on the left side. The catheter was secured and connected to a Pleur-evac. There was no ongoing drainage  from that catheter during the remainder of the procedure.  It should be noted that sequential compression devices were placed at the time of intubation. Once placed into a right lateral decubitus position, a Bair Hugger was placed for active warming and the left chest was prepped and draped in the usual sterile fashion. An incision was made in the sixth interspace anterolaterally. It was carried through the skin and subcutaneous tissue. The latissimus was divided. The serratus was detached posteriorly and retracted anteriorly. The intercostal muscles were divided. There was a thickened parietal pleura.  The space was entered. The left pleural fluid was straw-colored and slightly murky, but not purulent. Space was freed up for an incision in the eighth interspace and a thoracoscope was advanced through  that incision into the chest. There was extensive visceral and parietal peel and fluid and  exudate within the left chest. The remainder of the  fluid was evacuated and portions of the peel were evacuated. Attention was then turned to the pericardium.   There was a thick peel over the pericardium. The pericardial peel was approximately 5-6 mm in thickness. The majority of the peel on the lung was 3 to 4 mm in thickness, although some areas were as thick as a centimeter. Cautery was used to incise the pericardium relatively anteriorly. When entered, there was a bloody effusion. This was non-clotting bloody fluid. A sucker was advanced into the pericardium and the effusion was completely drained. A window was created by excising a 2 x 2 cm area of the pericardium. Tissue was sent for pathology. A second smaller window was created more posteriorly in the pericardium. Attention was then turned to the left lung. There was an extensive peel covering the entire left lung. After developing a plane, for the most part the peel came off rather easily. In some areas, it was as thin as a millimeter, but over the majority of the lung, it was approximately 3 to 4 mm in thickness. In some areas, the peel was taken off in layers. The decortication was a slow and tedious process, but was able to be done with minimal pleural tears. Periodically, the left lung was allowed to ventilate. Over time, the vast majority of the lower lobe was decorticated. There were areas along the aorta and then anteriorly along the pericardium that were not able to be decorticated, but there was good re-expansion of the lower lobe. Even with visually good decortication of the upper lobe, there still was incomplete re-expansion of it. A 19-French Blake drain was placed through a separate stab incision and directed into the pericardium and then two 28-French chest tubes were placed into the left pleural space, one more anteriorly and one more posteriorly. All the tubes were secured with #1 silk sutures. Dual lung ventilation was resumed. The  incision was closed with a running #1 Vicryl suture for the muscular layers followed by 2-0 Vicryl subcutaneous suture and a 3-0 Vicryl subcuticular suture. The chest tubes were placed to suction. The patient then was placed back in the supine position. He was extubated in the operating room and taken to the postanesthesia care unit in good condition. All sponge, needle, and instrument counts were correct at the end of the procedure.    SUJ D: 04/26/2023 3:55:00 pm T: 04/26/2023 11:34:00 pm  JOB: [id number]/ [id number]

## 2023-04-26 NOTE — Plan of Care (Signed)
   Problem: Clinical Measurements: Goal: Respiratory complications will improve Outcome: Progressing   Problem: Activity: Goal: Risk for activity intolerance will decrease Outcome: Progressing

## 2023-04-27 ENCOUNTER — Inpatient Hospital Stay (HOSPITAL_COMMUNITY)

## 2023-04-27 DIAGNOSIS — E441 Mild protein-calorie malnutrition: Secondary | ICD-10-CM | POA: Diagnosis not present

## 2023-04-27 DIAGNOSIS — G9341 Metabolic encephalopathy: Secondary | ICD-10-CM | POA: Diagnosis not present

## 2023-04-27 DIAGNOSIS — J90 Pleural effusion, not elsewhere classified: Secondary | ICD-10-CM | POA: Diagnosis not present

## 2023-04-27 DIAGNOSIS — J9601 Acute respiratory failure with hypoxia: Secondary | ICD-10-CM | POA: Diagnosis not present

## 2023-04-27 LAB — ACID FAST SMEAR (AFB, MYCOBACTERIA): Acid Fast Smear: NEGATIVE

## 2023-04-27 LAB — COMPREHENSIVE METABOLIC PANEL
ALT: 14 U/L (ref 0–44)
AST: 25 U/L (ref 15–41)
Albumin: 3.1 g/dL — ABNORMAL LOW (ref 3.5–5.0)
Alkaline Phosphatase: 42 U/L (ref 38–126)
Anion gap: 9 (ref 5–15)
BUN: 16 mg/dL (ref 8–23)
CO2: 21 mmol/L — ABNORMAL LOW (ref 22–32)
Calcium: 8.5 mg/dL — ABNORMAL LOW (ref 8.9–10.3)
Chloride: 106 mmol/L (ref 98–111)
Creatinine, Ser: 1.27 mg/dL — ABNORMAL HIGH (ref 0.61–1.24)
GFR, Estimated: 60 mL/min — ABNORMAL LOW (ref >60)
Glucose, Bld: 113 mg/dL — ABNORMAL HIGH (ref 70–99)
Potassium: 4 mmol/L (ref 3.5–5.1)
Sodium: 136 mmol/L (ref 135–145)
Total Bilirubin: 1.6 mg/dL — ABNORMAL HIGH (ref 0.0–1.2)
Total Protein: 6.3 g/dL — ABNORMAL LOW (ref 6.5–8.1)

## 2023-04-27 LAB — CYTOLOGY - NON PAP

## 2023-04-27 LAB — CBC
HCT: 28.9 % — ABNORMAL LOW (ref 39.0–52.0)
Hemoglobin: 8.5 g/dL — ABNORMAL LOW (ref 13.0–17.0)
MCH: 22.4 pg — ABNORMAL LOW (ref 26.0–34.0)
MCHC: 29.4 g/dL — ABNORMAL LOW (ref 30.0–36.0)
MCV: 76.3 fL — ABNORMAL LOW (ref 80.0–100.0)
Platelets: 305 10*3/uL (ref 150–400)
RBC: 3.79 MIL/uL — ABNORMAL LOW (ref 4.22–5.81)
RDW: 19.6 % — ABNORMAL HIGH (ref 11.5–15.5)
WBC: 9.1 10*3/uL (ref 4.0–10.5)
nRBC: 0 % (ref 0.0–0.2)

## 2023-04-27 LAB — SURGICAL PATHOLOGY

## 2023-04-27 MED ORDER — ENOXAPARIN SODIUM 40 MG/0.4ML IJ SOSY
40.0000 mg | PREFILLED_SYRINGE | INTRAMUSCULAR | Status: DC
  Administered 2023-04-27 – 2023-05-02 (×6): 40 mg via SUBCUTANEOUS
  Filled 2023-04-27 (×6): qty 0.4

## 2023-04-27 NOTE — Progress Notes (Addendum)
 [facility] DAILY ICU PROGRESS NOTE                   [address]            [name] [address]          [phone]   2 Days Post-Op Procedure(s) (LRB): VIDEO ASSISTED THORACOSCOPY (VATS)/DECORTICATION (Left) DRAINAGE, PLEURAL EFFUSION (Bilateral) DRAINAGE, PERICARDIAL EFFUSION (N/A) RIGHT PLEURAL CHEST TUBE INSERTION (Right) CREATION, PERICARDIAL WINDOW (N/A)  Total Length of Stay:  LOS: 4 days   Subjective: Sitting up in bed, awake and alert. Says he is breathing OK and denies pain.   O2 at 2L/[place], sat 95%.  Objective: Vital signs in last 24 hours: Temp:  [97.7 F (36.5 C)-98.1 F (36.7 C)] 98.1 F (36.7 C) (03/13 0749) Pulse Rate:  [84-105] 90 (03/13 0600) Cardiac Rhythm: Normal sinus rhythm (03/13 0310) Resp:  [11-27] 21 (03/13 0600) BP: (91-137)/(58-82) 110/75 (03/13 0600) SpO2:  [85 %-100 %] 98 % (03/13 0600) Arterial Line BP: (89-149)/(47-81) 127/56 (03/13 0600) Weight:  [81.9 kg] 81.9 kg (03/13 0622)  Filed Weights   04/25/23 2200 04/26/23 0554 04/27/23 0622  Weight: 82.9 kg 81.3 kg 81.9 kg    Weight change: -1 kg     Intake/Output from previous day: 03/12 0701 - 03/13 0700 In: 1686 [P.O.:480; I.V.:792.5; IV Piggyback:413.5] Out: 1162 [Urine:485; Chest Tube:677]   Current Meds: Scheduled Meds:  acetaminophen  1,000 mg Oral Q6H   Or   acetaminophen (TYLENOL) oral liquid 160 mg/5 mL  1,000 mg Oral Q6H   bisacodyl  10 mg Oral Daily   Chlorhexidine Gluconate Cloth  6 each Topical Daily   metoCLOPramide (REGLAN) injection  10 mg Intravenous Q6H   pantoprazole  40 mg Oral Daily   senna-docusate  1 tablet Oral QHS   sodium chloride flush  3 mL Intravenous Q12H   Continuous Infusions:  sodium chloride 75 mL/hr at 04/27/23 0449   PRN Meds:.guaiFENesin, hydrOXYzine, LORazepam, morphine injection, ondansetron **OR** ondansetron (ZOFRAN) IV, mouth rinse, oxyCODONE, phenol, polyethylene glycol  General appearance: alert, cooperative, and no  distress Neurologic: intact Heart: SR / ST Lungs: normal work of breathing on 2Lnc.  Small air leak from the pleural tube. Drainage moderate. Abdomen: firm, non-tender Wound: the left chest incisions and CT sites are dry.  Lab Results: CBC: Recent Labs    04/26/23 0425 04/27/23 0325  WBC 11.2* 9.1  HGB 9.6* 8.5*  HCT 31.8* 28.9*  PLT 352 305   BMET:  Recent Labs    04/26/23 0425 04/27/23 0325  NA 139 136  K 4.2 4.0  CL 110 106  CO2 24 21*  GLUCOSE 102* 113*  BUN 14 16  CREATININE 1.41* 1.27*  CALCIUM 8.4* 8.5*    CMET: Lab Results  Component Value Date   WBC 9.1 04/27/2023   HGB 8.5 (L) 04/27/2023   HCT 28.9 (L) 04/27/2023   PLT 305 04/27/2023   GLUCOSE 113 (H) 04/27/2023   ALT 14 04/27/2023   AST 25 04/27/2023   NA 136 04/27/2023   K 4.0 04/27/2023   CL 106 04/27/2023   CREATININE 1.27 (H) 04/27/2023   BUN 16 04/27/2023   CO2 21 (L) 04/27/2023   TSH 1.645 01/14/2022   INR 1.3 (H) 04/25/2023   HGBA1C 5.9 (H) 04/26/2023      PT/INR:  Recent Labs    04/25/23 1035  LABPROT 16.8*  INR 1.3*   Radiology: No results found.   Assessment/Plan: S/P Procedure(s) (LRB):  VIDEO ASSISTED THORACOSCOPY (VATS)/DECORTICATION (Left) DRAINAGE, PLEURAL EFFUSION (Bilateral) DRAINAGE, PERICARDIAL EFFUSION (N/A) RIGHT PLEURAL CHEST TUBE INSERTION (Right) CREATION, PERICARDIAL WINDOW (N/A)  -POD2 left VATS for drainage of recurrent pleural effusion, pleural decortication, and pericardial window for large pericardial effusion with early tamponade. Stable respiratory status.  Pt has stage IIIB adenocarcinoma of the lung. The pleural and pericardial drainage is moderate but tapering off and there is a small air leak from the pleural tubes so will leave all of the drains in place.  Will d/c the arterial line and mobilize. OK to transfer to [facility]. Begin diuresis with Lasix 20mg  IV x 1 today.   -HEME- expected ABL anemia: mild and tolerating well. Monitor.   -History of  HTN- BP well controlled with no medications currently  -DVT PPX- on daily enoxaparin.    Leary Roca, PA-C 04/27/2023 7:58 AM Patient seen and examined, agree with assessment and plan outlined above  Viviann Spare C. Dorris Fetch, MD [facility] [phone]

## 2023-04-27 NOTE — Evaluation (Signed)
 Occupational Therapy Evaluation Patient Details Name: Mitchell Frye MRN: [id number] DOB: April 17, 1950 Today's Date: 04/27/2023   History of Present Illness   Mitchell Frye is a 73 year old male who presented to ED on 3/9 with SOB and R chest pain. Pt underwent L VATS procedure due to recurrent L pleural effusion. PMH:HTN, HLD, stage IIIb adenocarcinoma of the lung and a recurrent left pleural effusion causing chronic dyspnea.     Clinical Impressions At baseline, pt is Independent with ADLs and functional mobility without an AD. Pt now presents with decreased activity tolerance, increased fatigue, impaired cardiopulmonary status, decreased balance, generalized B UE weakness, decreased cognition, and decreased safety and independence with functional tasks. Pt currently demonstrates ability to complete UB ADLs with Set up to Contact guard assist, LB ADLs with Mod to Max assist, and functional transfers with a RW with Contact guard to Min assist +2 for line/equipment management. Pt with HR in the 90s to 110s and O2 sat largely >/92% on 2L continuous O2 through nasal cannula throughout session. Pt with one brief O2 sat drop to 88% on 2L during transfer but with poor pleth noted at that time and O2 sat 93% upon sitting. Pt participated well in session and is motivated to increase independence. Pt will benefit from acute skilled OT services to address deficits outlined below and to increase safety and independence with functional tasks. Post acute discharge, pt will benefit from continued skilled OT services in the home to maximize rehab potential paired with assist of family.      If plan is discharge home, recommend the following:   Two people to help with walking and/or transfers;A lot of help with bathing/dressing/bathroom;Assistance with cooking/housework;Assist for transportation;Help with stairs or ramp for entrance     Functional Status Assessment   Patient has had a recent decline in their  functional status and demonstrates the ability to make significant improvements in function in a reasonable and predictable amount of time.     Equipment Recommendations   BSC/3in1;Tub/shower bench     Recommendations for Other Services         Precautions/Restrictions   Precautions Precautions: Fall Restrictions Weight Bearing Restrictions Per Provider Order: No     Mobility Bed Mobility Overal bed mobility: Needs Assistance Bed Mobility: Supine to Sit     Supine to sit: Contact guard, HOB elevated     General bed mobility comments: with increased time    Transfers Overall transfer level: Needs assistance Equipment used: Rolling walker (2 wheels), 1 person hand held assist (+2 for line/equipment management) Transfers: Sit to/from Stand, Bed to chair/wheelchair/BSC Sit to Stand: Contact guard assist, +2 safety/equipment (+1 HH physical assist to power up)     Step pivot transfers: Contact guard assist, +2 safety/equipment (with RW)     General transfer comment: Pt fatiguing quickly and attempting to sit x2 prior to being in proper position. Pt able to self correct with verbal cues.      Balance Overall balance assessment: Needs assistance Sitting-balance support: No upper extremity supported, Feet supported Sitting balance-Leahy Scale: Fair     Standing balance support: Bilateral upper extremity supported, During functional activity, Reliant on assistive device for balance Standing balance-Leahy Scale: Poor Standing balance comment: reliant on external support                           ADL either performed or assessed with clinical judgement   ADL Overall ADL's : Needs  assistance/impaired Eating/Feeding: Set up;Sitting   Grooming: Set up;Sitting   Upper Body Bathing: Contact guard assist;Sitting   Lower Body Bathing: Moderate assistance;Maximal assistance;Sitting/lateral leans;Sit to/from stand;Cueing for compensatory techniques    Upper Body Dressing : Contact guard assist;Sitting   Lower Body Dressing: Moderate assistance;Maximal assistance;Cueing for compensatory techniques;Sitting/lateral leans;Sit to/from stand   Toilet Transfer: Contact guard assist;Minimal assistance;+2 for safety/equipment;BSC/3in1;Rolling walker (2 wheels) (step-pivot)   Toileting- Clothing Manipulation and Hygiene: Maximal assistance;Sit to/from stand Toileting - Clothing Manipulation Details (indicate cue type and reason): for peri-care       General ADL Comments: Pt with dereased activity tolerance, fatiguing quickly during tasks, and with increased labor of breathing with activity.     Vision Baseline Vision/History: 1 Wears glasses (readers) Ability to See in Adequate Light: 0 Adequate Patient Visual Report: No change from baseline       Perception         Praxis         Pertinent Vitals/Pain Pain Assessment Pain Assessment: No/denies pain Pain Intervention(s): Monitored during session     Extremity/Trunk Assessment Upper Extremity Assessment Upper Extremity Assessment: Left hand dominant;Generalized weakness   Lower Extremity Assessment Lower Extremity Assessment: Defer to PT evaluation   Cervical / Trunk Assessment Cervical / Trunk Assessment: Other exceptions Cervical / Trunk Exceptions: multiple chest tubes   Communication Communication Communication: No apparent difficulties Factors Affecting Communication: Other (comment) (soft spoken)   Cognition Arousal: Alert Behavior During Therapy: WFL for tasks assessed/performed Cognition: No family/caregiver present to determine baseline, Cognition impaired   Orientation impairments: Time Awareness: Intellectual awareness intact, Online awareness impaired Memory impairment (select all impairments): Declarative long-term memory, Working memory Attention impairment (select first level of impairment): Alternating attention Executive functioning impairment  (select all impairments): Problem solving, Organization OT - Cognition Comments: Pt oriented to self, place, and situation and plesant throughout session. Pt with occasional impulsiveness with movement, attempting to sit x2 prior to being in proper position.                 Following commands: Intact       Cueing  General Comments   Cueing Techniques: Verbal cues;Tactile cues  Pt with HR in the 90s to 110s and O2 sat largely >/92% on 2L continuous O2 through nasal cannula throughout session. Pt with one brief O2 sat drop to 88% on 2L during transfer but with poor pleth noted at that time and O2 sat 93% upon sitting. NT present during a portion of session.    Exercises     Shoulder Instructions      Home Living Family/patient expects to be discharged to:: Private residence Living Arrangements: Other relatives (brother and sister) Available Help at Discharge: Family;Available 24 hours/day Type of Home: House Home Access: Stairs to enter [employer] of Steps: 3 Entrance Stairs-Rails: None Home Layout: One level     Bathroom Shower/Tub: Chief Strategy Officer: Standard     Home Equipment: Agricultural consultant (2 wheels)          Prior Functioning/Environment Prior Level of Function : Independent/Modified Independent             Mobility Comments: pt reports indep without AD however lives sedentary life and listens to music or watches tv all day ADLs Comments: Independent    OT Problem List: Decreased strength;Decreased activity tolerance;Impaired balance (sitting and/or standing);Decreased knowledge of use of DME or AE;Cardiopulmonary status limiting activity   OT Treatment/Interventions: Self-care/ADL training;Energy conservation;DME and/or AE instruction;Therapeutic activities;Patient/family  education;Balance training      OT Goals(Current goals can be found in the care plan section)   Acute Rehab OT Goals Patient Stated Goal: to return  home and be able to get some sleep OT Goal Formulation: With patient Time For Goal Achievement: 05/11/23 Potential to Achieve Goals: Good ADL Goals Pt Will Perform Lower Body Bathing: sit to/from stand;sitting/lateral leans;with contact guard assist (with adaptive equipment as needed) Pt Will Perform Lower Body Dressing: with contact guard assist;sitting/lateral leans;sit to/from stand (with adaptive equipment as needed) Pt Will Transfer to Toilet: with supervision;ambulating;bedside commode (with least restrictive AD) Pt Will Perform Toileting - Clothing Manipulation and hygiene: with contact guard assist;sitting/lateral leans;sit to/from stand Additional ADL Goal #1: Patient will demonstrate ability to Independently state 3 energy conservation techniques to increase quality of life and safety and independence with functional tasks.   OT Frequency:  Min 2X/week    Co-evaluation              AM-PAC OT "6 Clicks" Daily Activity     Outcome Measure Help from another person eating meals?: A Little Help from another person taking care of personal grooming?: A Little Help from another person toileting, which includes using toliet, bedpan, or urinal?: A Lot Help from another person bathing (including washing, rinsing, drying)?: A Lot Help from another person to put on and taking off regular upper body clothing?: A Little Help from another person to put on and taking off regular lower body clothing?: A Lot 6 Click Score: 15   End of Session Equipment Utilized During Treatment: Rolling walker (2 wheels);Oxygen;Other (comment) [facility]) Nurse Communication: Mobility status;Other (comment) (Vital signs; pt sitting in recliner)  Activity Tolerance: Patient tolerated treatment well;Patient limited by fatigue;Treatment limited secondary to medical complications (Comment) (limited by increased labor of breathing with activity) Patient left: in chair;with call bell/phone within reach  OT Visit  Diagnosis: Unsteadiness on feet (R26.81);Other abnormalities of gait and mobility (R26.89);Muscle weakness (generalized) (M62.81);Other (comment) (decreased activity tolerance)                Time: 1610-9604 OT Time Calculation (min): 26 min Charges:  OT General Charges $OT Visit: 1 Visit OT Evaluation $OT Eval Moderate Complexity: 1 Mod OT Treatments $Self Care/Home Management : 8-22 mins  Izrael Peak "Orson Eva., OTR/L, MA Acute Rehab [phone]  Lendon Colonel 04/27/2023, 12:40 PM

## 2023-04-27 NOTE — Progress Notes (Signed)
   NAME:  Mitchell Frye, MRN:  [id number], DOB:  09/18/1950, LOS: 4 ADMISSION DATE:  04/23/2023, CONSULTATION DATE:  04/26/23 REFERRING MD:  TRH, CHIEF COMPLAINT:  SOB   History of Present Illness:  73 year old man w/ hx of left lung [name] adeno s/p chemoradiation thought in remission who has been having issues with recurrent left effusion presenting with worsening chest pain and SOB.  Workup revealed again a large loculated left effusion but also an enlarging pericardial effusion with tamponade features.  Therefore patient was taken to operating room by Dr. Dorris Fetch for L VATS and pericardial effusion 3/11.  Postop course complicated by delayed emergence from anesthesia and some confusion.  PCCM to take over as patient was TRH pre-op.  Patient currently is tired, does not really want to eat, oriented to self + place but not date.  Pertinent  Medical History  Stage 3 NSCLC ? In remission vs. Recurrence, path of fluid pending HTN HLD  Significant Hospital Events: Including procedures, antibiotic start and stop dates in addition to other pertinent events   3/9 admit 3/11 VATS+pericardial window  Interim History / Subjective:  No events, more awake.  Objective   Blood pressure 110/75, pulse 90, temperature 98.1 F (36.7 C), temperature source Oral, resp. rate (!) 21, height 5\' 10"  (1.778 m), weight 81.9 kg, SpO2 98%.        Intake/Output Summary (Last 24 hours) at 04/27/2023 0802 Last data filed at 04/27/2023 0600 Gross per 24 hour  Intake 1686 ml  Output 1162 ml  Net 524 ml   Filed Weights   04/25/23 2200 04/26/23 0554 04/27/23 0622  Weight: 82.9 kg 81.3 kg 81.9 kg    Examination: No distress Poor dentition Port in place Lung sounds improved on R Mediastinal drain 37 out L pleural 640  Lung mostly up on CXR, R lung more clear small area of loculation along R lateral wall Sats good on 2LPM Cr improved Resolved Hospital Problem list   N/A  Assessment & Plan:  Acute  hypoxemic respiratory failure secondary to recurrent pleural effusion and acute on chronic pericardial effusion with signs of tamponade pre-op- now s/p left VATS w decortication as well as percardial window creation 3/11 [name] L lung cancer s/p chemoradiation- in remission and cyto from effusion neg from prior thora; new path pending from intra-op R and L pleural effusions- both with tubes in place now Mild protein calorie malnutrition POA Metabolic encephalopathy postop- hopefully improves with time, reorientation Postop mild AKI- getting IVF  - Chest and mediastinal drains per TCTS - Hold further IVF - Encourage IS, wean O2 for sats > 90% - f/u intra-op pleural fluid/peel, pericardial cyto - advance diet as tolerated: now on regular - remains in ICU while having pericardial window - looking better  Myrla Halsted MD PCCM

## 2023-04-27 NOTE — Plan of Care (Signed)

## 2023-04-27 NOTE — Plan of Care (Signed)
   Problem: Clinical Measurements: Goal: Respiratory complications will improve Outcome: Progressing Goal: Cardiovascular complication will be avoided Outcome: Progressing

## 2023-04-28 ENCOUNTER — Inpatient Hospital Stay (HOSPITAL_COMMUNITY)

## 2023-04-28 DIAGNOSIS — C3432 Malignant neoplasm of lower lobe, left bronchus or lung: Secondary | ICD-10-CM | POA: Diagnosis not present

## 2023-04-28 DIAGNOSIS — R0789 Other chest pain: Secondary | ICD-10-CM | POA: Diagnosis not present

## 2023-04-28 DIAGNOSIS — E440 Moderate protein-calorie malnutrition: Secondary | ICD-10-CM | POA: Insufficient documentation

## 2023-04-28 DIAGNOSIS — J90 Pleural effusion, not elsewhere classified: Secondary | ICD-10-CM | POA: Diagnosis not present

## 2023-04-28 DIAGNOSIS — E876 Hypokalemia: Secondary | ICD-10-CM | POA: Diagnosis not present

## 2023-04-28 DIAGNOSIS — I3139 Other pericardial effusion (noninflammatory): Secondary | ICD-10-CM

## 2023-04-28 LAB — FERRITIN: Ferritin: 498 ng/mL — ABNORMAL HIGH (ref 24–336)

## 2023-04-28 LAB — VITAMIN B12: Vitamin B-12: 859 pg/mL (ref 180–914)

## 2023-04-28 LAB — IRON AND TIBC
Iron: 11 ug/dL — ABNORMAL LOW (ref 45–182)
Saturation Ratios: 9 % — ABNORMAL LOW (ref 17.9–39.5)
TIBC: 118 ug/dL — ABNORMAL LOW (ref 250–450)
UIBC: 107 ug/dL

## 2023-04-28 LAB — RENAL FUNCTION PANEL
Albumin: 2.3 g/dL — ABNORMAL LOW (ref 3.5–5.0)
Anion gap: 7 (ref 5–15)
BUN: 13 mg/dL (ref 8–23)
CO2: 24 mmol/L (ref 22–32)
Calcium: 8.3 mg/dL — ABNORMAL LOW (ref 8.9–10.3)
Chloride: 108 mmol/L (ref 98–111)
Creatinine, Ser: 1.14 mg/dL (ref 0.61–1.24)
GFR, Estimated: >60 mL/min (ref >60)
Glucose, Bld: 91 mg/dL (ref 70–99)
Phosphorus: 2.5 mg/dL (ref 2.5–4.6)
Potassium: 3.7 mmol/L (ref 3.5–5.1)
Sodium: 139 mmol/L (ref 135–145)

## 2023-04-28 LAB — RETICULOCYTES
Immature Retic Fract: 14.4 % (ref 2.3–15.9)
RBC.: 3.83 MIL/uL — ABNORMAL LOW (ref 4.22–5.81)
Retic Count, Absolute: 40.6 10*3/uL (ref 19.0–186.0)
Retic Ct Pct: 1.1 % (ref 0.4–3.1)

## 2023-04-28 LAB — CBC
HCT: 28.9 % — ABNORMAL LOW (ref 39.0–52.0)
Hemoglobin: 8.7 g/dL — ABNORMAL LOW (ref 13.0–17.0)
MCH: 22.7 pg — ABNORMAL LOW (ref 26.0–34.0)
MCHC: 30.1 g/dL (ref 30.0–36.0)
MCV: 75.5 fL — ABNORMAL LOW (ref 80.0–100.0)
Platelets: 360 10*3/uL (ref 150–400)
RBC: 3.83 MIL/uL — ABNORMAL LOW (ref 4.22–5.81)
RDW: 19.6 % — ABNORMAL HIGH (ref 11.5–15.5)
WBC: 7.6 10*3/uL (ref 4.0–10.5)
nRBC: 0 % (ref 0.0–0.2)

## 2023-04-28 LAB — MAGNESIUM: Magnesium: 2.1 mg/dL (ref 1.7–2.4)

## 2023-04-28 LAB — FOLATE: Folate: 10.8 ng/mL (ref >5.9)

## 2023-04-28 MED ORDER — IPRATROPIUM-ALBUTEROL 0.5-2.5 (3) MG/3ML IN SOLN
RESPIRATORY_TRACT | Status: AC
  Administered 2023-04-28: 3 mL
  Filled 2023-04-28: qty 3

## 2023-04-28 MED ORDER — ENSURE ENLIVE PO LIQD
237.0000 mL | Freq: Two times a day (BID) | ORAL | Status: DC
  Administered 2023-04-28 – 2023-04-30 (×3): 237 mL via ORAL

## 2023-04-28 MED ORDER — ENSURE ENLIVE PO LIQD: 237.0000 mL | Freq: Three times a day (TID) | ORAL | Status: DC

## 2023-04-28 MED FILL — Heparin Sodium (Porcine) Inj 1000 Unit/ML: Qty: 1000 | Status: AC

## 2023-04-28 MED FILL — Heparin Sodium (Porcine) Inj 1000 Unit/ML: INTRAMUSCULAR | Qty: 2500 | Status: AC

## 2023-04-28 MED FILL — Potassium Chloride Inj 2 mEq/ML: INTRAVENOUS | Qty: 40 | Status: AC

## 2023-04-28 MED FILL — Lidocaine HCl Local Preservative Free (PF) Inj 2%: INTRAMUSCULAR | Qty: 14 | Status: AC

## 2023-04-28 NOTE — Plan of Care (Signed)

## 2023-04-28 NOTE — Progress Notes (Signed)
 PROGRESS NOTE  Case Vassell SWF:[id number] DOB: 1950-07-25   PCP: [facility]  Patient is from: Home.  DOA: 04/23/2023 LOS: 5  Chief complaints No chief complaint on file.    Brief Narrative / Interim history: 73 year old M with PMH of stage IIIb NSCLC s/p chemoradiation in 2021 and palliative chemotherapy and radiation in 2023, recurrent left pleural effusion, hypertension and hyperlipidemia presented to [facility] ED on 3/9 with chest pain, progressive shortness of breath and chronic cough.  In ED, CXR revealed stable moderate size loculated left pleural effusion.  Cardiothoracic surgery consulted and patient was transferred to [facility].  CT chest without contrast showed increased small to moderate pericardial effusion and grossly stable moderate to large left pleural effusion with possible pleural thickening.   Patient had left VATS with decortication and pericardial window on 3/11.  Postoperatively, delayed emergence from anesthesia and confusion and transferred to ICU.  Patient also had right thoracocentesis with removal of 300 cc bloody appearing fluid by PCCM on 3/14.  Cardiothoracic surgery following.   Subjective: Seen and examined earlier this morning.  No major events overnight of this morning.  No complaints.  He denies chest pain, shortness of breath, GI or UTI symptoms.  Per RN, walked in the hallway this morning.  Objective: Vitals:   04/28/23 0600 04/28/23 0700 04/28/23 0730 04/28/23 0800  BP: 95/65 116/78  98/67  Pulse: 85 95  93  Resp: 19 (!) 21  17  Temp:   (!) 97.5 F (36.4 C)   TempSrc:   Axillary   SpO2: 100% 100%  97%  Weight: 81.3 kg     Height:        Examination:  GENERAL: No apparent distress.  Nontoxic. HEENT: MMM.  Vision and hearing grossly intact.  NECK: Supple.  No apparent JVD.  RESP:  No IWOB.  Fair aeration bilaterally.  Left chest tube.  Pericardial drain. CVS:  RRR. Heart sounds normal.  ABD/GI/GU: BS+. Abd soft, NTND.   MSK/EXT:  Moves extremities. No apparent deformity. No edema.  SKIN: no apparent skin lesion or wound NEURO: Awake, alert and oriented appropriately.  No apparent focal neuro deficit. PSYCH: Calm. Normal affect.   Procedures:  3/11-VATS/decortication for left pleural effusion.  Pericardial window for pericardial effusion 3/14-thoracocentesis for right pleural effusion  Microbiology summarized: 3/11-MRSA PCR screen negative 3/11-left pleural fluid culture NGTD. 3/11-left pleural fluid AFS negative.  Culture pending.  Assessment and plan: Acute hypoxemic respiratory failure due to recurrent malignant pleural effusion  Bilateral pleural effusion  Pericardial effusion with tamponade Stage IIIb NSCLC s/p chemoradiation in 2021 and palliative chemotherapy and radiation in 2023 -S/p VATS/decortication and chest tube placement for left pleural effusion on 3/11 -S/p pericardial window/drain on 3/11 -S/p right thoracocentesis with removal of 300 cc bloody pleural effusion on 3/14 -Left pleural fluid cultures NGTD.  Cytology negative for malignant cells but visceral peel pathology with NSCLC -Cardiothoracic surgery on board.  Plan for pericardial drain removal.  Chest tube to waterseal -Incentive spirometry, OOB, PT/OT -Wean oxygen.  Ambulatory saturation. -Pain control  Anemia of chronic disease: Hgb dropped about 2 g since admission.  Likely surgical blood loss.  Right hemithorax? Recent Labs    08/05/22 0955 08/14/22 1555 12/05/22 0945 04/11/23 1430 04/23/23 1208 04/24/23 0155 04/25/23 0212 04/25/23 1035 04/26/23 0425 04/27/23 0325  HGB 11.8* 12.6* 8.8* 9.1* 10.8* 9.4* 9.1* 9.7* 9.6* 8.5*  -Monitor CBC. -Check anemia panel.   Postoperative encephalopathy/confusion: Due to anesthesia?  Resolved.  Oriented x 4.  -  Reorientation and delirium precaution  AKI: Baseline Cr ~1.1.  Improving. Recent Labs    08/05/22 0955 08/14/22 1555 12/05/22 0945 04/23/23 1208 04/24/23 0155  04/25/23 0212 04/25/23 1035 04/26/23 0425 04/27/23 0325  BUN 18 16 14 16 13 15 16 14 16   CREATININE 1.24 1.20 1.13 1.12 1.12 1.21 1.42* 1.41* 1.27*  -Avoid nephrotoxic meds -Continue monitoring  Physical deconditioning -PT/OT-recommended HH PT/OT/rolling walker.  Body mass index is 25.73 kg/m. Consult dietitian         DVT prophylaxis:  enoxaparin (LOVENOX) injection 40 mg Start: 04/27/23 0900  Code Status: Full code Family Communication: None at bedside Level of care: Progressive Status is: Inpatient Remains inpatient appropriate because: Recurrent malignant pleural effusion, pericardial effusion and physical deconditioning   Final disposition: Home with home health Consultants:  Cardiothoracic surgery Critical care  55 minutes with more than 50% spent in reviewing records, counseling patient/family and coordinating care.   Sch Meds:  Scheduled Meds:  acetaminophen  1,000 mg Oral Q6H   Or   acetaminophen (TYLENOL) oral liquid 160 mg/5 mL  1,000 mg Oral Q6H   bisacodyl  10 mg Oral Daily   Chlorhexidine Gluconate Cloth  6 each Topical Daily   enoxaparin (LOVENOX) injection  40 mg Subcutaneous Q24H   metoCLOPramide (REGLAN) injection  10 mg Intravenous Q6H   pantoprazole  40 mg Oral Daily   senna-docusate  1 tablet Oral QHS   sodium chloride flush  3 mL Intravenous Q12H   Continuous Infusions: PRN Meds:.guaiFENesin, hydrOXYzine, LORazepam, morphine injection, ondansetron **OR** ondansetron (ZOFRAN) IV, mouth rinse, oxyCODONE, phenol, polyethylene glycol  Antimicrobials: Anti-infectives (From admission, onward)    Start     Dose/Rate Route Frequency Ordered Stop   04/26/23 0100  ceFAZolin (ANCEF) IVPB 2g/100 mL premix        2 g 200 mL/hr over 30 Minutes Intravenous Every 8 hours 04/25/23 2138 04/27/23 1334   04/25/23 1430  vancomycin (VANCOREADY) IVPB 1250 mg/250 mL  Status:  Discontinued        1,250 mg 166.7 mL/hr over 90 Minutes Intravenous To Surgery  04/25/23 1425 04/25/23 2138   04/25/23 1430  ceFAZolin (ANCEF) IVPB 2g/100 mL premix        2 g 200 mL/hr over 30 Minutes Intravenous To Surgery 04/25/23 1425 04/25/23 1655   04/25/23 1430  ceFAZolin (ANCEF) IVPB 2g/100 mL premix  Status:  Discontinued        2 g 200 mL/hr over 30 Minutes Intravenous To Surgery 04/25/23 1425 04/25/23 2138   04/25/23 0933  ceFAZolin (ANCEF) IVPB 2g/100 mL premix  Status:  Discontinued        2 g 200 mL/hr over 30 Minutes Intravenous 30 min pre-op 04/25/23 0934 04/25/23 2138        I have personally reviewed the following labs and images: CBC: Recent Labs  Lab 04/24/23 0155 04/25/23 0212 04/25/23 1035 04/26/23 0425 04/27/23 0325  WBC 6.7 6.2 6.7 11.2* 9.1  NEUTROABS  --  4.6  --  9.6*  --   HGB 9.4* 9.1* 9.7* 9.6* 8.5*  HCT 32.1* 30.9* 33.3* 31.8* 28.9*  MCV 76.4* 76.5* 76.7* 75.7* 76.3*  PLT 363 329 371 352 305   BMP &GFR Recent Labs  Lab 04/24/23 0155 04/25/23 0212 04/25/23 1035 04/26/23 0425 04/27/23 0325  NA 144 142 141 139 136  K 3.7 3.7 3.9 4.2 4.0  CL 111 110 109 110 106  CO2 24 25 25 24  21*  GLUCOSE 108* 114* 108* 102* 113*  BUN 13 15 16 14 16   CREATININE 1.12 1.21 1.42* 1.41* 1.27*  CALCIUM 8.8* 8.7* 8.9 8.4* 8.5*  MG 2.2 2.0  --  1.7  --   PHOS  --  3.0  --  3.2  --    Estimated Creatinine Clearance: 53.5 mL/min (A) (by C-G formula based on SCr of 1.27 mg/dL (H)). Liver & Pancreas: Recent Labs  Lab 04/25/23 0212 04/25/23 1035 04/26/23 0425 04/27/23 0325  AST 18 19 35 25  ALT 15 15 19 14   ALKPHOS 50 57 47 42  BILITOT 0.9 1.0 1.3* 1.6*  PROT 6.7 7.2 6.0* 6.3*  ALBUMIN 2.2* 2.4* 2.0* 3.1*   No results for input(s): "LIPASE", "AMYLASE" in the last 168 hours. No results for input(s): "AMMONIA" in the last 168 hours. Diabetic: Recent Labs    04/26/23 0425  HGBA1C 5.9*   Recent Labs  Lab 04/25/23 2200  GLUCAP 114*   Cardiac Enzymes: No results for input(s): "CKTOTAL", "CKMB", "CKMBINDEX", "TROPONINI" in  the last 168 hours. No results for input(s): "PROBNP" in the last 8760 hours. Coagulation Profile: Recent Labs  Lab 04/25/23 1035  INR 1.3*   Thyroid Function Tests: No results for input(s): "TSH", "T4TOTAL", "FREET4", "T3FREE", "THYROIDAB" in the last 72 hours. Lipid Profile: No results for input(s): "CHOL", "HDL", "LDLCALC", "TRIG", "CHOLHDL", "LDLDIRECT" in the last 72 hours. Anemia Panel: No results for input(s): "VITAMINB12", "FOLATE", "FERRITIN", "TIBC", "IRON", "RETICCTPCT" in the last 72 hours. Urine analysis: No results found for: "COLORURINE", "APPEARANCEUR", "LABSPEC", "PHURINE", "GLUCOSEU", "HGBUR", "BILIRUBINUR", "KETONESUR", "PROTEINUR", "UROBILINOGEN", "NITRITE", "LEUKOCYTESUR" Sepsis Labs: Invalid input(s): "PROCALCITONIN", "LACTICIDVEN"  Microbiology: Recent Results (from the past 240 hours)  Surgical pcr screen     Status: None   Collection Time: 04/25/23 11:04 AM   Specimen: Nasal Mucosa; Nasal Swab  Result Value Ref Range Status   MRSA, PCR NEGATIVE NEGATIVE Final   Staphylococcus aureus NEGATIVE NEGATIVE Final    Comment: (NOTE) The Xpert SA Assay (FDA approved for NASAL specimens in patients 74 years of age and older), is one component of a comprehensive surveillance program. It is not intended to diagnose infection nor to guide or monitor treatment. Performed at [facility], [address], [place], [place] [address]   Aerobic/Anaerobic Culture w Gram Stain (surgical/deep wound)     Status: None (Preliminary result)   Collection Time: 04/25/23  5:41 PM   Specimen: PATH Soft tissue  Result Value Ref Range Status   Specimen Description TISSUE LEFT PLEURAL  Final   Special Requests PEEL  Final   Gram Stain NO WBC SEEN NO ORGANISMS SEEN   Final   Culture   Final    NO GROWTH 3 DAYS NO ANAEROBES ISOLATED; CULTURE IN PROGRESS FOR 5 DAYS Performed at [facility], [address] [place], [place] [address]    Report Status PENDING   Incomplete  Acid Fast Smear (AFB)     Status: None   Collection Time: 04/25/23  5:41 PM   Specimen: PATH Soft tissue  Result Value Ref Range Status   AFB Specimen Processing Concentration  Final   Acid Fast Smear Negative  Final    Comment: (NOTE) Performed At: [facility] [address] [place], [place] [id number] Jolene Schimke MD WG:[id number]    Source (AFB) TISSUE  Final    Comment: LEFT PLEURAL PEEL Performed at [facility], [address] [place], [place] [address]     Radiology Studies: No results found.  Jessika Rothery T. Linnea Todisco [facility]  If 7PM-7AM, please contact night-coverage [email or web address] 04/28/2023, 10:13 AM

## 2023-04-28 NOTE — Progress Notes (Signed)
 Initial Nutrition Assessment  DOCUMENTATION CODES:   Non-severe (moderate) malnutrition in context of chronic illness  INTERVENTION:   Ensure Enlive po BID, each supplement provides 350 kcal and 20 grams of protein.  NUTRITION DIAGNOSIS:   Moderate Malnutrition related to chronic illness as evidenced by mild muscle depletion, mild fat depletion, moderate muscle depletion.  GOAL:   Patient will meet greater than or equal to 90% of their needs  MONITOR:   PO intake, Supplement acceptance, Labs, Weight trends  REASON FOR ASSESSMENT:   Consult Assessment of nutrition requirement/status  ASSESSMENT:   73 yo admitted with acute respiratory failure due to recurrent malignant pleural effusion. PMH includes stage 3b NSCLC s/p chemoradiation in 2021 and palliative chemotherapy and radiation, recurrent left pleural effusion, HTN and HLD  3/09 Admitted 3/11 L VATS with decortication, pericardial window 3/14 Thoracentesis with 300 mL  Recorded po intake 0-100% of meals. Pt ate 50% at breakfast, 75% at dinner  Unable to obtain much of a nutrition history from patient at this time. Pt does indicate he has a good appetite and is eating well. Pt is agreeable to ensure type supplement.   Current wt 81.3 kg; wt relatively stable. Unclear wt trend but does not appear to have lost wt recently  Labs: reviewed Meds: dulcolax, reglan, senna-docusate   NUTRITION - FOCUSED PHYSICAL EXAM:  Flowsheet Row Most Recent Value  Orbital Region Mild depletion  Upper Arm Region Mild depletion  Thoracic and Lumbar Region Mild depletion  Buccal Region Mild depletion  Temple Region Moderate depletion  Clavicle Bone Region Moderate depletion  Clavicle and Acromion Bone Region Moderate depletion  Scapular Bone Region Moderate depletion  Dorsal Hand Moderate depletion  Patellar Region Moderate depletion  Anterior Thigh Region Moderate depletion  Posterior Calf Region Moderate depletion  Edema (RD  Assessment) None       Diet Order:   Diet Order             Diet regular Room service appropriate? Yes; Fluid consistency: Thin  Diet effective now                   EDUCATION NEEDS:   Not appropriate for education at this time  Skin:  Skin Assessment: Skin Integrity Issues: Skin Integrity Issues:: Incisions Incisions: chest  Last BM:  PTA, abdomen soft, BS present  Height:   Ht Readings from Last 1 Encounters:  04/24/23 5\' 10"  (1.778 m)    Weight:   Wt Readings from Last 1 Encounters:  04/28/23 81.3 kg    BMI:  Body mass index is 25.73 kg/m.  Estimated Nutritional Needs:   Kcal:  1900-2100 kcals  Protein:  105-115 g  Fluid:  >/= 2L   Romelle Starcher MS, RDN, LDN, CNSC Registered Dietitian 3 Clinical Nutrition RD Inpatient Contact Info in Amion

## 2023-04-28 NOTE — Procedures (Signed)
 Thoracentesis  Procedure Note  Mitchell Frye  [id number]  1950/03/13  Date:04/28/23  Time:9:56 AM   Provider Performing:Abra Lingenfelter   Procedure: Thoracentesis with imaging guidance (95621)  Indication(s) Pleural Effusion  Consent Risks of the procedure as well as the alternatives and risks of each were explained to the patient and/or caregiver.  Consent for the procedure was obtained and is signed in the bedside chart  Anesthesia Topical only with 1% lidocaine    Time Out Verified patient identification, verified procedure, site/side was marked, verified correct patient position, special equipment/implants available, medications/allergies/relevant history reviewed, required imaging and test results available.   Sterile Technique Maximal sterile technique including full sterile barrier drape, hand hygiene, sterile gown, sterile gloves, mask, hair covering, sterile ultrasound probe cover (if used).  Procedure Description Ultrasound was used to identify appropriate pleural anatomy for placement and overlying skin marked.  Area of drainage cleaned and draped in sterile fashion. Lidocaine was used to anesthetize the skin and subcutaneous tissue.  300 cc's of bloody appearing fluid was drained from the right pleural space. Catheter then removed and bandaid applied to site.   Complications/Tolerance None; patient tolerated the procedure well. Chest X-ray is ordered to confirm no post-procedural complication.   EBL Minimal   Specimen(s)

## 2023-04-28 NOTE — Progress Notes (Signed)
 PT Cancellation Note  Patient Details Name: Mitchell Frye MRN: [id number] DOB: 03-Sep-1950   Cancelled Treatment:    Reason Eval/Treat Not Completed: Medical issues which prohibited therapy (Pt is s/p thoracentesis with MD order for x-ray prior to mobility. Acute PT to follow and re-attempt as schedule allows.)  Hilton Cork, PT, DPT Secure Chat Preferred  Rehab Office [phone]  Arturo Morton Brion Aliment 04/28/2023, 11:20 AM

## 2023-04-28 NOTE — Progress Notes (Signed)
      [address]       [place] [address]             [phone]      POD # 3  Resting comfortably  BP 107/81   Pulse 90   Temp 98.2 F (36.8 C) (Axillary)   Resp 20   Ht 5\' 10"  (1.778 m)   Wt 81.3 kg   SpO2 100%   BMI 25.73 kg/m   Intake/Output Summary (Last 24 hours) at 04/28/2023 1735 Last data filed at 04/28/2023 1200 Gross per 24 hour  Intake 960 ml  Output 1155 ml  Net -195 ml   Right thoracentesis- 300 ml bloody fluid- no significant residual on CXR  Awaiting progressive bed  Viviann Spare C. Dorris Fetch, MD [facility] [phone]

## 2023-04-28 NOTE — Progress Notes (Signed)
 3 Days Post-Op Procedure(s) (LRB): VIDEO ASSISTED THORACOSCOPY (VATS)/DECORTICATION (Left) DRAINAGE, PLEURAL EFFUSION (Bilateral) DRAINAGE, PERICARDIAL EFFUSION (N/A) RIGHT PLEURAL CHEST TUBE INSERTION (Right) CREATION, PERICARDIAL WINDOW (N/A) Subjective: No complaints this AM, breathing better  Objective: Vital signs in last 24 hours: Temp:  [97.7 F (36.5 C)-98.2 F (36.8 C)] 97.8 F (36.6 C) (03/14 0330) Pulse Rate:  [85-106] 95 (03/14 0700) Cardiac Rhythm: Normal sinus rhythm (03/13 2000) Resp:  [7-31] 21 (03/14 0700) BP: (88-140)/(54-93) 116/78 (03/14 0700) SpO2:  [94 %-100 %] 100 % (03/14 0700) Arterial Line BP: (123-144)/(56-65) 123/56 (03/13 1000) Weight:  [81.3 kg] 81.3 kg (03/14 0600)  Hemodynamic parameters for last 24 hours:    Intake/Output from previous day: 03/13 0701 - 03/14 0700 In: 573.8 [P.O.:480; I.V.:93.8] Out: 1120 [Urine:750; Chest Tube:370] Intake/Output this shift: No intake/output data recorded.  General appearance: alert, cooperative, and no distress Neurologic: intact Heart: regular rate and rhythm Lungs: diminished breath sounds bibasilar  Lab Results: Recent Labs    04/26/23 0425 04/27/23 0325  WBC 11.2* 9.1  HGB 9.6* 8.5*  HCT 31.8* 28.9*  PLT 352 305   BMET:  Recent Labs    04/26/23 0425 04/27/23 0325  NA 139 136  K 4.2 4.0  CL 110 106  CO2 24 21*  GLUCOSE 102* 113*  BUN 14 16  CREATININE 1.41* 1.27*  CALCIUM 8.4* 8.5*    PT/INR:  Recent Labs    04/25/23 1035  LABPROT 16.8*  INR 1.3*   ABG No results found for: "PHART", "HCO3", "TCO2", "ACIDBASEDEF", "O2SAT" CBG (last 3)  Recent Labs    04/25/23 2200  GLUCAP 114*    Assessment/Plan: S/P Procedure(s) (LRB): VIDEO ASSISTED THORACOSCOPY (VATS)/DECORTICATION (Left) DRAINAGE, PLEURAL EFFUSION (Bilateral) DRAINAGE, PERICARDIAL EFFUSION (N/A) RIGHT PLEURAL CHEST TUBE INSERTION (Right) CREATION, PERICARDIAL WINDOW (N/A) POD # 3  Minimal drainage from  pericardial drain- dc Keep left Cts on water seal Continue IS and flutter PT/OT for deconditioning Tolerating diet Will see if we can get a right thoracentesis today   LOS: 5 days    Loreli Slot 04/28/2023

## 2023-04-29 ENCOUNTER — Inpatient Hospital Stay (HOSPITAL_COMMUNITY)

## 2023-04-29 DIAGNOSIS — E876 Hypokalemia: Secondary | ICD-10-CM | POA: Diagnosis not present

## 2023-04-29 DIAGNOSIS — C3432 Malignant neoplasm of lower lobe, left bronchus or lung: Secondary | ICD-10-CM | POA: Diagnosis not present

## 2023-04-29 DIAGNOSIS — J90 Pleural effusion, not elsewhere classified: Secondary | ICD-10-CM | POA: Diagnosis not present

## 2023-04-29 DIAGNOSIS — R0789 Other chest pain: Secondary | ICD-10-CM | POA: Diagnosis not present

## 2023-04-29 LAB — MAGNESIUM: Magnesium: 2 mg/dL (ref 1.7–2.4)

## 2023-04-29 LAB — BASIC METABOLIC PANEL
Anion gap: 8 (ref 5–15)
BUN: 12 mg/dL (ref 8–23)
CO2: 27 mmol/L (ref 22–32)
Calcium: 8.2 mg/dL — ABNORMAL LOW (ref 8.9–10.3)
Chloride: 105 mmol/L (ref 98–111)
Creatinine, Ser: 1.07 mg/dL (ref 0.61–1.24)
GFR, Estimated: >60 mL/min (ref >60)
Glucose, Bld: 96 mg/dL (ref 70–99)
Potassium: 3.4 mmol/L — ABNORMAL LOW (ref 3.5–5.1)
Sodium: 140 mmol/L (ref 135–145)

## 2023-04-29 LAB — BPAM RBC
Blood Product Expiration Date: 202503302359
Blood Product Expiration Date: 202504072359
Unit Type and Rh: 6200
Unit Type and Rh: 6200

## 2023-04-29 LAB — TYPE AND SCREEN
ABO/RH(D): A POS
Antibody Screen: NEGATIVE
Unit division: 0
Unit division: 0

## 2023-04-29 LAB — CBC
HCT: 30.7 % — ABNORMAL LOW (ref 39.0–52.0)
Hemoglobin: 9.1 g/dL — ABNORMAL LOW (ref 13.0–17.0)
MCH: 22.4 pg — ABNORMAL LOW (ref 26.0–34.0)
MCHC: 29.6 g/dL — ABNORMAL LOW (ref 30.0–36.0)
MCV: 75.6 fL — ABNORMAL LOW (ref 80.0–100.0)
Platelets: 335 10*3/uL (ref 150–400)
RBC: 4.06 MIL/uL — ABNORMAL LOW (ref 4.22–5.81)
RDW: 19.4 % — ABNORMAL HIGH (ref 11.5–15.5)
WBC: 8.6 10*3/uL (ref 4.0–10.5)
nRBC: 0 % (ref 0.0–0.2)

## 2023-04-29 MED ORDER — POTASSIUM CHLORIDE CRYS ER 20 MEQ PO TBCR
60.0000 meq | EXTENDED_RELEASE_TABLET | Freq: Once | ORAL | Status: AC
  Administered 2023-04-29: 60 meq via ORAL
  Filled 2023-04-29: qty 3

## 2023-04-29 NOTE — Progress Notes (Addendum)
      [address]       [place] [address]             [phone]       4 Days Post-Op Procedure(s) (LRB): VIDEO ASSISTED THORACOSCOPY (VATS)/DECORTICATION (Left) DRAINAGE, PLEURAL EFFUSION (Bilateral) DRAINAGE, PERICARDIAL EFFUSION (N/A) RIGHT PLEURAL CHEST TUBE INSERTION (Right) CREATION, PERICARDIAL WINDOW (N/A)  Subjective: Patient sitting in chair. He asked how much fluid was removed from his right lung yesterday. He has no specific complaint today.  Objective: Vital signs in last 24 hours: Temp:  [97.6 F (36.4 C)-98.4 F (36.9 C)] 98.4 F (36.9 C) (03/15 0819) Pulse Rate:  [87-107] 100 (03/15 0255) Cardiac Rhythm: Normal sinus rhythm (03/15 0735) Resp:  [18-27] 20 (03/15 0255) BP: (85-132)/(47-109) 91/68 (03/15 0819) SpO2:  [91 %-100 %] 92 % (03/15 0255) Weight:  [81.4 kg] 81.4 kg (03/14 2008)     Intake/Output from previous day: 03/14 0701 - 03/15 0700 In: 600 [P.O.:600] Out: 845 [Urine:425; Chest Tube:120]   Physical Exam:  Cardiovascular: RRR, no murmurs, gallops, or rubs. Pulmonary: Clear to auscultation bilaterally; no rales, wheezes, or rhonchi. Abdomen: Soft, non tender, bowel sounds present. Extremities: Mild bilateral lower extremity edema. Wounds: Clean and dry.  No erythema or signs of infection. Chest Tube:Tidling with cough but no air leak  Lab Results: CBC: Recent Labs    04/28/23 1012 04/29/23 0216  WBC 7.6 8.6  HGB 8.7* 9.1*  HCT 28.9* 30.7*  PLT 360 335   BMET:  Recent Labs    04/28/23 1012 04/29/23 0216  NA 139 140  K 3.7 3.4*  CL 108 105  CO2 24 27  GLUCOSE 91 96  BUN 13 12  CREATININE 1.14 1.07  CALCIUM 8.3* 8.2*    PT/INR: No results for input(s): "LABPROT", "INR" in the last 72 hours. ABG:  INR: Will add last result for INR, ABG once components are confirmed Will add last 4 CBG results once components are confirmed  Assessment/Plan:  1. CV - SR.  2.  Pulmonary - On 3 liters of oxygen via  [place]. S/p right thoracentesis 03/14 and 300 ml blood fluid removed. Left chest tubes with 120 cc last 24 hours. I marked pleura vac this am (420 cc). Chest tubes are to water seal;tidling with cough but no air leak. No CXR ordered for this am. Will order. As discussed with Dr. Dorris Fetch, will remove anterior chest tube only. Encourage incentive spirometer. 3. Potassium 3.4;primary service supplemented 4. Deconditioned-PT/OT  Beyonca Wisz M ZimmermanPA-C 04/29/2023,8:37 AM

## 2023-04-29 NOTE — Progress Notes (Signed)
 PROGRESS NOTE  Mitchell Frye NFA:[id number] DOB: May 09, 1950   [facility]  Patient is from: Home.  DOA: 04/23/2023 LOS: 6  Chief complaints No chief complaint on file.    Brief Narrative / Interim history: 73 year old M with PMH of stage IIIb NSCLC s/p chemoradiation in 2021 and palliative chemotherapy and radiation in 2023, recurrent left pleural effusion, hypertension and hyperlipidemia presented to [facility] ED on 3/9 with chest pain, progressive shortness of breath and chronic cough.  In ED, CXR revealed stable moderate size loculated left pleural effusion.  Cardiothoracic surgery consulted and patient was transferred to [facility].  CT chest without contrast showed increased small to moderate pericardial effusion and grossly stable moderate to large left pleural effusion with possible pleural thickening.   Patient had left VATS with decortication and pericardial window on 3/11.  Postoperatively, delayed emergence from anesthesia and confusion and transferred to ICU.  Patient also had right thoracocentesis with removal of 300 cc bloody appearing fluid by PCCM on 3/14.  Cardiothoracic surgery following.   Subjective: Seen and examined earlier this morning.  No major events overnight of this morning.  No complaints.  He denies pain, shortness of breath, GI or UTI symptoms.  Objective: Vitals:   04/28/23 2300 04/29/23 0255 04/29/23 0819 04/29/23 1145  BP: 103/65 132/89 91/68 122/73  Pulse: 95 100  99  Resp: 18 20  (!) 22  Temp: 97.6 F (36.4 C) 97.9 F (36.6 C) 98.4 F (36.9 C) 97.8 F (36.6 C)  TempSrc: Oral Oral Oral Oral  SpO2: 92% 92%    Weight:      Height:        Examination:  GENERAL: No apparent distress.  Nontoxic. HEENT: MMM.  Vision and hearing grossly intact.  NECK: Supple.  No apparent JVD.  RESP:  No IWOB.  Fair aeration bilaterally.  Chest tube to left chest. CVS:  RRR. Heart sounds normal.  ABD/GI/GU: BS+. Abd soft, NTND.  MSK/EXT:  Moves  extremities. No apparent deformity. No edema.  SKIN: no apparent skin lesion or wound NEURO: Awake, alert and oriented appropriately.  No apparent focal neuro deficit. PSYCH: Calm. Normal affect.   Procedures:  3/11-VATS/decortication for left pleural effusion.  Pericardial window for pericardial effusion 3/14-thoracocentesis for right pleural effusion  Microbiology summarized: 3/11-MRSA PCR screen negative 3/11-left pleural fluid culture NGTD. 3/11-left pleural fluid AFS negative.  Culture pending.  Assessment and plan: Acute hypoxemic respiratory failure due to recurrent malignant pleural effusion  Bilateral pleural effusion  Pericardial effusion with tamponade Stage IIIb NSCLC s/p chemoradiation in 2021 and palliative chemotherapy and radiation in 2023 -S/p VATS/decortication and chest tube placement for left pleural effusion on 3/11 -S/p pericardial window/drain on 3/11 -S/p right thoracocentesis with removal of 300 cc bloody pleural effusion on 3/14 -Left pleural fluid cultures NGTD.  Cytology negative for malignant cells but visceral peel pathology with NSCLC -Cardiothoracic surgery on board.  Chest tube to waterseal -Incentive spirometry, OOB, PT/OT -Wean oxygen.  Ambulatory saturation. -Pain control  Anemia of chronic disease: H&H relatively stable.  Anemia panel consistent with anemia of chronic disease. Recent Labs    12/05/22 0945 04/11/23 1430 04/23/23 1208 04/24/23 0155 04/25/23 0212 04/25/23 1035 04/26/23 0425 04/27/23 0325 04/28/23 1012 04/29/23 0216  HGB 8.8* 9.1* 10.8* 9.4* 9.1* 9.7* 9.6* 8.5* 8.7* 9.1*  -Monitor CBC.  Postoperative encephalopathy/confusion: Due to anesthesia?  Resolved.  Oriented x 4.  -Reorientation and delirium precaution  AKI: Baseline Cr ~1.1.  AKI resolved. Recent Labs  08/14/22 1555 12/05/22 0945 04/23/23 1208 04/24/23 0155 04/25/23 0212 04/25/23 1035 04/26/23 0425 04/27/23 0325 04/28/23 1012 04/29/23 0216  BUN 16  14 16 13 15 16 14 16 13 12   CREATININE 1.20 1.13 1.12 1.12 1.21 1.42* 1.41* 1.27* 1.14 1.07  -Avoid nephrotoxic meds -Continue monitoring  Physical deconditioning -PT/OT-recommended HH PT/OT/rolling walker.  Moderate malnutrition Body mass index is 25.74 kg/m. Nutrition Problem: Moderate Malnutrition Etiology: chronic illness Signs/Symptoms: mild muscle depletion, mild fat depletion, moderate muscle depletion Interventions: Ensure Enlive (each supplement provides 350kcal and 20 grams of protein), MVI, Refer to RD note for recommendations   DVT prophylaxis:  enoxaparin (LOVENOX) injection 40 mg Start: 04/27/23 0900  Code Status: Full code Family Communication: None at bedside Level of care: Progressive Status is: Inpatient Remains inpatient appropriate because: Recurrent malignant pleural effusion, pericardial effusion and physical deconditioning   Final disposition: Home with home health Consultants:  Cardiothoracic surgery Critical care-signed off.  35 minutes with more than 50% spent in reviewing records, counseling patient/family and coordinating care.   Sch Meds:  Scheduled Meds:  acetaminophen  1,000 mg Oral Q6H   Or   acetaminophen (TYLENOL) oral liquid 160 mg/5 mL  1,000 mg Oral Q6H   bisacodyl  10 mg Oral Daily   Chlorhexidine Gluconate Cloth  6 each Topical Daily   enoxaparin (LOVENOX) injection  40 mg Subcutaneous Q24H   feeding supplement  237 mL Oral BID BM   metoCLOPramide (REGLAN) injection  10 mg Intravenous Q6H   pantoprazole  40 mg Oral Daily   senna-docusate  1 tablet Oral QHS   sodium chloride flush  3 mL Intravenous Q12H   Continuous Infusions: PRN Meds:.guaiFENesin, hydrOXYzine, LORazepam, morphine injection, ondansetron **OR** ondansetron (ZOFRAN) IV, mouth rinse, oxyCODONE, phenol, polyethylene glycol  Antimicrobials: Anti-infectives (From admission, onward)    Start     Dose/Rate Route Frequency Ordered Stop   04/26/23 0100  ceFAZolin  (ANCEF) IVPB 2g/100 mL premix        2 g 200 mL/hr over 30 Minutes Intravenous Every 8 hours 04/25/23 2138 04/27/23 1334   04/25/23 1430  vancomycin (VANCOREADY) IVPB 1250 mg/250 mL  Status:  Discontinued        1,250 mg 166.7 mL/hr over 90 Minutes Intravenous To Surgery 04/25/23 1425 04/25/23 2138   04/25/23 1430  ceFAZolin (ANCEF) IVPB 2g/100 mL premix        2 g 200 mL/hr over 30 Minutes Intravenous To Surgery 04/25/23 1425 04/25/23 1655   04/25/23 1430  ceFAZolin (ANCEF) IVPB 2g/100 mL premix  Status:  Discontinued        2 g 200 mL/hr over 30 Minutes Intravenous To Surgery 04/25/23 1425 04/25/23 2138   04/25/23 0933  ceFAZolin (ANCEF) IVPB 2g/100 mL premix  Status:  Discontinued        2 g 200 mL/hr over 30 Minutes Intravenous 30 min pre-op 04/25/23 0934 04/25/23 2138        I have personally reviewed the following labs and images: CBC: Recent Labs  Lab 04/25/23 0212 04/25/23 1035 04/26/23 0425 04/27/23 0325 04/28/23 1012 04/29/23 0216  WBC 6.2 6.7 11.2* 9.1 7.6 8.6  NEUTROABS 4.6  --  9.6*  --   --   --   HGB 9.1* 9.7* 9.6* 8.5* 8.7* 9.1*  HCT 30.9* 33.3* 31.8* 28.9* 28.9* 30.7*  MCV 76.5* 76.7* 75.7* 76.3* 75.5* 75.6*  PLT 329 371 352 305 360 335   BMP &GFR Recent Labs  Lab 04/24/23 0155 04/25/23 0212 04/25/23 1035  04/26/23 0425 04/27/23 0325 04/28/23 1012 04/29/23 0216  NA 144 142 141 139 136 139 140  K 3.7 3.7 3.9 4.2 4.0 3.7 3.4*  CL 111 110 109 110 106 108 105  CO2 24 25 25 24  21* 24 27  GLUCOSE 108* 114* 108* 102* 113* 91 96  BUN 13 15 16 14 16 13 12   CREATININE 1.12 1.21 1.42* 1.41* 1.27* 1.14 1.07  CALCIUM 8.8* 8.7* 8.9 8.4* 8.5* 8.3* 8.2*  MG 2.2 2.0  --  1.7  --  2.1 2.0  PHOS  --  3.0  --  3.2  --  2.5  --    Estimated Creatinine Clearance: 63.5 mL/min (by C-G formula based on SCr of 1.07 mg/dL). Liver & Pancreas: Recent Labs  Lab 04/25/23 0212 04/25/23 1035 04/26/23 0425 04/27/23 0325 04/28/23 1012  AST 18 19 35 25  --   ALT 15 15  19 14   --   ALKPHOS 50 57 47 42  --   BILITOT 0.9 1.0 1.3* 1.6*  --   PROT 6.7 7.2 6.0* 6.3*  --   ALBUMIN 2.2* 2.4* 2.0* 3.1* 2.3*   No results for input(s): "LIPASE", "AMYLASE" in the last 168 hours. No results for input(s): "AMMONIA" in the last 168 hours. Diabetic: No results for input(s): "HGBA1C" in the last 72 hours.  Recent Labs  Lab 04/25/23 2200  GLUCAP 114*   Cardiac Enzymes: No results for input(s): "CKTOTAL", "CKMB", "CKMBINDEX", "TROPONINI" in the last 168 hours. No results for input(s): "PROBNP" in the last 8760 hours. Coagulation Profile: Recent Labs  Lab 04/25/23 1035  INR 1.3*   Thyroid Function Tests: No results for input(s): "TSH", "T4TOTAL", "FREET4", "T3FREE", "THYROIDAB" in the last 72 hours. Lipid Profile: No results for input(s): "CHOL", "HDL", "LDLCALC", "TRIG", "CHOLHDL", "LDLDIRECT" in the last 72 hours. Anemia Panel: Recent Labs    04/28/23 1012  VITAMINB12 859  FOLATE 10.8  FERRITIN 498*  TIBC 118*  IRON 11*  RETICCTPCT 1.1   Urine analysis: No results found for: "COLORURINE", "APPEARANCEUR", "LABSPEC", "PHURINE", "GLUCOSEU", "HGBUR", "BILIRUBINUR", "KETONESUR", "PROTEINUR", "UROBILINOGEN", "NITRITE", "LEUKOCYTESUR" Sepsis Labs: Invalid input(s): "PROCALCITONIN", "LACTICIDVEN"  Microbiology: Recent Results (from the past 240 hours)  Surgical pcr screen     Status: None   Collection Time: 04/25/23 11:04 AM   Specimen: Nasal Mucosa; Nasal Swab  Result Value Ref Range Status   MRSA, PCR NEGATIVE NEGATIVE Final   Staphylococcus aureus NEGATIVE NEGATIVE Final    Comment: (NOTE) The Xpert SA Assay (FDA approved for NASAL specimens in patients 67 years of age and older), is one component of a comprehensive surveillance program. It is not intended to diagnose infection nor to guide or monitor treatment. Performed at [facility], [address], [place], [place] [address]   Fungus Culture With Stain     Status: None (Preliminary  result)   Collection Time: 04/25/23  5:41 PM   Specimen: PATH Soft tissue  Result Value Ref Range Status   Fungus Stain Final report  Final    Comment: (NOTE) Performed At: [facility] [address] [place], [place] [id number] Jolene Schimke MD XB:[id number]    Fungus (Mycology) Culture PENDING  Incomplete   Fungal Source TISSUE  Final    Comment: LEFT PLEURAL PEEL Performed at [facility], [address], [place], [place] [address]   Aerobic/Anaerobic Culture w Gram Stain (surgical/deep wound)     Status: None (Preliminary result)   Collection Time: 04/25/23  5:41 PM  Specimen: PATH Soft tissue  Result Value Ref Range Status   Specimen Description TISSUE LEFT PLEURAL  Final   Special Requests PEEL  Final   Gram Stain NO WBC SEEN NO ORGANISMS SEEN   Final   Culture   Final    NO GROWTH 4 DAYS NO ANAEROBES ISOLATED; CULTURE IN PROGRESS FOR 5 DAYS Performed at [facility], [address], [place], [place] [address]    Report Status PENDING  Incomplete  Acid Fast Smear (AFB)     Status: None   Collection Time: 04/25/23  5:41 PM   Specimen: PATH Soft tissue  Result Value Ref Range Status   AFB Specimen Processing Concentration  Final   Acid Fast Smear Negative  Final    Comment: (NOTE) Performed At: [facility] [address] [place], [place] [id number] Jolene Schimke MD XB:[id number]    Source (AFB) TISSUE  Final    Comment: LEFT PLEURAL PEEL Performed at [facility], [address], [place], [place] [address]   Fungus Culture Result     Status: None   Collection Time: 04/25/23  5:41 PM  Result Value Ref Range Status   Result 1 Comment  Final    Comment: (NOTE) KOH/Calcofluor preparation:  no fungus observed. Performed At: [facility] [address] [place], [place] [id number] Jolene Schimke MD [id number]     Radiology Studies: No results found.    Morrie Daywalt T. Evalyne Cortopassi [facility]  If 7PM-7AM, please  contact night-coverage [email or web address] 04/29/2023, 12:17 PM

## 2023-04-30 ENCOUNTER — Inpatient Hospital Stay (HOSPITAL_COMMUNITY)

## 2023-04-30 DIAGNOSIS — J90 Pleural effusion, not elsewhere classified: Secondary | ICD-10-CM | POA: Diagnosis not present

## 2023-04-30 LAB — CBC
HCT: 30.5 % — ABNORMAL LOW (ref 39.0–52.0)
Hemoglobin: 9 g/dL — ABNORMAL LOW (ref 13.0–17.0)
MCH: 22 pg — ABNORMAL LOW (ref 26.0–34.0)
MCHC: 29.5 g/dL — ABNORMAL LOW (ref 30.0–36.0)
MCV: 74.6 fL — ABNORMAL LOW (ref 80.0–100.0)
Platelets: 362 10*3/uL (ref 150–400)
RBC: 4.09 MIL/uL — ABNORMAL LOW (ref 4.22–5.81)
RDW: 19.5 % — ABNORMAL HIGH (ref 11.5–15.5)
WBC: 8 10*3/uL (ref 4.0–10.5)
nRBC: 0 % (ref 0.0–0.2)

## 2023-04-30 LAB — RENAL FUNCTION PANEL
Albumin: 2.3 g/dL — ABNORMAL LOW (ref 3.5–5.0)
Anion gap: 9 (ref 5–15)
BUN: 10 mg/dL (ref 8–23)
CO2: 27 mmol/L (ref 22–32)
Calcium: 8.5 mg/dL — ABNORMAL LOW (ref 8.9–10.3)
Chloride: 104 mmol/L (ref 98–111)
Creatinine, Ser: 0.99 mg/dL (ref 0.61–1.24)
GFR, Estimated: >60 mL/min (ref >60)
Glucose, Bld: 95 mg/dL (ref 70–99)
Phosphorus: 2.7 mg/dL (ref 2.5–4.6)
Potassium: 3.6 mmol/L (ref 3.5–5.1)
Sodium: 140 mmol/L (ref 135–145)

## 2023-04-30 LAB — AEROBIC/ANAEROBIC CULTURE W GRAM STAIN (SURGICAL/DEEP WOUND)
Culture: NO GROWTH
Gram Stain: NONE SEEN

## 2023-04-30 LAB — MAGNESIUM: Magnesium: 2 mg/dL (ref 1.7–2.4)

## 2023-04-30 MED ORDER — POTASSIUM CHLORIDE CRYS ER 20 MEQ PO TBCR
40.0000 meq | EXTENDED_RELEASE_TABLET | Freq: Once | ORAL | Status: AC
  Administered 2023-04-30: 40 meq via ORAL
  Filled 2023-04-30: qty 2

## 2023-04-30 MED ORDER — BENZONATATE 100 MG PO CAPS
100.0000 mg | ORAL_CAPSULE | Freq: Two times a day (BID) | ORAL | Status: DC
  Administered 2023-04-30 – 2023-05-02 (×5): 100 mg via ORAL
  Filled 2023-04-30 (×5): qty 1

## 2023-04-30 NOTE — Progress Notes (Signed)
 PROGRESS NOTE    Drayke Grabel  ZOX:[id number] DOB: [birth year] DOA: 04/23/2023 PCP: [facility]     Brief Narrative:  73 year old M with PMH of stage IIIb NSCLC s/p chemoradiation in 2021 and palliative chemotherapy and radiation in 2023, recurrent left pleural effusion, hypertension and hyperlipidemia presented to [facility] ED on 3/9 with chest pain, progressive shortness of breath and chronic cough.  In ED, CXR revealed stable moderate size loculated left pleural effusion.  Cardiothoracic surgery consulted and patient was transferred to [facility].  CT chest without contrast showed increased small to moderate pericardial effusion and grossly stable moderate to large left pleural effusion with possible pleural thickening.    Patient had left VATS with decortication and pericardial window on 3/11.  Postoperatively, delayed emergence from anesthesia and confusion and transferred to ICU.  Patient also had right thoracocentesis with removal of 300 cc bloody appearing fluid by PCCM on 3/14.   Cardiothoracic surgery following.   New events last 24 hours / Subjective: Reports feeling well. On RA. No acute events over night. Wondering when he can have chest tube out.   Assessment & Plan:   Acute hypoxemic respiratory failure due to recurrent malignant pleural effusion  Bilateral pleural effusion  Pericardial effusion with tamponade Stage IIIb NSCLC s/p chemoradiation in 2021 and palliative chemotherapy and radiation in 2023 -S/p VATS/decortication and chest tube placement for left pleural effusion on 3/11 -S/p pericardial window/drain on 3/11 -S/p right thoracocentesis with removal of 300 cc bloody pleural effusion on 3/14 -Left pleural fluid cultures NGTD.  Cytology negative for malignant cells but visceral peel pathology with NSCLC -Cardiothoracic surgery on board.  Chest tube to waterseal, will remove at their recommendation -Incentive spirometry, OOB, PT/OT -Wean oxygen.  Currently  RA.  -Pain control -Tessalon Perles   Anemia of chronic disease:  -Monitor CBC.  Postoperative encephalopathy/confusion: -Reorientation and delirium precaution   AKI:  -Baseline Cr ~1.1.  AKI resolved.  Nutrition Problem: Moderate Malnutrition Etiology: chronic illness   DVT prophylaxis: Lovenox Code Status: Full Family Communication: None at bedside Coming From: Home  Disposition Plan: hopefully home Barriers to Discharge: Clinical improvement  Consultants:  CTS CCM  Procedures:  3/11-VATS/decortication for left pleural effusion.  Pericardial window for pericardial effusion 3/14-thoracocentesis for right pleural effusion  Antimicrobials:  Anti-infectives (From admission, onward)    Start     Dose/Rate Route Frequency Ordered Stop   04/26/23 0100  ceFAZolin (ANCEF) IVPB 2g/100 mL premix        2 g 200 mL/hr over 30 Minutes Intravenous Every 8 hours 04/25/23 2138 04/27/23 1334   04/25/23 1430  vancomycin (VANCOREADY) IVPB 1250 mg/250 mL  Status:  Discontinued        1,250 mg 166.7 mL/hr over 90 Minutes Intravenous To Surgery 04/25/23 1425 04/25/23 2138   04/25/23 1430  ceFAZolin (ANCEF) IVPB 2g/100 mL premix        2 g 200 mL/hr over 30 Minutes Intravenous To Surgery 04/25/23 1425 04/25/23 1655   04/25/23 1430  ceFAZolin (ANCEF) IVPB 2g/100 mL premix  Status:  Discontinued        2 g 200 mL/hr over 30 Minutes Intravenous To Surgery 04/25/23 1425 04/25/23 2138   04/25/23 0933  ceFAZolin (ANCEF) IVPB 2g/100 mL premix  Status:  Discontinued        2 g 200 mL/hr over 30 Minutes Intravenous 30 min pre-op 04/25/23 0934 04/25/23 2138        Objective: Vitals:   04/30/23 0328 04/30/23 0635 04/30/23  0818 04/30/23 1121  BP: 114/80  108/72 120/76  Pulse: (!) 102 97  93  Resp: 19 (!) 32 20 20  Temp: 98.8 F (37.1 C)  98 F (36.7 C) 97.6 F (36.4 C)  TempSrc: Oral  Oral Oral  SpO2: 93% 98% 100% 99%  Weight:  80.8 kg    Height:        Intake/Output Summary  (Last 24 hours) at 04/30/2023 1410 Last data filed at 04/30/2023 0820 Gross per 24 hour  Intake 600 ml  Output 200 ml  Net 400 ml   Filed Weights   04/28/23 0600 04/28/23 2008 04/30/23 0635  Weight: 81.3 kg 81.4 kg 80.8 kg    Examination:  General exam: Appears calm and comfortable  Respiratory system: Clear to auscultation. Respiratory effort normal. No respiratory distress. No conversational dyspnea.  Cardiovascular system: S1 & S2 heard, RRR. No murmurs. No pedal edema. Central nervous system: Alert and oriented. No focal neurological deficits. Speech clear.  Extremities: Symmetric in appearance  Skin: No rashes, lesions or ulcers on exposed skin  Psychiatry: Judgement and insight appear normal. Mood & affect appropriate.   Data Reviewed: I have personally reviewed following labs and imaging studies  CBC: Recent Labs  Lab 04/25/23 0212 04/25/23 1035 04/26/23 0425 04/27/23 0325 04/28/23 1012 04/29/23 0216 04/30/23 0209  WBC 6.2   < > 11.2* 9.1 7.6 8.6 8.0  NEUTROABS 4.6  --  9.6*  --   --   --   --   HGB 9.1*   < > 9.6* 8.5* 8.7* 9.1* 9.0*  HCT 30.9*   < > 31.8* 28.9* 28.9* 30.7* 30.5*  MCV 76.5*   < > 75.7* 76.3* 75.5* 75.6* 74.6*  PLT 329   < > 352 305 360 335 362   < > = values in this interval not displayed.   Basic Metabolic Panel: Recent Labs  Lab 04/25/23 0212 04/25/23 1035 04/26/23 0425 04/27/23 0325 04/28/23 1012 04/29/23 0216 04/30/23 0209  NA 142   < > 139 136 139 140 140  K 3.7   < > 4.2 4.0 3.7 3.4* 3.6  CL 110   < > 110 106 108 105 104  CO2 25   < > 24 21* 24 27 27   GLUCOSE 114*   < > 102* 113* 91 96 95  BUN 15   < > 14 16 13 12 10   CREATININE 1.21   < > 1.41* 1.27* 1.14 1.07 0.99  CALCIUM 8.7*   < > 8.4* 8.5* 8.3* 8.2* 8.5*  MG 2.0  --  1.7  --  2.1 2.0 2.0  PHOS 3.0  --  3.2  --  2.5  --  2.7   < > = values in this interval not displayed.   GFR: Estimated Creatinine Clearance: 68.6 mL/min (by C-G formula based on SCr of 0.99  mg/dL).  Liver Function Tests: Recent Labs  Lab 04/25/23 0212 04/25/23 1035 04/26/23 0425 04/27/23 0325 04/28/23 1012 04/30/23 0209  AST 18 19 35 25  --   --   ALT 15 15 19 14   --   --   ALKPHOS 50 57 47 42  --   --   BILITOT 0.9 1.0 1.3* 1.6*  --   --   PROT 6.7 7.2 6.0* 6.3*  --   --   ALBUMIN 2.2* 2.4* 2.0* 3.1* 2.3* 2.3*   Coagulation Profile: Recent Labs  Lab 04/25/23 1035  INR 1.3*   CBG: Recent Labs  Lab 04/25/23 2200  GLUCAP 114*   Anemia Panel: Recent Labs    04/28/23 1012  VITAMINB12 859  FOLATE 10.8  FERRITIN 498*  TIBC 118*  IRON 11*  RETICCTPCT 1.1    Recent Results (from the past 240 hours)  Surgical pcr screen     Status: None   Collection Time: 04/25/23 11:04 AM   Specimen: Nasal Mucosa; Nasal Swab  Result Value Ref Range Status   MRSA, PCR NEGATIVE NEGATIVE Final   Staphylococcus aureus NEGATIVE NEGATIVE Final    Comment: (NOTE) The Xpert SA Assay (FDA approved for NASAL specimens in patients 42 years of age and older), is one component of a comprehensive surveillance program. It is not intended to diagnose infection nor to guide or monitor treatment. Performed at [facility], [address], [place], [place] [address]   Fungus Culture With Stain     Status: None (Preliminary result)   Collection Time: 04/25/23  5:41 PM   Specimen: PATH Soft tissue  Result Value Ref Range Status   Fungus Stain Final report  Final    Comment: (NOTE) Performed At: [facility] [address] [place], [place] [id number] Jolene Schimke MD XB:[id number]    Fungus (Mycology) Culture PENDING  Incomplete   Fungal Source TISSUE  Final    Comment: LEFT PLEURAL PEEL Performed at [facility], [address], [place], [place] [address]   Aerobic/Anaerobic Culture w Gram Stain (surgical/deep wound)     Status: None   Collection Time: 04/25/23  5:41 PM   Specimen: PATH Soft tissue  Result Value Ref Range Status   Specimen  Description TISSUE LEFT PLEURAL  Final   Special Requests PEEL  Final   Gram Stain NO WBC SEEN NO ORGANISMS SEEN   Final   Culture   Final    No growth aerobically or anaerobically. Performed at [facility], [address], [place], [place] [address]    Report Status 04/30/2023 FINAL  Final  Acid Fast Smear (AFB)     Status: None   Collection Time: 04/25/23  5:41 PM   Specimen: PATH Soft tissue  Result Value Ref Range Status   AFB Specimen Processing Concentration  Final   Acid Fast Smear Negative  Final    Comment: (NOTE) Performed At: [facility] [address] [place], [place] [id number] Jolene Schimke MD KG:[id number]    Source (AFB) TISSUE  Final    Comment: LEFT PLEURAL PEEL Performed at [facility], [address], [place], [place] [address]   Fungus Culture Result     Status: None   Collection Time: 04/25/23  5:41 PM  Result Value Ref Range Status   Result 1 Comment  Final    Comment: (NOTE) KOH/Calcofluor preparation:  no fungus observed. Performed At: [facility] [address] [place], [place] [id number] Jolene Schimke MD OV:[id number]       Radiology Studies: DG CHEST PORT 1 VIEW Result Date: 04/30/2023 CLINICAL DATA:  Pneumothorax EXAM: PORTABLE CHEST 1 VIEW COMPARISON:  Yesterday FINDINGS: Single remaining left chest tube with lateral pneumothorax that is small. Small right pleural effusion tracking along the lateral chest wall. Airspace disease in the left mid to lower lung. Stable heart size and mediastinal contours. There is a pericardial effusion by most recent CT. Right port with tip at the upper right atrium. IMPRESSION: Small lateral left pneumothorax. Remaining chest tube in stable position and more medial than the visible gas. Stable aeration.  Electronically Signed   By: Tiburcio Pea M.D.   On: 04/30/2023 09:40   DG CHEST PORT 1 VIEW Result Date: 04/29/2023 CLINICAL DATA:  Pleural effusion on the left. History of  recurrent effusion. EXAM: PORTABLE CHEST 1 VIEW COMPARISON:  04/28/2023. FINDINGS: Power port is unchanged with its tip in the proximal right atrium. Two left chest tubes remain in place. No pleural air scratch that there may be a small amount of pleural air at the inferior pleural space. No evidence of reaccumulation of pleural fluid. Persistent volume loss/infiltrate in the left lower lobe. Mild volume loss in the right lower lobe. IMPRESSION: Two left chest tubes remain in place. No evidence of reaccumulation of pleural fluid. Probable small amount of persistent pleural air at the left base laterally. Persistent volume loss/infiltrate in the left lower lobe. Mild volume loss in the right lower lobe. Electronically Signed   By: Paulina Fusi M.D.   On: 04/29/2023 13:41     Scheduled Meds:  acetaminophen  1,000 mg Oral Q6H   Or   acetaminophen (TYLENOL) oral liquid 160 mg/5 mL  1,000 mg Oral Q6H   benzonatate  100 mg Oral BID   bisacodyl  10 mg Oral Daily   Chlorhexidine Gluconate Cloth  6 each Topical Daily   enoxaparin (LOVENOX) injection  40 mg Subcutaneous Q24H   feeding supplement  237 mL Oral BID BM   metoCLOPramide (REGLAN) injection  10 mg Intravenous Q6H   pantoprazole  40 mg Oral Daily   senna-docusate  1 tablet Oral QHS   sodium chloride flush  3 mL Intravenous Q12H   Continuous Infusions:   LOS: 7 days    Time spent: 35 minutes   Sharlene Dory, DO [facility] 04/30/2023, 2:10 PM   Available via Epic secure chat 7am-7pm After these hours, please refer to coverage provider listed on [email or web address]

## 2023-04-30 NOTE — Progress Notes (Addendum)
      [address]       [place] [address]             [phone]       5 Days Post-Op Procedure(s) (LRB): VIDEO ASSISTED THORACOSCOPY (VATS)/DECORTICATION (Left) DRAINAGE, PLEURAL EFFUSION (Bilateral) DRAINAGE, PERICARDIAL EFFUSION (N/A) RIGHT PLEURAL CHEST TUBE INSERTION (Right) CREATION, PERICARDIAL WINDOW (N/A)  Subjective: Patient sitting in chair this am. He said he hopes "the other chest tube can come out today". He had a "coughing spell" last night and Robitussin did not help much  Objective: Vital signs in last 24 hours: Temp:  [97.8 F (36.6 C)-98.8 F (37.1 C)] 98 F (36.7 C) (03/16 0818) Pulse Rate:  [97-109] 97 (03/16 0635) Cardiac Rhythm: Normal sinus rhythm (03/16 0729) Resp:  [19-32] 20 (03/16 0818) BP: (108-128)/(72-80) 108/72 (03/16 0818) SpO2:  [93 %-100 %] 100 % (03/16 0818) Weight:  [80.8 kg] 80.8 kg (03/16 0635)     Intake/Output from previous day: 03/15 0701 - 03/16 0700 In: 850 [P.O.:840; I.V.:10] Out: 240 [Chest Tube:240]   Physical Exam:  Cardiovascular: RRR. Pulmonary: Clear to auscultation on right and coarse (chest tube with rub) on the left Abdomen: Soft, non tender, bowel sounds present. Extremities: Trace bilateral lower extremity edema. Wounds: Clean and dry.  No erythema or signs of infection. Chest Tube:Tidling with cough and one bubble after 3 coughs  Lab Results: CBC: Recent Labs    04/29/23 0216 04/30/23 0209  WBC 8.6 8.0  HGB 9.1* 9.0*  HCT 30.7* 30.5*  PLT 335 362   BMET:  Recent Labs    04/29/23 0216 04/30/23 0209  NA 140 140  K 3.4* 3.6  CL 105 104  CO2 27 27  GLUCOSE 96 95  BUN 12 10  CREATININE 1.07 0.99  CALCIUM 8.2* 8.5*    PT/INR: No results for input(s): "LABPROT", "INR" in the last 72 hours. ABG:  INR: Will add last result for INR, ABG once components are confirmed Will add last 4 CBG results once components are confirmed  Assessment/Plan:  1. CV - SR.  2.  Pulmonary  - On room air. S/p right thoracentesis 03/14 and 300 ml blood fluid removed. Left chest tubes with 240 cc last 24 hours.Anterior chest tube removed 03/15. Chest tube is to water seal;tidling with cough and one bubble after 3 coughs. CXR this am appears to show ? trace left apical pneumothorax, atelectasis. Hope to remove chest tube soon. Will try Tesslon Perles for cough. Encourage incentive spirometer. 3. Supplement potassium as 3.6. 4. Deconditioned-PT/OT  Mychaela Lennartz M ZimmermanPA-C 04/30/2023,8:33 AM

## 2023-04-30 NOTE — Plan of Care (Signed)
  Problem: Education: Goal: Knowledge of General Education information will improve Description: Including pain rating scale, medication(s)/side effects and non-pharmacologic comfort measures Outcome: Progressing   Problem: Health Behavior/Discharge Planning: Goal: Ability to manage health-related needs will improve Outcome: Progressing   Problem: Clinical Measurements: Goal: Diagnostic test results will improve Outcome: Progressing Goal: Respiratory complications will improve Outcome: Progressing Goal: Cardiovascular complication will be avoided Outcome: Progressing   Problem: Activity: Goal: Risk for activity intolerance will decrease Outcome: Progressing   Problem: Nutrition: Goal: Adequate nutrition will be maintained Outcome: Progressing   Problem: Coping: Goal: Level of anxiety will decrease Outcome: Progressing   Problem: Pain Managment: Goal: General experience of comfort will improve and/or be controlled Outcome: Progressing   Problem: Safety: Goal: Ability to remain free from injury will improve Outcome: Progressing   Problem: Skin Integrity: Goal: Risk for impaired skin integrity will decrease Outcome: Progressing   Problem: Activity: Goal: Risk for activity intolerance will decrease Outcome: Progressing   Problem: Cardiac: Goal: Will achieve and/or maintain hemodynamic stability Outcome: Progressing   Problem: Clinical Measurements: Goal: Postoperative complications will be avoided or minimized Outcome: Progressing   Problem: Respiratory: Goal: Respiratory status will improve Outcome: Progressing   Problem: Skin Integrity: Goal: Wound healing without signs and symptoms infection will improve Outcome: Progressing

## 2023-04-30 NOTE — Discharge Instructions (Signed)
ACTIVITY:  1.Increase activity slowly. 2.Walk daily and increase frequency and duration as tolerates. 3.May walk up steps. 4.No lifting more than ten pounds for two weeks. 5.No driving for two weeks. 6.Avoid straining. 7.STOP any activity that causes chest pain, shortness of breath, dizziness,sweating,     or excessive weakness. 8.Continue with breathing exercises daily.   WOUND:  1.May shower. 2.Clean wounds with mild soap and water.  Call the office at [phone] if any problems arise.       

## 2023-05-01 ENCOUNTER — Inpatient Hospital Stay (HOSPITAL_COMMUNITY)

## 2023-05-01 DIAGNOSIS — J90 Pleural effusion, not elsewhere classified: Secondary | ICD-10-CM | POA: Diagnosis not present

## 2023-05-01 LAB — CBC
HCT: 28.2 % — ABNORMAL LOW (ref 39.0–52.0)
Hemoglobin: 8.5 g/dL — ABNORMAL LOW (ref 13.0–17.0)
MCH: 22.3 pg — ABNORMAL LOW (ref 26.0–34.0)
MCHC: 30.1 g/dL (ref 30.0–36.0)
MCV: 73.8 fL — ABNORMAL LOW (ref 80.0–100.0)
Platelets: 335 10*3/uL (ref 150–400)
RBC: 3.82 MIL/uL — ABNORMAL LOW (ref 4.22–5.81)
RDW: 19.1 % — ABNORMAL HIGH (ref 11.5–15.5)
WBC: 7.1 10*3/uL (ref 4.0–10.5)
nRBC: 0 % (ref 0.0–0.2)

## 2023-05-01 LAB — BASIC METABOLIC PANEL
Anion gap: 7 (ref 5–15)
BUN: 8 mg/dL (ref 8–23)
CO2: 25 mmol/L (ref 22–32)
Calcium: 8.3 mg/dL — ABNORMAL LOW (ref 8.9–10.3)
Chloride: 107 mmol/L (ref 98–111)
Creatinine, Ser: 1.04 mg/dL (ref 0.61–1.24)
GFR, Estimated: >60 mL/min (ref >60)
Glucose, Bld: 103 mg/dL — ABNORMAL HIGH (ref 70–99)
Potassium: 4 mmol/L (ref 3.5–5.1)
Sodium: 139 mmol/L (ref 135–145)

## 2023-05-01 MED ORDER — MELATONIN 5 MG PO TABS
5.0000 mg | ORAL_TABLET | Freq: Every day | ORAL | Status: DC
  Administered 2023-05-01: 5 mg via ORAL
  Filled 2023-05-01: qty 1

## 2023-05-01 NOTE — Progress Notes (Addendum)
 PROGRESS NOTE    Tor Tsuda  FUX:[id number] DOB: 29-Mar-1950 DOA: 04/23/2023 PCP: [facility]     Brief Narrative:  73 year old M with PMH of stage IIIb NSCLC s/p chemoradiation in 2021 and palliative chemotherapy and radiation in 2023, recurrent left pleural effusion, hypertension and hyperlipidemia presented to [facility] ED on 3/9 with chest pain, progressive shortness of breath and chronic cough.  In ED, CXR revealed stable moderate size loculated left pleural effusion.  Cardiothoracic surgery consulted and patient was transferred to [facility].  CT chest without contrast showed increased small to moderate pericardial effusion and grossly stable moderate to large left pleural effusion with possible pleural thickening.    Patient had left VATS with decortication and pericardial window on 3/11.  Postoperatively, delayed emergence from anesthesia and confusion and transferred to ICU.  Patient also had right thoracocentesis with removal of 300 cc bloody appearing fluid by PCCM on 3/14.   Cardiothoracic surgery following.   New events last 24 hours / Subjective: Patient complains of difficulty sleeping.  Assessment & Plan:   Acute hypoxemic respiratory failure due to recurrent malignant pleural effusion  Bilateral pleural effusion  Pericardial effusion with tamponade Stage IIIb NSCLC s/p chemoradiation in 2021 and palliative chemotherapy and radiation in 2023 -S/p VATS/decortication and chest tube placement for left pleural effusion on 3/11 -S/p pericardial window/drain on 3/11 -S/p right thoracocentesis with removal of 300 cc bloody pleural effusion on 3/14 -Left pleural fluid cultures NGTD.  Cytology negative for malignant cells but visceral peel pathology with NSCLC -Cardiothoracic surgery on board.  Chest tube to waterseal, will remove at their recommendation -Incentive spirometry, OOB, PT/OT -Weaned oxygen.  Currently RA.  -Pain control -Tessalon Perles   Anemia of  chronic disease:  -Monitor CBC.  Postoperative encephalopathy/confusion: -Reorientation and delirium precaution   AKI:  -Baseline Cr ~1.1.  AKI resolved.  Nutrition Problem: Moderate Malnutrition Etiology: chronic illness   DVT prophylaxis: Lovenox Code Status: Full Family Communication: None at bedside Coming From: Home  Disposition Plan: hopefully home with HHPT Barriers to Discharge: Clinical improvement, removal of chest tube  Consultants:  CTS CCM  Procedures:  3/11-VATS/decortication for left pleural effusion.  Pericardial window for pericardial effusion 3/14-thoracocentesis for right pleural effusion  Antimicrobials:  Anti-infectives (From admission, onward)    Start     Dose/Rate Route Frequency Ordered Stop   04/26/23 0100  ceFAZolin (ANCEF) IVPB 2g/100 mL premix        2 g 200 mL/hr over 30 Minutes Intravenous Every 8 hours 04/25/23 2138 04/27/23 1334   04/25/23 1430  vancomycin (VANCOREADY) IVPB 1250 mg/250 mL  Status:  Discontinued        1,250 mg 166.7 mL/hr over 90 Minutes Intravenous To Surgery 04/25/23 1425 04/25/23 2138   04/25/23 1430  ceFAZolin (ANCEF) IVPB 2g/100 mL premix        2 g 200 mL/hr over 30 Minutes Intravenous To Surgery 04/25/23 1425 04/25/23 1655   04/25/23 1430  ceFAZolin (ANCEF) IVPB 2g/100 mL premix  Status:  Discontinued        2 g 200 mL/hr over 30 Minutes Intravenous To Surgery 04/25/23 1425 04/25/23 2138   04/25/23 0933  ceFAZolin (ANCEF) IVPB 2g/100 mL premix  Status:  Discontinued        2 g 200 mL/hr over 30 Minutes Intravenous 30 min pre-op 04/25/23 0934 04/25/23 2138        Objective: Vitals:   05/01/23 0752 05/01/23 0753 05/01/23 1214 05/01/23 1215  BP: 108/78 108/78 104/85  Pulse:  97 96 97  Resp: (!) 29 (!) 21 (!) 27 (!) 21  Temp: 97.8 F (36.6 C)  98.1 F (36.7 C)   TempSrc: Oral  Oral   SpO2: 99% 96% 97% 97%  Weight:      Height:        Intake/Output Summary (Last 24 hours) at 05/01/2023 1358 Last  data filed at 05/01/2023 0831 Gross per 24 hour  Intake 130 ml  Output 1000 ml  Net -870 ml   Filed Weights   04/28/23 0600 04/28/23 2008 04/30/23 0635  Weight: 81.3 kg 81.4 kg 80.8 kg   Physical Exam  General: Alert, oriented X3  Eyes: Pupils equal, reactive  Oral cavity: moist mucous membranes  Head: Atraumatic, normocephalic  Neck: supple  Chest: Diminished breath sounds left side.  Chest tube in place. CVS: S1,S2 RRR. No murmurs  Abd: No distention, soft, non-tender. No masses palpable  Extr: No edema   MSK: No joint deformities or swelling  Neurological: Grossly intact.    Data Reviewed: I have personally reviewed following labs and imaging studies  CBC: Recent Labs  Lab 04/25/23 0212 04/25/23 1035 04/26/23 0425 04/27/23 0325 04/28/23 1012 04/29/23 0216 04/30/23 0209 05/01/23 0212  WBC 6.2   < > 11.2* 9.1 7.6 8.6 8.0 7.1  NEUTROABS 4.6  --  9.6*  --   --   --   --   --   HGB 9.1*   < > 9.6* 8.5* 8.7* 9.1* 9.0* 8.5*  HCT 30.9*   < > 31.8* 28.9* 28.9* 30.7* 30.5* 28.2*  MCV 76.5*   < > 75.7* 76.3* 75.5* 75.6* 74.6* 73.8*  PLT 329   < > 352 305 360 335 362 335   < > = values in this interval not displayed.   Basic Metabolic Panel: Recent Labs  Lab 04/25/23 0212 04/25/23 1035 04/26/23 0425 04/27/23 0325 04/28/23 1012 04/29/23 0216 04/30/23 0209 05/01/23 0212  NA 142   < > 139 136 139 140 140 139  K 3.7   < > 4.2 4.0 3.7 3.4* 3.6 4.0  CL 110   < > 110 106 108 105 104 107  CO2 25   < > 24 21* 24 27 27 25   GLUCOSE 114*   < > 102* 113* 91 96 95 103*  BUN 15   < > 14 16 13 12 10 8   CREATININE 1.21   < > 1.41* 1.27* 1.14 1.07 0.99 1.04  CALCIUM 8.7*   < > 8.4* 8.5* 8.3* 8.2* 8.5* 8.3*  MG 2.0  --  1.7  --  2.1 2.0 2.0  --   PHOS 3.0  --  3.2  --  2.5  --  2.7  --    < > = values in this interval not displayed.   GFR: Estimated Creatinine Clearance: 65.3 mL/min (by C-G formula based on SCr of 1.04 mg/dL).  Liver Function Tests: Recent Labs  Lab  04/25/23 0212 04/25/23 1035 04/26/23 0425 04/27/23 0325 04/28/23 1012 04/30/23 0209  AST 18 19 35 25  --   --   ALT 15 15 19 14   --   --   ALKPHOS 50 57 47 42  --   --   BILITOT 0.9 1.0 1.3* 1.6*  --   --   PROT 6.7 7.2 6.0* 6.3*  --   --   ALBUMIN 2.2* 2.4* 2.0* 3.1* 2.3* 2.3*   Coagulation Profile: Recent Labs  Lab 04/25/23 1035  INR  1.3*   CBG: Recent Labs  Lab 04/25/23 2200  GLUCAP 114*   Anemia Panel: No results for input(s): "VITAMINB12", "FOLATE", "FERRITIN", "TIBC", "IRON", "RETICCTPCT" in the last 72 hours.   Recent Results (from the past 240 hours)  Surgical pcr screen     Status: None   Collection Time: 04/25/23 11:04 AM   Specimen: Nasal Mucosa; Nasal Swab  Result Value Ref Range Status   MRSA, PCR NEGATIVE NEGATIVE Final   Staphylococcus aureus NEGATIVE NEGATIVE Final    Comment: (NOTE) The Xpert SA Assay (FDA approved for NASAL specimens in patients 83 years of age and older), is one component of a comprehensive surveillance program. It is not intended to diagnose infection nor to guide or monitor treatment. Performed at [facility], [address] [place], [place] [address]   Fungus Culture With Stain     Status: None (Preliminary result)   Collection Time: 04/25/23  5:41 PM   Specimen: PATH Soft tissue  Result Value Ref Range Status   Fungus Stain Final report  Final    Comment: (NOTE) Performed At: [facility] [address] [place], [place] [id number] Jolene Schimke MD AC:[id number]    Fungus (Mycology) Culture PENDING  Incomplete   Fungal Source TISSUE  Final    Comment: LEFT PLEURAL PEEL Performed at [facility], [address], [place], [place] [address]   Aerobic/Anaerobic Culture w Gram Stain (surgical/deep wound)     Status: None   Collection Time: 04/25/23  5:41 PM   Specimen: PATH Soft tissue  Result Value Ref Range Status   Specimen Description TISSUE LEFT PLEURAL  Final   Special Requests PEEL   Final   Gram Stain NO WBC SEEN NO ORGANISMS SEEN   Final   Culture   Final    No growth aerobically or anaerobically. Performed at [facility], [address] [place], [place] [address]    Report Status 04/30/2023 FINAL  Final  Acid Fast Smear (AFB)     Status: None   Collection Time: 04/25/23  5:41 PM   Specimen: PATH Soft tissue  Result Value Ref Range Status   AFB Specimen Processing Concentration  Final   Acid Fast Smear Negative  Final    Comment: (NOTE) Performed At: [facility] [address] [place], [place] [id number] Jolene Schimke MD WC:[id number]    Source (AFB) TISSUE  Final    Comment: LEFT PLEURAL PEEL Performed at [facility], [address], [place], [place] [address]   Fungus Culture Result     Status: None   Collection Time: 04/25/23  5:41 PM  Result Value Ref Range Status   Result 1 Comment  Final    Comment: (NOTE) KOH/Calcofluor preparation:  no fungus observed. Performed At: [facility] [address] [place], [place] [id number] Jolene Schimke MD WN:[id number]       Radiology Studies: DG CHEST PORT 1 VIEW Result Date: 05/01/2023 CLINICAL DATA:  Pneumothorax EXAM: PORTABLE CHEST 1 VIEW COMPARISON:  X-ray 04/30/2023 and older. FINDINGS: Right IJ chest port with tip along the SVC right atrial junction. Left-sided chest tube in place. Stable lateral left pneumothorax. Patchy left mid lower lung parenchymal opacities also similar. Tiny right effusion with some adjacent opacity. No edema. No right-sided effusion. Stable cardiopericardial silhouette. Overlapping cardiac leads. IMPRESSION: No significant oval change when adjusted for technique. Electronically Signed   By: Karen Kays M.D.   On: 05/01/2023 11:56   DG CHEST  PORT 1 VIEW Result Date: 04/30/2023 CLINICAL DATA:  Pneumothorax EXAM: PORTABLE CHEST 1 VIEW COMPARISON:  Yesterday FINDINGS: Single remaining left chest tube with lateral pneumothorax that is small. Small  right pleural effusion tracking along the lateral chest wall. Airspace disease in the left mid to lower lung. Stable heart size and mediastinal contours. There is a pericardial effusion by most recent CT. Right port with tip at the upper right atrium. IMPRESSION: Small lateral left pneumothorax. Remaining chest tube in stable position and more medial than the visible gas. Stable aeration. Electronically Signed   By: Tiburcio Pea M.D.   On: 04/30/2023 09:40     Scheduled Meds:  benzonatate  100 mg Oral BID   bisacodyl  10 mg Oral Daily   Chlorhexidine Gluconate Cloth  6 each Topical Daily   enoxaparin (LOVENOX) injection  40 mg Subcutaneous Q24H   feeding supplement  237 mL Oral BID BM   melatonin  5 mg Oral QHS   pantoprazole  40 mg Oral Daily   senna-docusate  1 tablet Oral QHS   sodium chloride flush  3 mL Intravenous Q12H   Continuous Infusions:   LOS: 8 days    Time spent: 35 minutes   MDALA-GAUSI, Gwenette Greet, MD  [facility] 05/01/2023, 1:58 PM   Available via Epic secure chat 7am-7pm After these hours, please refer to coverage provider listed on [email or web address]

## 2023-05-01 NOTE — Care Management Important Message (Signed)
 Important Message  Patient Details  Name: Mitchell Frye MRN: [id number] Date of Birth: [birth year]   Important Message Given:  Yes - Medicare IM     Dorena Bodo 05/01/2023, 2:09 PM

## 2023-05-01 NOTE — Progress Notes (Addendum)
 6 Days Post-Op Procedure(s) (LRB): VIDEO ASSISTED THORACOSCOPY (VATS)/DECORTICATION (Left) DRAINAGE, PLEURAL EFFUSION (Bilateral) DRAINAGE, PERICARDIAL EFFUSION (N/A) RIGHT PLEURAL CHEST TUBE INSERTION (Right) CREATION, PERICARDIAL WINDOW (N/A) Subjective: No specific c/o  Objective: Vital signs in last 24 hours: Temp:  [97.6 F (36.4 C)-98.7 F (37.1 C)] 98.7 F (37.1 C) (03/17 0310) Pulse Rate:  [90-112] 112 (03/17 0310) Cardiac Rhythm: Normal sinus rhythm (03/16 2100) Resp:  [20-25] 20 (03/17 0310) BP: (108-127)/(69-78) 127/78 (03/17 0310) SpO2:  [95 %-100 %] 95 % (03/17 0310)  Hemodynamic parameters for last 24 hours:    Intake/Output from previous day: 03/16 0701 - 03/17 0700 In: 240 [P.O.:240] Out: 900 [Urine:600; Chest Tube:300] Intake/Output this shift: No intake/output data recorded.  General appearance: alert, cooperative, and no distress Heart: regular rate and rhythm and occas extrasystole Lungs: coarse in left base Abdomen: benign Extremities: no edema Wound: incis healing well  Lab Results: Recent Labs    04/30/23 0209 05/01/23 0212  WBC 8.0 7.1  HGB 9.0* 8.5*  HCT 30.5* 28.2*  PLT 362 335   BMET:  Recent Labs    04/30/23 0209 05/01/23 0212  NA 140 139  K 3.6 4.0  CL 104 107  CO2 27 25  GLUCOSE 95 103*  BUN 10 8  CREATININE 0.99 1.04  CALCIUM 8.5* 8.3*    PT/INR: No results for input(s): "LABPROT", "INR" in the last 72 hours. ABG No results found for: "PHART", "HCO3", "TCO2", "ACIDBASEDEF", "O2SAT" CBG (last 3)  No results for input(s): "GLUCAP" in the last 72 hours.  Meds Scheduled Meds:  benzonatate  100 mg Oral BID   bisacodyl  10 mg Oral Daily   Chlorhexidine Gluconate Cloth  6 each Topical Daily   enoxaparin (LOVENOX) injection  40 mg Subcutaneous Q24H   feeding supplement  237 mL Oral BID BM   pantoprazole  40 mg Oral Daily   senna-docusate  1 tablet Oral QHS   sodium chloride flush  3 mL Intravenous Q12H   Continuous  Infusions: PRN Meds:.guaiFENesin, hydrOXYzine, LORazepam, morphine injection, ondansetron **OR** ondansetron (ZOFRAN) IV, mouth rinse, oxyCODONE, phenol, polyethylene glycol  Xrays DG CHEST PORT 1 VIEW Result Date: 04/30/2023 CLINICAL DATA:  Pneumothorax EXAM: PORTABLE CHEST 1 VIEW COMPARISON:  Yesterday FINDINGS: Single remaining left chest tube with lateral pneumothorax that is small. Small right pleural effusion tracking along the lateral chest wall. Airspace disease in the left mid to lower lung. Stable heart size and mediastinal contours. There is a pericardial effusion by most recent CT. Right port with tip at the upper right atrium. IMPRESSION: Small lateral left pneumothorax. Remaining chest tube in stable position and more medial than the visible gas. Stable aeration. Electronically Signed   By: Tiburcio Pea M.D.   On: 04/30/2023 09:40   DG CHEST PORT 1 VIEW Result Date: 04/29/2023 CLINICAL DATA:  Pleural effusion on the left. History of recurrent effusion. EXAM: PORTABLE CHEST 1 VIEW COMPARISON:  04/28/2023. FINDINGS: Power port is unchanged with its tip in the proximal right atrium. Two left chest tubes remain in place. No pleural air scratch that there may be a small amount of pleural air at the inferior pleural space. No evidence of reaccumulation of pleural fluid. Persistent volume loss/infiltrate in the left lower lobe. Mild volume loss in the right lower lobe. IMPRESSION: Two left chest tubes remain in place. No evidence of reaccumulation of pleural fluid. Probable small amount of persistent pleural air at the left base laterally. Persistent volume loss/infiltrate in the left lower lobe.  Mild volume loss in the right lower lobe. Electronically Signed   By: Paulina Fusi M.D.   On: 04/29/2023 13:41    Assessment/Plan: S/P Procedure(s) (LRB): VIDEO ASSISTED THORACOSCOPY (VATS)/DECORTICATION (Left) DRAINAGE, PLEURAL EFFUSION (Bilateral) DRAINAGE, PERICARDIAL EFFUSION (N/A) RIGHT  PLEURAL CHEST TUBE INSERTION (Right) CREATION, PERICARDIAL WINDOW (N/A) POD#6  1 afeb, VSS, NSR, PVC's 2 O2 sats good on 2 liters [place] 3 UOP- appears adeq, not all measured. Normal renal fxn 4 CT 300 ml/24h, + air leak- keep tube in place 5 H/H 28/8.5, fairly stable  6 CXR- pending 7 medical management per primary 8 push pulm hygiene and rehab modalities as able   LOS: 8 days    Rowe Clack PA-C Pager [phone] 05/01/2023 Patient seen and examined, agree with above He did not have an air leak when I saw him, CXR stable with some residual space. Will clamp tube this AM and check a CXR at noon Wean O2  Viviann Spare C. Dorris Fetch, MD [facility] [phone]

## 2023-05-01 NOTE — Progress Notes (Signed)
 SATURATION QUALIFICATIONS: (This note is used to comply with regulatory documentation for home oxygen)  Patient Saturations on Room Air at Rest = 97%  Patient Saturations on Room Air while Ambulating = 91%    Jerolyn Center, PT Acute Rehabilitation Services  Office [phone]

## 2023-05-01 NOTE — Progress Notes (Signed)
 Physical Therapy Treatment Patient Details Name: Mitchell Frye MRN: [id number] DOB: [birth year] Today's Date: 05/01/2023   History of Present Illness Mr. Goncalves is a 73 year old male who presented to ED on 3/9 with SOB and R chest pain. Pt underwent left VATS with decortication and pericardial window on 3/11.  PMH:HTN, HLD, stage IIIb adenocarcinoma of the lung and a recurrent left pleural effusion causing chronic dyspnea.    PT Comments  Patient required min encouragement to participate. Educated in use of RW for transfers and gait with pt walking 160 ft with CGA. Patient on room air throughout with sats 91%.     If plan is discharge home, recommend the following: A little help with walking and/or transfers;A little help with bathing/dressing/bathroom;Help with stairs or ramp for entrance   Can travel by private vehicle        Equipment Recommendations  Rolling walker (2 wheels) (may not need)    Recommendations for Other Services       Precautions / Restrictions Precautions Precautions: Fall Restrictions Weight Bearing Restrictions Per Provider Order: No     Mobility  Bed Mobility               General bed mobility comments: from/to recliner    Transfers Overall transfer level: Needs assistance Equipment used: Rolling walker (2 wheels) Transfers: Sit to/from Stand Sit to Stand: Contact guard assist           General transfer comment: vc for correct hand placement with use of RW    Ambulation/Gait Ambulation/Gait assistance: Contact guard assist Gait Distance (Feet): 160 Feet Assistive device: Rolling walker (2 wheels) Gait Pattern/deviations: Decreased stride length Gait velocity: dec Gait velocity interpretation: 1.31 - 2.62 ft/sec, indicative of limited community ambulator   General Gait Details: pt with light use of RW, however with left drift with head turns   Optometrist     Tilt Bed    Modified Rankin  (Stroke Patients Only)       Balance Overall balance assessment: Needs assistance Sitting-balance support: No upper extremity supported, Feet supported Sitting balance-Leahy Scale: Fair     Standing balance support: No upper extremity supported Standing balance-Leahy Scale: Fair                              Hotel manager: No apparent difficulties  Cognition Arousal: Alert Behavior During Therapy: WFL for tasks assessed/performed                             Following commands: Intact      Cueing Cueing Techniques: Verbal cues  Exercises      General Comments General comments (skin integrity, edema, etc.): sats 91% on RA with ambulation      Pertinent Vitals/Pain Pain Assessment Pain Assessment: No/denies pain    Home Living                          Prior Function            PT Goals (current goals can now be found in the care plan section) Acute Rehab PT Goals Patient Stated Goal: improve pain Time For Goal Achievement: 05/10/23 Potential to Achieve Goals: Good Progress towards PT goals: Progressing toward goals    Frequency  Min 3X/week      PT Plan      Co-evaluation              AM-PAC PT "6 Clicks" Mobility   Outcome Measure  Help needed turning from your back to your side while in a flat bed without using bedrails?: A Little Help needed moving from lying on your back to sitting on the side of a flat bed without using bedrails?: A Little Help needed moving to and from a bed to a chair (including a wheelchair)?: A Little Help needed standing up from a chair using your arms (e.g., wheelchair or bedside chair)?: A Little Help needed to walk in hospital room?: A Little Help needed climbing 3-5 steps with a railing? : A Lot 6 Click Score: 17    End of Session Equipment Utilized During Treatment: Gait belt Activity Tolerance: Patient tolerated treatment well Patient left: in  chair;with call bell/phone within reach;with chair alarm set Nurse Communication: Mobility status;Other (comment) (results of sats test) PT Visit Diagnosis: Unsteadiness on feet (R26.81);Repeated falls (R29.6);Difficulty in walking, not elsewhere classified (R26.2)     Time: 1610-9604 PT Time Calculation (min) (ACUTE ONLY): 18 min  Charges:    $Gait Training: 8-22 mins PT General Charges $$ ACUTE PT VISIT: 1 Visit                      Jerolyn Center, PT Acute Rehabilitation Services  Office [phone]    Zena Amos 05/01/2023, 2:24 PM

## 2023-05-02 ENCOUNTER — Institutional Professional Consult (permissible substitution): Admitting: Student in an Organized Health Care Education/Training Program

## 2023-05-02 ENCOUNTER — Inpatient Hospital Stay (HOSPITAL_COMMUNITY)

## 2023-05-02 DIAGNOSIS — J90 Pleural effusion, not elsewhere classified: Secondary | ICD-10-CM | POA: Diagnosis not present

## 2023-05-02 LAB — CBC
HCT: 31.2 % — ABNORMAL LOW (ref 39.0–52.0)
Hemoglobin: 9.4 g/dL — ABNORMAL LOW (ref 13.0–17.0)
MCH: 22.2 pg — ABNORMAL LOW (ref 26.0–34.0)
MCHC: 30.1 g/dL (ref 30.0–36.0)
MCV: 73.8 fL — ABNORMAL LOW (ref 80.0–100.0)
Platelets: 402 10*3/uL — ABNORMAL HIGH (ref 150–400)
RBC: 4.23 MIL/uL (ref 4.22–5.81)
RDW: 19.3 % — ABNORMAL HIGH (ref 11.5–15.5)
WBC: 6.6 10*3/uL (ref 4.0–10.5)
nRBC: 0 % (ref 0.0–0.2)

## 2023-05-02 LAB — BASIC METABOLIC PANEL
Anion gap: 9 (ref 5–15)
BUN: 8 mg/dL (ref 8–23)
CO2: 27 mmol/L (ref 22–32)
Calcium: 8.4 mg/dL — ABNORMAL LOW (ref 8.9–10.3)
Chloride: 104 mmol/L (ref 98–111)
Creatinine, Ser: 1.04 mg/dL (ref 0.61–1.24)
GFR, Estimated: >60 mL/min (ref >60)
Glucose, Bld: 103 mg/dL — ABNORMAL HIGH (ref 70–99)
Potassium: 3.8 mmol/L (ref 3.5–5.1)
Sodium: 140 mmol/L (ref 135–145)

## 2023-05-02 NOTE — Progress Notes (Addendum)
 7 Days Post-Op Procedure(s) (LRB): VIDEO ASSISTED THORACOSCOPY (VATS)/DECORTICATION (Left) DRAINAGE, PLEURAL EFFUSION (Bilateral) DRAINAGE, PERICARDIAL EFFUSION (N/A) RIGHT PLEURAL CHEST TUBE INSERTION (Right) CREATION, PERICARDIAL WINDOW (N/A) Subjective: Feels ok, no current pain, some last night, not SOB  Objective: Vital signs in last 24 hours: Temp:  [97.8 F (36.6 C)-98.9 F (37.2 C)] 97.9 F (36.6 C) (03/18 0416) Pulse Rate:  [95-100] 95 (03/18 0416) Cardiac Rhythm: Normal sinus rhythm (03/17 2218) Resp:  [14-29] 20 (03/18 0416) BP: (104-132)/(67-87) 132/87 (03/18 0416) SpO2:  [92 %-99 %] 98 % (03/18 0416)  Hemodynamic parameters for last 24 hours:    Intake/Output from previous day: 03/17 0701 - 03/18 0700 In: 610 [P.O.:600; I.V.:10] Out: 1350 [Urine:1200; Chest Tube:150] Intake/Output this shift: No intake/output data recorded.  General appearance: alert, cooperative, and no distress Heart: regular rate and rhythm Lungs: clear to auscultation bilaterally Abdomen: benign Extremities: warm, no edema Wound: dressings CDI  Lab Results: Recent Labs    05/01/23 0212 05/02/23 0212  WBC 7.1 6.6  HGB 8.5* 9.4*  HCT 28.2* 31.2*  PLT 335 402*   BMET:  Recent Labs    05/01/23 0212 05/02/23 0212  NA 139 140  K 4.0 3.8  CL 107 104  CO2 25 27  GLUCOSE 103* 103*  BUN 8 8  CREATININE 1.04 1.04  CALCIUM 8.3* 8.4*    PT/INR: No results for input(s): "LABPROT", "INR" in the last 72 hours. ABG No results found for: "PHART", "HCO3", "TCO2", "ACIDBASEDEF", "O2SAT" CBG (last 3)  No results for input(s): "GLUCAP" in the last 72 hours.  Meds Scheduled Meds:  benzonatate  100 mg Oral BID   bisacodyl  10 mg Oral Daily   Chlorhexidine Gluconate Cloth  6 each Topical Daily   enoxaparin (LOVENOX) injection  40 mg Subcutaneous Q24H   feeding supplement  237 mL Oral BID BM   melatonin  5 mg Oral QHS   pantoprazole  40 mg Oral Daily   senna-docusate  1 tablet Oral  QHS   sodium chloride flush  3 mL Intravenous Q12H   Continuous Infusions: PRN Meds:.guaiFENesin, hydrOXYzine, LORazepam, morphine injection, ondansetron **OR** ondansetron (ZOFRAN) IV, mouth rinse, oxyCODONE, phenol, polyethylene glycol  Xrays DG Chest 1V REPEAT Same Day Result Date: 05/01/2023 CLINICAL DATA:  Empyema of the left pleural space. EXAM: CHEST - 1 VIEW SAME DAY COMPARISON:  X-ray 05/01/2023 earlier. FINDINGS: Stable right IJ chest port. Stable left chest tube. Stable patchy left mid lower lung opacities with presumed loculated lateral pneumothorax. Persistent right-sided pleural effusion. Stable cardiopericardial silhouette. Overlapping cardiac leads. Curvature of the spine. IMPRESSION: No significant interval change from previous. Electronically Signed   By: Karen Kays M.D.   On: 05/01/2023 15:51   DG CHEST PORT 1 VIEW Result Date: 05/01/2023 CLINICAL DATA:  Pneumothorax EXAM: PORTABLE CHEST 1 VIEW COMPARISON:  X-ray 04/30/2023 and older. FINDINGS: Right IJ chest port with tip along the SVC right atrial junction. Left-sided chest tube in place. Stable lateral left pneumothorax. Patchy left mid lower lung parenchymal opacities also similar. Tiny right effusion with some adjacent opacity. No edema. No right-sided effusion. Stable cardiopericardial silhouette. Overlapping cardiac leads. IMPRESSION: No significant oval change when adjusted for technique. Electronically Signed   By: Karen Kays M.D.   On: 05/01/2023 11:56    Assessment/Plan: S/P Procedure(s) (LRB): VIDEO ASSISTED THORACOSCOPY (VATS)/DECORTICATION (Left) DRAINAGE, PLEURAL EFFUSION (Bilateral) DRAINAGE, PERICARDIAL EFFUSION (N/A) RIGHT PLEURAL CHEST TUBE INSERTION (Right) CREATION, PERICARDIAL WINDOW (N/A) POD#7  1 afeb, VSS, NSR 2 O2 sats  ok on RA- appears to not need home O2 currently 3 voiding, not all measured, normal renal fxn 4 CT is out, CXR appears stable to my read 5 H/H stable, improved trend Medical  management per hospitalist, appears stable for discharge from surgical perspective    LOS: 9 days    Rowe Clack PA-C Pager [phone] 05/02/2023  Patient seen and examined.  Discussed with Mr. Doylene Canning.  Plan as outlined above. Has some residual space with incomplete reexpansion of the upper lobe.  Lower lobe appears pretty well reexpanded.  Will plan to see him back in the office in a couple of weeks with a chest x-ray.  Could ultimately require Pleurx catheter.  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  Follow-up with Dr. Smith Robert as an outpatient.  Salvatore Decent Dorris Fetch, MD [facility] [phone]

## 2023-05-02 NOTE — Anesthesia Postprocedure Evaluation (Signed)
 Anesthesia Post Note  Patient: Mitchell Frye  Procedure(s) Performed: VIDEO ASSISTED THORACOSCOPY (VATS)/DECORTICATION (Left: Chest) DRAINAGE, PLEURAL EFFUSION (Bilateral) DRAINAGE, PERICARDIAL EFFUSION (Chest) RIGHT PLEURAL CHEST TUBE INSERTION (Right: Chest) CREATION, PERICARDIAL WINDOW (Chest)     Patient location during evaluation: PACU Anesthesia Type: General Level of consciousness: awake, patient cooperative and confused Pain management: pain level controlled Vital Signs Assessment: post-procedure vital signs reviewed and stable Respiratory status: spontaneous breathing, nonlabored ventilation, respiratory function stable and patient connected to face mask oxygen Cardiovascular status: blood pressure returned to baseline and stable Postop Assessment: no apparent nausea or vomiting Anesthetic complications: no   No notable events documented.                Genieve Ramaswamy

## 2023-05-02 NOTE — Progress Notes (Signed)
 Occupational Therapy Treatment Patient Details Name: Mitchell Frye MRN: [id number] DOB: 1950/11/02 Today's Date: 05/02/2023   History of present illness [name] is a 73 year old male who presented to ED on 3/9 with SOB and R chest pain. Pt underwent left VATS with decortication and pericardial window on 3/11.  PMH:HTN, HLD, stage IIIb adenocarcinoma of the lung and a recurrent left pleural effusion causing chronic dyspnea.   OT comments  Pt making excellent progress towards OT goals. Pt received removing tele leads reporting MD told him he was going home today. Pt able to demonstrate brief bathing standing at sink, full dressing tasks and mobility around room w/o AD prepping for anticipated discharge. No LOB noted and SpO2 95% on RA w/ these tasks though some SOB observed. Educated on pulse oximeter and where to purchase to monitor O2/HR at home.       If plan is discharge home, recommend the following:  Direct supervision/assist for medications management;Direct supervision/assist for financial management;Assist for transportation   Equipment Recommendations  BSC/3in1;Tub/shower bench    Recommendations for Other Services      Precautions / Restrictions Precautions Precautions: Fall Restrictions Weight Bearing Restrictions Per Provider Order: No       Mobility Bed Mobility               General bed mobility comments: in recliner    Transfers Overall transfer level: Independent Equipment used: None Transfers: Sit to/from Stand Sit to Stand: Independent                 Balance Overall balance assessment: No apparent balance deficits (not formally assessed)                                         ADL either performed or assessed with clinical judgement   ADL Overall ADL's : Modified independent     Grooming: Modified independent;Standing;Wash/dry face   Upper Body Bathing: Modified independent;Standing   Lower Body Bathing: Modified  independent;Sit to/from stand   Upper Body Dressing : Modified independent   Lower Body Dressing: Modified independent                 General ADL Comments: Pt briefly washed standing at sink, dressed self for discharge and began gathering other items to place in personal belonging bag    Extremity/Trunk Assessment Upper Extremity Assessment Upper Extremity Assessment: Overall WFL for tasks assessed;Right hand dominant   Lower Extremity Assessment Lower Extremity Assessment: Defer to PT evaluation        Vision   Vision Assessment?: No apparent visual deficits   Perception     Praxis     Communication Communication Communication: No apparent difficulties   Cognition Arousal: Alert Behavior During Therapy: WFL for tasks assessed/performed Cognition: No family/caregiver present to determine baseline, Cognition impaired     Awareness: Intellectual awareness intact Memory impairment (select all impairments): Declarative long-term memory Attention impairment (select first level of impairment): Selective attention   OT - Cognition Comments: pleasant, attention deficits noted and pt perseverating on going home today (no DC orders in at time of OT session). educated on pulse ox and where to buy with pt unable to recall this written info at end of session - asked "is this a medicine?".                 Following commands: Intact  Cueing   Cueing Techniques: Verbal cues  Exercises      Shoulder Instructions       General Comments SpO2 95% on RA    Pertinent Vitals/ Pain       Pain Assessment Pain Assessment: No/denies pain  Home Living                                          Prior Functioning/Environment              Frequency  Min 1X/week        Progress Toward Goals  OT Goals(current goals can now be found in the care plan section)  Progress towards OT goals: Progressing toward goals  Acute Rehab OT  Goals Patient Stated Goal: go home today OT Goal Formulation: With patient Time For Goal Achievement: 05/11/23 Potential to Achieve Goals: Good ADL Goals Pt Will Perform Lower Body Bathing: sit to/from stand;sitting/lateral leans;with contact guard assist Pt Will Perform Lower Body Dressing: with contact guard assist;sitting/lateral leans;sit to/from stand Pt Will Transfer to Toilet: with supervision;ambulating;bedside commode Pt Will Perform Toileting - Clothing Manipulation and hygiene: with contact guard assist;sitting/lateral leans;sit to/from stand Additional ADL Goal #1: Patient will demonstrate ability to Independently state 3 energy conservation techniques to increase quality of life and safety and independence with functional tasks.  Plan      Co-evaluation                 AM-PAC OT "6 Clicks" Daily Activity     Outcome Measure   Help from another person eating meals?: None Help from another person taking care of personal grooming?: None Help from another person toileting, which includes using toliet, bedpan, or urinal?: None Help from another person bathing (including washing, rinsing, drying)?: None Help from another person to put on and taking off regular upper body clothing?: None Help from another person to put on and taking off regular lower body clothing?: None 6 Click Score: 24    End of Session    OT Visit Diagnosis: Unsteadiness on feet (R26.81);Other abnormalities of gait and mobility (R26.89);Muscle weakness (generalized) (M62.81);Other (comment)   Activity Tolerance Patient tolerated treatment well   Patient Left in chair;with call bell/phone within reach   Nurse Communication Mobility status        Time: 0981-1914 OT Time Calculation (min): 20 min  Charges: OT General Charges $OT Visit: 1 Visit OT Treatments $Self Care/Home Management : 8-22 mins  Bradd Canary, OTR/L Acute Rehab Services Office: [phone]   Lorre Munroe 05/02/2023,  10:28 AM

## 2023-05-02 NOTE — TOC Transition Note (Addendum)
 Transition of Care [facility]) - Discharge Note   Patient Details  Name: Mitchell Frye MRN: [id number] Date of Birth: 01-28-1951  Transition of Care [facility]) CM/SW Contact:  Lawerance Sabal, RN Phone Number: 05/02/2023, 11:54 AM   Clinical Narrative:     Spoke w patient at bedside to discuss DME needs, he would like RW, this has been ordered to be delivered to his room. He states his sister is on her way to get him.  HH services have been set up through [place].  Patient no longer qualifies for home O2, and this was acknowledged in [place]'s note.   Final next level of care: Home w Home Health Services Barriers to Discharge: No Barriers Identified   Patient Goals and CMS Choice Patient states their goals for this hospitalization and ongoing recovery are:: to go home CMS Medicare.gov Compare Post Acute Care list provided to:: Patient Choice offered to / list presented to : Patient      Discharge Placement                       Discharge Plan and Services Additional resources added to the After Visit Summary for                  DME Arranged: Walker rolling DME Agency: [employer] Date DME Agency Contacted: 05/02/23 Time DME Agency Contacted: 1154 Representative spoke with at DME Agency: Vaughan Basta HH Arranged: PT, OT HH Agency: [facility] Date [facility] Agency Contacted: 05/02/23 Time HH Agency Contacted: 1154 Representative spoke with at [facility] Agency: Kandee Keen  Social Drivers of Health (SDOH) Interventions SDOH Screenings   Food Insecurity: No Food Insecurity (04/23/2023)  Housing: High Risk (04/23/2023)  Transportation Needs: No Transportation Needs (04/23/2023)  Utilities: Not At Risk (04/23/2023)  Social Connections: Socially Isolated (04/23/2023)  Tobacco Use: Medium Risk (04/25/2023)     Readmission Risk Interventions     No data to display

## 2023-05-02 NOTE — Discharge Summary (Signed)
 Physician Discharge Summary   Patient: Mitchell Frye MRN: [id number] DOB: 07/27/1950  Admit date:     04/23/2023  Discharge date: 05/02/23  Discharge Physician: MDALA-GAUSI, Gwenette Greet   PCP: [facility], [facility]   Recommendations at discharge:   Follow-up with outpatient providers.  Discharge Diagnoses: Principal Problem:   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 Active Problems:   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)   Chest pain   Hypokalemia   Pericardial effusion   Malnutrition of moderate degree  Resolved Problems:   * No resolved hospital problems. [facility] Course:  73 year old M with PMH of stage IIIb NSCLC s/p chemoradiation in 2021 and palliative chemotherapy and radiation in 2023, recurrent left pleural effusion, hypertension and hyperlipidemia presented to [facility] ED on 3/9 with chest pain, progressive shortness of breath and chronic cough.  In ED, CXR revealed stable moderate size loculated left pleural effusion.  Cardiothoracic surgery consulted and patient was transferred to [facility].  CT chest without contrast showed increased small to moderate pericardial effusion and grossly stable moderate to large left pleural effusion with possible pleural thickening.    Patient had left VATS with decortication and pericardial window on 3/11.  Postoperatively, delayed emergence from anesthesia and confusion and transferred to ICU.  Patient also had right thoracocentesis with removal of 300 cc bloody appearing fluid by PCCM on 3/14.   Chest tube was kept in place and was removed on 3/18 after which the patient was discharged home.  The hospital course is in problem-based format below:    Acute hypoxemic respiratory failure due to recurrent malignant pleural effusion  Bilateral pleural effusion  Pericardial effusion with tamponade Stage IIIb NSCLC s/p chemoradiation in 2021 and palliative chemotherapy and radiation in 2023 -S/p VATS/decortication and chest  tube placement for left pleural effusion on 3/11 -S/p pericardial window/drain on 3/11 -S/p right thoracocentesis with removal of 300 cc bloody pleural effusion on 3/14 -Left pleural fluid cultures NGTD.  Cytology negative for malignant cells but visceral peel pathology with NSCLC -Cardiothoracic surgery was on board.  Patient had chest tube to waterseal, and this was removed on 05/02/2023.   -The patient was weaned off oxygen several days prior to discharge.   Anemia of chronic disease  Hemoglobin remained stable and was 9.4 at discharge.  Postoperative encephalopathy/confusion: Resolved. Patient's mental status was at baseline prior to discharge.   AKI:  -Baseline Cr ~1.1.   Creatinine increased and peaked at 1.41 after which it trended down to baseline. Likely prerenal AKI. Discharge creatinine was 1.04.  Nutrition Problem: Moderate Malnutrition Etiology: chronic illness      Consultants: CT surgery Procedures performed: Chest tube placement, thoracentesis Disposition: Home Diet recommendation:  Discharge Diet Orders (From admission, onward)     Start     Ordered   05/02/23 0000  Diet - low sodium heart healthy        05/02/23 1148           Regular diet DISCHARGE MEDICATION: Allergies as of 05/02/2023       Reactions   Taxol [paclitaxel] Other (See Comments)   Chest pain, hip pain, coughing , wheezing        Medication List     STOP taking these medications    Breztri Aerosphere 160-9-4.8 MCG/ACT Aero Generic drug: budeson-glycopyrrolate-formoterol   Flovent HFA 220 MCG/ACT inhaler Generic drug: fluticasone       TAKE these medications    albuterol 108 (90 Base) MCG/ACT inhaler Commonly  known as: VENTOLIN HFA Inhale 2 puffs into the lungs every 4 (four) hours as needed for wheezing or shortness of breath.   Anoro Ellipta 62.5-25 MCG/ACT Aepb Generic drug: umeclidinium-vilanterol INHALE 1 PUFF INTO THE LUNGS DAILY What changed:  when to  take this reasons to take this               Durable Medical Equipment  (From admission, onward)           Start     Ordered   05/01/23 1046  For home use only DME oxygen  Once       Question Answer Comment  Length of Need 6 Months   Mode or (Route) Nasal cannula   Liters per Minute 2   Frequency Continuous (stationary and portable oxygen unit needed)   Oxygen delivery system Gas      05/01/23 1046   04/28/23 0719  For home use only DME Walker rolling  Once       Question Answer Comment  Walker: With 5 Inch Wheels   Patient needs a walker to treat with the following condition Generalized weakness      04/28/23 0718            Follow-up Information     Loreli Slot, MD Follow up.   Specialty: Cardiothoracic Surgery Why: Please see discharge paperwork for details of follow-up appointment with surgeon. Contact information: [address] [place] [address] [phone]         [facility] AT [address] Follow up.   Specialty: Radiology Why: On the date you are scheduled to see Dr. Dorris Fetch in the office obtain a chest x-ray at [facility] 1 hour prior to that appointment. Contact information: [address] [place] [address] [phone]               Discharge Exam: Ceasar Mons Weights   04/28/23 0600 04/28/23 2008 04/30/23 0635  Weight: 81.3 kg 81.4 kg 80.8 kg   Physical Exam on Day of Discharge   General: Alert, cheerful, oriented X3  Oral cavity: moist mucous membranes  Neck: supple  Chest: Diminished breath sounds left side CVS: S1,S2 RRR. No murmurs  Abd: No distention, soft, non-tender. No masses palpable  Extr: No edema    Condition at discharge: stable  The results of significant diagnostics from this hospitalization (including imaging, microbiology, ancillary and laboratory) are listed below for reference.   Imaging Studies: DG Chest 1 View Result  Date: 05/02/2023 CLINICAL DATA:  Pneumothorax. EXAM: CHEST  1 VIEW COMPARISON:  Chest radiograph dated 05/01/2023. FINDINGS: Interval removal of left-sided chest tube. Similar appearance of a loculated pleural air in the lateral left mid lung field. No change in the left mid to lower lung field opacity. Similar positioning of right Port-A-Cath. Stable cardiomediastinal silhouette. No acute osseous pathology. IMPRESSION: Interval removal of left-sided chest tube. No change in the loculated pleural air in the lateral left mid lung field. Electronically Signed   By: Elgie Collard M.D.   On: 05/02/2023 09:42   DG Chest 1V REPEAT Same Day Result Date: 05/01/2023 CLINICAL DATA:  Empyema of the left pleural space. EXAM: CHEST - 1 VIEW SAME DAY COMPARISON:  X-ray 05/01/2023 earlier. FINDINGS: Stable right IJ chest port. Stable left chest tube. Stable patchy left mid lower lung opacities with presumed loculated lateral pneumothorax. Persistent right-sided pleural effusion. Stable cardiopericardial silhouette. Overlapping cardiac leads. Curvature of the spine. IMPRESSION: No significant  interval change from previous. Electronically Signed   By: Karen Kays M.D.   On: 05/01/2023 15:51   DG CHEST PORT 1 VIEW Result Date: 05/01/2023 CLINICAL DATA:  Pneumothorax EXAM: PORTABLE CHEST 1 VIEW COMPARISON:  X-ray 04/30/2023 and older. FINDINGS: Right IJ chest port with tip along the SVC right atrial junction. Left-sided chest tube in place. Stable lateral left pneumothorax. Patchy left mid lower lung parenchymal opacities also similar. Tiny right effusion with some adjacent opacity. No edema. No right-sided effusion. Stable cardiopericardial silhouette. Overlapping cardiac leads. IMPRESSION: No significant oval change when adjusted for technique. Electronically Signed   By: Karen Kays M.D.   On: 05/01/2023 11:56   DG CHEST PORT 1 VIEW Result Date: 04/30/2023 CLINICAL DATA:  Pneumothorax EXAM: PORTABLE CHEST 1 VIEW  COMPARISON:  Yesterday FINDINGS: Single remaining left chest tube with lateral pneumothorax that is small. Small right pleural effusion tracking along the lateral chest wall. Airspace disease in the left mid to lower lung. Stable heart size and mediastinal contours. There is a pericardial effusion by most recent CT. Right port with tip at the upper right atrium. IMPRESSION: Small lateral left pneumothorax. Remaining chest tube in stable position and more medial than the visible gas. Stable aeration. Electronically Signed   By: Tiburcio Pea M.D.   On: 04/30/2023 09:40   DG CHEST PORT 1 VIEW Result Date: 04/29/2023 CLINICAL DATA:  Pleural effusion on the left. History of recurrent effusion. EXAM: PORTABLE CHEST 1 VIEW COMPARISON:  04/28/2023. FINDINGS: Power port is unchanged with its tip in the proximal right atrium. Two left chest tubes remain in place. No pleural air scratch that there may be a small amount of pleural air at the inferior pleural space. No evidence of reaccumulation of pleural fluid. Persistent volume loss/infiltrate in the left lower lobe. Mild volume loss in the right lower lobe. IMPRESSION: Two left chest tubes remain in place. No evidence of reaccumulation of pleural fluid. Probable small amount of persistent pleural air at the left base laterally. Persistent volume loss/infiltrate in the left lower lobe. Mild volume loss in the right lower lobe. Electronically Signed   By: Paulina Fusi M.D.   On: 04/29/2023 13:41   DG CHEST PORT 1 VIEW Result Date: 04/28/2023 CLINICAL DATA:  Right-sided thoracentesis EXAM: PORTABLE CHEST 1 VIEW COMPARISON:  04/28/2023 FINDINGS: Single frontal view of the chest demonstrates stable right chest wall port. There are 2 left-sided chest tubes unchanged in position. There may be a small loculated pneumothorax at the left lung base unchanged since prior study. No right-sided pneumothorax. No significant pleural effusion. Continued consolidation in the left  perihilar and left lower lung zones compatible with atelectasis or pneumonia. No acute bony abnormalities. IMPRESSION: 1. Stable left-sided chest tubes, with likely small loculated left basilar pneumothorax unchanged since prior exam. 2. Continued consolidation within the left perihilar region and left lower lobe, which may reflect atelectasis or pneumonia. 3. No evidence of complication after reported interval right thoracentesis. No right-sided pneumothorax. Electronically Signed   By: Sharlet Salina M.D.   On: 04/28/2023 15:50   DG Chest Port 1 View Result Date: 04/28/2023 CLINICAL DATA:  Left-sided empyema. EXAM: PORTABLE CHEST 1 VIEW COMPARISON:  April 27, 2023. FINDINGS: Stable cardiomegaly. Right internal jugular Port-A-Cath is unchanged. Two left-sided chest tubes are noted with probable pneumothorax noted laterally which is slightly increased compared to prior exam. Stable bilateral lung opacities are noted. Pericardial drain is noted. IMPRESSION: Two left-sided chest tubes are again noted with small  left lateral pneumothorax which may be slightly increased compared to prior exam. Stable bilateral lung opacities. Electronically Signed   By: Lupita Raider M.D.   On: 04/28/2023 11:48   DG Chest Port 1 View Result Date: 04/27/2023 CLINICAL DATA:  Pleural effusion. EXAM: PORTABLE CHEST 1 VIEW COMPARISON:  April 26, 2023. FINDINGS: Stable cardiomegaly. Right internal jugular Port-A-Cath is unchanged. Small right pleural effusion is noted which is improved compared to prior exam with associated atelectasis. Two left-sided chest tubes are noted as well pericardial drain. Small pneumothorax is noted laterally. Left basilar atelectasis or pneumonia is noted. Small amount of subcutaneous emphysema is seen on the left. IMPRESSION: Stable cardiomegaly. Small right pleural effusion which is improved compared to prior exam. 2 left-sided chest tubes are again noted with associated pericardial drain. Small  pneumothorax is again noted laterally. Electronically Signed   By: Lupita Raider M.D.   On: 04/27/2023 09:41   DG Chest Port 1 View Result Date: 04/26/2023 CLINICAL DATA:  Status post left lung surgery EXAM: PORTABLE CHEST 1 VIEW COMPARISON:  04/25/2023 FINDINGS: Cardiac shadow remains enlarged. Right chest wall port is again seen. Small right-sided pleural effusion is noted and stable. Two chest tubes are noted on the left with a pericardial drain as well. The previously seen left-sided pneumothorax laterally and inferiorly has decreased although a mild lateral component remains. Some atelectatic changes in the left base are noted. Subcutaneous emphysema is seen. IMPRESSION: Reduction in left-sided pneumothorax with only a small lateral component remaining. Electronically Signed   By: Alcide Clever M.D.   On: 04/26/2023 10:50   DG Chest Port 1 View Result Date: 04/25/2023 CLINICAL DATA:  [id number] S/P lung surgery, follow-up exam [id number], chest tube placement EXAM: PORTABLE CHEST 1 VIEW COMPARISON:  04/25/2023 FINDINGS: Interval evacuation of the left pleural effusion with dual left chest tubes and probable pericardial drain in place. Incomplete re-expansion the left lung with pneumothorax ex vacuo noted loculated inferolaterally. Together, the findings suggest trapped lung. Moderate right pleural effusion appears unchanged. Associated right basilar compressive atelectasis again noted. No pneumothorax on the right. Interval placement of a pigtail chest tube in the region the right lung base or subdiaphragmatic region with exact localization difficult due to opacity. Cardiac size within normal limits. Right internal jugular chest port is unchanged with its tip at the superior cavoatrial junction. IMPRESSION: 1. Interval evacuation of the left pleural effusion with dual left chest tubes and probable pericardial drain in place. Incomplete re-expansion the left lung with pneumothorax ex vacuo noted loculated  inferolaterally. Together, the findings suggest trapped lung. 2. Moderate right pleural effusion with associated right basilar compressive atelectasis, unchanged. Interval placement of a pigtail chest tube in the region the right lung base or subdiaphragmatic region with exact localization difficult due to opacity. This may be better assessed with CT examination to more precisely localize chest tube location and assess the degree fluid loculation. Electronically Signed   By: Helyn Numbers M.D.   On: 04/25/2023 23:47   DG CHEST PORT 1 VIEW Result Date: 04/25/2023 CLINICAL DATA:  Shortness of breath. EXAM: PORTABLE CHEST 1 VIEW COMPARISON:  Radiograph 04/23/2023.  CT 04/24/2023 FINDINGS: Right chest port remains in place. Large left and moderate to large right pleural effusions, without significant interval change. Stable heart size and mediastinal contours. No pneumothorax. IMPRESSION: Large left and moderate to large right pleural effusions, without significant interval change. Electronically Signed   By: Narda Rutherford M.D.   On: 04/25/2023 10:47  CT CHEST WO CONTRAST Result Date: 04/24/2023 CLINICAL DATA:  Dyspnea, chronic, chest wall or pleura disease suspected. Non-small cell lung cancer. EXAM: CT CHEST WITHOUT CONTRAST TECHNIQUE: Multidetector CT imaging of the chest was performed following the standard protocol without IV contrast. RADIATION DOSE REDUCTION: This exam was performed according to the departmental dose-optimization program which includes automated exposure control, adjustment of the mA and/or kV according to patient size and/or use of iterative reconstruction technique. COMPARISON:  PET CT 12/12/2022, CT chest 03/23/2023 FINDINGS: Cardiovascular: Right chest wall Port-A-Cath with tip terminating at the superior caval junction. Small heart size. Slightly increased small to moderate pericardial effusion. Enlarged ascending thoracic aorta measuring up to 4.2 cm. Mild atherosclerotic plaque  of the thoracic aorta. No coronary artery calcifications. Mediastinum/Nodes: No gross hilar adenopathy, noting limited sensitivity for the detection of hilar adenopathy on this noncontrast study. No enlarged mediastinal or axillary lymph nodes. Thyroid gland, trachea, and esophagus demonstrate no significant findings. Lungs/Pleura: Almost complete collapse of the left lower lobe. Passive atelectasis of the left upper lobe. Passive atelectasis of the right lower lobe. Stable subpleural right 6 x 4 mm pulmonary nodule (3:71). No pulmonary mass interval increase in size of small to moderate right pleural effusion. Grossly stable moderate to large left pleural effusion with query pleural thickening-limited evaluation on this noncontrast study. No pneumothorax. Upper Abdomen: No acute abnormality. Musculoskeletal: No chest wall abnormality. No suspicious lytic or blastic osseous lesions. No acute displaced fracture. IMPRESSION: 1. Slightly increased small to moderate pericardial effusion. Small heart size with limited evaluation on this noncontrast study. 2. 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 Grossly stable moderate to large left pleural effusion with query pleural thickening-limited evaluation on this noncontrast study. 4. Limited evaluation this noncontrast study. These results will be called to the ordering clinician or representative by the Radiologist Assistant, and communication documented in the PACS or [employer]. Electronically Signed   By: Tish Frederickson M.D.   On: 04/24/2023 21:38   ECHOCARDIOGRAM COMPLETE Result Date: 04/24/2023    ECHOCARDIOGRAM REPORT   Patient Name:   MOLLY MASELLI Date of Exam: 04/24/2023 Medical Rec #:  [id number]    Height:       70.0 in Accession #:    [id number]   Weight:       183.6 lb Date of Birth:  [birth year]     BSA:          2.013 m Patient Age:    72 years     BP:           134/98 mmHg Patient Gender: M            HR:           109 bpm. Exam Location:   Inpatient Procedure: 2D Echo, Color Doppler and Cardiac Doppler (Both Spectral and Color            Flow Doppler were utilized during procedure). Indications:    I31.3 Pericardial effusion (noninflammatory)  History:        Patient has prior history of Echocardiogram examinations, most                 recent 11/11/2020. Risk Factors:Hypertension and Dyslipidemia.                 Left Lung Cancer.  Sonographer:    Irving Burton Senior RDCS Sonographer#2:  Rosaland Lao Referring Phys: [id number] Kateri Mc LATIF SHEIKH IMPRESSIONS  1. Left ventricular ejection fraction, by estimation, is 55 to  60%. Left ventricular ejection fraction by 2D MOD biplane is 43.4 %. The left ventricle has normal function. The left ventricle demonstrates global hypokinesis. There is mild asymmetric left  ventricular hypertrophy of the septal segment. Left ventricular diastolic parameters are indeterminate.  2. Right ventricular systolic function is normal. The right ventricular size is normal. Tricuspid regurgitation signal is inadequate for assessing PA pressure.  3. Significant respiratory variation in both mitral and tricuspid inflows, evidence of RV diastolic collapse, IVC is slightly collapsible but dilated consistent with early tamponade physiology. Large pericardial effusion. The pericardial effusion is circumferential. Findings are consistent with cardiac tamponade.  4. The mitral valve is normal in structure. Trivial mitral valve regurgitation.  5. The aortic valve is tricuspid. Aortic valve regurgitation is mild to moderate.  6. Aortic dilatation noted. There is mild dilatation of the aortic root, measuring 42 mm.  7. The inferior vena cava is dilated in size with <50% respiratory variability, suggesting right atrial pressure of 15 mmHg. Conclusion(s)/Recommendation(s): Large circumfrential pericardial effusion with evidenece of tamponade physiology. Ordering team will be notified. FINDINGS  Left Ventricle: Left ventricular ejection fraction,  by estimation, is 55 to 60%. Left ventricular ejection fraction by 2D MOD biplane is 43.4 %. The left ventricle has normal function. The left ventricle demonstrates global hypokinesis. The left ventricular internal cavity size was normal in size. There is mild asymmetric left ventricular hypertrophy of the septal segment. Left ventricular diastolic parameters are indeterminate. Right Ventricle: The right ventricular size is normal. No increase in right ventricular wall thickness. Right ventricular systolic function is normal. Tricuspid regurgitation signal is inadequate for assessing PA pressure. Left Atrium: Left atrial size was normal in size. Right Atrium: Right atrial size was normal in size. Pericardium: Significant respiratory variation in both mitral and tricuspid inflows, evidence of RV diastolic collapse, IVC is slightly collapsible but dilated consistent with early tamponade physiology. A large pericardial effusion is present. The pericardial effusion is circumferential. There is evidence of cardiac tamponade. Thickening/calcification of pericardium present. Mitral Valve: The mitral valve is normal in structure. Trivial mitral valve regurgitation. Tricuspid Valve: The tricuspid valve is normal in structure. Tricuspid valve regurgitation is trivial. Aortic Valve: The aortic valve is tricuspid. Aortic valve regurgitation is mild to moderate. Pulmonic Valve: The pulmonic valve was normal in structure. Pulmonic valve regurgitation is trivial. Aorta: The aortic root and ascending aorta are structurally normal, with no evidence of dilitation and aortic dilatation noted. There is mild dilatation of the aortic root, measuring 42 mm. Venous: The inferior vena cava is dilated in size with less than 50% respiratory variability, suggesting right atrial pressure of 15 mmHg. The inferior vena cava and the hepatic vein show a systolic blunting flow pattern. IAS/Shunts: No atrial level shunt detected by color flow  Doppler. Additional Comments: There is pleural effusion in the left lateral region.  LEFT VENTRICLE PLAX 2D                        Biplane EF (MOD) LVIDd:         4.10 cm         LV Biplane EF:   Left LVIDs:         2.60 cm                          ventricular LV PW:         0.90 cm  ejection LV IVS:        1.10 cm                          fraction by LVOT diam:     2.20 cm                          2D MOD LV SV:         42                               biplane is LV SV Index:   21                               43.4 %. LVOT Area:     3.80 cm  LV Volumes (MOD) LV vol d, MOD    75.3 ml A2C: LV vol d, MOD    56.9 ml A4C: LV vol s, MOD    40.3 ml A2C: LV vol s, MOD    32.7 ml A4C: LV SV MOD A2C:   35.0 ml LV SV MOD A4C:   56.9 ml LV SV MOD BP:    28.6 ml RIGHT VENTRICLE RV S prime:     16.00 cm/s TAPSE (M-mode): 1.8 cm LEFT ATRIUM             Index        RIGHT ATRIUM           Index LA diam:        3.40 cm 1.69 cm/m   RA Area:     12.30 cm LA Vol (A2C):   61.9 ml 30.75 ml/m  RA Volume:   26.90 ml  13.36 ml/m LA Vol (A4C):   18.3 ml 9.09 ml/m LA Biplane Vol: 33.2 ml 16.49 ml/m  AORTIC VALVE LVOT Vmax:   74.40 cm/s LVOT Vmean:  55.033 cm/s LVOT VTI:    0.111 m  AORTA Ao Root diam: 4.20 cm Ao Asc diam:  3.70 cm  SHUNTS Systemic VTI:  0.11 m Systemic Diam: 2.20 cm Arvilla Meres MD Electronically signed by Arvilla Meres MD Signature Date/Time: 04/24/2023/2:53:34 PM    Final    DG Chest 2 View Result Date: 04/23/2023 CLINICAL DATA:  Shortness of breath for 1 month.  Chest pain. EXAM: CHEST - 2 VIEW COMPARISON:  February 06, 2023. FINDINGS: Stable cardiomediastinal silhouette. Right internal jugular Port-A-Cath is unchanged. Stable moderate size loculated left pleural effusion. Hypoinflation of the lungs is noted with minimal right basilar subsegmental atelectasis. Bony thorax is unremarkable. IMPRESSION: Stable moderate size loculated left pleural effusion. Hypoinflation of the lungs with  minimal right basilar subsegmental atelectasis. Electronically Signed   By: Lupita Raider M.D.   On: 04/23/2023 12:34    Microbiology: Results for orders placed or performed during the hospital encounter of 04/23/23  Surgical pcr screen     Status: None   Collection Time: 04/25/23 11:04 AM   Specimen: Nasal Mucosa; Nasal Swab  Result Value Ref Range Status   MRSA, PCR NEGATIVE NEGATIVE Final   Staphylococcus aureus NEGATIVE NEGATIVE Final    Comment: (NOTE) The Xpert SA Assay (FDA approved for NASAL specimens in patients 39 years of age and older), is one component of a comprehensive surveillance program. It is not intended to diagnose infection nor to  guide or monitor treatment. Performed at [facility] [facility], [address] [place], [place] [address]   Fungus Culture With Stain     Status: None (Preliminary result)   Collection Time: 04/25/23  5:41 PM   Specimen: PATH Soft tissue  Result Value Ref Range Status   Fungus Stain Final report  Final    Comment: (NOTE) Performed At: [facility] [address] [place], [place] [id number] Jolene Schimke MD GN:[id number]    Fungus (Mycology) Culture PENDING  Incomplete   Fungal Source TISSUE  Final    Comment: LEFT PLEURAL PEEL Performed at [facility], [address] [place], [place] [address]   Aerobic/Anaerobic Culture w Gram Stain (surgical/deep wound)     Status: None   Collection Time: 04/25/23  5:41 PM   Specimen: PATH Soft tissue  Result Value Ref Range Status   Specimen Description TISSUE LEFT PLEURAL  Final   Special Requests PEEL  Final   Gram Stain NO WBC SEEN NO ORGANISMS SEEN   Final   Culture   Final    No growth aerobically or anaerobically. Performed at [facility], [address], [place], [place] [address]    Report Status 04/30/2023 FINAL  Final  Acid Fast Smear (AFB)     Status: None   Collection Time: 04/25/23  5:41 PM   Specimen: PATH Soft tissue  Result Value Ref Range  Status   AFB Specimen Processing Concentration  Final   Acid Fast Smear Negative  Final    Comment: (NOTE) Performed At: [facility] [address] [place], [place] [id number] Jolene Schimke MD UV:[id number]    Source (AFB) TISSUE  Final    Comment: LEFT PLEURAL PEEL Performed at [facility], [address], [place], [place] [address]   Fungus Culture Result     Status: None   Collection Time: 04/25/23  5:41 PM  Result Value Ref Range Status   Result 1 Comment  Final    Comment: (NOTE) KOH/Calcofluor preparation:  no fungus observed. Performed At: [facility] [address] [place], [place] [id number] Jolene Schimke MD PI:[id number]     Labs: CBC: Recent Labs  Lab 04/26/23 0425 04/27/23 0325 04/28/23 1012 04/29/23 0216 04/30/23 0209 05/01/23 0212 05/02/23 0212  WBC 11.2*   < > 7.6 8.6 8.0 7.1 6.6  NEUTROABS 9.6*  --   --   --   --   --   --   HGB 9.6*   < > 8.7* 9.1* 9.0* 8.5* 9.4*  HCT 31.8*   < > 28.9* 30.7* 30.5* 28.2* 31.2*  MCV 75.7*   < > 75.5* 75.6* 74.6* 73.8* 73.8*  PLT 352   < > 360 335 362 335 402*   < > = values in this interval not displayed.   Basic Metabolic Panel: Recent Labs  Lab 04/26/23 0425 04/27/23 0325 04/28/23 1012 04/29/23 0216 04/30/23 0209 05/01/23 0212 05/02/23 0212  NA 139   < > 139 140 140 139 140  K 4.2   < > 3.7 3.4* 3.6 4.0 3.8  CL 110   < > 108 105 104 107 104  CO2 24   < > 24 27 27 25 27   GLUCOSE 102*   < > 91 96 95 103* 103*  BUN 14   < > 13 12 10 8 8   CREATININE 1.41*   < > 1.14 1.07 0.99 1.04 1.04  CALCIUM 8.4*   < > 8.3*  8.2* 8.5* 8.3* 8.4*  MG 1.7  --  2.1 2.0 2.0  --   --   PHOS 3.2  --  2.5  --  2.7  --   --    < > = values in this interval not displayed.   Liver Function Tests: Recent Labs  Lab 04/26/23 0425 04/27/23 0325 04/28/23 1012 04/30/23 0209  AST 35 25  --   --   ALT 19 14  --   --   ALKPHOS 47 42  --   --   BILITOT 1.3* 1.6*  --   --   PROT 6.0* 6.3*  --   --    ALBUMIN 2.0* 3.1* 2.3* 2.3*   CBG: Recent Labs  Lab 04/25/23 2200  GLUCAP 114*    Discharge time spent: greater than 30 minutes.  Signed: MDALA-GAUSI, Gwenette Greet, MD [facility] 05/02/2023

## 2023-05-08 ENCOUNTER — Ambulatory Visit
Admission: RE | Admit: 2023-05-08 | Discharge: 2023-05-08 | Disposition: A | Discharge status: Home / Self Care | Payer: 59 | Source: Ambulatory Visit | Attending: Oncology | Admitting: Oncology

## 2023-05-08 DIAGNOSIS — E785 Hyperlipidemia, unspecified: Secondary | ICD-10-CM | POA: Diagnosis not present

## 2023-05-08 DIAGNOSIS — C3432 Malignant neoplasm of lower lobe, left bronchus or lung: Secondary | ICD-10-CM | POA: Insufficient documentation

## 2023-05-08 DIAGNOSIS — R0600 Dyspnea, unspecified: Secondary | ICD-10-CM | POA: Insufficient documentation

## 2023-05-08 DIAGNOSIS — D6481 Anemia due to antineoplastic chemotherapy: Secondary | ICD-10-CM | POA: Insufficient documentation

## 2023-05-08 DIAGNOSIS — I10 Essential (primary) hypertension: Secondary | ICD-10-CM | POA: Insufficient documentation

## 2023-05-08 DIAGNOSIS — I082 Rheumatic disorders of both aortic and tricuspid valves: Secondary | ICD-10-CM | POA: Insufficient documentation

## 2023-05-08 DIAGNOSIS — T451X5A Adverse effect of antineoplastic and immunosuppressive drugs, initial encounter: Secondary | ICD-10-CM | POA: Insufficient documentation

## 2023-05-08 LAB — ECHOCARDIOGRAM COMPLETE
AR max vel: 3.49 cm2
AV Area VTI: 3.44 cm2
AV Area mean vel: 3.56 cm2
AV Mean grad: 3 mmHg
AV Peak grad: 5 mmHg
Ao pk vel: 1.12 m/s
Area-P 1/2: 4.08 cm2
S' Lateral: 2.5 cm

## 2023-05-08 NOTE — Progress Notes (Signed)
*  PRELIMINARY RESULTS* Echocardiogram 2D Echocardiogram has been performed.  Cristela Blue 05/08/2023, 11:38 AM

## 2023-05-15 ENCOUNTER — Ambulatory Visit: Admitting: Pulmonary Disease

## 2023-05-15 ENCOUNTER — Other Ambulatory Visit: Payer: Self-pay | Admitting: Thoracic Surgery (Cardiothoracic Vascular Surgery)

## 2023-05-15 ENCOUNTER — Encounter: Payer: Self-pay | Admitting: Pulmonary Disease

## 2023-05-15 VITALS — BP 128/80 | HR 101 | Resp 18 | Ht 70.0 in | Wt 157.0 lb

## 2023-05-15 DIAGNOSIS — J449 Chronic obstructive pulmonary disease, unspecified: Secondary | ICD-10-CM

## 2023-05-15 DIAGNOSIS — C3432 Malignant neoplasm of lower lobe, left bronchus or lung: Secondary | ICD-10-CM | POA: Diagnosis not present

## 2023-05-15 DIAGNOSIS — J90 Pleural effusion, not elsewhere classified: Secondary | ICD-10-CM

## 2023-05-15 DIAGNOSIS — Z923 Personal history of irradiation: Secondary | ICD-10-CM | POA: Diagnosis not present

## 2023-05-15 DIAGNOSIS — J910 Malignant pleural effusion: Secondary | ICD-10-CM

## 2023-05-15 DIAGNOSIS — R0602 Shortness of breath: Secondary | ICD-10-CM

## 2023-05-15 NOTE — Progress Notes (Signed)
 Synopsis: Referred in by Creig Hines, MD   Subjective:   PATIENT ID: Mitchell Frye GENDER: male DOB: 24-Jun-1950, MRN: [id number]  Chief Complaint  Patient presents with   New Patient (Initial Visit)    Pleural effusion- unsure who sent here and why. States not on any medications at this time.     HPI Mitchell Frye is a 73 year old male patient with a past medical history of heavy tobacco use, stage IIIb non-small cell lung cancer status post chemoradiation followed by durvalumab course complicated by recurrence in 2023 status post chemoradiation presenting today to the pulmonary clinic for follow-up on pleural effusion.  He had recurrent left-sided pleural effusion status post multiple thoracentesis with cytology negative every time.  He has been recommended VATS but this has been delayed up until 3 weeks ago where he underwent VATS with left decortication and pericardial window.  Pleural pathology with fibrin fibrinous pleuritis, pericardium with mild fibrosis and chronic inflammation.    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   He is feeling significantly better in terms of breathing since his surgery.   [place] in office of the left hemithorax without any signs of pleural effusion.   ROS All systems were reviewed and are negative except for the above.  Objective:   Vitals:   05/15/23 1256  BP: 128/80  Pulse: (!) 101  Resp: 18  SpO2: 98%  Weight: 157 lb (71.2 kg)  Height: 5\' 10"  (1.778 m)   98% on RA BMI Readings from Last 3 Encounters:  05/15/23 22.53 kg/m  04/30/23 25.56 kg/m  04/23/23 24.39 kg/m   Wt Readings from Last 3 Encounters:  05/15/23 157 lb (71.2 kg)  04/30/23 178 lb 2.1 oz (80.8 kg)  04/23/23 170 lb (77.1 kg)    Physical Exam GEN: NAD, Healthy Appearing HEENT: Supple Neck, Reactive Pupils, EOMI  CVS: Normal S1, Normal S2, RRR, No murmurs or ES appreciated  Lungs: Diminished at the  left base. No wheezing appreciated.  Abdomen: Soft, non tender, non distended, + BS  Extremities: Warm and well perfused, No edema  Skin: No suspicious lesions appreciated  Psych: Normal Affect  Ancillary Information   CBC    Component Value Date/Time   WBC 6.6 05/02/2023 0212   RBC 4.23 05/02/2023 0212   HGB 9.4 (L) 05/02/2023 0212   HGB 8.8 (L) 12/05/2022 0945   HCT 31.2 (L) 05/02/2023 0212   PLT 402 (H) 05/02/2023 0212   PLT 484 (H) 12/05/2022 0945   MCV 73.8 (L) 05/02/2023 0212   MCH 22.2 (L) 05/02/2023 0212   MCHC 30.1 05/02/2023 0212   RDW 19.3 (H) 05/02/2023 0212   LYMPHSABS 0.7 04/26/2023 0425   MONOABS 0.8 04/26/2023 0425   EOSABS 0.1 04/26/2023 0425   BASOSABS 0.0 04/26/2023 0425        No data to display         PFTs in 2022 with nonspecific ventilatory defect, decreaesd PEFR and gas trapping.   Assessment & Plan:  Mitchell Frye is a 73 year old male patient with a past medical history of heavy tobacco use, stage IIIb non-small cell lung cancer status post chemoradiation followed by durvalumab course complicated by recurrence in 2023 status post chemoradiation presenting today to the pulmonary clinic for follow-up on pleural effusion.  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   #Clinical diagnosis of COPD with poor performance on PFTs in 2022 that showed severe ventilatory defect with gas trapping.   []  Patient should be on LABA-LAMA at the minimum but reportedly was told not  to use inhalers during his last hospitalization. Will confirm with thoracic surgery prior to starting maintenance inhaler.  []  Will obtain repeat PFTs.  []  Will refer to pulmonary rehab as well.   Return in about 3 months (around 08/14/2023).  I spent 60 minutes caring for this patient today, including preparing to see the patient,  obtaining a medical history , reviewing a separately obtained history, performing a medically appropriate examination and/or evaluation, counseling and educating the patient/family/caregiver, ordering medications, tests, or procedures, documenting clinical information in the electronic health record, and independently interpreting results (not separately reported/billed) and communicating results to the patient/family/caregiver  Janann Colonel, MD [facility] 05/15/2023 1:35 PM

## 2023-05-16 ENCOUNTER — Ambulatory Visit (INDEPENDENT_AMBULATORY_CARE_PROVIDER_SITE_OTHER): Payer: Self-pay | Admitting: Thoracic Surgery (Cardiothoracic Vascular Surgery)

## 2023-05-16 ENCOUNTER — Encounter: Payer: Self-pay | Admitting: Pulmonary Disease

## 2023-05-16 ENCOUNTER — Encounter: Payer: Self-pay | Admitting: Thoracic Surgery (Cardiothoracic Vascular Surgery)

## 2023-05-16 ENCOUNTER — Ambulatory Visit
Admission: RE | Admit: 2023-05-16 | Discharge: 2023-05-16 | Disposition: A | Discharge status: Home / Self Care | Source: Ambulatory Visit | Attending: Thoracic Surgery (Cardiothoracic Vascular Surgery) | Admitting: Thoracic Surgery (Cardiothoracic Vascular Surgery)

## 2023-05-16 VITALS — BP 160/95 | HR 92 | Resp 20 | Ht 70.0 in | Wt 157.0 lb

## 2023-05-16 DIAGNOSIS — J90 Pleural effusion, not elsewhere classified: Secondary | ICD-10-CM

## 2023-05-16 DIAGNOSIS — C3432 Malignant neoplasm of lower lobe, left bronchus or lung: Secondary | ICD-10-CM

## 2023-05-16 MED ORDER — UMECLIDINIUM-VILANTEROL 62.5-25 MCG/ACT IN AEPB
1.0000 | INHALATION_SPRAY | Freq: Every day | RESPIRATORY_TRACT | 3 refills | Status: DC
Start: 2023-05-16 — End: 2023-10-11

## 2023-05-16 NOTE — Progress Notes (Signed)
 [address]       [name] [address]             [phone]     HPI: Mr. Raschke returns for follow-up after recent decortication and pericardial window.  Smayan Hackbart is a 73 year old man with a history of stage IIIb lung cancer treated with chemo and radiation, hypertension, hyperlipidemia, chronic dyspnea, and a chronic left pleural effusion.  He presented in October with weight loss and shortness of breath.  Was found to have a large left pleural effusion.  Had thoracentesis and cytology was negative.  He refused VATS at that time.  He was admitted to the hospital in early March with complaints of progressive dyspnea.  Found to have moderately large pericardial effusion in addition to his chronic left pleural effusion and a smaller right pleural effusion.  I did a left VATS for a pericardial window and drainage and decortication of the left lung.  His left lower lobe would not completely reexpand.  He went home on postoperative day #7.  He is only taking Tylenol for pain.  Still complains of feeling weak and tired.  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 (HCC)     Current Outpatient Medications  Medication Sig Dispense Refill   albuterol (VENTOLIN HFA) 108 (90 Base) MCG/ACT inhaler Inhale 2 puffs into the lungs every 4 (four) hours as needed for wheezing or shortness of breath. (Patient not taking: Reported on 05/15/2023) 8 g 1   ANORO ELLIPTA 62.5-25 MCG/ACT AEPB INHALE 1 PUFF INTO THE LUNGS DAILY (Patient not taking: Reported on 05/15/2023) 60 each 2   No current facility-administered medications for this visit.    Physical Exam BP (!) 160/95   Pulse 92   Resp 20   Ht 5\' 10"  (1.778 m)   Wt 157 lb (71.2 kg)   SpO2 95% Comment: RA  BMI 22.23 kg/m  73 year old man in no acute distress Alert and oriented x 3 Gait slightly unsteady Lungs slightly  diminished at left, otherwise clear Cardiac regular rate and rhythm with no rub  Diagnostic Tests: I personally reviewed his chest x-ray images.  Normal cardiac silhouette.  No right pleural effusion.  Space superior laterally and left chest with no significant fluid.  Impression: Muscab Brenneman is a 73 year old man with a history of stage IIIb lung cancer treated with chemo and radiation, hypertension, hyperlipidemia, chronic dyspnea, and a chronic left pleural effusion.    Stage IIIb adenocarcinoma lung status post chemoradiation in 2023.  Now with recurrence noted in left pleural space.  He sees Dr. Smith Robert tomorrow.  From a surgical standpoint he is doing well.  There is a residual space on the left where the upper lobe would not completely reexpand.  May also be a smaller space inferiorly.  Likely will fill in with fluid ultimately.  I think the result is about the best we could hope for.  Symptomatically he is doing well.  There was some question about certain inhalers he should not use.  I am not sure where that came from but I have no issue with whichever inhaler pulmonology feels is appropriate.  Plan: Follow-up with Dr. Smith Robert I will plan to see him back in about 6 weeks with a PA and lateral chest x-ray to check on his progress.  Loreli Slot, MD [facility] [phone]

## 2023-05-16 NOTE — Addendum Note (Signed)
 Addended by: Janann Colonel on: 05/16/2023 12:00 PM   Modules accepted: Orders

## 2023-05-17 ENCOUNTER — Encounter: Payer: Self-pay | Admitting: Oncology

## 2023-05-17 ENCOUNTER — Inpatient Hospital Stay: Attending: Oncology | Admitting: Oncology

## 2023-05-17 VITALS — BP 133/84 | HR 106 | Temp 96.0°F | Resp 18 | Ht 70.0 in | Wt 156.2 lb

## 2023-05-17 DIAGNOSIS — Z5111 Encounter for antineoplastic chemotherapy: Secondary | ICD-10-CM | POA: Insufficient documentation

## 2023-05-17 DIAGNOSIS — I119 Hypertensive heart disease without heart failure: Secondary | ICD-10-CM | POA: Insufficient documentation

## 2023-05-17 DIAGNOSIS — Z7189 Other specified counseling: Secondary | ICD-10-CM

## 2023-05-17 DIAGNOSIS — I083 Combined rheumatic disorders of mitral, aortic and tricuspid valves: Secondary | ICD-10-CM | POA: Diagnosis not present

## 2023-05-17 DIAGNOSIS — E785 Hyperlipidemia, unspecified: Secondary | ICD-10-CM | POA: Insufficient documentation

## 2023-05-17 DIAGNOSIS — J90 Pleural effusion, not elsewhere classified: Secondary | ICD-10-CM | POA: Diagnosis not present

## 2023-05-17 DIAGNOSIS — C3432 Malignant neoplasm of lower lobe, left bronchus or lung: Secondary | ICD-10-CM | POA: Insufficient documentation

## 2023-05-17 DIAGNOSIS — J869 Pyothorax without fistula: Secondary | ICD-10-CM | POA: Diagnosis not present

## 2023-05-17 DIAGNOSIS — Z923 Personal history of irradiation: Secondary | ICD-10-CM | POA: Insufficient documentation

## 2023-05-17 DIAGNOSIS — C782 Secondary malignant neoplasm of pleura: Secondary | ICD-10-CM | POA: Insufficient documentation

## 2023-05-17 DIAGNOSIS — Z79899 Other long term (current) drug therapy: Secondary | ICD-10-CM | POA: Insufficient documentation

## 2023-05-17 DIAGNOSIS — I3139 Other pericardial effusion (noninflammatory): Secondary | ICD-10-CM | POA: Diagnosis not present

## 2023-05-17 DIAGNOSIS — I10 Essential (primary) hypertension: Secondary | ICD-10-CM | POA: Insufficient documentation

## 2023-05-17 DIAGNOSIS — Z87891 Personal history of nicotine dependence: Secondary | ICD-10-CM | POA: Diagnosis not present

## 2023-05-17 MED ORDER — ALBUTEROL SULFATE HFA 108 (90 BASE) MCG/ACT IN AERS: 2.0000 | INHALATION_SPRAY | RESPIRATORY_TRACT | 1 refills | Status: DC | PRN

## 2023-05-17 MED ORDER — ONDANSETRON HCL 8 MG PO TABS: 8.0000 mg | ORAL_TABLET | Freq: Three times a day (TID) | ORAL | 1 refills | Status: DC | PRN

## 2023-05-17 MED ORDER — OXYCODONE HCL 5 MG PO TABS: 5.0000 mg | ORAL_TABLET | Freq: Three times a day (TID) | ORAL | 0 refills | Status: DC | PRN

## 2023-05-17 MED ORDER — PROCHLORPERAZINE MALEATE 10 MG PO TABS: 10.0000 mg | ORAL_TABLET | Freq: Four times a day (QID) | ORAL | 1 refills | Status: DC | PRN

## 2023-05-17 MED ORDER — LIDOCAINE-PRILOCAINE 2.5-2.5 % EX CREA: TOPICAL_CREAM | CUTANEOUS | 3 refills | Status: DC

## 2023-05-17 NOTE — Progress Notes (Signed)
 Pharmacist Chemotherapy Monitoring - Initial Assessment    Anticipated start date: 05/24/23  The following has been reviewed per standard work regarding the patient's treatment regimen: The patient's diagnosis, treatment plan and drug doses, and organ/hematologic function Lab orders and baseline tests specific to treatment regimen  The treatment plan start date, drug sequencing, and pre-medications Prior authorization status  Patient's documented medication list, including drug-drug interaction screen and prescriptions for anti-emetics and supportive care specific to the treatment regimen The drug concentrations, fluid compatibility, administration routes, and timing of the medications to be used The patient's access for treatment and lifetime cumulative dose history, if applicable  The patient's medication allergies and previous infusion related reactions, if applicable   Changes made to treatment plan:  N/A  Follow up needed:  Pending authorization for treatment    Sharen Hones, PharmD, BCPS Clinical Pharmacist   05/17/2023  3:13 PM

## 2023-05-17 NOTE — Progress Notes (Signed)
 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  No evidence of distant metastatic disease.  Pathology was consistent with non-small cell lung cancer favor adenocarcinoma.  Cells were positive for TTF-1 and negative for p40.  He received 1 cycle of CarboTaxol and then had significant reaction to Taxol.  He was switched to Abraxane for 3 more cycles but due to shortage of Abraxane he was switched to [place] Alimta for 2 cycles ending in October 2021 concurrent with radiation.  Subsequently he was started on maintenance durvalumab which she continued until September 2022.    CT scans in October 2022 showed concern for hilar recurrence and patient was retreated with [place] Alimta followed by maintenance Alimta which she received until December 2023.  He has been off treatment since then due to stable disease but Alimta was also complicated by worsening anemia and AKI.  Skains while he was on Alimta showed mild to moderate left pleural effusion.  He has had multiple thoracentesis in the past which were all negative for malignancy. PET scan in October 2024 did not show any evidence of  recurrent or progressive disease elsewhere but showed large left pleural effusion with complete left lower lobe collapse.  Also noted to have small to moderate pericardial effusion on his CT scans.   He was admitted to [facility] and underwentVATS procedure with decortication and drainage of pleural and pericardial effusion.  Cytology from thoracentesis as well as pericardial fluid was negative for malignancy.  Pericardial biopsy showed nonspecific chronic inflammation.  However his visceral pleural biopsy showed scant foci of non-small cell carcinoma only within lymphovascular space.  NGS testing back in 2021 showed no evidence of actionable mutations.  Tumor mutational burden 0.  MSI stable PD-L1 10%    Interval history-patient has occasional exertional chest wall pain since his VATS procedure.  He is living with his 2 brothers and sister in [place].  Feels fatigued.  ECOG PS- 1 Pain scale- 3 Opioid associated constipation- no  Review of systems- Review of Systems  Constitutional:  Positive for malaise/fatigue.  Respiratory:         Left chest wall pain      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 [facility])      Past Surgical History:  Procedure Laterality Date   CHEST TUBE INSERTION Right 04/25/2023   Procedure: RIGHT PLEURAL CHEST TUBE INSERTION;  Surgeon: Loreli Slot, MD;  Location: [facility] OR;  Service: Thoracic;  Laterality: Right;   ESOPHAGOGASTRODUODENOSCOPY (EGD)  WITH PROPOFOL N/A 12/01/2020   Procedure: ESOPHAGOGASTRODUODENOSCOPY (EGD) WITH PROPOFOL;  Surgeon: Toney Reil, MD;  Location: [facility] ENDOSCOPY;  Service: Gastroenterology;  Laterality: N/A;   IR THORACENTESIS ASP PLEURAL SPACE W/IMG GUIDE  12/26/2022   PERICARDIAL FLUID DRAINAGE  N/A 04/25/2023   Procedure: DRAINAGE, PERICARDIAL EFFUSION;  Surgeon: Loreli Slot, MD;  Location: [facility] OR;  Service: Thoracic;  Laterality: N/A;   PERICARDIAL WINDOW N/A 04/25/2023   Procedure: CREATION, PERICARDIAL WINDOW;  Surgeon: Loreli Slot, MD;  Location: [facility] OR;  Service: Thoracic;  Laterality: N/A;   PLEURAL EFFUSION DRAINAGE Bilateral 04/25/2023   Procedure: DRAINAGE, PLEURAL EFFUSION;  Surgeon: Loreli Slot, MD;  Location: [facility] OR;  Service: Thoracic;  Laterality: Bilateral;   PORTA CATH INSERTION N/A 10/22/2019   Procedure: PORTA CATH INSERTION;  Surgeon: Renford Dills, MD;  Location: [facility] INVASIVE CV LAB;  Service: Cardiovascular;  Laterality: N/A;   VIDEO ASSISTED THORACOSCOPY (VATS)/DECORTICATION Left 04/25/2023   Procedure: VIDEO ASSISTED THORACOSCOPY (VATS)/DECORTICATION;  Surgeon: Loreli Slot, MD;  Location: [facility] OR;  Service: Thoracic;  Laterality: Left;   VIDEO BRONCHOSCOPY WITH ENDOBRONCHIAL ULTRASOUND N/A 09/25/2019   Procedure: VIDEO BRONCHOSCOPY WITH ENDOBRONCHIAL ULTRASOUND;  Surgeon: Salena Saner, MD;  Location: [facility] ORS;  Service: Pulmonary;  Laterality: N/A;    Social History   Socioeconomic History   Marital status: Single    Spouse name: Not on file   Number of children: Not on file   Years of education: Not on file   Highest education level: Not on file  Occupational History   Not on file  Tobacco Use   Smoking status: Former    Current packs/day: 0.00    Average packs/day: 1 pack/day for 20.0 years (20.0 ttl pk-yrs)    Types: Cigarettes    Start date: 35    Quit date: 23    Years since quitting: 31.2   Smokeless tobacco: Never  Vaping Use   Vaping status: Never Used  Substance and Sexual Activity   Alcohol use: Not Currently    Comment: not drank any beer in 2 onths   Drug use: Never   Sexual activity: Not Currently  Other Topics Concern   Not on file  Social History Narrative   Not on file   Social  Drivers of Health   Financial Resource Strain: Not on file  Food Insecurity: No Food Insecurity (04/23/2023)   Hunger Vital Sign    Worried About Running Out of Food in the Last Year: Never true    Ran Out of Food in the Last Year: Never true  Transportation Needs: No Transportation Needs (04/23/2023)   PRAPARE - Administrator, Civil Service (Medical): No    Lack of Transportation (Non-Medical): No  Physical Activity: Not on file  Stress: Not on file  Social Connections: Socially Isolated (04/23/2023)   Social Connection and Isolation Panel [NHANES]    Frequency of Communication with Friends and Family: More than three times a week    Frequency of Social Gatherings with Friends and Family: Twice a week    Attends Religious Services: Never    Database administrator or Organizations: No    Attends Banker Meetings: Never    Marital Status: Never married  Intimate Partner Violence: Not At Risk (04/23/2023)   Humiliation, Afraid, Rape, and Kick questionnaire    Fear of Current or Ex-Partner: No    Emotionally Abused: No    Physically Abused: No  Sexually Abused: No    Family History  Problem Relation Age of Onset   Cancer Brother      Current Outpatient Medications:    ANORO ELLIPTA 62.5-25 MCG/ACT AEPB, INHALE 1 PUFF INTO THE LUNGS DAILY, Disp: 60 each, Rfl: 2   lidocaine-prilocaine (EMLA) cream, Apply to affected area once, Disp: 30 g, Rfl: 3   ondansetron (ZOFRAN) 8 MG tablet, Take 1 tablet (8 mg total) by mouth every 8 (eight) hours as needed for nausea or vomiting., Disp: 30 tablet, Rfl: 1   prochlorperazine (COMPAZINE) 10 MG tablet, Take 1 tablet (10 mg total) by mouth every 6 (six) hours as needed for nausea or vomiting., Disp: 30 tablet, Rfl: 1   umeclidinium-vilanterol (ANORO ELLIPTA) 62.5-25 MCG/ACT AEPB, Inhale 1 puff into the lungs daily., Disp: 1 each, Rfl: 3  Physical exam:  Vitals:   05/17/23 1101  BP: 133/84  Pulse: (!) 106  Resp: 18   Temp: (!) 96 F (35.6 C)  TempSrc: Tympanic  SpO2: 100%  Weight: 156 lb 3.2 oz (70.9 kg)  Height: 5\' 10"  (1.778 m)   Physical Exam Cardiovascular:     Rate and Rhythm: Normal rate and regular rhythm.     Heart sounds: Normal heart sounds.  Pulmonary:     Effort: Pulmonary effort is normal.     Comments: Breath sounds decreased over left lung base.  Scar of recent VATS procedure healing well Skin:    General: Skin is warm and dry.  Neurological:     Mental Status: He is alert and oriented to person, place, and time.      I have personally reviewed labs listed below:    Latest Ref Rng & Units 05/02/2023    2:12 AM  CMP  Glucose 70 - 99 mg/dL 161   BUN 8 - 23 mg/dL 8   Creatinine 0.96 - 0.45 mg/dL 4.09   Sodium 811 - 914 mmol/L 140   Potassium 3.5 - 5.1 mmol/L 3.8   Chloride 98 - 111 mmol/L 104   CO2 22 - 32 mmol/L 27   Calcium 8.9 - 10.3 mg/dL 8.4       Latest Ref Rng & Units 05/02/2023    2:12 AM  CBC  WBC 4.0 - 10.5 K/uL 6.6   Hemoglobin 13.0 - 17.0 g/dL 9.4   Hematocrit 78.2 - 52.0 % 31.2   Platelets 150 - 400 K/uL 402    I have personally reviewed Radiology images listed below: No images are attached to the encounter.  DG Chest 2 View Result Date: 05/16/2023 CLINICAL DATA:  Pleural effusion, vats EXAM: CHEST - 2 VIEW COMPARISON:  Multiple, most recent May 02, 2023; March 23, 2023 FINDINGS: No cardiomegaly. Significantly improved aeration of the lungs. Small airspace opacity along the lateral right lung base, possibly scarring. Trace right pleural effusion. Small left pleural effusion. Similar lucency along the left mid lung zone, possibly loculated pleural gas. Patchy airspace opacities again noted in the left lung base. Interval removal of the left-sided thoracostomy tube. Similarly positioned right chest port, terminating at the cavoatrial junction. Multilevel degenerative disc disease of the spine. IMPRESSION: 1. Improved aeration. Patchy airspace opacities  again noted in the left lung base, possibly atelectasis or scarring. Similar lucency along the lateral left mid lung, likely the loculated pleural gas noted on the prior study. 2. Trace right and small left pleural effusions. Electronically Signed   By: Wallie Char M.D.   On: 05/16/2023 08:43   ECHOCARDIOGRAM COMPLETE Result  Date: 05/08/2023    ECHOCARDIOGRAM REPORT   Patient Name:   MYQUAN SCHAUMBURG Date of Exam: 05/08/2023 Medical Rec #:  [id number]    Height:       70.0 in Accession #:    [id number]   Weight:       178.1 lb Date of Birth:  [birth year]     BSA:          1.987 m Patient Age:    72 years     BP:           134/98 mmHg Patient Gender: M            HR:           100 bpm. Exam Location:  [facility] Procedure: 2D Echo, Cardiac Doppler, Strain Analysis and Color Doppler (Both            Spectral and Color Flow Doppler were utilized during procedure). Indications:     Chemo Z09  History:         Patient has prior history of Echocardiogram examinations, most                  recent 04/24/2023. Signs/Symptoms:Dyspnea; Risk                  Factors:Dyslipidemia and Hypertension.  Sonographer:     Cristela Blue Referring Phys:  [id number] [facility] Raissa Dam Diagnosing Phys: Windell Norfolk  Sonographer Comments: Global longitudinal strain was attempted. IMPRESSIONS  1. Left ventricular ejection fraction, by estimation, is 55 to 60%. The left ventricle has normal function. The left ventricle has no regional wall motion abnormalities. There is mild left ventricular hypertrophy. Left ventricular diastolic parameters are consistent with Grade I diastolic dysfunction (impaired relaxation).  2. Right ventricular systolic function is normal. The right ventricular size is normal.  3. The mitral valve is normal in structure. Trivial mitral valve regurgitation.  4. The aortic valve is tricuspid. There is mild thickening of the aortic valve. Aortic valve regurgitation is mild to moderate.  5. Aortic dilatation noted. There is mild  dilatation of the aortic root, measuring 41 mm. There is moderate dilatation of the ascending aorta, measuring 45 mm. FINDINGS  Left Ventricle: Left ventricular ejection fraction, by estimation, is 55 to 60%. The left ventricle has normal function. The left ventricle has no regional wall motion abnormalities. The left ventricular internal cavity size was normal in size. There is  mild left ventricular hypertrophy. Left ventricular diastolic parameters are consistent with Grade I diastolic dysfunction (impaired relaxation). Right Ventricle: The right ventricular size is normal. No increase in right ventricular wall thickness. Right ventricular systolic function is normal. Left Atrium: Left atrial size was normal in size. Right Atrium: Right atrial size was normal in size. Pericardium: There is no evidence of pericardial effusion. Mitral Valve: The mitral valve is normal in structure. Trivial mitral valve regurgitation. Tricuspid Valve: The tricuspid valve is normal in structure. Tricuspid valve regurgitation is mild. Aortic Valve: The aortic valve is tricuspid. There is mild thickening of the aortic valve. Aortic valve regurgitation is mild to moderate. Aortic valve mean gradient measures 3.0 mmHg. Aortic valve peak gradient measures 5.0 mmHg. Aortic valve area, by VTI measures 3.44 cm. Pulmonic Valve: The pulmonic valve was not well visualized. Pulmonic valve regurgitation is not visualized. Aorta: Aortic dilatation noted. There is mild dilatation of the aortic root, measuring 41 mm. There is moderate dilatation of the ascending aorta, measuring 45 mm. IAS/Shunts: The interatrial septum was  not well visualized.  LEFT VENTRICLE PLAX 2D LVIDd:         3.70 cm   Diastology LVIDs:         2.50 cm   LV e' medial:    7.51 cm/s LV PW:         1.20 cm   LV E/e' medial:  15.2 LV IVS:        1.40 cm   LV e' lateral:   10.10 cm/s LVOT diam:     2.10 cm   LV E/e' lateral: 11.3 LV SV:         62 LV SV Index:   31 LVOT Area:      3.46 cm  RIGHT VENTRICLE RV Basal diam:  3.00 cm RV Mid diam:    2.20 cm RV S prime:     11.00 cm/s TAPSE (M-mode): 2.4 cm LEFT ATRIUM             Index        RIGHT ATRIUM           Index LA diam:        3.00 cm 1.51 cm/m   RA Area:     19.80 cm LA Vol (A2C):   39.7 ml 19.98 ml/m  RA Volume:   58.80 ml  29.59 ml/m LA Vol (A4C):   24.3 ml 12.23 ml/m LA Biplane Vol: 30.9 ml 15.55 ml/m  AORTIC VALVE AV Area (Vmax):    3.49 cm AV Area (Vmean):   3.56 cm AV Area (VTI):     3.44 cm AV Vmax:           112.00 cm/s AV Vmean:          72.200 cm/s AV VTI:            0.181 m AV Peak Grad:      5.0 mmHg AV Mean Grad:      3.0 mmHg LVOT Vmax:         113.00 cm/s LVOT Vmean:        74.200 cm/s LVOT VTI:          0.180 m LVOT/AV VTI ratio: 0.99  AORTA Ao Root diam: 4.25 cm MITRAL VALVE                TRICUSPID VALVE MV Area (PHT): 4.08 cm     TR Peak grad:   13.1 mmHg MV Decel Time: 186 msec     TR Vmax:        181.00 cm/s MV E velocity: 114.00 cm/s MV A velocity: 88.60 cm/s   SHUNTS MV E/A ratio:  1.29         Systemic VTI:  0.18 m                             Systemic Diam: 2.10 cm Windell Norfolk Electronically signed by Windell Norfolk Signature Date/Time: 05/08/2023/5:14:47 PM    Final    DG Chest 1 View Result Date: 05/02/2023 CLINICAL DATA:  Pneumothorax. EXAM: CHEST  1 VIEW COMPARISON:  Chest radiograph dated 05/01/2023. FINDINGS: Interval removal of left-sided chest tube. Similar appearance of a loculated pleural air in the lateral left mid lung field. No change in the left mid to lower lung field opacity. Similar positioning of right Port-A-Cath. Stable cardiomediastinal silhouette. No acute osseous pathology. IMPRESSION: Interval removal of left-sided chest tube. No change in the loculated pleural air in the lateral  left mid lung field. Electronically Signed   By: Elgie Collard M.D.   On: 05/02/2023 09:42   DG Chest 1V REPEAT Same Day Result Date: 05/01/2023 CLINICAL DATA:  Empyema of the left pleural  space. EXAM: CHEST - 1 VIEW SAME DAY COMPARISON:  X-ray 05/01/2023 earlier. FINDINGS: Stable right IJ chest port. Stable left chest tube. Stable patchy left mid lower lung opacities with presumed loculated lateral pneumothorax. Persistent right-sided pleural effusion. Stable cardiopericardial silhouette. Overlapping cardiac leads. Curvature of the spine. IMPRESSION: No significant interval change from previous. Electronically Signed   By: Karen Kays M.D.   On: 05/01/2023 15:51   DG CHEST PORT 1 VIEW Result Date: 05/01/2023 CLINICAL DATA:  Pneumothorax EXAM: PORTABLE CHEST 1 VIEW COMPARISON:  X-ray 04/30/2023 and older. FINDINGS: Right IJ chest port with tip along the SVC right atrial junction. Left-sided chest tube in place. Stable lateral left pneumothorax. Patchy left mid lower lung parenchymal opacities also similar. Tiny right effusion with some adjacent opacity. No edema. No right-sided effusion. Stable cardiopericardial silhouette. Overlapping cardiac leads. IMPRESSION: No significant oval change when adjusted for technique. Electronically Signed   By: Karen Kays M.D.   On: 05/01/2023 11:56   DG CHEST PORT 1 VIEW Result Date: 04/30/2023 CLINICAL DATA:  Pneumothorax EXAM: PORTABLE CHEST 1 VIEW COMPARISON:  Yesterday FINDINGS: Single remaining left chest tube with lateral pneumothorax that is small. Small right pleural effusion tracking along the lateral chest wall. Airspace disease in the left mid to lower lung. Stable heart size and mediastinal contours. There is a pericardial effusion by most recent CT. Right port with tip at the upper right atrium. IMPRESSION: Small lateral left pneumothorax. Remaining chest tube in stable position and more medial than the visible gas. Stable aeration. Electronically Signed   By: Tiburcio Pea M.D.   On: 04/30/2023 09:40   DG CHEST PORT 1 VIEW Result Date: 04/29/2023 CLINICAL DATA:  Pleural effusion on the left. History of recurrent effusion. EXAM: PORTABLE  CHEST 1 VIEW COMPARISON:  04/28/2023. FINDINGS: Power port is unchanged with its tip in the proximal right atrium. Two left chest tubes remain in place. No pleural air scratch that there may be a small amount of pleural air at the inferior pleural space. No evidence of reaccumulation of pleural fluid. Persistent volume loss/infiltrate in the left lower lobe. Mild volume loss in the right lower lobe. IMPRESSION: Two left chest tubes remain in place. No evidence of reaccumulation of pleural fluid. Probable small amount of persistent pleural air at the left base laterally. Persistent volume loss/infiltrate in the left lower lobe. Mild volume loss in the right lower lobe. Electronically Signed   By: Paulina Fusi M.D.   On: 04/29/2023 13:41   DG CHEST PORT 1 VIEW Result Date: 04/28/2023 CLINICAL DATA:  Right-sided thoracentesis EXAM: PORTABLE CHEST 1 VIEW COMPARISON:  04/28/2023 FINDINGS: Single frontal view of the chest demonstrates stable right chest wall port. There are 2 left-sided chest tubes unchanged in position. There may be a small loculated pneumothorax at the left lung base unchanged since prior study. No right-sided pneumothorax. No significant pleural effusion. Continued consolidation in the left perihilar and left lower lung zones compatible with atelectasis or pneumonia. No acute bony abnormalities. IMPRESSION: 1. Stable left-sided chest tubes, with likely small loculated left basilar pneumothorax unchanged since prior exam. 2. Continued consolidation within the left perihilar region and left lower lobe, which may reflect atelectasis or pneumonia. 3. No evidence of complication after reported interval right thoracentesis. No  right-sided pneumothorax. Electronically Signed   By: Sharlet Salina M.D.   On: 04/28/2023 15:50   DG Chest Port 1 View Result Date: 04/28/2023 CLINICAL DATA:  Left-sided empyema. EXAM: PORTABLE CHEST 1 VIEW COMPARISON:  April 27, 2023. FINDINGS: Stable cardiomegaly. Right  internal jugular Port-A-Cath is unchanged. Two left-sided chest tubes are noted with probable pneumothorax noted laterally which is slightly increased compared to prior exam. Stable bilateral lung opacities are noted. Pericardial drain is noted. IMPRESSION: Two left-sided chest tubes are again noted with small left lateral pneumothorax which may be slightly increased compared to prior exam. Stable bilateral lung opacities. Electronically Signed   By: Lupita Raider M.D.   On: 04/28/2023 11:48   DG Chest Port 1 View Result Date: 04/27/2023 CLINICAL DATA:  Pleural effusion. EXAM: PORTABLE CHEST 1 VIEW COMPARISON:  April 26, 2023. FINDINGS: Stable cardiomegaly. Right internal jugular Port-A-Cath is unchanged. Small right pleural effusion is noted which is improved compared to prior exam with associated atelectasis. Two left-sided chest tubes are noted as well pericardial drain. Small pneumothorax is noted laterally. Left basilar atelectasis or pneumonia is noted. Small amount of subcutaneous emphysema is seen on the left. IMPRESSION: Stable cardiomegaly. Small right pleural effusion which is improved compared to prior exam. 2 left-sided chest tubes are again noted with associated pericardial drain. Small pneumothorax is again noted laterally. Electronically Signed   By: Lupita Raider M.D.   On: 04/27/2023 09:41   DG Chest Port 1 View Result Date: 04/26/2023 CLINICAL DATA:  Status post left lung surgery EXAM: PORTABLE CHEST 1 VIEW COMPARISON:  04/25/2023 FINDINGS: Cardiac shadow remains enlarged. Right chest wall port is again seen. Small right-sided pleural effusion is noted and stable. Two chest tubes are noted on the left with a pericardial drain as well. The previously seen left-sided pneumothorax laterally and inferiorly has decreased although a mild lateral component remains. Some atelectatic changes in the left base are noted. Subcutaneous emphysema is seen. IMPRESSION: Reduction in left-sided  pneumothorax with only a small lateral component remaining. Electronically Signed   By: Alcide Clever M.D.   On: 04/26/2023 10:50   DG Chest Port 1 View Result Date: 04/25/2023 CLINICAL DATA:  [id number] S/P lung surgery, follow-up exam [id number], chest tube placement EXAM: PORTABLE CHEST 1 VIEW COMPARISON:  04/25/2023 FINDINGS: Interval evacuation of the left pleural effusion with dual left chest tubes and probable pericardial drain in place. Incomplete re-expansion the left lung with pneumothorax ex vacuo noted loculated inferolaterally. Together, the findings suggest trapped lung. Moderate right pleural effusion appears unchanged. Associated right basilar compressive atelectasis again noted. No pneumothorax on the right. Interval placement of a pigtail chest tube in the region the right lung base or subdiaphragmatic region with exact localization difficult due to opacity. Cardiac size within normal limits. Right internal jugular chest port is unchanged with its tip at the superior cavoatrial junction. IMPRESSION: 1. Interval evacuation of the left pleural effusion with dual left chest tubes and probable pericardial drain in place. Incomplete re-expansion the left lung with pneumothorax ex vacuo noted loculated inferolaterally. Together, the findings suggest trapped lung. 2. Moderate right pleural effusion with associated right basilar compressive atelectasis, unchanged. Interval placement of a pigtail chest tube in the region the right lung base or subdiaphragmatic region with exact localization difficult due to opacity. This may be better assessed with CT examination to more precisely localize chest tube location and assess the degree fluid loculation. Electronically Signed   By: Lyda Kalata.D.  On: 04/25/2023 23:47   DG CHEST PORT 1 VIEW Result Date: 04/25/2023 CLINICAL DATA:  Shortness of breath. EXAM: PORTABLE CHEST 1 VIEW COMPARISON:  Radiograph 04/23/2023.  CT 04/24/2023 FINDINGS: Right chest port  remains in place. Large left and moderate to large right pleural effusions, without significant interval change. Stable heart size and mediastinal contours. No pneumothorax. IMPRESSION: Large left and moderate to large right pleural effusions, without significant interval change. Electronically Signed   By: Narda Rutherford M.D.   On: 04/25/2023 10:47   CT CHEST WO CONTRAST Result Date: 04/24/2023 CLINICAL DATA:  Dyspnea, chronic, chest wall or pleura disease suspected. Non-small cell lung cancer. EXAM: CT CHEST WITHOUT CONTRAST TECHNIQUE: Multidetector CT imaging of the chest was performed following the standard protocol without IV contrast. RADIATION DOSE REDUCTION: This exam was performed according to the departmental dose-optimization program which includes automated exposure control, adjustment of the mA and/or kV according to patient size and/or use of iterative reconstruction technique. COMPARISON:  PET CT 12/12/2022, CT chest 03/23/2023 FINDINGS: Cardiovascular: Right chest wall Port-A-Cath with tip terminating at the superior caval junction. Small heart size. Slightly increased small to moderate pericardial effusion. Enlarged ascending thoracic aorta measuring up to 4.2 cm. Mild atherosclerotic plaque of the thoracic aorta. No coronary artery calcifications. Mediastinum/Nodes: No gross hilar adenopathy, noting limited sensitivity for the detection of hilar adenopathy on this noncontrast study. No enlarged mediastinal or axillary lymph nodes. Thyroid gland, trachea, and esophagus demonstrate no significant findings. Lungs/Pleura: Almost complete collapse of the left lower lobe. Passive atelectasis of the left upper lobe. Passive atelectasis of the right lower lobe. Stable subpleural right 6 x 4 mm pulmonary nodule (3:71). No pulmonary mass interval increase in size of small to moderate right pleural effusion. Grossly stable moderate to large left pleural effusion with query pleural thickening-limited  evaluation on this noncontrast study. No pneumothorax. Upper Abdomen: No acute abnormality. Musculoskeletal: No chest wall abnormality. No suspicious lytic or blastic osseous lesions. No acute displaced fracture. IMPRESSION: 1. Slightly increased small to moderate pericardial effusion. Small heart size with limited evaluation on this noncontrast study. 2. 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 Grossly stable moderate to large left pleural effusion with query pleural thickening-limited evaluation on this noncontrast study. 4. Limited evaluation this noncontrast study. These results will be called to the ordering clinician or representative by the Radiologist Assistant, and communication documented in the PACS or [employer]. Electronically Signed   By: Tish Frederickson M.D.   On: 04/24/2023 21:38   ECHOCARDIOGRAM COMPLETE Result Date: 04/24/2023    ECHOCARDIOGRAM REPORT   Patient Name:   COLEMAN KALAS Date of Exam: 04/24/2023 Medical Rec #:  [id number]    Height:       70.0 in Accession #:    [id number]   Weight:       183.6 lb Date of Birth:  Aug 22, 1950     BSA:          2.013 m Patient Age:    72 years     BP:           134/98 mmHg Patient Gender: M            HR:           109 bpm. Exam Location:  Inpatient Procedure: 2D Echo, Color Doppler and Cardiac Doppler (Both Spectral and Color            Flow Doppler were utilized during procedure). Indications:    I31.3 Pericardial effusion (  noninflammatory)  History:        Patient has prior history of Echocardiogram examinations, most                 recent 11/11/2020. Risk Factors:Hypertension and Dyslipidemia.                 Left Lung Cancer.  Sonographer:    Irving Burton Senior RDCS Sonographer#2:  Rosaland Lao Referring Phys: [id number] Kateri Mc LATIF SHEIKH IMPRESSIONS  1. Left ventricular ejection fraction, by estimation, is 55 to 60%. Left ventricular ejection fraction by 2D MOD biplane is 43.4 %. The left ventricle has normal function. The left  ventricle demonstrates global hypokinesis. There is mild asymmetric left  ventricular hypertrophy of the septal segment. Left ventricular diastolic parameters are indeterminate.  2. Right ventricular systolic function is normal. The right ventricular size is normal. Tricuspid regurgitation signal is inadequate for assessing PA pressure.  3. Significant respiratory variation in both mitral and tricuspid inflows, evidence of RV diastolic collapse, IVC is slightly collapsible but dilated consistent with early tamponade physiology. Large pericardial effusion. The pericardial effusion is circumferential. Findings are consistent with cardiac tamponade.  4. The mitral valve is normal in structure. Trivial mitral valve regurgitation.  5. The aortic valve is tricuspid. Aortic valve regurgitation is mild to moderate.  6. Aortic dilatation noted. There is mild dilatation of the aortic root, measuring 42 mm.  7. The inferior vena cava is dilated in size with <50% respiratory variability, suggesting right atrial pressure of 15 mmHg. Conclusion(s)/Recommendation(s): Large circumfrential pericardial effusion with evidenece of tamponade physiology. Ordering team will be notified. FINDINGS  Left Ventricle: Left ventricular ejection fraction, by estimation, is 55 to 60%. Left ventricular ejection fraction by 2D MOD biplane is 43.4 %. The left ventricle has normal function. The left ventricle demonstrates global hypokinesis. The left ventricular internal cavity size was normal in size. There is mild asymmetric left ventricular hypertrophy of the septal segment. Left ventricular diastolic parameters are indeterminate. Right Ventricle: The right ventricular size is normal. No increase in right ventricular wall thickness. Right ventricular systolic function is normal. Tricuspid regurgitation signal is inadequate for assessing PA pressure. Left Atrium: Left atrial size was normal in size. Right Atrium: Right atrial size was normal in  size. Pericardium: Significant respiratory variation in both mitral and tricuspid inflows, evidence of RV diastolic collapse, IVC is slightly collapsible but dilated consistent with early tamponade physiology. A large pericardial effusion is present. The pericardial effusion is circumferential. There is evidence of cardiac tamponade. Thickening/calcification of pericardium present. Mitral Valve: The mitral valve is normal in structure. Trivial mitral valve regurgitation. Tricuspid Valve: The tricuspid valve is normal in structure. Tricuspid valve regurgitation is trivial. Aortic Valve: The aortic valve is tricuspid. Aortic valve regurgitation is mild to moderate. Pulmonic Valve: The pulmonic valve was normal in structure. Pulmonic valve regurgitation is trivial. Aorta: The aortic root and ascending aorta are structurally normal, with no evidence of dilitation and aortic dilatation noted. There is mild dilatation of the aortic root, measuring 42 mm. Venous: The inferior vena cava is dilated in size with less than 50% respiratory variability, suggesting right atrial pressure of 15 mmHg. The inferior vena cava and the hepatic vein show a systolic blunting flow pattern. IAS/Shunts: No atrial level shunt detected by color flow Doppler. Additional Comments: There is pleural effusion in the left lateral region.  LEFT VENTRICLE PLAX 2D  Biplane EF (MOD) LVIDd:         4.10 cm         LV Biplane EF:   Left LVIDs:         2.60 cm                          ventricular LV PW:         0.90 cm                          ejection LV IVS:        1.10 cm                          fraction by LVOT diam:     2.20 cm                          2D MOD LV SV:         42                               biplane is LV SV Index:   21                               43.4 %. LVOT Area:     3.80 cm  LV Volumes (MOD) LV vol d, MOD    75.3 ml A2C: LV vol d, MOD    56.9 ml A4C: LV vol s, MOD    40.3 ml A2C: LV vol s, MOD    32.7 ml  A4C: LV SV MOD A2C:   35.0 ml LV SV MOD A4C:   56.9 ml LV SV MOD BP:    28.6 ml RIGHT VENTRICLE RV S prime:     16.00 cm/s TAPSE (M-mode): 1.8 cm LEFT ATRIUM             Index        RIGHT ATRIUM           Index LA diam:        3.40 cm 1.69 cm/m   RA Area:     12.30 cm LA Vol (A2C):   61.9 ml 30.75 ml/m  RA Volume:   26.90 ml  13.36 ml/m LA Vol (A4C):   18.3 ml 9.09 ml/m LA Biplane Vol: 33.2 ml 16.49 ml/m  AORTIC VALVE LVOT Vmax:   74.40 cm/s LVOT Vmean:  55.033 cm/s LVOT VTI:    0.111 m  AORTA Ao Root diam: 4.20 cm Ao Asc diam:  3.70 cm  SHUNTS Systemic VTI:  0.11 m Systemic Diam: 2.20 cm Arvilla Meres MD Electronically signed by Arvilla Meres MD Signature Date/Time: 04/24/2023/2:53:34 PM    Final    DG Chest 2 View Result Date: 04/23/2023 CLINICAL DATA:  Shortness of breath for 1 month.  Chest pain. EXAM: CHEST - 2 VIEW COMPARISON:  February 06, 2023. FINDINGS: Stable cardiomediastinal silhouette. Right internal jugular Port-A-Cath is unchanged. Stable moderate size loculated left pleural effusion. Hypoinflation of the lungs is noted with minimal right basilar subsegmental atelectasis. Bony thorax is unremarkable. IMPRESSION: Stable moderate size loculated left pleural effusion. Hypoinflation of the lungs with minimal right basilar subsegmental atelectasis. Electronically Signed   By: Zenda Alpers.D.  On: 04/23/2023 12:34     Assessment and plan- Patient is a 73 y.o. 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  He had mediastinal recurrence in September 2022 and went on to receive [place] Alimta chemotherapy for 4 cycles followed by maintenance Alimta which she stopped in December 2023 due to worsening anemia and AKI.  He has had multiple scans since then which have not shown any  evidence of worsening lung mets mediastinal adenopathy or distant metastatic disease.  He was noted to have moderate to large left pleural effusions in multiple locations along with small to moderate pericardial effusion for which she underwent thoracentesis in the past and all of them have been negative for malignancy.  He was seen by CT surgery in March 2025 and underwent VATS procedure along with decortication.  Pleural and pericardial fluid was again negative for malignancy.  Pericardial biopsy showed no malignancy but there were foci of adenocarcinoma within the lymphovascular space noted in the visceral pleura.  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  Given that he had some amount of pleural effusion even back when he was on maintenance Alimta I would like to offer him a different regimen this time.  Patient had significant infusion reaction to Taxol and was therefore switched to Abraxane at the time of initial diagnosis in 2021.  I am therefore unable to use docetaxel for him due to cross-reactivity between Taxol and docetaxel.  Ramucirumab has only been studied along with docetaxel and a clinical trial but I will see if down the line we could consider using Abraxane along with ramucirumab.  Patient is presently frail-appearing and therefore choosing single agent gemcitabine chemotherapy 800 mg/m 2 weeks on and 1 week off until progression or toxicity.  I will consider getting blood [employer] testing down the line to see if there are any evolving mutations.  His original NGS testing back in 2021 did not show any evidence of actionable mutations.  Patient has also had disease progression on durvalumab and therefore I am not using immunotherapy again this time.  Discussed risks and benefits of chemotherapy including all but not limited to possible risk of nausea vomiting low blood counts risk of infections and hospitalizations.  Treatment will be given with a  palliative intent.  Patient understands and agrees to proceed as planned.  Will plan to start gemcitabine chemotherapy in 1 week's time   Visit Diagnosis 1. 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)   2. Goals of care, counseling/discussion      Dr. Owens Shark, MD, MPH [facility] [phone] 05/17/2023 1:16 PM

## 2023-05-18 ENCOUNTER — Encounter: Payer: Self-pay | Admitting: Oncology

## 2023-05-19 ENCOUNTER — Telehealth: Payer: Self-pay

## 2023-05-19 NOTE — Telephone Encounter (Signed)
 Mitchell Frye (Key: [id number]) PA submitted for cover my meds for Oxycodone, pending insurance approval.

## 2023-05-22 ENCOUNTER — Encounter: Payer: Self-pay | Admitting: Oncology

## 2023-05-22 NOTE — Telephone Encounter (Signed)
 PA approved on 05/19/23. [id number].

## 2023-05-24 ENCOUNTER — Encounter: Payer: Self-pay | Admitting: Oncology

## 2023-05-24 ENCOUNTER — Inpatient Hospital Stay

## 2023-05-24 ENCOUNTER — Inpatient Hospital Stay (HOSPITAL_BASED_OUTPATIENT_CLINIC_OR_DEPARTMENT_OTHER): Admitting: Oncology

## 2023-05-24 VITALS — BP 139/83 | HR 95 | Temp 97.3°F | Resp 16 | Wt 159.1 lb

## 2023-05-24 VITALS — BP 138/84 | HR 88

## 2023-05-24 DIAGNOSIS — C3432 Malignant neoplasm of lower lobe, left bronchus or lung: Secondary | ICD-10-CM

## 2023-05-24 DIAGNOSIS — Z5111 Encounter for antineoplastic chemotherapy: Secondary | ICD-10-CM

## 2023-05-24 DIAGNOSIS — E876 Hypokalemia: Secondary | ICD-10-CM

## 2023-05-24 LAB — CBC WITH DIFFERENTIAL (CANCER CENTER ONLY)
Abs Immature Granulocytes: 0.02 10*3/uL (ref 0.00–0.07)
Basophils Absolute: 0 10*3/uL (ref 0.0–0.1)
Basophils Relative: 0 %
Eosinophils Absolute: 0.1 10*3/uL (ref 0.0–0.5)
Eosinophils Relative: 1 %
HCT: 33.4 % — ABNORMAL LOW (ref 39.0–52.0)
Hemoglobin: 10 g/dL — ABNORMAL LOW (ref 13.0–17.0)
Immature Granulocytes: 0 %
Lymphocytes Relative: 17 %
Lymphs Abs: 1.2 10*3/uL (ref 0.7–4.0)
MCH: 22.6 pg — ABNORMAL LOW (ref 26.0–34.0)
MCHC: 29.9 g/dL — ABNORMAL LOW (ref 30.0–36.0)
MCV: 75.6 fL — ABNORMAL LOW (ref 80.0–100.0)
Monocytes Absolute: 0.6 10*3/uL (ref 0.1–1.0)
Monocytes Relative: 9 %
Neutro Abs: 5.1 10*3/uL (ref 1.7–7.7)
Neutrophils Relative %: 73 %
Platelet Count: 378 10*3/uL (ref 150–400)
RBC: 4.42 MIL/uL (ref 4.22–5.81)
RDW: 20.6 % — ABNORMAL HIGH (ref 11.5–15.5)
WBC Count: 7.1 10*3/uL (ref 4.0–10.5)
nRBC: 0 % (ref 0.0–0.2)

## 2023-05-24 LAB — CMP (CANCER CENTER ONLY)
ALT: 10 U/L (ref 0–44)
AST: 17 U/L (ref 15–41)
Albumin: 3 g/dL — ABNORMAL LOW (ref 3.5–5.0)
Alkaline Phosphatase: 65 U/L (ref 38–126)
Anion gap: 8 (ref 5–15)
BUN: 12 mg/dL (ref 8–23)
CO2: 24 mmol/L (ref 22–32)
Calcium: 8.5 mg/dL — ABNORMAL LOW (ref 8.9–10.3)
Chloride: 104 mmol/L (ref 98–111)
Creatinine: 1.04 mg/dL (ref 0.61–1.24)
GFR, Estimated: >60 mL/min (ref >60)
Glucose, Bld: 104 mg/dL — ABNORMAL HIGH (ref 70–99)
Potassium: 3.1 mmol/L — ABNORMAL LOW (ref 3.5–5.1)
Sodium: 136 mmol/L (ref 135–145)
Total Bilirubin: 0.6 mg/dL (ref 0.0–1.2)
Total Protein: 7.6 g/dL (ref 6.5–8.1)

## 2023-05-24 MED ORDER — SODIUM CHLORIDE 0.9 % IV SOLN
1000.0000 mg/m2 | Freq: Once | INTRAVENOUS | Status: AC
  Administered 2023-05-24: 1862 mg via INTRAVENOUS
  Filled 2023-05-24: qty 48.97

## 2023-05-24 MED ORDER — POTASSIUM CHLORIDE IN NACL 20-0.9 MEQ/L-% IV SOLN
Freq: Once | INTRAVENOUS | Status: AC
Start: 2023-05-24 — End: 2023-05-24

## 2023-05-24 MED ORDER — HEPARIN SOD (PORK) LOCK FLUSH 100 UNIT/ML IV SOLN
500.0000 [IU] | Freq: Once | INTRAVENOUS | Status: AC | PRN
Start: 2023-05-24 — End: 2023-05-24
  Administered 2023-05-24: 500 [IU]
  Filled 2023-05-24: qty 5

## 2023-05-24 MED ORDER — SODIUM CHLORIDE 0.9 % IV SOLN
INTRAVENOUS | Status: DC
Start: 2023-05-24 — End: 2023-05-24
  Filled 2023-05-24: qty 250

## 2023-05-24 MED ORDER — PROCHLORPERAZINE MALEATE 10 MG PO TABS
10.0000 mg | ORAL_TABLET | Freq: Once | ORAL | Status: AC
  Administered 2023-05-24: 10 mg via ORAL
  Filled 2023-05-24: qty 1

## 2023-05-24 MED ORDER — POTASSIUM CHLORIDE 20 MEQ/100ML IV SOLN: 20.0000 meq | Freq: Once | INTRAVENOUS | Status: DC

## 2023-05-24 NOTE — Patient Instructions (Signed)
 [facility] - A DEPT OF [facility]  Discharge Instructions: Thank you for choosing [facility] to provide your oncology and hematology care.  If you have a lab appointment with the Cancer Center, please go directly to the Cancer Center and check in at the registration area.  Wear comfortable clothing and clothing appropriate for easy access to any Portacath or PICC line.   We strive to give you quality time with your provider. You may need to reschedule your appointment if you arrive late (15 or more minutes).  Arriving late affects you and other patients whose appointments are after yours.  Also, if you miss three or more appointments without notifying the office, you may be dismissed from the clinic at the provider's discretion.      For prescription refill requests, have your pharmacy contact our office and allow 72 hours for refills to be completed.    Today you received the following chemotherapy and/or immunotherapy agents Gemzar      To help prevent nausea and vomiting after your treatment, we encourage you to take your nausea medication as directed.  BELOW ARE SYMPTOMS THAT SHOULD BE REPORTED IMMEDIATELY: *FEVER GREATER THAN 100.4 F (38 C) OR HIGHER *CHILLS OR SWEATING *NAUSEA AND VOMITING THAT IS NOT CONTROLLED WITH YOUR NAUSEA MEDICATION *UNUSUAL SHORTNESS OF BREATH *UNUSUAL BRUISING OR BLEEDING *URINARY PROBLEMS (pain or burning when urinating, or frequent urination) *BOWEL PROBLEMS (unusual diarrhea, constipation, pain near the anus) TENDERNESS IN MOUTH AND THROAT WITH OR WITHOUT PRESENCE OF ULCERS (sore throat, sores in mouth, or a toothache) UNUSUAL RASH, SWELLING OR PAIN  UNUSUAL VAGINAL DISCHARGE OR ITCHING   Items with * indicate a potential emergency and should be followed up as soon as possible or go to the Emergency Department if any problems should occur.  Please show the CHEMOTHERAPY ALERT CARD or IMMUNOTHERAPY ALERT  CARD at check-in to the Emergency Department and triage nurse.  Should you have questions after your visit or need to cancel or reschedule your appointment, please contact [facility] - A DEPT OF [facility]  [phone] and follow the prompts.  Office hours are 8:00 a.m. to 4:30 p.m. Monday - Friday. Please note that voicemails left after 4:00 p.m. may not be returned until the following business day.  We are closed weekends and major holidays. You have access to a nurse at all times for urgent questions. Please call the main number to the clinic [phone] and follow the prompts.  For any non-urgent questions, you may also contact your provider using MyChart. We now offer e-Visits for anyone 60 and older to request care online for non-urgent symptoms. For details visit [email or web address].   Also download the MyChart app! Go to the app store, search "MyChart", open the app, select [place], and log in with your MyChart username and password.

## 2023-05-26 LAB — FUNGUS CULTURE WITH STAIN

## 2023-05-26 LAB — FUNGUS CULTURE RESULT

## 2023-05-26 LAB — FUNGAL ORGANISM REFLEX

## 2023-05-27 ENCOUNTER — Encounter: Payer: Self-pay | Admitting: Oncology

## 2023-05-27 NOTE — Progress Notes (Signed)
 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  No evidence of distant metastatic disease.  Pathology was consistent with non-small cell lung cancer favor adenocarcinoma.  Cells were positive for TTF-1 and negative for p40.   He received 1 cycle of CarboTaxol and then had significant reaction to Taxol.  He was switched to Abraxane for 3 more cycles but due to shortage of Abraxane he was switched to carbo Alimta for 2 cycles ending in October 2021 concurrent with radiation.  Subsequently he was started on maintenance durvalumab which she continued until September 2022.     CT scans in October 2022 showed concern for hilar recurrence and patient was retreated with carbo Alimta followed by maintenance Alimta which she received until December 2023.  He has been off treatment since then due to stable disease but Alimta was also complicated by worsening anemia and AKI.   Skains while he was on Alimta showed mild to moderate left pleural effusion.  He has had multiple thoracentesis in the past which were all negative for malignancy. PET scan in October 2024  did not show any evidence of recurrent or progressive disease elsewhere but showed large left pleural effusion with complete left lower lobe collapse.  Also noted to have small to moderate pericardial effusion on his CT scans.    He was admitted to [facility] and underwentVATS procedure with decortication and drainage of pleural and pericardial effusion.  Cytology from thoracentesis as well as pericardial fluid was negative for malignancy.  Pericardial biopsy showed nonspecific chronic inflammation.  However his visceral pleural biopsy showed scant foci of non-small cell carcinoma only within lymphovascular space.   NGS testing back in 2021 showed no evidence of actionable mutations.  Tumor mutational burden 0.  MSI stable PD-L1 10%  He was noted to have moderate to large left pleural effusions in multiple locations along with small to moderate pericardial effusion for which she underwent thoracentesis in the past and all of them have been negative for malignancy. He was seen by CT surgery in March 2025 and underwent VATS procedure along with decortication. Pleural and pericardial fluid was again negative for malignancy. Pericardial biopsy showed no malignancy but there were foci of adenocarcinoma within the lymphovascular space noted in the visceral pleura.  He was started on third line palliative gemcitabine.  Docetaxel could not be attempted given his prior reaction to Taxol  Interval history-shortness of breath has improved after thoracentesis.  He has ongoing fatigue.  ECOG PS- 2 Pain scale- 3 Opioid associated constipation- no  Review of systems- Review of Systems  Constitutional:  Positive for malaise/fatigue. Negative for chills, fever and weight loss.  HENT:  Negative for congestion, ear discharge and nosebleeds.  Eyes:  Negative for blurred vision.  Respiratory:  Positive for shortness of breath. Negative for cough, hemoptysis, sputum production and wheezing.   Cardiovascular:  Negative  for chest pain, palpitations, orthopnea and claudication.  Gastrointestinal:  Negative for abdominal pain, blood in stool, constipation, diarrhea, heartburn, melena, nausea and vomiting.  Genitourinary:  Negative for dysuria, flank pain, frequency, hematuria and urgency.  Musculoskeletal:  Negative for back pain, joint pain and myalgias.  Skin:  Negative for rash.  Neurological:  Negative for dizziness, tingling, focal weakness, seizures, weakness and headaches.  Endo/Heme/Allergies:  Does not bruise/bleed easily.  Psychiatric/Behavioral:  Negative for depression and suicidal ideas. The patient does not have insomnia.       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 [facility])      Past Surgical History:  Procedure Laterality Date   CHEST TUBE INSERTION Right 04/25/2023   Procedure: RIGHT PLEURAL CHEST TUBE INSERTION;  Surgeon: Zelphia Higashi, MD;  Location: [facility] OR;  Service: Thoracic;  Laterality: Right;   ESOPHAGOGASTRODUODENOSCOPY (EGD) WITH PROPOFOL N/A 12/01/2020   Procedure: ESOPHAGOGASTRODUODENOSCOPY (EGD) WITH PROPOFOL;  Surgeon: Selena Daily, MD;  Location: [facility] ENDOSCOPY;  Service: Gastroenterology;  Laterality: N/A;   IR THORACENTESIS ASP PLEURAL SPACE W/IMG GUIDE  12/26/2022   PERICARDIAL FLUID DRAINAGE N/A 04/25/2023   Procedure: DRAINAGE, PERICARDIAL EFFUSION;  Surgeon: Zelphia Higashi, MD;  Location: [facility] OR;  Service: Thoracic;  Laterality: N/A;   PERICARDIAL WINDOW N/A 04/25/2023   Procedure: CREATION, PERICARDIAL WINDOW;  Surgeon: Zelphia Higashi, MD;  Location: [facility] OR;  Service: Thoracic;  Laterality: N/A;   PLEURAL EFFUSION DRAINAGE Bilateral 04/25/2023   Procedure: DRAINAGE, PLEURAL EFFUSION;  Surgeon: Zelphia Higashi, MD;   Location: [facility] OR;  Service: Thoracic;  Laterality: Bilateral;   PORTA CATH INSERTION N/A 10/22/2019   Procedure: PORTA CATH INSERTION;  Surgeon: Jackquelyn Mass, MD;  Location: [facility] INVASIVE CV LAB;  Service: Cardiovascular;  Laterality: N/A;   VIDEO ASSISTED THORACOSCOPY (VATS)/DECORTICATION Left 04/25/2023   Procedure: VIDEO ASSISTED THORACOSCOPY (VATS)/DECORTICATION;  Surgeon: Zelphia Higashi, MD;  Location: [facility] OR;  Service: Thoracic;  Laterality: Left;   VIDEO BRONCHOSCOPY WITH ENDOBRONCHIAL ULTRASOUND N/A 09/25/2019   Procedure: VIDEO BRONCHOSCOPY WITH ENDOBRONCHIAL ULTRASOUND;  Surgeon: Marc Senior, MD;  Location: [facility] ORS;  Service: Pulmonary;  Laterality: N/A;    Social History   Socioeconomic History   Marital status: Single    Spouse name: Not on file   Number of children: Not on file   Years of education: Not on file   Highest education level: Not on file  Occupational History   Not on file  Tobacco Use   Smoking status: Former    Current packs/day: 0.00    Average packs/day: 1 pack/day for 20.0 years (20.0 ttl pk-yrs)    Types: Cigarettes    Start date: 61    Quit date: 60    Years since quitting: 31.2   Smokeless tobacco: Never  Vaping Use   Vaping status: Never Used  Substance and Sexual Activity   Alcohol use: Not Currently    Comment: not drank any beer in 2 onths   Drug use: Never   Sexual activity: Not Currently  Other Topics Concern  Not on file  Social History Narrative   Not on file   Social Drivers of Health   Financial Resource Strain: Not on file  Food Insecurity: No Food Insecurity (04/23/2023)   Hunger Vital Sign    Worried About Running Out of Food in the Last Year: Never true    Ran Out of Food in the Last Year: Never true  Transportation Needs: No Transportation Needs (04/23/2023)   PRAPARE - Administrator, Civil Service (Medical): No    Lack of Transportation (Non-Medical): No  Physical Activity: Not on file   Stress: Not on file  Social Connections: Socially Isolated (04/23/2023)   Social Connection and Isolation Panel [NHANES]    Frequency of Communication with Friends and Family: More than three times a week    Frequency of Social Gatherings with Friends and Family: Twice a week    Attends Religious Services: Never    Database administrator or Organizations: No    Attends Banker Meetings: Never    Marital Status: Never married  Intimate Partner Violence: Not At Risk (04/23/2023)   Humiliation, Afraid, Rape, and Kick questionnaire    Fear of Current or Ex-Partner: No    Emotionally Abused: No    Physically Abused: No    Sexually Abused: No    Family History  Problem Relation Age of Onset   Cancer Brother      Current Outpatient Medications:    acetaminophen (TYLENOL) 500 MG tablet, Take 500 mg by mouth every 6 (six) hours as needed for mild pain (pain score 1-3) (takes as needed for generalized pain.  Denies pain at this time.)., Disp: , Rfl:    ANORO ELLIPTA 62.5-25 MCG/ACT AEPB, INHALE 1 PUFF INTO THE LUNGS DAILY, Disp: 60 each, Rfl: 2   lidocaine-prilocaine (EMLA) cream, Apply to affected area once, Disp: 30 g, Rfl: 3   ondansetron (ZOFRAN) 8 MG tablet, Take 1 tablet (8 mg total) by mouth every 8 (eight) hours as needed for nausea or vomiting., Disp: 30 tablet, Rfl: 1   prochlorperazine (COMPAZINE) 10 MG tablet, Take 1 tablet (10 mg total) by mouth every 6 (six) hours as needed for nausea or vomiting., Disp: 30 tablet, Rfl: 1   umeclidinium-vilanterol (ANORO ELLIPTA) 62.5-25 MCG/ACT AEPB, Inhale 1 puff into the lungs daily., Disp: 1 each, Rfl: 3  Physical exam:  Vitals:   05/24/23 1317  BP: 139/83  Pulse: 95  Resp: 16  Temp: (!) 97.3 F (36.3 C)  SpO2: 100%  Weight: 159 lb 1.6 oz (72.2 kg)   Physical Exam Cardiovascular:     Rate and Rhythm: Regular rhythm. Tachycardia present.     Heart sounds: Normal heart sounds.  Pulmonary:     Effort: Pulmonary effort is  normal.     Comments: Breath sounds decreased over left lung base Abdominal:     General: Bowel sounds are normal.     Palpations: Abdomen is soft.  Skin:    General: Skin is warm and dry.  Neurological:     Mental Status: He is alert and oriented to person, place, and time.      I have personally reviewed labs listed below:    Latest Ref Rng & Units 05/24/2023   12:41 PM  CMP  Glucose 70 - 99 mg/dL 657   BUN 8 - 23 mg/dL 12   Creatinine 8.46 - 1.24 mg/dL 9.62   Sodium 952 - 841 mmol/L 136   Potassium 3.5 - 5.1  mmol/L 3.1   Chloride 98 - 111 mmol/L 104   CO2 22 - 32 mmol/L 24   Calcium 8.9 - 10.3 mg/dL 8.5   Total Protein 6.5 - 8.1 g/dL 7.6   Total Bilirubin 0.0 - 1.2 mg/dL 0.6   Alkaline Phos 38 - 126 U/L 65   AST 15 - 41 U/L 17   ALT 0 - 44 U/L 10       Latest Ref Rng & Units 05/24/2023   12:41 PM  CBC  WBC 4.0 - 10.5 K/uL 7.1   Hemoglobin 13.0 - 17.0 g/dL 16.1   Hematocrit 09.6 - 52.0 % 33.4   Platelets 150 - 400 K/uL 378    I have personally reviewed Radiology images listed below: No images are attached to the encounter.  DG Chest 2 View Result Date: 05/16/2023 CLINICAL DATA:  Pleural effusion, vats EXAM: CHEST - 2 VIEW COMPARISON:  Multiple, most recent May 02, 2023; March 23, 2023 FINDINGS: No cardiomegaly. Significantly improved aeration of the lungs. Small airspace opacity along the lateral right lung base, possibly scarring. Trace right pleural effusion. Small left pleural effusion. Similar lucency along the left mid lung zone, possibly loculated pleural gas. Patchy airspace opacities again noted in the left lung base. Interval removal of the left-sided thoracostomy tube. Similarly positioned right chest port, terminating at the cavoatrial junction. Multilevel degenerative disc disease of the spine. IMPRESSION: 1. Improved aeration. Patchy airspace opacities again noted in the left lung base, possibly atelectasis or scarring. Similar lucency along the lateral left  mid lung, likely the loculated pleural gas noted on the prior study. 2. Trace right and small left pleural effusions. Electronically Signed   By: Rance Burrows M.D.   On: 05/16/2023 08:43   ECHOCARDIOGRAM COMPLETE Result Date: 05/08/2023    ECHOCARDIOGRAM REPORT   Patient Name:   KIE CALVIN Date of Exam: 05/08/2023 Medical Rec #:  [id number]    Height:       70.0 in Accession #:    [id number]   Weight:       178.1 lb Date of Birth:  1950-06-08     BSA:          1.987 m Patient Age:    72 years     BP:           134/98 mmHg Patient Gender: M            HR:           100 bpm. Exam Location:  [facility] Procedure: 2D Echo, Cardiac Doppler, Strain Analysis and Color Doppler (Both            Spectral and Color Flow Doppler were utilized during procedure). Indications:     Chemo Z09  History:         Patient has prior history of Echocardiogram examinations, most                  recent 04/24/2023. Signs/Symptoms:Dyspnea; Risk                  Factors:Dyslipidemia and Hypertension.  Sonographer:     Broadus Canes Referring Phys:  [id number] [facility] C Naylah Cork Diagnosing Phys: Joetta Mustache  Sonographer Comments: Global longitudinal strain was attempted. IMPRESSIONS  1. Left ventricular ejection fraction, by estimation, is 55 to 60%. The left ventricle has normal function. The left ventricle has no regional wall motion abnormalities. There is mild left ventricular hypertrophy. Left ventricular diastolic parameters are consistent with Grade I  diastolic dysfunction (impaired relaxation).  2. Right ventricular systolic function is normal. The right ventricular size is normal.  3. The mitral valve is normal in structure. Trivial mitral valve regurgitation.  4. The aortic valve is tricuspid. There is mild thickening of the aortic valve. Aortic valve regurgitation is mild to moderate.  5. Aortic dilatation noted. There is mild dilatation of the aortic root, measuring 41 mm. There is moderate dilatation of the ascending aorta, measuring 45 mm.  FINDINGS  Left Ventricle: Left ventricular ejection fraction, by estimation, is 55 to 60%. The left ventricle has normal function. The left ventricle has no regional wall motion abnormalities. The left ventricular internal cavity size was normal in size. There is  mild left ventricular hypertrophy. Left ventricular diastolic parameters are consistent with Grade I diastolic dysfunction (impaired relaxation). Right Ventricle: The right ventricular size is normal. No increase in right ventricular wall thickness. Right ventricular systolic function is normal. Left Atrium: Left atrial size was normal in size. Right Atrium: Right atrial size was normal in size. Pericardium: There is no evidence of pericardial effusion. Mitral Valve: The mitral valve is normal in structure. Trivial mitral valve regurgitation. Tricuspid Valve: The tricuspid valve is normal in structure. Tricuspid valve regurgitation is mild. Aortic Valve: The aortic valve is tricuspid. There is mild thickening of the aortic valve. Aortic valve regurgitation is mild to moderate. Aortic valve mean gradient measures 3.0 mmHg. Aortic valve peak gradient measures 5.0 mmHg. Aortic valve area, by VTI measures 3.44 cm. Pulmonic Valve: The pulmonic valve was not well visualized. Pulmonic valve regurgitation is not visualized. Aorta: Aortic dilatation noted. There is mild dilatation of the aortic root, measuring 41 mm. There is moderate dilatation of the ascending aorta, measuring 45 mm. IAS/Shunts: The interatrial septum was not well visualized.  LEFT VENTRICLE PLAX 2D LVIDd:         3.70 cm   Diastology LVIDs:         2.50 cm   LV e' medial:    7.51 cm/s LV PW:         1.20 cm   LV E/e' medial:  15.2 LV IVS:        1.40 cm   LV e' lateral:   10.10 cm/s LVOT diam:     2.10 cm   LV E/e' lateral: 11.3 LV SV:         62 LV SV Index:   31 LVOT Area:     3.46 cm  RIGHT VENTRICLE RV Basal diam:  3.00 cm RV Mid diam:    2.20 cm RV S prime:     11.00 cm/s TAPSE (M-mode):  2.4 cm LEFT ATRIUM             Index        RIGHT ATRIUM           Index LA diam:        3.00 cm 1.51 cm/m   RA Area:     19.80 cm LA Vol (A2C):   39.7 ml 19.98 ml/m  RA Volume:   58.80 ml  29.59 ml/m LA Vol (A4C):   24.3 ml 12.23 ml/m LA Biplane Vol: 30.9 ml 15.55 ml/m  AORTIC VALVE AV Area (Vmax):    3.49 cm AV Area (Vmean):   3.56 cm AV Area (VTI):     3.44 cm AV Vmax:           112.00 cm/s AV Vmean:  72.200 cm/s AV VTI:            0.181 m AV Peak Grad:      5.0 mmHg AV Mean Grad:      3.0 mmHg LVOT Vmax:         113.00 cm/s LVOT Vmean:        74.200 cm/s LVOT VTI:          0.180 m LVOT/AV VTI ratio: 0.99  AORTA Ao Root diam: 4.25 cm MITRAL VALVE                TRICUSPID VALVE MV Area (PHT): 4.08 cm     TR Peak grad:   13.1 mmHg MV Decel Time: 186 msec     TR Vmax:        181.00 cm/s MV E velocity: 114.00 cm/s MV A velocity: 88.60 cm/s   SHUNTS MV E/A ratio:  1.29         Systemic VTI:  0.18 m                             Systemic Diam: 2.10 cm Joetta Mustache Electronically signed by Joetta Mustache Signature Date/Time: 05/08/2023/5:14:47 PM    Final    DG Chest 1 View Result Date: 05/02/2023 CLINICAL DATA:  Pneumothorax. EXAM: CHEST  1 VIEW COMPARISON:  Chest radiograph dated 05/01/2023. FINDINGS: Interval removal of left-sided chest tube. Similar appearance of a loculated pleural air in the lateral left mid lung field. No change in the left mid to lower lung field opacity. Similar positioning of right Port-A-Cath. Stable cardiomediastinal silhouette. No acute osseous pathology. IMPRESSION: Interval removal of left-sided chest tube. No change in the loculated pleural air in the lateral left mid lung field. Electronically Signed   By: Angus Bark M.D.   On: 05/02/2023 09:42   DG Chest 1V REPEAT Same Day Result Date: 05/01/2023 CLINICAL DATA:  Empyema of the left pleural space. EXAM: CHEST - 1 VIEW SAME DAY COMPARISON:  X-ray 05/01/2023 earlier. FINDINGS: Stable right IJ chest port.  Stable left chest tube. Stable patchy left mid lower lung opacities with presumed loculated lateral pneumothorax. Persistent right-sided pleural effusion. Stable cardiopericardial silhouette. Overlapping cardiac leads. Curvature of the spine. IMPRESSION: No significant interval change from previous. Electronically Signed   By: Adrianna Horde M.D.   On: 05/01/2023 15:51   DG CHEST PORT 1 VIEW Result Date: 05/01/2023 CLINICAL DATA:  Pneumothorax EXAM: PORTABLE CHEST 1 VIEW COMPARISON:  X-ray 04/30/2023 and older. FINDINGS: Right IJ chest port with tip along the SVC right atrial junction. Left-sided chest tube in place. Stable lateral left pneumothorax. Patchy left mid lower lung parenchymal opacities also similar. Tiny right effusion with some adjacent opacity. No edema. No right-sided effusion. Stable cardiopericardial silhouette. Overlapping cardiac leads. IMPRESSION: No significant oval change when adjusted for technique. Electronically Signed   By: Adrianna Horde M.D.   On: 05/01/2023 11:56   DG CHEST PORT 1 VIEW Result Date: 04/30/2023 CLINICAL DATA:  Pneumothorax EXAM: PORTABLE CHEST 1 VIEW COMPARISON:  Yesterday FINDINGS: Single remaining left chest tube with lateral pneumothorax that is small. Small right pleural effusion tracking along the lateral chest wall. Airspace disease in the left mid to lower lung. Stable heart size and mediastinal contours. There is a pericardial effusion by most recent CT. Right port with tip at the upper right atrium. IMPRESSION: Small lateral left pneumothorax. Remaining chest tube in stable position and more medial than  the visible gas. Stable aeration. Electronically Signed   By: Ronnette Coke M.D.   On: 04/30/2023 09:40   DG CHEST PORT 1 VIEW Result Date: 04/29/2023 CLINICAL DATA:  Pleural effusion on the left. History of recurrent effusion. EXAM: PORTABLE CHEST 1 VIEW COMPARISON:  04/28/2023. FINDINGS: Power port is unchanged with its tip in the proximal right atrium.  Two left chest tubes remain in place. No pleural air scratch that there may be a small amount of pleural air at the inferior pleural space. No evidence of reaccumulation of pleural fluid. Persistent volume loss/infiltrate in the left lower lobe. Mild volume loss in the right lower lobe. IMPRESSION: Two left chest tubes remain in place. No evidence of reaccumulation of pleural fluid. Probable small amount of persistent pleural air at the left base laterally. Persistent volume loss/infiltrate in the left lower lobe. Mild volume loss in the right lower lobe. Electronically Signed   By: Bettylou Brunner M.D.   On: 04/29/2023 13:41   DG CHEST PORT 1 VIEW Result Date: 04/28/2023 CLINICAL DATA:  Right-sided thoracentesis EXAM: PORTABLE CHEST 1 VIEW COMPARISON:  04/28/2023 FINDINGS: Single frontal view of the chest demonstrates stable right chest wall port. There are 2 left-sided chest tubes unchanged in position. There may be a small loculated pneumothorax at the left lung base unchanged since prior study. No right-sided pneumothorax. No significant pleural effusion. Continued consolidation in the left perihilar and left lower lung zones compatible with atelectasis or pneumonia. No acute bony abnormalities. IMPRESSION: 1. Stable left-sided chest tubes, with likely small loculated left basilar pneumothorax unchanged since prior exam. 2. Continued consolidation within the left perihilar region and left lower lobe, which may reflect atelectasis or pneumonia. 3. No evidence of complication after reported interval right thoracentesis. No right-sided pneumothorax. Electronically Signed   By: Bobbye Burrow M.D.   On: 04/28/2023 15:50   DG Chest Port 1 View Result Date: 04/28/2023 CLINICAL DATA:  Left-sided empyema. EXAM: PORTABLE CHEST 1 VIEW COMPARISON:  April 27, 2023. FINDINGS: Stable cardiomegaly. Right internal jugular Port-A-Cath is unchanged. Two left-sided chest tubes are noted with probable pneumothorax noted  laterally which is slightly increased compared to prior exam. Stable bilateral lung opacities are noted. Pericardial drain is noted. IMPRESSION: Two left-sided chest tubes are again noted with small left lateral pneumothorax which may be slightly increased compared to prior exam. Stable bilateral lung opacities. Electronically Signed   By: Rosalene Colon M.D.   On: 04/28/2023 11:48     Assessment and plan- Patient is a 73 y.o. male with recurrent adenocarcinoma of the lung here for on treatment assessment prior to cycle 1 day 1 of gemcitabine chemotherapy  Counts okay to proceed with cycle 1 day 1 of gemcitabine chemotherapy today.  He will directly proceed with cycle 1 day 8 next week I will see him back in 3 weeks for cycle 2-day 1.  If he has worsening shortness of breath we will get a chest x-ray and consider repeat thoracentesis.   Visit Diagnosis 1. 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)   2. Encounter for antineoplastic chemotherapy      Dr. Seretha Dance, MD, MPH [facility] at [facility] [phone] 05/27/2023 5:56 PM

## 2023-05-31 ENCOUNTER — Other Ambulatory Visit: Payer: Self-pay | Admitting: Oncology

## 2023-05-31 ENCOUNTER — Inpatient Hospital Stay

## 2023-05-31 ENCOUNTER — Ambulatory Visit: Admitting: Oncology

## 2023-05-31 VITALS — BP 149/95 | HR 95 | Temp 98.6°F | Resp 18

## 2023-05-31 DIAGNOSIS — C3432 Malignant neoplasm of lower lobe, left bronchus or lung: Secondary | ICD-10-CM

## 2023-05-31 LAB — CBC WITH DIFFERENTIAL (CANCER CENTER ONLY)
Abs Immature Granulocytes: 0.02 10*3/uL (ref 0.00–0.07)
Basophils Absolute: 0 10*3/uL (ref 0.0–0.1)
Basophils Relative: 1 %
Eosinophils Absolute: 0 10*3/uL (ref 0.0–0.5)
Eosinophils Relative: 0 %
HCT: 31.3 % — ABNORMAL LOW (ref 39.0–52.0)
Hemoglobin: 9.3 g/dL — ABNORMAL LOW (ref 13.0–17.0)
Immature Granulocytes: 1 %
Lymphocytes Relative: 31 %
Lymphs Abs: 0.9 10*3/uL (ref 0.7–4.0)
MCH: 22.5 pg — ABNORMAL LOW (ref 26.0–34.0)
MCHC: 29.7 g/dL — ABNORMAL LOW (ref 30.0–36.0)
MCV: 75.8 fL — ABNORMAL LOW (ref 80.0–100.0)
Monocytes Absolute: 0.4 10*3/uL (ref 0.1–1.0)
Monocytes Relative: 15 %
Neutro Abs: 1.5 10*3/uL — ABNORMAL LOW (ref 1.7–7.7)
Neutrophils Relative %: 52 %
Platelet Count: 211 10*3/uL (ref 150–400)
RBC: 4.13 MIL/uL — ABNORMAL LOW (ref 4.22–5.81)
RDW: 20.3 % — ABNORMAL HIGH (ref 11.5–15.5)
WBC Count: 2.8 10*3/uL — ABNORMAL LOW (ref 4.0–10.5)
nRBC: 0.7 % — ABNORMAL HIGH (ref 0.0–0.2)

## 2023-05-31 LAB — CMP (CANCER CENTER ONLY)
ALT: 14 U/L (ref 0–44)
AST: 23 U/L (ref 15–41)
Albumin: 2.8 g/dL — ABNORMAL LOW (ref 3.5–5.0)
Alkaline Phosphatase: 63 U/L (ref 38–126)
Anion gap: 7 (ref 5–15)
BUN: 14 mg/dL (ref 8–23)
CO2: 22 mmol/L (ref 22–32)
Calcium: 8.6 mg/dL — ABNORMAL LOW (ref 8.9–10.3)
Chloride: 106 mmol/L (ref 98–111)
Creatinine: 0.76 mg/dL (ref 0.61–1.24)
GFR, Estimated: >60 mL/min (ref >60)
Glucose, Bld: 106 mg/dL — ABNORMAL HIGH (ref 70–99)
Potassium: 3.4 mmol/L — ABNORMAL LOW (ref 3.5–5.1)
Sodium: 135 mmol/L (ref 135–145)
Total Bilirubin: 0.6 mg/dL (ref 0.0–1.2)
Total Protein: 7.5 g/dL (ref 6.5–8.1)

## 2023-05-31 MED ORDER — HEPARIN SOD (PORK) LOCK FLUSH 100 UNIT/ML IV SOLN
500.0000 [IU] | Freq: Once | INTRAVENOUS | Status: AC | PRN
  Administered 2023-05-31: 500 [IU]
  Filled 2023-05-31: qty 5

## 2023-05-31 MED ORDER — PROCHLORPERAZINE MALEATE 10 MG PO TABS
10.0000 mg | ORAL_TABLET | Freq: Once | ORAL | Status: AC
  Administered 2023-05-31: 10 mg via ORAL
  Filled 2023-05-31: qty 1

## 2023-05-31 MED ORDER — SODIUM CHLORIDE 0.9 % IV SOLN
1000.0000 mg/m2 | Freq: Once | INTRAVENOUS | Status: AC
  Administered 2023-05-31: 1862 mg via INTRAVENOUS
  Filled 2023-05-31: qty 48.97

## 2023-05-31 MED ORDER — SODIUM CHLORIDE 0.9 % IV SOLN
INTRAVENOUS | Status: DC
  Filled 2023-05-31: qty 250

## 2023-05-31 MED ORDER — SODIUM CHLORIDE 0.9% FLUSH
10.0000 mL | INTRAVENOUS | Status: DC | PRN
  Administered 2023-05-31: 10 mL
  Filled 2023-05-31: qty 10

## 2023-05-31 NOTE — Patient Instructions (Signed)
 [facility] - A DEPT OF [facility]  Discharge Instructions: Thank you for choosing [facility] to provide your oncology and hematology care.  If you have a lab appointment with the Cancer Center, please go directly to the Cancer Center and check in at the registration area.  Wear comfortable clothing and clothing appropriate for easy access to any Portacath or PICC line.   We strive to give you quality time with your provider. You may need to reschedule your appointment if you arrive late (15 or more minutes).  Arriving late affects you and other patients whose appointments are after yours.  Also, if you miss three or more appointments without notifying the office, you may be dismissed from the clinic at the provider's discretion.      For prescription refill requests, have your pharmacy contact our office and allow 72 hours for refills to be completed.    Today you received the following chemotherapy and/or immunotherapy agents- gemzar      To help prevent nausea and vomiting after your treatment, we encourage you to take your nausea medication as directed.  BELOW ARE SYMPTOMS THAT SHOULD BE REPORTED IMMEDIATELY: *FEVER GREATER THAN 100.4 F (38 C) OR HIGHER *CHILLS OR SWEATING *NAUSEA AND VOMITING THAT IS NOT CONTROLLED WITH YOUR NAUSEA MEDICATION *UNUSUAL SHORTNESS OF BREATH *UNUSUAL BRUISING OR BLEEDING *URINARY PROBLEMS (pain or burning when urinating, or frequent urination) *BOWEL PROBLEMS (unusual diarrhea, constipation, pain near the anus) TENDERNESS IN MOUTH AND THROAT WITH OR WITHOUT PRESENCE OF ULCERS (sore throat, sores in mouth, or a toothache) UNUSUAL RASH, SWELLING OR PAIN  UNUSUAL VAGINAL DISCHARGE OR ITCHING   Items with * indicate a potential emergency and should be followed up as soon as possible or go to the Emergency Department if any problems should occur.  Please show the CHEMOTHERAPY ALERT CARD or IMMUNOTHERAPY  ALERT CARD at check-in to the Emergency Department and triage nurse.  Should you have questions after your visit or need to cancel or reschedule your appointment, please contact [facility] - A DEPT OF [facility]  [phone] and follow the prompts.  Office hours are 8:00 a.m. to 4:30 p.m. Monday - Friday. Please note that voicemails left after 4:00 p.m. may not be returned until the following business day.  We are closed weekends and major holidays. You have access to a nurse at all times for urgent questions. Please call the main number to the clinic [phone] and follow the prompts.  For any non-urgent questions, you may also contact your provider using MyChart. We now offer e-Visits for anyone 59 and older to request care online for non-urgent symptoms. For details visit [email or web address].   Also download the MyChart app! Go to the app store, search "MyChart", open the app, select [place], and log in with your MyChart username and password.

## 2023-06-08 LAB — ACID FAST CULTURE WITH REFLEXED SENSITIVITIES (MYCOBACTERIA): Acid Fast Culture: NEGATIVE

## 2023-06-14 ENCOUNTER — Inpatient Hospital Stay

## 2023-06-14 ENCOUNTER — Encounter: Payer: Self-pay | Admitting: Oncology

## 2023-06-14 ENCOUNTER — Inpatient Hospital Stay: Admitting: Oncology

## 2023-06-14 VITALS — BP 142/85 | HR 91

## 2023-06-14 VITALS — BP 122/74 | HR 100 | Temp 97.4°F | Resp 16 | Wt 155.0 lb

## 2023-06-14 DIAGNOSIS — C3432 Malignant neoplasm of lower lobe, left bronchus or lung: Secondary | ICD-10-CM

## 2023-06-14 DIAGNOSIS — D6481 Anemia due to antineoplastic chemotherapy: Secondary | ICD-10-CM

## 2023-06-14 DIAGNOSIS — E876 Hypokalemia: Secondary | ICD-10-CM | POA: Diagnosis not present

## 2023-06-14 DIAGNOSIS — Z5111 Encounter for antineoplastic chemotherapy: Secondary | ICD-10-CM

## 2023-06-14 DIAGNOSIS — D509 Iron deficiency anemia, unspecified: Secondary | ICD-10-CM

## 2023-06-14 DIAGNOSIS — T451X5A Adverse effect of antineoplastic and immunosuppressive drugs, initial encounter: Secondary | ICD-10-CM

## 2023-06-14 LAB — CMP (CANCER CENTER ONLY)
ALT: 9 U/L (ref 0–44)
AST: 15 U/L (ref 15–41)
Albumin: 2.8 g/dL — ABNORMAL LOW (ref 3.5–5.0)
Alkaline Phosphatase: 63 U/L (ref 38–126)
Anion gap: 8 (ref 5–15)
BUN: 13 mg/dL (ref 8–23)
CO2: 23 mmol/L (ref 22–32)
Calcium: 8.3 mg/dL — ABNORMAL LOW (ref 8.9–10.3)
Chloride: 107 mmol/L (ref 98–111)
Creatinine: 1.09 mg/dL (ref 0.61–1.24)
GFR, Estimated: >60 mL/min (ref >60)
Glucose, Bld: 136 mg/dL — ABNORMAL HIGH (ref 70–99)
Potassium: 3.1 mmol/L — ABNORMAL LOW (ref 3.5–5.1)
Sodium: 138 mmol/L (ref 135–145)
Total Bilirubin: 0.4 mg/dL (ref 0.0–1.2)
Total Protein: 7.1 g/dL (ref 6.5–8.1)

## 2023-06-14 LAB — CBC WITH DIFFERENTIAL (CANCER CENTER ONLY)
Abs Immature Granulocytes: 0.03 10*3/uL (ref 0.00–0.07)
Basophils Absolute: 0 10*3/uL (ref 0.0–0.1)
Basophils Relative: 0 %
Eosinophils Absolute: 0 10*3/uL (ref 0.0–0.5)
Eosinophils Relative: 0 %
HCT: 31.5 % — ABNORMAL LOW (ref 39.0–52.0)
Hemoglobin: 9.4 g/dL — ABNORMAL LOW (ref 13.0–17.0)
Immature Granulocytes: 1 %
Lymphocytes Relative: 14 %
Lymphs Abs: 0.9 10*3/uL (ref 0.7–4.0)
MCH: 22.7 pg — ABNORMAL LOW (ref 26.0–34.0)
MCHC: 29.8 g/dL — ABNORMAL LOW (ref 30.0–36.0)
MCV: 75.9 fL — ABNORMAL LOW (ref 80.0–100.0)
Monocytes Absolute: 0.6 10*3/uL (ref 0.1–1.0)
Monocytes Relative: 10 %
Neutro Abs: 4.5 10*3/uL (ref 1.7–7.7)
Neutrophils Relative %: 75 %
Platelet Count: 693 10*3/uL — ABNORMAL HIGH (ref 150–400)
RBC: 4.15 MIL/uL — ABNORMAL LOW (ref 4.22–5.81)
RDW: 22.1 % — ABNORMAL HIGH (ref 11.5–15.5)
WBC Count: 6 10*3/uL (ref 4.0–10.5)
nRBC: 0 % (ref 0.0–0.2)

## 2023-06-14 MED ORDER — GEMCITABINE HCL CHEMO INJECTION 1 GM/26.3ML
800.0000 mg/m2 | Freq: Once | INTRAVENOUS | Status: AC
  Administered 2023-06-14: 1482 mg via INTRAVENOUS
  Filled 2023-06-14: qty 38.98

## 2023-06-14 MED ORDER — SODIUM CHLORIDE 0.9 % IV SOLN
Freq: Once | INTRAVENOUS | Status: AC
Start: 2023-06-14 — End: 2023-06-14
  Filled 2023-06-14: qty 250

## 2023-06-14 MED ORDER — HEPARIN SOD (PORK) LOCK FLUSH 100 UNIT/ML IV SOLN
500.0000 [IU] | Freq: Once | INTRAVENOUS | Status: AC | PRN
  Administered 2023-06-14: 500 [IU]
  Filled 2023-06-14: qty 5

## 2023-06-14 MED ORDER — PROCHLORPERAZINE MALEATE 10 MG PO TABS
10.0000 mg | ORAL_TABLET | Freq: Once | ORAL | Status: AC
  Administered 2023-06-14: 10 mg via ORAL
  Filled 2023-06-14: qty 1

## 2023-06-14 MED ORDER — POTASSIUM CHLORIDE 10 MEQ/100ML IV SOLN
10.0000 meq | Freq: Once | INTRAVENOUS | Status: AC
  Administered 2023-06-14: 10 meq via INTRAVENOUS
  Filled 2023-06-14: qty 100

## 2023-06-14 MED ORDER — SODIUM CHLORIDE 0.9 % IV SOLN
INTRAVENOUS | Status: DC
  Filled 2023-06-14: qty 250

## 2023-06-14 NOTE — Patient Instructions (Signed)

## 2023-06-14 NOTE — Progress Notes (Signed)
 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  No evidence of distant metastatic disease.  Pathology was consistent with non-small cell lung cancer favor adenocarcinoma.  Cells were positive for TTF-1 and negative for p40.   He received 1 cycle of CarboTaxol and then had significant reaction to Taxol .  He was switched to Abraxane  for 3 more cycles but due to shortage of Abraxane  he was switched to carbo Alimta  for 2 cycles ending in October 2021 concurrent with radiation.  Subsequently he was started on maintenance durvalumab  which she continued until September 2022.     CT scans in October 2022 showed concern for hilar recurrence and patient was retreated with carbo Alimta  followed by maintenance Alimta  which she received until December 2023.  He has been off treatment since then due to stable disease but Alimta  was also complicated by worsening anemia and AKI.   Skains while he was on Alimta  showed mild to moderate left pleural effusion.  He has had multiple thoracentesis in the past which were all negative for malignancy. PET scan in October 2024  did not show any evidence of recurrent or progressive disease elsewhere but showed large left pleural effusion with complete left lower lobe collapse.  Also noted to have small to moderate pericardial effusion on his CT scans.    He was [facility] and underwentVATS procedure with decortication and drainage of pleural and pericardial effusion.  Cytology from thoracentesis as well as pericardial fluid was negative for malignancy.  Pericardial biopsy showed nonspecific chronic inflammation.  However his visceral pleural biopsy showed scant foci of non-small cell carcinoma only within lymphovascular space.   NGS testing back in 2021 showed no evidence of actionable mutations.  Tumor mutational burden 0.  MSI stable PD-L1 10%   He was noted to have moderate to large left pleural effusions in multiple locations along with small to moderate pericardial effusion for which she underwent thoracentesis in the past and all of them have been negative for malignancy. He was seen by CT surgery in March 2025 and underwent VATS procedure along with decortication. Pleural and pericardial fluid was again negative for malignancy. Pericardial biopsy showed no malignancy but there were foci of adenocarcinoma within the lymphovascular space noted in the visceral pleura.  He was started on third line palliative gemcitabine .  Docetaxel could not be attempted given his prior reaction to Taxol     Interval history-tolerated chemotherapy well.  He has shortness of breath which has overall remained stable and intact improved after thoracentesis  ECOG PS- 2 Pain scale- 2 Opioid associated constipation- no  Review of systems- Review of Systems  Constitutional:  Positive for malaise/fatigue. Negative for chills, fever and weight loss.  HENT:  Negative  for congestion, ear discharge and nosebleeds.   Eyes:  Negative for blurred vision.  Respiratory:  Negative for cough, hemoptysis, sputum production, shortness of breath  and wheezing.   Cardiovascular:  Negative for chest pain, palpitations, orthopnea and claudication.  Gastrointestinal:  Negative for abdominal pain, blood in stool, constipation, diarrhea, heartburn, melena, nausea and vomiting.  Genitourinary:  Negative for dysuria, flank pain, frequency, hematuria and urgency.  Musculoskeletal:  Negative for back pain, joint pain and myalgias.  Skin:  Negative for rash.  Neurological:  Negative for dizziness, tingling, focal weakness, seizures, weakness and headaches.  Endo/Heme/Allergies:  Does not bruise/bleed easily.  Psychiatric/Behavioral:  Negative for depression and suicidal ideas. The patient does not have insomnia.       Allergies  Allergen Reactions   Taxol  [Paclitaxel ] Other (See Comments)    Chest pain, hip pain, coughing , wheezing     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 [facility])      Past Surgical History:  Procedure Laterality Date   CHEST TUBE INSERTION Right 04/25/2023   Procedure: RIGHT PLEURAL CHEST TUBE INSERTION;  Surgeon: Zelphia Higashi, MD;  Location: [facility] OR;  Service: Thoracic;  Laterality: Right;   ESOPHAGOGASTRODUODENOSCOPY (EGD) WITH PROPOFOL  N/A 12/01/2020   Procedure: ESOPHAGOGASTRODUODENOSCOPY (EGD) WITH PROPOFOL ;  Surgeon: Selena Daily, MD;  Location: [facility] ENDOSCOPY;  Service: Gastroenterology;  Laterality: N/A;   IR THORACENTESIS ASP PLEURAL SPACE W/IMG GUIDE  12/26/2022   PERICARDIAL FLUID DRAINAGE N/A 04/25/2023   Procedure: DRAINAGE, PERICARDIAL EFFUSION;  Surgeon: Zelphia Higashi, MD;  Location: [facility] OR;  Service: Thoracic;  Laterality: N/A;   PERICARDIAL WINDOW N/A 04/25/2023   Procedure: CREATION, PERICARDIAL WINDOW;  Surgeon: Zelphia Higashi, MD;  Location: [facility] OR;  Service: Thoracic;  Laterality: N/A;   PLEURAL EFFUSION DRAINAGE Bilateral 04/25/2023   Procedure: DRAINAGE, PLEURAL  EFFUSION;  Surgeon: Zelphia Higashi, MD;  Location: [facility] OR;  Service: Thoracic;  Laterality: Bilateral;   PORTA CATH INSERTION N/A 10/22/2019   Procedure: PORTA CATH INSERTION;  Surgeon: Jackquelyn Mass, MD;  Location: [facility] INVASIVE CV LAB;  Service: Cardiovascular;  Laterality: N/A;   VIDEO ASSISTED THORACOSCOPY (VATS)/DECORTICATION Left 04/25/2023   Procedure: VIDEO ASSISTED THORACOSCOPY (VATS)/DECORTICATION;  Surgeon: Zelphia Higashi, MD;  Location: [facility] OR;  Service: Thoracic;  Laterality: Left;   VIDEO BRONCHOSCOPY WITH ENDOBRONCHIAL ULTRASOUND N/A 09/25/2019   Procedure: VIDEO BRONCHOSCOPY WITH ENDOBRONCHIAL ULTRASOUND;  Surgeon: Marc Senior, MD;  Location: [facility] ORS;  Service: Pulmonary;  Laterality: N/A;    Social History   Socioeconomic History   Marital status: Single    Spouse name: Not on file   Number of children: Not on file   Years of education: Not on file   Highest education level: Not on file  Occupational History   Not on file  Tobacco Use   Smoking status: Former    Current packs/day: 0.00    Average packs/day: 1 pack/day for 20.0 years (20.0 ttl pk-yrs)    Types: Cigarettes    Start date: 65    Quit date: 86    Years since quitting: 31.3   Smokeless tobacco: Never  Vaping Use   Vaping status: Never Used  Substance and Sexual Activity   Alcohol use: Not Currently    Comment: not drank any beer in 2 onths   Drug use: Never   Sexual activity:  Not Currently  Other Topics Concern   Not on file  Social History Narrative   Not on file   Social Drivers of Health   Financial Resource Strain: Not on file  Food Insecurity: No Food Insecurity (04/23/2023)   Hunger Vital Sign    Worried About Running Out of Food in the Last Year: Never true    Ran Out of Food in the Last Year: Never true  Transportation Needs: No Transportation Needs (04/23/2023)   PRAPARE - Administrator, Civil Service (Medical): No    Lack of Transportation  (Non-Medical): No  Physical Activity: Not on file  Stress: Not on file  Social Connections: Socially Isolated (04/23/2023)   Social Connection and Isolation Panel [NHANES]    Frequency of Communication with Friends and Family: More than three times a week    Frequency of Social Gatherings with Friends and Family: Twice a week    Attends Religious Services: Never    Database administrator or Organizations: No    Attends Banker Meetings: Never    Marital Status: Never married  Intimate Partner Violence: Not At Risk (04/23/2023)   Humiliation, Afraid, Rape, and Kick questionnaire    Fear of Current or Ex-Partner: No    Emotionally Abused: No    Physically Abused: No    Sexually Abused: No    Family History  Problem Relation Age of Onset   Cancer Brother      Current Outpatient Medications:    acetaminophen  (TYLENOL ) 500 MG tablet, Take 500 mg by mouth every 6 (six) hours as needed for mild pain (pain score 1-3) (takes as needed for generalized pain.  Denies pain at this time.)., Disp: , Rfl:    ANORO ELLIPTA  62.5-25 MCG/ACT AEPB, INHALE 1 PUFF INTO THE LUNGS DAILY, Disp: 60 each, Rfl: 2   lidocaine -prilocaine  (EMLA ) cream, Apply to affected area once, Disp: 30 g, Rfl: 3   ondansetron  (ZOFRAN ) 8 MG tablet, Take 1 tablet (8 mg total) by mouth every 8 (eight) hours as needed for nausea or vomiting., Disp: 30 tablet, Rfl: 1   prochlorperazine  (COMPAZINE ) 10 MG tablet, Take 1 tablet (10 mg total) by mouth every 6 (six) hours as needed for nausea or vomiting., Disp: 30 tablet, Rfl: 1   umeclidinium-vilanterol (ANORO ELLIPTA ) 62.5-25 MCG/ACT AEPB, Inhale 1 puff into the lungs daily., Disp: 1 each, Rfl: 3 No current facility-administered medications for this visit.  Facility-Administered Medications Ordered in Other Visits:    0.9 %  sodium chloride  infusion, , Intravenous, Continuous, Avonne Boettcher, MD, Last Rate: 5 mL/hr at 06/14/23 1246, Infusion Verify at 06/14/23 1246    gemcitabine  (GEMZAR ) 1,482 mg in sodium chloride  0.9 % 250 mL chemo infusion, 800 mg/m2 (Treatment Plan Recorded), Intravenous, Once, Avonne Boettcher, MD, Last Rate: 578 mL/hr at 06/14/23 1246, Infusion Verify at 06/14/23 1246   heparin  lock flush 100 unit/mL, 500 Units, Intracatheter, Once PRN, Avonne Boettcher, MD  Physical exam:  Vitals:   06/14/23 1031  BP: 122/74  Pulse: 100  Resp: 16  Temp: (!) 97.4 F (36.3 C)  TempSrc: Tympanic  SpO2: 98%  Weight: 155 lb (70.3 kg)   Physical Exam Cardiovascular:     Rate and Rhythm: Normal rate and regular rhythm.     Heart sounds: Normal heart sounds.  Pulmonary:     Effort: Pulmonary effort is normal.     Comments: Breath sounds decreased over left lung base Abdominal:  General: Bowel sounds are normal.     Palpations: Abdomen is soft.  Skin:    General: Skin is warm and dry.  Neurological:     Mental Status: He is alert and oriented to person, place, and time.      I have personally reviewed labs listed below:    Latest Ref Rng & Units 06/14/2023   10:12 AM  CMP  Glucose 70 - 99 mg/dL 161   BUN 8 - 23 mg/dL 13   Creatinine 0.96 - 1.24 mg/dL 0.45   Sodium 409 - 811 mmol/L 138   Potassium 3.5 - 5.1 mmol/L 3.1   Chloride 98 - 111 mmol/L 107   CO2 22 - 32 mmol/L 23   Calcium 8.9 - 10.3 mg/dL 8.3   Total Protein 6.5 - 8.1 g/dL 7.1   Total Bilirubin 0.0 - 1.2 mg/dL 0.4   Alkaline Phos 38 - 126 U/L 63   AST 15 - 41 U/L 15   ALT 0 - 44 U/L 9       Latest Ref Rng & Units 06/14/2023   10:12 AM  CBC  WBC 4.0 - 10.5 K/uL 6.0   Hemoglobin 13.0 - 17.0 g/dL 9.4   Hematocrit 91.4 - 52.0 % 31.5   Platelets 150 - 400 K/uL 693    I have personally reviewed Radiology images listed below: No images are attached to the encounter.  DG Chest 2 View Result Date: 05/16/2023 CLINICAL DATA:  Pleural effusion, vats EXAM: CHEST - 2 VIEW COMPARISON:  Multiple, most recent May 02, 2023; March 23, 2023 FINDINGS: No cardiomegaly.  Significantly improved aeration of the lungs. Small airspace opacity along the lateral right lung base, possibly scarring. Trace right pleural effusion. Small left pleural effusion. Similar lucency along the left mid lung zone, possibly loculated pleural gas. Patchy airspace opacities again noted in the left lung base. Interval removal of the left-sided thoracostomy tube. Similarly positioned right chest port, terminating at the cavoatrial junction. Multilevel degenerative disc disease of the spine. IMPRESSION: 1. Improved aeration. Patchy airspace opacities again noted in the left lung base, possibly atelectasis or scarring. Similar lucency along the lateral left mid lung, likely the loculated pleural gas noted on the prior study. 2. Trace right and small left pleural effusions. Electronically Signed   By: Rance Burrows M.D.   On: 05/16/2023 08:43     Assessment and plan- Patient is a 73 y.o. male  with recurrent adenocarcinoma of the lung. He is here for on treatment prior to cycle 2-day 1 of gemcitabine  chemotherapy  Counts okay to proceed with cycle 2-day 1 of gemcitabine  chemotherapy today.  Given his ongoing anemia reducing the dose of gemcitabine  to 800 mg/m 2 weeks on and 1 week off.  I will see him back in 3 weeks for cycle 3-day 1.  We will repeat scans after 4 cycles.  Hemoglobin today is 9.4 and iron studies from March 2024 showed iron saturation of 9%.  Ferritin levels were elevated at 498 but that could also be seen in patients with active malignancy.  I am therefore proceeding with 2 doses of Feraheme  at this time.  He has received Feraheme  in the past and has tolerated it well.  Discussed the benefits of IV iron including all but not limited to possible risk of infusion reaction.  Patient understands and agrees to proceed as planned.  Hypokalemia: Will give him 1 L of IV fluids and IV potassium today.  Will send him home with  oral potassium.   Visit Diagnosis 1. Malignant neoplasm of  lower lobe of left lung (HCC)   2. Antineoplastic chemotherapy induced anemia   3. Iron deficiency anemia, unspecified iron deficiency anemia type   4. Encounter for antineoplastic chemotherapy      Dr. Seretha Dance, MD, MPH [facility] [phone] 06/14/2023 12:50 PM

## 2023-06-21 ENCOUNTER — Inpatient Hospital Stay: Attending: Oncology

## 2023-06-21 ENCOUNTER — Other Ambulatory Visit: Payer: Self-pay | Admitting: Oncology

## 2023-06-21 ENCOUNTER — Ambulatory Visit: Admitting: Oncology

## 2023-06-21 ENCOUNTER — Encounter: Payer: Self-pay | Admitting: Oncology

## 2023-06-21 ENCOUNTER — Inpatient Hospital Stay

## 2023-06-21 VITALS — BP 121/72 | HR 106 | Temp 97.6°F | Resp 17

## 2023-06-21 DIAGNOSIS — I10 Essential (primary) hypertension: Secondary | ICD-10-CM | POA: Insufficient documentation

## 2023-06-21 DIAGNOSIS — Z79899 Other long term (current) drug therapy: Secondary | ICD-10-CM | POA: Insufficient documentation

## 2023-06-21 DIAGNOSIS — D649 Anemia, unspecified: Secondary | ICD-10-CM | POA: Insufficient documentation

## 2023-06-21 DIAGNOSIS — C3432 Malignant neoplasm of lower lobe, left bronchus or lung: Secondary | ICD-10-CM

## 2023-06-21 DIAGNOSIS — R0602 Shortness of breath: Secondary | ICD-10-CM | POA: Insufficient documentation

## 2023-06-21 DIAGNOSIS — I3139 Other pericardial effusion (noninflammatory): Secondary | ICD-10-CM | POA: Insufficient documentation

## 2023-06-21 DIAGNOSIS — C782 Secondary malignant neoplasm of pleura: Secondary | ICD-10-CM | POA: Insufficient documentation

## 2023-06-21 DIAGNOSIS — E876 Hypokalemia: Secondary | ICD-10-CM | POA: Insufficient documentation

## 2023-06-21 DIAGNOSIS — Z87891 Personal history of nicotine dependence: Secondary | ICD-10-CM | POA: Insufficient documentation

## 2023-06-21 DIAGNOSIS — J90 Pleural effusion, not elsewhere classified: Secondary | ICD-10-CM | POA: Insufficient documentation

## 2023-06-21 DIAGNOSIS — Z5112 Encounter for antineoplastic immunotherapy: Secondary | ICD-10-CM | POA: Diagnosis not present

## 2023-06-21 DIAGNOSIS — E785 Hyperlipidemia, unspecified: Secondary | ICD-10-CM | POA: Diagnosis not present

## 2023-06-21 DIAGNOSIS — E538 Deficiency of other specified B group vitamins: Secondary | ICD-10-CM

## 2023-06-21 DIAGNOSIS — J948 Other specified pleural conditions: Secondary | ICD-10-CM | POA: Insufficient documentation

## 2023-06-21 DIAGNOSIS — N179 Acute kidney failure, unspecified: Secondary | ICD-10-CM | POA: Diagnosis not present

## 2023-06-21 LAB — CMP (CANCER CENTER ONLY)
ALT: 14 U/L (ref 0–44)
AST: 22 U/L (ref 15–41)
Albumin: 3 g/dL — ABNORMAL LOW (ref 3.5–5.0)
Alkaline Phosphatase: 65 U/L (ref 38–126)
Anion gap: 9 (ref 5–15)
BUN: 10 mg/dL (ref 8–23)
CO2: 24 mmol/L (ref 22–32)
Calcium: 8.8 mg/dL — ABNORMAL LOW (ref 8.9–10.3)
Chloride: 105 mmol/L (ref 98–111)
Creatinine: 1.13 mg/dL (ref 0.61–1.24)
GFR, Estimated: >60 mL/min (ref >60)
Glucose, Bld: 106 mg/dL — ABNORMAL HIGH (ref 70–99)
Potassium: 3.6 mmol/L (ref 3.5–5.1)
Sodium: 138 mmol/L (ref 135–145)
Total Bilirubin: 0.5 mg/dL (ref 0.0–1.2)
Total Protein: 7.8 g/dL (ref 6.5–8.1)

## 2023-06-21 LAB — CBC WITH DIFFERENTIAL (CANCER CENTER ONLY)
Abs Immature Granulocytes: 0.02 10*3/uL (ref 0.00–0.07)
Basophils Absolute: 0 10*3/uL (ref 0.0–0.1)
Basophils Relative: 1 %
Eosinophils Absolute: 0 10*3/uL (ref 0.0–0.5)
Eosinophils Relative: 0 %
HCT: 31.4 % — ABNORMAL LOW (ref 39.0–52.0)
Hemoglobin: 9.5 g/dL — ABNORMAL LOW (ref 13.0–17.0)
Immature Granulocytes: 1 %
Lymphocytes Relative: 29 %
Lymphs Abs: 0.8 10*3/uL (ref 0.7–4.0)
MCH: 22.8 pg — ABNORMAL LOW (ref 26.0–34.0)
MCHC: 30.3 g/dL (ref 30.0–36.0)
MCV: 75.5 fL — ABNORMAL LOW (ref 80.0–100.0)
Monocytes Absolute: 0.6 10*3/uL (ref 0.1–1.0)
Monocytes Relative: 20 %
Neutro Abs: 1.5 10*3/uL — ABNORMAL LOW (ref 1.7–7.7)
Neutrophils Relative %: 49 %
Platelet Count: 578 10*3/uL — ABNORMAL HIGH (ref 150–400)
RBC: 4.16 MIL/uL — ABNORMAL LOW (ref 4.22–5.81)
RDW: 21.7 % — ABNORMAL HIGH (ref 11.5–15.5)
WBC Count: 2.9 10*3/uL — ABNORMAL LOW (ref 4.0–10.5)
nRBC: 0.7 % — ABNORMAL HIGH (ref 0.0–0.2)

## 2023-06-21 MED ORDER — PROCHLORPERAZINE MALEATE 10 MG PO TABS
10.0000 mg | ORAL_TABLET | Freq: Once | ORAL | Status: AC
  Administered 2023-06-21: 10 mg via ORAL
  Filled 2023-06-21: qty 1

## 2023-06-21 MED ORDER — SODIUM CHLORIDE 0.9 % IV SOLN
510.0000 mg | INTRAVENOUS | Status: DC
  Administered 2023-06-21: 510 mg via INTRAVENOUS
  Filled 2023-06-21: qty 510

## 2023-06-21 MED ORDER — HEPARIN SOD (PORK) LOCK FLUSH 100 UNIT/ML IV SOLN
500.0000 [IU] | Freq: Once | INTRAVENOUS | Status: AC | PRN
  Administered 2023-06-21: 500 [IU]
  Filled 2023-06-21: qty 5

## 2023-06-21 MED ORDER — SODIUM CHLORIDE 0.9 % IV SOLN
INTRAVENOUS | Status: DC
  Filled 2023-06-21: qty 250

## 2023-06-21 MED ORDER — SODIUM CHLORIDE 0.9 % IV SOLN
800.0000 mg/m2 | Freq: Once | INTRAVENOUS | Status: AC
  Administered 2023-06-21: 1482 mg via INTRAVENOUS
  Filled 2023-06-21: qty 38.98

## 2023-06-21 NOTE — Patient Instructions (Signed)
 [facility] - A DEPT OF [facility]  Discharge Instructions: Thank you for choosing [facility] to provide your oncology and hematology care.  If you have a lab appointment with the Cancer Center, please go directly to the Cancer Center and check in at the registration area.  Wear comfortable clothing and clothing appropriate for easy access to any Portacath or PICC line.   We strive to give you quality time with your provider. You may need to reschedule your appointment if you arrive late (15 or more minutes).  Arriving late affects you and other patients whose appointments are after yours.  Also, if you miss three or more appointments without notifying the office, you may be dismissed from the clinic at the provider's discretion.      For prescription refill requests, have your pharmacy contact our office and allow 72 hours for refills to be completed.    Today you received the following chemotherapy and/or immunotherapy agents: Gemzar    To help prevent nausea and vomiting after your treatment, we encourage you to take your nausea medication as directed.  BELOW ARE SYMPTOMS THAT SHOULD BE REPORTED IMMEDIATELY: *FEVER GREATER THAN 100.4 F (38 C) OR HIGHER *CHILLS OR SWEATING *NAUSEA AND VOMITING THAT IS NOT CONTROLLED WITH YOUR NAUSEA MEDICATION *UNUSUAL SHORTNESS OF BREATH *UNUSUAL BRUISING OR BLEEDING *URINARY PROBLEMS (pain or burning when urinating, or frequent urination) *BOWEL PROBLEMS (unusual diarrhea, constipation, pain near the anus) TENDERNESS IN MOUTH AND THROAT WITH OR WITHOUT PRESENCE OF ULCERS (sore throat, sores in mouth, or a toothache) UNUSUAL RASH, SWELLING OR PAIN  UNUSUAL VAGINAL DISCHARGE OR ITCHING   Items with * indicate a potential emergency and should be followed up as soon as possible or go to the Emergency Department if any problems should occur.  Please show the CHEMOTHERAPY ALERT CARD or IMMUNOTHERAPY ALERT  CARD at check-in to the Emergency Department and triage nurse.  Should you have questions after your visit or need to cancel or reschedule your appointment, please contact [facility] - A DEPT OF [facility]  [phone] and follow the prompts.  Office hours are 8:00 a.m. to 4:30 p.m. Monday - Friday. Please note that voicemails left after 4:00 p.m. may not be returned until the following business day.  We are closed weekends and major holidays. You have access to a nurse at all times for urgent questions. Please call the main number to the clinic [phone] and follow the prompts.  For any non-urgent questions, you may also contact your provider using MyChart. We now offer e-Visits for anyone 70 and older to request care online for non-urgent symptoms. For details visit [email or web address].   Also download the MyChart app! Go to the app store, search "MyChart", open the app, select , and log in with your MyChart username and password.  Ferumoxytol  Injection What is this medication? FERUMOXYTOL  (FER ue MOX i tol) treats low levels of iron in your body (iron deficiency anemia). Iron is a mineral that plays an important role in making red blood cells, which carry oxygen from your lungs to the rest of your body. This medicine may be used for other purposes; ask your health care provider or pharmacist if you have questions. COMMON BRAND NAME(S): Feraheme  What should I tell my care team before I take this medication? They need to know if you have any of these conditions: Anemia not caused by low iron  levels High levels of iron in the blood Magnetic resonance imaging (MRI) test scheduled An unusual or allergic reaction to iron, other medications, foods, dyes, or preservatives Pregnant or trying to get pregnant Breastfeeding How should I use this medication? This medication is injected into a vein. It is given by your care team in a hospital or  clinic setting. Talk to your care team the use of this medication in children. Special care may be needed. Overdosage: If you think you have taken too much of this medicine contact a poison control center or emergency room at once. NOTE: This medicine is only for you. Do not share this medicine with others. What if I miss a dose? It is important not to miss your dose. Call your care team if you are unable to keep an appointment. What may interact with this medication? Other iron products This list may not describe all possible interactions. Give your health care provider a list of all the medicines, herbs, non-prescription drugs, or dietary supplements you use. Also tell them if you smoke, drink alcohol, or use illegal drugs. Some items may interact with your medicine. What should I watch for while using this medication? Visit your care team for regular checks on your progress. Tell your care team if your symptoms do not start to get better or if they get worse. You may need blood work done while you are taking this medication. You may need to eat more foods that contain iron. Talk to your care team. Foods that contain iron include whole grains or cereals, dried fruits, beans, peas, leafy green vegetables, and organ meats (liver, kidney). What side effects may I notice from receiving this medication? Side effects that you should report to your care team as soon as possible: Allergic reactions--skin rash, itching, hives, swelling of the face, lips, tongue, or throat Low blood pressure--dizziness, feeling faint or lightheaded, blurry vision Shortness of breath Side effects that usually do not require medical attention (report to your care team if they continue or are bothersome): Flushing Headache Joint pain Muscle pain Nausea Pain, redness, or irritation at injection site This list may not describe all possible side effects. Call your doctor for medical advice about side effects. You may  report side effects to FDA at 1-800-FDA-1088. Where should I keep my medication? This medication is given in a hospital or clinic. It will not be stored at home. NOTE: This sheet is a summary. It may not cover all possible information. If you have questions about this medicine, talk to your doctor, pharmacist, or health care provider.  2024 Elsevier/Gold Standard (2022-09-21 00:00:00)

## 2023-06-21 NOTE — Progress Notes (Signed)
 Per MD ok to proceed with Gemzar  HR 106 recheck 104

## 2023-06-22 ENCOUNTER — Other Ambulatory Visit: Payer: Self-pay | Admitting: Oncology

## 2023-06-26 ENCOUNTER — Other Ambulatory Visit: Payer: Self-pay | Admitting: Thoracic Surgery (Cardiothoracic Vascular Surgery)

## 2023-06-26 DIAGNOSIS — J90 Pleural effusion, not elsewhere classified: Secondary | ICD-10-CM

## 2023-06-26 DIAGNOSIS — C3432 Malignant neoplasm of lower lobe, left bronchus or lung: Secondary | ICD-10-CM

## 2023-06-27 ENCOUNTER — Ambulatory Visit (HOSPITAL_COMMUNITY)
Admission: RE | Admit: 2023-06-27 | Discharge: 2023-06-27 | Disposition: A | Discharge status: Home / Self Care | Source: Ambulatory Visit | Attending: Cardiovascular Disease | Admitting: Cardiovascular Disease

## 2023-06-27 ENCOUNTER — Ambulatory Visit (INDEPENDENT_AMBULATORY_CARE_PROVIDER_SITE_OTHER): Admitting: Thoracic Surgery (Cardiothoracic Vascular Surgery)

## 2023-06-27 ENCOUNTER — Encounter: Payer: Self-pay | Admitting: Thoracic Surgery (Cardiothoracic Vascular Surgery)

## 2023-06-27 VITALS — BP 129/80 | HR 100 | Resp 20 | Ht 70.0 in | Wt 155.0 lb

## 2023-06-27 DIAGNOSIS — J90 Pleural effusion, not elsewhere classified: Secondary | ICD-10-CM

## 2023-06-27 DIAGNOSIS — C3432 Malignant neoplasm of lower lobe, left bronchus or lung: Secondary | ICD-10-CM

## 2023-06-27 NOTE — Progress Notes (Signed)
 [address]       Mitchell Frye [address]             [phone]     HPI: Mitchell Frye returns for follow-up after recent left VATS for drainage of the pleural effusion, decortication, and pericardial window.  Mitchell Frye is a 73 year old male with a history of stage IIIb adenocarcinoma of the lung.  He was diagnosed in 2021 and treated with chemoradiation and durvalumab .  He had recurrence and was treated with carboplatin  and Alimta .  Developed recurrent pleural effusions.  I had seen him in 2024 and recommended VATS for drainage of effusion and decortication.  He refused at that time.  He was admitted in March with worsening shortness of breath and right-sided chest pain.  He had bilateral pleural effusions and a large pericardial effusion.  I did a left VATS for drainage of his pleural effusion, partial decortication, and a pericardial window on 04/25/2023.  Also placed a right sided chest tube at that time.  He had some delirium postoperatively but returned to baseline prior to discharge.  Went home on postoperative day #7.  I saw him in the office on 05/16/2023.  He was doing well.  In the interim at that visit he saw Dr. Randy Buttery.  He has been started on third line treatment with gemcitabine .  Tolerating that well so far.  Some soreness but not requiring any medication for pain.  Breathing has been stable.  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 (HCC)     Current Outpatient Medications  Medication Sig Dispense Refill   albuterol  (VENTOLIN  HFA) 108 (90 Base) MCG/ACT inhaler INHALE 2 PUFFS INTO THE LUNGS EVERY 4 HOURS AS NEEDED FOR WHEEZING OR SHORTNESS OF BREATH 6.7 g 2   acetaminophen  (TYLENOL ) 500 MG tablet Take 500 mg by mouth every 6 (six) hours as needed for mild pain (pain score 1-3) (takes as needed for generalized pain.  Denies pain at this time.).     ANORO ELLIPTA   62.5-25 MCG/ACT AEPB INHALE 1 PUFF INTO THE LUNGS DAILY 60 each 2   lidocaine -prilocaine  (EMLA ) cream Apply to affected area once 30 g 3   ondansetron  (ZOFRAN ) 8 MG tablet Take 1 tablet (8 mg total) by mouth every 8 (eight) hours as needed for nausea or vomiting. 30 tablet 1   prochlorperazine  (COMPAZINE ) 10 MG tablet Take 1 tablet (10 mg total) by mouth every 6 (six) hours as needed for nausea or vomiting. 30 tablet 1   umeclidinium-vilanterol (ANORO ELLIPTA ) 62.5-25 MCG/ACT AEPB Inhale 1 puff into the lungs daily. 1 each 3   No current facility-administered medications for this visit.    Physical Exam BP 129/80   Pulse 100   Resp 20   Ht 5\' 10"  (1.778 m)   Wt 155 lb (70.3 kg)   SpO2 97% Comment: RA  BMI 22.11 kg/m  73 year old man in no acute distress Alert and oriented x 3 with no focal deficits Lungs diminished breath sounds at left base otherwise clear Incision well-healed Cardiac regular rate and rhythm  Diagnostic Tests: I personally reviewed his chest x-ray images.  Official reading has not been done.  Left pleural effusion increased from his most recent film.  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  He underwent left VATS for pericardial window as well as drainage of the pleural effusion and decortication.  Unfortunately, his lung could not be completely decorticated and he still had some residual space postoperatively.  Currently doing well from a surgical standpoint.  He is not having to take any pain medication.  Respiratory status has remained stable.  He does have some recurrent left pleural effusion.  To some degree that is to be expected this the fluid will fill the space of the lung could not fill.  Will have to follow that and if it gets larger or symptomatic consider pleural catheter placement for long-term  management.  Plan: Return in 6 weeks with PA lateral chest x-ray. May need to consider pleural catheter if effusion becomes symptomatic.  Mitchell Higashi, MD [facility] [phone]

## 2023-06-28 ENCOUNTER — Inpatient Hospital Stay

## 2023-06-28 VITALS — BP 142/87 | HR 107 | Temp 97.7°F | Resp 18

## 2023-06-28 DIAGNOSIS — E538 Deficiency of other specified B group vitamins: Secondary | ICD-10-CM

## 2023-06-28 DIAGNOSIS — C3432 Malignant neoplasm of lower lobe, left bronchus or lung: Secondary | ICD-10-CM | POA: Diagnosis not present

## 2023-06-28 MED ORDER — SODIUM CHLORIDE 0.9 % IV SOLN
510.0000 mg | INTRAVENOUS | Status: DC
  Administered 2023-06-28: 510 mg via INTRAVENOUS
  Filled 2023-06-28: qty 510

## 2023-06-28 MED ORDER — SODIUM CHLORIDE 0.9% FLUSH
10.0000 mL | Freq: Once | INTRAVENOUS | Status: AC | PRN
  Administered 2023-06-28: 10 mL
  Filled 2023-06-28: qty 10

## 2023-06-28 MED ORDER — SODIUM CHLORIDE 0.9 % IV SOLN
INTRAVENOUS | Status: DC
Start: 2023-06-28 — End: 2023-06-28
  Filled 2023-06-28: qty 250

## 2023-06-28 MED ORDER — HEPARIN SOD (PORK) LOCK FLUSH 100 UNIT/ML IV SOLN
500.0000 [IU] | Freq: Once | INTRAVENOUS | Status: AC | PRN
  Administered 2023-06-28: 500 [IU]
  Filled 2023-06-28: qty 5

## 2023-07-05 ENCOUNTER — Encounter: Payer: Self-pay | Admitting: Oncology

## 2023-07-05 ENCOUNTER — Inpatient Hospital Stay

## 2023-07-05 ENCOUNTER — Inpatient Hospital Stay (HOSPITAL_BASED_OUTPATIENT_CLINIC_OR_DEPARTMENT_OTHER): Admitting: Oncology

## 2023-07-05 VITALS — BP 102/86 | HR 103

## 2023-07-05 VITALS — BP 137/77 | HR 100 | Temp 97.4°F | Resp 19 | Ht 70.0 in | Wt 157.0 lb

## 2023-07-05 DIAGNOSIS — C3432 Malignant neoplasm of lower lobe, left bronchus or lung: Secondary | ICD-10-CM | POA: Diagnosis not present

## 2023-07-05 DIAGNOSIS — R12 Heartburn: Secondary | ICD-10-CM | POA: Diagnosis not present

## 2023-07-05 DIAGNOSIS — Z5111 Encounter for antineoplastic chemotherapy: Secondary | ICD-10-CM

## 2023-07-05 DIAGNOSIS — E876 Hypokalemia: Secondary | ICD-10-CM

## 2023-07-05 LAB — CBC WITH DIFFERENTIAL (CANCER CENTER ONLY)
Abs Immature Granulocytes: 0.05 10*3/uL (ref 0.00–0.07)
Basophils Absolute: 0 10*3/uL (ref 0.0–0.1)
Basophils Relative: 0 %
Eosinophils Absolute: 0 10*3/uL (ref 0.0–0.5)
Eosinophils Relative: 0 %
HCT: 31.1 % — ABNORMAL LOW (ref 39.0–52.0)
Hemoglobin: 9.6 g/dL — ABNORMAL LOW (ref 13.0–17.0)
Immature Granulocytes: 1 %
Lymphocytes Relative: 16 %
Lymphs Abs: 1.2 10*3/uL (ref 0.7–4.0)
MCH: 23.8 pg — ABNORMAL LOW (ref 26.0–34.0)
MCHC: 30.9 g/dL (ref 30.0–36.0)
MCV: 77 fL — ABNORMAL LOW (ref 80.0–100.0)
Monocytes Absolute: 0.9 10*3/uL (ref 0.1–1.0)
Monocytes Relative: 11 %
Neutro Abs: 5.5 10*3/uL (ref 1.7–7.7)
Neutrophils Relative %: 72 %
Platelet Count: 465 10*3/uL — ABNORMAL HIGH (ref 150–400)
RBC: 4.04 MIL/uL — ABNORMAL LOW (ref 4.22–5.81)
RDW: 24.1 % — ABNORMAL HIGH (ref 11.5–15.5)
WBC Count: 7.6 10*3/uL (ref 4.0–10.5)
nRBC: 0 % (ref 0.0–0.2)

## 2023-07-05 LAB — CMP (CANCER CENTER ONLY)
ALT: 11 U/L (ref 0–44)
AST: 17 U/L (ref 15–41)
Albumin: 3 g/dL — ABNORMAL LOW (ref 3.5–5.0)
Alkaline Phosphatase: 58 U/L (ref 38–126)
Anion gap: 9 (ref 5–15)
BUN: 17 mg/dL (ref 8–23)
CO2: 23 mmol/L (ref 22–32)
Calcium: 8.5 mg/dL — ABNORMAL LOW (ref 8.9–10.3)
Chloride: 105 mmol/L (ref 98–111)
Creatinine: 1.12 mg/dL (ref 0.61–1.24)
GFR, Estimated: >60 mL/min (ref >60)
Glucose, Bld: 98 mg/dL (ref 70–99)
Potassium: 3.2 mmol/L — ABNORMAL LOW (ref 3.5–5.1)
Sodium: 137 mmol/L (ref 135–145)
Total Bilirubin: 0.6 mg/dL (ref 0.0–1.2)
Total Protein: 7.7 g/dL (ref 6.5–8.1)

## 2023-07-05 MED ORDER — SODIUM CHLORIDE 0.9 % IV SOLN
INTRAVENOUS | Status: DC
Start: 2023-07-05 — End: 2023-07-05
  Filled 2023-07-05: qty 250

## 2023-07-05 MED ORDER — PROCHLORPERAZINE MALEATE 10 MG PO TABS
10.0000 mg | ORAL_TABLET | Freq: Once | ORAL | Status: AC
  Administered 2023-07-05: 10 mg via ORAL
  Filled 2023-07-05: qty 1

## 2023-07-05 MED ORDER — PANTOPRAZOLE SODIUM 20 MG PO TBEC: DELAYED_RELEASE_TABLET | ORAL | 2 refills | Status: DC

## 2023-07-05 MED ORDER — SODIUM CHLORIDE 0.9 % IV SOLN
800.0000 mg/m2 | Freq: Once | INTRAVENOUS | Status: AC
  Administered 2023-07-05: 1482 mg via INTRAVENOUS
  Filled 2023-07-05: qty 38.98

## 2023-07-05 MED ORDER — POTASSIUM CHLORIDE CRYS ER 20 MEQ PO TBCR
20.0000 meq | EXTENDED_RELEASE_TABLET | Freq: Every day | ORAL | 0 refills | Status: DC
Start: 2023-07-05 — End: 2023-07-11

## 2023-07-05 MED ORDER — SODIUM CHLORIDE 0.9% FLUSH
10.0000 mL | INTRAVENOUS | Status: DC | PRN
Start: 2023-07-05 — End: 2023-07-05
  Administered 2023-07-05: 10 mL
  Filled 2023-07-05: qty 10

## 2023-07-05 MED ORDER — HEPARIN SOD (PORK) LOCK FLUSH 100 UNIT/ML IV SOLN
500.0000 [IU] | Freq: Once | INTRAVENOUS | Status: AC | PRN
  Administered 2023-07-05: 500 [IU]
  Filled 2023-07-05: qty 5

## 2023-07-05 NOTE — Progress Notes (Signed)
 Patient complaining of chest discomfort after eating particular foods (I.e. spicy, fried, heavily seasoned, Babara Bolls). Advised patient not to lay down flat for at least 2 hours after eating; can use pillows to prop up head if needing rest.  Also advised to eat blander, easier to process foods such as toast, rice, lean meats like chicken and fish, et Jackqueline Mason. Patient verbalized understanding.  Also requested a refill for EMLA  to go to [facility]; last script issued 05/17/23 refills 3.  Spoke to Dent who confirmed refills are available and he's filling it now.  Also sent Protonix  and Potassium to same pharmacy for pickup; IF nurse informed to relay information to patient.

## 2023-07-05 NOTE — Progress Notes (Signed)
 Hematology/Oncology Consult note [facility]  [phone] [phone]  Patient Care Team: Center, [facility] as PCP - General Addie Holstein, Vashti Gentles, MD as PCP - Cardiology (Cardiology) Drake Gens, RN as Oncology Nurse Navigator Avonne Boettcher, MD as Consulting Physician (Oncology)   Name of the patient: Mitchell Frye  [id number]  05/30/1950   Date of visit: 07/05/23  Diagnosis- stage IIIB adenocarcinoma of the right lung cT2 cN3 cM0 now with biopsy-proven visceral pleural metastases   Chief complaint/ Reason for visit-on treatment assessment prior to cycle 3-day 1 of gemcitabine  chemotherapy  Heme/Onc history:  Patient is a 73 year old male diagnosed with [name] adenocarcinoma of the lung in September 2021.PET CT scan showed hypermetabolic mass in the left lower lobe with mediastinal adenopathy and right paratracheal adenopathy.  No evidence of distant metastatic disease.  Pathology was consistent with non-small cell lung cancer favor adenocarcinoma.  Cells were positive for TTF-1 and negative for p40.   He received 1 cycle of CarboTaxol and then had significant reaction to Taxol .  He was switched to Abraxane  for 3 more cycles but due to shortage of Abraxane  he was switched to carbo Alimta  for 2 cycles ending in October 2021 concurrent with radiation.  Subsequently he was started on maintenance durvalumab  which she continued until September 2022.     CT scans in October 2022 showed concern for hilar recurrence and patient was retreated with carbo Alimta  followed by maintenance Alimta  which she received until December 2023.  He has been off treatment since then due to stable disease but Alimta  was also complicated by worsening anemia and AKI.   Skains while he was on Alimta  showed mild to moderate left pleural effusion.  He has had multiple thoracentesis in the past which were all negative for malignancy. PET scan in October 2024  did not show any evidence of recurrent or progressive disease elsewhere but showed large left pleural effusion with complete left lower lobe collapse.  Also noted to have small to moderate pericardial effusion on his CT scans.    He was [facility] and underwentVATS procedure with decortication and drainage of pleural and pericardial effusion.  Cytology from thoracentesis as well as pericardial fluid was negative for malignancy.  Pericardial biopsy showed nonspecific chronic inflammation.  However his visceral pleural biopsy showed scant foci of non-small cell carcinoma only within lymphovascular space.   NGS testing back in 2021 showed no evidence of actionable mutations.  Tumor mutational burden 0.  MSI stable PD-L1 10%   He was noted to have moderate to large left pleural effusions in multiple locations along with small to moderate pericardial effusion for which she underwent thoracentesis in the past and all of them have been negative for malignancy. He was seen by CT surgery in March 2025 and underwent VATS procedure along with decortication. Pleural and pericardial fluid was again negative for malignancy. Pericardial biopsy showed no malignancy but there were foci of adenocarcinoma within the lymphovascular space noted in the visceral pleura.  He was started on third line palliative gemcitabine .  Docetaxel could not be attempted given his prior reaction to Taxol     Interval history-he reports occasional symptoms of heartburn.  He has baseline fatigue and shortness of breath which has not changed.  Denies any worsening symptoms today.  ECOG PS- 1 Pain scale- 0 Opioid associated constipation- no  Review of systems- Review of Systems  Constitutional:  Positive for malaise/fatigue. Negative for chills, fever  and weight loss.  HENT:  Negative for congestion, ear discharge and nosebleeds.   Eyes:  Negative for blurred vision.  Respiratory:  Positive for shortness of breath. Negative for  cough, hemoptysis, sputum production and wheezing.   Cardiovascular:  Negative for chest pain, palpitations, orthopnea and claudication.  Gastrointestinal:  Negative for abdominal pain, blood in stool, constipation, diarrhea, heartburn, melena, nausea and vomiting.  Genitourinary:  Negative for dysuria, flank pain, frequency, hematuria and urgency.  Musculoskeletal:  Negative for back pain, joint pain and myalgias.  Skin:  Negative for rash.  Neurological:  Negative for dizziness, tingling, focal weakness, seizures, weakness and headaches.  Endo/Heme/Allergies:  Does not bruise/bleed easily.  Psychiatric/Behavioral:  Negative for depression and suicidal ideas. The patient does not have insomnia.       Allergies  Allergen Reactions   Taxol  [Paclitaxel ] Other (See Comments)    Chest pain, hip pain, coughing , wheezing     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 [facility])      Past Surgical History:  Procedure Laterality Date   CHEST TUBE INSERTION Right 04/25/2023   Procedure: RIGHT PLEURAL CHEST TUBE INSERTION;  Surgeon: Zelphia Higashi, MD;  Location: [facility] OR;  Service: Thoracic;  Laterality: Right;   ESOPHAGOGASTRODUODENOSCOPY (EGD) WITH PROPOFOL  N/A 12/01/2020   Procedure: ESOPHAGOGASTRODUODENOSCOPY (EGD) WITH PROPOFOL ;  Surgeon: Selena Daily, MD;  Location: [facility] ENDOSCOPY;  Service: Gastroenterology;  Laterality: N/A;   IR THORACENTESIS ASP PLEURAL SPACE W/IMG GUIDE  12/26/2022   PERICARDIAL FLUID DRAINAGE N/A 04/25/2023   Procedure: DRAINAGE, PERICARDIAL EFFUSION;  Surgeon: Zelphia Higashi, MD;  Location: [facility] OR;  Service: Thoracic;  Laterality: N/A;   PERICARDIAL WINDOW N/A 04/25/2023   Procedure: CREATION, PERICARDIAL WINDOW;  Surgeon: Zelphia Higashi, MD;  Location: [facility] OR;  Service: Thoracic;  Laterality: N/A;   PLEURAL EFFUSION DRAINAGE Bilateral 04/25/2023    Procedure: DRAINAGE, PLEURAL EFFUSION;  Surgeon: Zelphia Higashi, MD;  Location: [facility] OR;  Service: Thoracic;  Laterality: Bilateral;   PORTA CATH INSERTION N/A 10/22/2019   Procedure: PORTA CATH INSERTION;  Surgeon: Jackquelyn Mass, MD;  Location: [facility] INVASIVE CV LAB;  Service: Cardiovascular;  Laterality: N/A;   VIDEO ASSISTED THORACOSCOPY (VATS)/DECORTICATION Left 04/25/2023   Procedure: VIDEO ASSISTED THORACOSCOPY (VATS)/DECORTICATION;  Surgeon: Zelphia Higashi, MD;  Location[facility] OR;  Service: Thoracic;  Laterality: Left;   VIDEO BRONCHOSCOPY WITH ENDOBRONCHIAL ULTRASOUND N/A 09/25/2019   Procedure: VIDEO BRONCHOSCOPY WITH ENDOBRONCHIAL ULTRASOUND;  Surgeon: Marc Senior, MD;  Location: [facility] ORS;  Service: Pulmonary;  Laterality: N/A;    Social History   Socioeconomic History   Marital status: Single    Spouse name: Not on file   Number of children: Not on file   Years of education: Not on file   Highest education level: Not on file  Occupational History   Not on file  Tobacco Use   Smoking status: Former    Current packs/day: 0.00    Average packs/day: 1 pack/day for 20.0 years (20.0 ttl pk-yrs)    Types: Cigarettes    Start date: 27    Quit date: 68    Years since quitting: 31.4   Smokeless tobacco: Never  Vaping Use   Vaping status: Never Used  Substance and Sexual Activity   Alcohol use: Not Currently    Comment: not drank any beer in 2 onths  Drug use: Never   Sexual activity: Not Currently  Other Topics Concern   Not on file  Social History Narrative   Not on file   Social Drivers of Health   Financial Resource Strain: Not on file  Food Insecurity: No Food Insecurity (04/23/2023)   Hunger Vital Sign    Worried About Running Out of Food in the Last Year: Never true    Ran Out of Food in the Last Year: Never true  Transportation Needs: No Transportation Needs (04/23/2023)   PRAPARE - Administrator, Civil Service (Medical): No     Lack of Transportation (Non-Medical): No  Physical Activity: Not on file  Stress: Not on file  Social Connections: Socially Isolated (04/23/2023)   Social Connection and Isolation Panel [NHANES]    Frequency of Communication with Friends and Family: More than three times a week    Frequency of Social Gatherings with Friends and Family: Twice a week    Attends Religious Services: Never    Database administrator or Organizations: No    Attends Banker Meetings: Never    Marital Status: Never married  Intimate Partner Violence: Not At Risk (04/23/2023)   Humiliation, Afraid, Rape, and Kick questionnaire    Fear of Current or Ex-Partner: No    Emotionally Abused: No    Physically Abused: No    Sexually Abused: No    Family History  Problem Relation Age of Onset   Cancer Brother      Current Outpatient Medications:    albuterol  (VENTOLIN  HFA) 108 (90 Base) MCG/ACT inhaler, INHALE 2 PUFFS INTO THE LUNGS EVERY 4 HOURS AS NEEDED FOR WHEEZING OR SHORTNESS OF BREATH, Disp: 6.7 g, Rfl: 2   pantoprazole  (PROTONIX ) 20 MG tablet, Take 1 tablet (20 mg total) by mouth every morning., Disp: 30 tablet, Rfl: 2   potassium chloride  SA (KLOR-CON  M) 20 MEQ tablet, Take 1 tablet (20 mEq total) by mouth daily., Disp: 7 tablet, Rfl: 0   acetaminophen  (TYLENOL ) 500 MG tablet, Take 500 mg by mouth every 6 (six) hours as needed for mild pain (pain score 1-3) (takes as needed for generalized pain.  Denies pain at this time.)., Disp: , Rfl:    ANORO ELLIPTA  62.5-25 MCG/ACT AEPB, INHALE 1 PUFF INTO THE LUNGS DAILY, Disp: 60 each, Rfl: 2   lidocaine -prilocaine  (EMLA ) cream, Apply to affected area once, Disp: 30 g, Rfl: 3   ondansetron  (ZOFRAN ) 8 MG tablet, Take 1 tablet (8 mg total) by mouth every 8 (eight) hours as needed for nausea or vomiting., Disp: 30 tablet, Rfl: 1   prochlorperazine  (COMPAZINE ) 10 MG tablet, Take 1 tablet (10 mg total) by mouth every 6 (six) hours as needed for nausea or vomiting.,  Disp: 30 tablet, Rfl: 1   umeclidinium-vilanterol (ANORO ELLIPTA ) 62.5-25 MCG/ACT AEPB, Inhale 1 puff into the lungs daily., Disp: 1 each, Rfl: 3 No current facility-administered medications for this visit.  Facility-Administered Medications Ordered in Other Visits:    0.9 %  sodium chloride  infusion, , Intravenous, Continuous, Avonne Boettcher, MD, Stopped at 07/05/23 1251   sodium chloride  flush (NS) 0.9 % injection 10 mL, 10 mL, Intracatheter, PRN, Avonne Boettcher, MD, 10 mL at 07/05/23 1253  Physical exam:  Vitals:   07/05/23 1033  BP: 137/77  Pulse: 100  Resp: 19  Temp: (!) 97.4 F (36.3 C)  TempSrc: Tympanic  SpO2: 99%  Weight: 157 lb (71.2 kg)  Height: 5\' 10"  (1.778 m)  Physical Exam Cardiovascular:     Rate and Rhythm: Normal rate and regular rhythm.     Heart sounds: Normal heart sounds.  Pulmonary:     Effort: Pulmonary effort is normal.     Comments: Breath sounds decreased over right lung base Skin:    General: Skin is warm and dry.  Neurological:     Mental Status: He is alert and oriented to person, place, and time.     I have personally reviewed labs listed below:    Latest Ref Rng & Units 07/05/2023   10:12 AM  CMP  Glucose 70 - 99 mg/dL 98   BUN 8 - 23 mg/dL 17   Creatinine 9.62 - 1.24 mg/dL 9.52   Sodium 841 - 324 mmol/L 137   Potassium 3.5 - 5.1 mmol/L 3.2   Chloride 98 - 111 mmol/L 105   CO2 22 - 32 mmol/L 23   Calcium 8.9 - 10.3 mg/dL 8.5   Total Protein 6.5 - 8.1 g/dL 7.7   Total Bilirubin 0.0 - 1.2 mg/dL 0.6   Alkaline Phos 38 - 126 U/L 58   AST 15 - 41 U/L 17   ALT 0 - 44 U/L 11       Latest Ref Rng & Units 07/05/2023   10:12 AM  CBC  WBC 4.0 - 10.5 K/uL 7.6   Hemoglobin 13.0 - 17.0 g/dL 9.6   Hematocrit 40.1 - 52.0 % 31.1   Platelets 150 - 400 K/uL 465    I have personally reviewed Radiology images listed below: No images are attached to the encounter.  DG Chest 2 View Result Date: 06/27/2023 CLINICAL DATA:  Pleural effusion.  Malignant neoplasm of left lower lobe. EXAM: CHEST - 2 VIEW COMPARISON:  May 16, 2023. FINDINGS: Stable cardiomediastinal silhouette. Right internal jugular Port-A-Cath is unchanged. Minimal right basilar subsegmental atelectasis is noted with small right pleural effusion. Probable left basilar hydropneumothorax is noted. IMPRESSION: Probable left basilar hydropneumothorax. CT scan is recommended for further evaluation. Electronically Signed   By: Rosalene Colon M.D.   On: 06/27/2023 12:42     Assessment and plan- Patient is a 73 y.o. male with history of recurrent adenocarcinoma of the lung here for on treatment assessment prior to cycle 3-day 1 of gemcitabine  chemotherapy  Counts okay to proceed with cycle 3-day 1 of gemcitabine  chemotherapy today.  He will directly proceed for cycle 3-day 8 of treatment next week and I will see him back in 3 weeks for cycle 4-day 1.  We will plan to repeat scans after 4 cycles.  Clinically patient is doing well and he denies any worsening shortness of breath.  He was also evaluated by Dr. Luna Salinas from cardiothoracic surgery and plan is to consider Pleurx catheter placement should he have recurrent accumulation of his pleural effusion.  He did have a chest x-ray on 06/27/2023 which showed small right pleural effusion and left basilar hydropneumothorax.  Clinically he is at his baseline and does not require any intervention at this time.  Heartburn: I will start him on Protonix  20 mg daily  Hypokalemia: We will send him a prescription for oral potassium 20 mill equivalents daily for 2 weeks   Visit Diagnosis 1. 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)   2. Heartburn   3. Encounter for antineoplastic chemotherapy   4. Hypokalemia      Dr. Seretha Dance, MD, MPH [facility] [phone] 07/05/2023 1:09 PM

## 2023-07-05 NOTE — Patient Instructions (Signed)
 [facility] - A DEPT OF [facility]  Discharge Instructions: Thank you for choosing [facility] to provide your oncology and hematology care.  If you have a lab appointment with the Cancer Center, please go directly to the Cancer Center and check in at the registration area.  Wear comfortable clothing and clothing appropriate for easy access to any Portacath or PICC line.   We strive to give you quality time with your provider. You may need to reschedule your appointment if you arrive late (15 or more minutes).  Arriving late affects you and other patients whose appointments are after yours.  Also, if you miss three or more appointments without notifying the office, you may be dismissed from the clinic at the provider's discretion.      For prescription refill requests, have your pharmacy contact our office and allow 72 hours for refills to be completed.    Today you received the following chemotherapy and/or immunotherapy agents- gemzar      To help prevent nausea and vomiting after your treatment, we encourage you to take your nausea medication as directed.  BELOW ARE SYMPTOMS THAT SHOULD BE REPORTED IMMEDIATELY: *FEVER GREATER THAN 100.4 F (38 C) OR HIGHER *CHILLS OR SWEATING *NAUSEA AND VOMITING THAT IS NOT CONTROLLED WITH YOUR NAUSEA MEDICATION *UNUSUAL SHORTNESS OF BREATH *UNUSUAL BRUISING OR BLEEDING *URINARY PROBLEMS (pain or burning when urinating, or frequent urination) *BOWEL PROBLEMS (unusual diarrhea, constipation, pain near the anus) TENDERNESS IN MOUTH AND THROAT WITH OR WITHOUT PRESENCE OF ULCERS (sore throat, sores in mouth, or a toothache) UNUSUAL RASH, SWELLING OR PAIN  UNUSUAL VAGINAL DISCHARGE OR ITCHING   Items with * indicate a potential emergency and should be followed up as soon as possible or go to the Emergency Department if any problems should occur.  Please show the CHEMOTHERAPY ALERT CARD or IMMUNOTHERAPY  ALERT CARD at check-in to the Emergency Department and triage nurse.  Should you have questions after your visit or need to cancel or reschedule your appointment, please contact [facility] - A DEPT OF [facility]  [phone] and follow the prompts.  Office hours are 8:00 a.m. to 4:30 p.m. Monday - Friday. Please note that voicemails left after 4:00 p.m. may not be returned until the following business day.  We are closed weekends and major holidays. You have access to a nurse at all times for urgent questions. Please call the main number to the clinic [phone] and follow the prompts.  For any non-urgent questions, you may also contact your provider using MyChart. We now offer e-Visits for anyone 59 and older to request care online for non-urgent symptoms. For details visit [email or web address].   Also download the MyChart app! Go to the app store, search "MyChart", open the app, select [place], and log in with your MyChart username and password.

## 2023-07-09 ENCOUNTER — Other Ambulatory Visit: Payer: Self-pay | Admitting: Oncology

## 2023-07-09 DIAGNOSIS — C3432 Malignant neoplasm of lower lobe, left bronchus or lung: Secondary | ICD-10-CM

## 2023-07-11 ENCOUNTER — Encounter: Payer: Self-pay | Admitting: Oncology

## 2023-07-12 ENCOUNTER — Inpatient Hospital Stay

## 2023-07-12 ENCOUNTER — Ambulatory Visit: Admitting: Oncology

## 2023-07-12 VITALS — BP 163/93 | HR 89 | Temp 97.0°F | Resp 18 | Wt 149.4 lb

## 2023-07-12 DIAGNOSIS — C3432 Malignant neoplasm of lower lobe, left bronchus or lung: Secondary | ICD-10-CM | POA: Diagnosis not present

## 2023-07-12 LAB — CMP (CANCER CENTER ONLY)
ALT: 13 U/L (ref 0–44)
AST: 20 U/L (ref 15–41)
Albumin: 3 g/dL — ABNORMAL LOW (ref 3.5–5.0)
Alkaline Phosphatase: 60 U/L (ref 38–126)
Anion gap: 7 (ref 5–15)
BUN: 11 mg/dL (ref 8–23)
CO2: 24 mmol/L (ref 22–32)
Calcium: 8.5 mg/dL — ABNORMAL LOW (ref 8.9–10.3)
Chloride: 105 mmol/L (ref 98–111)
Creatinine: 0.97 mg/dL (ref 0.61–1.24)
GFR, Estimated: >60 mL/min (ref >60)
Glucose, Bld: 105 mg/dL — ABNORMAL HIGH (ref 70–99)
Potassium: 3.2 mmol/L — ABNORMAL LOW (ref 3.5–5.1)
Sodium: 136 mmol/L (ref 135–145)
Total Bilirubin: 0.6 mg/dL (ref 0.0–1.2)
Total Protein: 7.5 g/dL (ref 6.5–8.1)

## 2023-07-12 LAB — CBC WITH DIFFERENTIAL (CANCER CENTER ONLY)
Abs Immature Granulocytes: 0.03 10*3/uL (ref 0.00–0.07)
Basophils Absolute: 0 10*3/uL (ref 0.0–0.1)
Basophils Relative: 1 %
Eosinophils Absolute: 0 10*3/uL (ref 0.0–0.5)
Eosinophils Relative: 0 %
HCT: 31.2 % — ABNORMAL LOW (ref 39.0–52.0)
Hemoglobin: 9.6 g/dL — ABNORMAL LOW (ref 13.0–17.0)
Immature Granulocytes: 1 %
Lymphocytes Relative: 22 %
Lymphs Abs: 0.8 10*3/uL (ref 0.7–4.0)
MCH: 24 pg — ABNORMAL LOW (ref 26.0–34.0)
MCHC: 30.8 g/dL (ref 30.0–36.0)
MCV: 78 fL — ABNORMAL LOW (ref 80.0–100.0)
Monocytes Absolute: 0.7 10*3/uL (ref 0.1–1.0)
Monocytes Relative: 20 %
Neutro Abs: 2.1 10*3/uL (ref 1.7–7.7)
Neutrophils Relative %: 56 %
Platelet Count: 478 10*3/uL — ABNORMAL HIGH (ref 150–400)
RBC: 4 MIL/uL — ABNORMAL LOW (ref 4.22–5.81)
RDW: 24 % — ABNORMAL HIGH (ref 11.5–15.5)
WBC Count: 3.7 10*3/uL — ABNORMAL LOW (ref 4.0–10.5)
nRBC: 0 % (ref 0.0–0.2)

## 2023-07-12 MED ORDER — SODIUM CHLORIDE 0.9 % IV SOLN
800.0000 mg/m2 | Freq: Once | INTRAVENOUS | Status: AC
  Administered 2023-07-12: 1482 mg via INTRAVENOUS
  Filled 2023-07-12: qty 38.98

## 2023-07-12 MED ORDER — HEPARIN SOD (PORK) LOCK FLUSH 100 UNIT/ML IV SOLN
500.0000 [IU] | Freq: Once | INTRAVENOUS | Status: AC | PRN
  Administered 2023-07-12: 500 [IU]
  Filled 2023-07-12: qty 5

## 2023-07-12 MED ORDER — SODIUM CHLORIDE 0.9 % IV SOLN
INTRAVENOUS | Status: DC
  Filled 2023-07-12: qty 250

## 2023-07-12 MED ORDER — PROCHLORPERAZINE MALEATE 10 MG PO TABS
10.0000 mg | ORAL_TABLET | Freq: Once | ORAL | Status: AC
  Administered 2023-07-12: 10 mg via ORAL
  Filled 2023-07-12: qty 1

## 2023-07-26 ENCOUNTER — Inpatient Hospital Stay: Attending: Oncology

## 2023-07-26 ENCOUNTER — Inpatient Hospital Stay

## 2023-07-26 ENCOUNTER — Inpatient Hospital Stay (HOSPITAL_BASED_OUTPATIENT_CLINIC_OR_DEPARTMENT_OTHER): Admitting: Oncology

## 2023-07-26 ENCOUNTER — Encounter: Payer: Self-pay | Admitting: Oncology

## 2023-07-26 VITALS — BP 141/92 | HR 94 | Temp 97.5°F | Resp 17 | Wt 150.0 lb

## 2023-07-26 DIAGNOSIS — C782 Secondary malignant neoplasm of pleura: Secondary | ICD-10-CM | POA: Diagnosis not present

## 2023-07-26 DIAGNOSIS — T451X5A Adverse effect of antineoplastic and immunosuppressive drugs, initial encounter: Secondary | ICD-10-CM | POA: Insufficient documentation

## 2023-07-26 DIAGNOSIS — G893 Neoplasm related pain (acute) (chronic): Secondary | ICD-10-CM | POA: Insufficient documentation

## 2023-07-26 DIAGNOSIS — Z5111 Encounter for antineoplastic chemotherapy: Secondary | ICD-10-CM | POA: Insufficient documentation

## 2023-07-26 DIAGNOSIS — I3139 Other pericardial effusion (noninflammatory): Secondary | ICD-10-CM | POA: Diagnosis not present

## 2023-07-26 DIAGNOSIS — C3432 Malignant neoplasm of lower lobe, left bronchus or lung: Secondary | ICD-10-CM | POA: Diagnosis present

## 2023-07-26 DIAGNOSIS — D6481 Anemia due to antineoplastic chemotherapy: Secondary | ICD-10-CM

## 2023-07-26 DIAGNOSIS — Z79891 Long term (current) use of opiate analgesic: Secondary | ICD-10-CM | POA: Insufficient documentation

## 2023-07-26 DIAGNOSIS — N179 Acute kidney failure, unspecified: Secondary | ICD-10-CM | POA: Insufficient documentation

## 2023-07-26 DIAGNOSIS — Z87891 Personal history of nicotine dependence: Secondary | ICD-10-CM | POA: Diagnosis not present

## 2023-07-26 DIAGNOSIS — R0602 Shortness of breath: Secondary | ICD-10-CM | POA: Diagnosis not present

## 2023-07-26 DIAGNOSIS — Z79899 Other long term (current) drug therapy: Secondary | ICD-10-CM | POA: Insufficient documentation

## 2023-07-26 DIAGNOSIS — J90 Pleural effusion, not elsewhere classified: Secondary | ICD-10-CM | POA: Insufficient documentation

## 2023-07-26 DIAGNOSIS — D75839 Thrombocytosis, unspecified: Secondary | ICD-10-CM | POA: Insufficient documentation

## 2023-07-26 LAB — CMP (CANCER CENTER ONLY)
ALT: 11 U/L (ref 0–44)
AST: 16 U/L (ref 15–41)
Albumin: 2.8 g/dL — ABNORMAL LOW (ref 3.5–5.0)
Alkaline Phosphatase: 58 U/L (ref 38–126)
Anion gap: 6 (ref 5–15)
BUN: 13 mg/dL (ref 8–23)
CO2: 24 mmol/L (ref 22–32)
Calcium: 8.3 mg/dL — ABNORMAL LOW (ref 8.9–10.3)
Chloride: 106 mmol/L (ref 98–111)
Creatinine: 0.97 mg/dL (ref 0.61–1.24)
GFR, Estimated: >60 mL/min (ref >60)
Glucose, Bld: 92 mg/dL (ref 70–99)
Potassium: 3.7 mmol/L (ref 3.5–5.1)
Sodium: 136 mmol/L (ref 135–145)
Total Bilirubin: 0.6 mg/dL (ref 0.0–1.2)
Total Protein: 7.1 g/dL (ref 6.5–8.1)

## 2023-07-26 LAB — CBC WITH DIFFERENTIAL (CANCER CENTER ONLY)
Abs Immature Granulocytes: 0.03 10*3/uL (ref 0.00–0.07)
Basophils Absolute: 0 10*3/uL (ref 0.0–0.1)
Basophils Relative: 0 %
Eosinophils Absolute: 0 10*3/uL (ref 0.0–0.5)
Eosinophils Relative: 0 %
HCT: 30.9 % — ABNORMAL LOW (ref 39.0–52.0)
Hemoglobin: 9.4 g/dL — ABNORMAL LOW (ref 13.0–17.0)
Immature Granulocytes: 0 %
Lymphocytes Relative: 11 %
Lymphs Abs: 0.8 10*3/uL (ref 0.7–4.0)
MCH: 24.6 pg — ABNORMAL LOW (ref 26.0–34.0)
MCHC: 30.4 g/dL (ref 30.0–36.0)
MCV: 80.9 fL (ref 80.0–100.0)
Monocytes Absolute: 0.9 10*3/uL (ref 0.1–1.0)
Monocytes Relative: 12 %
Neutro Abs: 5.7 10*3/uL (ref 1.7–7.7)
Neutrophils Relative %: 77 %
Platelet Count: 517 10*3/uL — ABNORMAL HIGH (ref 150–400)
RBC: 3.82 MIL/uL — ABNORMAL LOW (ref 4.22–5.81)
RDW: 24.3 % — ABNORMAL HIGH (ref 11.5–15.5)
WBC Count: 7.5 10*3/uL (ref 4.0–10.5)
nRBC: 0 % (ref 0.0–0.2)

## 2023-07-26 MED ORDER — SODIUM CHLORIDE 0.9 % IV SOLN
800.0000 mg/m2 | Freq: Once | INTRAVENOUS | Status: AC
  Administered 2023-07-26: 1482 mg via INTRAVENOUS
  Filled 2023-07-26: qty 38.98

## 2023-07-26 MED ORDER — PROCHLORPERAZINE MALEATE 10 MG PO TABS
10.0000 mg | ORAL_TABLET | Freq: Once | ORAL | Status: AC
  Administered 2023-07-26: 10 mg via ORAL
  Filled 2023-07-26: qty 1

## 2023-07-26 MED ORDER — HEPARIN SOD (PORK) LOCK FLUSH 100 UNIT/ML IV SOLN
500.0000 [IU] | Freq: Once | INTRAVENOUS | Status: AC | PRN
  Administered 2023-07-26: 500 [IU]
  Filled 2023-07-26: qty 5

## 2023-07-26 MED ORDER — SODIUM CHLORIDE 0.9 % IV SOLN
INTRAVENOUS | Status: DC
  Filled 2023-07-26: qty 250

## 2023-07-26 NOTE — Progress Notes (Signed)
 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  No evidence of distant metastatic disease.  Pathology was consistent with non-small cell lung cancer favor adenocarcinoma.  Cells were positive for TTF-1 and negative for p40.   He received 1 cycle of CarboTaxol and then had significant reaction to Taxol .  He was switched to Abraxane  for 3 more cycles but due to shortage of Abraxane  he was switched to carbo Alimta  for 2 cycles ending in October 2021 concurrent with radiation.  Subsequently he was started on maintenance durvalumab  which she continued until September 2022.     CT scans in October 2022 showed concern for hilar recurrence and patient was retreated with carbo Alimta  followed by maintenance Alimta  which she received until December 2023.  He has been off treatment since then due to stable disease but Alimta  was also complicated by worsening anemia and AKI.   Skains while he was on Alimta  showed mild to moderate left pleural effusion.  He has had multiple thoracentesis in the past which were all negative for malignancy. PET scan in October 2024  did not show any evidence of recurrent or progressive disease elsewhere but showed large left pleural effusion with complete left lower lobe collapse.  Also noted to have small to moderate pericardial effusion on his CT scans.    He was admitted to [facility] and underwentVATS procedure with decortication and drainage of pleural and pericardial effusion.  Cytology from thoracentesis as well as pericardial fluid was negative for malignancy.  Pericardial biopsy showed nonspecific chronic inflammation.  However his visceral pleural biopsy showed scant foci of non-small cell carcinoma only within lymphovascular space.   NGS testing back in 2021 showed no evidence of actionable mutations.  Tumor mutational burden 0.  MSI stable PD-L1 10%   He was noted to have moderate to large left pleural effusions in multiple locations along with small to moderate pericardial effusion for which she underwent thoracentesis in the past and all of them have been negative for malignancy. He was seen by CT surgery in March 2025 and underwent VATS procedure along with decortication. Pleural and pericardial fluid was again negative for malignancy. Pericardial biopsy showed no malignancy but there were foci of adenocarcinoma within the lymphovascular space noted in the visceral pleura.  He was started on third line palliative gemcitabine .  Docetaxel could not be attempted given his prior reaction to Taxol     Interval history-tolerating chemotherapy well without any significant nausea or vomiting.  He still has left chest wall pain for which he takes occasional oxycodone  but needs to take it to a time to control his pain.  He has been using 2 doses per day.  He has baseline exertional shortness of breath which  remains unchanged.  ECOG PS- 1 Pain scale- 3 Opioid associated constipation- no  Review of systems- Review of Systems  Constitutional:  Positive for malaise/fatigue. Negative for chills, fever and weight loss.  HENT:   Negative for congestion, ear discharge and nosebleeds.   Eyes:  Negative for blurred vision.  Respiratory:  Positive for shortness of breath. Negative for cough, hemoptysis, sputum production and wheezing.   Cardiovascular:  Negative for chest pain, palpitations, orthopnea and claudication.  Gastrointestinal:  Negative for abdominal pain, blood in stool, constipation, diarrhea, heartburn, melena, nausea and vomiting.  Genitourinary:  Negative for dysuria, flank pain, frequency, hematuria and urgency.  Musculoskeletal:  Negative for back pain, joint pain and myalgias.  Skin:  Negative for rash.  Neurological:  Negative for dizziness, tingling, focal weakness, seizures, weakness and headaches.  Endo/Heme/Allergies:  Does not bruise/bleed easily.  Psychiatric/Behavioral:  Negative for depression and suicidal ideas. The patient does not have insomnia.       Allergies  Allergen Reactions   Taxol  [Paclitaxel ] Other (See Comments)    Chest pain, hip pain, coughing , wheezing     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 [facility])      Past Surgical History:  Procedure Laterality Date   CHEST TUBE INSERTION Right 04/25/2023   Procedure: RIGHT PLEURAL CHEST TUBE INSERTION;  Surgeon: Zelphia Higashi, MD;  Location: [facility] OR;  Service: Thoracic;  Laterality: Right;   ESOPHAGOGASTRODUODENOSCOPY (EGD) WITH PROPOFOL  N/A 12/01/2020   Procedure: ESOPHAGOGASTRODUODENOSCOPY (EGD) WITH PROPOFOL ;  Surgeon: Selena Daily, MD;  Location: [facility] ENDOSCOPY;  Service: Gastroenterology;  Laterality: N/A;   IR THORACENTESIS ASP PLEURAL SPACE W/IMG GUIDE  12/26/2022   PERICARDIAL FLUID DRAINAGE N/A 04/25/2023   Procedure: DRAINAGE, PERICARDIAL EFFUSION;  Surgeon: Zelphia Higashi, MD;  Location: [facility] OR;  Service: Thoracic;  Laterality: N/A;   PERICARDIAL WINDOW N/A 04/25/2023   Procedure: CREATION,  PERICARDIAL WINDOW;  Surgeon: Zelphia Higashi, MD;  Location: [facility] OR;  Service: Thoracic;  Laterality: N/A;   PLEURAL EFFUSION DRAINAGE Bilateral 04/25/2023   Procedure: DRAINAGE, PLEURAL EFFUSION;  Surgeon: Zelphia Higashi, MD;  Location: [facility] OR;  Service: Thoracic;  Laterality: Bilateral;   PORTA CATH INSERTION N/A 10/22/2019   Procedure: PORTA CATH INSERTION;  Surgeon: Jackquelyn Mass, MD;  Location: [facility] INVASIVE CV LAB;  Service: Cardiovascular;  Laterality: N/A;   VIDEO ASSISTED THORACOSCOPY (VATS)/DECORTICATION Left 04/25/2023   Procedure: VIDEO ASSISTED THORACOSCOPY (VATS)/DECORTICATION;  Surgeon: Zelphia Higashi, MD;  Location: [facility] OR;  Service: Thoracic;  Laterality: Left;   VIDEO BRONCHOSCOPY WITH ENDOBRONCHIAL ULTRASOUND N/A 09/25/2019   Procedure: VIDEO BRONCHOSCOPY WITH ENDOBRONCHIAL ULTRASOUND;  Surgeon: Marc Senior, MD;  Location: [facility] ORS;  Service: Pulmonary;  Laterality: N/A;    Social History   Socioeconomic History   Marital status: Single    Spouse name: Not on file   Number of children: Not on file   Years of education: Not on file   Highest education level: Not on file  Occupational History   Not on file  Tobacco Use   Smoking status: Former    Current packs/day: 0.00    Average packs/day: 1 pack/day for 20.0 years (20.0 ttl pk-yrs)    Types: Cigarettes    Start date: 68    Quit date: 81    Years since quitting: 31.4   Smokeless tobacco: Never  Vaping Use   Vaping status: Never Used  Substance and Sexual Activity   Alcohol use: Not Currently    Comment: not drank any beer in 2 onths   Drug use: Never   Sexual activity: Not Currently  Other Topics Concern   Not on file  Social History Narrative   Not on file   Social Drivers of Health   Financial Resource Strain: Not on file  Food Insecurity: No Food Insecurity (04/23/2023)   Hunger Vital Sign    Worried About Running Out of Food in the Last Year: Never true    Ran Out of  Food in the Last Year: Never true  Transportation Needs: No Transportation Needs (04/23/2023)   PRAPARE - Administrator, Civil Service (Medical): No    Lack of Transportation (Non-Medical): No  Physical Activity: Not on file  Stress: Not on file  Social Connections: Socially Isolated (04/23/2023)   Social Connection and Isolation Panel [NHANES]    Frequency of Communication with Friends and Family: More than three times a week    Frequency of Social Gatherings with Friends and Family: Twice a week    Attends Religious Services: Never    Database administrator or Organizations: No    Attends Banker Meetings: Never    Marital Status: Never married  Intimate Partner Violence: Not At Risk (04/23/2023)   Humiliation, Afraid, Rape, and Kick questionnaire    Fear of Current or Ex-Partner: No    Emotionally Abused: No    Physically Abused: No    Sexually Abused: No    Family History  Problem Relation Age of Onset   Cancer Brother      Current Outpatient Medications:    acetaminophen  (TYLENOL ) 500 MG tablet, Take 500 mg by mouth every 6 (six) hours as needed for mild pain (pain score 1-3) (takes as needed for generalized pain.  Denies pain at this time.)., Disp: , Rfl:    albuterol  (VENTOLIN  HFA) 108 (90 Base) MCG/ACT inhaler, INHALE 2 PUFFS INTO THE LUNGS EVERY 4 HOURS AS NEEDED FOR WHEEZING OR SHORTNESS OF BREATH, Disp: 6.7 g, Rfl: 2   ANORO ELLIPTA  62.5-25 MCG/ACT AEPB, INHALE 1 PUFF INTO THE LUNGS DAILY, Disp: 60 each, Rfl: 2   lidocaine -prilocaine  (EMLA ) cream, Apply to affected area once, Disp: 30 g, Rfl: 3   ondansetron  (ZOFRAN ) 8 MG tablet, Take 1 tablet (8 mg total) by mouth every 8 (eight) hours as needed for nausea or vomiting., Disp: 30 tablet, Rfl: 1   oxyCODONE  (OXY IR/ROXICODONE ) 5 MG immediate release tablet, Take 5 mg by mouth 3 (three) times daily as needed., Disp: , Rfl:    pantoprazole  (PROTONIX ) 20 MG tablet, Take 1 tablet (20 mg total) by mouth  every morning., Disp: 30 tablet, Rfl: 2   potassium chloride  SA (KLOR-CON  M) 20 MEQ tablet, TAKE 1 TABLET(20 MEQ) BY MOUTH DAILY, Disp: 7 tablet, Rfl: 0   prochlorperazine  (COMPAZINE ) 10 MG tablet, Take 1 tablet (10 mg total) by mouth every 6 (six) hours as needed for nausea or vomiting., Disp: 30 tablet, Rfl: 1   umeclidinium-vilanterol (ANORO ELLIPTA ) 62.5-25 MCG/ACT AEPB, Inhale 1 puff into the lungs daily., Disp: 1 each, Rfl: 3 No current facility-administered medications for this visit.  Facility-Administered Medications Ordered in Other Visits:    0.9 %  sodium chloride  infusion, , Intravenous, Continuous, Avonne Boettcher, MD, Stopped at 07/26/23 1344  Physical exam:  Vitals:   07/26/23 1128  BP: (!) 141/92  Pulse: 94  Resp: 17  Temp: (!) 97.5 F (36.4 C)  TempSrc: Tympanic  SpO2: 97%  Weight: 150 lb (68 kg)   Physical Exam Cardiovascular:     Rate and Rhythm: Normal rate and regular rhythm.     Heart sounds: Normal heart sounds.  Pulmonary:     Effort: Pulmonary effort is normal.     Comments: Breath sounds decreased over left lung base Abdominal:     General: Bowel sounds are normal.     Palpations: Abdomen is soft.  Skin:    General: Skin is warm and dry.  Neurological:     Mental Status: He is alert and oriented to person, place, and time.      I have personally reviewed labs listed below:    Latest Ref Rng & Units 07/26/2023   11:14 AM  CMP  Glucose 70 - 99 mg/dL 92   BUN 8 - 23 mg/dL 13   Creatinine 1.61 - 1.24 mg/dL 0.96   Sodium 045 - 409 mmol/L 136   Potassium 3.5 - 5.1 mmol/L 3.7   Chloride 98 - 111 mmol/L 106   CO2 22 - 32 mmol/L 24   Calcium 8.9 - 10.3 mg/dL 8.3   Total Protein 6.5 - 8.1 g/dL 7.1   Total Bilirubin 0.0 - 1.2 mg/dL 0.6   Alkaline Phos 38 - 126 U/L 58   AST 15 - 41 U/L 16   ALT 0 - 44 U/L 11       Latest Ref Rng & Units 07/26/2023   11:14 AM  CBC  WBC 4.0 - 10.5 K/uL 7.5   Hemoglobin 13.0 - 17.0 g/dL 9.4   Hematocrit 81.1 -  52.0 % 30.9   Platelets 150 - 400 K/uL 517    I have personally reviewed Radiology images listed below: No images are attached to the encounter.  DG Chest 2 View Result Date: 06/27/2023 CLINICAL DATA:  Pleural effusion. Malignant neoplasm of left lower lobe. EXAM: CHEST - 2 VIEW COMPARISON:  May 16, 2023. FINDINGS: Stable cardiomediastinal silhouette. Right internal jugular Port-A-Cath is unchanged. Minimal right basilar subsegmental atelectasis is noted with small right pleural effusion. Probable left basilar hydropneumothorax is noted. IMPRESSION: Probable left basilar hydropneumothorax. CT scan is recommended for further evaluation. Electronically Signed   By: Rosalene Colon M.D.   On: 06/27/2023 12:42     Assessment and plan- Patient is a 73 y.o. male with recurrent adenocarcinoma of the lung here for on treatment assessment prior to cycle 4-day 1 of gemcitabine  chemotherapy  Counts okay to proceed with cycle 4-day 1 of gemcitabine  chemotherapy today.  I will directly proceed for cycle 4-day 8 of treatment next week and I will see him back in 3 weeks for cycle 5-day 1 of treatment.  I will plan to get CT chest abdomen and pelvis with contrast in 2 weeks time.  Chemo induced anemia: Stable continue to monitor  Thrombocytosis likely reactive continue to monitor  Neoplasm related pain I have renewed his oxycodone  prescription today   Visit Diagnosis 1. 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)   2. Encounter for antineoplastic chemotherapy   3. Neoplasm related pain      Dr. Seretha Dance, MD, MPH [facility] [phone] 07/26/2023 3:58 PM

## 2023-07-26 NOTE — Patient Instructions (Signed)
 [facility] - A DEPT OF [facility]  Discharge Instructions: Thank you for choosing [facility] to provide your oncology and hematology care.  If you have a lab appointment with the Cancer Center, please go directly to the Cancer Center and check in at the registration area.  Wear comfortable clothing and clothing appropriate for easy access to any Portacath or PICC line.   We strive to give you quality time with your provider. You may need to reschedule your appointment if you arrive late (15 or more minutes).  Arriving late affects you and other patients whose appointments are after yours.  Also, if you miss three or more appointments without notifying the office, you may be dismissed from the clinic at the provider's discretion.      For prescription refill requests, have your pharmacy contact our office and allow 72 hours for refills to be completed.    Today you received the following chemotherapy and/or immunotherapy agents GEMZAR      To help prevent nausea and vomiting after your treatment, we encourage you to take your nausea medication as directed.  BELOW ARE SYMPTOMS THAT SHOULD BE REPORTED IMMEDIATELY: *FEVER GREATER THAN 100.4 F (38 C) OR HIGHER *CHILLS OR SWEATING *NAUSEA AND VOMITING THAT IS NOT CONTROLLED WITH YOUR NAUSEA MEDICATION *UNUSUAL SHORTNESS OF BREATH *UNUSUAL BRUISING OR BLEEDING *URINARY PROBLEMS (pain or burning when urinating, or frequent urination) *BOWEL PROBLEMS (unusual diarrhea, constipation, pain near the anus) TENDERNESS IN MOUTH AND THROAT WITH OR WITHOUT PRESENCE OF ULCERS (sore throat, sores in mouth, or a toothache) UNUSUAL RASH, SWELLING OR PAIN  UNUSUAL VAGINAL DISCHARGE OR ITCHING   Items with * indicate a potential emergency and should be followed up as soon as possible or go to the Emergency Department if any problems should occur.  Please show the CHEMOTHERAPY ALERT CARD or IMMUNOTHERAPY ALERT  CARD at check-in to the Emergency Department and triage nurse.  Should you have questions after your visit or need to cancel or reschedule your appointment, please contact [facility] - A DEPT OF [facility]  [phone] and follow the prompts.  Office hours are 8:00 a.m. to 4:30 p.m. Monday - Friday. Please note that voicemails left after 4:00 p.m. may not be returned until the following business day.  We are closed weekends and major holidays. You have access to a nurse at all times for urgent questions. Please call the main number to the clinic [phone] and follow the prompts.  For any non-urgent questions, you may also contact your provider using MyChart. We now offer e-Visits for anyone 58 and older to request care online for non-urgent symptoms. For details visit [email or web address].   Also download the MyChart app! Go to the app store, search "MyChart", open the app, select [place], and log in with your MyChart username and password.  Gemcitabine Injection What is this medication? GEMCITABINE (jem SYE ta been) treats some types of cancer. It works by slowing down the growth of cancer cells. This medicine may be used for other purposes; ask your health care provider or pharmacist if you have questions. COMMON BRAND NAME(S): Gemzar, Infugem What should I tell my care team before I take this medication? They need to know if you have any of these conditions: Blood disorders Infection Kidney disease Liver disease Lung or breathing disease, such as asthma or COPD Recent or ongoing radiation therapy An unusual or allergic  reaction to gemcitabine, other medications, foods, dyes, or preservatives If you or your partner are pregnant or trying to get pregnant Breast-feeding How should I use this medication? This medication is injected into a vein. It is given by your care team in a hospital or clinic setting. Talk to your care team about the use of  this medication in children. Special care may be needed. Overdosage: If you think you have taken too much of this medicine contact a poison control center or emergency room at once. NOTE: This medicine is only for you. Do not share this medicine with others. What if I miss a dose? Keep appointments for follow-up doses. It is important not to miss your dose. Call your care team if you are unable to keep an appointment. What may interact with this medication? Interactions have not been studied. This list may not describe all possible interactions. Give your health care provider a list of all the medicines, herbs, non-prescription drugs, or dietary supplements you use. Also tell them if you smoke, drink alcohol, or use illegal drugs. Some items may interact with your medicine. What should I watch for while using this medication? Your condition will be monitored carefully while you are receiving this medication. This medication may make you feel generally unwell. This is not uncommon, as chemotherapy can affect healthy cells as well as cancer cells. Report any side effects. Continue your course of treatment even though you feel ill unless your care team tells you to stop. In some cases, you may be given additional medications to help with side effects. Follow all directions for their use. This medication may increase your risk of getting an infection. Call your care team for advice if you get a fever, chills, sore throat, or other symptoms of a cold or flu. Do not treat yourself. Try to avoid being around people who are sick. This medication may increase your risk to bruise or bleed. Call your care team if you notice any unusual bleeding. Be careful brushing or flossing your teeth or using a toothpick because you may get an infection or bleed more easily. If you have any dental work done, tell your dentist you are receiving this medication. Avoid taking medications that contain aspirin, acetaminophen,  ibuprofen, naproxen, or ketoprofen unless instructed by your care team. These medications may hide a fever. Talk to your care team if you or your partner wish to become pregnant or think you might be pregnant. This medication can cause serious birth defects if taken during pregnancy and for 6 months after the last dose. A negative pregnancy test is required before starting this medication. A reliable form of contraception is recommended while taking this medication and for 6 months after the last dose. Talk to your care team about effective forms of contraception. Do not father a child while taking this medication and for 3 months after the last dose. Use a condom while having sex during this time period. Do not breastfeed while taking this medication and for at least 1 week after the last dose. This medication may cause infertility. Talk to your care team if you are concerned about your fertility. What side effects may I notice from receiving this medication? Side effects that you should report to your care team as soon as possible: Allergic reactions--skin rash, itching, hives, swelling of the face, lips, tongue, or throat Capillary leak syndrome--stomach or muscle pain, unusual weakness or fatigue, feeling faint or lightheaded, decrease in the amount of urine, swelling of  the ankles, hands, or feet, trouble breathing Infection--fever, chills, cough, sore throat, wounds that don't heal, pain or trouble when passing urine, general feeling of discomfort or being unwell Liver injury--right upper belly pain, loss of appetite, nausea, light-colored stool, dark yellow or brown urine, yellowing skin or eyes, unusual weakness or fatigue Low red blood cell level--unusual weakness or fatigue, dizziness, headache, trouble breathing Lung injury--shortness of breath or trouble breathing, cough, spitting up blood, chest pain, fever Stomach pain, bloody diarrhea, pale skin, unusual weakness or fatigue, decrease in  the amount of urine, which may be signs of hemolytic uremic syndrome Sudden and severe headache, confusion, change in vision, seizures, which may be signs of posterior reversible encephalopathy syndrome (PRES) Unusual bruising or bleeding Side effects that usually do not require medical attention (report to your care team if they continue or are bothersome): Diarrhea Drowsiness Hair loss Nausea Pain, redness, or swelling with sores inside the mouth or throat Vomiting This list may not describe all possible side effects. Call your doctor for medical advice about side effects. You may report side effects to FDA at 1-800-FDA-1088. Where should I keep my medication? This medication is given in a hospital or clinic. It will not be stored at home. NOTE: This sheet is a summary. It may not cover all possible information. If you have questions about this medicine, talk to your doctor, pharmacist, or health care provider.  2024 Elsevier/Gold Standard (2021-06-08 00:00:00)

## 2023-08-02 ENCOUNTER — Inpatient Hospital Stay

## 2023-08-02 VITALS — BP 119/80 | HR 99 | Temp 97.6°F | Resp 18

## 2023-08-02 DIAGNOSIS — C3432 Malignant neoplasm of lower lobe, left bronchus or lung: Secondary | ICD-10-CM

## 2023-08-02 DIAGNOSIS — Z5111 Encounter for antineoplastic chemotherapy: Secondary | ICD-10-CM | POA: Diagnosis not present

## 2023-08-02 LAB — CBC WITH DIFFERENTIAL (CANCER CENTER ONLY)
Abs Immature Granulocytes: 0.03 10*3/uL (ref 0.00–0.07)
Basophils Absolute: 0 10*3/uL (ref 0.0–0.1)
Basophils Relative: 1 %
Eosinophils Absolute: 0 10*3/uL (ref 0.0–0.5)
Eosinophils Relative: 0 %
HCT: 32.4 % — ABNORMAL LOW (ref 39.0–52.0)
Hemoglobin: 9.9 g/dL — ABNORMAL LOW (ref 13.0–17.0)
Immature Granulocytes: 1 %
Lymphocytes Relative: 20 %
Lymphs Abs: 0.8 10*3/uL (ref 0.7–4.0)
MCH: 24.6 pg — ABNORMAL LOW (ref 26.0–34.0)
MCHC: 30.6 g/dL (ref 30.0–36.0)
MCV: 80.6 fL (ref 80.0–100.0)
Monocytes Absolute: 0.7 10*3/uL (ref 0.1–1.0)
Monocytes Relative: 19 %
Neutro Abs: 2.3 10*3/uL (ref 1.7–7.7)
Neutrophils Relative %: 59 %
Platelet Count: 445 10*3/uL — ABNORMAL HIGH (ref 150–400)
RBC: 4.02 MIL/uL — ABNORMAL LOW (ref 4.22–5.81)
RDW: 23.7 % — ABNORMAL HIGH (ref 11.5–15.5)
WBC Count: 3.8 10*3/uL — ABNORMAL LOW (ref 4.0–10.5)
nRBC: 0.8 % — ABNORMAL HIGH (ref 0.0–0.2)

## 2023-08-02 LAB — CMP (CANCER CENTER ONLY)
ALT: 13 U/L (ref 0–44)
AST: 19 U/L (ref 15–41)
Albumin: 3 g/dL — ABNORMAL LOW (ref 3.5–5.0)
Alkaline Phosphatase: 59 U/L (ref 38–126)
Anion gap: 8 (ref 5–15)
BUN: 15 mg/dL (ref 8–23)
CO2: 23 mmol/L (ref 22–32)
Calcium: 9 mg/dL (ref 8.9–10.3)
Chloride: 105 mmol/L (ref 98–111)
Creatinine: 1.11 mg/dL (ref 0.61–1.24)
GFR, Estimated: >60 mL/min (ref >60)
Glucose, Bld: 129 mg/dL — ABNORMAL HIGH (ref 70–99)
Potassium: 3.6 mmol/L (ref 3.5–5.1)
Sodium: 136 mmol/L (ref 135–145)
Total Bilirubin: 1 mg/dL (ref 0.0–1.2)
Total Protein: 7.7 g/dL (ref 6.5–8.1)

## 2023-08-02 MED ORDER — SODIUM CHLORIDE 0.9% FLUSH
10.0000 mL | INTRAVENOUS | Status: DC | PRN
  Administered 2023-08-02: 10 mL
  Filled 2023-08-02: qty 10

## 2023-08-02 MED ORDER — SODIUM CHLORIDE 0.9 % IV SOLN
INTRAVENOUS | Status: DC
  Filled 2023-08-02: qty 250

## 2023-08-02 MED ORDER — SODIUM CHLORIDE 0.9 % IV SOLN
800.0000 mg/m2 | Freq: Once | INTRAVENOUS | Status: AC
  Administered 2023-08-02: 1482 mg via INTRAVENOUS
  Filled 2023-08-02: qty 38.98

## 2023-08-02 MED ORDER — PROCHLORPERAZINE MALEATE 10 MG PO TABS
10.0000 mg | ORAL_TABLET | Freq: Once | ORAL | Status: AC
  Administered 2023-08-02: 10 mg via ORAL
  Filled 2023-08-02: qty 1

## 2023-08-02 MED ORDER — HEPARIN SOD (PORK) LOCK FLUSH 100 UNIT/ML IV SOLN
500.0000 [IU] | Freq: Once | INTRAVENOUS | Status: AC | PRN
  Administered 2023-08-02: 500 [IU]
  Filled 2023-08-02: qty 5

## 2023-08-02 NOTE — Patient Instructions (Signed)
 [facility] - A DEPT OF [facility]  Discharge Instructions: Thank you for choosing [facility] to provide your oncology and hematology care.  If you have a lab appointment with the Cancer Center, please go directly to the Cancer Center and check in at the registration area.  Wear comfortable clothing and clothing appropriate for easy access to any Portacath or PICC line.   We strive to give you quality time with your provider. You may need to reschedule your appointment if you arrive late (15 or more minutes).  Arriving late affects you and other patients whose appointments are after yours.  Also, if you miss three or more appointments without notifying the office, you may be dismissed from the clinic at the provider's discretion.      For prescription refill requests, have your pharmacy contact our office and allow 72 hours for refills to be completed.    Today you received the following chemotherapy and/or immunotherapy agents- gemzar      To help prevent nausea and vomiting after your treatment, we encourage you to take your nausea medication as directed.  BELOW ARE SYMPTOMS THAT SHOULD BE REPORTED IMMEDIATELY: *FEVER GREATER THAN 100.4 F (38 C) OR HIGHER *CHILLS OR SWEATING *NAUSEA AND VOMITING THAT IS NOT CONTROLLED WITH YOUR NAUSEA MEDICATION *UNUSUAL SHORTNESS OF BREATH *UNUSUAL BRUISING OR BLEEDING *URINARY PROBLEMS (pain or burning when urinating, or frequent urination) *BOWEL PROBLEMS (unusual diarrhea, constipation, pain near the anus) TENDERNESS IN MOUTH AND THROAT WITH OR WITHOUT PRESENCE OF ULCERS (sore throat, sores in mouth, or a toothache) UNUSUAL RASH, SWELLING OR PAIN  UNUSUAL VAGINAL DISCHARGE OR ITCHING   Items with * indicate a potential emergency and should be followed up as soon as possible or go to the Emergency Department if any problems should occur.  Please show the CHEMOTHERAPY ALERT CARD or IMMUNOTHERAPY  ALERT CARD at check-in to the Emergency Department and triage nurse.  Should you have questions after your visit or need to cancel or reschedule your appointment, please contact [facility] - A DEPT OF [facility]  [phone] and follow the prompts.  Office hours are 8:00 a.m. to 4:30 p.m. Monday - Friday. Please note that voicemails left after 4:00 p.m. may not be returned until the following business day.  We are closed weekends and major holidays. You have access to a nurse at all times for urgent questions. Please call the main number to the clinic [phone] and follow the prompts.  For any non-urgent questions, you may also contact your provider using MyChart. We now offer e-Visits for anyone 59 and older to request care online for non-urgent symptoms. For details visit [email or web address].   Also download the MyChart app! Go to the app store, search "MyChart", open the app, select [place], and log in with your MyChart username and password.

## 2023-08-04 ENCOUNTER — Other Ambulatory Visit: Payer: Self-pay | Admitting: Thoracic Surgery (Cardiothoracic Vascular Surgery)

## 2023-08-04 DIAGNOSIS — C3432 Malignant neoplasm of lower lobe, left bronchus or lung: Secondary | ICD-10-CM

## 2023-08-08 ENCOUNTER — Ambulatory Visit (HOSPITAL_COMMUNITY)
Admission: RE | Admit: 2023-08-08 | Discharge: 2023-08-08 | Disposition: A | Discharge status: Home / Self Care | Source: Ambulatory Visit | Attending: Internal Medicine | Admitting: Internal Medicine

## 2023-08-08 ENCOUNTER — Encounter: Payer: Self-pay | Admitting: Thoracic Surgery (Cardiothoracic Vascular Surgery)

## 2023-08-08 ENCOUNTER — Ambulatory Visit
Attending: Thoracic Surgery (Cardiothoracic Vascular Surgery) | Admitting: Thoracic Surgery (Cardiothoracic Vascular Surgery)

## 2023-08-08 VITALS — BP 136/78 | HR 94 | Resp 20 | Ht 70.0 in | Wt 148.0 lb

## 2023-08-08 DIAGNOSIS — J948 Other specified pleural conditions: Secondary | ICD-10-CM | POA: Diagnosis not present

## 2023-08-08 DIAGNOSIS — J90 Pleural effusion, not elsewhere classified: Secondary | ICD-10-CM | POA: Diagnosis not present

## 2023-08-08 DIAGNOSIS — C3432 Malignant neoplasm of lower lobe, left bronchus or lung: Secondary | ICD-10-CM | POA: Insufficient documentation

## 2023-08-08 NOTE — Progress Notes (Signed)
 [address] [place] [address]             [phone]     HPI: Mr. Waiters returns for scheduled follow-up regarding his pleural effusion.  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  Diagnosed in 2021.  Had chemoradiation and durvalumab .  Developed a recurrence.  He then developed recurrent pleural effusions.  He presented in March with worsening shortness of breath.  He had bilateral pleural effusions and a large pericardial effusion.  He underwent left VATS for drainage of pleural effusion, partial decortication and a pericardial window on 04/25/2023.  Postoperative course was remarkable for delirium but he went home on day 7.  His lung was trapped and did not completely reexpand.  Last saw him in the office in May.  He has had 4 cycles of gemcitabine .  He says he has a chronic unusual sensation related to his surgery.  Not really pain, just feels funny.  Breathing has been unchanged.  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 (HCC)     Current Outpatient Medications  Medication Sig Dispense Refill   acetaminophen  (TYLENOL ) 500 MG tablet Take 500 mg by mouth every 6 (six) hours as needed for mild pain (pain score 1-3) (takes as needed for generalized pain.  Denies pain at this time.).     albuterol  (VENTOLIN  HFA) 108 (90 Base) MCG/ACT inhaler INHALE 2 PUFFS INTO THE LUNGS EVERY 4 HOURS AS NEEDED FOR WHEEZING OR SHORTNESS OF BREATH 6.7 g 2   ANORO ELLIPTA  62.5-25 MCG/ACT AEPB INHALE 1 PUFF INTO THE LUNGS DAILY 60 each 2   lidocaine -prilocaine  (EMLA ) cream Apply to affected area once 30 g 3   ondansetron  (ZOFRAN ) 8 MG tablet Take 1 tablet (8 mg total) by mouth every 8 (eight) hours as needed for nausea or vomiting. 30 tablet 1   oxyCODONE  (OXY IR/ROXICODONE ) 5 MG immediate release tablet  Take 5 mg by mouth 3 (three) times daily as needed.     pantoprazole  (PROTONIX ) 20 MG tablet Take 1 tablet (20 mg total) by mouth every morning. 30 tablet 2   potassium chloride  SA (KLOR-CON  M) 20 MEQ tablet TAKE 1 TABLET(20 MEQ) BY MOUTH DAILY 7 tablet 0   prochlorperazine  (COMPAZINE ) 10 MG tablet Take 1 tablet (10 mg total) by mouth every 6 (six) hours as needed for nausea or vomiting. 30 tablet 1   umeclidinium-vilanterol (ANORO ELLIPTA ) 62.5-25 MCG/ACT AEPB Inhale 1 puff into the lungs daily. 1 each 3   No current facility-administered medications for this visit.    Physical Exam BP 136/78 (BP Location: Left Arm, Patient Position: Sitting, Cuff Size: Normal)   Pulse 94   Resp 20   Ht 5' 10 (1.778 m)   Wt 148 lb (67.1 kg)   SpO2 92% Comment: RA  BMI 21.24 kg/m  Ill-appearing 73 year old man in no acute distress Cachectic Breath sounds diminished but present over left chest  Diagnostic Tests: Chest x-ray shows trapped left lung with loculated fluid.  Increased compared to prior.  Less than preop.  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  The lung  is not going to reexpand.  Fluid is filling the space as expected.  Respiratory status is stable albeit relatively poor.  Not many options really.  Could do intermittent thoracentesis dilated, is more symptomatic.  Another option would be to place a pleural catheter but likely would not be able to drain all the fluid with that.  Also will not get lung reexpansion so would be stuck with a catheter long-term.  Currently his respiratory status is about the same as it has been and no need for intervention presently.  Plan: Follow-up with Dr. Melanee I will be happy to see Mr. Loken back if I can be of any assistance with his care in the future  Elspeth JAYSON Millers, MD [facility] [phone]

## 2023-08-09 ENCOUNTER — Encounter: Payer: Self-pay | Admitting: Oncology

## 2023-08-09 ENCOUNTER — Ambulatory Visit
Admission: RE | Admit: 2023-08-09 | Discharge: 2023-08-09 | Disposition: A | Discharge status: Home / Self Care | Source: Ambulatory Visit | Attending: Oncology | Admitting: Oncology

## 2023-08-09 DIAGNOSIS — C3432 Malignant neoplasm of lower lobe, left bronchus or lung: Secondary | ICD-10-CM | POA: Insufficient documentation

## 2023-08-09 MED ORDER — IOHEXOL 300 MG/ML  SOLN
80.0000 mL | Freq: Once | INTRAMUSCULAR | Status: AC | PRN
  Administered 2023-08-09: 80 mL via INTRAVENOUS

## 2023-08-16 ENCOUNTER — Encounter: Payer: Self-pay | Admitting: Oncology

## 2023-08-16 ENCOUNTER — Inpatient Hospital Stay (HOSPITAL_BASED_OUTPATIENT_CLINIC_OR_DEPARTMENT_OTHER): Admitting: Oncology

## 2023-08-16 ENCOUNTER — Inpatient Hospital Stay: Attending: Oncology

## 2023-08-16 ENCOUNTER — Inpatient Hospital Stay

## 2023-08-16 VITALS — BP 125/96 | HR 95 | Temp 98.8°F | Resp 19 | Ht 70.0 in | Wt 146.9 lb

## 2023-08-16 DIAGNOSIS — C3432 Malignant neoplasm of lower lobe, left bronchus or lung: Secondary | ICD-10-CM | POA: Diagnosis not present

## 2023-08-16 DIAGNOSIS — J90 Pleural effusion, not elsewhere classified: Secondary | ICD-10-CM | POA: Diagnosis not present

## 2023-08-16 DIAGNOSIS — Z79899 Other long term (current) drug therapy: Secondary | ICD-10-CM | POA: Diagnosis not present

## 2023-08-16 DIAGNOSIS — N400 Enlarged prostate without lower urinary tract symptoms: Secondary | ICD-10-CM | POA: Diagnosis not present

## 2023-08-16 DIAGNOSIS — I10 Essential (primary) hypertension: Secondary | ICD-10-CM | POA: Insufficient documentation

## 2023-08-16 DIAGNOSIS — R5382 Chronic fatigue, unspecified: Secondary | ICD-10-CM | POA: Insufficient documentation

## 2023-08-16 DIAGNOSIS — I2510 Atherosclerotic heart disease of native coronary artery without angina pectoris: Secondary | ICD-10-CM | POA: Diagnosis not present

## 2023-08-16 DIAGNOSIS — R630 Anorexia: Secondary | ICD-10-CM

## 2023-08-16 DIAGNOSIS — Z87891 Personal history of nicotine dependence: Secondary | ICD-10-CM | POA: Insufficient documentation

## 2023-08-16 DIAGNOSIS — Z5111 Encounter for antineoplastic chemotherapy: Secondary | ICD-10-CM | POA: Diagnosis not present

## 2023-08-16 DIAGNOSIS — I700 Atherosclerosis of aorta: Secondary | ICD-10-CM | POA: Insufficient documentation

## 2023-08-16 DIAGNOSIS — J948 Other specified pleural conditions: Secondary | ICD-10-CM | POA: Insufficient documentation

## 2023-08-16 DIAGNOSIS — C782 Secondary malignant neoplasm of pleura: Secondary | ICD-10-CM | POA: Insufficient documentation

## 2023-08-16 DIAGNOSIS — R530 Neoplastic (malignant) related fatigue: Secondary | ICD-10-CM | POA: Insufficient documentation

## 2023-08-16 DIAGNOSIS — E785 Hyperlipidemia, unspecified: Secondary | ICD-10-CM | POA: Insufficient documentation

## 2023-08-16 DIAGNOSIS — C779 Secondary and unspecified malignant neoplasm of lymph node, unspecified: Secondary | ICD-10-CM | POA: Diagnosis not present

## 2023-08-16 LAB — CMP (CANCER CENTER ONLY)
ALT: 10 U/L (ref 0–44)
AST: 16 U/L (ref 15–41)
Albumin: 2.9 g/dL — ABNORMAL LOW (ref 3.5–5.0)
Alkaline Phosphatase: 59 U/L (ref 38–126)
Anion gap: 7 (ref 5–15)
BUN: 14 mg/dL (ref 8–23)
CO2: 23 mmol/L (ref 22–32)
Calcium: 8.7 mg/dL — ABNORMAL LOW (ref 8.9–10.3)
Chloride: 107 mmol/L (ref 98–111)
Creatinine: 1.02 mg/dL (ref 0.61–1.24)
GFR, Estimated: >60 mL/min (ref >60)
Glucose, Bld: 106 mg/dL — ABNORMAL HIGH (ref 70–99)
Potassium: 3.3 mmol/L — ABNORMAL LOW (ref 3.5–5.1)
Sodium: 137 mmol/L (ref 135–145)
Total Bilirubin: 0.8 mg/dL (ref 0.0–1.2)
Total Protein: 7.2 g/dL (ref 6.5–8.1)

## 2023-08-16 LAB — IRON AND TIBC
Iron: 33 ug/dL — ABNORMAL LOW (ref 45–182)
Saturation Ratios: 16 % — ABNORMAL LOW (ref 17.9–39.5)
TIBC: 213 ug/dL — ABNORMAL LOW (ref 250–450)
UIBC: 180 ug/dL

## 2023-08-16 LAB — CBC WITH DIFFERENTIAL (CANCER CENTER ONLY)
Abs Immature Granulocytes: 0.03 10*3/uL (ref 0.00–0.07)
Basophils Absolute: 0 10*3/uL (ref 0.0–0.1)
Basophils Relative: 0 %
Eosinophils Absolute: 0.1 10*3/uL (ref 0.0–0.5)
Eosinophils Relative: 1 %
HCT: 31.9 % — ABNORMAL LOW (ref 39.0–52.0)
Hemoglobin: 9.8 g/dL — ABNORMAL LOW (ref 13.0–17.0)
Immature Granulocytes: 1 %
Lymphocytes Relative: 12 %
Lymphs Abs: 0.8 10*3/uL (ref 0.7–4.0)
MCH: 25.5 pg — ABNORMAL LOW (ref 26.0–34.0)
MCHC: 30.7 g/dL (ref 30.0–36.0)
MCV: 83.1 fL (ref 80.0–100.0)
Monocytes Absolute: 0.8 10*3/uL (ref 0.1–1.0)
Monocytes Relative: 12 %
Neutro Abs: 5 10*3/uL (ref 1.7–7.7)
Neutrophils Relative %: 74 %
Platelet Count: 480 10*3/uL — ABNORMAL HIGH (ref 150–400)
RBC: 3.84 MIL/uL — ABNORMAL LOW (ref 4.22–5.81)
RDW: 23.6 % — ABNORMAL HIGH (ref 11.5–15.5)
WBC Count: 6.7 10*3/uL (ref 4.0–10.5)
nRBC: 0 % (ref 0.0–0.2)

## 2023-08-16 LAB — FERRITIN: Ferritin: 778 ng/mL — ABNORMAL HIGH (ref 24–336)

## 2023-08-16 MED ORDER — HEPARIN SOD (PORK) LOCK FLUSH 100 UNIT/ML IV SOLN
500.0000 [IU] | Freq: Once | INTRAVENOUS | Status: AC | PRN
  Administered 2023-08-16: 500 [IU]
  Filled 2023-08-16: qty 5

## 2023-08-16 MED ORDER — SODIUM CHLORIDE 0.9 % IV SOLN
INTRAVENOUS | Status: DC
  Filled 2023-08-16: qty 250

## 2023-08-16 MED ORDER — MEGESTROL ACETATE 40 MG PO TABS: 40.0000 mg | ORAL_TABLET | Freq: Every day | ORAL | 2 refills | Status: DC

## 2023-08-16 MED ORDER — SODIUM CHLORIDE 0.9 % IV SOLN
800.0000 mg/m2 | Freq: Once | INTRAVENOUS | Status: AC
  Administered 2023-08-16: 1482 mg via INTRAVENOUS
  Filled 2023-08-16: qty 38.98

## 2023-08-16 MED ORDER — PROCHLORPERAZINE MALEATE 10 MG PO TABS
10.0000 mg | ORAL_TABLET | Freq: Once | ORAL | Status: AC
Start: 2023-08-16 — End: 2023-08-16
  Administered 2023-08-16: 10 mg via ORAL
  Filled 2023-08-16: qty 1

## 2023-08-16 NOTE — Progress Notes (Signed)
 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  No evidence of distant metastatic disease.  Pathology was consistent with non-small cell lung cancer favor adenocarcinoma.  Cells were positive for TTF-1 and negative for p40.   He received 1 cycle of CarboTaxol and then had significant reaction to Taxol .  He was switched to Abraxane  for 3 more cycles but due to shortage of Abraxane  he was switched to carbo Alimta  for 2 cycles ending in October 2021 concurrent with radiation.  Subsequently he was started on maintenance durvalumab  which she continued until September 2022.     CT scans in October 2022 showed concern for hilar recurrence and patient was retreated with carbo Alimta  followed by maintenance Alimta  which she received until December 2023.  He has been off treatment since then due to stable disease but Alimta  was also complicated by worsening anemia and AKI.   Skains while he was on Alimta  showed mild to moderate left pleural effusion.  He has had multiple thoracentesis in the past which were all negative for malignancy. PET scan in October 2024  did not show any evidence of recurrent or progressive disease elsewhere but showed large left pleural effusion with complete left lower lobe collapse.  Also noted to have small to moderate pericardial effusion on his CT scans.    [facility] was [facility] and underwentVATS procedure with decortication and drainage of pleural and pericardial effusion.  Cytology from thoracentesis as well as pericardial fluid was negative for malignancy.  Pericardial biopsy showed nonspecific chronic inflammation.  However his visceral pleural biopsy showed scant foci of non-small cell carcinoma only within lymphovascular space.   NGS testing back in 2021 showed no evidence of actionable mutations.  Tumor mutational burden 0.  MSI stable PD-L1 10%   He was noted to have moderate to large left pleural effusions in multiple locations along with small to moderate pericardial effusion for which she underwent thoracentesis in the past and all of them have been negative for malignancy. He was seen by CT surgery in March 2025 and underwent VATS procedure along with decortication. Pleural and pericardial fluid was again negative for malignancy. Pericardial biopsy showed no malignancy but there were foci of adenocarcinoma within the lymphovascular space noted in the visceral pleura.  He was started on third line palliative gemcitabine .  Docetaxel could not be attempted given his prior reaction to Taxol     Interval history-patient reports chronic fatigue.  Exertional shortness of breath is overall same to mildly improved.  He reports lack of appetite  ECOG PS- 2 Pain scale- 2   Review of systems- Review of Systems  Constitutional:  Negative for chills, fever, malaise/fatigue and weight loss.  HENT:  Negative for congestion,  ear discharge and nosebleeds.   Eyes:  Negative for blurred vision.  Respiratory:  Negative for cough, hemoptysis, sputum production, shortness of breath and wheezing.   Cardiovascular:  Negative for  chest pain, palpitations, orthopnea and claudication.  Gastrointestinal:  Negative for abdominal pain, blood in stool, constipation, diarrhea, heartburn, melena, nausea and vomiting.  Genitourinary:  Negative for dysuria, flank pain, frequency, hematuria and urgency.  Musculoskeletal:  Negative for back pain, joint pain and myalgias.  Skin:  Negative for rash.  Neurological:  Negative for dizziness, tingling, focal weakness, seizures, weakness and headaches.  Endo/Heme/Allergies:  Does not bruise/bleed easily.  Psychiatric/Behavioral:  Negative for depression and suicidal ideas. The patient does not have insomnia.       Allergies  Allergen Reactions   Taxol  [Paclitaxel ] Other (See Comments)    Chest pain, hip pain, coughing , wheezing     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 [facility])      Past Surgical History:  Procedure Laterality Date   CHEST TUBE INSERTION Right 04/25/2023   Procedure: RIGHT PLEURAL CHEST TUBE INSERTION;  Surgeon: Kerrin Elspeth BROCKS, MD;  Location: [facility] OR;  Service: Thoracic;  Laterality: Right;   ESOPHAGOGASTRODUODENOSCOPY (EGD) WITH PROPOFOL  N/A 12/01/2020   Procedure: ESOPHAGOGASTRODUODENOSCOPY (EGD) WITH PROPOFOL ;  Surgeon: Unk Corinn Skiff, MD;  Location: [facility] ENDOSCOPY;  Service: Gastroenterology;  Laterality: N/A;   IR THORACENTESIS ASP PLEURAL SPACE W/IMG GUIDE  12/26/2022   PERICARDIAL FLUID DRAINAGE N/A 04/25/2023   Procedure: DRAINAGE, PERICARDIAL EFFUSION;  Surgeon: Kerrin Elspeth BROCKS, MD;  Location: [facility] OR;  Service: Thoracic;  Laterality: N/A;   PERICARDIAL WINDOW N/A 04/25/2023   Procedure: CREATION, PERICARDIAL WINDOW;  Surgeon: Kerrin Elspeth BROCKS, MD;  Location: [facility] OR;  Service: Thoracic;  Laterality: N/A;   PLEURAL EFFUSION DRAINAGE Bilateral 04/25/2023   Procedure: DRAINAGE, PLEURAL EFFUSION;  Surgeon: Kerrin Elspeth BROCKS, MD;   Location: [facility] OR;  Service: Thoracic;  Laterality: Bilateral;   PORTA CATH INSERTION N/A 10/22/2019   Procedure: PORTA CATH INSERTION;  Surgeon: Jama Cordella MATSU, MD;  Location: [facility] INVASIVE CV LAB;  Service: Cardiovascular;  Laterality: N/A;   VIDEO ASSISTED THORACOSCOPY (VATS)/DECORTICATION Left 04/25/2023   Procedure: VIDEO ASSISTED THORACOSCOPY (VATS)/DECORTICATION;  Surgeon: Kerrin Elspeth BROCKS, MD;  Location: [facility] OR;  Service: Thoracic;  Laterality: Left;   VIDEO BRONCHOSCOPY WITH ENDOBRONCHIAL ULTRASOUND N/A 09/25/2019   Procedure: VIDEO BRONCHOSCOPY WITH ENDOBRONCHIAL ULTRASOUND;  Surgeon: Tamea Dedra CROME, MD;  Location: [facility] ORS;  Service: Pulmonary;  Laterality: N/A;    Social History   Socioeconomic History   Marital status: Single    Spouse name: Not on file   Number of children: Not on file   Years of education: Not on file   Highest education level: Not on file  Occupational History   Not on file  Tobacco Use   Smoking status: Former    Current packs/day: 0.00    Average packs/day: 1 pack/day for 20.0 years (20.0 ttl pk-yrs)    Types: Cigarettes    Start date: 88    Quit date: 35    Years since quitting: 31.5   Smokeless tobacco: Never  Vaping Use   Vaping status: Never Used  Substance and Sexual Activity   Alcohol use: Not Currently    Comment: not drank any beer in 2 onths   Drug use: Never   Sexual activity: Not Currently  Other Topics Concern   Not on file  Social History Narrative   Not on file   Social Drivers of Health   Financial Resource Strain: Not on file  Food Insecurity: No Food Insecurity (04/23/2023)   Hunger Vital Sign    Worried About Running Out of Food in the Last Year: Never true    Ran Out of Food in the Last Year: Never true  Transportation Needs: No Transportation Needs (04/23/2023)   PRAPARE - Administrator, Civil Service (Medical): No    Lack of Transportation (Non-Medical): No  Physical Activity: Not on file   Stress: Not on file  Social Connections: Socially Isolated (04/23/2023)   Social Connection and Isolation Panel    Frequency of Communication with Friends and Family: More than three times a week    Frequency of Social Gatherings with Friends and Family: Twice a week    Attends Religious Services: Never    Database administrator or Organizations: No    Attends Banker Meetings: Never    Marital Status: Never married  Intimate Partner Violence: Not At Risk (04/23/2023)   Humiliation, Afraid, Rape, and Kick questionnaire    Fear of Current or Ex-Partner: No    Emotionally Abused: No    Physically Abused: No    Sexually Abused: No    Family History  Problem Relation Age of Onset   Cancer Brother      Current Outpatient Medications:    acetaminophen  (TYLENOL ) 500 MG tablet, Take 500 mg by mouth every 6 (six) hours as needed for mild pain (pain score 1-3) (takes as needed for generalized pain.  Denies pain at this time.)., Disp: , Rfl:    albuterol  (VENTOLIN  HFA) 108 (90 Base) MCG/ACT inhaler, INHALE 2 PUFFS INTO THE LUNGS EVERY 4 HOURS AS NEEDED FOR WHEEZING OR SHORTNESS OF BREATH, Disp: 6.7 g, Rfl: 2   ANORO ELLIPTA  62.5-25 MCG/ACT AEPB, INHALE 1 PUFF INTO THE LUNGS DAILY, Disp: 60 each, Rfl: 2   lidocaine -prilocaine  (EMLA ) cream, Apply to affected area once, Disp: 30 g, Rfl: 3   megestrol (MEGACE) 40 MG tablet, Take 1 tablet (40 mg total) by mouth daily., Disp: 30 tablet, Rfl: 2   ondansetron  (ZOFRAN ) 8 MG tablet, Take 1 tablet (8 mg total) by mouth every 8 (eight) hours as needed for nausea or vomiting., Disp: 30 tablet, Rfl: 1   oxyCODONE  (OXY IR/ROXICODONE ) 5 MG immediate release tablet, Take 5 mg by mouth 3 (three) times daily as needed., Disp: , Rfl:    pantoprazole  (PROTONIX ) 20 MG tablet, Take 1 tablet (20 mg total) by mouth every morning., Disp: 30 tablet, Rfl: 2   potassium chloride  SA (KLOR-CON  M) 20 MEQ tablet, TAKE 1 TABLET(20 MEQ) BY MOUTH DAILY, Disp: 7 tablet,  Rfl: 0   prochlorperazine  (COMPAZINE ) 10 MG tablet, Take 1 tablet (10 mg total) by mouth every 6 (six) hours as needed for nausea or vomiting., Disp: 30 tablet, Rfl: 1   umeclidinium-vilanterol (ANORO ELLIPTA ) 62.5-25 MCG/ACT AEPB, Inhale 1 puff into the lungs daily., Disp: 1 each, Rfl: 3 No current facility-administered medications for this visit.  Facility-Administered Medications Ordered in Other Visits:    0.9 %  sodium chloride  infusion, , Intravenous, Continuous, Melanee Annah BROCKS, MD, Stopped at 08/16/23 1122  Physical exam:  Vitals:   08/16/23 0932  BP: (!) 125/96  Pulse: 95  Resp: 19  Temp: 98.8 F (37.1 C)  TempSrc: Tympanic  SpO2: 99%  Weight: 146 lb  14.4 oz (66.6 kg)  Height: 5' 10 (1.778 m)   Physical Exam Cardiovascular:     Rate and Rhythm: Regular rhythm. Tachycardia present.     Heart sounds: Normal heart sounds.  Pulmonary:     Effort: Pulmonary effort is normal.     Comments: Breath sounds decreased over left lung base Abdominal:     General: Bowel sounds are normal.     Palpations: Abdomen is soft.  Skin:    General: Skin is warm and dry.  Neurological:     Mental Status: He is alert and oriented to person, place, and time.      I have personally reviewed labs listed below:    Latest Ref Rng & Units 08/16/2023    9:17 AM  CMP  Glucose 70 - 99 mg/dL 893   BUN 8 - 23 mg/dL 14   Creatinine 9.38 - 1.24 mg/dL 8.97   Sodium 864 - 854 mmol/L 137   Potassium 3.5 - 5.1 mmol/L 3.3   Chloride 98 - 111 mmol/L 107   CO2 22 - 32 mmol/L 23   Calcium 8.9 - 10.3 mg/dL 8.7   Total Protein 6.5 - 8.1 g/dL 7.2   Total Bilirubin 0.0 - 1.2 mg/dL 0.8   Alkaline Phos 38 - 126 U/L 59   AST 15 - 41 U/L 16   ALT 0 - 44 U/L 10       Latest Ref Rng & Units 08/16/2023    9:17 AM  CBC  WBC 4.0 - 10.5 K/uL 6.7   Hemoglobin 13.0 - 17.0 g/dL 9.8   Hematocrit 60.9 - 52.0 % 31.9   Platelets 150 - 400 K/uL 480    I have personally reviewed Radiology images listed below: No  images are attached to the encounter.  CT CHEST ABDOMEN PELVIS W CONTRAST Result Date: 08/11/2023 CLINICAL DATA:  Left lower lobe lung cancer.  * Tracking Code: BO * EXAM: CT CHEST, ABDOMEN, AND PELVIS WITH CONTRAST TECHNIQUE: Multidetector CT imaging of the chest, abdomen and pelvis was performed following the standard protocol during bolus administration of intravenous contrast. RADIATION DOSE REDUCTION: This exam was performed according to the departmental dose-optimization program which includes automated exposure control, adjustment of the mA and/or kV according to patient size and/or use of iterative reconstruction technique. CONTRAST:  80mL OMNIPAQUE  IOHEXOL  300 MG/ML  SOLN COMPARISON:  Chest abdomen pelvis CTs of 11/11/2022. Chest CT 04/24/2023 FINDINGS: CT CHEST FINDINGS Cardiovascular: Right Port-A-Cath tip high right atrium. Aortic atherosclerosis. Tortuous thoracic aorta. Mild cardiomegaly, without pericardial effusion. Lad coronary artery calcification. No central pulmonary embolism, on this non-dedicated study. Mediastinum/Nodes: No mediastinal or hilar adenopathy. Lungs/Pleura: Moderate right pleural effusion is unchanged. Small left pleural effusion is decreased with minimal loculation inferiorly/laterally and inferior medially. New left-sided pneumothorax since the prior CT, detailed on prior chest radiograph. Nodule along the right minor fissure measures 5 mm on 83/3 and is similar to on the prior. A right middle lobe 9 x 10 mm nodule on 100/3 is likely present in retrospect on the prior exam at 9 x 7 mm on 89/4 of that exam. Mild motion degradation throughout. Improved right lower lobe aeration with dependent subsegmental atelectasis. Mild improvement in left lower lobe aeration primarily laterally. Persistent lingular and medial left lower lobe consolidation with air bronchograms. Musculoskeletal: Included within the abdomen pelvic section. CT ABDOMEN PELVIS FINDINGS Hepatobiliary: Mild motion  degradation continuing into the upper abdomen. Normal liver. Normal gallbladder, without biliary ductal dilatation. Pancreas: Pancreatic body  cystic lesion of 12 mm is similar to on the prior, of doubtful clinical significance. No duct dilatation. Spleen: Normal in size, without focal abnormality. Adrenals/Urinary Tract: Normal adrenal glands. Bilateral fluid density renal lesions are likely cysts at up to 1.5 cm . In the absence of clinically indicated signs/symptoms require(s) no independent follow-up. No hydronephrosis. Normal urinary bladder. Stomach/Bowel: Normal stomach, without wall thickening. Colonic stool burden suggests constipation. Normal small bowel. Vascular/Lymphatic: Aortic atherosclerosis. No abdominopelvic adenopathy. Reproductive: Mild prostatomegaly. Other: Trace pelvic fluid, similar. Musculoskeletal: No acute osseous abnormality. Disc bulges at L4-5 and L5-S1. IMPRESSION: 1. Mild motion degradation throughout. 2. Right middle lobe 1.0 cm pulmonary nodule, enlarging over prior exams and suspicious for metastatic disease or synchronous primary. 3. Left-sided hydropneumothorax, as on yesterday's plain film. Slightly improved left-sided aeration, with residual left upper and lower lobe airspace disease which could represent infection or be radiation induced. 4. Similar moderate right pleural effusion with basilar atelectasis. 5. No acute process or evidence of metastatic disease in the abdomen or pelvis. 6. Similar small volume pelvic fluid. 7. Incidental findings, including: Coronary artery atherosclerosis. Aortic Atherosclerosis (ICD10-I70.0). Prostatomegaly. Electronically Signed   By: Rockey Kilts M.D.   On: 08/11/2023 14:43   DG Chest 2 View Result Date: 08/08/2023 CLINICAL DATA:  lung cancer. EXAM: CHEST - 2 VIEW COMPARISON:  06/27/2023. FINDINGS: Redemonstration of moderate left hydropneumothorax, likely related to prior surgery-lobectomy. Correlate clinically to determine the need for  further evaluation with chest CT scan. Findings were present on the prior radiograph from 06/27/2023 however, exhibits increase in the amount of pleural effusion. There are probable atelectatic changes at the right lung base with associated trace right pleural effusion. Bilateral lung fields are otherwise clear. Evaluation of cardiomediastinal silhouette is nondiagnostic due to left lower hemithorax opacification. No acute osseous abnormalities. The soft tissues are within normal limits. Right-sided CT Port-A-Cath is again seen with its tip overlying the cavoatrial junction region. IMPRESSION: Redemonstration of moderate left hydropneumothorax, likely related to prior surgery. Findings were present on the prior radiograph from 06/27/2023 however, exhibits increase in the amount of pleural effusion. Electronically Signed   By: Ree Molt M.D.   On: 08/08/2023 12:20     Assessment and plan- Patient is a 73 y.o. male with history of recurrent adenocarcinoma of the lung here for on treatment assessment prior to cycle 5-day 1 of gemcitabine  chemotherapy  I have reviewed CT chestAbdomen and pelvis images independently and discussed findings with the patient overall his disease is hard to quantify since he was found to have adenocarcinoma in the lymphatic spaces of the pleura on biopsy.  CT scans show stable appearance of right middle lobe and stable bilateral pleural effusions.  No worsening adenopathy or distant metastatic disease.  Plan is to continue gemcitabine  until progression or toxicity.  He will be seen by NP Tinnie Dawn in 3 weeks for cycle 6 and I will see him back in 6 weeks for cycle 7.  Chemo induced fatigue: We will have him take American ginseng 1 g twice daily.  If it does not help we could consider a short course of steroids for him.  Loss of appetite: I am starting him on a trial of Megace   Visit Diagnosis 1. 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)   2. Encounter for  antineoplastic chemotherapy   3. Chronic fatigue   4. Loss of appetite      Dr. Annah Skene, MD, MPH [facility] at [facility]  Center [phone] 08/16/2023 12:36 PM

## 2023-08-17 ENCOUNTER — Encounter: Payer: Self-pay | Admitting: Oncology

## 2023-08-23 ENCOUNTER — Inpatient Hospital Stay

## 2023-08-23 ENCOUNTER — Other Ambulatory Visit: Payer: Self-pay | Admitting: *Deleted

## 2023-08-23 ENCOUNTER — Ambulatory Visit
Admission: RE | Admit: 2023-08-23 | Discharge: 2023-08-23 | Disposition: A | Discharge status: Home / Self Care | Source: Ambulatory Visit | Attending: Oncology | Admitting: Oncology

## 2023-08-23 DIAGNOSIS — C3432 Malignant neoplasm of lower lobe, left bronchus or lung: Secondary | ICD-10-CM | POA: Diagnosis present

## 2023-08-23 DIAGNOSIS — R0602 Shortness of breath: Secondary | ICD-10-CM

## 2023-08-23 LAB — CBC WITH DIFFERENTIAL (CANCER CENTER ONLY)
Abs Immature Granulocytes: 0.02 K/uL (ref 0.00–0.07)
Basophils Absolute: 0 K/uL (ref 0.0–0.1)
Basophils Relative: 1 %
Eosinophils Absolute: 0 K/uL (ref 0.0–0.5)
Eosinophils Relative: 0 %
HCT: 30.1 % — ABNORMAL LOW (ref 39.0–52.0)
Hemoglobin: 9.2 g/dL — ABNORMAL LOW (ref 13.0–17.0)
Immature Granulocytes: 1 %
Lymphocytes Relative: 16 %
Lymphs Abs: 0.5 K/uL — ABNORMAL LOW (ref 0.7–4.0)
MCH: 25.5 pg — ABNORMAL LOW (ref 26.0–34.0)
MCHC: 30.6 g/dL (ref 30.0–36.0)
MCV: 83.4 fL (ref 80.0–100.0)
Monocytes Absolute: 0.6 K/uL (ref 0.1–1.0)
Monocytes Relative: 19 %
Neutro Abs: 2.2 K/uL (ref 1.7–7.7)
Neutrophils Relative %: 63 %
Platelet Count: 397 K/uL (ref 150–400)
RBC: 3.61 MIL/uL — ABNORMAL LOW (ref 4.22–5.81)
RDW: 21.8 % — ABNORMAL HIGH (ref 11.5–15.5)
WBC Count: 3.4 K/uL — ABNORMAL LOW (ref 4.0–10.5)
nRBC: 0 % (ref 0.0–0.2)

## 2023-08-23 LAB — CMP (CANCER CENTER ONLY)
ALT: 12 U/L (ref 0–44)
AST: 33 U/L (ref 15–41)
Albumin: 2.7 g/dL — ABNORMAL LOW (ref 3.5–5.0)
Alkaline Phosphatase: 55 U/L (ref 38–126)
Anion gap: 7 (ref 5–15)
BUN: 16 mg/dL (ref 8–23)
CO2: 23 mmol/L (ref 22–32)
Calcium: 8.7 mg/dL — ABNORMAL LOW (ref 8.9–10.3)
Chloride: 106 mmol/L (ref 98–111)
Creatinine: 1.11 mg/dL (ref 0.61–1.24)
GFR, Estimated: >60 mL/min (ref >60)
Glucose, Bld: 128 mg/dL — ABNORMAL HIGH (ref 70–99)
Potassium: 3.4 mmol/L — ABNORMAL LOW (ref 3.5–5.1)
Sodium: 136 mmol/L (ref 135–145)
Total Bilirubin: 0.8 mg/dL (ref 0.0–1.2)
Total Protein: 7.4 g/dL (ref 6.5–8.1)

## 2023-08-23 MED ORDER — HEPARIN SOD (PORK) LOCK FLUSH 100 UNIT/ML IV SOLN
500.0000 [IU] | Freq: Once | INTRAVENOUS | Status: AC
  Administered 2023-08-23: 500 [IU] via INTRAVENOUS
  Filled 2023-08-23: qty 5

## 2023-08-23 MED ORDER — SODIUM CHLORIDE 0.9 % IV SOLN
Freq: Once | INTRAVENOUS | Status: AC
  Filled 2023-08-23: qty 250

## 2023-08-23 NOTE — Progress Notes (Signed)
 Pt reports he does not feel well, Like I have been hit by a truck and would like to not have treatment today. Per Dr. Melanee hold treatment, pt to have 1 liter NS over 1 hour and then report to the medical mall for a CXR. Pt aware and verbalizes understanding.

## 2023-09-06 ENCOUNTER — Inpatient Hospital Stay (HOSPITAL_BASED_OUTPATIENT_CLINIC_OR_DEPARTMENT_OTHER): Admitting: Nurse Practitioner

## 2023-09-06 ENCOUNTER — Inpatient Hospital Stay

## 2023-09-06 ENCOUNTER — Encounter: Payer: Self-pay | Admitting: Nurse Practitioner

## 2023-09-06 VITALS — BP 153/85 | HR 82 | Resp 18

## 2023-09-06 VITALS — BP 147/89 | HR 90 | Temp 98.2°F | Resp 18 | Ht 70.0 in | Wt 143.0 lb

## 2023-09-06 DIAGNOSIS — C3432 Malignant neoplasm of lower lobe, left bronchus or lung: Secondary | ICD-10-CM | POA: Diagnosis not present

## 2023-09-06 DIAGNOSIS — Z5111 Encounter for antineoplastic chemotherapy: Secondary | ICD-10-CM | POA: Diagnosis not present

## 2023-09-06 LAB — CBC WITH DIFFERENTIAL (CANCER CENTER ONLY)
Abs Immature Granulocytes: 0.03 K/uL (ref 0.00–0.07)
Basophils Absolute: 0 K/uL (ref 0.0–0.1)
Basophils Relative: 0 %
Eosinophils Absolute: 0 K/uL (ref 0.0–0.5)
Eosinophils Relative: 1 %
HCT: 32.5 % — ABNORMAL LOW (ref 39.0–52.0)
Hemoglobin: 10.1 g/dL — ABNORMAL LOW (ref 13.0–17.0)
Immature Granulocytes: 0 %
Lymphocytes Relative: 13 %
Lymphs Abs: 1 K/uL (ref 0.7–4.0)
MCH: 26.1 pg (ref 26.0–34.0)
MCHC: 31.1 g/dL (ref 30.0–36.0)
MCV: 84 fL (ref 80.0–100.0)
Monocytes Absolute: 0.8 K/uL (ref 0.1–1.0)
Monocytes Relative: 11 %
Neutro Abs: 5.4 K/uL (ref 1.7–7.7)
Neutrophils Relative %: 75 %
Platelet Count: 443 K/uL — ABNORMAL HIGH (ref 150–400)
RBC: 3.87 MIL/uL — ABNORMAL LOW (ref 4.22–5.81)
RDW: 20.3 % — ABNORMAL HIGH (ref 11.5–15.5)
WBC Count: 7.2 K/uL (ref 4.0–10.5)
nRBC: 0 % (ref 0.0–0.2)

## 2023-09-06 LAB — CMP (CANCER CENTER ONLY)
ALT: 11 U/L (ref 0–44)
AST: 15 U/L (ref 15–41)
Albumin: 2.8 g/dL — ABNORMAL LOW (ref 3.5–5.0)
Alkaline Phosphatase: 62 U/L (ref 38–126)
Anion gap: 4 — ABNORMAL LOW (ref 5–15)
BUN: 13 mg/dL (ref 8–23)
CO2: 24 mmol/L (ref 22–32)
Calcium: 8.4 mg/dL — ABNORMAL LOW (ref 8.9–10.3)
Chloride: 107 mmol/L (ref 98–111)
Creatinine: 0.96 mg/dL (ref 0.61–1.24)
GFR, Estimated: >60 mL/min (ref >60)
Glucose, Bld: 116 mg/dL — ABNORMAL HIGH (ref 70–99)
Potassium: 3.7 mmol/L (ref 3.5–5.1)
Sodium: 135 mmol/L (ref 135–145)
Total Bilirubin: 0.5 mg/dL (ref 0.0–1.2)
Total Protein: 7.2 g/dL (ref 6.5–8.1)

## 2023-09-06 MED ORDER — PROCHLORPERAZINE MALEATE 10 MG PO TABS
10.0000 mg | ORAL_TABLET | Freq: Once | ORAL | Status: AC
Start: 2023-09-06 — End: 2023-09-06
  Administered 2023-09-06: 10 mg via ORAL
  Filled 2023-09-06: qty 1

## 2023-09-06 MED ORDER — SODIUM CHLORIDE 0.9 % IV SOLN
800.0000 mg/m2 | Freq: Once | INTRAVENOUS | Status: AC
  Administered 2023-09-06: 1482 mg via INTRAVENOUS
  Filled 2023-09-06: qty 38.98

## 2023-09-06 MED ORDER — SODIUM CHLORIDE 0.9 % IV SOLN
INTRAVENOUS | Status: DC
Start: 2023-09-06 — End: 2023-09-06
  Filled 2023-09-06: qty 250

## 2023-09-06 MED ORDER — HEPARIN SOD (PORK) LOCK FLUSH 100 UNIT/ML IV SOLN
500.0000 [IU] | Freq: Once | INTRAVENOUS | Status: AC | PRN
Start: 2023-09-06 — End: 2023-09-06
  Administered 2023-09-06: 500 [IU]
  Filled 2023-09-06: qty 5

## 2023-09-06 NOTE — Progress Notes (Signed)
 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  No evidence of distant metastatic disease.  Pathology was consistent with non-small cell lung cancer favor adenocarcinoma.  Cells were positive for TTF-1 and negative for p40.   He received 1 cycle of CarboTaxol and then had significant reaction to Taxol .  He was switched to Abraxane  for 3 more cycles but due to shortage of Abraxane  he was switched to carbo Alimta  for 2 cycles ending in October 2021 concurrent with radiation.  Subsequently he was started on maintenance durvalumab  which she continued until September 2022.     CT scans in October 2022 showed concern for hilar recurrence and patient was retreated with carbo Alimta  followed by maintenance Alimta  which she received until December 2023.  He has been off treatment since then due to stable disease but Alimta  was also complicated by worsening anemia and AKI.   Skains while he was on Alimta  showed mild to moderate left pleural effusion.  He has had multiple thoracentesis in the past which were all negative for malignancy. PET scan in October 2024 did  not show any evidence of recurrent or progressive disease elsewhere but showed large left pleural effusion with complete left lower lobe collapse.  Also noted to have small to moderate pericardial effusion on his CT scans.    He was admitted to [facility] and underwentVATS procedure with decortication and drainage of pleural and pericardial effusion.  Cytology from thoracentesis as well as pericardial fluid was negative for malignancy.  Pericardial biopsy showed nonspecific chronic inflammation.  However his visceral pleural biopsy showed scant foci of non-small cell carcinoma only within lymphovascular space.   NGS testing back in 2021 showed no evidence of actionable mutations.  Tumor mutational burden 0.  MSI stable PD-L1 10%   He was noted to have moderate to large left pleural effusions in multiple locations along with small to moderate pericardial effusion for which she underwent thoracentesis in the past and all of them have been negative for malignancy. He was seen by CT surgery in March 2025 and underwent VATS procedure along with decortication. Pleural and pericardial fluid was again negative for malignancy. Pericardial biopsy showed no malignancy but there were foci of adenocarcinoma within the lymphovascular space noted in the visceral pleura.  He was started on third line palliative gemcitabine .  Docetaxel could not be attempted given his prior reaction to Taxol     Interval history- Mitchell Frye is a 73 y.o. male who returns to clinic for consideration of gemcitabine  chemotherapy. He feels at baseline. Appetite is down and he has lost 3 lbs. He has ongoing fatigue which is worse with chemo. He has shortness of breath.   ECOG PS- 2 Pain scale- 3  Review of  systems- Review of Systems  Constitutional:  Positive for malaise/fatigue. Negative for chills, fever and weight loss.  HENT:  Negative for congestion, ear discharge and nosebleeds.   Eyes:  Negative for blurred vision.  Respiratory:   Positive for shortness of breath. Negative for cough, hemoptysis, sputum production and wheezing.   Cardiovascular:  Negative for chest pain, palpitations, orthopnea and claudication.  Gastrointestinal:  Negative for abdominal pain, blood in stool, constipation, diarrhea, heartburn, melena, nausea and vomiting.  Genitourinary:  Negative for dysuria, flank pain, frequency, hematuria and urgency.  Musculoskeletal:  Negative for back pain, joint pain and myalgias.  Skin:  Negative for rash.  Neurological:  Negative for dizziness, tingling, focal weakness, seizures, weakness and headaches.  Endo/Heme/Allergies:  Does not bruise/bleed easily.  Psychiatric/Behavioral:  Negative for depression and suicidal ideas. The patient does not have insomnia.     Allergies  Allergen Reactions   Taxol  [Paclitaxel ] Other (See Comments)    Chest pain, hip pain, coughing , wheezing   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 [facility])    Past Surgical History:  Procedure Laterality Date   CHEST TUBE INSERTION Right 04/25/2023   Procedure: RIGHT PLEURAL CHEST TUBE INSERTION;  Surgeon: Kerrin Elspeth BROCKS, MD;  Location: [facility] OR;  Service: Thoracic;  Laterality: Right;   ESOPHAGOGASTRODUODENOSCOPY (EGD) WITH PROPOFOL  N/A 12/01/2020   Procedure: ESOPHAGOGASTRODUODENOSCOPY (EGD) WITH PROPOFOL ;  Surgeon: Unk Corinn Skiff, MD;  Location: [facility] ENDOSCOPY;  Service: Gastroenterology;  Laterality: N/A;   IR THORACENTESIS ASP PLEURAL SPACE W/IMG GUIDE  12/26/2022   PERICARDIAL FLUID DRAINAGE N/A 04/25/2023   Procedure: DRAINAGE, PERICARDIAL EFFUSION;  Surgeon: Kerrin Elspeth BROCKS, MD;  Location: [facility] OR;  Service: Thoracic;  Laterality: N/A;   PERICARDIAL WINDOW N/A 04/25/2023   Procedure: CREATION, PERICARDIAL WINDOW;  Surgeon: Kerrin Elspeth BROCKS, MD;  Location: [facility] OR;  Service: Thoracic;  Laterality: N/A;   PLEURAL  EFFUSION DRAINAGE Bilateral 04/25/2023   Procedure: DRAINAGE, PLEURAL EFFUSION;  Surgeon: Kerrin Elspeth BROCKS, MD;  Location: [facility] OR;  Service: Thoracic;  Laterality: Bilateral;   PORTA CATH INSERTION N/A 10/22/2019   Procedure: PORTA CATH INSERTION;  Surgeon: Jama Cordella MATSU, MD;  Location: [facility] INVASIVE CV LAB;  Service: Cardiovascular;  Laterality: N/A;   VIDEO ASSISTED THORACOSCOPY (VATS)/DECORTICATION Left 04/25/2023   Procedure: VIDEO ASSISTED THORACOSCOPY (VATS)/DECORTICATION;  Surgeon: Kerrin Elspeth BROCKS, MD;  Location: [facility] OR;  Service: Thoracic;  Laterality: Left;   VIDEO BRONCHOSCOPY WITH ENDOBRONCHIAL ULTRASOUND N/A 09/25/2019   Procedure: VIDEO BRONCHOSCOPY WITH ENDOBRONCHIAL ULTRASOUND;  Surgeon: Tamea Dedra CROME, MD;  Location: [facility] ORS;  Service: Pulmonary;  Laterality: N/A;   Social History   Socioeconomic History   Marital status: Single    Spouse name: Not on file   Number of children: Not on file   Years of education: Not on file   Highest education level: Not on file  Occupational History   Not on file  Tobacco Use   Smoking status: Former    Current packs/day: 0.00    Average packs/day: 1 pack/day for 20.0 years (20.0 ttl pk-yrs)    Types: Cigarettes    Start date: 78    Quit date: 27    Years since quitting: 31.5   Smokeless tobacco: Never  Vaping Use   Vaping status: Never Used  Substance and Sexual Activity   Alcohol use: Not Currently    Comment:  not drank any beer in 2 onths   Drug use: Never   Sexual activity: Not Currently  Other Topics Concern   Not on file  Social History Narrative   Not on file   Social Drivers of Health   Financial Resource Strain: Not on file  Food Insecurity: No Food Insecurity (04/23/2023)   Hunger Vital Sign    Worried About Running Out of Food in the Last Year: Never true    Ran Out of Food in the Last Year: Never true  Transportation Needs: No Transportation Needs (04/23/2023)   PRAPARE - Therapist, art (Medical): No    Lack of Transportation (Non-Medical): No  Physical Activity: Not on file  Stress: Not on file  Social Connections: Socially Isolated (04/23/2023)   Social Connection and Isolation Panel    Frequency of Communication with Friends and Family: More than three times a week    Frequency of Social Gatherings with Friends and Family: Twice a week    Attends Religious Services: Never    Database administrator or Organizations: No    Attends Banker Meetings: Never    Marital Status: Never married  Intimate Partner Violence: Not At Risk (04/23/2023)   Humiliation, Afraid, Rape, and Kick questionnaire    Fear of Current or Ex-Partner: No    Emotionally Abused: No    Physically Abused: No    Sexually Abused: No   Family History  Problem Relation Age of Onset   Cancer Brother     Current Outpatient Medications:    acetaminophen  (TYLENOL ) 500 MG tablet, Take 500 mg by mouth every 6 (six) hours as needed for mild pain (pain score 1-3) (takes as needed for generalized pain.  Denies pain at this time.)., Disp: , Rfl:    albuterol  (VENTOLIN  HFA) 108 (90 Base) MCG/ACT inhaler, INHALE 2 PUFFS INTO THE LUNGS EVERY 4 HOURS AS NEEDED FOR WHEEZING OR SHORTNESS OF BREATH, Disp: 6.7 g, Rfl: 2   ANORO ELLIPTA  62.5-25 MCG/ACT AEPB, INHALE 1 PUFF INTO THE LUNGS DAILY, Disp: 60 each, Rfl: 2   lidocaine -prilocaine  (EMLA ) cream, Apply to affected area once, Disp: 30 g, Rfl: 3   megestrol  (MEGACE ) 40 MG tablet, Take 1 tablet (40 mg total) by mouth daily., Disp: 30 tablet, Rfl: 2   ondansetron  (ZOFRAN ) 8 MG tablet, Take 1 tablet (8 mg total) by mouth every 8 (eight) hours as needed for nausea or vomiting., Disp: 30 tablet, Rfl: 1   oxyCODONE  (OXY IR/ROXICODONE ) 5 MG immediate release tablet, Take 5 mg by mouth 3 (three) times daily as needed., Disp: , Rfl:    pantoprazole  (PROTONIX ) 20 MG tablet, Take 1 tablet (20 mg total) by mouth every morning., Disp: 30 tablet,  Rfl: 2   potassium chloride  SA (KLOR-CON  M) 20 MEQ tablet, TAKE 1 TABLET(20 MEQ) BY MOUTH DAILY, Disp: 7 tablet, Rfl: 0   prochlorperazine  (COMPAZINE ) 10 MG tablet, Take 1 tablet (10 mg total) by mouth every 6 (six) hours as needed for nausea or vomiting., Disp: 30 tablet, Rfl: 1   umeclidinium-vilanterol (ANORO ELLIPTA ) 62.5-25 MCG/ACT AEPB, Inhale 1 puff into the lungs daily., Disp: 1 each, Rfl: 3  Physical exam:  Vitals:   09/06/23 1408 09/06/23 1415  BP: (!) 147/89   Pulse: 90   Resp:  18  Temp: 98.2 F (36.8 C)   TempSrc:  Tympanic  SpO2: 98%   Weight:  143 lb (64.9 kg)  Height:  5' 10 (1.778  m)   Physical Exam Vitals reviewed.  Constitutional:      Comments: Fatigued appearing  Cardiovascular:     Rate and Rhythm: Normal rate and regular rhythm.  Pulmonary:     Effort: No respiratory distress.     Comments: Diminished breath sounds R > L Abdominal:     General: There is no distension.     Palpations: Abdomen is soft.     Tenderness: There is no abdominal tenderness.  Skin:    General: Skin is warm and dry.     Coloration: Skin is not pale.  Neurological:     Mental Status: He is alert and oriented to person, place, and time.  Psychiatric:        Behavior: Behavior normal.      I have personally reviewed labs listed below:    Latest Ref Rng & Units 09/06/2023    1:52 PM  CMP  Glucose 70 - 99 mg/dL 883   BUN 8 - 23 mg/dL 13   Creatinine 9.38 - 1.24 mg/dL 9.03   Sodium 864 - 854 mmol/L 135   Potassium 3.5 - 5.1 mmol/L 3.7   Chloride 98 - 111 mmol/L 107   CO2 22 - 32 mmol/L 24   Calcium 8.9 - 10.3 mg/dL 8.4   Total Protein 6.5 - 8.1 g/dL 7.2   Total Bilirubin 0.0 - 1.2 mg/dL 0.5   Alkaline Phos 38 - 126 U/L 62   AST 15 - 41 U/L 15   ALT 0 - 44 U/L 11       Latest Ref Rng & Units 09/06/2023    1:52 PM  CBC  WBC 4.0 - 10.5 K/uL 7.2   Hemoglobin 13.0 - 17.0 g/dL 89.8   Hematocrit 60.9 - 52.0 % 32.5   Platelets 150 - 400 K/uL 443    I have personally  reviewed Radiology images listed below: DG Chest 2 View Result Date: 08/23/2023 CLINICAL DATA:  Lung cancer.  Shortness of breath. EXAM: CHEST - 2 VIEW COMPARISON:  Chest radiograph dated 08/08/2023. FINDINGS: Right-sided Port-A-Cath in similar position. Decrease in the size of the left hydropneumothorax. Left mid to lower lung field atelectasis or infiltrate. Stable cardiac silhouette no acute osseous pathology. IMPRESSION: Decrease in the size of the left hydropneumothorax. Electronically Signed   By: Vanetta Chou M.D.   On: 08/23/2023 13:11   CT CHEST ABDOMEN PELVIS W CONTRAST Result Date: 08/11/2023 CLINICAL DATA:  Left lower lobe lung cancer.  * Tracking Code: BO * EXAM: CT CHEST, ABDOMEN, AND PELVIS WITH CONTRAST TECHNIQUE: Multidetector CT imaging of the chest, abdomen and pelvis was performed following the standard protocol during bolus administration of intravenous contrast. RADIATION DOSE REDUCTION: This exam was performed according to the departmental dose-optimization program which includes automated exposure control, adjustment of the mA and/or kV according to patient size and/or use of iterative reconstruction technique. CONTRAST:  80mL OMNIPAQUE  IOHEXOL  300 MG/ML  SOLN COMPARISON:  Chest abdomen pelvis CTs of 11/11/2022. Chest CT 04/24/2023 FINDINGS: CT CHEST FINDINGS Cardiovascular: Right Port-A-Cath tip high right atrium. Aortic atherosclerosis. Tortuous thoracic aorta. Mild cardiomegaly, without pericardial effusion. Lad coronary artery calcification. No central pulmonary embolism, on this non-dedicated study. Mediastinum/Nodes: No mediastinal or hilar adenopathy. Lungs/Pleura: Moderate right pleural effusion is unchanged. Small left pleural effusion is decreased with minimal loculation inferiorly/laterally and inferior medially. New left-sided pneumothorax since the prior CT, detailed on prior chest radiograph. Nodule along the right minor fissure measures 5 mm on 83/3 and is similar  to on  the prior. A right middle lobe 9 x 10 mm nodule on 100/3 is likely present in retrospect on the prior exam at 9 x 7 mm on 89/4 of that exam. Mild motion degradation throughout. Improved right lower lobe aeration with dependent subsegmental atelectasis. Mild improvement in left lower lobe aeration primarily laterally. Persistent lingular and medial left lower lobe consolidation with air bronchograms. Musculoskeletal: Included within the abdomen pelvic section. CT ABDOMEN PELVIS FINDINGS Hepatobiliary: Mild motion degradation continuing into the upper abdomen. Normal liver. Normal gallbladder, without biliary ductal dilatation. Pancreas: Pancreatic body cystic lesion of 12 mm is similar to on the prior, of doubtful clinical significance. No duct dilatation. Spleen: Normal in size, without focal abnormality. Adrenals/Urinary Tract: Normal adrenal glands. Bilateral fluid density renal lesions are likely cysts at up to 1.5 cm . In the absence of clinically indicated signs/symptoms require(s) no independent follow-up. No hydronephrosis. Normal urinary bladder. Stomach/Bowel: Normal stomach, without wall thickening. Colonic stool burden suggests constipation. Normal small bowel. Vascular/Lymphatic: Aortic atherosclerosis. No abdominopelvic adenopathy. Reproductive: Mild prostatomegaly. Other: Trace pelvic fluid, similar. Musculoskeletal: No acute osseous abnormality. Disc bulges at L4-5 and L5-S1. IMPRESSION: 1. Mild motion degradation throughout. 2. Right middle lobe 1.0 cm pulmonary nodule, enlarging over prior exams and suspicious for metastatic disease or synchronous primary. 3. Left-sided hydropneumothorax, as on yesterday's plain film. Slightly improved left-sided aeration, with residual left upper and lower lobe airspace disease which could represent infection or be radiation induced. 4. Similar moderate right pleural effusion with basilar atelectasis. 5. No acute process or evidence of metastatic disease in the  abdomen or pelvis. 6. Similar small volume pelvic fluid. 7. Incidental findings, including: Coronary artery atherosclerosis. Aortic Atherosclerosis (ICD10-I70.0). Prostatomegaly. Electronically Signed   By: Rockey Kilts M.D.   On: 08/11/2023 14:43   DG Chest 2 View Result Date: 08/08/2023 CLINICAL DATA:  lung cancer. EXAM: CHEST - 2 VIEW COMPARISON:  06/27/2023. FINDINGS: Redemonstration of moderate left hydropneumothorax, likely related to prior surgery-lobectomy. Correlate clinically to determine the need for further evaluation with chest CT scan. Findings were present on the prior radiograph from 06/27/2023 however, exhibits increase in the amount of pleural effusion. There are probable atelectatic changes at the right lung base with associated trace right pleural effusion. Bilateral lung fields are otherwise clear. Evaluation of cardiomediastinal silhouette is nondiagnostic due to left lower hemithorax opacification. No acute osseous abnormalities. The soft tissues are within normal limits. Right-sided CT Port-A-Cath is again seen with its tip overlying the cavoatrial junction region. IMPRESSION: Redemonstration of moderate left hydropneumothorax, likely related to prior surgery. Findings were present on the prior radiograph from 06/27/2023 however, exhibits increase in the amount of pleural effusion. Electronically Signed   By: Ree Molt M.D.   On: 08/08/2023 12:20    Assessment and plan- Patient is a 73 y.o. male who returns to clinic for follow up of:   Recurrent adenocarcinoma of the lung - here for on treatment assessment prior to cycle 5-day 8 of gemcitabine  chemotherapy. Labs today reviewed and acceptable for treatment. Proceed with gemcitabine  today. Tolerating moderately.  Chemo induced fatigue- loss of appetite likely also contributing. Encouraged him to trial [place] ginseng 1 g twice a day. Could also consider short course of steroids if refractory.  Weight loss- encouraged trial of  megace . Encouraged supplements/ shakes with calorie dense, frequent meals.  Shortness of breath- ref to IR for palliative thoracentesis Goals of care- treatment given with palliative intent  Disposition:  Chemo today F/u as scheduled  Ref for thoracentesis- la   Visit Diagnosis 1. Encounter for antineoplastic chemotherapy   2. 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)    Tinnie Dawn, DNP, AGNP-C, AOCNP [facility] [phone] (clinic) 09/06/2023

## 2023-09-06 NOTE — Patient Instructions (Signed)
 [facility] - A DEPT OF [facility]  Discharge Instructions: Thank you for choosing [facility] to provide your oncology and hematology care.  If you have a lab appointment with the Cancer Center, please go directly to the Cancer Center and check in at the registration area.  Wear comfortable clothing and clothing appropriate for easy access to any Portacath or PICC line.   We strive to give you quality time with your provider. You may need to reschedule your appointment if you arrive late (15 or more minutes).  Arriving late affects you and other patients whose appointments are after yours.  Also, if you miss three or more appointments without notifying the office, you may be dismissed from the clinic at the provider's discretion.      For prescription refill requests, have your pharmacy contact our office and allow 72 hours for refills to be completed.    Today you received the following chemotherapy and/or immunotherapy agents- gemzar      To help prevent nausea and vomiting after your treatment, we encourage you to take your nausea medication as directed.  BELOW ARE SYMPTOMS THAT SHOULD BE REPORTED IMMEDIATELY: *FEVER GREATER THAN 100.4 F (38 C) OR HIGHER *CHILLS OR SWEATING *NAUSEA AND VOMITING THAT IS NOT CONTROLLED WITH YOUR NAUSEA MEDICATION *UNUSUAL SHORTNESS OF BREATH *UNUSUAL BRUISING OR BLEEDING *URINARY PROBLEMS (pain or burning when urinating, or frequent urination) *BOWEL PROBLEMS (unusual diarrhea, constipation, pain near the anus) TENDERNESS IN MOUTH AND THROAT WITH OR WITHOUT PRESENCE OF ULCERS (sore throat, sores in mouth, or a toothache) UNUSUAL RASH, SWELLING OR PAIN  UNUSUAL VAGINAL DISCHARGE OR ITCHING   Items with * indicate a potential emergency and should be followed up as soon as possible or go to the Emergency Department if any problems should occur.  Please show the CHEMOTHERAPY ALERT CARD or IMMUNOTHERAPY  ALERT CARD at check-in to the Emergency Department and triage nurse.  Should you have questions after your visit or need to cancel or reschedule your appointment, please contact [facility] - A DEPT OF [facility]  [phone] and follow the prompts.  Office hours are 8:00 a.m. to 4:30 p.m. Monday - Friday. Please note that voicemails left after 4:00 p.m. may not be returned until the following business day.  We are closed weekends and major holidays. You have access to a nurse at all times for urgent questions. Please call the main number to the clinic [phone] and follow the prompts.  For any non-urgent questions, you may also contact your provider using MyChart. We now offer e-Visits for anyone 59 and older to request care online for non-urgent symptoms. For details visit [email or web address].   Also download the MyChart app! Go to the app store, search "MyChart", open the app, select [place], and log in with your MyChart username and password.

## 2023-09-07 ENCOUNTER — Ambulatory Visit
Admission: RE | Admit: 2023-09-07 | Discharge: 2023-09-07 | Disposition: A | Discharge status: Home / Self Care | Source: Ambulatory Visit | Attending: Nurse Practitioner | Admitting: Nurse Practitioner

## 2023-09-07 ENCOUNTER — Other Ambulatory Visit: Payer: Self-pay | Admitting: Radiology

## 2023-09-07 ENCOUNTER — Ambulatory Visit
Admission: RE | Admit: 2023-09-07 | Discharge: 2023-09-07 | Disposition: A | Discharge status: Home / Self Care | Source: Ambulatory Visit | Attending: Radiology | Admitting: Radiology

## 2023-09-07 DIAGNOSIS — C3432 Malignant neoplasm of lower lobe, left bronchus or lung: Secondary | ICD-10-CM

## 2023-09-07 DIAGNOSIS — J90 Pleural effusion, not elsewhere classified: Secondary | ICD-10-CM | POA: Insufficient documentation

## 2023-09-07 MED ORDER — LIDOCAINE HCL (PF) 1 % IJ SOLN
10.0000 mL | Freq: Once | INTRAMUSCULAR | Status: AC
  Administered 2023-09-07: 10 mL
  Filled 2023-09-07: qty 10

## 2023-09-13 ENCOUNTER — Other Ambulatory Visit

## 2023-09-13 ENCOUNTER — Ambulatory Visit

## 2023-09-20 ENCOUNTER — Encounter: Payer: Self-pay | Admitting: Oncology

## 2023-09-20 ENCOUNTER — Inpatient Hospital Stay (HOSPITAL_BASED_OUTPATIENT_CLINIC_OR_DEPARTMENT_OTHER): Admitting: Oncology

## 2023-09-20 ENCOUNTER — Inpatient Hospital Stay

## 2023-09-20 ENCOUNTER — Other Ambulatory Visit: Payer: Self-pay

## 2023-09-20 ENCOUNTER — Inpatient Hospital Stay: Attending: Oncology

## 2023-09-20 VITALS — BP 130/85 | HR 109 | Temp 96.7°F | Resp 18 | Wt 142.0 lb

## 2023-09-20 VITALS — BP 128/79 | HR 99

## 2023-09-20 DIAGNOSIS — T451X5A Adverse effect of antineoplastic and immunosuppressive drugs, initial encounter: Secondary | ICD-10-CM | POA: Diagnosis not present

## 2023-09-20 DIAGNOSIS — Z79899 Other long term (current) drug therapy: Secondary | ICD-10-CM | POA: Diagnosis not present

## 2023-09-20 DIAGNOSIS — E785 Hyperlipidemia, unspecified: Secondary | ICD-10-CM | POA: Insufficient documentation

## 2023-09-20 DIAGNOSIS — J479 Bronchiectasis, uncomplicated: Secondary | ICD-10-CM | POA: Insufficient documentation

## 2023-09-20 DIAGNOSIS — I10 Essential (primary) hypertension: Secondary | ICD-10-CM | POA: Diagnosis not present

## 2023-09-20 DIAGNOSIS — J432 Centrilobular emphysema: Secondary | ICD-10-CM | POA: Diagnosis not present

## 2023-09-20 DIAGNOSIS — C3432 Malignant neoplasm of lower lobe, left bronchus or lung: Secondary | ICD-10-CM | POA: Diagnosis present

## 2023-09-20 DIAGNOSIS — G893 Neoplasm related pain (acute) (chronic): Secondary | ICD-10-CM | POA: Diagnosis not present

## 2023-09-20 DIAGNOSIS — J90 Pleural effusion, not elsewhere classified: Secondary | ICD-10-CM | POA: Insufficient documentation

## 2023-09-20 DIAGNOSIS — D6481 Anemia due to antineoplastic chemotherapy: Secondary | ICD-10-CM | POA: Insufficient documentation

## 2023-09-20 DIAGNOSIS — Z87891 Personal history of nicotine dependence: Secondary | ICD-10-CM | POA: Insufficient documentation

## 2023-09-20 DIAGNOSIS — I3139 Other pericardial effusion (noninflammatory): Secondary | ICD-10-CM | POA: Diagnosis not present

## 2023-09-20 DIAGNOSIS — Z5111 Encounter for antineoplastic chemotherapy: Secondary | ICD-10-CM | POA: Insufficient documentation

## 2023-09-20 DIAGNOSIS — I700 Atherosclerosis of aorta: Secondary | ICD-10-CM | POA: Insufficient documentation

## 2023-09-20 DIAGNOSIS — I2510 Atherosclerotic heart disease of native coronary artery without angina pectoris: Secondary | ICD-10-CM | POA: Insufficient documentation

## 2023-09-20 DIAGNOSIS — N179 Acute kidney failure, unspecified: Secondary | ICD-10-CM | POA: Diagnosis not present

## 2023-09-20 DIAGNOSIS — J948 Other specified pleural conditions: Secondary | ICD-10-CM | POA: Diagnosis not present

## 2023-09-20 DIAGNOSIS — K5900 Constipation, unspecified: Secondary | ICD-10-CM | POA: Insufficient documentation

## 2023-09-20 DIAGNOSIS — Z809 Family history of malignant neoplasm, unspecified: Secondary | ICD-10-CM | POA: Insufficient documentation

## 2023-09-20 LAB — CBC WITH DIFFERENTIAL (CANCER CENTER ONLY)
Abs Immature Granulocytes: 0.04 K/uL (ref 0.00–0.07)
Basophils Absolute: 0 K/uL (ref 0.0–0.1)
Basophils Relative: 0 %
Eosinophils Absolute: 0 K/uL (ref 0.0–0.5)
Eosinophils Relative: 1 %
HCT: 31.1 % — ABNORMAL LOW (ref 39.0–52.0)
Hemoglobin: 9.8 g/dL — ABNORMAL LOW (ref 13.0–17.0)
Immature Granulocytes: 1 %
Lymphocytes Relative: 8 %
Lymphs Abs: 0.6 K/uL — ABNORMAL LOW (ref 0.7–4.0)
MCH: 26.3 pg (ref 26.0–34.0)
MCHC: 31.5 g/dL (ref 30.0–36.0)
MCV: 83.4 fL (ref 80.0–100.0)
Monocytes Absolute: 0.7 K/uL (ref 0.1–1.0)
Monocytes Relative: 8 %
Neutro Abs: 6.5 K/uL (ref 1.7–7.7)
Neutrophils Relative %: 82 %
Platelet Count: 420 K/uL — ABNORMAL HIGH (ref 150–400)
RBC: 3.73 MIL/uL — ABNORMAL LOW (ref 4.22–5.81)
RDW: 18 % — ABNORMAL HIGH (ref 11.5–15.5)
WBC Count: 7.9 K/uL (ref 4.0–10.5)
nRBC: 0 % (ref 0.0–0.2)

## 2023-09-20 LAB — CMP (CANCER CENTER ONLY)
ALT: 11 U/L (ref 0–44)
AST: 19 U/L (ref 15–41)
Albumin: 2.6 g/dL — ABNORMAL LOW (ref 3.5–5.0)
Alkaline Phosphatase: 59 U/L (ref 38–126)
Anion gap: 7 (ref 5–15)
BUN: 16 mg/dL (ref 8–23)
CO2: 24 mmol/L (ref 22–32)
Calcium: 8.5 mg/dL — ABNORMAL LOW (ref 8.9–10.3)
Chloride: 105 mmol/L (ref 98–111)
Creatinine: 0.97 mg/dL (ref 0.61–1.24)
GFR, Estimated: >60 mL/min
Glucose, Bld: 222 mg/dL — ABNORMAL HIGH (ref 70–99)
Potassium: 3.2 mmol/L — ABNORMAL LOW (ref 3.5–5.1)
Sodium: 136 mmol/L (ref 135–145)
Total Bilirubin: 0.7 mg/dL (ref 0.0–1.2)
Total Protein: 7.2 g/dL (ref 6.5–8.1)

## 2023-09-20 MED ORDER — SODIUM CHLORIDE 0.9 % IV SOLN
INTRAVENOUS | Status: DC
  Filled 2023-09-20: qty 250

## 2023-09-20 MED ORDER — SODIUM CHLORIDE 0.9 % IV SOLN
INTRAVENOUS | Status: DC
Start: 2023-09-20 — End: 2023-09-20
  Filled 2023-09-20: qty 250

## 2023-09-20 MED ORDER — PROCHLORPERAZINE MALEATE 10 MG PO TABS
10.0000 mg | ORAL_TABLET | Freq: Once | ORAL | Status: AC
  Administered 2023-09-20: 10 mg via ORAL
  Filled 2023-09-20: qty 1

## 2023-09-20 MED ORDER — MORPHINE SULFATE (PF) 2 MG/ML IV SOLN
4.0000 mg | Freq: Once | INTRAVENOUS | Status: AC
  Administered 2023-09-20: 4 mg via INTRAVENOUS
  Filled 2023-09-20: qty 2

## 2023-09-20 MED ORDER — SODIUM CHLORIDE 0.9 % IV SOLN
800.0000 mg/m2 | Freq: Once | INTRAVENOUS | Status: AC
  Administered 2023-09-20: 1482 mg via INTRAVENOUS
  Filled 2023-09-20: qty 38.98

## 2023-09-20 NOTE — Patient Instructions (Signed)
 [facility] - A DEPT OF [facility]  Discharge Instructions: Thank you for choosing [facility] to provide your oncology and hematology care.  If you have a lab appointment with the Cancer Center, please go directly to the Cancer Center and check in at the registration area.  Wear comfortable clothing and clothing appropriate for easy access to any Portacath or PICC line.   We strive to give you quality time with your provider. You may need to reschedule your appointment if you arrive late (15 or more minutes).  Arriving late affects you and other patients whose appointments are after yours.  Also, if you miss three or more appointments without notifying the office, you may be dismissed from the clinic at the provider's discretion.      For prescription refill requests, have your pharmacy contact our office and allow 72 hours for refills to be completed.    Today you received the following chemotherapy and/or immunotherapy agents- gemzar      To help prevent nausea and vomiting after your treatment, we encourage you to take your nausea medication as directed.  BELOW ARE SYMPTOMS THAT SHOULD BE REPORTED IMMEDIATELY: *FEVER GREATER THAN 100.4 F (38 C) OR HIGHER *CHILLS OR SWEATING *NAUSEA AND VOMITING THAT IS NOT CONTROLLED WITH YOUR NAUSEA MEDICATION *UNUSUAL SHORTNESS OF BREATH *UNUSUAL BRUISING OR BLEEDING *URINARY PROBLEMS (pain or burning when urinating, or frequent urination) *BOWEL PROBLEMS (unusual diarrhea, constipation, pain near the anus) TENDERNESS IN MOUTH AND THROAT WITH OR WITHOUT PRESENCE OF ULCERS (sore throat, sores in mouth, or a toothache) UNUSUAL RASH, SWELLING OR PAIN  UNUSUAL VAGINAL DISCHARGE OR ITCHING   Items with * indicate a potential emergency and should be followed up as soon as possible or go to the Emergency Department if any problems should occur.  Please show the CHEMOTHERAPY ALERT CARD or IMMUNOTHERAPY  ALERT CARD at check-in to the Emergency Department and triage nurse.  Should you have questions after your visit or need to cancel or reschedule your appointment, please contact [facility] - A DEPT OF [facility]  [phone] and follow the prompts.  Office hours are 8:00 a.m. to 4:30 p.m. Monday - Friday. Please note that voicemails left after 4:00 p.m. may not be returned until the following business day.  We are closed weekends and major holidays. You have access to a nurse at all times for urgent questions. Please call the main number to the clinic [phone] and follow the prompts.  For any non-urgent questions, you may also contact your provider using MyChart. We now offer e-Visits for anyone 59 and older to request care online for non-urgent symptoms. For details visit [email or web address].   Also download the MyChart app! Go to the app store, search "MyChart", open the app, select [place], and log in with your MyChart username and password.

## 2023-09-20 NOTE — Progress Notes (Unsigned)
 Patient is very unsteady today and complains of sob. O2 at 99%

## 2023-09-21 ENCOUNTER — Encounter: Payer: Self-pay | Admitting: Oncology

## 2023-09-21 NOTE — Progress Notes (Signed)
 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  No evidence of distant metastatic disease.  Pathology was consistent with non-small cell lung cancer favor adenocarcinoma.  Cells were positive for TTF-1 and negative for p40.   He received 1 cycle of CarboTaxol and then had significant reaction to Taxol .  He was switched to Abraxane  for 3 more cycles but due to shortage of Abraxane  he was switched to carbo Alimta  for 2 cycles ending in October 2021 concurrent with radiation.  Subsequently he was started on maintenance durvalumab  which she continued until September 2022.     CT scans in October 2022 showed concern for hilar recurrence and patient was retreated with carbo Alimta  followed by maintenance Alimta  which she received until December 2023.  He has been off treatment since then due to stable disease but Alimta  was also complicated by worsening anemia and AKI.   Skains while he was on Alimta  showed mild to moderate left pleural effusion.  He has had multiple thoracentesis in the past which were all negative for malignancy. PET scan in October 2024  did not show any evidence of recurrent or progressive disease elsewhere but showed large left pleural effusion with complete left lower lobe collapse.  Also noted to have small to moderate pericardial effusion on his CT scans.    He was admitted to [facility] and underwentVATS procedure with decortication and drainage of pleural and pericardial effusion.  Cytology from thoracentesis as well as pericardial fluid was negative for malignancy.  Pericardial biopsy showed nonspecific chronic inflammation.  However his visceral pleural biopsy showed scant foci of non-small cell carcinoma only within lymphovascular space.   NGS testing back in 2021 showed no evidence of actionable mutations.  Tumor mutational burden 0.  MSI stable PD-L1 10%   He was noted to have moderate to large left pleural effusions in multiple locations along with small to moderate pericardial effusion for which she underwent thoracentesis in the past and all of them have been negative for malignancy. He was seen by CT surgery in March 2025 and underwent VATS procedure along with decortication. Pleural and pericardial fluid was again negative for malignancy. Pericardial biopsy showed no malignancy but there were foci of adenocarcinoma within the lymphovascular space noted in the visceral pleura.  He was started on third line palliative gemcitabine .  Docetaxel could not be attempted given his prior reaction to Taxol     Interval history-patient had thoracentesis done 10 days ago and reports improvement in his breathing.  He denies any worsening shortness of breath presently and he is not on home oxygen.  Currently reports right-sided chest wall pain which comes and goes and he is been using as needed oxycodone  for the same.  ECOG PS-  2 Pain scale- 0   Review of systems- Review of Systems  Constitutional:  Positive for malaise/fatigue. Negative for chills, fever and weight loss.  HENT:  Negative for congestion, ear discharge and  nosebleeds.   Eyes:  Negative for blurred vision.  Respiratory:  Negative for cough, hemoptysis, sputum production, shortness of breath and wheezing.   Cardiovascular:  Negative for chest pain, palpitations, orthopnea and claudication.  Gastrointestinal:  Negative for abdominal pain, blood in stool, constipation, diarrhea, heartburn, melena, nausea and vomiting.  Genitourinary:  Negative for dysuria, flank pain, frequency, hematuria and urgency.  Musculoskeletal:  Negative for back pain, joint pain and myalgias.  Skin:  Negative for rash.  Neurological:  Negative for dizziness, tingling, focal weakness, seizures, weakness and headaches.  Endo/Heme/Allergies:  Does not bruise/bleed easily.  Psychiatric/Behavioral:  Negative for depression and suicidal ideas. The patient does not have insomnia.       Allergies  Allergen Reactions   Taxol  [Paclitaxel ] Other (See Comments)    Chest pain, hip pain, coughing , wheezing     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 [facility])      Past Surgical History:  Procedure Laterality Date   CHEST TUBE INSERTION Right 04/25/2023   Procedure: RIGHT PLEURAL CHEST TUBE INSERTION;  Surgeon: Kerrin Elspeth BROCKS, MD;  Location: [facility] OR;  Service: Thoracic;  Laterality: Right;   ESOPHAGOGASTRODUODENOSCOPY (EGD) WITH PROPOFOL  N/A 12/01/2020   Procedure: ESOPHAGOGASTRODUODENOSCOPY (EGD) WITH PROPOFOL ;  Surgeon: Unk Corinn Skiff, MD;  Location: [facility] ENDOSCOPY;  Service: Gastroenterology;  Laterality: N/A;   IR THORACENTESIS ASP PLEURAL SPACE W/IMG GUIDE  12/26/2022   PERICARDIAL FLUID DRAINAGE N/A 04/25/2023   Procedure: DRAINAGE, PERICARDIAL EFFUSION;  Surgeon: Kerrin Elspeth BROCKS, MD;  Location: [facility] OR;  Service: Thoracic;  Laterality: N/A;   PERICARDIAL WINDOW N/A 04/25/2023   Procedure: CREATION, PERICARDIAL WINDOW;  Surgeon: Kerrin Elspeth BROCKS, MD;   Location: [facility] OR;  Service: Thoracic;  Laterality: N/A;   PLEURAL EFFUSION DRAINAGE Bilateral 04/25/2023   Procedure: DRAINAGE, PLEURAL EFFUSION;  Surgeon: Kerrin Elspeth BROCKS, MD;  Location: [facility] OR;  Service: Thoracic;  Laterality: Bilateral;   PORTA CATH INSERTION N/A 10/22/2019   Procedure: PORTA CATH INSERTION;  Surgeon: Jama Cordella MATSU, MD;  Location: [facility] INVASIVE CV LAB;  Service: Cardiovascular;  Laterality: N/A;   VIDEO ASSISTED THORACOSCOPY (VATS)/DECORTICATION Left 04/25/2023   Procedure: VIDEO ASSISTED THORACOSCOPY (VATS)/DECORTICATION;  Surgeon: Kerrin Elspeth BROCKS, MD;  Location: [facility] OR;  Service: Thoracic;  Laterality: Left;   VIDEO BRONCHOSCOPY WITH ENDOBRONCHIAL ULTRASOUND N/A 09/25/2019   Procedure: VIDEO BRONCHOSCOPY WITH ENDOBRONCHIAL ULTRASOUND;  Surgeon: Tamea Dedra CROME, MD;  Location: [facility] ORS;  Service: Pulmonary;  Laterality: N/A;    Social History   Socioeconomic History   Marital status: Single    Spouse name: Not on file   Number of children: Not on file   Years of education: Not on file   Highest education level: Not on file  Occupational History   Not on file  Tobacco Use   Smoking status: Former    Current packs/day: 0.00    Average packs/day: 1 pack/day for 20.0 years (20.0 ttl pk-yrs)    Types: Cigarettes    Start date: 14    Quit date: 68    Years since quitting: 31.6   Smokeless tobacco: Never  Vaping Use   Vaping status: Never Used  Substance  and Sexual Activity   Alcohol use: Not Currently    Comment: not drank any beer in 2 onths   Drug use: Never   Sexual activity: Not Currently  Other Topics Concern   Not on file  Social History Narrative   Not on file   Social Drivers of Health   Financial Resource Strain: Not on file  Food Insecurity: No Food Insecurity (04/23/2023)   Hunger Vital Sign    Worried About Running Out of Food in the Last Year: Never true    Ran Out of Food in the Last Year: Never true  Transportation Needs: No  Transportation Needs (04/23/2023)   PRAPARE - Administrator, Civil Service (Medical): No    Lack of Transportation (Non-Medical): No  Physical Activity: Not on file  Stress: Not on file  Social Connections: Socially Isolated (04/23/2023)   Social Connection and Isolation Panel    Frequency of Communication with Friends and Family: More than three times a week    Frequency of Social Gatherings with Friends and Family: Twice a week    Attends Religious Services: Never    Database administrator or Organizations: No    Attends Banker Meetings: Never    Marital Status: Never married  Intimate Partner Violence: Not At Risk (04/23/2023)   Humiliation, Afraid, Rape, and Kick questionnaire    Fear of Current or Ex-Partner: No    Emotionally Abused: No    Physically Abused: No    Sexually Abused: No    Family History  Problem Relation Age of Onset   Cancer Brother      Current Outpatient Medications:    acetaminophen  (TYLENOL ) 500 MG tablet, Take 500 mg by mouth every 6 (six) hours as needed for mild pain (pain score 1-3) (takes as needed for generalized pain.  Denies pain at this time.)., Disp: , Rfl:    albuterol  (VENTOLIN  HFA) 108 (90 Base) MCG/ACT inhaler, INHALE 2 PUFFS INTO THE LUNGS EVERY 4 HOURS AS NEEDED FOR WHEEZING OR SHORTNESS OF BREATH, Disp: 6.7 g, Rfl: 2   ANORO ELLIPTA  62.5-25 MCG/ACT AEPB, INHALE 1 PUFF INTO THE LUNGS DAILY, Disp: 60 each, Rfl: 2   lidocaine -prilocaine  (EMLA ) cream, Apply to affected area once, Disp: 30 g, Rfl: 3   megestrol  (MEGACE ) 40 MG tablet, Take 1 tablet (40 mg total) by mouth daily., Disp: 30 tablet, Rfl: 2   ondansetron  (ZOFRAN ) 8 MG tablet, Take 1 tablet (8 mg total) by mouth every 8 (eight) hours as needed for nausea or vomiting., Disp: 30 tablet, Rfl: 1   oxyCODONE  (OXY IR/ROXICODONE ) 5 MG immediate release tablet, Take 5 mg by mouth 3 (three) times daily as needed., Disp: , Rfl:    pantoprazole  (PROTONIX ) 20 MG tablet, Take  1 tablet (20 mg total) by mouth every morning., Disp: 30 tablet, Rfl: 2   potassium chloride  SA (KLOR-CON  M) 20 MEQ tablet, TAKE 1 TABLET(20 MEQ) BY MOUTH DAILY, Disp: 7 tablet, Rfl: 0   prochlorperazine  (COMPAZINE ) 10 MG tablet, Take 1 tablet (10 mg total) by mouth every 6 (six) hours as needed for nausea or vomiting., Disp: 30 tablet, Rfl: 1   umeclidinium-vilanterol (ANORO ELLIPTA ) 62.5-25 MCG/ACT AEPB, Inhale 1 puff into the lungs daily., Disp: 1 each, Rfl: 3  Physical exam:  Vitals:   09/20/23 1047  BP: 130/85  Pulse: (!) 109  Resp: 18  Temp: (!) 96.7 F (35.9 C)  TempSrc: Tympanic  SpO2: 99%  Weight: 142 lb (64.4  kg)   Physical Exam Constitutional:      Comments: Sitting in a wheelchair.  Appears in no acute distress  Eyes:     Pupils: Pupils are equal, round, and reactive to light.  Cardiovascular:     Rate and Rhythm: Regular rhythm. Tachycardia present.     Heart sounds: Normal heart sounds.  Pulmonary:     Effort: Pulmonary effort is normal.     Comments: Breath sounds decreased over left lung base Abdominal:     General: Bowel sounds are normal.     Palpations: Abdomen is soft.  Musculoskeletal:     Cervical back: Normal range of motion.  Skin:    General: Skin is warm and dry.  Neurological:     Mental Status: He is alert and oriented to person, place, and time.      I have personally reviewed labs listed below:    Latest Ref Rng & Units 09/20/2023   10:23 AM  CMP  Glucose 70 - 99 mg/dL 777   BUN 8 - 23 mg/dL 16   Creatinine 9.38 - 1.24 mg/dL 9.02   Sodium 864 - 854 mmol/L 136   Potassium 3.5 - 5.1 mmol/L 3.2   Chloride 98 - 111 mmol/L 105   CO2 22 - 32 mmol/L 24   Calcium 8.9 - 10.3 mg/dL 8.5   Total Protein 6.5 - 8.1 g/dL 7.2   Total Bilirubin 0.0 - 1.2 mg/dL 0.7   Alkaline Phos 38 - 126 U/L 59   AST 15 - 41 U/L 19   ALT 0 - 44 U/L 11       Latest Ref Rng & Units 09/20/2023   10:23 AM  CBC  WBC 4.0 - 10.5 K/uL 7.9   Hemoglobin 13.0 - 17.0 g/dL  9.8   Hematocrit 60.9 - 52.0 % 31.1   Platelets 150 - 400 K/uL 420    I have personally reviewed Radiology images listed below: No images are attached to the encounter.  US  THORACENTESIS ASP PLEURAL SPACE W/IMG GUIDE Result Date: 09/07/2023 INDICATION: 73 year old male with a history of left lower lobe lung cancer with recurrent pleural effusions. Request for therapeutic thoracentesis. EXAM: ULTRASOUND GUIDED RIGHT THORACENTESIS MEDICATIONS: 1% lidocaine , 6 mL. COMPLICATIONS: None immediate. PROCEDURE: An ultrasound guided thoracentesis was thoroughly discussed with the patient and questions answered. The benefits, risks, alternatives and complications were also discussed. The patient understands and wishes to proceed with the procedure. Written consent was obtained. Ultrasound was performed to localize and mark an adequate pocket of fluid in the right chest. The area was then prepped and draped in the normal sterile fashion. 1% Lidocaine  was used for local anesthesia. Under ultrasound guidance a 6 Fr Safe-T-Centesis catheter was introduced. Thoracentesis was performed. The catheter was removed and a dressing applied. FINDINGS: A total of approximately 850 mL of blood-tinged fluid was removed. IMPRESSION: Successful ultrasound guided right thoracentesis yielding 850 mL of pleural fluid. Procedure performed by: Sherrilee Bal, PA-C under the supervision of Dr. KANDICE Moan Electronically Signed   By: Marcey Moan M.D.   On: 09/07/2023 10:33   DG Chest Port 1 View Result Date: 09/07/2023 CLINICAL DATA:  History of lung carcinoma and status post right thoracentesis. EXAM: PORTABLE CHEST 1 VIEW COMPARISON:  08/23/2023 FINDINGS: Stable heart size and positioning of Port-A-Cath. No pneumothorax after right thoracentesis. No significant residual right pleural fluid. Smaller lateral loculated hydropneumothorax on the left. Stable appearance left lower lung/hilar mass. Stable additional basilar component of  pleural fluid  on the left. No pulmonary edema. IMPRESSION: 1. No pneumothorax after right thoracentesis. No significant residual right pleural fluid. 2. Smaller lateral loculated hydropneumothorax on the left. Stable appearance of left lower lung/hilar mass and additional basilar component of pleural fluid on the left. Electronically Signed   By: Marcey Moan M.D.   On: 09/07/2023 10:23   DG Chest 2 View Result Date: 08/23/2023 CLINICAL DATA:  Lung cancer.  Shortness of breath. EXAM: CHEST - 2 VIEW COMPARISON:  Chest radiograph dated 08/08/2023. FINDINGS: Right-sided Port-A-Cath in similar position. Decrease in the size of the left hydropneumothorax. Left mid to lower lung field atelectasis or infiltrate. Stable cardiac silhouette no acute osseous pathology. IMPRESSION: Decrease in the size of the left hydropneumothorax. Electronically Signed   By: Vanetta Chou M.D.   On: 08/23/2023 13:11     Assessment and plan- Patient is a 73 y.o. male with history of recurrent adenocarcinoma of the lung here for on treatment assessment prior to cycle 5-day 1 of gemcitabine  chemotherapy  Counts okay to proceed with cycle 5 Day 1 of gemcitabine  chemotherapy today.  He will directly proceed for cycle 5-day 8 next week and I will see him back in 3 weeks for cycle 6.  Patient reports improvement in his breathing since his last thoracentesis 10 days ago.  I am holding off on scheduling any standing thoracentesis at this time and he will let us  know if his breathing gets worse and we will arrange accordingly.  I am planning to get repeat scans end of next month.  Chemo induced anemia: Hemoglobin remained stable between 9.5-10.5.  Continue to monitor  Neoplasm related pain: He was last given oxycodone  back in April 2025 that he has been using sparingly and does not request any refills today.   Visit Diagnosis 1. 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)   2. Encounter for antineoplastic chemotherapy    3. Neoplasm related pain      Dr. Annah Skene, MD, MPH [facility] [phone] 09/21/2023 6:44 AM

## 2023-09-22 ENCOUNTER — Emergency Department

## 2023-09-22 ENCOUNTER — Other Ambulatory Visit: Payer: Self-pay

## 2023-09-22 ENCOUNTER — Emergency Department
Admission: EM | Admit: 2023-09-22 | Discharge: 2023-09-22 | Disposition: A | Discharge status: Home / Self Care | Attending: Emergency Medicine | Admitting: Emergency Medicine

## 2023-09-22 DIAGNOSIS — R0602 Shortness of breath: Secondary | ICD-10-CM | POA: Diagnosis present

## 2023-09-22 DIAGNOSIS — J90 Pleural effusion, not elsewhere classified: Secondary | ICD-10-CM | POA: Diagnosis not present

## 2023-09-22 DIAGNOSIS — J910 Malignant pleural effusion: Secondary | ICD-10-CM

## 2023-09-22 LAB — BASIC METABOLIC PANEL WITH GFR
Anion gap: 13 (ref 5–15)
BUN: 16 mg/dL (ref 8–23)
CO2: 22 mmol/L (ref 22–32)
Calcium: 9.1 mg/dL (ref 8.9–10.3)
Chloride: 105 mmol/L (ref 98–111)
Creatinine, Ser: 0.98 mg/dL (ref 0.61–1.24)
GFR, Estimated: >60 mL/min (ref >60)
Glucose, Bld: 96 mg/dL (ref 70–99)
Potassium: 3.9 mmol/L (ref 3.5–5.1)
Sodium: 140 mmol/L (ref 135–145)

## 2023-09-22 LAB — CBC
HCT: 35.3 % — ABNORMAL LOW (ref 39.0–52.0)
Hemoglobin: 10.7 g/dL — ABNORMAL LOW (ref 13.0–17.0)
MCH: 25.7 pg — ABNORMAL LOW (ref 26.0–34.0)
MCHC: 30.3 g/dL (ref 30.0–36.0)
MCV: 84.9 fL (ref 80.0–100.0)
Platelets: 497 K/uL — ABNORMAL HIGH (ref 150–400)
RBC: 4.16 MIL/uL — ABNORMAL LOW (ref 4.22–5.81)
RDW: 18 % — ABNORMAL HIGH (ref 11.5–15.5)
WBC: 9 K/uL (ref 4.0–10.5)
nRBC: 0 % (ref 0.0–0.2)

## 2023-09-22 MED ORDER — LIDOCAINE HCL (PF) 1 % IJ SOLN
10.0000 mL | Freq: Once | INTRAMUSCULAR | Status: AC
  Administered 2023-09-22: 10 mL via INTRADERMAL

## 2023-09-22 NOTE — ED Provider Notes (Signed)
 [facility] Provider Note    Event Date/Time   First MD Initiated Contact with Patient 09/22/23 1326     (approximate)   History   Shortness of Breath   HPI  Mitchell Frye is a 73 y.o. male who presents to the ED for evaluation of Shortness of Breath   I review oncology clinic visit from 2 days ago.  Known advanced adenocarcinoma of the right lung with pleural metastases.  Multiple thoracentesis, recently underwent VATS, pericardial window earlier this year.  Looks like most recent thoracentesis was 2 weeks ago on 7/24.  Patient presents to the ED requesting a paracentesis due to progressively worsening shortness of breath over the past week or so.  Denies any chest pain, syncopal episodes.  He is requesting that that lady doctor do it.  When I first evaluate the patient, he wait in the lobby nearly 2 hours before being brought back to her room.  He reports that he does not want to stay in just wants to leave and he can follow-up next week as he is regularly scheduled for thoracentesis.  We discussed risks and benefits and he is subsequently reable to stay for IR to perform thoracentesis.   Physical Exam   Triage Vital Signs: ED Triage Vitals [09/22/23 1109]  Encounter Vitals Group     BP 122/88     Girls Systolic BP Percentile      Girls Diastolic BP Percentile      Boys Systolic BP Percentile      Boys Diastolic BP Percentile      Pulse Rate (!) 118     Resp 20     Temp 97.7 F (36.5 C)     Temp Source Oral     SpO2 96 %     Weight 143 lb (64.9 kg)     Height 5' 10 (1.778 m)     Head Circumference      Peak Flow      Pain Score      Pain Loc      Pain Education      Exclude from Growth Chart     Most recent vital signs: Vitals:   09/22/23 1500 09/22/23 1536  BP: (!) 147/83 (!) 140/80  Pulse: (!) 121 (!) 110  Resp:  20  Temp:  98 F (36.7 C)  SpO2: 100% 100%    General: Awake, no distress.  CV:  Good peripheral perfusion.   Resp:  Mild tachypnea without distress. Abd:  No distention.  MSK:  No deformity noted.  Neuro:  No focal deficits appreciated. Other:     ED Results / Procedures / Treatments   Labs (all labs ordered are listed, but only abnormal results are displayed) Labs Reviewed  CBC - Abnormal; Notable for the following components:      Result Value   RBC 4.16 (*)    Hemoglobin 10.7 (*)    HCT 35.3 (*)    MCH 25.7 (*)    RDW 18.0 (*)    Platelets 497 (*)    All other components within normal limits  BASIC METABOLIC PANEL WITH GFR    EKG Sinus tachycardia with a rate of 120 bpm.  Leftward axis and no STEMI  RADIOLOGY 2 view CXR interpreted by me with progression of right sided pleural effusion in addition to chronic moderate left-sided pleural effusion Repeat CXR after IR thoracentesis interpreted by me with improvement of his right-sided pleural effusion  Official radiology report(s):  US  THORACENTESIS ASP PLEURAL SPACE W/IMG GUIDE Result Date: 09/22/2023 INDICATION: 73 year old male with a history of left lower lobe lung cancer with recurrent pleural effusions who presented to the ED with worsening shortness of breath. Request for therapeutic thoracentesis. EXAM: ULTRASOUND GUIDED RIGHT THORACENTESIS MEDICATIONS: 1% lidocaine , 7 mL. COMPLICATIONS: None immediate. PROCEDURE: An ultrasound guided thoracentesis was thoroughly discussed with the patient and questions answered. The benefits, risks, alternatives and complications were also discussed. The patient understands and wishes to proceed with the procedure. Written consent was obtained. Ultrasound was performed to localize and mark an adequate pocket of fluid in the right chest. The area was then prepped and draped in the normal sterile fashion. 1% Lidocaine  was used for local anesthesia. Under ultrasound guidance a 6 Fr Safe-T-Centesis catheter was introduced. Thoracentesis was performed. The catheter was removed and a dressing applied.  FINDINGS: A total of approximately 500 mL of bloody fluid was removed. IMPRESSION: Successful ultrasound guided right thoracentesis yielding 500 mL of pleural fluid. Procedure performed by: Sherrilee Bal, PA-C under the supervision of Dr. DELENA Balder Electronically Signed   By: Juliene Balder M.D.   On: 09/22/2023 16:41   DG Chest Port 1 View Result Date: 09/22/2023 CLINICAL DATA:  Status post right thoracentesis. EXAM: PORTABLE CHEST 1 VIEW COMPARISON:  Earlier today. FINDINGS: Small to moderate-sized right pleural effusion with a decrease in amount. No pneumothorax. Stable partially loculated moderate-sized left pleural effusion and perihilar density. Stable right jugular porta catheter. Unremarkable bones. IMPRESSION: 1. Small to moderate-sized right pleural effusion with a decrease in amount. No pneumothorax. 2. Stable partially loculated moderate-sized left pleural effusion and perihilar density. Electronically Signed   By: Elspeth Bathe M.D.   On: 09/22/2023 15:47   DG Chest 2 View Result Date: 09/22/2023 CLINICAL DATA:  Shortness of breath. EXAM: CHEST - 2 VIEW COMPARISON:  09/07/2023 FINDINGS: Right IJ Port-A-Cath unchanged. Moderate size left effusion unchanged. Stable left perihilar opacification. Interval worsening moderate size right pleural effusion. Likely associated right basilar atelectasis. Cardiomediastinal silhouette and remainder of the exam is unchanged. IMPRESSION: 1. Interval worsening moderate size right pleural effusion with likely associated right basilar atelectasis. 2. Stable moderate size left effusion. Stable left perihilar opacification. Electronically Signed   By: Toribio Agreste M.D.   On: 09/22/2023 11:53    PROCEDURES and INTERVENTIONS:  Procedures  Medications  lidocaine  (PF) (XYLOCAINE ) 1 % injection 10 mL (10 mLs Intradermal Given 09/22/23 1532)     IMPRESSION / MDM / ASSESSMENT AND PLAN / ED COURSE  I reviewed the triage vital signs and the nursing notes.  Differential  diagnosis includes, but is not limited to, PE, ACS, pneumonia, pleural effusion, pneumothorax  {Patient presents with symptoms of an acute illness or injury that is potentially life-threatening.  Patient presents with worsening dyspnea associated with enlarging malignant pleural effusion from his known adenocarcinoma.  Consult with IR who drained 500 cc with resolution of his symptoms.  Patient is appreciative and suitable for outpatient management.  Normal metabolic panel, mild normocytic anemia.  I considered observation admission for this patient.  ACS and PE much less likely  Clinical Course as of 09/22/23 1923  Fri Sep 22, 2023  1401 I consult with Dr. Balder with IR, happy to help. I place US  thora order [DS]  1606 Reassessed after he got back from IR, he reports feeling much better and is appreciative.  Continues to deny chest pain. [DS]    Clinical Course User Index [DS] Claudene Rover, MD  FINAL CLINICAL IMPRESSION(S) / ED DIAGNOSES   Final diagnoses:  Shortness of breath  Pleural effusion  Malignant pleural effusion     Rx / DC Orders   ED Discharge Orders     None        Note:  This document was prepared using Dragon voice recognition software and may include unintentional dictation errors.   Claudene Rover, MD 09/22/23 [phone]

## 2023-09-22 NOTE — ED Notes (Signed)
 See triage note  Presents with some SOB  States this started last week   States he has had fluid drain from lungs in past  Feels like he need it again Grunting on arrival but able to speak full sentences

## 2023-09-22 NOTE — ED Triage Notes (Signed)
 Pt to ED from home w/ SOB for the last week. Pt states he feels he has fluid on his lungs. Pt is grunting on expiration but able to speak in complete sentences in triage. States last dialysis treatment was Wednesday.

## 2023-09-22 NOTE — Procedures (Signed)
 PROCEDURE SUMMARY:  Successful US  guided right thoracentesis. Yielded of bloody fluid. Patient tolerated procedure well. No immediate complications. EBL = trace  Post procedure chest X-ray reveals no pneumothorax  Arlan Birks CHRISTELLA Bal PA-C 09/22/2023 4:04 PM

## 2023-09-22 NOTE — ED Notes (Signed)
Taken to US via WC

## 2023-09-23 ENCOUNTER — Emergency Department
Admission: EM | Admit: 2023-09-23 | Discharge: 2023-09-23 | Disposition: A | Discharge status: Home / Self Care | Attending: Emergency Medicine | Admitting: Emergency Medicine

## 2023-09-23 ENCOUNTER — Other Ambulatory Visit: Payer: Self-pay

## 2023-09-23 ENCOUNTER — Encounter: Payer: Self-pay | Admitting: Radiology

## 2023-09-23 ENCOUNTER — Emergency Department

## 2023-09-23 DIAGNOSIS — R0602 Shortness of breath: Secondary | ICD-10-CM | POA: Insufficient documentation

## 2023-09-23 DIAGNOSIS — Z85118 Personal history of other malignant neoplasm of bronchus and lung: Secondary | ICD-10-CM | POA: Diagnosis not present

## 2023-09-23 DIAGNOSIS — Z992 Dependence on renal dialysis: Secondary | ICD-10-CM | POA: Insufficient documentation

## 2023-09-23 DIAGNOSIS — I120 Hypertensive chronic kidney disease with stage 5 chronic kidney disease or end stage renal disease: Secondary | ICD-10-CM | POA: Diagnosis not present

## 2023-09-23 DIAGNOSIS — N186 End stage renal disease: Secondary | ICD-10-CM | POA: Insufficient documentation

## 2023-09-23 DIAGNOSIS — R0600 Dyspnea, unspecified: Secondary | ICD-10-CM

## 2023-09-23 LAB — CBC
HCT: 34.1 % — ABNORMAL LOW (ref 39.0–52.0)
Hemoglobin: 10.4 g/dL — ABNORMAL LOW (ref 13.0–17.0)
MCH: 25.9 pg — ABNORMAL LOW (ref 26.0–34.0)
MCHC: 30.5 g/dL (ref 30.0–36.0)
MCV: 84.8 fL (ref 80.0–100.0)
Platelets: 499 K/uL — ABNORMAL HIGH (ref 150–400)
RBC: 4.02 MIL/uL — ABNORMAL LOW (ref 4.22–5.81)
RDW: 17.9 % — ABNORMAL HIGH (ref 11.5–15.5)
WBC: 8 K/uL (ref 4.0–10.5)
nRBC: 0 % (ref 0.0–0.2)

## 2023-09-23 LAB — BASIC METABOLIC PANEL WITH GFR
Anion gap: 9 (ref 5–15)
BUN: 22 mg/dL (ref 8–23)
CO2: 23 mmol/L (ref 22–32)
Calcium: 8.8 mg/dL — ABNORMAL LOW (ref 8.9–10.3)
Chloride: 104 mmol/L (ref 98–111)
Creatinine, Ser: 1.23 mg/dL (ref 0.61–1.24)
GFR, Estimated: >60 mL/min (ref >60)
Glucose, Bld: 221 mg/dL — ABNORMAL HIGH (ref 70–99)
Potassium: 3.5 mmol/L (ref 3.5–5.1)
Sodium: 136 mmol/L (ref 135–145)

## 2023-09-23 NOTE — ED Provider Notes (Addendum)
 [facility] Provider Note    Event Date/Time   First MD Initiated Contact with Patient 09/23/23 [phone]     (approximate)  History   Chief Complaint: Shortness of Breath  HPI  Mitchell Frye is a 73 y.o. male with a past medical history of hypertension, hyperlipidemia, lung cancer, ESRD on HD presents to the emergency department for shortness of breath.  According to EMS report there was a dispute at the house this morning which got police involved.  Police noted that the patient appeared to be short of breath so they called EMS to transport the patient to the emergency department.  Per EMS report they state this is typically how the patient breathes.  Patient was seen in the emergency department yesterday for the same had a negative workup was ultimately discharged home.  Patient states his last dialysis session was on Wednesday.  No chest pain.  No fever.  Patient does make a grunting type noise with each expiration, however again in report this was stated that this is typical.  Physical Exam   Triage Vital Signs: ED Triage Vitals [09/23/23 1037]  Encounter Vitals Group     BP      Girls Systolic BP Percentile      Girls Diastolic BP Percentile      Boys Systolic BP Percentile      Boys Diastolic BP Percentile      Pulse      Resp      Temp (!) 96.9 F (36.1 C)     Temp Source Axillary     SpO2      Weight 145 lb (65.8 kg)     Height 5' 10 (1.778 m)     Head Circumference      Peak Flow      Pain Score      Pain Loc      Pain Education      Exclude from Growth Chart     Most recent vital signs: Vitals:   09/23/23 1037  Temp: (!) 96.9 F (36.1 C)    General: Awake, no distress.  Frail-appearing/cachectic CV:  Good peripheral perfusion.  Regular rate and rhythm  Resp:  Grunting type expiration with mild tachypnea equal breath sounds bilaterally Abd:  No distention.  Soft, nontender.  No rebound or guarding.  ED Results / Procedures /  Treatments   RADIOLOGY  I have reviewed interpreted chest x-ray images.  Patient has significant opacification of the left lung with pleural effusion in the right lung.  This appears unchanged from yesterday.   MEDICATIONS ORDERED IN ED: Medications - No data to display   IMPRESSION / MDM / ASSESSMENT AND PLAN / ED COURSE  I reviewed the triage vital signs and the nursing notes.  Patient's presentation is most consistent with acute presentation with potential threat to life or bodily function.  Patient presents to the emergency department for shortness of breath.  EMS was called by police as they responded to a dispute at the house and thought the patient was short of breath so EMS brought him to the emergency department.  EMS states they know the patient and this is typically how he breathes.  Patient does state some shortness of breath but does admit this is chronic.  Denies any chest pain.  Patient is on dialysis last dialyzed on Wednesday we will check labs today we will continue to closely monitor.  Patient's lab work shows a reassuring CBC, reassuring and unchanged  chemistry.  Chest x-ray shows no change.  Chemistry does not appear to show any signs of requiring dialysis emergently.  Patient scheduled paralysis on Monday.  Given the patient's reassuring workup I believe the patient will be safe for discharge home.  Will attempt to contact family to confirm they are comfortable with this.  Patient satting 100% on room air.  Speaks in full sentences.  He has no complaints.  States he is ready to go home.  FINAL CLINICAL IMPRESSION(S) / ED DIAGNOSES   Dyspnea   Note:  This document was prepared using Dragon voice recognition software and may include unintentional dictation errors.   Dorothyann Drivers, MD 09/23/23 1336    Dorothyann Drivers, MD 09/23/23 1339

## 2023-09-23 NOTE — ED Triage Notes (Signed)
 DV lives with sister. Pt has lung cancer was in hospital yesterday and has fluid removed from his lungs and released. Pt back today due to shortness of breath. Sister told EMS that the way he is breathing now is his normal.   48 117/86 120

## 2023-09-23 NOTE — ED Notes (Signed)
 Pt constantly grunting except when he talks on the phone.

## 2023-09-23 NOTE — ED Notes (Signed)
 Pt unable to get in touch with his sister who will be his ride home.

## 2023-09-23 NOTE — ED Triage Notes (Signed)
 Pt last had chemo on Wednesday.

## 2023-09-23 NOTE — Discharge Instructions (Addendum)
 Please follow-up with your doctor within the next 2 days for recheck/reevaluation.  Please go to your next scheduled dialysis session.  Return to the emergency department for any worsening trouble breathing or any other symptom personally concerning to yourself.

## 2023-09-25 ENCOUNTER — Other Ambulatory Visit: Payer: Self-pay | Admitting: *Deleted

## 2023-09-25 DIAGNOSIS — J90 Pleural effusion, not elsewhere classified: Secondary | ICD-10-CM

## 2023-09-25 NOTE — Progress Notes (Signed)
 Per Dr. Melanee, pt needs to be scheduled for thoracentesis 10 days after last thoracentesis which was on 8/8. Order placed and will arrange next thora around 8/18. Pt has upcoming appt this week and appt will be reviewed with him at that appt.

## 2023-09-27 ENCOUNTER — Other Ambulatory Visit: Payer: Self-pay

## 2023-09-27 ENCOUNTER — Encounter: Payer: Self-pay | Admitting: Oncology

## 2023-09-27 ENCOUNTER — Inpatient Hospital Stay (HOSPITAL_BASED_OUTPATIENT_CLINIC_OR_DEPARTMENT_OTHER): Admitting: Oncology

## 2023-09-27 ENCOUNTER — Emergency Department
Admission: EM | Admit: 2023-09-27 | Discharge: 2023-09-27 | Discharge status: Against Medical Advice | Source: Ambulatory Visit | Attending: Emergency Medicine | Admitting: Emergency Medicine

## 2023-09-27 ENCOUNTER — Ambulatory Visit: Admitting: Oncology

## 2023-09-27 ENCOUNTER — Ambulatory Visit

## 2023-09-27 ENCOUNTER — Inpatient Hospital Stay

## 2023-09-27 ENCOUNTER — Other Ambulatory Visit

## 2023-09-27 ENCOUNTER — Emergency Department

## 2023-09-27 VITALS — BP 119/75 | HR 108 | Temp 96.6°F | Resp 19 | Ht 70.0 in | Wt 142.0 lb

## 2023-09-27 DIAGNOSIS — C3490 Malignant neoplasm of unspecified part of unspecified bronchus or lung: Secondary | ICD-10-CM | POA: Insufficient documentation

## 2023-09-27 DIAGNOSIS — C3432 Malignant neoplasm of lower lobe, left bronchus or lung: Secondary | ICD-10-CM

## 2023-09-27 DIAGNOSIS — R0789 Other chest pain: Secondary | ICD-10-CM | POA: Insufficient documentation

## 2023-09-27 DIAGNOSIS — Z5321 Procedure and treatment not carried out due to patient leaving prior to being seen by health care provider: Secondary | ICD-10-CM | POA: Insufficient documentation

## 2023-09-27 DIAGNOSIS — R072 Precordial pain: Secondary | ICD-10-CM

## 2023-09-27 DIAGNOSIS — J90 Pleural effusion, not elsewhere classified: Secondary | ICD-10-CM

## 2023-09-27 LAB — CMP (CANCER CENTER ONLY)
ALT: 15 U/L (ref 0–44)
AST: 22 U/L (ref 15–41)
Albumin: 2.4 g/dL — ABNORMAL LOW (ref 3.5–5.0)
Alkaline Phosphatase: 60 U/L (ref 38–126)
Anion gap: 8 (ref 5–15)
BUN: 12 mg/dL (ref 8–23)
CO2: 24 mmol/L (ref 22–32)
Calcium: 8.7 mg/dL — ABNORMAL LOW (ref 8.9–10.3)
Chloride: 105 mmol/L (ref 98–111)
Creatinine: 0.86 mg/dL (ref 0.61–1.24)
GFR, Estimated: >60 mL/min (ref >60)
Glucose, Bld: 148 mg/dL — ABNORMAL HIGH (ref 70–99)
Potassium: 3.2 mmol/L — ABNORMAL LOW (ref 3.5–5.1)
Sodium: 137 mmol/L (ref 135–145)
Total Bilirubin: 0.6 mg/dL (ref 0.0–1.2)
Total Protein: 7.2 g/dL (ref 6.5–8.1)

## 2023-09-27 LAB — CBC WITH DIFFERENTIAL (CANCER CENTER ONLY)
Abs Immature Granulocytes: 0.02 K/uL (ref 0.00–0.07)
Basophils Absolute: 0 K/uL (ref 0.0–0.1)
Basophils Relative: 1 %
Eosinophils Absolute: 0 K/uL (ref 0.0–0.5)
Eosinophils Relative: 0 %
HCT: 29.2 % — ABNORMAL LOW (ref 39.0–52.0)
Hemoglobin: 9 g/dL — ABNORMAL LOW (ref 13.0–17.0)
Immature Granulocytes: 1 %
Lymphocytes Relative: 16 %
Lymphs Abs: 0.6 K/uL — ABNORMAL LOW (ref 0.7–4.0)
MCH: 25.8 pg — ABNORMAL LOW (ref 26.0–34.0)
MCHC: 30.8 g/dL (ref 30.0–36.0)
MCV: 83.7 fL (ref 80.0–100.0)
Monocytes Absolute: 0.6 K/uL (ref 0.1–1.0)
Monocytes Relative: 17 %
Neutro Abs: 2.3 K/uL (ref 1.7–7.7)
Neutrophils Relative %: 65 %
Platelet Count: 379 K/uL (ref 150–400)
RBC: 3.49 MIL/uL — ABNORMAL LOW (ref 4.22–5.81)
RDW: 17.7 % — ABNORMAL HIGH (ref 11.5–15.5)
WBC Count: 3.5 K/uL — ABNORMAL LOW (ref 4.0–10.5)
nRBC: 0.6 % — ABNORMAL HIGH (ref 0.0–0.2)

## 2023-09-27 LAB — CBC
HCT: 29.5 % — ABNORMAL LOW (ref 39.0–52.0)
Hemoglobin: 9.2 g/dL — ABNORMAL LOW (ref 13.0–17.0)
MCH: 26.5 pg (ref 26.0–34.0)
MCHC: 31.2 g/dL (ref 30.0–36.0)
MCV: 85 fL (ref 80.0–100.0)
Platelets: 338 K/uL (ref 150–400)
RBC: 3.47 MIL/uL — ABNORMAL LOW (ref 4.22–5.81)
RDW: 17.7 % — ABNORMAL HIGH (ref 11.5–15.5)
WBC: 3.6 K/uL — ABNORMAL LOW (ref 4.0–10.5)
nRBC: 0 % (ref 0.0–0.2)

## 2023-09-27 LAB — BASIC METABOLIC PANEL WITH GFR
Anion gap: 7 (ref 5–15)
BUN: 13 mg/dL (ref 8–23)
CO2: 25 mmol/L (ref 22–32)
Calcium: 8.7 mg/dL — ABNORMAL LOW (ref 8.9–10.3)
Chloride: 106 mmol/L (ref 98–111)
Creatinine, Ser: 0.86 mg/dL (ref 0.61–1.24)
GFR, Estimated: >60 mL/min (ref >60)
Glucose, Bld: 100 mg/dL — ABNORMAL HIGH (ref 70–99)
Potassium: 3.4 mmol/L — ABNORMAL LOW (ref 3.5–5.1)
Sodium: 138 mmol/L (ref 135–145)

## 2023-09-27 LAB — TROPONIN I (HIGH SENSITIVITY): Troponin I (High Sensitivity): 9 ng/L (ref <18)

## 2023-09-27 MED ORDER — OXYCODONE HCL 5 MG PO TABS: 5.0000 mg | ORAL_TABLET | Freq: Four times a day (QID) | ORAL | 0 refills | Status: DC | PRN

## 2023-09-27 MED ORDER — MORPHINE SULFATE (PF) 2 MG/ML IV SOLN: 2.0000 mg | Freq: Once | INTRAVENOUS | Status: DC

## 2023-09-27 MED ORDER — MORPHINE SULFATE (PF) 2 MG/ML IV SOLN
2.0000 mg | Freq: Once | INTRAVENOUS | Status: AC
  Administered 2023-09-27 (×2): 2 mg via INTRAVENOUS
  Filled 2023-09-27: qty 1

## 2023-09-27 NOTE — ED Notes (Addendum)
 Pt to the First Nurse Desk stating that he wanted to go back to the cancer center to be seen, explained that the cancer center sent him over here to be seen and wanted him to stay and be seen by an EDP. Attempted to reiterate multiple times. Pt states, no, I have already seen by my doctor and now I need to get back to the cancer center. Don't worry, I will go back there myself. Pt walked out of the facility with steady gait.

## 2023-09-27 NOTE — Progress Notes (Signed)
 Patient left ED on his own accord.  Spoke with patient who was already outside in his vehicle; reviewed that the recommendation per Dr. Melanee was to report to the ED for further evaluation. Patient stated I'm tired I'm going home.  Informed patient his decision to stay or go to the ER is completely up to him; reminded [facility] arrival time 10am.  Patient confirmed he would be there.  Patient does not report any chest pain / discomfort at this time; reminded a script was sent for Oxycodone  to his pharmacy in the event pain is to return.  Reminded for any new and / or worsening symptoms to contact the clinic and/or report to the ER.

## 2023-09-27 NOTE — Progress Notes (Signed)
 Scanner not working on infusion nor OV encounter.  Dr. Melanee in room while morphine  given via port MY ,RN

## 2023-09-27 NOTE — Progress Notes (Signed)
 Thoracentesis moved up to tomorrow 8/14 arrival time 9:30am.  Requesting emetrol for gas pain.  Says more short winded than he used to be. Recently went to ER 8/9.  Says everytime he eats something he feels a nasty feeling in his stomach.  throat full of spit and gags if he gets up too fast

## 2023-09-27 NOTE — ED Provider Triage Note (Signed)
 Emergency Medicine Provider Triage Evaluation Note  Mitchell Frye , a 73 y.o. male  was evaluated in triage.  Pt complains of chest tightness and increasing shortness of breath.  Patient is currently being treated for lung cancer and was sent to the ER by oncology.  He is scheduled for thoracentesis tomorrow.  He reports increased and gas pain upon standing and feels that his throat fills up with spit.  He denies nausea, vomiting, diarrhea, or abdominal pain.  No suspected fever..  Physical Exam  BP 115/70 (BP Location: Left Arm)   Pulse 96   Temp (!) 97.4 F (36.3 C) (Oral)   Resp 20   SpO2 98%  Gen:   Awake, no distress   Resp:  Normal effort.  Speaking in full sentences.  No respiratory distress. MSK:   Moves extremities without difficulty  Other:    Medical Decision Making  Medically screening exam initiated at 12:09 PM.  Appropriate orders placed.  Mitchell Frye was informed that the remainder of the evaluation will be completed by another provider, this initial triage assessment does not replace that evaluation, and the importance of remaining in the ED until their evaluation is complete.  Chest pain workup started   Mitchell Frye, [place] 09/27/23 1826

## 2023-09-27 NOTE — ED Triage Notes (Signed)
 Patient sent over from the cancer center; states chest tightness for weeks; currently on chemo every Wednesday for lung cancer.

## 2023-09-27 NOTE — Progress Notes (Signed)
 Called report to Opp in ER.

## 2023-09-28 ENCOUNTER — Ambulatory Visit
Admission: RE | Admit: 2023-09-28 | Discharge: 2023-09-28 | Disposition: A | Discharge status: Home / Self Care | Source: Ambulatory Visit | Attending: Radiology | Admitting: Radiology

## 2023-09-28 ENCOUNTER — Ambulatory Visit
Admission: RE | Admit: 2023-09-28 | Discharge: 2023-09-28 | Disposition: A | Discharge status: Home / Self Care | Source: Ambulatory Visit | Attending: Oncology | Admitting: Oncology

## 2023-09-28 ENCOUNTER — Other Ambulatory Visit: Payer: Self-pay | Admitting: Radiology

## 2023-09-28 DIAGNOSIS — R918 Other nonspecific abnormal finding of lung field: Secondary | ICD-10-CM | POA: Diagnosis not present

## 2023-09-28 DIAGNOSIS — J90 Pleural effusion, not elsewhere classified: Secondary | ICD-10-CM | POA: Insufficient documentation

## 2023-09-28 MED ORDER — LIDOCAINE HCL (PF) 1 % IJ SOLN
10.0000 mL | Freq: Once | INTRAMUSCULAR | Status: AC
  Administered 2023-09-28: 10 mL via INTRADERMAL

## 2023-09-28 NOTE — Procedures (Signed)
 PROCEDURE SUMMARY:  Successful US  guided right thoracentesis. Yielded of bloody fluid. Patient tolerated procedure well. No immediate complications. EBL = trace  Post procedure chest X-ray reveals no pneumothorax  Kamal Jurgens CHRISTELLA Bal PA-C 09/28/2023 11:44 AM

## 2023-10-01 ENCOUNTER — Other Ambulatory Visit: Payer: Self-pay

## 2023-10-01 ENCOUNTER — Encounter: Payer: Self-pay | Admitting: Intensive Care

## 2023-10-01 ENCOUNTER — Encounter: Payer: Self-pay | Admitting: Oncology

## 2023-10-01 ENCOUNTER — Emergency Department

## 2023-10-01 ENCOUNTER — Observation Stay

## 2023-10-01 ENCOUNTER — Inpatient Hospital Stay
Admission: EM | Admit: 2023-10-01 | Discharge: 2023-10-06 | DRG: 180 | Disposition: A | Discharge status: Home / Self Care | Attending: Internal Medicine | Admitting: Internal Medicine

## 2023-10-01 DIAGNOSIS — R0609 Other forms of dyspnea: Secondary | ICD-10-CM | POA: Diagnosis not present

## 2023-10-01 DIAGNOSIS — Z6820 Body mass index (BMI) 20.0-20.9, adult: Secondary | ICD-10-CM

## 2023-10-01 DIAGNOSIS — D630 Anemia in neoplastic disease: Secondary | ICD-10-CM | POA: Diagnosis present

## 2023-10-01 DIAGNOSIS — C7802 Secondary malignant neoplasm of left lung: Secondary | ICD-10-CM | POA: Diagnosis not present

## 2023-10-01 DIAGNOSIS — C7800 Secondary malignant neoplasm of unspecified lung: Secondary | ICD-10-CM

## 2023-10-01 DIAGNOSIS — Z79899 Other long term (current) drug therapy: Secondary | ICD-10-CM

## 2023-10-01 DIAGNOSIS — I10 Essential (primary) hypertension: Secondary | ICD-10-CM | POA: Diagnosis present

## 2023-10-01 DIAGNOSIS — E43 Unspecified severe protein-calorie malnutrition: Secondary | ICD-10-CM | POA: Diagnosis present

## 2023-10-01 DIAGNOSIS — J939 Pneumothorax, unspecified: Secondary | ICD-10-CM | POA: Diagnosis present

## 2023-10-01 DIAGNOSIS — E785 Hyperlipidemia, unspecified: Secondary | ICD-10-CM | POA: Diagnosis present

## 2023-10-01 DIAGNOSIS — C801 Malignant (primary) neoplasm, unspecified: Secondary | ICD-10-CM | POA: Diagnosis present

## 2023-10-01 DIAGNOSIS — Z87891 Personal history of nicotine dependence: Secondary | ICD-10-CM

## 2023-10-01 DIAGNOSIS — E559 Vitamin D deficiency, unspecified: Secondary | ICD-10-CM | POA: Diagnosis present

## 2023-10-01 DIAGNOSIS — J90 Pleural effusion, not elsewhere classified: Secondary | ICD-10-CM | POA: Diagnosis not present

## 2023-10-01 DIAGNOSIS — Z888 Allergy status to other drugs, medicaments and biological substances status: Secondary | ICD-10-CM

## 2023-10-01 DIAGNOSIS — J9601 Acute respiratory failure with hypoxia: Secondary | ICD-10-CM | POA: Diagnosis not present

## 2023-10-01 DIAGNOSIS — J479 Bronchiectasis, uncomplicated: Secondary | ICD-10-CM | POA: Diagnosis present

## 2023-10-01 DIAGNOSIS — D75839 Thrombocytosis, unspecified: Secondary | ICD-10-CM | POA: Diagnosis present

## 2023-10-01 DIAGNOSIS — J910 Malignant pleural effusion: Secondary | ICD-10-CM | POA: Diagnosis present

## 2023-10-01 DIAGNOSIS — Z9221 Personal history of antineoplastic chemotherapy: Secondary | ICD-10-CM

## 2023-10-01 DIAGNOSIS — R590 Localized enlarged lymph nodes: Secondary | ICD-10-CM | POA: Diagnosis present

## 2023-10-01 LAB — MAGNESIUM: Magnesium: 2.2 mg/dL (ref 1.7–2.4)

## 2023-10-01 LAB — BASIC METABOLIC PANEL WITH GFR
Anion gap: 14 (ref 5–15)
BUN: 12 mg/dL (ref 8–23)
CO2: 24 mmol/L (ref 22–32)
Calcium: 9.1 mg/dL (ref 8.9–10.3)
Chloride: 102 mmol/L (ref 98–111)
Creatinine, Ser: 0.87 mg/dL (ref 0.61–1.24)
GFR, Estimated: >60 mL/min (ref >60)
Glucose, Bld: 110 mg/dL — ABNORMAL HIGH (ref 70–99)
Potassium: 3.5 mmol/L (ref 3.5–5.1)
Sodium: 140 mmol/L (ref 135–145)

## 2023-10-01 LAB — CBC
HCT: 34.9 % — ABNORMAL LOW (ref 39.0–52.0)
Hemoglobin: 10.7 g/dL — ABNORMAL LOW (ref 13.0–17.0)
MCH: 26 pg (ref 26.0–34.0)
MCHC: 30.7 g/dL (ref 30.0–36.0)
MCV: 84.7 fL (ref 80.0–100.0)
Platelets: 441 K/uL — ABNORMAL HIGH (ref 150–400)
RBC: 4.12 MIL/uL — ABNORMAL LOW (ref 4.22–5.81)
RDW: 17.9 % — ABNORMAL HIGH (ref 11.5–15.5)
WBC: 9.4 K/uL (ref 4.0–10.5)
nRBC: 0 % (ref 0.0–0.2)

## 2023-10-01 LAB — TROPONIN I (HIGH SENSITIVITY)
Troponin I (High Sensitivity): 13 ng/L (ref <18)
Troponin I (High Sensitivity): 12 ng/L (ref <18)

## 2023-10-01 LAB — PHOSPHORUS: Phosphorus: 3 mg/dL (ref 2.5–4.6)

## 2023-10-01 MED ORDER — ENSURE PLUS HIGH PROTEIN PO LIQD
237.0000 mL | Freq: Two times a day (BID) | ORAL | Status: DC
  Administered 2023-10-03 (×2): 237 mL via ORAL

## 2023-10-01 MED ORDER — IPRATROPIUM-ALBUTEROL 0.5-2.5 (3) MG/3ML IN SOLN
3.0000 mL | Freq: Four times a day (QID) | RESPIRATORY_TRACT | Status: DC | PRN
  Administered 2023-10-05: 3 mL via RESPIRATORY_TRACT
  Filled 2023-10-01: qty 3

## 2023-10-01 MED ORDER — MORPHINE SULFATE (PF) 4 MG/ML IV SOLN
4.0000 mg | Freq: Once | INTRAVENOUS | Status: AC
  Administered 2023-10-01: 4 mg via INTRAVENOUS
  Filled 2023-10-01: qty 1

## 2023-10-01 MED ORDER — SODIUM CHLORIDE 0.9 % IV SOLN: 250.0000 mL | INTRAVENOUS | Status: AC | PRN

## 2023-10-01 MED ORDER — ENOXAPARIN SODIUM 40 MG/0.4ML IJ SOSY
40.0000 mg | PREFILLED_SYRINGE | INTRAMUSCULAR | Status: DC
  Administered 2023-10-01 – 2023-10-05 (×5): 40 mg via SUBCUTANEOUS
  Filled 2023-10-01 (×6): qty 0.4

## 2023-10-01 MED ORDER — ACETAMINOPHEN 325 MG PO TABS: 650.0000 mg | ORAL_TABLET | Freq: Four times a day (QID) | ORAL | Status: DC | PRN

## 2023-10-01 MED ORDER — SODIUM CHLORIDE 0.9% FLUSH
3.0000 mL | Freq: Two times a day (BID) | INTRAVENOUS | Status: DC
  Administered 2023-10-01 – 2023-10-06 (×11): 3 mL via INTRAVENOUS

## 2023-10-01 MED ORDER — MORPHINE SULFATE (PF) 2 MG/ML IV SOLN
2.0000 mg | INTRAVENOUS | Status: DC | PRN
  Administered 2023-10-01 – 2023-10-03 (×9): 2 mg via INTRAVENOUS
  Filled 2023-10-01 (×9): qty 1

## 2023-10-01 MED ORDER — OXYCODONE HCL 5 MG PO TABS
5.0000 mg | ORAL_TABLET | Freq: Four times a day (QID) | ORAL | Status: DC | PRN
  Administered 2023-10-02: 5 mg via ORAL
  Filled 2023-10-01: qty 1

## 2023-10-01 MED ORDER — POLYETHYLENE GLYCOL 3350 17 G PO PACK
17.0000 g | PACK | Freq: Every day | ORAL | Status: DC | PRN
Start: 2023-10-01 — End: 2023-10-06
  Administered 2023-10-05: 17 g via ORAL
  Filled 2023-10-01: qty 1

## 2023-10-01 MED ORDER — BISACODYL 10 MG RE SUPP
10.0000 mg | Freq: Every day | RECTAL | Status: DC | PRN
Start: 2023-10-01 — End: 2023-10-06

## 2023-10-01 MED ORDER — ACETAMINOPHEN 650 MG RE SUPP: 650.0000 mg | Freq: Four times a day (QID) | RECTAL | Status: DC | PRN

## 2023-10-01 MED ORDER — SODIUM CHLORIDE 0.9% FLUSH
3.0000 mL | INTRAVENOUS | Status: DC | PRN
Start: 2023-10-01 — End: 2023-10-06

## 2023-10-01 MED ORDER — UMECLIDINIUM-VILANTEROL 62.5-25 MCG/ACT IN AEPB
1.0000 | INHALATION_SPRAY | Freq: Every day | RESPIRATORY_TRACT | Status: DC
  Administered 2023-10-02 – 2023-10-06 (×5): 1 via RESPIRATORY_TRACT
  Filled 2023-10-01 (×2): qty 14

## 2023-10-01 MED ORDER — IOHEXOL 350 MG/ML SOLN
75.0000 mL | Freq: Once | INTRAVENOUS | Status: AC | PRN
  Administered 2023-10-01: 75 mL via INTRAVENOUS

## 2023-10-01 MED ORDER — ONDANSETRON HCL 4 MG/2ML IJ SOLN: 4.0000 mg | Freq: Four times a day (QID) | INTRAMUSCULAR | Status: DC | PRN

## 2023-10-01 MED ORDER — MEGESTROL ACETATE 20 MG PO TABS
40.0000 mg | ORAL_TABLET | Freq: Every day | ORAL | Status: DC
  Administered 2023-10-02 – 2023-10-06 (×5): 40 mg via ORAL
  Filled 2023-10-01 (×5): qty 2

## 2023-10-01 MED ORDER — ONDANSETRON HCL 4 MG PO TABS: 4.0000 mg | ORAL_TABLET | Freq: Four times a day (QID) | ORAL | Status: DC | PRN

## 2023-10-01 NOTE — ED Triage Notes (Signed)
 Patient c/o chest pain and sob for months. Reports worsened recently.  History lung cancer. Currently getting chemo every Wednesday. Last treatment 09/27/23

## 2023-10-01 NOTE — Consult Note (Signed)
 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  No evidence of distant metastatic disease.  Pathology was consistent with non-small cell lung cancer favor adenocarcinoma.  He had chemotherapy in 2022.  Had recurrent pleural effusions with associated atelectasis.  Previous cytology from pleural biopsy showed metastatic implants with NSCLC.  He had decortication march 2025.  He returns today with respiratory distress and mild hypoxemia.     PAST MEDICAL HISTORY   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 (HCC)      SURGICAL HISTORY   Past Surgical History:  Procedure Laterality Date   CHEST TUBE INSERTION Right 04/25/2023   Procedure: RIGHT PLEURAL CHEST TUBE INSERTION;  Surgeon: Kerrin Elspeth BROCKS, MD;  Location: [facility] OR;  Service: Thoracic;  Laterality: Right;   ESOPHAGOGASTRODUODENOSCOPY (EGD) WITH PROPOFOL  N/A 12/01/2020   Procedure: ESOPHAGOGASTRODUODENOSCOPY (EGD) WITH PROPOFOL ;  Surgeon: Unk Corinn Skiff, MD;  Location: [facility] ENDOSCOPY;  Service: Gastroenterology;  Laterality: N/A;   IR THORACENTESIS ASP PLEURAL SPACE W/IMG GUIDE  12/26/2022   PERICARDIAL FLUID DRAINAGE N/A 04/25/2023   Procedure: DRAINAGE, PERICARDIAL EFFUSION;  Surgeon: Kerrin Elspeth BROCKS, MD;  Location: [facility] OR;  Service: Thoracic;  Laterality: N/A;   PERICARDIAL WINDOW N/A 04/25/2023   Procedure: CREATION, PERICARDIAL WINDOW;  Surgeon:  Kerrin Elspeth BROCKS, MD;  Location: [facility] OR;  Service: Thoracic;  Laterality: N/A;   PLEURAL EFFUSION DRAINAGE Bilateral 04/25/2023   Procedure: DRAINAGE, PLEURAL EFFUSION;  Surgeon: Kerrin Elspeth BROCKS, MD;  Location: [facility] OR;  Service: Thoracic;  Laterality: Bilateral;   PORTA CATH INSERTION N/A 10/22/2019   Procedure: PORTA CATH INSERTION;  Surgeon: Jama Cordella MATSU, MD;  Location: [facility] INVASIVE CV LAB;  Service: Cardiovascular;  Laterality: N/A;   VIDEO ASSISTED THORACOSCOPY (VATS)/DECORTICATION Left 04/25/2023   Procedure: VIDEO ASSISTED THORACOSCOPY (VATS)/DECORTICATION;  Surgeon: Kerrin Elspeth BROCKS, MD;  Location: [facility] OR;  Service: Thoracic;  Laterality: Left;   VIDEO BRONCHOSCOPY WITH ENDOBRONCHIAL ULTRASOUND N/A 09/25/2019   Procedure: VIDEO BRONCHOSCOPY WITH ENDOBRONCHIAL ULTRASOUND;  Surgeon: Tamea Dedra CROME, MD;  Location: [facility] ORS;  Service: Pulmonary;  Laterality: N/A;     FAMILY HISTORY   Family History  Problem Relation Age of Onset   Cancer Brother      SOCIAL HISTORY   Social History   Tobacco Use   Smoking status: Former    Current packs/day: 0.00    Average packs/day: 1 pack/day for 20.0 years (20.0 ttl pk-yrs)    Types: Cigarettes    Start date: 66    Quit date: 62    Years since quitting: 31.6   Smokeless tobacco: Never  Vaping Use   Vaping status: Never Used  Substance Use Topics   Alcohol use: Not Currently    Comment: not drank any beer in 2 onths   Drug use:  Never     MEDICATIONS    Home Medication:  Current Outpatient Rx   Order #: [id number] Class: Historical Med   Order #: [id number] Class: Normal   Order #: [id number] Class: Normal   Order #: [id number] Class: Normal   Order #: [id number] Class: Normal   Order #: [id number] Class: Normal   Order #: [id number] Class: Normal   Order #: [id number] Class: Normal   Order #: [id number] Class: Normal   Order #: [id number] Class: Normal   Order #: [id number] Class: Normal    Current Medication: No  current facility-administered medications for this encounter.  Current Outpatient Medications:    acetaminophen  (TYLENOL ) 500 MG tablet, Take 500 mg by mouth every 6 (six) hours as needed for mild pain (pain score 1-3) (takes as needed for generalized pain.  Denies pain at this time.)., Disp: , Rfl:    albuterol  (VENTOLIN  HFA) 108 (90 Base) MCG/ACT inhaler, INHALE 2 PUFFS INTO THE LUNGS EVERY 4 HOURS AS NEEDED FOR WHEEZING OR SHORTNESS OF BREATH, Disp: 6.7 g, Rfl: 2   ANORO ELLIPTA  62.5-25 MCG/ACT AEPB, INHALE 1 PUFF INTO THE LUNGS DAILY, Disp: 60 each, Rfl: 2   lidocaine -prilocaine  (EMLA ) cream, Apply to affected area once, Disp: 30 g, Rfl: 3   megestrol  (MEGACE ) 40 MG tablet, Take 1 tablet (40 mg total) by mouth daily., Disp: 30 tablet, Rfl: 2   ondansetron  (ZOFRAN ) 8 MG tablet, Take 1 tablet (8 mg total) by mouth every 8 (eight) hours as needed for nausea or vomiting., Disp: 30 tablet, Rfl: 1   oxyCODONE  (OXY IR/ROXICODONE ) 5 MG immediate release tablet, Take 1 tablet (5 mg total) by mouth every 6 (six) hours as needed., Disp: 120 tablet, Rfl: 0   pantoprazole  (PROTONIX ) 20 MG tablet, Take 1 tablet (20 mg total) by mouth every morning., Disp: 30 tablet, Rfl: 2   potassium chloride  SA (KLOR-CON  M) 20 MEQ tablet, TAKE 1 TABLET(20 MEQ) BY MOUTH DAILY, Disp: 7 tablet, Rfl: 0   prochlorperazine  (COMPAZINE ) 10 MG tablet, Take 1 tablet (10 mg total) by mouth every 6 (six) hours as needed for nausea or vomiting., Disp: 30 tablet, Rfl: 1   umeclidinium-vilanterol (ANORO ELLIPTA ) 62.5-25 MCG/ACT AEPB, Inhale 1 puff into the lungs daily., Disp: 1 each, Rfl: 3  Facility-Administered Medications Ordered in Other Encounters:    morphine  (PF) 2 MG/ML injection 2 mg, 2 mg, Intravenous, Once, Melanee Annah BROCKS, MD    ALLERGIES   Taxol  [paclitaxel ]     REVIEW OF SYSTEMS    Review of Systems:  Gen:  Denies  fever, sweats, chills weigh loss  HEENT: Denies blurred vision, double vision, ear pain, eye  pain, hearing loss, nose bleeds, sore throat Cardiac:  No dizziness, chest pain or heaviness, chest tightness,edema Resp:   reports dyspnea chronically  Gi: Denies swallowing difficulty, stomach pain, nausea or vomiting, diarrhea, constipation, bowel incontinence Gu:  Denies bladder incontinence, burning urine Ext:   Denies Joint pain, stiffness or swelling Skin: Denies  skin rash, easy bruising or bleeding or hives Endoc:  Denies polyuria, polydipsia , polyphagia or weight change Psych:   Denies depression, insomnia or hallucinations   Other:  All other systems negative   VS: BP 131/89 (BP Location: Left Arm)   Pulse (!) 112   Temp 97.7 F (36.5 C) (Oral)   Resp (!) 24   Ht 5' 10 (1.778 m)   Wt 64.4 kg   SpO2 96%   BMI 20.37 kg/m      PHYSICAL EXAM    GENERAL:NAD, no fevers, chills, no  weakness no fatigue HEAD: Normocephalic, atraumatic.  EYES: Pupils equal, round, reactive to light. Extraocular muscles intact. No scleral icterus.  MOUTH: Moist mucosal membrane. Dentition intact. No abscess noted.  EAR, NOSE, THROAT: Clear without exudates. No external lesions.  NECK: Supple. No thyromegaly. No nodules. No JVD.  PULMONARY: decreased breath sounds with mild rhonchi worse at bases bilaterally.  CARDIOVASCULAR: S1 and S2. Regular rate and rhythm. No murmurs, rubs, or gallops. No edema. Pedal pulses 2+ bilaterally.  GASTROINTESTINAL: Soft, nontender, nondistended. No masses. Positive bowel sounds. No hepatosplenomegaly.  MUSCULOSKELETAL: No swelling, clubbing, or edema. Range of motion full in all extremities.  NEUROLOGIC: Cranial nerves II through XII are intact. No gross focal neurological deficits. Sensation intact. Reflexes intact.  SKIN: No ulceration, lesions, rashes, or cyanosis. Skin warm and dry. Turgor intact.  PSYCHIATRIC: Mood, affect within normal limits. The patient is awake, alert and oriented x 3. Insight, judgment intact.       IMAGING   Narrative &  Impression  CLINICAL DATA:  Worsening chest pain and shortness of breath for months, history of lung cancer   EXAM: CT ANGIOGRAPHY CHEST WITH CONTRAST   TECHNIQUE: Multidetector CT imaging of the chest was performed using the standard protocol during bolus administration of intravenous contrast. Multiplanar CT image reconstructions and MIPs were obtained to evaluate the vascular anatomy.   RADIATION DOSE REDUCTION: This exam was performed according to the departmental dose-optimization program which includes automated exposure control, adjustment of the mA and/or kV according to patient size and/or use of iterative reconstruction technique.   CONTRAST:  75mL OMNIPAQUE  IOHEXOL  350 MG/ML SOLN   COMPARISON:  08/09/2023, 10/01/2023   FINDINGS: Cardiovascular: This is a technically adequate evaluation of the pulmonary vasculature. No filling defects or pulmonary emboli.   The heart is unremarkable without pericardial effusion. No evidence of thoracic aortic aneurysm or dissection. Atherosclerosis of the aorta and coronary vasculature.   Mediastinum/Nodes: No enlarged mediastinal, hilar, or axillary lymph nodes. Thyroid  gland, trachea, and esophagus demonstrate no significant findings.   Lungs/Pleura: There is a small partially loculated right pleural effusion, decreased since prior study after interval thoracentesis. Small amount of gas within the pleural space consistent with thoracentesis 09/28/2023. Persistent large multiloculated left pleural effusion, with resolution of the pleural gas seen previously.   Stable dense consolidation in the left perihilar region with air bronchograms and bronchiectasis again noted. Dependent consolidation within the right lower lobe again identified, compatible with atelectasis.   6 mm subpleural right middle lobe pulmonary nodule image 68/5 not appreciably changed since prior study. 7 x 8 mm right middle lobe pulmonary nodule image 83/5  is also unchanged. No new nodules or masses.   Stable emphysema.  Central airways are patent.   Upper Abdomen: No acute abnormality.   Musculoskeletal: No acute or destructive bony abnormalities. Reconstructed images demonstrate no additional findings.   Review of the MIP images confirms the above findings.   IMPRESSION: 1. No evidence of pulmonary embolus. 2. Stable right middle lobe pulmonary nodules, metastatic disease cannot be excluded. Continued attention on follow-up recommended. 3. Loculated right pleural effusion, decreased since prior study. Minimal right pleural gas consistent with recent thoracentesis 09/28/2023. 4. Continued large multiloculated left pleural effusion, with resolution of the left pleural gas seen previously. 5. Stable left perihilar consolidation with air bronchograms and bronchiectasis. This may reflect post therapeutic change. 6. Aortic Atherosclerosis (ICD10-I70.0) and Emphysema (ICD10-J43.9).     Electronically Signed   By: Ozell Daring M.D.   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   Patient/Family are satisfied with care plan and all questions have been answered.    Provider disclosure: Patient with at least one acute or chronic illness or injury that poses a threat to life or bodily function and is being managed actively during this encounter.  All of the below services have been performed independently by signing provider:  review of prior documentation from internal and or external health records.  Review of previous and current lab results.  Interview and comprehensive assessment during patient visit  today. Review of current and previous chest radiographs/CT scans. Discussion of management and test interpretation with health care team and patient/family.   This document was prepared using Dragon voice recognition software and may include unintentional dictation errors.     Trayson Stitely, M.D.  Division of Pulmonary & Critical Care Medicine

## 2023-10-01 NOTE — H&P (Signed)
 [facility] History and Physical   Patient: Mitchell Frye FMW:[id number]   PCP: [facility] DOB: 05/06/1950   DOA: 10/01/2023   DOS: 10/01/2023   DOS: the patient was seen and examined on 10/01/2023  Patient coming from: The patient is coming from Home  Chief Complaint: Shortness of breath and chest pain  HPI: Amro Winebarger is a 73 y.o. male with Past medical history of adenocarcinoma of lung and COPD, is reviewed from EMR, presented to [facility] ED with complaining of recurrent chest pain and shortness of breath.  Patient has recurrent pleural effusion, multiple thoracentesis were done, recent thoracentesis was done on 8/14, 500 mL fluid was tapped from right side.  Patient returned back to ED with same complaint of shortness of breath and chest pain.  In the ED patient was noticed to have dyspnea on exertion and hypoxia so admission was requested.  Case was also discussed with pulmonary.   ED Course: Afebrile, HR 112, RR 24, BP 131/89, 96% at rest on room air, O2 sat drop on exertion to 88% BMP glucose 110, rest within normal range CBC Hb 10.7, anemia and thrombocytosis platelet count 441  CTA chest: 1. No evidence of pulmonary embolus. 2. Stable right middle lobe pulmonary nodules, metastatic disease cannot be excluded. Continued attention on follow-up recommended. 3. Loculated right pleural effusion, decreased since prior study. Minimal right pleural gas consistent with recent thoracentesis 09/28/2023. 4. Continued large multiloculated left pleural effusion, with resolution of the left pleural gas seen previously. 5. Stable left perihilar consolidation with air bronchograms and bronchiectasis. This may reflect post therapeutic change.    Review of Systems: as mentioned in the history of present illness.  All other systems reviewed and are negative.  Past Medical History:  Diagnosis Date   Dyspnea    Hyperlipidemia    Hypertension    Primary malignant  neoplasm of left lower lobe of lung [facility])    Past Surgical History:  Procedure Laterality Date   CHEST TUBE INSERTION Right 04/25/2023   Procedure: RIGHT PLEURAL CHEST TUBE INSERTION;  Surgeon: Kerrin Elspeth BROCKS, MD;  Location: [facility] OR;  Service: Thoracic;  Laterality: Right;   ESOPHAGOGASTRODUODENOSCOPY (EGD) WITH PROPOFOL  N/A 12/01/2020   Procedure: ESOPHAGOGASTRODUODENOSCOPY (EGD) WITH PROPOFOL ;  Surgeon: Unk Corinn Skiff, MD;  Location: [facility] ENDOSCOPY;  Service: Gastroenterology;  Laterality: N/A;   IR THORACENTESIS ASP PLEURAL SPACE W/IMG GUIDE  12/26/2022   PERICARDIAL FLUID DRAINAGE N/A 04/25/2023   Procedure: DRAINAGE, PERICARDIAL EFFUSION;  Surgeon: Kerrin Elspeth BROCKS, MD;  Location: [facility] OR;  Service: Thoracic;  Laterality: N/A;   PERICARDIAL WINDOW N/A 04/25/2023   Procedure: CREATION, PERICARDIAL WINDOW;  Surgeon: Kerrin Elspeth BROCKS, MD;  Location: [facility] OR;  Service: Thoracic;  Laterality: N/A;   PLEURAL EFFUSION DRAINAGE Bilateral 04/25/2023   Procedure: DRAINAGE, PLEURAL EFFUSION;  Surgeon: Kerrin Elspeth BROCKS, MD;  Location: [facility] OR;  Service: Thoracic;  Laterality: Bilateral;   PORTA CATH INSERTION N/A 10/22/2019   Procedure: PORTA CATH INSERTION;  Surgeon: Jama Cordella MATSU, MD;  Location: [facility] INVASIVE CV LAB;  Service: Cardiovascular;  Laterality: N/A;   VIDEO ASSISTED THORACOSCOPY (VATS)/DECORTICATION Left 04/25/2023   Procedure: VIDEO ASSISTED THORACOSCOPY (VATS)/DECORTICATION;  Surgeon: Kerrin Elspeth BROCKS, MD;  Location: [facility] OR;  Service: Thoracic;  Laterality: Left;   VIDEO BRONCHOSCOPY WITH ENDOBRONCHIAL ULTRASOUND N/A 09/25/2019   Procedure: VIDEO BRONCHOSCOPY WITH ENDOBRONCHIAL ULTRASOUND;  Surgeon: Tamea Dedra CROME, MD;  Location: [facility] ORS;  Service: Pulmonary;  Laterality: N/A;   Social History:  reports  that he quit smoking about 31 years ago. His smoking use included cigarettes. He started smoking about 51 years ago. He has a 20 pack-year smoking history. He  has never used smokeless tobacco. He reports that he does not currently use alcohol. He reports that he does not use drugs.  Allergies  Allergen Reactions   Taxol  [Paclitaxel ] Other (See Comments)    Chest pain, hip pain, coughing , wheezing     Family history reviewed and not pertinent Family History  Problem Relation Age of Onset   Cancer Brother      Prior to Admission medications   Medication Sig Start Date End Date Taking? Authorizing Provider  acetaminophen  (TYLENOL ) 500 MG tablet Take 500 mg by mouth every 6 (six) hours as needed for mild pain (pain score 1-3) (takes as needed for generalized pain.  Denies pain at this time.).    [provider]  albuterol  (VENTOLIN  HFA) 108 (90 Base) MCG/ACT inhaler INHALE 2 PUFFS INTO THE LUNGS EVERY 4 HOURS AS NEEDED FOR WHEEZING OR SHORTNESS OF BREATH 06/22/23   Melanee Annah BROCKS, MD  ANORO ELLIPTA  62.5-25 MCG/ACT AEPB INHALE 1 PUFF INTO THE LUNGS DAILY 07/06/21   Melanee Annah BROCKS, MD  lidocaine -prilocaine  (EMLA ) cream Apply to affected area once 05/17/23   Melanee Annah BROCKS, MD  megestrol  (MEGACE ) 40 MG tablet Take 1 tablet (40 mg total) by mouth daily. 08/16/23   Melanee Annah BROCKS, MD  ondansetron  (ZOFRAN ) 8 MG tablet Take 1 tablet (8 mg total) by mouth every 8 (eight) hours as needed for nausea or vomiting. 05/17/23   Melanee Annah BROCKS, MD  oxyCODONE  (OXY IR/ROXICODONE ) 5 MG immediate release tablet Take 1 tablet (5 mg total) by mouth every 6 (six) hours as needed. 09/27/23   Melanee Annah BROCKS, MD  pantoprazole  (PROTONIX ) 20 MG tablet Take 1 tablet (20 mg total) by mouth every morning. 07/05/23   Rao, Archana C, MD  potassium chloride  SA (KLOR-CON  M) 20 MEQ tablet TAKE 1 TABLET(20 MEQ) BY MOUTH DAILY 07/11/23   Rao, Archana C, MD  prochlorperazine  (COMPAZINE ) 10 MG tablet Take 1 tablet (10 mg total) by mouth every 6 (six) hours as needed for nausea or vomiting. 05/17/23   Rao, Archana C, MD  umeclidinium-vilanterol (ANORO ELLIPTA ) 62.5-25 MCG/ACT AEPB Inhale 1  puff into the lungs daily. 05/16/23   Malka Domino, MD    Physical Exam: Vitals:   10/01/23 0850 10/01/23 0851 10/01/23 1130 10/01/23 1133  BP: 131/89  (!) 158/95   Pulse: (!) 112  (!) 107 (!) 108  Resp: (!) 24  (!) 35 (!) 25  Temp: 97.7 F (36.5 C)     TempSrc: Oral     SpO2: 96%  (!) 88% 100%  Weight:  64.4 kg    Height:  5' 10 (1.778 m)      General: alert and oriented to time, place, and person. Appear in mild distress, affect anxious Eyes: PERRLA, Conjunctiva normal ENT: Oral Mucosa Clear, moist  Neck: no JVD, no Abnormal Mass Or lumps Cardiovascular: S1 and S2 Present, no Murmur, peripheral pulses symmetrical Respiratory: increased respiratory effort, Bilateral Air entry equal and Decreased, no signs of accessory muscle use,  Mild crackles bilaterally, no wheezes Abdomen: Bowel Sound present, Soft and no tenderness. Skin: no rashes  Extremities: no Pedal edema, no calf tenderness Neurologic: without any new focal findings Gait not checked due to patient safety concerns  Data Reviewed: I have personally reviewed and interpreted labs, imaging as discussed below.  CBC: Recent Labs  Lab 09/27/23 1023 09/27/23 1203 10/01/23 0921  WBC 3.5* 3.6* 9.4  NEUTROABS 2.3  --   --   HGB 9.0* 9.2* 10.7*  HCT 29.2* 29.5* 34.9*  MCV 83.7 85.0 84.7  PLT 379 338 441*   Basic Metabolic Panel: Recent Labs  Lab 09/27/23 1023 09/27/23 1203 10/01/23 0921  NA 137 138 140  K 3.2* 3.4* 3.5  CL 105 106 102  CO2 24 25 24   GLUCOSE 148* 100* 110*  BUN 12 13 12   CREATININE 0.86 0.86 0.87  CALCIUM 8.7* 8.7* 9.1   GFR: Estimated Creatinine Clearance: 67.9 mL/min (by C-G formula based on SCr of 0.87 mg/dL). Liver Function Tests: Recent Labs  Lab 09/27/23 1023  AST 22  ALT 15  ALKPHOS 60  BILITOT 0.6  PROT 7.2  ALBUMIN  2.4*   No results for input(s): LIPASE, AMYLASE in the last 168 hours. No results for input(s): AMMONIA in the last 168 hours. Coagulation  Profile: No results for input(s): INR, PROTIME in the last 168 hours. Cardiac Enzymes: No results for input(s): CKTOTAL, CKMB, CKMBINDEX, TROPONINI in the last 168 hours. BNP (last 3 results) No results for input(s): PROBNP in the last 8760 hours. HbA1C: No results for input(s): HGBA1C in the last 72 hours. CBG: No results for input(s): GLUCAP in the last 168 hours. Lipid Profile: No results for input(s): CHOL, HDL, LDLCALC, TRIG, CHOLHDL, LDLDIRECT in the last 72 hours. Thyroid  Function Tests: No results for input(s): TSH, T4TOTAL, FREET4, T3FREE, THYROIDAB in the last 72 hours. Anemia Panel: No results for input(s): VITAMINB12, FOLATE, FERRITIN, TIBC, IRON , RETICCTPCT in the last 72 hours. Urine analysis: No results found for: COLORURINE, APPEARANCEUR, LABSPEC, PHURINE, GLUCOSEU, HGBUR, BILIRUBINUR, KETONESUR, PROTEINUR, UROBILINOGEN, NITRITE, LEUKOCYTESUR  Radiological Exams on Admission: DG Chest 2 View Result Date: 10/01/2023 CLINICAL DATA:  Chest pain. EXAM: DG CHEST 2V COMPARISON:  09/28/2023 FINDINGS: Bilateral pleural effusions again noted with probable loculated component on the left. Left hilar opacity is similar to prior. Right Port-A-Cath remains in place. Cardiopericardial silhouette is at upper limits of normal for size. No acute bony abnormality. Telemetry leads overlie the chest. IMPRESSION: 1. No substantial change. Bilateral pleural effusions with probable loculated component on the left. 2. Left hilar opacity is similar to prior. Electronically Signed   By: Camellia Candle M.D.   On: 10/01/2023 09:26   I reviewed all nursing notes, pharmacy notes, vitals, pertinent old records.  Assessment/Plan Principal Problem:   Recurrent pleural effusion   # Recurrent pleural effusion due to underlying lung cancer S/p multiple thoracentesis, recent thoracentesis was done on 8/14 CT chest as above, shows  bilateral pleural effusion Follow IR for thoracentesis tomorrow a.m. if sufficient to drain fluid Follow pulmonary consult  # Dyspnea on exertion and hypoxia Continue supplemental O2 halogen and symptomatic treatment Check O2 sats on ambulation RN was advised to qualify patient for home oxygen and arrange for home O2 TOC consulted   # COPD No exacerbation noticed on my exam Continue Anoro Ellipta  inhaler DuoNeb as needed   # Lung cancer, adenocarcinoma s/p chemotherapy Continue to follow with oncologist as an outpatient    Nutrition: Regular diet DVT Prophylaxis: Subcutaneous Lovenox   Advance goals of care discussion: Full code   Consults: Pulmonologist  Family Communication: family was not present at bedside, at the time of interview.  Opportunity was given to ask question and all questions were answered satisfactorily.  Disposition: Admitted as observation, telemetry unit. Likely to be discharged home,  in 1-2 days when stable.  I have discussed plan of care as described above with RN and patient/family.  Severity of Illness: The appropriate patient status for this patient is OBSERVATION. Observation status is judged to be reasonable and necessary in order to provide the required intensity of service to ensure the patient's safety. The patient's presenting symptoms, physical exam findings, and initial radiographic and laboratory data in the context of their medical condition is felt to place them at decreased risk for further clinical deterioration. Furthermore, it is anticipated that the patient will be medically stable for discharge from the hospital within 2 midnights of admission.    Author: ELVAN SOR, MD [facility] 10/01/2023 11:42 AM   To reach On-call, see care teams to locate the attending and reach out to them via [email or web address]. If 7PM-7AM, please contact night-coverage If you still have difficulty reaching the attending provider, please page the [facility]  (Director on Call) for [facility] on amion for assistance.

## 2023-10-01 NOTE — ED Provider Notes (Signed)
 [facility] Provider Note    Event Date/Time   First MD Initiated Contact with Patient 10/01/23 [id number]     (approximate)   History   Chest Pain and Shortness of Breath   HPI  Mitchell Frye is a 73 y.o. male  [name] adenocarcinoma of the lung in September 2021.PET CT scan showed hypermetabolic mass in the left lower lobe with mediastinal adenopathy and right paratracheal adenopathy.  No evidence of distant metastatic disease.  Pathology was consistent with non-small cell lung cancer favor adenocarcinoma.    Had fluid drawn off his right lung a few days ago reports the symptoms have come back and are becoming more severe.  He is very short of breath whenever he tries to do anything he will feel like he cannot walk at all he gets so short of breath he immediately has to sit down.  This is the same issue that he had in the past, he just had to have fluid drawn off the right lung feels like he might need more fluid drawn out   Underwent right-sided thoracentesis with 500 mL of bloody fluid from the right lung on August 14 this year     Physical Exam   Triage Vital Signs: ED Triage Vitals  Encounter Vitals Group     BP 10/01/23 0850 131/89     Girls Systolic BP Percentile --      Girls Diastolic BP Percentile --      Boys Systolic BP Percentile --      Boys Diastolic BP Percentile --      Pulse Rate 10/01/23 0850 (!) 112     Resp 10/01/23 0850 (!) 24     Temp 10/01/23 0850 97.7 F (36.5 C)     Temp Source 10/01/23 0850 Oral     SpO2 10/01/23 0850 96 %     Weight 10/01/23 0851 142 lb (64.4 kg)     Height 10/01/23 0851 5' 10 (1.778 m)     Head Circumference --      Peak Flow --      Pain Score 10/01/23 0851 10     Pain Loc --      Pain Education --      Exclude from Growth Chart --     Most recent vital signs: Vitals:   10/01/23 1133 10/01/23 1200  BP:  (!) 158/91  Pulse: (!) 108 (!) 104  Resp: (!) 25 (!) 42  Temp:    SpO2: 100% 100%      General: Awake, no distress except tachypneic.  Patient reports dyspnea.  Accessory muscle use is present but no severe distress or impending respiratory failure. CV:  Good peripheral perfusion.  Mild tachycardia normal tone Resp:  Diminished lung sounds in the bases bilateral.  Tachypnea and accessory muscle use.  No wheezing.  No stridor.  Appears dyspneic.  Placed on 2 L oxygen for comfort. Abd:  No distention.  Soft nontender Other:  No frank edema   ED Results / Procedures / Treatments   Labs (all labs ordered are listed, but only abnormal results are displayed) Labs Reviewed  BASIC METABOLIC PANEL WITH GFR - Abnormal; Notable for the following components:      Result Value   Glucose, Bld 110 (*)    All other components within normal limits  CBC - Abnormal; Notable for the following components:   RBC 4.12 (*)    Hemoglobin 10.7 (*)    HCT 34.9 (*)  RDW 17.9 (*)    Platelets 441 (*)    All other components within normal limits  MAGNESIUM   PHOSPHORUS  TROPONIN I (HIGH SENSITIVITY)  TROPONIN I (HIGH SENSITIVITY)     EKG  Interpreted by me at 9 AM heart rate 110 QRS 80 QTc 460 Sinus tachycardia, mild nonspecific T wave abnormality.  No evidence of frank acute ischemia   RADIOLOGY  Detailed review of chest x-ray in this case seems complicated, defer to radiologist for detailed read but to my evaluation there is obvious bilateral pleural effusions  DG Chest 2 View Result Date: 10/01/2023 CLINICAL DATA:  Chest pain. EXAM: DG CHEST 2V COMPARISON:  09/28/2023 FINDINGS: Bilateral pleural effusions again noted with probable loculated component on the left. Left hilar opacity is similar to prior. Right Port-A-Cath remains in place. Cardiopericardial silhouette is at upper limits of normal for size. No acute bony abnormality. Telemetry leads overlie the chest. IMPRESSION: 1. No substantial change. Bilateral pleural effusions with probable loculated component on the left. 2.  Left hilar opacity is similar to prior. Electronically Signed   By: Camellia Candle M.D.   On: 10/01/2023 09:26       PROCEDURES:  Critical Care performed: No  Procedures   MEDICATIONS ORDERED IN ED: Medications  enoxaparin  (LOVENOX ) injection 40 mg (has no administration in time range)  sodium chloride  flush (NS) 0.9 % injection 3 mL (has no administration in time range)  sodium chloride  flush (NS) 0.9 % injection 3 mL (has no administration in time range)  0.9 %  sodium chloride  infusion (has no administration in time range)  acetaminophen  (TYLENOL ) tablet 650 mg (has no administration in time range)    Or  acetaminophen  (TYLENOL ) suppository 650 mg (has no administration in time range)  morphine  (PF) 2 MG/ML injection 2 mg (has no administration in time range)  polyethylene glycol (MIRALAX  / GLYCOLAX ) packet 17 g (has no administration in time range)  bisacodyl  (DULCOLAX) suppository 10 mg (has no administration in time range)  ondansetron  (ZOFRAN ) tablet 4 mg (has no administration in time range)    Or  ondansetron  (ZOFRAN ) injection 4 mg (has no administration in time range)  megestrol  (MEGACE ) tablet 40 mg (has no administration in time range)  umeclidinium-vilanterol (ANORO ELLIPTA ) 62.5-25 MCG/ACT 1 puff (has no administration in time range)  oxyCODONE  (Oxy IR/ROXICODONE ) immediate release tablet 5 mg (has no administration in time range)  ipratropium-albuterol  (DUONEB) 0.5-2.5 (3) MG/3ML nebulizer solution 3 mL (has no administration in time range)  morphine  (PF) 4 MG/ML injection 4 mg (4 mg Intravenous Given 10/01/23 1112)     IMPRESSION / MDM / ASSESSMENT AND PLAN / ED COURSE  I reviewed the triage vital signs and the nursing notes.                              Differential diagnosis includes, but is not limited to, pleural effusion, recurrent effusion, cancer burden etc.  Based on his previous history to  imaging his recent thoracentesis and recurrence of what he  describes as almost identical symptoms I suspect recurrent pleural effusion is likely driving his presentation    With standing to urinate and moving out of the bed oxygen saturation decreases to 85% and quickly rebounds to 99% on 2 L.  Patient's presentation is most consistent with acute complicated illness / injury requiring diagnostic workup.      Clinical Course as of 10/01/23 1212  [facility] Oct 01, 2023  1045 Complex history of pulmonary disease/cancer, effusions, prior VATS.  I have paged our pulmonologist on-call to obtain further recommendations [MQ]    Clinical Course User Index [MQ] Dicky Anes, MD   ----------------------------------------- 11:06 AM on 10/01/2023 -----------------------------------------  Called patient's emergency contact [place],Elaine and both she and I spoke to the patient via speaker phone encouraged him to stay for treatment.  His sister advising that he has been moaning and groaning and has been in discomfort complaining of shortness of breath for the last 2 days as well.  Patient is now agreeable to stay.  Understands we will be treating his pain and symptoms with morphine , we have a consultation with our lung specialist pending for today, and further treatment regarding the fluid on his lungs.  He is now agreeable to admission, initially wanted to discharge and come back tomorrow but after talking with myself and his sister he is able to make a reasonable decision to stay.  Otherwise I told him he would be leaving AGAINST MEDICAL ADVICE which she is well aware of, understands the risks including that he could stop breathing have low oxygen or dry if this progresses.  Patient understands this risk initially stating he wanted to go home, but after talking with his sister it seems that his cat and dog would be cared for.  ----------------------------------------- 12:10 PM on 10/01/2023 ----------------------------------------- Has been seen by  pulmonologist awaiting recommendations for further treatment.  Patient more calm respirating more comfortably after receiving morphine  and being monitored carefully currently on 2 L.  Admitted to the care of Dr. Von due to the dyspnea, exertional hypoxia component and degree of pleural effusion with significant complexity  FINAL CLINICAL IMPRESSION(S) / ED DIAGNOSES   Final diagnoses:  Bilateral pleural effusion  Exertional dyspnea     Rx / DC Orders   ED Discharge Orders     None        Note:  This document was prepared using Dragon voice recognition software and may include unintentional dictation errors.   Dicky Anes, MD 10/01/23 1212

## 2023-10-01 NOTE — Progress Notes (Signed)
 Hematology/Oncology Consult note [facility]  Telephone:([phone] [phone]  Patient Care Team: Center, 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  No evidence of distant metastatic disease.  Pathology was consistent with non-small cell lung cancer favor adenocarcinoma.  Cells were positive for TTF-1 and negative for p40.   He received 1 cycle of CarboTaxol and then had significant reaction to Taxol .  He was switched to Abraxane  for 3 more cycles but due to shortage of Abraxane  he was switched to carbo Alimta  for 2 cycles ending in October 2021 concurrent with radiation.  Subsequently he was started on maintenance durvalumab  which she continued until September 2022.     CT scans in October 2022 showed concern for hilar recurrence and patient was retreated with carbo Alimta  followed by maintenance Alimta  which she received until December 2023.  He has been off treatment since then due to stable disease but Alimta  was also complicated by worsening anemia and AKI.   Skains while he was on Alimta  showed mild to moderate left pleural effusion.  He has had multiple thoracentesis in the past which were all negative for malignancy. PET scan in October 2024 did not show any evidence of recurrent or progressive disease  elsewhere but showed large left pleural effusion with complete left lower lobe collapse.  Also noted to have small to moderate pericardial effusion on his CT scans.    He was [facility] and underwentVATS procedure with decortication and drainage of pleural and pericardial effusion.  Cytology from thoracentesis as well as pericardial fluid was negative for malignancy.  Pericardial biopsy showed nonspecific chronic inflammation.  However his visceral pleural biopsy showed scant foci of non-small cell carcinoma only within lymphovascular space.   NGS testing back in 2021 showed no evidence of actionable mutations.  Tumor mutational burden 0.  MSI stable PD-L1 10%    Interval history-patient is complaining of increasing chest wall pain over the last few days.  He has not been using any pain medication.  ECOG PS- 2 Pain scale- 6 Opioid associated constipation- no  Review of systems- Review of Systems  Constitutional:  Positive for malaise/fatigue.  Respiratory:  Positive for shortness of breath.   Cardiovascular:  Positive for chest pain.      Allergies  Allergen Reactions   Taxol  [Paclitaxel ] Other (See Comments)    Chest pain, hip pain, coughing , wheezing     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 [facility])      Past Surgical History:  Procedure Laterality Date   CHEST TUBE INSERTION Right 04/25/2023   Procedure: RIGHT PLEURAL CHEST TUBE INSERTION;  Surgeon: Kerrin Elspeth BROCKS, MD;  Location: [facility] OR;  Service: Thoracic;  Laterality: Right;  ESOPHAGOGASTRODUODENOSCOPY (EGD) WITH PROPOFOL  N/A 12/01/2020   Procedure: ESOPHAGOGASTRODUODENOSCOPY (EGD) WITH PROPOFOL ;  Surgeon: Unk Corinn Skiff, MD;  Location: [facility] ENDOSCOPY;  Service: Gastroenterology;  Laterality: N/A;   IR THORACENTESIS ASP PLEURAL SPACE W/IMG GUIDE  12/26/2022   PERICARDIAL FLUID DRAINAGE N/A  04/25/2023   Procedure: DRAINAGE, PERICARDIAL EFFUSION;  Surgeon: Kerrin Elspeth BROCKS, MD;  Location: [facility] OR;  Service: Thoracic;  Laterality: N/A;   PERICARDIAL WINDOW N/A 04/25/2023   Procedure: CREATION, PERICARDIAL WINDOW;  Surgeon: Kerrin Elspeth BROCKS, MD;  Location: [facility] OR;  Service: Thoracic;  Laterality: N/A;   PLEURAL EFFUSION DRAINAGE Bilateral 04/25/2023   Procedure: DRAINAGE, PLEURAL EFFUSION;  Surgeon: Kerrin Elspeth BROCKS, MD;  Location: [facility] OR;  Service: Thoracic;  Laterality: Bilateral;   PORTA CATH INSERTION N/A 10/22/2019   Procedure: PORTA CATH INSERTION;  Surgeon: Jama Cordella MATSU, MD;  Location: [facility] INVASIVE CV LAB;  Service: Cardiovascular;  Laterality: N/A;   VIDEO ASSISTED THORACOSCOPY (VATS)/DECORTICATION Left 04/25/2023   Procedure: VIDEO ASSISTED THORACOSCOPY (VATS)/DECORTICATION;  Surgeon: Kerrin Elspeth BROCKS, MD;  Location: [facility] OR;  Service: Thoracic;  Laterality: Left;   VIDEO BRONCHOSCOPY WITH ENDOBRONCHIAL ULTRASOUND N/A 09/25/2019   Procedure: VIDEO BRONCHOSCOPY WITH ENDOBRONCHIAL ULTRASOUND;  Surgeon: Tamea Dedra CROME, MD;  Location: [facility] ORS;  Service: Pulmonary;  Laterality: N/A;    Social History   Socioeconomic History   Marital status: Single    Spouse name: Not on file   Number of children: Not on file   Years of education: Not on file   Highest education level: Not on file  Occupational History   Not on file  Tobacco Use   Smoking status: Former    Current packs/day: 0.00    Average packs/day: 1 pack/day for 20.0 years (20.0 ttl pk-yrs)    Types: Cigarettes    Start date: 17    Quit date: 11    Years since quitting: 31.6   Smokeless tobacco: Never  Vaping Use   Vaping status: Never Used  Substance and Sexual Activity   Alcohol use: Not Currently    Comment: not drank any beer in 2 onths   Drug use: Never   Sexual activity: Not Currently  Other Topics Concern   Not on file  Social History Narrative   Not on file   Social  Drivers of Health   Financial Resource Strain: Not on file  Food Insecurity: No Food Insecurity (10/01/2023)   Hunger Vital Sign    Worried About Running Out of Food in the Last Year: Never true    Ran Out of Food in the Last Year: Never true  Transportation Needs: No Transportation Needs (10/01/2023)   PRAPARE - Administrator, Civil Service (Medical): No    Lack of Transportation (Non-Medical): No  Physical Activity: Not on file  Stress: Not on file  Social Connections: Socially Isolated (10/01/2023)   Social Connection and Isolation Panel    Frequency of Communication with Friends and Family: More than three times a week    Frequency of Social Gatherings with Friends and Family: More than three times a week    Attends Religious Services: Never    Database administrator or Organizations: No    Attends Banker Meetings: Never    Marital Status: Never married  Intimate Partner Violence: Not At Risk (10/01/2023)   Humiliation, Afraid, Rape, and Kick questionnaire    Fear of Current or Ex-Partner: No    Emotionally Abused: No    Physically  Abused: No    Sexually Abused: No    Family History  Problem Relation Age of Onset   Cancer Brother      Current Facility-Administered Medications:    morphine  (PF) 2 MG/ML injection 2 mg, 2 mg, Intravenous, Once, Melanee Annah BROCKS, MD No current outpatient medications on file.  Facility-Administered Medications Ordered in Other Visits:    0.9 %  sodium chloride  infusion, 250 mL, Intravenous, PRN, Von Bellis, MD   acetaminophen  (TYLENOL ) tablet 650 mg, 650 mg, Oral, Q6H PRN **OR** acetaminophen  (TYLENOL ) suppository 650 mg, 650 mg, Rectal, Q6H PRN, Von Bellis, MD   bisacodyl  (DULCOLAX) suppository 10 mg, 10 mg, Rectal, Daily PRN, Von Bellis, MD   enoxaparin  (LOVENOX ) injection 40 mg, 40 mg, Subcutaneous, Q24H, Von Bellis, MD, 40 mg at 10/01/23 1320   feeding supplement (ENSURE PLUS HIGH PROTEIN) liquid 237 mL,  237 mL, Oral, BID BM, Von Bellis, MD   ipratropium-albuterol  (DUONEB) 0.5-2.5 (3) MG/3ML nebulizer solution 3 mL, 3 mL, Nebulization, Q6H PRN, Von Bellis, MD   megestrol  (MEGACE ) tablet 40 mg, 40 mg, Oral, Daily, Von Bellis, MD   morphine  (PF) 2 MG/ML injection 2 mg, 2 mg, Intravenous, Q2H PRN, Von Bellis, MD, 2 mg at 10/01/23 1319   ondansetron  (ZOFRAN ) tablet 4 mg, 4 mg, Oral, Q6H PRN **OR** ondansetron  (ZOFRAN ) injection 4 mg, 4 mg, Intravenous, Q6H PRN, Von Bellis, MD   oxyCODONE  (Oxy IR/ROXICODONE ) immediate release tablet 5 mg, 5 mg, Oral, Q6H PRN, Von Bellis, MD   polyethylene glycol (MIRALAX  / GLYCOLAX ) packet 17 g, 17 g, Oral, Daily PRN, Von Bellis, MD   sodium chloride  flush (NS) 0.9 % injection 3 mL, 3 mL, Intravenous, Q12H, Von Bellis, MD, 3 mL at 10/01/23 1319   sodium chloride  flush (NS) 0.9 % injection 3 mL, 3 mL, Intravenous, PRN, Von Bellis, MD   umeclidinium-vilanterol (ANORO ELLIPTA ) 62.5-25 MCG/ACT 1 puff, 1 puff, Inhalation, Daily, Von Bellis, MD  Physical exam:  Vitals:   09/27/23 1106  BP: 119/75  Pulse: (!) 108  Resp: 19  Temp: (!) 96.6 F (35.9 C)  TempSrc: Tympanic  SpO2: 99%  Weight: 142 lb (64.4 kg)  Height: 5' 10 (1.778 m)   Physical Exam Constitutional:      Comments: Appears anxious and distressed from chest pain  Cardiovascular:     Rate and Rhythm: Regular rhythm. Tachycardia present.     Heart sounds: Normal heart sounds.  Pulmonary:     Comments: Breath sounds decreased b/l over bases Abdominal:     General: Bowel sounds are normal.     Palpations: Abdomen is soft.  Skin:    General: Skin is warm and dry.  Neurological:     Mental Status: He is alert and oriented to person, place, and time.      I have personally reviewed labs listed below:    Latest Ref Rng & Units 10/01/2023    9:21 AM  CMP  Glucose 70 - 99 mg/dL 889   BUN 8 - 23 mg/dL 12   Creatinine 9.38 - 1.24 mg/dL 9.12   Sodium 864 - 854 mmol/L  140   Potassium 3.5 - 5.1 mmol/L 3.5   Chloride 98 - 111 mmol/L 102   CO2 22 - 32 mmol/L 24   Calcium 8.9 - 10.3 mg/dL 9.1       Latest Ref Rng & Units 10/01/2023    9:21 AM  CBC  WBC 4.0 - 10.5 K/uL 9.4   Hemoglobin 13.0 - 17.0  g/dL 89.2   Hematocrit 60.9 - 52.0 % 34.9   Platelets 150 - 400 K/uL 441    I have personally reviewed Radiology images listed below: No images are attached to the encounter.  CT Angio Chest Pulmonary Embolism (PE) W or WO Contrast Result Date: 10/01/2023 CLINICAL DATA:  Worsening chest pain and shortness of breath for months, history of lung cancer EXAM: CT ANGIOGRAPHY CHEST WITH CONTRAST TECHNIQUE: Multidetector CT imaging of the chest was performed using the standard protocol during bolus administration of intravenous contrast. Multiplanar CT image reconstructions and MIPs were obtained to evaluate the vascular anatomy. RADIATION DOSE REDUCTION: This exam was performed according to the departmental dose-optimization program which includes automated exposure control, adjustment of the mA and/or kV according to patient size and/or use of iterative reconstruction technique. CONTRAST:  75mL OMNIPAQUE  IOHEXOL  350 MG/ML SOLN COMPARISON:  08/09/2023, 10/01/2023 FINDINGS: Cardiovascular: This is a technically adequate evaluation of the pulmonary vasculature. No filling defects or pulmonary emboli. The heart is unremarkable without pericardial effusion. No evidence of thoracic aortic aneurysm or dissection. Atherosclerosis of the aorta and coronary vasculature. Mediastinum/Nodes: No enlarged mediastinal, hilar, or axillary lymph nodes. Thyroid  gland, trachea, and esophagus demonstrate no significant findings. Lungs/Pleura: There is a small partially loculated right pleural effusion, decreased since prior study after interval thoracentesis. Small amount of gas within the pleural space consistent with thoracentesis 09/28/2023. Persistent large multiloculated left pleural effusion,  with resolution of the pleural gas seen previously. Stable dense consolidation in the left perihilar region with air bronchograms and bronchiectasis again noted. Dependent consolidation within the right lower lobe again identified, compatible with atelectasis. 6 mm subpleural right middle lobe pulmonary nodule image 68/5 not appreciably changed since prior study. 7 x 8 mm right middle lobe pulmonary nodule image 83/5 is also unchanged. No new nodules or masses. Stable emphysema.  Central airways are patent. Upper Abdomen: No acute abnormality. Musculoskeletal: No acute or destructive bony abnormalities. Reconstructed images demonstrate no additional findings. Review of the MIP images confirms the above findings. IMPRESSION: 1. No evidence of pulmonary embolus. 2. Stable right middle lobe pulmonary nodules, metastatic disease cannot be excluded. Continued attention on follow-up recommended. 3. Loculated right pleural effusion, decreased since prior study. Minimal right pleural gas consistent with recent thoracentesis 09/28/2023. 4. Continued large multiloculated left pleural effusion, with resolution of the left pleural gas seen previously. 5. Stable left perihilar consolidation with air bronchograms and bronchiectasis. This may reflect post therapeutic change. 6. Aortic Atherosclerosis (ICD10-I70.0) and Emphysema (ICD10-J43.9). Electronically Signed   By: Ozell Daring M.D.   On: 10/01/2023 12:58   DG Chest 2 View Result Date: 10/01/2023 CLINICAL DATA:  Chest pain. EXAM: DG CHEST 2V COMPARISON:  09/28/2023 FINDINGS: Bilateral pleural effusions again noted with probable loculated component on the left. Left hilar opacity is similar to prior. Right Port-A-Cath remains in place. Cardiopericardial silhouette is at upper limits of normal for size. No acute bony abnormality. Telemetry leads overlie the chest. IMPRESSION: 1. No substantial change. Bilateral pleural effusions with probable loculated component on the  left. 2. Left hilar opacity is similar to prior. Electronically Signed   By: Camellia Candle M.D.   On: 10/01/2023 09:26   DG Chest Port 1 View Result Date: 09/28/2023 CLINICAL DATA:  s/p right thoracentesis EXAM: PORTABLE CHEST 1 VIEW COMPARISON:  IR thoracentesis, earlier same day. Chest XR, 09/27/2023. CT chest, 09/08/2023. FINDINGS: Support lines; RIGHT chest port, catheter tip well-positioned at the superior cavoatrial junction Obscured LEFT cardiac border. Patchy RIGHT  lung nodular opacities, the RIGHT chest is otherwise relatively clear. Improved aeration of the RIGHT chest post thoracentesis without residual pleural effusion. No pneumothorax. Similar degree of LEFT hilar prominence, basilar mass and pleural thickening. No interval osseous abnormality. IMPRESSION: 1. Improved aeration of the RIGHT chest post thoracentesis without residual pleural effusion. No pneumothorax 2. Similar prominence of the LEFT hilum, basilar mass and pleural thickening. Electronically Signed   By: Thom Hall M.D.   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 Request for therapeutic thoracentesis. EXAM: ULTRASOUND GUIDED RIGHT THORACENTESIS MEDICATIONS: 1% lidocaine , 8 mL. COMPLICATIONS: None immediate. PROCEDURE: An ultrasound guided thoracentesis was thoroughly discussed with the patient and questions answered. The benefits, risks, alternatives and complications were also discussed. The patient understands and wishes to proceed with the procedure. Written consent was obtained. Ultrasound was performed to localize and mark an adequate pocket of fluid in the RIGHT chest. The area was then prepped and draped in the normal sterile fashion. 1% Lidocaine  was used for local anesthesia. Under ultrasound guidance a 6 Fr Safe-T-Centesis catheter was  introduced. Thoracentesis was performed. The catheter was removed and a dressing applied. FINDINGS: A total of approximately 500 mL of bloody fluid was removed. IMPRESSION: Successful ultrasound guided RIGHT thoracentesis yielding 500 mL of pleural fluid. Procedure performed by: Sherrilee Bal, PA-C under the supervision of Thom Hall, MD Electronically Signed   By: Thom Hall M.D.   On: 09/28/2023 11:53   DG Chest 2 View Result Date: 09/27/2023 CLINICAL DATA:  Shortness of breath, chest tightness. EXAM: CHEST - 2 VIEW COMPARISON:  September 23, 2023. FINDINGS: Stable cardiomediastinal silhouette. Right internal jugular Port-A-Cath is unchanged. Stable loculated pleural effusions are noted bilaterally, left greater than right. Associated bibasilar scarring or atelectasis is noted. Bony thorax is unremarkable. IMPRESSION: Stable loculated bilateral pleural effusions with associated bibasilar scarring or atelectasis. Electronically Signed   By: Lynwood Landy Raddle M.D.   On: 09/27/2023 13:26   DG Chest Portable 1 View Result Date: 09/23/2023 CLINICAL DATA:  sob EXAM: PORTABLE CHEST - 1 VIEW COMPARISON:  September 22, 2023 FINDINGS: Unchanged small right and moderate volume left pleural effusion, likely loculated, with left mid and lower lung zone atelectasis. No new airspace consolidation or pneumothorax. No cardiomegaly. Right chest port in place terminating at the cavoatrial junction. Aortic atherosclerosis. No acute fracture or destructive lesion. IMPRESSION: No interval significant change to the small right and moderate volume left pleural effusions. Electronically Signed   By: Rogelia Myers M.D.   On: 09/23/2023 11:34   US  THORACENTESIS ASP PLEURAL SPACE W/IMG GUIDE Result Date: 09/22/2023 INDICATION: 73 year old male with a history of left lower lobe lung cancer with recurrent pleural effusions who presented to the ED with worsening shortness of breath. Request for therapeutic thoracentesis. EXAM: ULTRASOUND  GUIDED RIGHT THORACENTESIS MEDICATIONS: 1% lidocaine , 7 mL. COMPLICATIONS: None immediate. PROCEDURE: An ultrasound guided thoracentesis was thoroughly discussed with the patient and questions answered. The benefits, risks, alternatives and complications were also discussed. The patient understands and wishes to proceed with the procedure. Written consent was obtained. Ultrasound was performed to localize and mark an adequate pocket of fluid in the right chest. The area was then prepped and draped in the normal sterile fashion. 1% Lidocaine  was used for local anesthesia. Under ultrasound guidance a 6 Fr Safe-T-Centesis catheter was introduced. Thoracentesis was  performed. The catheter was removed and a dressing applied. FINDINGS: A total of approximately 500 mL of bloody fluid was removed. IMPRESSION: Successful ultrasound guided right thoracentesis yielding 500 mL of pleural fluid. Procedure performed by: Sherrilee Bal, PA-C under the supervision of Dr. DELENA Balder Electronically Signed   By: Juliene Balder M.D.   On: 09/22/2023 16:41   DG Chest Port 1 View Result Date: 09/22/2023 CLINICAL DATA:  Status post right thoracentesis. EXAM: PORTABLE CHEST 1 VIEW COMPARISON:  Earlier today. FINDINGS: Small to moderate-sized right pleural effusion with a decrease in amount. No pneumothorax. Stable partially loculated moderate-sized left pleural effusion and perihilar density. Stable right jugular porta catheter. Unremarkable bones. IMPRESSION: 1. Small to moderate-sized right pleural effusion with a decrease in amount. No pneumothorax. 2. Stable partially loculated moderate-sized left pleural effusion and perihilar density. Electronically Signed   By: Elspeth Bathe M.D.   On: 09/22/2023 15:47   DG Chest 2 View Result Date: 09/22/2023 CLINICAL DATA:  Shortness of breath. EXAM: CHEST - 2 VIEW COMPARISON:  09/07/2023 FINDINGS: Right IJ Port-A-Cath unchanged. Moderate size left effusion unchanged. Stable left perihilar  opacification. Interval worsening moderate size right pleural effusion. Likely associated right basilar atelectasis. Cardiomediastinal silhouette and remainder of the exam is unchanged. IMPRESSION: 1. Interval worsening moderate size right pleural effusion with likely associated right basilar atelectasis. 2. Stable moderate size left effusion. Stable left perihilar opacification. Electronically Signed   By: Toribio Agreste M.D.   On: 09/22/2023 11:53   US  THORACENTESIS ASP PLEURAL SPACE W/IMG GUIDE Result Date: 09/07/2023 INDICATION: 73 year old male with a history of left lower lobe lung cancer with recurrent pleural effusions. Request for therapeutic thoracentesis. EXAM: ULTRASOUND GUIDED RIGHT THORACENTESIS MEDICATIONS: 1% lidocaine , 6 mL. COMPLICATIONS: None immediate. PROCEDURE: An ultrasound guided thoracentesis was thoroughly discussed with the patient and questions answered. The benefits, risks, alternatives and complications were also discussed. The patient understands and wishes to proceed with the procedure. Written consent was obtained. Ultrasound was performed to localize and mark an adequate pocket of fluid in the right chest. The area was then prepped and draped in the normal sterile fashion. 1% Lidocaine  was used for local anesthesia. Under ultrasound guidance a 6 Fr Safe-T-Centesis catheter was introduced. Thoracentesis was performed. The catheter was removed and a dressing applied. FINDINGS: A total of approximately 850 mL of blood-tinged fluid was removed. IMPRESSION: Successful ultrasound guided right thoracentesis yielding 850 mL of pleural fluid. Procedure performed by: Sherrilee Bal, PA-C under the supervision of Dr. KANDICE Moan Electronically Signed   By: Marcey Moan M.D.   On: 09/07/2023 10:33   DG Chest Port 1 View Result Date: 09/07/2023 CLINICAL DATA:  History of lung carcinoma and status post right thoracentesis. EXAM: PORTABLE CHEST 1 VIEW COMPARISON:  08/23/2023 FINDINGS:  Stable heart size and positioning of Port-A-Cath. No pneumothorax after right thoracentesis. No significant residual right pleural fluid. Smaller lateral loculated hydropneumothorax on the left. Stable appearance left lower lung/hilar mass. Stable additional basilar component of pleural fluid on the left. No pulmonary edema. IMPRESSION: 1. No pneumothorax after right thoracentesis. No significant residual right pleural fluid. 2. Smaller lateral loculated hydropneumothorax on the left. Stable appearance of left lower lung/hilar mass and additional basilar component of pleural fluid on the left. Electronically Signed   By: Marcey Moan M.D.   On: 09/07/2023 10:23     Assessment and plan- Patient is a 73 y.o. male with h/o recurrent adenocarcinoma of the lung here for next cycle of gemcitabine  patient was  supposed to get Cycle 6-day 8 of gemcitabine  chemotherapy today.  However he is complaining of significant chest pain.  He has had similar symptoms in the past which could be from recurrent pleural effusion as well.  Just 2 days ago patient had right thoracentesis and 500 cc of fluid was drained but with with ongoing symptoms and asking him to go to the ER to get a CT angio chest to rule out PE and to evaluate other causes of chest pain.  Patient has had recurrent loculated pleural effusion on the left and was seen by Dr. Kerrin and underwent decortication as well in the past.  We have given him 1 dose of morphine  in the clinic today and asked him to go to the ER   Visit Diagnosis 1. 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)      Dr. Annah Skene, MD, MPH [facility] [phone] 10/01/2023 3:14 PM

## 2023-10-01 NOTE — ED Notes (Signed)
 Fall bundle in place for the pt.

## 2023-10-02 ENCOUNTER — Ambulatory Visit

## 2023-10-02 ENCOUNTER — Observation Stay

## 2023-10-02 DIAGNOSIS — J449 Chronic obstructive pulmonary disease, unspecified: Secondary | ICD-10-CM | POA: Diagnosis not present

## 2023-10-02 DIAGNOSIS — J9601 Acute respiratory failure with hypoxia: Secondary | ICD-10-CM | POA: Diagnosis not present

## 2023-10-02 DIAGNOSIS — C7800 Secondary malignant neoplasm of unspecified lung: Secondary | ICD-10-CM | POA: Diagnosis not present

## 2023-10-02 DIAGNOSIS — J90 Pleural effusion, not elsewhere classified: Secondary | ICD-10-CM | POA: Diagnosis not present

## 2023-10-02 DIAGNOSIS — R0789 Other chest pain: Secondary | ICD-10-CM | POA: Diagnosis not present

## 2023-10-02 DIAGNOSIS — D75839 Thrombocytosis, unspecified: Secondary | ICD-10-CM | POA: Diagnosis present

## 2023-10-02 DIAGNOSIS — R0609 Other forms of dyspnea: Secondary | ICD-10-CM | POA: Diagnosis present

## 2023-10-02 DIAGNOSIS — E43 Unspecified severe protein-calorie malnutrition: Secondary | ICD-10-CM | POA: Diagnosis present

## 2023-10-02 DIAGNOSIS — D630 Anemia in neoplastic disease: Secondary | ICD-10-CM | POA: Diagnosis present

## 2023-10-02 DIAGNOSIS — F419 Anxiety disorder, unspecified: Secondary | ICD-10-CM | POA: Diagnosis not present

## 2023-10-02 DIAGNOSIS — Z888 Allergy status to other drugs, medicaments and biological substances status: Secondary | ICD-10-CM | POA: Diagnosis not present

## 2023-10-02 DIAGNOSIS — E559 Vitamin D deficiency, unspecified: Secondary | ICD-10-CM | POA: Diagnosis present

## 2023-10-02 DIAGNOSIS — C801 Malignant (primary) neoplasm, unspecified: Secondary | ICD-10-CM | POA: Diagnosis present

## 2023-10-02 DIAGNOSIS — C7802 Secondary malignant neoplasm of left lung: Secondary | ICD-10-CM | POA: Diagnosis present

## 2023-10-02 DIAGNOSIS — Z85118 Personal history of other malignant neoplasm of bronchus and lung: Secondary | ICD-10-CM | POA: Diagnosis not present

## 2023-10-02 DIAGNOSIS — Z79899 Other long term (current) drug therapy: Secondary | ICD-10-CM | POA: Diagnosis not present

## 2023-10-02 DIAGNOSIS — J910 Malignant pleural effusion: Secondary | ICD-10-CM | POA: Diagnosis present

## 2023-10-02 DIAGNOSIS — Z9221 Personal history of antineoplastic chemotherapy: Secondary | ICD-10-CM | POA: Diagnosis not present

## 2023-10-02 DIAGNOSIS — Z87891 Personal history of nicotine dependence: Secondary | ICD-10-CM | POA: Diagnosis not present

## 2023-10-02 DIAGNOSIS — R0602 Shortness of breath: Secondary | ICD-10-CM | POA: Diagnosis not present

## 2023-10-02 DIAGNOSIS — R590 Localized enlarged lymph nodes: Secondary | ICD-10-CM | POA: Diagnosis present

## 2023-10-02 DIAGNOSIS — E785 Hyperlipidemia, unspecified: Secondary | ICD-10-CM | POA: Diagnosis present

## 2023-10-02 DIAGNOSIS — R079 Chest pain, unspecified: Secondary | ICD-10-CM | POA: Diagnosis present

## 2023-10-02 DIAGNOSIS — I10 Essential (primary) hypertension: Secondary | ICD-10-CM | POA: Diagnosis present

## 2023-10-02 DIAGNOSIS — J939 Pneumothorax, unspecified: Secondary | ICD-10-CM | POA: Diagnosis present

## 2023-10-02 DIAGNOSIS — Z6820 Body mass index (BMI) 20.0-20.9, adult: Secondary | ICD-10-CM | POA: Diagnosis not present

## 2023-10-02 DIAGNOSIS — F989 Unspecified behavioral and emotional disorders with onset usually occurring in childhood and adolescence: Secondary | ICD-10-CM | POA: Diagnosis not present

## 2023-10-02 DIAGNOSIS — J479 Bronchiectasis, uncomplicated: Secondary | ICD-10-CM | POA: Diagnosis present

## 2023-10-02 LAB — PHOSPHORUS: Phosphorus: 3.3 mg/dL (ref 2.5–4.6)

## 2023-10-02 LAB — MAGNESIUM: Magnesium: 2.2 mg/dL (ref 1.7–2.4)

## 2023-10-02 LAB — CBC
HCT: 28.5 % — ABNORMAL LOW (ref 39.0–52.0)
Hemoglobin: 8.7 g/dL — ABNORMAL LOW (ref 13.0–17.0)
MCH: 25.6 pg — ABNORMAL LOW (ref 26.0–34.0)
MCHC: 30.5 g/dL (ref 30.0–36.0)
MCV: 83.8 fL (ref 80.0–100.0)
Platelets: 390 K/uL (ref 150–400)
RBC: 3.4 MIL/uL — ABNORMAL LOW (ref 4.22–5.81)
RDW: 17.8 % — ABNORMAL HIGH (ref 11.5–15.5)
WBC: 5.9 K/uL (ref 4.0–10.5)
nRBC: 0 % (ref 0.0–0.2)

## 2023-10-02 LAB — BASIC METABOLIC PANEL WITH GFR
Anion gap: 8 (ref 5–15)
BUN: 15 mg/dL (ref 8–23)
CO2: 28 mmol/L (ref 22–32)
Calcium: 8.4 mg/dL — ABNORMAL LOW (ref 8.9–10.3)
Chloride: 103 mmol/L (ref 98–111)
Creatinine, Ser: 0.95 mg/dL (ref 0.61–1.24)
GFR, Estimated: >60 mL/min (ref >60)
Glucose, Bld: 120 mg/dL — ABNORMAL HIGH (ref 70–99)
Potassium: 3.5 mmol/L (ref 3.5–5.1)
Sodium: 139 mmol/L (ref 135–145)

## 2023-10-02 LAB — VITAMIN D 25 HYDROXY (VIT D DEFICIENCY, FRACTURES): Vit D, 25-Hydroxy: 25.66 ng/mL — ABNORMAL LOW (ref 30–100)

## 2023-10-02 MED ORDER — FENTANYL CITRATE (PF) 100 MCG/2ML IJ SOLN
INTRAMUSCULAR | Status: AC | PRN
  Administered 2023-10-02: 50 ug via INTRAVENOUS

## 2023-10-02 MED ORDER — POLYSACCHARIDE IRON COMPLEX 150 MG PO CAPS
150.0000 mg | ORAL_CAPSULE | Freq: Every day | ORAL | Status: DC
  Administered 2023-10-02 – 2023-10-06 (×5): 150 mg via ORAL
  Filled 2023-10-02 (×5): qty 1

## 2023-10-02 MED ORDER — ADULT MULTIVITAMIN W/MINERALS CH
1.0000 | ORAL_TABLET | Freq: Every day | ORAL | Status: DC
  Administered 2023-10-03 – 2023-10-06 (×4): 1 via ORAL
  Filled 2023-10-02 (×4): qty 1

## 2023-10-02 MED ORDER — FENTANYL CITRATE (PF) 100 MCG/2ML IJ SOLN
INTRAMUSCULAR | Status: AC
  Filled 2023-10-02: qty 2

## 2023-10-02 MED ORDER — VITAMIN C 500 MG PO TABS
500.0000 mg | ORAL_TABLET | Freq: Every day | ORAL | Status: DC
  Administered 2023-10-02 – 2023-10-06 (×5): 500 mg via ORAL
  Filled 2023-10-02 (×5): qty 1

## 2023-10-02 MED ORDER — MIDAZOLAM HCL 2 MG/2ML IJ SOLN
INTRAMUSCULAR | Status: AC | PRN
  Administered 2023-10-02: 1 mg via INTRAVENOUS

## 2023-10-02 MED ORDER — ACETAMINOPHEN 325 MG PO TABS
650.0000 mg | ORAL_TABLET | Freq: Three times a day (TID) | ORAL | Status: DC
  Administered 2023-10-02 – 2023-10-06 (×11): 650 mg via ORAL
  Filled 2023-10-02 (×11): qty 2

## 2023-10-02 MED ORDER — LORAZEPAM 0.5 MG PO TABS
0.5000 mg | ORAL_TABLET | ORAL | Status: DC | PRN
  Administered 2023-10-02 – 2023-10-06 (×11): 0.5 mg via ORAL
  Filled 2023-10-02 (×11): qty 1

## 2023-10-02 MED ORDER — MIDAZOLAM HCL 2 MG/2ML IJ SOLN
INTRAMUSCULAR | Status: AC
Start: 2023-10-02 — End: 2023-10-02
  Filled 2023-10-02: qty 2

## 2023-10-02 MED ORDER — OXYCODONE HCL 5 MG PO TABS
5.0000 mg | ORAL_TABLET | Freq: Three times a day (TID) | ORAL | Status: DC
  Administered 2023-10-02 – 2023-10-03 (×3): 5 mg via ORAL
  Filled 2023-10-02 (×3): qty 1

## 2023-10-02 NOTE — Progress Notes (Signed)
 Patient clinically stable post Chest tube placement per Dr McCullough,tolerated well. Vitals stable post procedure. Drained pinkish red fluid of 150 ml. Attached to pleurovac system/closed. Received Versed  1 mg along with fentanyl  50 mcg IV for procedure. Report given to floor nurse post procedure/recovery.

## 2023-10-02 NOTE — Plan of Care (Signed)
  Problem: Education: Goal: Knowledge of General Education information will improve Description: Including pain rating scale, medication(s)/side effects and non-pharmacologic comfort measures Outcome: Progressing   Problem: Clinical Measurements: Goal: Ability to maintain clinical measurements within normal limits will improve Outcome: Progressing Goal: Will remain free from infection Outcome: Progressing   Problem: Nutrition: Goal: Adequate nutrition will be maintained Outcome: Progressing   Problem: Elimination: Goal: Will not experience complications related to bowel motility Outcome: Progressing Goal: Will not experience complications related to urinary retention Outcome: Progressing   Problem: Pain Managment: Goal: General experience of comfort will improve and/or be controlled Outcome: Progressing

## 2023-10-02 NOTE — Progress Notes (Signed)
 [facility] Progress Note  Patient: Domonick Sittner    FMW:[id number]  DOA: 10/01/2023     Date of Service: the patient was seen and examined on 10/02/2023  Chief Complaint  Patient presents with   Chest Pain   Shortness of Breath   Brief hospital course:  Nayson Traweek is a 73 y.o. male with Past medical history of adenocarcinoma of lung and COPD, is reviewed from EMR, presented to [facility] ED with complaining of recurrent chest pain and shortness of breath.  Patient has recurrent pleural effusion, multiple thoracentesis were done, recent thoracentesis was done on 8/14, 500 mL fluid was tapped from right side.  Patient returned back to ED with same complaint of shortness of breath and chest pain.  In the ED patient was noticed to have dyspnea on exertion and hypoxia so admission was requested.  Case was also discussed with pulmonary.     ED Course: Afebrile, HR 112, RR 24, BP 131/89, 96% at rest on room air, O2 sat drop on exertion to 88% BMP glucose 110, rest within normal range CBC Hb 10.7, anemia and thrombocytosis platelet count 441   CTA chest: 1. No evidence of pulmonary embolus. 2. Stable right middle lobe pulmonary nodules, metastatic disease cannot be excluded. Continued attention on follow-up recommended. 3. Loculated right pleural effusion, decreased since prior study. Minimal right pleural gas consistent with recent thoracentesis 09/28/2023. 4. Continued large multiloculated left pleural effusion, with resolution of the left pleural gas seen previously. 5. Stable left perihilar consolidation with air bronchograms and bronchiectasis. This may reflect post therapeutic change.   Procedures  8/18 Successful placement of 14 French chest tube into the left complex pleural effusion.   Assessment and Plan:  # Left pleural effusion CTA showed no PE, loculated left middle lobe pleural effusion Consulted pulmonary Consulted IR, chest tube was inserted by IR on 8/18 Fluid sent  for culture   # Recurrent pleural effusion due to underlying lung cancer S/p multiple thoracentesis, recent thoracentesis was done on 8/14 CT chest as above, shows bilateral pleural effusion Follow pulmonary consult   # Dyspnea on exertion and hypoxia Continue supplemental O2 halogen and symptomatic treatment Check O2 sats on ambulation RN was advised to qualify patient for home oxygen and arrange for home O2 TOC consulted     # COPD No exacerbation noticed on my exam Continue Anoro Ellipta  inhaler DuoNeb as needed     # Lung cancer, adenocarcinoma s/p chemotherapy Continue to follow with oncologist as an outpatient     Body mass index is 20.37 kg/m.  Interventions:  Diet: Regular diet DVT Prophylaxis: Subcutaneous Lovenox    Advance goals of care discussion: Full code  Family Communication: family was present at bedside, at the time of interview.  The pt provided permission to discuss medical plan with the family. Opportunity was given to ask question and all questions were answered satisfactorily.   Disposition:  Pt is from Home, admitted with bilateral pleural effusion due to lung Cance, s/p chest tube inserted by IR on 8/18, which precludes a safe discharge. Discharge to home, when cleared by pulmonary.  Subjective: No significant events overnight, patient denies any chest pain, still has mild shortness of breath and dyspnea on exertion.  No any other complaints.  Physical Exam: General: NAD, lying comfortably Appear in no distress, affect appropriate Eyes: PERRLA ENT: Oral Mucosa Clear, moist  Neck: no JVD,  Cardiovascular: S1 and S2 Present, no Murmur,  Respiratory: Equal air entry bilaterally, decreased breath sounds  in the bilateral lower chest.  Mild crackles, no wheezes.   Abdomen: Bowel Sound present, Soft and no tenderness,  Skin: no rashes Extremities: no Pedal edema, no calf tenderness Neurologic: without any new focal findings Gait not checked due  to patient safety concerns  Vitals:   10/02/23 1414 10/02/23 1445 10/02/23 1451 10/02/23 1454  BP:  (!) 145/92 133/88 133/88  Pulse:  97 98 96  Resp:  12 (!) 29 14  Temp: 98.4 F (36.9 C)     TempSrc:      SpO2:  100% 100% 100%  Weight:      Height:        Intake/Output Summary (Last 24 hours) at 10/02/2023 1609 Last data filed at 10/02/2023 1500 Gross per 24 hour  Intake 3 ml  Output 750 ml  Net -747 ml   Filed Weights   10/01/23 0851  Weight: 64.4 kg    Data Reviewed: I have personally reviewed and interpreted daily labs, tele strips, imagings as discussed above. I reviewed all nursing notes, pharmacy notes, vitals, pertinent old records I have discussed plan of care as described above with RN and patient/family.  CBC: Recent Labs  Lab 09/27/23 1023 09/27/23 1203 10/01/23 0921 10/02/23 0532  WBC 3.5* 3.6* 9.4 5.9  NEUTROABS 2.3  --   --   --   HGB 9.0* 9.2* 10.7* 8.7*  HCT 29.2* 29.5* 34.9* 28.5*  MCV 83.7 85.0 84.7 83.8  PLT 379 338 441* 390   Basic Metabolic Panel: Recent Labs  Lab 09/27/23 1023 09/27/23 1203 10/01/23 0921 10/01/23 1140 10/02/23 0532  NA 137 138 140  --  139  K 3.2* 3.4* 3.5  --  3.5  CL 105 106 102  --  103  CO2 24 25 24   --  28  GLUCOSE 148* 100* 110*  --  120*  BUN 12 13 12   --  15  CREATININE 0.86 0.86 0.87  --  0.95  CALCIUM 8.7* 8.7* 9.1  --  8.4*  MG  --   --   --  2.2 2.2  PHOS  --   --   --  3.0 3.3    Studies: CT [facility] PLEURAL DRAIN W/INDWELL CATH W/IMG GUIDE Result Date: 10/02/2023 INDICATION: 73 year old male with complex left pleural effusion/empyema. He presents for placement of a chest tube. EXAM: CT-guided chest tube placement TECHNIQUE: Multidetector CT imaging of the chest was performed following the standard protocol without IV contrast. RADIATION DOSE REDUCTION: This exam was performed according to the departmental dose-optimization program which includes automated exposure control, adjustment of the mA and/or  kV according to patient size and/or use of iterative reconstruction technique. MEDICATIONS: The patient is currently admitted to the hospital and receiving intravenous antibiotics. The antibiotics were administered within an appropriate time frame prior to the initiation of the procedure. ANESTHESIA/SEDATION: Moderate (conscious) sedation was employed during this procedure. A total of Versed  1 mg and Fentanyl  50 mcg was administered intravenously by the radiology nurse. Total intra-service moderate Sedation Time: 13 minutes. The patient's level of consciousness and vital signs were monitored continuously by radiology nursing throughout the procedure under my direct supervision. COMPLICATIONS: None immediate. PROCEDURE: Informed written consent was obtained from the patient after a thorough discussion of the procedural risks, benefits and alternatives. All questions were addressed. Maximal Sterile Barrier Technique was utilized including caps, mask, sterile gowns, sterile gloves, sterile drape, hand hygiene and skin antiseptic. A timeout was performed prior to the initiation of the procedure.  A planning axial CT scan was performed. The loculated left pleural effusion was identified. A suitable skin entry site was selected and marked. Local anesthesia was attained by infiltration with 1% lidocaine . A small dermatotomy was made. Under intermittent CT guidance, an 18 gauge trocar needle was advanced over a rib and into the fluid collection. A 0.035 wire was then coiled in the fluid collection. The percutaneous tract was dilated to 14 [place]. A Cook 14 [place] all-purpose drainage catheter modified with additional sideholes was advanced over the wire and formed. Aspiration yields turbid bloody fluid. Samples were sent for Gram stain and culture. The tube was then connected to low wall suction via a pleura vac atrium. The catheter was secured to the skin with 0 Prolene suture. Bandages were applied. IMPRESSION: Successful  placement of 14 French chest tube into the left complex pleural effusion. Electronically Signed   By: Wilkie Lent M.D.   On: 10/02/2023 15:50    Scheduled Meds:  vitamin C   500 mg Oral Daily   enoxaparin  (LOVENOX ) injection  40 mg Subcutaneous Q24H   feeding supplement  237 mL Oral BID BM   iron  polysaccharides  150 mg Oral Daily   megestrol   40 mg Oral Daily   sodium chloride  flush  3 mL Intravenous Q12H   umeclidinium-vilanterol  1 puff Inhalation Daily   Continuous Infusions: PRN Meds: acetaminophen  **OR** acetaminophen , bisacodyl , ipratropium-albuterol , morphine  injection, ondansetron  **OR** ondansetron  (ZOFRAN ) IV, oxyCODONE , polyethylene glycol, sodium chloride  flush  Time spent: 55 minutes  Author: ELVAN SOR. MD [facility] 10/02/2023 4:09 PM  To reach On-call, see care teams to locate the attending and reach out to them via [email or web address]. If 7PM-7AM, please contact night-coverage If you still have difficulty reaching the attending provider, please page the [facility] (Director on Call) for [facility] on amion for assistance.

## 2023-10-02 NOTE — Care Management Obs Status (Signed)
 MEDICARE OBSERVATION STATUS NOTIFICATION   Patient Details  Name: Kiley Torrence MRN: [id number] Date of Birth: [birth year]   Medicare Observation Status Notification Given:  Yes    Rojelio SHAUNNA Rattler 10/02/2023, 12:49 PM

## 2023-10-02 NOTE — Plan of Care (Signed)
  Problem: Safety: Goal: Ability to remain free from injury will improve Outcome: Progressing   Problem: Pain Managment: Goal: General experience of comfort will improve and/or be controlled Outcome: Progressing   Problem: Coping: Goal: Level of anxiety will decrease Outcome: Progressing   Problem: Nutrition: Goal: Adequate nutrition will be maintained Outcome: Progressing   Problem: Activity: Goal: Risk for activity intolerance will decrease Outcome: Progressing   Problem: Clinical Measurements: Goal: Will remain free from infection Outcome: Progressing   Problem: Clinical Measurements: Goal: Diagnostic test results will improve Outcome: Progressing

## 2023-10-02 NOTE — Procedures (Signed)
 Interventional Radiology Procedure Note  Procedure: CT guided placement of a 85F left chest tube  Complications: None  Estimated Blood Loss: None  Recommendations: - Fluid sent for culture - Drain to LWS via Atrium   Signed,  Wilkie LOIS Lent, MD

## 2023-10-02 NOTE — Consult Note (Signed)
 Chief Complaint: Patient was seen in consultation today for large multiloculated left pleural effusion with possible empyema, and with consideration for chest tube placement.  Referring Provider(s): Dr.  Fuad Frye. MD  Supervising Physician: Mitchell Frye  Patient Status: Mitchell Frye - In-pt  Patient is Full Code  History of Present Illness: Mitchell Frye is a 73 y.o. male  with PMHx notable for left lung adenocarcinoma, HTN, and HLD.  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  No evidence of distant metastatic disease.  Pathology was consistent with non-small cell lung cancer favor adenocarcinoma.  He had chemotherapy in 2022.  Had recurrent pleural effusions with associated atelectasis.  Previous cytology from pleural biopsy showed metastatic implants with NSCLC.  He had decortication march 2025.  Patient presented to ED yesterday with respiratory distress. CT Angio Chest PE was notable for loculated right pleural effusion, decreased since prior study, as well as continued large multiloculated left pleural effusion.  Interventional Radiology was requested for left chest tube placement with thoracentesis. Request was reviewed and approved by Dr. Karalee. Patient is scheduled for same in IR today.   Patient is alert and laying in bed, calm. Family member at his side. Patient currently remains with some chest tightness and discomfort, but is improved with regards to shortness of breath. He is on 2 L of O2. Patient denies any fevers, headache, cough, abdominal pain, nausea, vomiting or bleeding.    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 [facility])     Past Surgical History:   Procedure Laterality Date   CHEST TUBE INSERTION Right 04/25/2023   Procedure: RIGHT PLEURAL CHEST TUBE INSERTION;  Surgeon: Mitchell Elspeth BROCKS, MD;  Location: [facility] OR;  Service: Thoracic;  Laterality: Right;   ESOPHAGOGASTRODUODENOSCOPY (EGD) WITH PROPOFOL  N/A 12/01/2020   Procedure: ESOPHAGOGASTRODUODENOSCOPY (EGD) WITH PROPOFOL ;  Surgeon: Unk Mitchell Skiff, MD;  Location: [facility] ENDOSCOPY;  Service: Gastroenterology;  Laterality: N/A;   IR THORACENTESIS ASP PLEURAL SPACE W/IMG GUIDE  12/26/2022   PERICARDIAL FLUID DRAINAGE N/A 04/25/2023   Procedure: DRAINAGE, PERICARDIAL EFFUSION;  Surgeon: Mitchell Elspeth BROCKS, MD;  Location: [facility] OR;  Service: Thoracic;  Laterality: N/A;   PERICARDIAL WINDOW N/A 04/25/2023   Procedure: CREATION, PERICARDIAL WINDOW;  Surgeon: Mitchell Elspeth BROCKS, MD;  Location: [facility] OR;  Service: Thoracic;  Laterality: N/A;   PLEURAL EFFUSION DRAINAGE Bilateral 04/25/2023   Procedure: DRAINAGE, PLEURAL EFFUSION;  Surgeon: Mitchell Elspeth BROCKS, MD;  Location: [facility] OR;  Service: Thoracic;  Laterality: Bilateral;   PORTA CATH INSERTION N/A 10/22/2019   Procedure: PORTA CATH INSERTION;  Surgeon: Mitchell Cordella MATSU, MD;  Location: [facility] INVASIVE CV LAB;  Service: Cardiovascular;  Laterality: N/A;   VIDEO ASSISTED THORACOSCOPY (VATS)/DECORTICATION Left 04/25/2023   Procedure: VIDEO ASSISTED THORACOSCOPY (VATS)/DECORTICATION;  Surgeon: Mitchell Elspeth BROCKS, MD;  Location: [facility];  Service: Thoracic;  Laterality: Left;   VIDEO BRONCHOSCOPY WITH ENDOBRONCHIAL ULTRASOUND N/A 09/25/2019   Procedure: VIDEO BRONCHOSCOPY WITH ENDOBRONCHIAL ULTRASOUND;  Surgeon: Mitchell Dedra CROME, MD;  Location: [facility] ORS;  Service: Pulmonary;  Laterality: N/A;    Allergies: Taxol  [paclitaxel ]  Medications: Prior to Admission medications   Medication Sig Start Date End Date Taking? Authorizing Provider  acetaminophen  (  TYLENOL ) 500 MG tablet Take 500 mg by mouth every 6 (six) hours as needed for mild pain  (pain score 1-3) (takes as needed for generalized pain.  Denies pain at this time.).   Yes [provider]  albuterol  (VENTOLIN  HFA) 108 (90 Base) MCG/ACT inhaler INHALE 2 PUFFS INTO THE LUNGS EVERY 4 HOURS AS NEEDED FOR WHEEZING OR SHORTNESS OF BREATH 06/22/23  Yes Melanee Annah BROCKS, MD  lidocaine -prilocaine  (EMLA ) cream Apply to affected area once 05/17/23  Yes Melanee Annah BROCKS, MD  ondansetron  (ZOFRAN ) 8 MG tablet Take 1 tablet (8 mg total) by mouth every 8 (eight) hours as needed for nausea or vomiting. 05/17/23  Yes Melanee Annah BROCKS, MD  oxyCODONE  (OXY IR/ROXICODONE ) 5 MG immediate release tablet Take 1 tablet (5 mg total) by mouth every 6 (six) hours as needed. 09/27/23  Yes Melanee Annah BROCKS, MD  pantoprazole  (PROTONIX ) 20 MG tablet Take 1 tablet (20 mg total) by mouth every morning. 07/05/23  Yes Frye, Mitchell C, MD  prochlorperazine  (COMPAZINE ) 10 MG tablet Take 1 tablet (10 mg total) by mouth every 6 (six) hours as needed for nausea or vomiting. 05/17/23  Yes Melanee Annah BROCKS, MD  umeclidinium-vilanterol (ANORO ELLIPTA ) 62.5-25 MCG/ACT AEPB Inhale 1 puff into the lungs daily. 05/16/23  Yes Frye, Darrin, MD  megestrol  (MEGACE ) 40 MG tablet Take 1 tablet (40 mg total) by mouth daily. Patient not taking: Reported on 10/01/2023 08/16/23   Melanee Annah BROCKS, MD  potassium chloride  SA (KLOR-CON  M) 20 MEQ tablet TAKE 1 TABLET(20 MEQ) BY MOUTH DAILY Patient not taking: Reported on 10/01/2023 07/11/23   Melanee Annah BROCKS, MD     Family History  Problem Relation Age of Onset   Cancer Brother     Social History   Socioeconomic History   Marital status: Single    Spouse name: Not on file   Number of children: Not on file   Years of education: Not on file   Highest education level: Not on file  Occupational History   Not on file  Tobacco Use   Smoking status: Former    Current packs/day: 0.00    Average packs/day: 1 pack/day for 20.0 years (20.0 ttl pk-yrs)    Types: Cigarettes    Start date: 58     Quit date: 18    Years since quitting: 31.6   Smokeless tobacco: Never  Vaping Use   Vaping status: Never Used  Substance and Sexual Activity   Alcohol use: Not Currently    Comment: not drank any beer in 2 onths   Drug use: Never   Sexual activity: Not Currently  Other Topics Concern   Not on file  Social History Narrative   Not on file   Social Drivers of Health   Financial Resource Strain: Not on file  Food Insecurity: No Food Insecurity (10/01/2023)   Hunger Vital Sign    Worried About Running Out of Food in the Last Year: Never true    Ran Out of Food in the Last Year: Never true  Transportation Needs: No Transportation Needs (10/01/2023)   PRAPARE - Administrator, Civil Service (Medical): No    Lack of Transportation (Non-Medical): No  Physical Activity: Not on file  Stress: Not on file  Social Connections: Socially Isolated (10/01/2023)   Social Connection and Isolation Panel    Frequency of Communication with Friends and Family: More than three times a week    Frequency of Social Gatherings with Friends  and Family: More than three times a week    Attends Religious Services: Never    Active Member of Clubs or Organizations: No    Attends Banker Meetings: Never    Marital Status: Never married     Review of Systems: A 12 point ROS discussed and pertinent positives are indicated in the HPI above.  All other systems are negative.  Vital Signs: BP 120/80 (BP Location: Right Arm)   Pulse 85   Temp (!) 97.4 F (36.3 C) (Oral)   Resp 20   Ht 5' 10 (1.778 m)   Wt 142 lb (64.4 kg)   SpO2 100%   BMI 20.37 kg/m   Advance Care Plan: The advanced care place/surrogate decision maker was discussed at the time of visit and the patient did not wish to discuss or was not able to name a surrogate decision maker or provide an advance care plan.  Physical Exam Constitutional:      Appearance: Normal appearance.  Cardiovascular:     Rate and  Rhythm: Normal rate.     Pulses:          Radial pulses are 2+ on the right side.     Heart sounds: Normal heart sounds.  Pulmonary:     Effort: Pulmonary effort is normal.     Breath sounds: Rhonchi and rales present.     Comments: Rales/ rhonchi, worse on left side than right. Skin:    General: Skin is warm and dry.  Neurological:     Mental Status: He is alert and oriented to person, place, and time.     Imaging: CT Angio Chest Pulmonary Embolism (PE) W or WO Contrast Result Date: 10/01/2023 CLINICAL DATA:  Worsening chest pain and shortness of breath for months, history of lung cancer EXAM: CT ANGIOGRAPHY CHEST WITH CONTRAST TECHNIQUE: Multidetector CT imaging of the chest was performed using the standard protocol during bolus administration of intravenous contrast. Multiplanar CT image reconstructions and MIPs were obtained to evaluate the vascular anatomy. RADIATION DOSE REDUCTION: This exam was performed according to the departmental dose-optimization program which includes automated exposure control, adjustment of the mA and/or kV according to patient size and/or use of iterative reconstruction technique. CONTRAST:  75mL OMNIPAQUE  IOHEXOL  350 MG/ML SOLN COMPARISON:  08/09/2023, 10/01/2023 FINDINGS: Cardiovascular: This is a technically adequate evaluation of the pulmonary vasculature. No filling defects or pulmonary emboli. The heart is unremarkable without pericardial effusion. No evidence of thoracic aortic aneurysm or dissection. Atherosclerosis of the aorta and coronary vasculature. Mediastinum/Nodes: No enlarged mediastinal, hilar, or axillary lymph nodes. Thyroid  gland, trachea, and esophagus demonstrate no significant findings. Lungs/Pleura: There is a small partially loculated right pleural effusion, decreased since prior study after interval thoracentesis. Small amount of gas within the pleural space consistent with thoracentesis 09/28/2023. Persistent large multiloculated left  pleural effusion, with resolution of the pleural gas seen previously. Stable dense consolidation in the left perihilar region with air bronchograms and bronchiectasis again noted. Dependent consolidation within the right lower lobe again identified, compatible with atelectasis. 6 mm subpleural right middle lobe pulmonary nodule image 68/5 not appreciably changed since prior study. 7 x 8 mm right middle lobe pulmonary nodule image 83/5 is also unchanged. No new nodules or masses. Stable emphysema.  Central airways are patent. Upper Abdomen: No acute abnormality. Musculoskeletal: No acute or destructive bony abnormalities. Reconstructed images demonstrate no additional findings. Review of the MIP images confirms the above findings. IMPRESSION: 1. No evidence of pulmonary embolus. 2.  Stable right middle lobe pulmonary nodules, metastatic disease cannot be excluded. Continued attention on follow-up recommended. 3. Loculated right pleural effusion, decreased since prior study. Minimal right pleural gas consistent with recent thoracentesis 09/28/2023. 4. Continued large multiloculated left pleural effusion, with resolution of the left pleural gas seen previously. 5. Stable left perihilar consolidation with air bronchograms and bronchiectasis. This may reflect post therapeutic change. 6. Aortic Atherosclerosis (ICD10-I70.0) and Emphysema (ICD10-J43.9). Electronically Signed   By: Ozell Daring M.D.   On: 10/01/2023 12:58   DG Chest 2 View Result Date: 10/01/2023 CLINICAL DATA:  Chest pain. EXAM: DG CHEST 2V COMPARISON:  09/28/2023 FINDINGS: Bilateral pleural effusions again noted with probable loculated component on the left. Left hilar opacity is similar to prior. Right Port-A-Cath remains in place. Cardiopericardial silhouette is at upper limits of normal for size. No acute bony abnormality. Telemetry leads overlie the chest. IMPRESSION: 1. No substantial change. Bilateral pleural effusions with probable loculated  component on the left. 2. Left hilar opacity is similar to prior. Electronically Signed   By: Camellia Candle M.D.   On: 10/01/2023 09:26   DG Chest Port 1 View Result Date: 09/28/2023 CLINICAL DATA:  s/p right thoracentesis EXAM: PORTABLE CHEST 1 VIEW COMPARISON:  IR thoracentesis, earlier same day. Chest XR, 09/27/2023. CT chest, 09/08/2023. FINDINGS: Support lines; RIGHT chest port, catheter tip well-positioned at the superior cavoatrial junction Obscured LEFT cardiac border. Patchy RIGHT lung nodular opacities, the RIGHT chest is otherwise relatively clear. Improved aeration of the RIGHT chest post thoracentesis without residual pleural effusion. No pneumothorax. Similar degree of LEFT hilar prominence, basilar mass and pleural thickening. No interval osseous abnormality. IMPRESSION: 1. Improved aeration of the RIGHT chest post thoracentesis without residual pleural effusion. No pneumothorax 2. Similar prominence of the LEFT hilum, basilar mass and pleural thickening. Electronically Signed   By: Thom Hall M.D.   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 Request for therapeutic thoracentesis. EXAM: ULTRASOUND GUIDED RIGHT THORACENTESIS MEDICATIONS: 1% lidocaine , 8 mL. COMPLICATIONS: None immediate. PROCEDURE: An ultrasound guided thoracentesis was thoroughly discussed with the patient and questions answered. The benefits, risks, alternatives and complications were also discussed. The patient understands and wishes to proceed with the procedure. Written consent was obtained. Ultrasound was performed to localize and mark an adequate pocket of fluid in the RIGHT chest. The area was then prepped and draped in the normal sterile fashion. 1% Lidocaine  was used for local anesthesia. Under ultrasound guidance a 6 Fr Safe-T-Centesis  catheter was introduced. Thoracentesis was performed. The catheter was removed and a dressing applied. FINDINGS: A total of approximately 500 mL of bloody fluid was removed. IMPRESSION: Successful ultrasound guided RIGHT thoracentesis yielding 500 mL of pleural fluid. Procedure performed by: Sherrilee Bal, PA-C under the supervision of Thom Hall, MD Electronically Signed   By: Thom Hall M.D.   On: 09/28/2023 11:53   DG Chest 2 View Result Date: 09/27/2023 CLINICAL DATA:  Shortness of breath, chest tightness. EXAM: CHEST - 2 VIEW COMPARISON:  September 23, 2023. FINDINGS: Stable cardiomediastinal silhouette. Right internal jugular Port-A-Cath is unchanged. Stable loculated pleural effusions are noted bilaterally, left greater than right. Associated bibasilar scarring or atelectasis is noted. Bony thorax is unremarkable. IMPRESSION: Stable loculated bilateral pleural effusions with associated bibasilar scarring or atelectasis. Electronically Signed   By: Lynwood Landy Mickey CHRISTELLA.D.  On: 09/27/2023 13:26   DG Chest Portable 1 View Result Date: 09/23/2023 CLINICAL DATA:  sob EXAM: PORTABLE CHEST - 1 VIEW COMPARISON:  September 22, 2023 FINDINGS: Unchanged small right and moderate volume left pleural effusion, likely loculated, with left mid and lower lung zone atelectasis. No new airspace consolidation or pneumothorax. No cardiomegaly. Right chest port in place terminating at the cavoatrial junction. Aortic atherosclerosis. No acute fracture or destructive lesion. IMPRESSION: No interval significant change to the small right and moderate volume left pleural effusions. Electronically Signed   By: Rogelia Myers M.D.   On: 09/23/2023 11:34   US  THORACENTESIS ASP PLEURAL SPACE W/IMG GUIDE Result Date: 09/22/2023 INDICATION: 73 year old male with a history of left lower lobe lung cancer with recurrent pleural effusions who presented to the ED with worsening shortness of breath. Request for therapeutic thoracentesis. EXAM:  ULTRASOUND GUIDED RIGHT THORACENTESIS MEDICATIONS: 1% lidocaine , 7 mL. COMPLICATIONS: None immediate. PROCEDURE: An ultrasound guided thoracentesis was thoroughly discussed with the patient and questions answered. The benefits, risks, alternatives and complications were also discussed. The patient understands and wishes to proceed with the procedure. Written consent was obtained. Ultrasound was performed to localize and mark an adequate pocket of fluid in the right chest. The area was then prepped and draped in the normal sterile fashion. 1% Lidocaine  was used for local anesthesia. Under ultrasound guidance a 6 Fr Safe-T-Centesis catheter was introduced. Thoracentesis was performed. The catheter was removed and a dressing applied. FINDINGS: A total of approximately 500 mL of bloody fluid was removed. IMPRESSION: Successful ultrasound guided right thoracentesis yielding 500 mL of pleural fluid. Procedure performed by: Sherrilee Bal, PA-C under the supervision of Dr. DELENA Balder Electronically Signed   By: Juliene Balder M.D.   On: 09/22/2023 16:41   DG Chest Port 1 View Result Date: 09/22/2023 CLINICAL DATA:  Status post right thoracentesis. EXAM: PORTABLE CHEST 1 VIEW COMPARISON:  Earlier today. FINDINGS: Small to moderate-sized right pleural effusion with a decrease in amount. No pneumothorax. Stable partially loculated moderate-sized left pleural effusion and perihilar density. Stable right jugular porta catheter. Unremarkable bones. IMPRESSION: 1. Small to moderate-sized right pleural effusion with a decrease in amount. No pneumothorax. 2. Stable partially loculated moderate-sized left pleural effusion and perihilar density. Electronically Signed   By: Elspeth Bathe M.D.   On: 09/22/2023 15:47   DG Chest 2 View Result Date: 09/22/2023 CLINICAL DATA:  Shortness of breath. EXAM: CHEST - 2 VIEW COMPARISON:  09/07/2023 FINDINGS: Right IJ Port-A-Cath unchanged. Moderate size left effusion unchanged. Stable left perihilar  opacification. Interval worsening moderate size right pleural effusion. Likely associated right basilar atelectasis. Cardiomediastinal silhouette and remainder of the exam is unchanged. IMPRESSION: 1. Interval worsening moderate size right pleural effusion with likely associated right basilar atelectasis. 2. Stable moderate size left effusion. Stable left perihilar opacification. Electronically Signed   By: Toribio Agreste M.D.   On: 09/22/2023 11:53   US  THORACENTESIS ASP PLEURAL SPACE W/IMG GUIDE Result Date: 09/07/2023 INDICATION: 73 year old male with a history of left lower lobe lung cancer with recurrent pleural effusions. Request for therapeutic thoracentesis. EXAM: ULTRASOUND GUIDED RIGHT THORACENTESIS MEDICATIONS: 1% lidocaine , 6 mL. COMPLICATIONS: None immediate. PROCEDURE: An ultrasound guided thoracentesis was thoroughly discussed with the patient and questions answered. The benefits, risks, alternatives and complications were also discussed. The patient understands and wishes to proceed with the procedure. Written consent was obtained. Ultrasound was performed to localize and mark an adequate pocket of fluid in the right chest. The area was then  prepped and draped in the normal sterile fashion. 1% Lidocaine  was used for local anesthesia. Under ultrasound guidance a 6 Fr Safe-T-Centesis catheter was introduced. Thoracentesis was performed. The catheter was removed and a dressing applied. FINDINGS: A total of approximately 850 mL of blood-tinged fluid was removed. IMPRESSION: Successful ultrasound guided right thoracentesis yielding 850 mL of pleural fluid. Procedure performed by: Sherrilee Bal, PA-C under the supervision of Dr. KANDICE Moan Electronically Signed   By: Marcey Moan M.D.   On: 09/07/2023 10:33   DG Chest Port 1 View Result Date: 09/07/2023 CLINICAL DATA:  History of lung carcinoma and status post right thoracentesis. EXAM: PORTABLE CHEST 1 VIEW COMPARISON:  08/23/2023 FINDINGS:  Stable heart size and positioning of Port-A-Cath. No pneumothorax after right thoracentesis. No significant residual right pleural fluid. Smaller lateral loculated hydropneumothorax on the left. Stable appearance left lower lung/hilar mass. Stable additional basilar component of pleural fluid on the left. No pulmonary edema. IMPRESSION: 1. No pneumothorax after right thoracentesis. No significant residual right pleural fluid. 2. Smaller lateral loculated hydropneumothorax on the left. Stable appearance of left lower lung/hilar mass and additional basilar component of pleural fluid on the left. Electronically Signed   By: Marcey Moan M.D.   On: 09/07/2023 10:23    Labs:  CBC: Recent Labs    09/27/23 1023 09/27/23 1203 10/01/23 0921 10/02/23 0532  WBC 3.5* 3.6* 9.4 5.9  HGB 9.0* 9.2* 10.7* 8.7*  HCT 29.2* 29.5* 34.9* 28.5*  PLT 379 338 441* 390    COAGS: Recent Labs    04/25/23 1035  INR 1.3*  APTT 36    BMP: Recent Labs    09/27/23 1023 09/27/23 1203 10/01/23 0921 10/02/23 0532  NA 137 138 140 139  K 3.2* 3.4* 3.5 3.5  CL 105 106 102 103  CO2 24 25 24 28   GLUCOSE 148* 100* 110* 120*  BUN 12 13 12 15   CALCIUM 8.7* 8.7* 9.1 8.4*  CREATININE 0.86 0.86 0.87 0.95  GFRNONAA >60 >60 >60 >60    LIVER FUNCTION TESTS: Recent Labs    08/23/23 1017 09/06/23 1352 09/20/23 1023 09/27/23 1023  BILITOT 0.8 0.5 0.7 0.6  AST 33 15 19 22   ALT 12 11 11 15   ALKPHOS 55 62 59 60  PROT 7.4 7.2 7.2 7.2  ALBUMIN  2.7* 2.8* 2.6* 2.4*    TUMOR MARKERS: No results for input(s): AFPTM, CEA, CA199, CHROMGRNA in the last 8760 hours.  Assessment and Plan: Patient presented to ED yesterday with respiratory distress. CT Angio Chest PE was notable for loculated right pleural effusion, decreased since prior study, as well as continued large multiloculated left pleural effusion.  Patient presents for tentatively scheduled left chest tube placement and thoracentesis in IR today,  with one drug regimen due to NPO status.  Patient has NOT been NPO, and ate breakfast at approximately 8 am. Patient remains on 2 L O2 by nasal cannula. All labs and medications are within acceptable parameters.  No pertinent allergies.   Risks and benefits of chest tube placement were discussed with the patient including bleeding, infection, damage to adjacent structures, malfunction of the tube requiring additional procedures and sepsis.  All of the patient's questions were answered, patient is agreeable to proceed. Consent signed and in chart.      Thank you for allowing our service to participate in Maikel Neisler 's care.  Electronically Signed: Carlin DELENA Griffon, PA-C   10/02/2023, 12:52 PM      I spent a total  of 40 Minutes in face to face in clinical consultation, greater than 50% of which was counseling/coordinating care for large multiloculated left pleural effusion with possible empyema, and with consideration for chest tube placement.

## 2023-10-03 ENCOUNTER — Inpatient Hospital Stay

## 2023-10-03 DIAGNOSIS — J910 Malignant pleural effusion: Secondary | ICD-10-CM | POA: Diagnosis not present

## 2023-10-03 DIAGNOSIS — C801 Malignant (primary) neoplasm, unspecified: Secondary | ICD-10-CM | POA: Diagnosis not present

## 2023-10-03 DIAGNOSIS — C7800 Secondary malignant neoplasm of unspecified lung: Secondary | ICD-10-CM

## 2023-10-03 DIAGNOSIS — J9601 Acute respiratory failure with hypoxia: Secondary | ICD-10-CM | POA: Diagnosis not present

## 2023-10-03 DIAGNOSIS — J90 Pleural effusion, not elsewhere classified: Secondary | ICD-10-CM | POA: Diagnosis not present

## 2023-10-03 DIAGNOSIS — E43 Unspecified severe protein-calorie malnutrition: Secondary | ICD-10-CM | POA: Insufficient documentation

## 2023-10-03 DIAGNOSIS — C7802 Secondary malignant neoplasm of left lung: Secondary | ICD-10-CM

## 2023-10-03 LAB — CBC
HCT: 33.7 % — ABNORMAL LOW (ref 39.0–52.0)
Hemoglobin: 10 g/dL — ABNORMAL LOW (ref 13.0–17.0)
MCH: 25.2 pg — ABNORMAL LOW (ref 26.0–34.0)
MCHC: 29.7 g/dL — ABNORMAL LOW (ref 30.0–36.0)
MCV: 84.9 fL (ref 80.0–100.0)
Platelets: 434 K/uL — ABNORMAL HIGH (ref 150–400)
RBC: 3.97 MIL/uL — ABNORMAL LOW (ref 4.22–5.81)
RDW: 17.7 % — ABNORMAL HIGH (ref 11.5–15.5)
WBC: 5.8 K/uL (ref 4.0–10.5)
nRBC: 0 % (ref 0.0–0.2)

## 2023-10-03 LAB — BASIC METABOLIC PANEL WITH GFR
Anion gap: 8 (ref 5–15)
BUN: 13 mg/dL (ref 8–23)
CO2: 27 mmol/L (ref 22–32)
Calcium: 8.4 mg/dL — ABNORMAL LOW (ref 8.9–10.3)
Chloride: 106 mmol/L (ref 98–111)
Creatinine, Ser: 0.95 mg/dL (ref 0.61–1.24)
GFR, Estimated: >60 mL/min (ref >60)
Glucose, Bld: 109 mg/dL — ABNORMAL HIGH (ref 70–99)
Potassium: 3.5 mmol/L (ref 3.5–5.1)
Sodium: 141 mmol/L (ref 135–145)

## 2023-10-03 LAB — MAGNESIUM: Magnesium: 2.3 mg/dL (ref 1.7–2.4)

## 2023-10-03 LAB — PHOSPHORUS: Phosphorus: 3.3 mg/dL (ref 2.5–4.6)

## 2023-10-03 MED ORDER — OXYCODONE HCL 5 MG PO TABS
5.0000 mg | ORAL_TABLET | Freq: Four times a day (QID) | ORAL | Status: DC | PRN
  Administered 2023-10-03 – 2023-10-04 (×2): 5 mg via ORAL
  Administered 2023-10-04 – 2023-10-06 (×7): 10 mg via ORAL
  Filled 2023-10-03 (×3): qty 1
  Filled 2023-10-03 (×7): qty 2

## 2023-10-03 MED ORDER — ENSURE PLUS HIGH PROTEIN PO LIQD
237.0000 mL | Freq: Three times a day (TID) | ORAL | Status: DC
  Administered 2023-10-03 – 2023-10-06 (×7): 237 mL via ORAL

## 2023-10-03 MED ORDER — VITAMIN D (ERGOCALCIFEROL) 1.25 MG (50000 UNIT) PO CAPS
50000.0000 [IU] | ORAL_CAPSULE | ORAL | Status: DC
  Administered 2023-10-03: 50000 [IU] via ORAL
  Filled 2023-10-03: qty 1

## 2023-10-03 MED ORDER — MORPHINE SULFATE (PF) 2 MG/ML IV SOLN
2.0000 mg | INTRAVENOUS | Status: DC | PRN
  Administered 2023-10-03 – 2023-10-06 (×9): 2 mg via INTRAVENOUS
  Filled 2023-10-03 (×9): qty 1

## 2023-10-03 NOTE — Plan of Care (Signed)

## 2023-10-03 NOTE — Progress Notes (Signed)
 Initial Nutrition Assessment  DOCUMENTATION CODES:   Severe malnutrition in context of chronic illness  INTERVENTION:   Ensure Plus High Protein po TID, each supplement provides 350 kcal and 20 grams of protein  Magic cup TID with meals, each supplement provides 290 kcal and 9 grams of protein  MVI po daily   Pt at high refeed risk; recommend monitor potassium, magnesium  and phosphorus labs daily until stable  Daily weights   NUTRITION DIAGNOSIS:   Severe Malnutrition related to cancer and cancer related treatments as evidenced by severe fat depletion, severe muscle depletion, 23 percent weight loss in 1 year.  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  Met with pt in room today. Pt reports fair appetite and oral intake at baseline. Pt is on megace . Pt reports that he has been eating well in hospital; pt documented to have eaten 40% of lunch. Pt reports that he has lost down from 211lbs. Per chart, pt is down 48lbs(23%) in less than one year; this is significant weight loss. Pt reports that he loves chocolate and strawberry Ensure. Recommend continue Ensure supplements; RD will add Magic Cups to meal trays. Pt is at refeed risk.   Medications reviewed and include: vitamin C , lovenox , iron , megace , MVI, vitamin D   Labs reviewed: K 3.5 wnl, P 3.3 wnl, Mg 2.3 wnl Vitamin D - 25.66(L)- 8/18 Hgb 10.0(L), Hct 33.7(L), MCH 25.2(L), MCHC 29.7(L)  NUTRITION - FOCUSED  PHYSICAL EXAM:  Flowsheet Row Most Recent Value  Orbital Region Moderate depletion  Upper Arm Region Severe depletion  Thoracic and Lumbar Region Severe depletion  Buccal Region Severe depletion  Temple Region Severe depletion  Clavicle Bone Region Severe depletion  Clavicle and Acromion Bone Region Severe depletion  Scapular Bone Region Severe depletion  Dorsal Hand Severe depletion  Patellar Region Severe depletion  Anterior Thigh Region Severe depletion  Posterior Calf Region Severe depletion  Edema (RD Assessment) None  Hair Reviewed  Eyes Reviewed  Mouth Reviewed  Skin Reviewed  Nails Reviewed   Diet Order:   Diet Order             Diet regular Room service appropriate? Yes; Fluid consistency: Thin  Diet effective now                  EDUCATION NEEDS:   Education needs have been addressed  Skin:  Skin Assessment: Reviewed RN Assessment  Last BM:  8/18  Height:   Ht Readings from Last 1 Encounters:  10/01/23 5' 10 (1.778 m)    Weight:   Wt Readings from Last 1 Encounters:  10/01/23 64.4 kg    Ideal Body Weight:  75.45 kg  BMI:  Body mass index is 20.37 kg/m.  Estimated Nutritional Needs:   Kcal:  1800-2100kcal/day  Protein:  90-105g/day  Fluid:  1.7-2.0L/day  Augustin Shams MS, RD, LDN If unable to be reached, please send secure chat to RD inpatient available from 8:00a-4:00p daily

## 2023-10-03 NOTE — Progress Notes (Signed)
 NAME:  Mitchell Frye, MRN:  [id number], DOB:  10/12/1950, LOS: 1 ADMISSION DATE:  10/01/2023, CHIEF COMPLAINT:  Pleural Effusion   History of Present Illness:   Mr. Mallek is a 73 year old male with history of stage IV lung adenocarcinoma after recent VATS biopsy and decortication showed NSCLCa deposits in the visceral pleura.  He presents to the hospital with increased shortness of breath and respiratory distress. He underwent chest tube placement yesterday.  Patient's past medical history is notable for the above mentioned lung adenocarcinoma. He was initially diagnosed in September of 2021, at which point PET showed a hypermetabolic mass in the left lower lobe with assocaited mediastinal and hilar lymph adenopathy. Pathology showed adenocarcinoma. He receive chemotherapy and radiation therapy, and subsequently developed recurrent pleural effusions with cytology returning negative.  He was seen by Dr. Kerrin from thoracic surgery, and underwent VATS for drainage of pleural effusion and partial decortication and a pericardial window on 04/25/2023. He was noted to have a trapped lung that did not completely re-expand and is expected to have a recurrent pleural effusion.  He has had thoracentesis on 12/07/2022, 12/26/2022, 01/13/2023, 09/07/2023, 09/22/2023 and 09/28/2023.  Patient admitted 8/17 with increased shortness of breath and chest tightness. He was again noted to have a recurrent pleural effusion and underwent CT guided chest tube placement with IR yesterday. With drainage of the fluid, he's felt much better.   Pertinent  Medical History  -Stage IV lung adenocarcinoma -COPD  Significant Hospital Events: Including procedures, antibiotic start and stop dates in addition to other pertinent events   8/17: admission 8/18: CT guided chest tube 8/19: improved symptoms, CT chest ordered  Interim History / Subjective:  Feels improved, less short of breath. Back to baseline. No pain  reported  Objective    Blood pressure 110/72, pulse 94, temperature 97.9 F (36.6 C), resp. rate 18, height 5' 10 (1.778 m), weight 64.4 kg, SpO2 100%.        Intake/Output Summary (Last 24 hours) at 10/03/2023 1119 Last data filed at 10/03/2023 0412 Gross per 24 hour  Intake --  Output 1000 ml  Net -1000 ml   Filed Weights   10/01/23 0851  Weight: 64.4 kg    Examination: Physical Exam Constitutional:      Appearance: He is well-developed.  Cardiovascular:     Rate and Rhythm: Normal rate and regular rhythm.     Pulses: Normal pulses.     Heart sounds: Normal heart sounds.  Pulmonary:     Breath sounds: No wheezing.     Comments: Decreased air entry bilaterally Neurological:     General: No focal deficit present.     Mental Status: He is alert and oriented to person, place, and time. Mental status is at baseline.     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 He is now s/p chest tube placement.  Given history of lung adenoCa and recurrent pleural effusion, he underwent VATS for fluid evacuation, decortication, and pericardial window. 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 His lung unfortunately would not re-expand and he has a trapped lung. This is evidenced by the pneumothorax ex-vacuo after chest tube placement.  We  will obtain a chest CT today, if the fluid is drained and there persists a pneumothorax ex-vacuo, I will go ahead and discontinue the chest tube. The patient might require placement of an indwelling pleural catheter (pleurX) to decrease the need for recurrent thoracentesis.  I will discuss this  with Dr. Kerrin as well as with the patient's primary pulmonologist, Dr. Malka.  -CT chest today -continue chest tube to -20 cm H2O of suction -we will continue to follow  [name], MD [facility] 10/03/2023 3:54 PM   Labs   CBC: Recent Labs  Lab 09/27/23 1023 09/27/23 1203 10/01/23 0921 10/02/23 0532 10/03/23 1028  WBC 3.5* 3.6* 9.4 5.9 5.8  NEUTROABS 2.3  --   --   --   --   HGB 9.0* 9.2* 10.7* 8.7* 10.0*  HCT 29.2* 29.5* 34.9* 28.5* 33.7*  MCV 83.7 85.0 84.7 83.8 84.9  PLT 379 338 441* 390 434*    Basic Metabolic Panel: Recent Labs  Lab 09/27/23 1023 09/27/23 1203 10/01/23 0921 10/01/23 1140 10/02/23 0532  NA 137 138 140  --  139  K 3.2* 3.4* 3.5  --  3.5  CL 105 106 102  --  103  CO2 24 25 24   --  28  GLUCOSE 148* 100* 110*  --  120*  BUN 12 13 12   --  15  CREATININE 0.86 0.86 0.87  --  0.95  CALCIUM 8.7* 8.7* 9.1  --  8.4*  MG  --   --   --  2.2 2.2  PHOS  --   --   --  3.0 3.3   GFR: Estimated Creatinine Clearance: 62.1 mL/min (by C-G formula based on SCr of 0.95 mg/dL). Recent Labs  Lab 09/27/23 1203 10/01/23 0921 10/02/23 0532 10/03/23 1028  WBC 3.6* 9.4 5.9 5.8    Liver Function Tests: Recent Labs  Lab 09/27/23 1023  AST 22  ALT 15  ALKPHOS 60  BILITOT 0.6  PROT 7.2  ALBUMIN  2.4*   No results for input(s): LIPASE, AMYLASE in the last 168 hours. No results for input(s): AMMONIA in the last 168 hours.  ABG No results found for: PHART, PCO2ART, PO2ART, HCO3, TCO2, ACIDBASEDEF, O2SAT   Coagulation Profile: No results for input(s): INR, PROTIME in the last 168 hours.  Cardiac Enzymes: No results for input(s): CKTOTAL, CKMB, CKMBINDEX, TROPONINI in the last 168 hours.  HbA1C: Hgb A1c MFr Bld  Date/Time Value Ref Range Status  04/26/2023 04:25 AM 5.9 (H) 4.8 - 5.6 % Final    Comment:    (NOTE) Pre diabetes:          5.7%-6.4%  Diabetes:               >6.4%  Glycemic control for   <7.0% adults with diabetes     CBG: No results for input(s): GLUCAP in the last 168 hours.  Review of Systems:   N/A  Past Medical History:  He,  has a past medical history of Dyspnea, Hyperlipidemia, Hypertension, and Primary malignant neoplasm of left lower lobe of lung (HCC).   Surgical History:   Past Surgical History:  Procedure Laterality Date   CHEST TUBE INSERTION Right 04/25/2023   Procedure: RIGHT PLEURAL CHEST TUBE INSERTION;  Surgeon: Kerrin Elspeth BROCKS, MD;  Location: [facility] OR;  Service: Thoracic;  Laterality: Right;   ESOPHAGOGASTRODUODENOSCOPY (EGD) WITH PROPOFOL  N/A 12/01/2020   Procedure: ESOPHAGOGASTRODUODENOSCOPY (EGD) WITH PROPOFOL ;  Surgeon: Unk Corinn Skiff, MD;  Location: [facility] ENDOSCOPY;  Service: Gastroenterology;  Laterality: N/A;   IR THORACENTESIS ASP PLEURAL SPACE W/IMG GUIDE  12/26/2022   PERICARDIAL FLUID DRAINAGE N/A 04/25/2023   Procedure: DRAINAGE, PERICARDIAL EFFUSION;  Surgeon: Kerrin Elspeth BROCKS, MD;  Location: [facility] OR;  Service: Thoracic;  Laterality: N/A;   PERICARDIAL WINDOW N/A 04/25/2023   Procedure: CREATION, PERICARDIAL WINDOW;  Surgeon: Kerrin Elspeth BROCKS, MD;  Location: [facility] OR;  Service: Thoracic;  Laterality: N/A;   PLEURAL EFFUSION DRAINAGE Bilateral 04/25/2023   Procedure: DRAINAGE, PLEURAL EFFUSION;  Surgeon: Kerrin Elspeth BROCKS, MD;  Location: [facility] OR;  Service: Thoracic;  Laterality: Bilateral;   PORTA CATH INSERTION N/A 10/22/2019   Procedure: PORTA CATH INSERTION;  Surgeon: Jama Cordella MATSU, MD;  Location: [facility] INVASIVE CV LAB;  Service: Cardiovascular;  Laterality: N/A;   VIDEO ASSISTED THORACOSCOPY (VATS)/DECORTICATION Left 04/25/2023   Procedure: VIDEO ASSISTED THORACOSCOPY (VATS)/DECORTICATION;  Surgeon: Kerrin Elspeth BROCKS, MD;  Location: [facility] OR;  Service: Thoracic;  Laterality: Left;   VIDEO BRONCHOSCOPY WITH ENDOBRONCHIAL ULTRASOUND N/A 09/25/2019   Procedure: VIDEO BRONCHOSCOPY WITH  ENDOBRONCHIAL ULTRASOUND;  Surgeon: Tamea Dedra CROME, MD;  Location: [facility] ORS;  Service: Pulmonary;  Laterality: N/A;     Social History:   reports that he quit smoking about 31 years ago. His smoking use included cigarettes. He started smoking about 51 years ago. He has a 20 pack-year smoking history. He has never used smokeless tobacco. He reports that he does not currently use alcohol. He reports that he does not use drugs.   Family History:  His family history includes Cancer in his brother.   Allergies Allergies  Allergen Reactions   Taxol  [Paclitaxel ] Other (See Comments)    Chest pain, hip pain, coughing , wheezing     Home Medications  Prior to Admission medications   Medication Sig Start Date End Date Taking? Authorizing Provider  acetaminophen  (TYLENOL ) 500 MG tablet Take 500 mg by mouth every 6 (six) hours as needed for mild pain (pain score 1-3) (takes as needed for generalized pain.  Denies pain at this time.).   Yes [provider]  albuterol  (VENTOLIN  HFA) 108 (90 Base) MCG/ACT inhaler INHALE 2 PUFFS INTO THE LUNGS EVERY 4 HOURS AS NEEDED FOR WHEEZING OR SHORTNESS OF BREATH 06/22/23  Yes Melanee Annah BROCKS, MD  lidocaine -prilocaine  (EMLA ) cream Apply to affected area once 05/17/23  Yes Melanee Annah BROCKS, MD  ondansetron  (ZOFRAN ) 8 MG tablet Take 1 tablet (8 mg total) by mouth every 8 (eight) hours as needed for nausea or vomiting. 05/17/23  Yes Melanee Annah BROCKS, MD  oxyCODONE  (OXY IR/ROXICODONE ) 5 MG immediate release tablet Take 1 tablet (5 mg total) by mouth every 6 (six) hours as needed. 09/27/23  Yes Melanee Annah BROCKS, MD  pantoprazole  (PROTONIX ) 20 MG tablet Take 1 tablet (20 mg total) by mouth every morning. 07/05/23  Yes Melanee Annah BROCKS, MD  prochlorperazine  (COMPAZINE ) 10 MG tablet Take 1 tablet (10 mg total) by mouth every 6 (six) hours as needed for nausea or vomiting. 05/17/23  Yes Melanee Annah BROCKS, MD  umeclidinium-vilanterol (ANORO ELLIPTA ) 62.5-25 MCG/ACT AEPB Inhale 1 puff  into the lungs daily. 05/16/23  Yes Assaker, Darrin, MD  megestrol  (MEGACE ) 40 MG tablet Take 1 tablet (40 mg total) by mouth daily. Patient not taking: Reported on 10/01/2023 08/16/23   Melanee Annah BROCKS, MD  potassium chloride  SA (KLOR-CON  M) 20 MEQ tablet TAKE 1 TABLET(20 MEQ) BY MOUTH DAILY Patient not taking: Reported on 10/01/2023 07/11/23   Melanee Annah BROCKS, MD  I spent 35 minutes caring for this patient today, including preparing to see the patient, obtaining a medical history , reviewing a separately obtained history, performing a medically appropriate examination and/or evaluation, counseling and educating the patient/family/caregiver, ordering medications, tests, or procedures, documenting clinical information in the electronic health record, and independently interpreting results (not separately reported/billed) and communicating results to the patient/family/caregiver

## 2023-10-03 NOTE — Progress Notes (Signed)
 Mobility Specialist - Progress Note   10/03/23 1517  Mobility  Activity Off unit     Pt ambulating with NS upon attempt. Will try again another date/time.    Lennette Seip Mobility Specialist 10/03/23, 3:18 PM

## 2023-10-03 NOTE — Progress Notes (Signed)
 SATURATION QUALIFICATIONS: (This note is used to comply with regulatory documentation for home oxygen)  Patient Saturations on Room Air at Rest = 93%  Patient Saturations on Room Air while Ambulating = 86%  Patient Saturations on 2 Liters of oxygen while Ambulating = 95%  Please briefly explain why patient needs home oxygen:  Patient experienced SOB and SpO2 sats dropped while ambulating on RA

## 2023-10-03 NOTE — Progress Notes (Signed)
 [facility] Progress Note  Patient: Mitchell Frye    FMW:[id number]  DOA: 10/01/2023     Date of Service: the patient was seen and examined on 10/03/2023  Chief Complaint  Patient presents with   Chest Pain   Shortness of Breath   Brief hospital course:  Mitchell Frye is a 73 y.o. male with Past medical history of adenocarcinoma of lung and COPD, is reviewed from EMR, presented to [facility] ED with complaining of recurrent chest pain and shortness of breath.  Patient has recurrent pleural effusion, multiple thoracentesis were done, recent thoracentesis was done on 8/14, 500 mL fluid was tapped from right side.  Patient returned back to ED with same complaint of shortness of breath and chest pain.  In the ED patient was noticed to have dyspnea on exertion and hypoxia so admission was requested.  Case was also discussed with pulmonary.     ED Course: Afebrile, HR 112, RR 24, BP 131/89, 96% at rest on room air, O2 sat drop on exertion to 88% BMP glucose 110, rest within normal range CBC Hb 10.7, anemia and thrombocytosis platelet count 441   CTA chest: 1. No evidence of pulmonary embolus. 2. Stable right middle lobe pulmonary nodules, metastatic disease cannot be excluded. Continued attention on follow-up recommended. 3. Loculated right pleural effusion, decreased since prior study. Minimal right pleural gas consistent with recent thoracentesis 09/28/2023. 4. Continued large multiloculated left pleural effusion, with resolution of the left pleural gas seen previously. 5. Stable left perihilar consolidation with air bronchograms and bronchiectasis. This may reflect post therapeutic change.   Procedures  8/18 Successful placement of 14 French chest tube into the left complex pleural effusion.   Assessment and Plan:  # Left pleural effusion CTA showed no PE, loculated left middle lobe pleural effusion Consulted pulmonary Consulted IR, chest tube was inserted by IR on 8/18 Fluid  culture, NGTD Follow IR and pulmonary for further recommendation and disposition plan  # Recurrent pleural effusion due to underlying lung cancer S/p multiple thoracentesis, recent thoracentesis was done on 8/14 CT chest as above, shows bilateral pleural effusion Follow pulmonary consult   # Dyspnea on exertion and hypoxia Continue supplemental O2 inhalation and symptomatic treatment Patient's O2 sats dropped on ambulation, qualified for home oxygen TOC consulted     # COPD No exacerbation noticed on my exam Continue Anoro Ellipta  inhaler DuoNeb as needed     # Lung cancer, adenocarcinoma s/p chemotherapy Continue to follow with oncologist as an outpatient     Body mass index is 20.37 kg/m.  Interventions:  Diet: Regular diet DVT Prophylaxis: Subcutaneous Lovenox    Advance goals of care discussion: Full code  Family Communication: family was present at bedside, at the time of interview.  The pt provided permission to discuss medical plan with the family. Opportunity was given to ask question and all questions were answered satisfactorily.   Disposition:  Pt is from Home, admitted with bilateral pleural effusion due to lung Cance, s/p chest tube inserted by IR on 8/18, which precludes a safe discharge. Discharge to home, when cleared by pulmonary and IR  Subjective: No significant events overnight, patient denies any chest pain today, mild sob.   Physical Exam: General: NAD, lying comfortably Appear in no distress, affect appropriate Eyes: PERRLA ENT: Oral Mucosa Clear, moist  Neck: no JVD,  Cardiovascular: S1 and S2 Present, no Murmur,  Respiratory: Equal air entry bilaterally, decreased breath sounds in the bilateral lower chest.  Mild crackles, no wheezes.  Left-sided chest tube intact Abdomen: Bowel Sound present, Soft and no tenderness,  Skin: no rashes Extremities: no Pedal edema, no calf tenderness Neurologic: without any new focal findings Gait not  checked due to patient safety concerns  Vitals:   10/02/23 1454 10/02/23 2118 10/03/23 0409 10/03/23 0925  BP: 133/88 116/85 125/76 110/72  Pulse: 96 (!) 110 98 94  Resp: 14 18 18 18   Temp:  97.6 F (36.4 C) 98 F (36.7 C) 97.9 F (36.6 C)  TempSrc:  Oral Oral   SpO2: 100% 98% 96% 100%  Weight:      Height:        Intake/Output Summary (Last 24 hours) at 10/03/2023 1514 Last data filed at 10/03/2023 1413 Gross per 24 hour  Intake 480 ml  Output 1050 ml  Net -570 ml   Filed Weights   10/01/23 0851  Weight: 64.4 kg    Data Reviewed: I have personally reviewed and interpreted daily labs, tele strips, imagings as discussed above. I reviewed all nursing notes, pharmacy notes, vitals, pertinent old records I have discussed plan of care as described above with RN and patient/family.  CBC: Recent Labs  Lab 09/27/23 1023 09/27/23 1203 10/01/23 0921 10/02/23 0532 10/03/23 1028  WBC 3.5* 3.6* 9.4 5.9 5.8  NEUTROABS 2.3  --   --   --   --   HGB 9.0* 9.2* 10.7* 8.7* 10.0*  HCT 29.2* 29.5* 34.9* 28.5* 33.7*  MCV 83.7 85.0 84.7 83.8 84.9  PLT 379 338 441* 390 434*   Basic Metabolic Panel: Recent Labs  Lab 09/27/23 1023 09/27/23 1203 10/01/23 0921 10/01/23 1140 10/02/23 0532 10/03/23 1028  NA 137 138 140  --  139 141  K 3.2* 3.4* 3.5  --  3.5 3.5  CL 105 106 102  --  103 106  CO2 24 25 24   --  28 27  GLUCOSE 148* 100* 110*  --  120* 109*  BUN 12 13 12   --  15 13  CREATININE 0.86 0.86 0.87  --  0.95 0.95  CALCIUM 8.7* 8.7* 9.1  --  8.4* 8.4*  MG  --   --   --  2.2 2.2 2.3  PHOS  --   --   --  3.0 3.3 3.3    Studies: DG Chest Port 1 View Result Date: 10/03/2023 CLINICAL DATA:  Pleural effusion. EXAM: PORTABLE CHEST 1 VIEW COMPARISON:  October 01, 2023. FINDINGS: Stable cardiomediastinal silhouette. Right internal jugular Port-A-Cath is unchanged. Interval placement of pigtail catheter in the left lung base. Left pleural effusion is significantly smaller. Probable ex  vacuo pneumothorax is noted laterally. Stable small right pleural effusion with associated atelectasis. Bony thorax is unremarkable. IMPRESSION: Interval placement of pigtail catheter in left lung base. Left pleural effusion is significantly smaller. Probable ex vacuo pneumothorax is noted laterally on the left. Electronically Signed   By: Lynwood Landy Raddle M.D.   On: 10/03/2023 12:01   CT [facility] PLEURAL DRAIN W/INDWELL CATH W/IMG GUIDE Result Date: 10/02/2023 INDICATION: 73 year old male with complex left pleural effusion/empyema. He presents for placement of a chest tube. EXAM: CT-guided chest tube placement TECHNIQUE: Multidetector CT imaging of the chest was performed following the standard protocol without IV contrast. RADIATION DOSE REDUCTION: This exam was performed according to the departmental dose-optimization program which includes automated exposure control, adjustment of the mA and/or kV according to patient size and/or use of iterative reconstruction technique. MEDICATIONS: The patient is currently admitted to the hospital and receiving  intravenous antibiotics. The antibiotics were administered within an appropriate time frame prior to the initiation of the procedure. ANESTHESIA/SEDATION: Moderate (conscious) sedation was employed during this procedure. A total of Versed  1 mg and Fentanyl  50 mcg was administered intravenously by the radiology nurse. Total intra-service moderate Sedation Time: 13 minutes. The patient's level of consciousness and vital signs were monitored continuously by radiology nursing throughout the procedure under my direct supervision. COMPLICATIONS: None immediate. PROCEDURE: Informed written consent was obtained from the patient after a thorough discussion of the procedural risks, benefits and alternatives. All questions were addressed. Maximal Sterile Barrier Technique was utilized including caps, mask, sterile gowns, sterile gloves, sterile drape, hand hygiene and skin  antiseptic. A timeout was performed prior to the initiation of the procedure. A planning axial CT scan was performed. The loculated left pleural effusion was identified. A suitable skin entry site was selected and marked. Local anesthesia was attained by infiltration with 1% lidocaine . A small dermatotomy was made. Under intermittent CT guidance, an 18 gauge trocar needle was advanced over a rib and into the fluid collection. A 0.035 wire was then coiled in the fluid collection. The percutaneous tract was dilated to 14 [place]. A Cook 14 [place] all-purpose drainage catheter modified with additional sideholes was advanced over the wire and formed. Aspiration yields turbid bloody fluid. Samples were sent for Gram stain and culture. The tube was then connected to low wall suction via a pleura vac atrium. The catheter was secured to the skin with 0 Prolene suture. Bandages were applied. IMPRESSION: Successful placement of 14 French chest tube into the left complex pleural effusion. Electronically Signed   By: Wilkie Lent M.D.   On: 10/02/2023 15:50    Scheduled Meds:  acetaminophen   650 mg Oral TID   vitamin C   500 mg Oral Daily   enoxaparin  (LOVENOX ) injection  40 mg Subcutaneous Q24H   feeding supplement  237 mL Oral BID BM   iron  polysaccharides  150 mg Oral Daily   megestrol   40 mg Oral Daily   multivitamin with minerals  1 tablet Oral Daily   sodium chloride  flush  3 mL Intravenous Q12H   umeclidinium-vilanterol  1 puff Inhalation Daily   Vitamin D  (Ergocalciferol )  50,000 Units Oral Q7 days   Continuous Infusions: PRN Meds: bisacodyl , ipratropium-albuterol , LORazepam , morphine  injection, ondansetron  **OR** ondansetron  (ZOFRAN ) IV, oxyCODONE , polyethylene glycol, sodium chloride  flush  Time spent: 40 minutes  Author: ELVAN SOR. MD [facility] 10/03/2023 3:14 PM  To reach On-call, see care teams to locate the attending and reach out to them via [email or web address]. If 7PM-7AM, please  contact night-coverage If you still have difficulty reaching the attending provider, please page the [facility] (Director on Call) for [facility] on amion for assistance.

## 2023-10-04 ENCOUNTER — Inpatient Hospital Stay

## 2023-10-04 ENCOUNTER — Other Ambulatory Visit

## 2023-10-04 ENCOUNTER — Ambulatory Visit

## 2023-10-04 DIAGNOSIS — J90 Pleural effusion, not elsewhere classified: Secondary | ICD-10-CM | POA: Diagnosis not present

## 2023-10-04 DIAGNOSIS — C801 Malignant (primary) neoplasm, unspecified: Secondary | ICD-10-CM | POA: Diagnosis not present

## 2023-10-04 DIAGNOSIS — J9601 Acute respiratory failure with hypoxia: Secondary | ICD-10-CM | POA: Diagnosis not present

## 2023-10-04 DIAGNOSIS — C7800 Secondary malignant neoplasm of unspecified lung: Secondary | ICD-10-CM | POA: Diagnosis not present

## 2023-10-04 DIAGNOSIS — J910 Malignant pleural effusion: Secondary | ICD-10-CM | POA: Diagnosis not present

## 2023-10-04 LAB — BASIC METABOLIC PANEL WITH GFR
Anion gap: 9 (ref 5–15)
BUN: 20 mg/dL (ref 8–23)
CO2: 28 mmol/L (ref 22–32)
Calcium: 8.6 mg/dL — ABNORMAL LOW (ref 8.9–10.3)
Chloride: 102 mmol/L (ref 98–111)
Creatinine, Ser: 0.92 mg/dL (ref 0.61–1.24)
GFR, Estimated: >60 mL/min (ref >60)
Glucose, Bld: 90 mg/dL (ref 70–99)
Potassium: 4 mmol/L (ref 3.5–5.1)
Sodium: 139 mmol/L (ref 135–145)

## 2023-10-04 LAB — CBC
HCT: 30.8 % — ABNORMAL LOW (ref 39.0–52.0)
Hemoglobin: 9.3 g/dL — ABNORMAL LOW (ref 13.0–17.0)
MCH: 25.5 pg — ABNORMAL LOW (ref 26.0–34.0)
MCHC: 30.2 g/dL (ref 30.0–36.0)
MCV: 84.6 fL (ref 80.0–100.0)
Platelets: 484 K/uL — ABNORMAL HIGH (ref 150–400)
RBC: 3.64 MIL/uL — ABNORMAL LOW (ref 4.22–5.81)
RDW: 17.7 % — ABNORMAL HIGH (ref 11.5–15.5)
WBC: 8.1 K/uL (ref 4.0–10.5)
nRBC: 0 % (ref 0.0–0.2)

## 2023-10-04 LAB — BODY FLUID CELL COUNT WITH DIFFERENTIAL
Eos, Fluid: 1 %
Lymphs, Fluid: 37 %
Monocyte-Macrophage-Serous Fluid: 1 %
Neutrophil Count, Fluid: 61 %
Total Nucleated Cell Count, Fluid: 210 uL

## 2023-10-04 LAB — GLUCOSE, PLEURAL OR PERITONEAL FLUID: Glucose, Fluid: 71 mg/dL

## 2023-10-04 LAB — LACTATE DEHYDROGENASE, PLEURAL OR PERITONEAL FLUID: LD, Fluid: 117 U/L — ABNORMAL HIGH (ref 3–23)

## 2023-10-04 LAB — PROTEIN, PLEURAL OR PERITONEAL FLUID: Total protein, fluid: <3 g/dL

## 2023-10-04 LAB — PHOSPHORUS: Phosphorus: 2.8 mg/dL (ref 2.5–4.6)

## 2023-10-04 LAB — MAGNESIUM: Magnesium: 2.3 mg/dL (ref 1.7–2.4)

## 2023-10-04 LAB — AMYLASE, PLEURAL OR PERITONEAL FLUID: Amylase, Fluid: 93 U/L

## 2023-10-04 MED ORDER — SODIUM CHLORIDE 0.9% FLUSH
20.0000 mL | Freq: Four times a day (QID) | INTRAVENOUS | Status: DC
  Administered 2023-10-04 – 2023-10-05 (×5): 20 mL via INTRAPLEURAL

## 2023-10-04 NOTE — Plan of Care (Signed)

## 2023-10-04 NOTE — Care Management Important Message (Signed)
 Important Message  Patient Details  Name: Arnoldo Hildreth MRN: [id number] Date of Birth: 1950/12/15   Important Message Given:  Yes - Medicare IM     Rojelio SHAUNNA Rattler 10/04/2023, 12:31 PM

## 2023-10-04 NOTE — Progress Notes (Signed)
 NAME:  Mitchell Frye, MRN:  [id number], DOB:  1950/12/30, LOS: 2 ADMISSION DATE:  10/01/2023, CHIEF COMPLAINT:  Pleural Effusion   History of Present Illness:   [name] is a 73 year old male with history of stage IV lung adenocarcinoma after recent VATS biopsy and decortication showed NSCLCa deposits in the visceral pleura.  He presents to the hospital with increased shortness of breath and respiratory distress. He underwent chest tube placement yesterday.  Patient's past medical history is notable for the above mentioned lung adenocarcinoma. He was initially diagnosed in September of 2021, at which point PET showed a hypermetabolic mass in the left lower lobe with assocaited mediastinal and hilar lymph adenopathy. Pathology showed adenocarcinoma. He receive chemotherapy and radiation therapy, and subsequently developed recurrent pleural effusions with cytology returning negative.  He was seen by Dr. Kerrin from thoracic surgery, and underwent VATS for drainage of pleural effusion and partial decortication and a pericardial window on 04/25/2023. He was noted to have a trapped lung that did not completely re-expand and is expected to have a recurrent pleural effusion.  He has had thoracentesis on 12/07/2022, 12/26/2022, 01/13/2023, 09/07/2023, 09/22/2023 and 09/28/2023.  Patient admitted 8/17 with increased shortness of breath and chest tightness. He was again noted to have a recurrent pleural effusion and underwent CT guided chest tube placement with IR yesterday. With drainage of the fluid, he's felt much better.   Pertinent  Medical History  -Stage IV lung adenocarcinoma -COPD  Significant Hospital Events: Including procedures, antibiotic start and stop dates in addition to other pertinent events   8/17: admission 8/18: CT guided chest tube 8/19: improved symptoms, CT chest ordered 8/20: CT chest with hydropneumothorax (trapped lung) and pleural fluid  Interim History / Subjective:   Back to baseline, reports dry mouth. No chest pain.  Objective    Blood pressure 138/81, pulse (!) 106, temperature (!) 97.4 F (36.3 C), resp. rate 19, height 5' 10 (1.778 m), weight 63 kg, SpO2 99%.        Intake/Output Summary (Last 24 hours) at 10/04/2023 1656 Last data filed at 10/04/2023 0900 Gross per 24 hour  Intake 240 ml  Output 1300 ml  Net -1060 ml   Filed Weights   10/01/23 0851 10/03/23 1605 10/04/23 0447  Weight: 64.4 kg 64.5 kg 63 kg    Examination: Physical Exam Constitutional:      Appearance: He is well-developed.  Cardiovascular:     Rate and Rhythm: Normal rate and regular rhythm.     Pulses: Normal pulses.     Heart sounds: Normal heart sounds.  Pulmonary:     Breath sounds: No wheezing.     Comments: Decreased air entry bilaterally Neurological:     General: No focal deficit present.     Mental Status: He is alert and oriented to person, place, and time. Mental status is at baseline.     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 He is now s/p chest tube placement.  Given history of lung adenoCa and recurrent pleural effusion, he underwent VATS for fluid evacuation, decortication, and pericardial window. 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 His lung unfortunately would not re-expand and  he has a trapped lung. This is evidenced by the pneumothorax ex-vacuo after chest tube placement as well as hydropneumothorax noted on his chest CT obtained today.  Given some retained fluid noted on the CT, I have sent in another sample of pleural fluid for analysis. I have also installed a three way  stop cock in line with the chest tube to facilitate flushing. Will start q6hour flushes, and if pleural fluid analysis suggests an exudate will consider pleural lytics (unlikely).  Once chest tube is discontinued, the patient might require placement of an indwelling pleural catheter (pleurX) to decrease the need for recurrent thoracentesis. I have discussed this briefly with thoracic surgery coverage (Dr. Kerrin) and will discuss with his outpatient pulmonologist Dr. Malka  -continue chest tube to suction (-20 cm H2O) -Flush chest tube every 6 hours with NS -follow up pleural fluid analysis -Pulmonary to continue to follow  [name], MD [facility] 10/04/2023 4:56 PM   Labs   CBC: Recent Labs  Lab 10/01/23 0921 10/02/23 0532 10/03/23 1028 10/04/23 0454  WBC 9.4 5.9 5.8 8.1  HGB 10.7* 8.7* 10.0* 9.3*  HCT 34.9* 28.5* 33.7* 30.8*  MCV 84.7 83.8 84.9 84.6  PLT 441* 390 434* 484*    Basic Metabolic Panel: Recent Labs  Lab 10/01/23 0921 10/01/23 1140 10/02/23 0532 10/03/23 1028 10/04/23 0454  NA 140  --  139 141 139  K 3.5  --  3.5 3.5 4.0  CL 102  --  103 106 102  CO2 24  --  28 27 28   GLUCOSE 110*  --  120* 109* 90  BUN 12  --  15 13 20   CREATININE 0.87  --  0.95 0.95 0.92  CALCIUM 9.1  --  8.4* 8.4* 8.6*  MG  --  2.2 2.2 2.3 2.3  PHOS  --  3.0 3.3 3.3 2.8   GFR: Estimated Creatinine Clearance: 62.8 mL/min (by C-G formula based on SCr of 0.92 mg/dL). Recent Labs  Lab 10/01/23 0921 10/02/23 0532 10/03/23 1028 10/04/23 0454  WBC 9.4 5.9 5.8 8.1    Liver Function Tests: No results for input(s): AST, ALT, ALKPHOS, BILITOT, PROT, ALBUMIN  in the last 168 hours.  No results for input(s): LIPASE, AMYLASE in the last 168 hours. No results for input(s): AMMONIA in the last 168 hours.  ABG No results found for: PHART, PCO2ART, PO2ART, HCO3, TCO2, ACIDBASEDEF, O2SAT   Coagulation Profile: No results  for input(s): INR, PROTIME in the last 168 hours.  Cardiac Enzymes: No results for input(s): CKTOTAL, CKMB, CKMBINDEX, TROPONINI in the last 168 hours.  HbA1C: Hgb A1c MFr Bld  Date/Time Value Ref Range Status  04/26/2023 04:25 AM 5.9 (H) 4.8 - 5.6 % Final    Comment:    (NOTE) Pre diabetes:          5.7%-6.4%  Diabetes:              >6.4%  Glycemic control for   <7.0% adults with diabetes     CBG: No results for input(s): GLUCAP in the last 168 hours.  Review of Systems:   N/A  Past Medical History:  He,  has a past medical history of Dyspnea, Hyperlipidemia, Hypertension, and Primary malignant neoplasm of left lower lobe of lung (HCC).   Surgical History:   Past Surgical History:  Procedure Laterality Date   CHEST TUBE INSERTION Right 04/25/2023   Procedure: RIGHT PLEURAL CHEST TUBE INSERTION;  Surgeon: Kerrin Elspeth BROCKS, MD;  Location:  [facility] OR;  Service: Thoracic;  Laterality: Right;   ESOPHAGOGASTRODUODENOSCOPY (EGD) WITH PROPOFOL  N/A 12/01/2020   Procedure: ESOPHAGOGASTRODUODENOSCOPY (EGD) WITH PROPOFOL ;  Surgeon: Unk Corinn Skiff, MD;  Location: [facility] ENDOSCOPY;  Service: Gastroenterology;  Laterality: N/A;   IR THORACENTESIS ASP PLEURAL SPACE W/IMG GUIDE  12/26/2022   PERICARDIAL FLUID DRAINAGE N/A 04/25/2023   Procedure: DRAINAGE, PERICARDIAL EFFUSION;  Surgeon: Kerrin Elspeth BROCKS, MD;  Location: [facility] OR;  Service: Thoracic;  Laterality: N/A;   PERICARDIAL WINDOW N/A 04/25/2023   Procedure: CREATION, PERICARDIAL WINDOW;  Surgeon: Kerrin Elspeth BROCKS, MD;  Location: [facility] OR;  Service: Thoracic;  Laterality: N/A;   PLEURAL EFFUSION DRAINAGE Bilateral 04/25/2023   Procedure: DRAINAGE, PLEURAL EFFUSION;  Surgeon: Kerrin Elspeth BROCKS, MD;  Location: [facility] OR;  Service: Thoracic;  Laterality: Bilateral;   PORTA CATH INSERTION N/A 10/22/2019   Procedure: PORTA CATH INSERTION;  Surgeon: Jama Cordella MATSU, MD;  Location: [facility] INVASIVE CV LAB;  Service:  Cardiovascular;  Laterality: N/A;   VIDEO ASSISTED THORACOSCOPY (VATS)/DECORTICATION Left 04/25/2023   Procedure: VIDEO ASSISTED THORACOSCOPY (VATS)/DECORTICATION;  Surgeon: Kerrin Elspeth BROCKS, MD;  Location: [facility] OR;  Service: Thoracic;  Laterality: Left;   VIDEO BRONCHOSCOPY WITH ENDOBRONCHIAL ULTRASOUND N/A 09/25/2019   Procedure: VIDEO BRONCHOSCOPY WITH ENDOBRONCHIAL ULTRASOUND;  Surgeon: Tamea Dedra CROME, MD;  Location: [facility] ORS;  Service: Pulmonary;  Laterality: N/A;     Social History:   reports that he quit smoking about 31 years ago. His smoking use included cigarettes. He started smoking about 51 years ago. He has a 20 pack-year smoking history. He has never used smokeless tobacco. He reports that he does not currently use alcohol. He reports that he does not use drugs.   Family History:  His family history includes Cancer in his brother.   Allergies Allergies  Allergen Reactions   Taxol  [Paclitaxel ] Other (See Comments)    Chest pain, hip pain, coughing , wheezing     Home Medications  Prior to Admission medications   Medication Sig Start Date End Date Taking? Authorizing Provider  acetaminophen  (TYLENOL ) 500 MG tablet Take 500 mg by mouth every 6 (six) hours as needed for mild pain (pain score 1-3) (takes as needed for generalized pain.  Denies pain at this time.).   Yes [provider]  albuterol  (VENTOLIN  HFA) 108 (90 Base) MCG/ACT inhaler INHALE 2 PUFFS INTO THE LUNGS EVERY 4 HOURS AS NEEDED FOR WHEEZING OR SHORTNESS OF BREATH 06/22/23  Yes Melanee Annah BROCKS, MD  lidocaine -prilocaine  (EMLA ) cream Apply to affected area once 05/17/23  Yes Melanee Annah BROCKS, MD  ondansetron  (ZOFRAN ) 8 MG tablet Take 1 tablet (8 mg total) by mouth every 8 (eight) hours as needed for nausea or vomiting. 05/17/23  Yes Melanee Annah BROCKS, MD  oxyCODONE  (OXY IR/ROXICODONE ) 5 MG immediate release tablet Take 1 tablet (5 mg total) by mouth every 6 (six) hours as needed. 09/27/23  Yes Melanee Annah BROCKS, MD   pantoprazole  (PROTONIX ) 20 MG tablet Take 1 tablet (20 mg total) by mouth every morning. 07/05/23  Yes Melanee Annah BROCKS, MD  prochlorperazine  (COMPAZINE ) 10 MG tablet Take 1 tablet (10 mg total) by mouth every 6 (six) hours as needed for nausea or vomiting. 05/17/23  Yes Melanee Annah BROCKS, MD  umeclidinium-vilanterol (ANORO ELLIPTA ) 62.5-25 MCG/ACT AEPB Inhale 1 puff into the lungs daily. 05/16/23  Yes Assaker, Darrin, MD  megestrol  (MEGACE ) 40 MG tablet Take 1 tablet (40 mg total) by mouth daily. Patient not taking: Reported on 10/01/2023 08/16/23  Melanee Annah BROCKS, MD  potassium chloride  SA (KLOR-CON  M) 20 MEQ tablet TAKE 1 TABLET(20 MEQ) BY MOUTH DAILY Patient not taking: Reported on 10/01/2023 07/11/23   Melanee Annah BROCKS, MD      I spent 35 minutes caring for this patient today, including preparing to see the patient, obtaining a medical history , reviewing a separately obtained history, performing a medically appropriate examination and/or evaluation, counseling and educating the patient/family/caregiver, ordering medications, tests, or procedures, documenting clinical information in the electronic health record, and independently interpreting results (not separately reported/billed) and communicating results to the patient/family/caregiver

## 2023-10-04 NOTE — Progress Notes (Signed)
 Progress Note   Patient: Mitchell Frye FMW:[id number] DOB: 03-Oct-1950 DOA: 10/01/2023     2 DOS: the patient was seen and examined on 10/04/2023   Brief hospital course:  Mitchell Frye is a 73 y.o. male with Past medical history of adenocarcinoma of lung and COPD, is reviewed from EMR, presented to [facility] ED with complaining of recurrent chest pain and shortness of breath.  Patient has recurrent pleural effusion, multiple thoracentesis were done, recent thoracentesis was done on 8/14, 500 mL fluid was tapped from right side.  Patient returned back to ED with same complaint of shortness of breath and chest pain.  In the ED patient was noticed to have dyspnea on exertion and hypoxia so admission was requested.  Case was also discussed with pulmonary.   ED Course: Afebrile, HR 112, RR 24, BP 131/89, 96% at rest on room air, O2 sat drop on exertion to 88% BMP glucose 110, rest within normal range CBC Hb 10.7, anemia and thrombocytosis platelet count 441   CTA chest: 1. No evidence of pulmonary embolus. 2. Stable right middle lobe pulmonary nodules, metastatic disease cannot be excluded. Continued attention on follow-up recommended. 3. Loculated right pleural effusion, decreased since prior study.  Minimal right pleural gas consistent with recent thoracentesis 09/28/2023. 4. Continued large multiloculated left pleural effusion, with resolution of the left pleural gas seen previously. 5. Stable left perihilar consolidation with air bronchograms and bronchiectasis. This may reflect post therapeutic change.   Procedures   8/18 Successful placement of 14 French chest tube into the left complex pleural effusion.    Assessment and Plan:  Recurrent Malignant Left pleural effusion CTA showed no PE, loculated left middle lobe pleural effusion --Pulmonology following & managing  --Pigtail chest tube was placed by IR on 8/18 --Fluid culture, NGTD - follow --Follow IR and pulmonary for further  recommendation and disposition plan  Recurrent pleural effusion due to underlying lung cancer S/p multiple thoracentesis, recent thoracentesis was done on 8/14 CT chest as above, shows bilateral pleural effusion --Pulmonology managing - see recommendations   Dyspnea on exertion and hypoxia - due to recurrent pleural effusion. --Supplemental O2 inhalation  --Symptomatic treatment --Qualified for home oxygen with ambulation TOC consulted     COPD - stable, not acutely exacerbated. 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  He is frequently moaning, reports pain from chest tube.  He asks for pain medication and to be knocked out for CT scan ordered this AM.  States he is afraid of all scanner machines and needs to be put to sleep to do it.     Physical Exam: Vitals:   10/03/23 2049 10/04/23 0430 10/04/23 0447 10/04/23 0805  BP: 136/86 (!) 133/93  138/81  Pulse: (!) 103 (!) 104  (!) 106  Resp: 17 20  19   Temp: (!) 97.5 F (36.4 C) 97.9 F (36.6 C)  (!) 97.4 F (36.3 C)  TempSrc:      SpO2: 100% 100%  99%  Weight:   63 kg   Height:       General exam: awake, alert, no acute distress, chronically ill appearing HEENT: moist mucus membranes, hearing grossly normal  Respiratory system: CTAB with left side diminished more than right, no expiratory wheezes, tachypneic with  shallow inspiratory volumes, exam limited by pt talking and moaning Cardiovascular system: normal S1/S2, RRR, no pedal edema.   Gastrointestinal system: soft, NT, ND, no HSM felt, +bowel sounds. Central nervous system: A&O x3. no gross focal neurologic deficits, normal speech Extremities: moves all, no edema, normal tone Skin: dry, intact, normal  temperature Psychiatry: anxious mood, congruent affect   Data Reviewed:  Notable labs --   Normal BMP except Ca 8.6 Hbg 10.0 > >9.3 Platelets 484  CT chest w/o contrast today --  --Moderate to large left hydropneumothorax with left pleural drainage catheter within frothy material along posterior lung base, empyema not excluded given complexity. --Other findings see report  Family Communication: None present, will attempt to call as time allows  Disposition: Status is: Inpatient Remains inpatient appropriate because: chest tube remains in place, ongoing evaluation   Planned Discharge Destination: Home with Home Health    Time spent: 45 minutes  Author: Burnard DELENA Cunning, DO 10/04/2023 3:36 PM  For on call review [email or web address].

## 2023-10-05 ENCOUNTER — Inpatient Hospital Stay

## 2023-10-05 DIAGNOSIS — J90 Pleural effusion, not elsewhere classified: Secondary | ICD-10-CM | POA: Diagnosis not present

## 2023-10-05 DIAGNOSIS — C801 Malignant (primary) neoplasm, unspecified: Secondary | ICD-10-CM | POA: Diagnosis not present

## 2023-10-05 DIAGNOSIS — C7802 Secondary malignant neoplasm of left lung: Secondary | ICD-10-CM | POA: Diagnosis not present

## 2023-10-05 DIAGNOSIS — J910 Malignant pleural effusion: Secondary | ICD-10-CM | POA: Diagnosis not present

## 2023-10-05 LAB — CBC
HCT: 30 % — ABNORMAL LOW (ref 39.0–52.0)
Hemoglobin: 8.9 g/dL — ABNORMAL LOW (ref 13.0–17.0)
MCH: 25.1 pg — ABNORMAL LOW (ref 26.0–34.0)
MCHC: 29.7 g/dL — ABNORMAL LOW (ref 30.0–36.0)
MCV: 84.5 fL (ref 80.0–100.0)
Platelets: 502 K/uL — ABNORMAL HIGH (ref 150–400)
RBC: 3.55 MIL/uL — ABNORMAL LOW (ref 4.22–5.81)
RDW: 17.9 % — ABNORMAL HIGH (ref 11.5–15.5)
WBC: 7.8 K/uL (ref 4.0–10.5)
nRBC: 0 % (ref 0.0–0.2)

## 2023-10-05 LAB — PROCALCITONIN: Procalcitonin: 0.13 ng/mL

## 2023-10-05 LAB — TRIGLYCERIDES, BODY FLUIDS: Triglycerides, Fluid: 10 mg/dL

## 2023-10-05 LAB — PATHOLOGIST SMEAR REVIEW

## 2023-10-05 NOTE — Progress Notes (Addendum)
 Progress Note   Patient: Mitchell Frye FMW:[id number] DOB: [birth year] DOA: 10/01/2023     3 DOS: the patient was seen and examined on 10/05/2023   Brief hospital course:  Mitchell Frye is a 73 y.o. male with Past medical history of adenocarcinoma of lung and COPD, is reviewed from EMR, presented to [facility] ED with complaining of recurrent chest pain and shortness of breath.  Patient has recurrent pleural effusion, multiple thoracentesis were done, recent thoracentesis was done on 8/14, 500 mL fluid was tapped from right side.  Patient returned back to ED with same complaint of shortness of breath and chest pain.  In the ED patient was noticed to have dyspnea on exertion and hypoxia so admission was requested.  Case was also discussed with pulmonary.   ED Course: Afebrile, HR 112, RR 24, BP 131/89, 96% at rest on room air, O2 sat drop on exertion to 88% BMP glucose 110, rest within normal range CBC Hb 10.7, anemia and thrombocytosis platelet count 441   CTA chest: 1. No evidence of pulmonary embolus. 2. Stable right middle lobe pulmonary nodules, metastatic disease cannot be excluded. Continued attention on follow-up recommended. 3. Loculated right pleural effusion, decreased since prior study.  Minimal right pleural gas consistent with recent thoracentesis 09/28/2023. 4. Continued large multiloculated left pleural effusion, with resolution of the left pleural gas seen previously. 5. Stable left perihilar consolidation with air bronchograms and bronchiectasis. This may reflect post therapeutic change.    Procedures   8/18 Successful placement of 14 French chest tube into the left complex pleural effusion.    Assessment and Plan:  Recurrent Malignant Left pleural effusion CTA showed no PE, loculated left middle lobe pleural effusion --Pulmonology following & managing  --Pigtail chest tube was placed by IR on 8/18 -- pulmonology plans for removal today --Fluid culture, NGTD -  follow --Follow IR and pulmonary for further recommendation and disposition plan  Recurrent pleural effusion due to underlying lung cancer S/p multiple thoracentesis, recent thoracentesis was done on 8/14 CT chest as above, shows bilateral pleural effusion --Pulmonology managing - see recommendations --Plan to remove chest tube today --Will monitor overnight --Eventually likely to require indwelling PleurX for long term management, but will need to be done when fluid has re-accumulated.   --Follow up outpatient with CT surgery   Dyspnea on exertion and hypoxia - due to recurrent pleural effusion. --Supplemental O2 inhalation  --Symptomatic treatment --Qualified for home oxygen with ambulation TOC consulted     COPD - stable, not acutely exacerbated. 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 With recurrent malignant pleural effusion --Follow up with oncologist outpatient  Vitamin D  deficiency -- started on supplementation 50k units every 7 days      Subjective: Pt having chest tube dressing changed when seen today.  He reports feeling better today, having less pain.  Shortness of breath has improved.  He is happy to hear of plan to remove the chest tube.  Hopes to go home tomorrow.   Physical Exam: Vitals:   10/04/23 2136 10/05/23 0406 10/05/23 0500 10/05/23 0915  BP: 125/83 126/83  125/82  Pulse: 100 (!) 101  100  Resp: (!) 22 20  20   Temp: 98.3 F (36.8 C) 98.2 F (36.8 C)  98 F (36.7 C)  TempSrc: Oral Oral  Oral  SpO2: 100% 100%  (S) 98%  Weight:   63.1 kg  Height:       General exam: awake, alert, no acute distress, chronically ill appearing HEENT: moist mucus membranes, hearing grossly normal  Respiratory system: CTAB with left side diminished more than right, no expiratory wheezes, shallow inspiratory volumes but less tachypneic  today Cardiovascular system: normal S1/S2, RRR, no pedal edema.   Gastrointestinal system: soft, NT, ND, no HSM felt, +bowel sounds. Central nervous system: A&O x3. no gross focal neurologic deficits, normal speech Extremities: moves all, no edema, normal tone Skin: dry, intact, normal temperature Psychiatry: anxious mood, congruent affect   Data Reviewed:  Notable labs --   Hbg 8.7 >> 10.0 > >9.3 >> 8.9 Platelets 484 >> 502  Pleural fluid 8/20 -- LDH 117 elevated, total nucleated cells 210, 37 lymphs, 61 neut's, fluid orange and cloudy appearing   CT chest w/o contrast 8/20 --  --Moderate to large left hydropneumothorax with left pleural drainage catheter within frothy material along posterior lung base, empyema not excluded given complexity. --Other findings see report   Family Communication: None present, will attempt to call as time allows   Disposition: Status is: Inpatient Remains inpatient appropriate because: chest tube remains in place    Planned Discharge Destination: Home with Home Health    Time spent: 42 minutes  Author: Burnard DELENA Cunning, DO 10/05/2023 12:11 PM  For on call review [email or web address].

## 2023-10-05 NOTE — Plan of Care (Signed)

## 2023-10-05 NOTE — Progress Notes (Signed)
 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  He presents to the hospital with increased shortness of breath and respiratory distress. He underwent chest tube placement yesterday.  Patient's past medical history is notable for the above mentioned lung adenocarcinoma. He was initially diagnosed in September of 2021, at which point PET showed a hypermetabolic mass in the left lower lobe with assocaited mediastinal and hilar lymph adenopathy. Pathology showed adenocarcinoma. He receive chemotherapy and radiation therapy, and subsequently developed recurrent pleural effusions with cytology returning negative.  He was seen by Dr. Kerrin from thoracic surgery, and underwent VATS for drainage of pleural effusion and partial decortication and a pericardial window on 04/25/2023. He was noted to have a trapped lung that did not completely re-expand and is expected to have a recurrent pleural effusion.  He has had thoracentesis on 12/07/2022, 12/26/2022, 01/13/2023, 09/07/2023, 09/22/2023 and 09/28/2023.  Patient admitted 8/17 with increased shortness of breath and chest tightness. He was again noted to have a recurrent pleural effusion and underwent CT guided chest tube placement with IR yesterday. With drainage of the fluid, he's felt much better.   Pertinent  Medical History  -Stage IV lung adenocarcinoma -COPD  Significant Hospital Events: Including procedures, antibiotic start and stop dates in addition to other pertinent events   8/17: admission 8/18: CT guided chest tube 8/19: improved symptoms, CT chest ordered 8/20: CT chest with hydropneumothorax (trapped lung) and pleural fluid 8/21: chest tube output decreased,  120 mL yesterday  Interim History / Subjective:  Baseline shortness of breath. Denies pain.  Objective    Blood pressure 126/83, pulse (!) 101, temperature 98.2 F (36.8 C), temperature source Oral, resp. rate 20, height 5' 10 (1.778 m), weight 63.1 kg, SpO2 100%.        Intake/Output Summary (Last 24 hours) at 10/05/2023 0820 Last data filed at 10/05/2023 0500 Gross per 24 hour  Intake 240 ml  Output 220 ml  Net 20 ml   Filed Weights   10/03/23 1605 10/04/23 0447 10/05/23 0500  Weight: 64.5 kg 63 kg 63.1 kg    Examination: Physical Exam Constitutional:      Appearance: He is well-developed.  Cardiovascular:     Rate and Rhythm: Normal rate and regular rhythm.     Pulses: Normal pulses.     Heart sounds: Normal heart sounds.  Pulmonary:     Breath sounds: No wheezing.     Comments: Decreased air entry bilaterally Neurological:     General: No focal deficit present.     Mental Status: He is alert and oriented to person, place, and time. Mental status is at baseline.     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 He is now s/p chest tube placement.  Given history of lung adenoCa and recurrent pleural effusion, he underwent VATS for fluid evacuation, decortication, and pericardial window. 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  His lung unfortunately would not re-expand and he has a trapped lung. This is evidenced by the pneumothorax ex-vacuo after chest tube placement as well as hydropneumothorax noted on his chest CT obtained today.  Given some retained fluid noted on the CT, I have sent in another sample of  pleural fluid for analysis and it appears transudative. I have also installed a three way stop cock in line with the chest tube to facilitate flushing. Will start q6hour flushes but will hold off on intrapleural lytics as this effusion is chronic and will continue to re-accumulate due to his trapped lung.  Once chest tube is discontinued, the patient might require placement of an indwelling pleural catheter (pleurX) to decrease the need for recurrent thoracentesis. I have discussed this briefly with thoracic surgery coverage (Dr. Kerrin) and will discuss with his outpatient pulmonologist Dr. Malka  -continue chest tube to suction (-20 cm H2O) -Flush chest tube every 6 hours with NS -will consider chest tube discontinuation today. -Pulmonary to continue to follow  [name], MD [facility] 10/05/2023 10:52 AM   Labs   CBC: Recent Labs  Lab 10/01/23 0921 10/02/23 0532 10/03/23 1028 10/04/23 0454 10/05/23 0425  WBC 9.4 5.9 5.8 8.1 7.8  HGB 10.7* 8.7* 10.0* 9.3* 8.9*  HCT 34.9* 28.5* 33.7* 30.8* 30.0*  MCV 84.7 83.8 84.9 84.6 84.5  PLT 441* 390 434* 484* 502*    Basic Metabolic Panel: Recent Labs  Lab 10/01/23 0921 10/01/23 1140 10/02/23 0532 10/03/23 1028 10/04/23 0454  NA 140  --  139 141 139  K 3.5  --  3.5 3.5 4.0  CL 102  --  103 106 102  CO2 24  --  28 27 28   GLUCOSE 110*  --  120* 109* 90  BUN 12  --  15 13 20   CREATININE 0.87  --  0.95 0.95 0.92  CALCIUM 9.1  --  8.4* 8.4* 8.6*  MG  --  2.2 2.2 2.3 2.3  PHOS  --  3.0 3.3 3.3 2.8   GFR: Estimated Creatinine Clearance: 62.9 mL/min (by C-G formula based on SCr of 0.92 mg/dL). Recent Labs  Lab 10/02/23 0532 10/03/23 1028 10/04/23 0454 10/05/23 0425  PROCALCITON  --   --   --  0.13  WBC 5.9 5.8 8.1 7.8    Liver Function Tests: No results for input(s): AST, ALT, ALKPHOS, BILITOT, PROT, ALBUMIN  in the last 168 hours.  No results for input(s): LIPASE, AMYLASE in  the last 168 hours. No results for input(s): AMMONIA in the last 168 hours.  ABG No results found for: PHART, PCO2ART, PO2ART, HCO3, TCO2, ACIDBASEDEF, O2SAT   Coagulation Profile: No results for input(s): INR, PROTIME in the last 168 hours.  Cardiac Enzymes: No results for input(s): CKTOTAL, CKMB, CKMBINDEX, TROPONINI in the last 168 hours.  HbA1C: Hgb A1c MFr Bld  Date/Time Value Ref Range Status  04/26/2023 04:25 AM 5.9 (H) 4.8 - 5.6 % Final    Comment:    (NOTE) Pre diabetes:          5.7%-6.4%  Diabetes:              >6.4%  Glycemic control for   <7.0% adults with diabetes     CBG: No results for input(s): GLUCAP in the last 168 hours.  Review of Systems:   N/A  Past Medical History:  He,  has a past medical history of Dyspnea, Hyperlipidemia, Hypertension, and Primary malignant neoplasm of left lower  lobe of lung (HCC).   Surgical History:   Past Surgical History:  Procedure Laterality Date   CHEST TUBE INSERTION Right 04/25/2023   Procedure: RIGHT PLEURAL CHEST TUBE INSERTION;  Surgeon: Kerrin Elspeth BROCKS, MD;  Location: [facility] OR;  Service: Thoracic;  Laterality: Right;   ESOPHAGOGASTRODUODENOSCOPY (EGD) WITH PROPOFOL  N/A 12/01/2020   Procedure: ESOPHAGOGASTRODUODENOSCOPY (EGD) WITH PROPOFOL ;  Surgeon: Unk Corinn Skiff, MD;  Location: [facility] ENDOSCOPY;  Service: Gastroenterology;  Laterality: N/A;   IR THORACENTESIS ASP PLEURAL SPACE W/IMG GUIDE  12/26/2022   PERICARDIAL FLUID DRAINAGE N/A 04/25/2023   Procedure: DRAINAGE, PERICARDIAL EFFUSION;  Surgeon: Kerrin Elspeth BROCKS, MD;  Location: [facility] OR;  Service: Thoracic;  Laterality: N/A;   PERICARDIAL WINDOW N/A 04/25/2023   Procedure: CREATION, PERICARDIAL WINDOW;  Surgeon: Kerrin Elspeth BROCKS, MD;  Location: [facility] OR;  Service: Thoracic;  Laterality: N/A;   PLEURAL EFFUSION DRAINAGE Bilateral 04/25/2023   Procedure: DRAINAGE, PLEURAL EFFUSION;  Surgeon: Kerrin Elspeth BROCKS, MD;   Location: [facility] OR;  Service: Thoracic;  Laterality: Bilateral;   PORTA CATH INSERTION N/A 10/22/2019   Procedure: PORTA CATH INSERTION;  Surgeon: Jama Cordella MATSU, MD;  Location: [facility] INVASIVE CV LAB;  Service: Cardiovascular;  Laterality: N/A;   VIDEO ASSISTED THORACOSCOPY (VATS)/DECORTICATION Left 04/25/2023   Procedure: VIDEO ASSISTED THORACOSCOPY (VATS)/DECORTICATION;  Surgeon: Kerrin Elspeth BROCKS, MD;  Location: [facility] OR;  Service: Thoracic;  Laterality: Left;   VIDEO BRONCHOSCOPY WITH ENDOBRONCHIAL ULTRASOUND N/A 09/25/2019   Procedure: VIDEO BRONCHOSCOPY WITH ENDOBRONCHIAL ULTRASOUND;  Surgeon: Tamea Dedra CROME, MD;  Location: [facility] ORS;  Service: Pulmonary;  Laterality: N/A;     Social History:   reports that he quit smoking about 31 years ago. His smoking use included cigarettes. He started smoking about 51 years ago. He has a 20 pack-year smoking history. He has never used smokeless tobacco. He reports that he does not currently use alcohol. He reports that he does not use drugs.   Family History:  His family history includes Cancer in his brother.   Allergies Allergies  Allergen Reactions   Taxol  [Paclitaxel ] Other (See Comments)    Chest pain, hip pain, coughing , wheezing     Home Medications  Prior to Admission medications   Medication Sig Start Date End Date Taking? Authorizing Provider  acetaminophen  (TYLENOL ) 500 MG tablet Take 500 mg by mouth every 6 (six) hours as needed for mild pain (pain score 1-3) (takes as needed for generalized pain.  Denies pain at this time.).   Yes [provider]  albuterol  (VENTOLIN  HFA) 108 (90 Base) MCG/ACT inhaler INHALE 2 PUFFS INTO THE LUNGS EVERY 4 HOURS AS NEEDED FOR WHEEZING OR SHORTNESS OF BREATH 06/22/23  Yes Melanee Annah BROCKS, MD  lidocaine -prilocaine  (EMLA ) cream Apply to affected area once 05/17/23  Yes Melanee Annah BROCKS, MD  ondansetron  (ZOFRAN ) 8 MG tablet Take 1 tablet (8 mg total) by mouth every 8 (eight) hours as needed for  nausea or vomiting. 05/17/23  Yes Melanee Annah BROCKS, MD  oxyCODONE  (OXY IR/ROXICODONE ) 5 MG immediate release tablet Take 1 tablet (5 mg total) by mouth every 6 (six) hours as needed. 09/27/23  Yes Melanee Annah BROCKS, MD  pantoprazole  (PROTONIX ) 20 MG tablet Take 1 tablet (20 mg total) by mouth every morning. 07/05/23  Yes Melanee Annah BROCKS, MD  prochlorperazine  (COMPAZINE ) 10 MG tablet Take 1 tablet (10 mg total) by mouth every 6 (six) hours as needed for nausea or vomiting. 05/17/23  Yes Melanee Annah BROCKS, MD  umeclidinium-vilanterol (ANORO ELLIPTA )  62.5-25 MCG/ACT AEPB Inhale 1 puff into the lungs daily. 05/16/23  Yes Assaker, Darrin, MD  megestrol  (MEGACE ) 40 MG tablet Take 1 tablet (40 mg total) by mouth daily. Patient not taking: Reported on 10/01/2023 08/16/23   Melanee Annah BROCKS, MD  potassium chloride  SA (KLOR-CON  M) 20 MEQ tablet TAKE 1 TABLET(20 MEQ) BY MOUTH DAILY Patient not taking: Reported on 10/01/2023 07/11/23   Melanee Annah BROCKS, MD     I spent 35 minutes caring for this patient today, including preparing to see the patient, obtaining a medical history , reviewing a separately obtained history, performing a medically appropriate examination and/or evaluation, counseling and educating the patient/family/caregiver, ordering medications, tests, or procedures, documenting clinical information in the electronic health record, and independently interpreting results (not separately reported/billed) and communicating results to the patient/family/caregiver

## 2023-10-05 NOTE — Procedures (Signed)
 Chest Tube Removal:  Pigtail catheter sutures (skin, and pigtail tension catheter) were cut and chest tube site cleaned with chloroprep. Chest tube was removed while the patient was instructed to hum. Tube removed in one piece. Wound site was immediately occluded with Xeroform and sterile gauze, pressure held and then taped down with tegaderm. Patient tolerated removal well with no immediate complications.  [name], MD [facility] 10/05/2023 3:54 PM

## 2023-10-06 ENCOUNTER — Emergency Department
Admission: EM | Admit: 2023-10-06 | Discharge: 2023-10-07 | Disposition: A | Discharge status: Home / Self Care | Attending: Emergency Medicine | Admitting: Emergency Medicine

## 2023-10-06 ENCOUNTER — Inpatient Hospital Stay

## 2023-10-06 ENCOUNTER — Other Ambulatory Visit: Payer: Self-pay

## 2023-10-06 ENCOUNTER — Emergency Department

## 2023-10-06 ENCOUNTER — Encounter: Payer: Self-pay | Admitting: Emergency Medicine

## 2023-10-06 DIAGNOSIS — F989 Unspecified behavioral and emotional disorders with onset usually occurring in childhood and adolescence: Secondary | ICD-10-CM | POA: Insufficient documentation

## 2023-10-06 DIAGNOSIS — R4689 Other symptoms and signs involving appearance and behavior: Secondary | ICD-10-CM

## 2023-10-06 DIAGNOSIS — F419 Anxiety disorder, unspecified: Secondary | ICD-10-CM | POA: Diagnosis not present

## 2023-10-06 DIAGNOSIS — J449 Chronic obstructive pulmonary disease, unspecified: Secondary | ICD-10-CM | POA: Insufficient documentation

## 2023-10-06 DIAGNOSIS — R0789 Other chest pain: Secondary | ICD-10-CM | POA: Diagnosis not present

## 2023-10-06 DIAGNOSIS — R0602 Shortness of breath: Secondary | ICD-10-CM | POA: Diagnosis not present

## 2023-10-06 DIAGNOSIS — Z85118 Personal history of other malignant neoplasm of bronchus and lung: Secondary | ICD-10-CM | POA: Diagnosis not present

## 2023-10-06 DIAGNOSIS — R079 Chest pain, unspecified: Secondary | ICD-10-CM | POA: Diagnosis present

## 2023-10-06 DIAGNOSIS — J90 Pleural effusion, not elsewhere classified: Secondary | ICD-10-CM | POA: Diagnosis not present

## 2023-10-06 LAB — CBC WITH DIFFERENTIAL/PLATELET
Abs Immature Granulocytes: 0.05 K/uL (ref 0.00–0.07)
Basophils Absolute: 0 K/uL (ref 0.0–0.1)
Basophils Relative: 0 %
Eosinophils Absolute: 0 K/uL (ref 0.0–0.5)
Eosinophils Relative: 0 %
HCT: 30 % — ABNORMAL LOW (ref 39.0–52.0)
Hemoglobin: 9.2 g/dL — ABNORMAL LOW (ref 13.0–17.0)
Immature Granulocytes: 0 %
Lymphocytes Relative: 3 %
Lymphs Abs: 0.3 K/uL — ABNORMAL LOW (ref 0.7–4.0)
MCH: 25.4 pg — ABNORMAL LOW (ref 26.0–34.0)
MCHC: 30.7 g/dL (ref 30.0–36.0)
MCV: 82.9 fL (ref 80.0–100.0)
Monocytes Absolute: 0.9 K/uL (ref 0.1–1.0)
Monocytes Relative: 7 %
Neutro Abs: 10.8 K/uL — ABNORMAL HIGH (ref 1.7–7.7)
Neutrophils Relative %: 90 %
Platelets: 536 K/uL — ABNORMAL HIGH (ref 150–400)
RBC: 3.62 MIL/uL — ABNORMAL LOW (ref 4.22–5.81)
RDW: 17.9 % — ABNORMAL HIGH (ref 11.5–15.5)
WBC: 12.1 K/uL — ABNORMAL HIGH (ref 4.0–10.5)
nRBC: 0 % (ref 0.0–0.2)

## 2023-10-06 LAB — CBC
HCT: 32.1 % — ABNORMAL LOW (ref 39.0–52.0)
Hemoglobin: 9.8 g/dL — ABNORMAL LOW (ref 13.0–17.0)
MCH: 25.4 pg — ABNORMAL LOW (ref 26.0–34.0)
MCHC: 30.5 g/dL (ref 30.0–36.0)
MCV: 83.2 fL (ref 80.0–100.0)
Platelets: 573 K/uL — ABNORMAL HIGH (ref 150–400)
RBC: 3.86 MIL/uL — ABNORMAL LOW (ref 4.22–5.81)
RDW: 18.1 % — ABNORMAL HIGH (ref 11.5–15.5)
WBC: 9.2 K/uL (ref 4.0–10.5)
nRBC: 0 % (ref 0.0–0.2)

## 2023-10-06 LAB — BASIC METABOLIC PANEL WITH GFR
Anion gap: 10 (ref 5–15)
BUN: 25 mg/dL — ABNORMAL HIGH (ref 8–23)
CO2: 28 mmol/L (ref 22–32)
Calcium: 8.9 mg/dL (ref 8.9–10.3)
Chloride: 98 mmol/L (ref 98–111)
Creatinine, Ser: 1.02 mg/dL (ref 0.61–1.24)
GFR, Estimated: >60 mL/min (ref >60)
Glucose, Bld: 115 mg/dL — ABNORMAL HIGH (ref 70–99)
Potassium: 4.6 mmol/L (ref 3.5–5.1)
Sodium: 136 mmol/L (ref 135–145)

## 2023-10-06 LAB — TROPONIN I (HIGH SENSITIVITY): Troponin I (High Sensitivity): 14 ng/L (ref <18)

## 2023-10-06 MED ORDER — LORAZEPAM 2 MG/ML IJ SOLN
1.0000 mg | Freq: Once | INTRAMUSCULAR | Status: AC
  Administered 2023-10-06: 1 mg via INTRAVENOUS
  Filled 2023-10-06: qty 1

## 2023-10-06 MED ORDER — OXYCODONE HCL 5 MG PO TABS
5.0000 mg | ORAL_TABLET | Freq: Once | ORAL | Status: AC
  Administered 2023-10-06: 5 mg via ORAL
  Filled 2023-10-06: qty 1

## 2023-10-06 MED ORDER — VITAMIN D (ERGOCALCIFEROL) 1.25 MG (50000 UNIT) PO CAPS: 50000.0000 [IU] | ORAL_CAPSULE | ORAL | 0 refills | Status: DC

## 2023-10-06 MED ORDER — ADULT MULTIVITAMIN W/MINERALS CH: 1.0000 | ORAL_TABLET | Freq: Every day | ORAL | Status: DC

## 2023-10-06 MED ORDER — ENSURE PLUS HIGH PROTEIN PO LIQD: 237.0000 mL | Freq: Three times a day (TID) | ORAL | Status: DC

## 2023-10-06 MED ORDER — ASCORBIC ACID 500 MG PO TABS: 500.0000 mg | ORAL_TABLET | Freq: Every day | ORAL | Status: DC

## 2023-10-06 MED ORDER — POLYSACCHARIDE IRON COMPLEX 150 MG PO CAPS: 150.0000 mg | ORAL_CAPSULE | Freq: Every day | ORAL | 0 refills | Status: DC

## 2023-10-06 NOTE — ED Triage Notes (Signed)
 Arrived by EMS due to family calling because patient was out in the front yard digitally removing feces due to constipation. Patient stated the walls were closing in and he needed some air.  Pt stated the air was so dry and hot in the house and he couldn't do it in there. He said he got it taken care of before EMS got there.  Stomach hurts a little bit.  EMS states pt was just released from IP due to pleural effusion.  EMS noted yellow drainage on outer corner of bandage.  Pt is able to answer all questions.

## 2023-10-06 NOTE — Progress Notes (Signed)
 SATURATION QUALIFICATIONS: (This note is used to comply with regulatory documentation for home oxygen)  Patient Saturations on Room Air at Rest = 93%  Patient Saturations on Room Air while Ambulating = 87%-88%  Patient Saturations on 2 Liters of oxygen while Ambulating = 95%  Please briefly explain why patient needs home oxygen:

## 2023-10-06 NOTE — TOC Initial Note (Deleted)
 Transition of Care [facility]) - Initial/Assessment Note    Patient Details  Name: Mitchell Frye MRN: [id number] Date of Birth: [birth year]  Transition of Care [facility]) CM/SW Contact:    Mitchell CHRISTELLA Jaksch, RN Phone Number: 10/06/2023, 11:10 AM  Clinical Narrative:                  Admitted for: Pleural Effusion Admitted from: Home  Current home health/prior home health/DME: Per patient he has a RW at home.   Spoke with patient at bedside. He states he lives with his sister Mitchell Frye and that she is supportive in his care. She typically bring him to appointments and will provide DC transport. He states he has a RW at home.        Patient Goals and CMS Choice            Expected Discharge Plan and Services         Expected Discharge Date: 10/06/23                                    Prior Living Arrangements/Services                       Activities of Daily Living   ADL Screening (condition at time of admission) Independently performs ADLs?: Yes (appropriate for developmental age) Is the patient deaf or have difficulty hearing?: No Does the patient have difficulty seeing, even when wearing glasses/contacts?: No Does the patient have difficulty concentrating, remembering, or making decisions?: No  Permission Sought/Granted                  Emotional Assessment              Admission diagnosis:  Exertional dyspnea [R06.09] Bilateral pleural effusion [J90] Recurrent pleural effusion [J90] Patient Active Problem List   Diagnosis Date Noted   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) 10/03/2023   Protein-calorie malnutrition, severe 10/03/2023   Recurrent pleural effusion 10/01/2023   Pericardial effusion 04/28/2023   Malnutrition of moderate degree 04/28/2023   Hypokalemia 04/23/2023   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 01/10/2023   Reactive thrombocytosis 12/02/2020   Palliative care encounter    Chest pain 11/30/2020    Neoplasm related pain    Chronic dyspnea 11/09/2020   Radiation esophagitis 12/02/2019   B12 deficiency 11/04/2019   Goals of care, counseling/discussion 10/04/2019   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) 10/04/2019   PCP:  [facility] Pharmacy:   [id number] [address], [place] - [address] AT [facility] OF [address] [place] [address] Phone: [phone] Fax: [phone]  [facility] - [place], [place] - [address] [place] [address] Phone: [phone] Fax: [phone]     Social Drivers of Health (SDOH) Social History: SDOH Screenings   Food Insecurity: No Food Insecurity (10/01/2023)  Housing: Low Risk  (10/01/2023)  Transportation Needs: No Transportation Needs (10/01/2023)  Utilities: Not At Risk (10/01/2023)  Depression (PHQ2-9): Low Risk  (09/27/2023)  Social Connections: Socially Isolated (10/01/2023)  Tobacco Use: Medium Risk (10/01/2023)   SDOH Interventions:     Readmission Risk Interventions     No data to display

## 2023-10-06 NOTE — Plan of Care (Signed)

## 2023-10-06 NOTE — TOC Transition Note (Signed)
 Transition of Care [facility]) - Discharge Note   Patient Details  Name: Mitchell Frye MRN: [id number] Date of Birth: [birth year]  Transition of Care [facility]) CM/SW Contact:  Asberry CHRISTELLA Jaksch, RN Phone Number: 10/06/2023, 11:45 AM   Clinical Narrative:     Patient will DC to: Home w/ DME Anticipated DC date: 10/06/2023 Family notified: Sister [name] Transport by: Darin [name]   Per MD patient ready for DC home w/ DME. RN, patient, and patient's family, notified of DC. Referral for home O2 sent to [employer]. A portable tank is to be delivered to room prior to DC. Patient's sister [name] to provide DC transport.  TOC signing off.   Final next level of care: Home/Self Care Barriers to Discharge: Barriers Resolved   Patient Goals and CMS Choice Patient states their goals for this hospitalization and ongoing recovery are:: Going home          Discharge Placement                       Discharge Plan and Services Additional resources added to the After Visit Summary for                  DME Arranged: Oxygen DME Agency: [employer] Date DME Agency Contacted: 10/06/23 Time DME Agency Contacted: 1116 Representative spoke with at DME Agency: Thomasina HH Arranged: NA          Social Drivers of Health (SDOH) Interventions SDOH Screenings   Food Insecurity: No Food Insecurity (10/01/2023)  Housing: Low Risk  (10/01/2023)  Transportation Needs: No Transportation Needs (10/01/2023)  Utilities: Not At Risk (10/01/2023)  Depression (PHQ2-9): Low Risk  (09/27/2023)  Social Connections: Socially Isolated (10/01/2023)  Tobacco Use: Medium Risk (10/01/2023)     Readmission Risk Interventions     No data to display

## 2023-10-06 NOTE — TOC Initial Note (Signed)
 Transition of Care [facility]) - Initial/Assessment Note    Patient Details  Name: Mitchell Frye MRN: [id number] Date of Birth: 07-24-1950  Transition of Care [facility]) CM/SW Contact:    Asberry CHRISTELLA Jaksch, RN Phone Number: 10/06/2023, 11:18 AM  Clinical Narrative:                 Admitted for: Pleural Effusion Admitted from: Home Current home health/prior home health/DME: Per patient, he owns a RW  Spoke with patient at bedside. He states he lives with his sister [name] and that she is supportive with his care. She drives him to medical appointments and helps with prescriptions. Sister [name] will provide transport at DC.   Expected Discharge Plan: Home/Self Care Barriers to Discharge: Continued Medical Work up   Patient Goals and CMS Choice Patient states their goals for this hospitalization and ongoing recovery are:: Going home          Expected Discharge Plan and Services         Expected Discharge Date: 10/06/23               DME Arranged: Oxygen DME Agency: [employer] Date DME Agency Contacted: 10/06/23 Time DME Agency Contacted: 1116 Representative spoke with at DME Agency: Mitch HH Arranged: NA          Prior Living Arrangements/Services   Lives with:: Siblings (Sister [name]) Patient language and need for interpreter reviewed:: Yes Do you feel safe going back to the place where you live?: Yes          Current home services: DME (Per patient he has a RW at home)    Activities of Daily Living   ADL Screening (condition at time of admission) Independently performs ADLs?: Yes (appropriate for developmental age) Is the patient deaf or have difficulty hearing?: No Does the patient have difficulty seeing, even when wearing glasses/contacts?: No Does the patient have difficulty concentrating, remembering, or making decisions?: No  Permission Sought/Granted                  Emotional Assessment Appearance:: Appears stated age     Orientation: : Oriented  to Self, Oriented to Place, Oriented to  Time, Oriented to Situation      Admission diagnosis:  Exertional dyspnea [R06.09] Bilateral pleural effusion [J90] Recurrent pleural effusion [J90] Patient Active Problem List   Diagnosis Date Noted   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) 10/03/2023   Protein-calorie malnutrition, severe 10/03/2023   Recurrent pleural effusion 10/01/2023   Pericardial effusion 04/28/2023   Malnutrition of moderate degree 04/28/2023   Hypokalemia 04/23/2023   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 01/10/2023   Reactive thrombocytosis 12/02/2020   Palliative care encounter    Chest pain 11/30/2020   Neoplasm related pain    Chronic dyspnea 11/09/2020   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 [place] [address] Phone: [phone] Fax: [phone]  [facility] - [place], [place] - [address] [place] [address] Phone: [phone] Fax: [phone]     Social Drivers of Health (SDOH) Social History: SDOH Screenings   Food Insecurity: No Food Insecurity (10/01/2023)  Housing: Low  Risk  (10/01/2023)  Transportation Needs: No Transportation Needs (10/01/2023)  Utilities: Not At Risk (10/01/2023)  Depression (PHQ2-9): Low Risk  (09/27/2023)  Social Connections: Socially Isolated (10/01/2023)  Tobacco Use: Medium Risk (10/01/2023)   SDOH Interventions:     Readmission Risk Interventions     No data to display

## 2023-10-06 NOTE — ED Provider Notes (Signed)
 [facility] Provider Note    Event Date/Time   First MD Initiated Contact with Patient 10/06/23 2220     (approximate)   History   No chief complaint on file.   HPI  Mitchell Frye is a 73 y.o. male with a history of adenocarcinoma of the lung, COPD, recurrent pleural effusion who presents with a behavioral concern.  Per EMS, the family called out because the patient was in the front yard of his home and disimpacting himself due to constipation.  The patient states that he was trying to disimpact himself in the bathroom, but felt like it was very hot in the air and the walls were closing in on him, so he went outside to do it and to get fresh air.  He states that he was successful.  He no longer feels any pressure in his rectum.  However, while in the ED he started having chest pain across the front of his chest associated with shortness of breath.  He states he feels anxious and like he wants to go home.  He denies any cough or fever.  I reviewed the past medical records.  The patient was admitted to the hospital service from 8/17 until early this morning with chest pain and shortness of breath.  He had chest tube placement for a recurrent pleural effusion.  Subsequent the tube was removed yesterday.   Physical Exam   Triage Vital Signs: ED Triage Vitals  Encounter Vitals Group     BP 10/06/23 2128 127/74     Girls Systolic BP Percentile --      Girls Diastolic BP Percentile --      Boys Systolic BP Percentile --      Boys Diastolic BP Percentile --      Pulse Rate 10/06/23 2128 (!) 115     Resp 10/06/23 2128 20     Temp 10/06/23 2128 98.4 F (36.9 C)     Temp Source 10/06/23 2128 Oral     SpO2 10/06/23 2128 100 %     Weight 10/06/23 2125 147 lb 8 oz (66.9 kg)     Height 10/06/23 2125 5' 10 (1.778 m)     Head Circumference --      Peak Flow --      Pain Score 10/06/23 2129 4     Pain Loc --      Pain Education --      Exclude from Growth Chart --      Most recent vital signs: Vitals:   10/06/23 2128  BP: 127/74  Pulse: (!) 115  Resp: 20  Temp: 98.4 F (36.9 C)  SpO2: 100%     General: Awake, no distress.  CV:  Good peripheral perfusion.  Resp:  Normal effort.  Lungs CTAB. Abd:  No distention.  Other:  No peripheral edema.  Left lateral chest wall chest tube site clean, dry, intact.   ED Results / Procedures / Treatments   Labs (all labs ordered are listed, but only abnormal results are displayed) Labs Reviewed  CBC WITH DIFFERENTIAL/PLATELET - Abnormal; Notable for the following components:      Result Value   WBC 12.1 (*)    RBC 3.62 (*)    Hemoglobin 9.2 (*)    HCT 30.0 (*)    MCH 25.4 (*)    RDW 17.9 (*)    Platelets 536 (*)    Neutro Abs 10.8 (*)    Lymphs Abs 0.3 (*)  All other components within normal limits  BASIC METABOLIC PANEL WITH GFR  TROPONIN I (HIGH SENSITIVITY)     EKG  ED ECG REPORT I, Waylon Cassis, the attending physician, personally viewed and interpreted this ECG.  Date: 10/06/2023 EKG Time: 2247 Rate: 111 Rhythm: Sinus tachycardia nons QRS Axis: normal Intervals: normal ST/T Wave abnormalities: Nonspecific ST abnormality Narrative Interpretation: no evidence of acute ischemia    RADIOLOGY  Chest x-ray: I independently viewed and interpreted the images; there is no focal consolidation new from prior imaging.  Radiology report indicates the following:  IMPRESSION:  1. No significant change in the loculated hydropneumothorax in the  lateral left mid to lower chest, with underlying consolidation  change left mid perihilar area and left base.  2. Small layering right pleural effusion with overlying patchy  atelectasis or airspace disease right base.    PROCEDURES:  Critical Care performed: No  Procedures   MEDICATIONS ORDERED IN ED: Medications  LORazepam  (ATIVAN ) injection 1 mg (1 mg Intravenous Given 10/06/23 2310)     IMPRESSION / MDM / ASSESSMENT AND  PLAN / ED COURSE  I reviewed the triage vital signs and the nursing notes.  73 year old male with PMH as noted above presents after family called out to the patient tried to disimpact himself in the front yard of his home due to constipation.  After arriving in the ED, the patient was initially asymptomatic although then suddenly developed severe chest pain and appeared extremely anxious on exam, trying to climb out of the bed.  He was tachycardic with otherwise normal vital signs.  Ativan  was given and he is now calm with improved pain.  Neuroexam is nonfocal.  The patient is answering questions appropriately and does not appear altered.  Differential diagnosis includes, but is not limited to, acute anxiety/panic, musculoskeletal chest pain, recurrent pleural effusion, acute bronchitis, bronchospasm.  We will obtain chest x-ray, basic labs, cardiac enzymes, and reassess.  There is no indication for mental health evaluation.  The patient is behaving appropriately.  I did attempt to contact the family to get more information, although no one picked up the phone when I tried to call the emergency contact.  Patient's presentation is most consistent with acute presentation with potential threat to life or bodily function.  The patient is on the cardiac monitor to evaluate for evidence of arrhythmia and/or significant heart rate changes.  ----------------------------------------- 11:20 PM on 10/06/2023 -----------------------------------------  CBC shows mild leukocytosis and chronic anemia.  BMP and troponin are pending.  Chest x-ray is essentially unchanged from prior.  The patient remains calm and comfortable after Ativan .  I have signed him out to the oncoming ED physician Dr. Cyrena.     FINAL CLINICAL IMPRESSION(S) / ED DIAGNOSES   Final diagnoses:  Atypical chest pain  Acute anxiety  Behavior concern     Rx / DC Orders   ED Discharge Orders     None        Note:  This document  was prepared using Dragon voice recognition software and may include unintentional dictation errors.    Cassis Waylon, MD 10/06/23 2320

## 2023-10-06 NOTE — Discharge Summary (Signed)
 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 *  Hospital Course:  Kale Rondeau is a 73 y.o. male with Past medical history of adenocarcinoma of lung and COPD, is reviewed from EMR, presented to [facility] ED with complaining of recurrent chest pain and shortness of breath.  Patient has recurrent pleural effusion, multiple thoracentesis were done, recent thoracentesis was done on 8/14, 500 mL fluid was tapped from right side.  Patient returned back to ED with same complaint of shortness of breath and chest pain.  In the ED patient was noticed to have dyspnea on exertion and hypoxia so admission was requested.  Case was also discussed with pulmonary.   ED Course: Afebrile, HR 112, RR 24, BP 131/89, 96% at rest on room air, O2 sat drop on exertion to 88% BMP glucose 110, rest within normal range CBC Hb 10.7, anemia and thrombocytosis platelet count 441   CTA chest: 1. No evidence of pulmonary embolus. 2. Stable right middle lobe pulmonary nodules, metastatic disease cannot be excluded. Continued attention on follow-up recommended. 3. Loculated right pleural effusion, decreased since prior study.  Minimal right pleural gas consistent with recent thoracentesis 09/28/2023. 4. Continued large multiloculated left pleural effusion, with resolution of the left  pleural gas seen previously. 5. Stable left perihilar consolidation with air bronchograms and bronchiectasis. This may reflect post therapeutic change.     Procedures   8/18 Successful placement of 14 French chest tube into the left complex pleural effusion.  Chest tube removed 8/21   8/22 -- pt doing well when seen on rounds today.  He is agreeable for discharge, stating he is ambulating well and dyspnea improved.  Just before d/c pt was more short of breath briefly. Repeat CXR was stable compared to yesterday without re-accumulation of fluid.  Pt is medically stable for d/c with close pulmonology follow up arranged by their office.  Pt agreeable and requesting d/c home pending his ride to get him.    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   --Follow up outpatient with CT surgery    Dyspnea on exertion  Acute respiratory failure with hypoxia - due to recurrent pleural effusion. --Qualified for O2 with ambulation - ordered for d/c --Supplemental O2 inhalation  --Mgmt of pleural effusion as outlined --Follow up with Pulmonology - scheduled     COPD - stable, not acutely exacerbated. 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 With recurrent malignant pleural effusion --Follow up with oncologist outpatient   Vitamin D  deficiency -- started on supplementation 50k units every 7 days         Consultants: Pulmonology, IR Procedures  performed: as above  Disposition: Home Diet recommendation:  Regular diet DISCHARGE MEDICATION: Allergies as of 10/06/2023       Reactions   Taxol  [paclitaxel ] Other (See Comments)   Chest pain, hip pain, coughing , wheezing        Medication List     STOP taking these medications    lidocaine -prilocaine  cream Commonly known as: EMLA    potassium chloride  SA 20 MEQ tablet Commonly known as: KLOR-CON  M       TAKE these medications    acetaminophen  500 MG tablet Commonly known as: TYLENOL  Take 500 mg by mouth every 6 (six) hours as needed for mild pain (pain score 1-3) (takes as needed for generalized pain.  Denies pain at this time.).   ascorbic acid  500 MG tablet Commonly known as: VITAMIN C  Take 1 tablet (500 mg total) by mouth daily.   feeding supplement Liqd Take 237 mLs by mouth 3 (three) times daily between meals.   iron  polysaccharides 150 MG capsule Commonly known as: NIFEREX Take 1 capsule (150 mg total) by mouth daily.   megestrol  40 MG tablet Commonly known as: MEGACE  Take 1 tablet (40 mg total) by mouth daily.   multivitamin with minerals Tabs tablet Take 1 tablet by mouth daily.   ondansetron  8 MG tablet Commonly known as: Zofran  Take 1 tablet (8 mg total) by mouth every 8 (eight) hours as needed for nausea or vomiting.   oxyCODONE  5 MG immediate release tablet Commonly known as: Oxy IR/ROXICODONE  Take 1 tablet (5 mg total) by mouth every 6 (six) hours as needed.   pantoprazole  20 MG tablet Commonly known as: Protonix  Take 1 tablet (20 mg total) by mouth every morning.   prochlorperazine  10 MG tablet Commonly known as: COMPAZINE  Take 1 tablet (10 mg total) by mouth every 6 (six) hours as needed for nausea or vomiting.   Vitamin D  (Ergocalciferol ) 1.25 MG (50000 UNIT) Caps capsule Commonly known as: DRISDOL  Take 1 capsule (50,000 Units total) by mouth every 7 (seven) days.        Follow-up Information     Assaker, Darrin, MD.  Call.   Specialty: Pulmonary Disease Why: Call to confirm follow up appointment date & time. Contact information: [address] [place] [address] [phone]         Center, [facility]. Schedule an appointment as soon as possible for a visit.   Why: Hospital follow up in 1-2 weeks Contact information: [address] [place] [address] [phone]                Discharge Exam: Filed Weights   10/04/23 0447 10/05/23 0500 10/06/23 0500  Weight: 63 kg 63.1 kg 65.6 kg   General exam: awake, alert, no acute distress, crhonically ill appearing HEENT: temporal wasting, moist mucus membranes, hearing grossly normal  Respiratory system: CTAB generally diminished more so at left base, no wheezes, normal respiratory effort at rest on room air. Cardiovascular system: normal S1/S2, RRR, no pedal edema.   Gastrointestinal system: soft, NT, ND Central nervous system: A&O x 3. no gross focal neurologic deficits, normal speech Extremities: moves all, no edema, normal tone Skin: dry, intact, normal temperature Psychiatry: anxious mood, congruent affect, judgement and insight appear normal'  Condition at  discharge: stable  The results of significant diagnostics from this hospitalization (including imaging, microbiology, ancillary and laboratory) are listed below for reference.   Imaging Studies: DG Abdomen 1 View Result Date: 10/16/2023 EXAM: 1 VIEW XRAY OF THE ABDOMEN 10/16/2023 02:10:00 PM COMPARISON: None available. CLINICAL HISTORY: Abdominal pain with constipation. FINDINGS: BOWEL: Mild retained gas and stool throughout the large bowel. No disproportionately dilated small bowel loops. No evidence of pneumatosis or pneumoperitoneum. SOFT TISSUES: No opaque urinary calculi. BONES: No acute osseous abnormality. PLEURAL SPACES: Pleural effusions at bilateral lung bases. IMPRESSION: 1. Nonobstructive bowel gas pattern. 2. Pleural effusions at  bilateral lung bases. Electronically signed by: Selinda Blue MD 10/16/2023 02:47 PM EDT RP Workstation: [id number]   CT CHEST ABDOMEN PELVIS W CONTRAST Result Date: 10/13/2023 CLINICAL DATA:  Left lower lobe lung cancer restaging. Shortness of breath. * Tracking Code: BO * EXAM: CT CHEST, ABDOMEN, AND PELVIS WITH CONTRAST TECHNIQUE: Multidetector CT imaging of the chest, abdomen and pelvis was performed following the standard protocol during bolus administration of intravenous contrast. RADIATION DOSE REDUCTION: This exam was performed according to the departmental dose-optimization program which includes automated exposure control, adjustment of the mA and/or kV according to patient size and/or use of iterative reconstruction technique. CONTRAST:  80mL OMNIPAQUE  IOHEXOL  300 MG/ML  SOLN COMPARISON:  08/09/2023 and CT chest 10/04/2023 FINDINGS: Despite efforts by the technologist and patient, motion artifact is present on today's exam and could not be eliminated. This reduces exam sensitivity and specificity. CT CHEST FINDINGS Cardiovascular: Right Port-A-Cath tip: Cavoatrial junction. Atheromatous vascular calcification of the aortic arch. Mediastinum/Nodes: Unremarkable Lungs/Pleura: Similar loculated left hydropneumothorax compared to 08/09/2023. Mildly reduced right pleural effusion compared to 08/09/2023 although probably with loculated components laterally. Emphysema. Passive atelectasis bilaterally. Stable 6 mm perifissural nodule in the right middle lobe along the minor fissure, image 87 series 3. The peribronchovascular nodule medially in the right lower lobe is obscured by motion artifact on image 101 of series 3 but appears grossly stable from 08/09/2023. Bandlike airspace opacity in the right lower lobe for example on image 86 series 3 appears stable from 10/04/2023. Musculoskeletal: Unremarkable CT ABDOMEN PELVIS FINDINGS Hepatobiliary: Unremarkable Pancreas: 1.3 cm cystic lesion of the pancreatic tail,  previously 1.1 cm on 12/17/2019 and previously 1.3 cm on 08/09/2023. Possibilities include postinflammatory cystic lesion or intraductal papillary mucinous neoplasm. Spleen: Unremarkable Adrenals/Urinary Tract: Stable bilateral renal cysts. Adrenal glands unremarkable. Urinary bladder unremarkable. Stomach/Bowel: Prominent stool throughout the colon favors constipation. Vascular/Lymphatic: Atherosclerosis is present, including aortoiliac atherosclerotic disease. Reproductive: Retracted right testicle. Other: Small amount of free pelvic fluid. Musculoskeletal: Unremarkable IMPRESSION: 1. Similar loculated left hydropneumothorax compared to 08/09/2023. 2. Mildly reduced right pleural effusion compared to 08/09/2023 although probably with loculated components laterally. 3. Stable 6 mm perifissural nodule in the right middle lobe along the minor fissure. The peribronchovascular nodule medially in the right lower lobe is obscured by motion artifact but appears grossly stable from 08/09/2023. 4. Bandlike airspace opacity in the right lower lobe appears stable from 10/04/2023. 5. 1.3 cm cystic lesion of the pancreatic tail, previously 1.1 cm on 12/17/2019. Possibilities include postinflammatory cystic lesion or intraductal papillary mucinous neoplasm. This can be surveilled in the context of the patient's anticipated follow up imaging. 6. Prominent stool throughout the colon favors constipation. 7. Small amount of free pelvic fluid. 8.  Aortic Atherosclerosis (ICD10-I70.0). Electronically Signed   By: Ryan Salvage M.D.   On: 10/13/2023 14:38   DG Chest Port 1 View Result Date: 10/06/2023 CLINICAL DATA:  [id number]. Chest pain. Follow-up left hydropneumothorax para EXAM: PORTABLE CHEST 1 VIEW COMPARISON:  Portable chest earlier today at 4:32 p.m. FINDINGS: 10:40 p.m. Loculated hydropneumothorax again noted in the lateral left mid to lower chest, with underlying consolidation change left mid perihilar area and left base. Small  layering right pleural effusion with overlying patchy atelectasis or airspace disease right base. The more cephalad lungs are clear. Overall aeration is unchanged. Cardiac size is normal. Right IJ port catheter again terminates in the upper right atrium. The mediastinum is stable. Slight dextroscoliosis with no new osseous findings. Aortic atherosclerosis. IMPRESSION: 1. No significant change in the loculated hydropneumothorax in the lateral left mid to lower chest, with underlying consolidation change left mid perihilar area and left base. 2. Small layering right pleural effusion with overlying patchy atelectasis or airspace disease right base. Electronically Signed   By: Francis Quam M.D.   On: 10/06/2023 22:57   DG Chest Port 1 View Result Date: 10/06/2023 CLINICAL DATA:  [id number] Dyspnea [id number] EXAM: PORTABLE CHEST - 1 VIEW COMPARISON:  October 06, 2023 7:06 a.m.; October 05, 2023, October 04, 2023 FINDINGS: Unchanged small right pleural effusion, likely loculated. Similar appearance of the loculated pleural gas on the left. Patchy opacities in perihilar left lung, likely atelectasis. Layering effusion is also likely present on the left. No cardiomegaly. Right chest port terminates at the cavoatrial junction. Multilevel thoracic osteophytosis. IMPRESSION: 1. Similar amount of loculated pleural gas on the left with perihilar atelectasis in the left lung. 2. Small loculated right pleural effusion with right basilar atelectasis, also unchanged. Electronically Signed   By: Rogelia Myers M.D.   On: 10/06/2023 17:09   DG Chest Port 1 View Result Date: 10/06/2023 CLINICAL DATA:  Left chest tube withdrawal EXAM: PORTABLE CHEST 1 VIEW COMPARISON:  10/05/2023 FINDINGS: Pigtail thoracostomy tube on the left as been removed. The amount of pleural air lateral to the left mid lung and lower lung does not appear increased. Associated volume loss in the left perihilar region and lower lobe is stable. Right pleural effusion  and lower lung volume loss is stable. Port-A-Cath again seen. No new or worsening finding otherwise. IMPRESSION: Removal of the left thoracostomy tube. No increase in the amount of pleural air on the left. Stable volume loss in the left perihilar region and lower lobe. Stable right pleural effusion and lower lung volume loss. Electronically Signed   By: Oneil Officer M.D.   On: 10/06/2023 10:27   DG Chest Port 1 View Result Date: 10/05/2023 EXAM: 1 VIEW XRAY OF THE CHEST 10/05/2023 08:40:56 AM COMPARISON: AP radiograph of the chest dated 10/03/2023. CLINICAL HISTORY: Pleural effusion. FINDINGS: LUNGS AND PLEURA: Bilateral pleural effusions are present. Irregularly reticular opacities are also present within the mid and lower lung zones. There is likely loculated pneumothorax present laterally within the left chest cavity. HEART AND MEDIASTINUM: No acute abnormality of the cardiac and mediastinal silhouettes. BONES AND SOFT TISSUES: No acute osseous abnormality. A pigtail drainage catheter is noted at the left lung base. A right internal jugular chest port is also again demonstrated. IMPRESSION: 1. Bilateral pleural effusions. 2. Likely loculated pneumothorax in the left chest cavity. 3. Irregular reticular opacities in the mid and lower lung zones. 4. Pigtail drainage catheter at the left lung base and right internal jugular chest port. Electronically signed by: Evalene Coho MD 10/05/2023 08:45 AM EDT RP Workstation: [id number]   CT CHEST WO CONTRAST Result Date: 10/04/2023 CLINICAL DATA:  Pleural effusion, history of lung cancer *  Tracking Code: BO * EXAM: CT CHEST WITHOUT CONTRAST TECHNIQUE: Multidetector CT imaging of the chest was performed following the standard protocol without IV contrast. RADIATION DOSE REDUCTION: This exam was performed according to the departmental dose-optimization program which includes automated exposure control, adjustment of the mA and/or kV according to patient size and/or  use of iterative reconstruction technique. COMPARISON:  10/01/2023 FINDINGS: Cardiovascular: Right Port-A-Cath tip: Cavoatrial junction. Mild aortic and coronary atherosclerotic vascular calcification. Mild cardiomegaly. Mediastinum/Nodes: Low-grade background stranding in the mediastinal adipose tissue potentially from third spacing of fluids. Trace pericardial effusion. Gas-filled upper thoracic esophagus. Lungs/Pleura: Centrilobular emphysema. Moderate to large left hydropneumothorax with a left pleural drainage catheter within frothy material along the posterior lung base, cannot exclude empyema given the complexity. There is some passive atelectasis in the left upper lobe and left lower lobe. Stable 6 mm right middle lobe subpleural nodule along the minor fissure. Mildly increased peripheral airspace opacity laterally in the right lower lobe for example on image 99 series 4. Peripheral atelectasis in the right lower lobe is partially passive due to a small to moderate right pleural effusion. Left perihilar consolidation is again observed. Upper Abdomen: Unremarkable Musculoskeletal: Unremarkable IMPRESSION: 1. Moderate to large left hydropneumothorax with a left pleural drainage catheter within frothy material along the posterior lung base, cannot exclude empyema given the complexity. 2. Small to moderate right pleural effusion with passive atelectasis in the right lower lobe. 3. Mildly increased peripheral airspace opacity laterally in the right lower lobe, potentially from pneumonia. 4. Stable 6 mm right middle lobe subpleural nodule along the minor fissure. 5. Mild cardiomegaly. 6. Low-grade background stranding in the mediastinal adipose tissue potentially from third spacing of fluids. 7. Stable left perihilar consolidation. 8. Aortic Atherosclerosis (ICD10-I70.0) and Emphysema (ICD10-J43.9). Electronically Signed   By: Ryan Salvage M.D.   On: 10/04/2023 13:50   DG Chest Port 1 View Result Date:  10/03/2023 CLINICAL DATA:  Pleural effusion. EXAM: PORTABLE CHEST 1 VIEW COMPARISON:  October 01, 2023. FINDINGS: Stable cardiomediastinal silhouette. Right internal jugular Port-A-Cath is unchanged. Interval placement of pigtail catheter in the left lung base. Left pleural effusion is significantly smaller. Probable ex vacuo pneumothorax is noted laterally. Stable small right pleural effusion with associated atelectasis. Bony thorax is unremarkable. IMPRESSION: Interval placement of pigtail catheter in left lung base. Left pleural effusion is significantly smaller. Probable ex vacuo pneumothorax is noted laterally on the left. Electronically Signed   By: Lynwood Landy Raddle M.D.   On: 10/03/2023 12:01   CT [facility] PLEURAL DRAIN W/INDWELL CATH W/IMG GUIDE Result Date: 10/02/2023 INDICATION: 73 year old male with complex left pleural effusion/empyema. He presents for placement of a chest tube. EXAM: CT-guided chest tube placement TECHNIQUE: Multidetector CT imaging of the chest was performed following the standard protocol without IV contrast. RADIATION DOSE REDUCTION: This exam was performed according to the departmental dose-optimization program which includes automated exposure control, adjustment of the mA and/or kV according to patient size and/or use of iterative reconstruction technique. MEDICATIONS: The patient is currently admitted to the hospital and receiving intravenous antibiotics. The antibiotics were administered within an appropriate time frame prior to the initiation of the procedure. ANESTHESIA/SEDATION: Moderate (conscious) sedation was employed during this procedure. A total of Versed  1 mg and Fentanyl  50 mcg was administered intravenously by the radiology nurse. Total intra-service moderate Sedation Time: 13 minutes. The patient's level of consciousness and vital signs were monitored continuously by radiology nursing throughout the procedure under my direct supervision. COMPLICATIONS: None immediate.  PROCEDURE: Informed written consent was obtained from the patient after a thorough discussion of the procedural risks, benefits and alternatives. All questions were addressed. Maximal Sterile Barrier Technique was utilized including caps, mask, sterile gowns, sterile gloves, sterile drape, hand hygiene and skin antiseptic. A timeout was performed prior to the initiation of the procedure. A planning axial CT scan was performed. The loculated left pleural effusion was identified. A suitable skin entry site was selected and marked. Local anesthesia was attained by infiltration with 1% lidocaine . A small dermatotomy was made. Under intermittent CT guidance, an 18 gauge trocar needle was advanced over a rib and into the fluid collection. A 0.035 wire was then coiled in the fluid collection. The percutaneous tract was dilated to 14 [place]. A Cook 14 [place] all-purpose drainage catheter modified with additional sideholes was advanced over the wire and formed. Aspiration yields turbid bloody fluid. Samples were sent for Gram stain and culture. The tube was then connected to low wall suction via a pleura vac atrium. The catheter was secured to the skin with 0 Prolene suture. Bandages were applied. IMPRESSION: Successful placement of 14 French chest tube into the left complex pleural effusion. Electronically Signed   By: Wilkie Lent M.D.   On: 10/02/2023 15:50   CT Angio Chest Pulmonary Embolism (PE) W or WO Contrast Result Date: 10/01/2023 CLINICAL DATA:  Worsening chest pain and shortness of breath for months, history of lung cancer EXAM: CT ANGIOGRAPHY CHEST WITH CONTRAST TECHNIQUE: Multidetector CT imaging of the chest was performed using the standard protocol during bolus administration of intravenous contrast. Multiplanar CT image reconstructions and MIPs were obtained to evaluate the vascular anatomy. RADIATION DOSE REDUCTION: This exam was performed according to the departmental dose-optimization program  which includes automated exposure control, adjustment of the mA and/or kV according to patient size and/or use of iterative reconstruction technique. CONTRAST:  75mL OMNIPAQUE  IOHEXOL  350 MG/ML SOLN COMPARISON:  08/09/2023, 10/01/2023 FINDINGS: Cardiovascular: This is a technically adequate evaluation of the pulmonary vasculature. No filling defects or pulmonary emboli. The heart is unremarkable without pericardial effusion. No evidence of thoracic aortic aneurysm or dissection. Atherosclerosis of the aorta and coronary vasculature. Mediastinum/Nodes: No enlarged mediastinal, hilar, or axillary lymph nodes. Thyroid  gland, trachea, and esophagus demonstrate no significant findings. Lungs/Pleura: There is a small partially loculated right pleural effusion, decreased since prior study after interval thoracentesis. Small amount of gas within the pleural space consistent with thoracentesis 09/28/2023. Persistent large multiloculated left pleural effusion, with resolution of the pleural gas seen previously. Stable dense consolidation in the left perihilar region with air bronchograms and bronchiectasis again noted. Dependent consolidation within the right lower lobe again identified, compatible with atelectasis. 6 mm subpleural right middle lobe pulmonary nodule image 68/5 not appreciably changed since prior study. 7 x 8 mm right middle lobe pulmonary nodule image 83/5 is also unchanged. No new nodules or masses. Stable emphysema.  Central airways are patent. Upper Abdomen: No acute abnormality. Musculoskeletal: No acute or destructive bony abnormalities. Reconstructed images demonstrate no additional findings. Review of the MIP images confirms the above findings. IMPRESSION: 1. No evidence of pulmonary embolus. 2. Stable right middle lobe pulmonary nodules, metastatic disease cannot be excluded. Continued attention on follow-up recommended. 3. Loculated right pleural effusion, decreased since prior study. Minimal right  pleural gas consistent with recent thoracentesis 09/28/2023. 4. Continued large multiloculated left pleural effusion, with resolution of the left pleural gas seen previously. 5. Stable left perihilar consolidation with air bronchograms and bronchiectasis. This may  reflect post therapeutic change. 6. Aortic Atherosclerosis (ICD10-I70.0) and Emphysema (ICD10-J43.9). Electronically Signed   By: Ozell Daring M.D.   On: 10/01/2023 12:58   DG Chest 2 View Result Date: 10/01/2023 CLINICAL DATA:  Chest pain. EXAM: DG CHEST 2V COMPARISON:  09/28/2023 FINDINGS: Bilateral pleural effusions again noted with probable loculated component on the left. Left hilar opacity is similar to prior. Right Port-A-Cath remains in place. Cardiopericardial silhouette is at upper limits of normal for size. No acute bony abnormality. Telemetry leads overlie the chest. IMPRESSION: 1. No substantial change. Bilateral pleural effusions with probable loculated component on the left. 2. Left hilar opacity is similar to prior. Electronically Signed   By: Camellia Candle M.D.   On: 10/01/2023 09:26   DG Chest Port 1 View Result Date: 09/28/2023 CLINICAL DATA:  s/p right thoracentesis EXAM: PORTABLE CHEST 1 VIEW COMPARISON:  IR thoracentesis, earlier same day. Chest XR, 09/27/2023. CT chest, 09/08/2023. FINDINGS: Support lines; RIGHT chest port, catheter tip well-positioned at the superior cavoatrial junction Obscured LEFT cardiac border. Patchy RIGHT lung nodular opacities, the RIGHT chest is otherwise relatively clear. Improved aeration of the RIGHT chest post thoracentesis without residual pleural effusion. No pneumothorax. Similar degree of LEFT hilar prominence, basilar mass and pleural thickening. No interval osseous abnormality. IMPRESSION: 1. Improved aeration of the RIGHT chest post thoracentesis without residual pleural effusion. No pneumothorax 2. Similar prominence of the LEFT hilum, basilar mass and pleural thickening. Electronically  Signed   By: Thom Hall M.D.   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 Request for therapeutic thoracentesis. EXAM: ULTRASOUND GUIDED RIGHT THORACENTESIS MEDICATIONS: 1% lidocaine , 8 mL. COMPLICATIONS: None immediate. PROCEDURE: An ultrasound guided thoracentesis was thoroughly discussed with the patient and questions answered. The benefits, risks, alternatives and complications were also discussed. The patient understands and wishes to proceed with the procedure. Written consent was obtained. Ultrasound was performed to localize and mark an adequate pocket of fluid in the RIGHT chest. The area was then prepped and draped in the normal sterile fashion. 1% Lidocaine  was used for local anesthesia. Under ultrasound guidance a 6 Fr Safe-T-Centesis catheter was introduced. Thoracentesis was performed. The catheter was removed and a dressing applied. FINDINGS: A total of approximately 500 mL of bloody fluid was removed. IMPRESSION: Successful ultrasound guided RIGHT thoracentesis yielding 500 mL of pleural fluid. Procedure performed by: Sherrilee Bal, PA-C under the supervision of Thom Hall, MD Electronically Signed   By: Thom Hall M.D.   On: 09/28/2023 11:53   DG Chest 2 View Result Date: 09/27/2023 CLINICAL DATA:  Shortness of breath, chest tightness. EXAM: CHEST - 2 VIEW COMPARISON:  September 23, 2023. FINDINGS: Stable cardiomediastinal silhouette. Right internal jugular Port-A-Cath is unchanged. Stable loculated pleural effusions are noted bilaterally, left greater than right. Associated bibasilar scarring or atelectasis is noted. Bony thorax is unremarkable. IMPRESSION: Stable loculated bilateral pleural effusions with associated bibasilar scarring or atelectasis. Electronically Signed   By: Lynwood Landy Raddle M.D.   On:  09/27/2023 13:26   DG Chest Portable 1 View Result Date: 09/23/2023 CLINICAL DATA:  sob EXAM: PORTABLE CHEST - 1 VIEW COMPARISON:  September 22, 2023 FINDINGS: Unchanged small right and moderate volume left pleural effusion, likely loculated, with left mid and lower lung zone atelectasis. No new airspace consolidation or pneumothorax. No cardiomegaly. Right chest port in place  terminating at the cavoatrial junction. Aortic atherosclerosis. No acute fracture or destructive lesion. IMPRESSION: No interval significant change to the small right and moderate volume left pleural effusions. Electronically Signed   By: Rogelia Myers M.D.   On: 09/23/2023 11:34   US  THORACENTESIS ASP PLEURAL SPACE W/IMG GUIDE Result Date: 09/22/2023 INDICATION: 74 year old male with a history of left lower lobe lung cancer with recurrent pleural effusions who presented to the ED with worsening shortness of breath. Request for therapeutic thoracentesis. EXAM: ULTRASOUND GUIDED RIGHT THORACENTESIS MEDICATIONS: 1% lidocaine , 7 mL. COMPLICATIONS: None immediate. PROCEDURE: An ultrasound guided thoracentesis was thoroughly discussed with the patient and questions answered. The benefits, risks, alternatives and complications were also discussed. The patient understands and wishes to proceed with the procedure. Written consent was obtained. Ultrasound was performed to localize and mark an adequate pocket of fluid in the right chest. The area was then prepped and draped in the normal sterile fashion. 1% Lidocaine  was used for local anesthesia. Under ultrasound guidance a 6 Fr Safe-T-Centesis catheter was introduced. Thoracentesis was performed. The catheter was removed and a dressing applied. FINDINGS: A total of approximately 500 mL of bloody fluid was removed. IMPRESSION: Successful ultrasound guided right thoracentesis yielding 500 mL of pleural fluid. Procedure performed by: Sherrilee Bal, PA-C under the supervision of Dr. DELENA Balder  Electronically Signed   By: Juliene Balder M.D.   On: 09/22/2023 16:41   DG Chest Port 1 View Result Date: 09/22/2023 CLINICAL DATA:  Status post right thoracentesis. EXAM: PORTABLE CHEST 1 VIEW COMPARISON:  Earlier today. FINDINGS: Small to moderate-sized right pleural effusion with a decrease in amount. No pneumothorax. Stable partially loculated moderate-sized left pleural effusion and perihilar density. Stable right jugular porta catheter. Unremarkable bones. IMPRESSION: 1. Small to moderate-sized right pleural effusion with a decrease in amount. No pneumothorax. 2. Stable partially loculated moderate-sized left pleural effusion and perihilar density. Electronically Signed   By: Elspeth Bathe M.D.   On: 09/22/2023 15:47   DG Chest 2 View Result Date: 09/22/2023 CLINICAL DATA:  Shortness of breath. EXAM: CHEST - 2 VIEW COMPARISON:  09/07/2023 FINDINGS: Right IJ Port-A-Cath unchanged. Moderate size left effusion unchanged. Stable left perihilar opacification. Interval worsening moderate size right pleural effusion. Likely associated right basilar atelectasis. Cardiomediastinal silhouette and remainder of the exam is unchanged. IMPRESSION: 1. Interval worsening moderate size right pleural effusion with likely associated right basilar atelectasis. 2. Stable moderate size left effusion. Stable left perihilar opacification. Electronically Signed   By: Toribio Agreste M.D.   On: 09/22/2023 11:53    Microbiology: Results for orders placed or performed during the hospital encounter of 10/01/23  Aerobic/Anaerobic Culture w Gram Stain (surgical/deep wound)     Status: None   Collection Time: 10/02/23  3:21 PM   Specimen: Wound; Pleural Fluid  Result Value Ref Range Status   Specimen Description   Final    WOUND Performed at [facility], [address] [place], [place] [address]    Special Requests   Final    PLEURAL FLUID Performed at [facility], [address] [place], [place]  [address]    Gram Stain NO WBC SEEN NO ORGANISMS SEEN   Final   Culture   Final    RARE STAPHYLOCOCCUS EPIDERMIDIS NO ANAEROBES ISOLATED Performed at [facility], [address], [place], [place] [address]    Report Status 10/07/2023 FINAL  Final   Organism ID, Bacteria STAPHYLOCOCCUS EPIDERMIDIS  Final  Susceptibility   Staphylococcus epidermidis - MIC*    CIPROFLOXACIN <=0.5 SENSITIVE Sensitive     ERYTHROMYCIN <=0.25 SENSITIVE Sensitive     GENTAMICIN <=0.5 SENSITIVE Sensitive     OXACILLIN <=0.25 SENSITIVE Sensitive     TETRACYCLINE <=1 SENSITIVE Sensitive     VANCOMYCIN  2 SENSITIVE Sensitive     TRIMETH/SULFA <=10 SENSITIVE Sensitive     CLINDAMYCIN <=0.25 SENSITIVE Sensitive     RIFAMPIN <=0.5 SENSITIVE Sensitive     Inducible Clindamycin NEGATIVE Sensitive     * RARE STAPHYLOCOCCUS EPIDERMIDIS    Labs: CBC: Recent Labs  Lab 10/16/23 1400 10/18/23 1011  WBC 8.0 4.1  NEUTROABS  --  2.7  HGB 8.7* 8.0*  HCT 29.0* 27.0*  MCV 83.6 83.9  PLT 535* 387   Basic Metabolic Panel: Recent Labs  Lab 10/16/23 1400 10/18/23 1011  NA 137 136  K 3.5 3.3*  CL 103 103  CO2 25 26  GLUCOSE 93 151*  BUN 13 16  CREATININE 0.74 0.77  CALCIUM 8.8* 8.6*   Liver Function Tests: Recent Labs  Lab 10/16/23 1400 10/18/23 1011  AST 27 21  ALT 20 18  ALKPHOS 53 53  BILITOT 0.4 0.5  PROT 7.2 6.8  ALBUMIN  2.5* 2.3*   CBG: No results for input(s): GLUCAP in the last 168 hours.  Discharge time spent: greater than 30 minutes.  Signed: Burnard DELENA Cunning, DO [facility] 10/18/2023

## 2023-10-06 NOTE — Plan of Care (Signed)

## 2023-10-07 LAB — TROPONIN I (HIGH SENSITIVITY): Troponin I (High Sensitivity): 15 ng/L (ref <18)

## 2023-10-07 LAB — AEROBIC/ANAEROBIC CULTURE W GRAM STAIN (SURGICAL/DEEP WOUND): Gram Stain: NONE SEEN

## 2023-10-07 LAB — CHOLESTEROL, BODY FLUID: Cholesterol, Fluid: 33 mg/dL

## 2023-10-07 NOTE — ED Provider Notes (Signed)
 I was  asked to follow-up on labs.  Patient continues to be resting comfortably with some mild tachycardia and has normal oxygenation  on room air.  Labs including serial troponins have been negative.  He continues to be resting comfortably.  Plan will be for discharge at this time.   Cyrena Mylar, MD 10/07/23 [phone]

## 2023-10-11 ENCOUNTER — Encounter: Payer: Self-pay | Admitting: Oncology

## 2023-10-11 ENCOUNTER — Other Ambulatory Visit: Payer: Self-pay

## 2023-10-11 ENCOUNTER — Inpatient Hospital Stay

## 2023-10-11 ENCOUNTER — Other Ambulatory Visit: Payer: Self-pay | Admitting: Oncology

## 2023-10-11 ENCOUNTER — Inpatient Hospital Stay (HOSPITAL_BASED_OUTPATIENT_CLINIC_OR_DEPARTMENT_OTHER): Admitting: Hospice and Palliative Medicine

## 2023-10-11 VITALS — BP 136/77 | HR 99 | Temp 98.1°F | Resp 24 | Ht 70.0 in | Wt 137.5 lb

## 2023-10-11 DIAGNOSIS — C3432 Malignant neoplasm of lower lobe, left bronchus or lung: Secondary | ICD-10-CM

## 2023-10-11 DIAGNOSIS — J90 Pleural effusion, not elsewhere classified: Secondary | ICD-10-CM

## 2023-10-11 DIAGNOSIS — Z515 Encounter for palliative care: Secondary | ICD-10-CM | POA: Diagnosis not present

## 2023-10-11 DIAGNOSIS — G893 Neoplasm related pain (acute) (chronic): Secondary | ICD-10-CM | POA: Diagnosis not present

## 2023-10-11 LAB — CBC WITH DIFFERENTIAL (CANCER CENTER ONLY)
Abs Immature Granulocytes: 0.04 K/uL (ref 0.00–0.07)
Basophils Absolute: 0 K/uL (ref 0.0–0.1)
Basophils Relative: 0 %
Eosinophils Absolute: 0 K/uL (ref 0.0–0.5)
Eosinophils Relative: 1 %
HCT: 27 % — ABNORMAL LOW (ref 39.0–52.0)
Hemoglobin: 8.2 g/dL — ABNORMAL LOW (ref 13.0–17.0)
Immature Granulocytes: 1 %
Lymphocytes Relative: 6 %
Lymphs Abs: 0.6 K/uL — ABNORMAL LOW (ref 0.7–4.0)
MCH: 25.2 pg — ABNORMAL LOW (ref 26.0–34.0)
MCHC: 30.4 g/dL (ref 30.0–36.0)
MCV: 83.1 fL (ref 80.0–100.0)
Monocytes Absolute: 1 K/uL (ref 0.1–1.0)
Monocytes Relative: 11 %
Neutro Abs: 7.1 K/uL (ref 1.7–7.7)
Neutrophils Relative %: 81 %
Platelet Count: 504 K/uL — ABNORMAL HIGH (ref 150–400)
RBC: 3.25 MIL/uL — ABNORMAL LOW (ref 4.22–5.81)
RDW: 18.6 % — ABNORMAL HIGH (ref 11.5–15.5)
WBC Count: 8.7 K/uL (ref 4.0–10.5)
nRBC: 0 % (ref 0.0–0.2)

## 2023-10-11 LAB — CMP (CANCER CENTER ONLY)
ALT: 21 U/L (ref 0–44)
AST: 27 U/L (ref 15–41)
Albumin: 2.4 g/dL — ABNORMAL LOW (ref 3.5–5.0)
Alkaline Phosphatase: 54 U/L (ref 38–126)
Anion gap: 9 (ref 5–15)
BUN: 15 mg/dL (ref 8–23)
CO2: 23 mmol/L (ref 22–32)
Calcium: 8.7 mg/dL — ABNORMAL LOW (ref 8.9–10.3)
Chloride: 106 mmol/L (ref 98–111)
Creatinine: 1.01 mg/dL (ref 0.61–1.24)
GFR, Estimated: >60 mL/min (ref >60)
Glucose, Bld: 162 mg/dL — ABNORMAL HIGH (ref 70–99)
Potassium: 3.4 mmol/L — ABNORMAL LOW (ref 3.5–5.1)
Sodium: 138 mmol/L (ref 135–145)
Total Bilirubin: 0.5 mg/dL (ref 0.0–1.2)
Total Protein: 6.9 g/dL (ref 6.5–8.1)

## 2023-10-11 MED ORDER — SODIUM CHLORIDE 0.9 % IV SOLN
INTRAVENOUS | Status: DC
Start: 2023-10-11 — End: 2023-10-11
  Filled 2023-10-11: qty 250

## 2023-10-11 MED ORDER — SODIUM CHLORIDE 0.9 % IV SOLN
800.0000 mg/m2 | Freq: Once | INTRAVENOUS | Status: AC
  Administered 2023-10-11: 1482 mg via INTRAVENOUS
  Filled 2023-10-11: qty 38.98

## 2023-10-11 MED ORDER — PROCHLORPERAZINE MALEATE 10 MG PO TABS
10.0000 mg | ORAL_TABLET | Freq: Once | ORAL | Status: AC
  Administered 2023-10-11: 10 mg via ORAL
  Filled 2023-10-11: qty 1

## 2023-10-11 MED ORDER — NALOXONE HCL 4 MG/0.1ML NA LIQD: NASAL | 0 refills | Status: DC

## 2023-10-11 MED ORDER — LACTULOSE 10 GM/15ML PO SOLN: 10.0000 g | Freq: Two times a day (BID) | ORAL | 0 refills | Status: DC | PRN

## 2023-10-11 MED ORDER — LORAZEPAM 0.5 MG PO TABS: 0.5000 mg | ORAL_TABLET | Freq: Three times a day (TID) | ORAL | 0 refills | Status: DC | PRN

## 2023-10-11 MED ORDER — UMECLIDINIUM-VILANTEROL 62.5-25 MCG/ACT IN AEPB: 1.0000 | INHALATION_SPRAY | Freq: Every day | RESPIRATORY_TRACT | 3 refills | Status: DC

## 2023-10-11 MED ORDER — MORPHINE SULFATE ER 15 MG PO TBCR: 15.0000 mg | EXTENDED_RELEASE_TABLET | Freq: Two times a day (BID) | ORAL | 0 refills | Status: DC

## 2023-10-11 MED ORDER — ALBUTEROL SULFATE HFA 108 (90 BASE) MCG/ACT IN AERS: 2.0000 | INHALATION_SPRAY | RESPIRATORY_TRACT | 2 refills | Status: DC | PRN

## 2023-10-11 NOTE — Addendum Note (Signed)
 Addended by: MALKA DOMINO on: 10/11/2023 05:20 PM   Modules accepted: Orders

## 2023-10-11 NOTE — Progress Notes (Signed)
 Palliative Medicine [facility]  Telephone:([phone] [phone]   Name: Mitchell Frye Date: 10/11/2023 MRN: [id number]  DOB: [birth year]  Patient Care Team: Center, [facility] as PCP - General Anner, Alm ORN, MD as PCP - Cardiology (Cardiology) Verdene Gills, RN as Oncology Nurse Navigator Melanee Annah BROCKS, MD as Consulting Physician (Oncology)    REASON FOR CONSULTATION: Mitchell Frye is a 73 y.o. 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  He was referred to palliative care to help address goals and manage ongoing symptoms.  SOCIAL HISTORY:     reports that he quit smoking about 31 years ago. His smoking use included cigarettes. He started smoking about 51 years ago. He has a 20 pack-year smoking history. He has never used smokeless tobacco. He reports that he does not currently use alcohol. He reports that he does not use drugs.  Patient lives at home with his sister and two other brothers. His sister is his primary caregiver.  He does not drive.  ADVANCE DIRECTIVES:  Does not have  CODE STATUS:   PAST MEDICAL HISTORY: 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 (HCC)     PAST SURGICAL HISTORY:  Past Surgical History:  Procedure Laterality Date   CHEST TUBE INSERTION Right 04/25/2023   Procedure: RIGHT PLEURAL CHEST TUBE INSERTION;  Surgeon: Kerrin Elspeth BROCKS, MD;  Location: [facility] OR;  Service: Thoracic;  Laterality: Right;   ESOPHAGOGASTRODUODENOSCOPY (EGD) WITH PROPOFOL  N/A 12/01/2020   Procedure: ESOPHAGOGASTRODUODENOSCOPY (EGD) WITH PROPOFOL ;  Surgeon: Unk Corinn Skiff, MD;  Location: [facility] ENDOSCOPY;  Service: Gastroenterology;  Laterality: N/A;   IR THORACENTESIS ASP PLEURAL SPACE W/IMG  GUIDE  12/26/2022   PERICARDIAL FLUID DRAINAGE N/A 04/25/2023   Procedure: DRAINAGE, PERICARDIAL EFFUSION;  Surgeon: Kerrin Elspeth BROCKS, MD;  Location: [facility] OR;  Service: Thoracic;  Laterality: N/A;   PERICARDIAL WINDOW N/A 04/25/2023   Procedure: CREATION, PERICARDIAL WINDOW;  Surgeon: Kerrin Elspeth BROCKS, MD;  Location: [facility] OR;  Service: Thoracic;  Laterality: N/A;   PLEURAL EFFUSION DRAINAGE Bilateral 04/25/2023   Procedure: DRAINAGE, PLEURAL EFFUSION;  Surgeon: Kerrin Elspeth BROCKS, MD;  Location: [facility] OR;  Service: Thoracic;  Laterality: Bilateral;   PORTA CATH INSERTION N/A 10/22/2019   Procedure: PORTA CATH INSERTION;  Surgeon: Jama Cordella MATSU, MD;  Location: [facility] INVASIVE CV LAB;  Service: Cardiovascular;  Laterality: N/A;   VIDEO ASSISTED THORACOSCOPY (VATS)/DECORTICATION Left 04/25/2023   Procedure: VIDEO ASSISTED THORACOSCOPY (VATS)/DECORTICATION;  Surgeon: Kerrin Elspeth BROCKS, MD;  Location: [facility] OR;  Service: Thoracic;  Laterality: Left;   VIDEO BRONCHOSCOPY WITH ENDOBRONCHIAL ULTRASOUND N/A 09/25/2019   Procedure: VIDEO BRONCHOSCOPY WITH ENDOBRONCHIAL ULTRASOUND;  Surgeon: Tamea Dedra CROME, MD;  Location: [facility] ORS;  Service: Pulmonary;  Laterality: N/A;    HEMATOLOGY/ONCOLOGY HISTORY:  Oncology History  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)  10/04/2019 Initial Diagnosis   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)   10/04/2019 Cancer Staging   Staging form: Lung, AJCC 8th Edition - Clinical stage from 10/04/2019: Stage IIIB (cT2b, cN3, cM0) - Signed by Melanee Annah BROCKS, MD on 10/04/2019   10/28/2019 - 11/18/2019 Chemotherapy   The patient had dexamethasone  (DECADRON ) 4 MG tablet, 8 mg, Oral, Daily, 1 of 1 cycle, Start date: 10/04/2019, End  date: -- palonosetron  (ALOXI ) injection 0.25 mg, 0.25 mg, Intravenous,  Once, 4 of 4 cycles Administration: 0.25 mg (10/28/2019), 0.25 mg (11/04/2019), 0.25 mg (11/11/2019), 0.25 mg (11/18/2019) PACLitaxel -protein bound  (ABRAXANE ) chemo infusion 200 mg, 100 mg/m2 = 200 mg (100 % of original dose 100 mg/m2), Intravenous,  Once, 4 of 4 cycles Dose modification: 100 mg/m2 (original dose 100 mg/m2, Cycle 2), 80 mg/m2 (original dose 100 mg/m2, Cycle 3, Reason: Other (see comments), Comment: neutropenia), 100 mg/m2 (original dose 100 mg/m2, Cycle 1) Administration: 200 mg (11/04/2019), 175 mg (11/11/2019), 200 mg (10/29/2019), 175 mg (11/18/2019) CARBOplatin  (PARAPLATIN ) 210 mg in sodium chloride  0.9 % 250 mL chemo infusion, 210 mg (100 % of original dose 214.6 mg), Intravenous,  Once, 4 of 4 cycles Dose modification:   (original dose 214.6 mg, Cycle 1) Administration: 210 mg (10/28/2019), 210 mg (11/04/2019), 210 mg (11/11/2019), 210 mg (11/18/2019) PACLitaxel  (TAXOL ) 90 mg in sodium chloride  0.9 % 250 mL chemo infusion (</= 80mg /m2), 45 mg/m2 = 90 mg, Intravenous,  Once, 1 of 1 cycle Administration: 90 mg (10/28/2019)  for chemotherapy treatment.    11/25/2019 - 11/25/2019 Chemotherapy   The patient had palonosetron  (ALOXI ) injection 0.25 mg, 0.25 mg, Intravenous,  Once, 1 of 1 cycle Administration: 0.25 mg (11/25/2019) PEMEtrexed  (ALIMTA ) 1,000 mg in sodium chloride  0.9 % 100 mL chemo infusion, 500 mg/m2 = 1,000 mg, Intravenous,  Once, 1 of 1 cycle Administration: 1,000 mg (11/25/2019) CARBOplatin  (PARAPLATIN ) 550 mg in sodium chloride  0.9 % 250 mL chemo infusion, 550 mg (100 % of original dose 548 mg), Intravenous,  Once, 1 of 1 cycle Dose modification:   (original dose 548 mg, Cycle 1) Administration: 550 mg (11/25/2019) fosaprepitant  (EMEND) 150 mg in sodium chloride  0.9 % 145 mL IVPB, 150 mg, Intravenous,  Once, 1 of 1 cycle Administration: 150 mg (11/25/2019)  for chemotherapy treatment.    01/14/2020 - 11/13/2020 Chemotherapy   Patient is on Treatment Plan : LUNG Durvalumab  q14d     12/11/2020 - 10/01/2021 Chemotherapy   Patient is on Treatment Plan : LUNG NSCLC Pemetrexed  + Carboplatin  q21d x 4 Cycles      10/22/2021 - 01/14/2022 Chemotherapy   Patient is on Treatment Plan : LUNG Pemetrexed  (500) q21d      05/24/2023 -  Chemotherapy   Patient is on Treatment Plan : LUNG Gemcitabine  (1000) D1,8 q21d       ALLERGIES:  is allergic to taxol  [paclitaxel ].  MEDICATIONS:  Current Outpatient Medications  Medication Sig Dispense Refill   acetaminophen  (TYLENOL ) 500 MG tablet Take 500 mg by mouth every 6 (six) hours as needed for mild pain (pain score 1-3) (takes as needed for generalized pain.  Denies pain at this time.).     albuterol  (VENTOLIN  HFA) 108 (90 Base) MCG/ACT inhaler Inhale 2 puffs into the lungs every 4 (four) hours as needed for wheezing or shortness of breath. 6.7 g 2   ascorbic acid  (VITAMIN C ) 500 MG tablet Take 1 tablet (500 mg total) by mouth daily.     feeding supplement (ENSURE PLUS HIGH PROTEIN) LIQD Take 237 mLs by mouth 3 (three) times daily between meals.     iron  polysaccharides (NIFEREX) 150 MG capsule Take 1 capsule (150 mg total) by mouth daily. 30 capsule 0   megestrol  (MEGACE ) 40 MG tablet Take 1 tablet (40 mg total) by mouth daily. (Patient not taking: Reported on 10/01/2023) 30 tablet 2   Multiple Vitamin (MULTIVITAMIN WITH MINERALS) TABS tablet Take 1 tablet by mouth daily.  ondansetron  (ZOFRAN ) 8 MG tablet Take 1 tablet (8 mg total) by mouth every 8 (eight) hours as needed for nausea or vomiting. 30 tablet 1   oxyCODONE  (OXY IR/ROXICODONE ) 5 MG immediate release tablet Take 1 tablet (5 mg total) by mouth every 6 (six) hours as needed. 120 tablet 0   pantoprazole  (PROTONIX ) 20 MG tablet Take 1 tablet (20 mg total) by mouth every morning. 30 tablet 2   prochlorperazine  (COMPAZINE ) 10 MG tablet Take 1 tablet (10 mg total) by mouth every 6 (six) hours as needed for nausea or vomiting. 30 tablet 1   umeclidinium-vilanterol (ANORO ELLIPTA ) 62.5-25 MCG/ACT AEPB Inhale 1 puff into the lungs daily. 1 each 3   Vitamin D , Ergocalciferol , (DRISDOL ) 1.25 MG (50000 UNIT) CAPS  capsule Take 1 capsule (50,000 Units total) by mouth every 7 (seven) days. 5 capsule 0   No current facility-administered medications for this visit.   Facility-Administered Medications Ordered in Other Visits  Medication Dose Route Frequency Provider Last Rate Last Admin   0.9 %  sodium chloride  infusion   Intravenous Continuous Melanee Annah BROCKS, MD 10 mL/hr at 10/11/23 1036 New Bag at 10/11/23 1036   gemcitabine  (GEMZAR ) 1,482 mg in sodium chloride  0.9 % 250 mL chemo infusion  800 mg/m2 (Treatment Plan Recorded) Intravenous Once Rao, Archana C, MD 578 mL/hr at 10/11/23 1118 1,482 mg at 10/11/23 1118    VITAL SIGNS: There were no vitals taken for this visit. There were no vitals filed for this visit.  Estimated body mass index is 19.72 kg/m as calculated from the following:   Height as of an earlier encounter on 10/11/23: 5' 10 (1.778 m).   Weight as of an earlier encounter on 10/11/23: 137 lb 7.3 oz (62.4 kg).  LABS: CBC:    Component Value Date/Time   WBC 8.7 10/11/2023 1007   WBC 12.1 (H) 10/06/2023 2306   HGB 8.2 (L) 10/11/2023 1007   HCT 27.0 (L) 10/11/2023 1007   PLT 504 (H) 10/11/2023 1007   MCV 83.1 10/11/2023 1007   NEUTROABS 7.1 10/11/2023 1007   LYMPHSABS 0.6 (L) 10/11/2023 1007   MONOABS 1.0 10/11/2023 1007   EOSABS 0.0 10/11/2023 1007   BASOSABS 0.0 10/11/2023 1007   Comprehensive Metabolic Panel:    Component Value Date/Time   NA 138 10/11/2023 1007   K 3.4 (L) 10/11/2023 1007   CL 106 10/11/2023 1007   CO2 23 10/11/2023 1007   BUN 15 10/11/2023 1007   CREATININE 1.01 10/11/2023 1007   GLUCOSE 162 (H) 10/11/2023 1007   CALCIUM 8.7 (L) 10/11/2023 1007   AST 27 10/11/2023 1007   ALT 21 10/11/2023 1007   ALKPHOS 54 10/11/2023 1007   BILITOT 0.5 10/11/2023 1007   PROT 6.9 10/11/2023 1007   ALBUMIN  2.4 (L) 10/11/2023 1007    RADIOGRAPHIC STUDIES: DG Chest Port 1 View Result Date: 10/06/2023 CLINICAL DATA:  [id number]. Chest pain. Follow-up left  hydropneumothorax para EXAM: PORTABLE CHEST 1 VIEW COMPARISON:  Portable chest earlier today at 4:32 p.m. FINDINGS: 10:40 p.m. Loculated hydropneumothorax again noted in the lateral left mid to lower chest, with underlying consolidation change left mid perihilar area and left base. Small layering right pleural effusion with overlying patchy atelectasis or airspace disease right base. The more cephalad lungs are clear. Overall aeration is unchanged. Cardiac size is normal. Right IJ port catheter again terminates in the upper right atrium. The mediastinum is stable. Slight dextroscoliosis with no new osseous findings. Aortic atherosclerosis. IMPRESSION: 1. No significant  change in the loculated hydropneumothorax in the lateral left mid to lower chest, with underlying consolidation change left mid perihilar area and left base. 2. Small layering right pleural effusion with overlying patchy atelectasis or airspace disease right base. Electronically Signed   By: Francis Quam M.D.   On: 10/06/2023 22:57   DG Chest Port 1 View Result Date: 10/06/2023 CLINICAL DATA:  [id number] Dyspnea [id number] EXAM: PORTABLE CHEST - 1 VIEW COMPARISON:  October 06, 2023 7:06 a.m.; October 05, 2023, October 04, 2023 FINDINGS: Unchanged small right pleural effusion, likely loculated. Similar appearance of the loculated pleural gas on the left. Patchy opacities in perihilar left lung, likely atelectasis. Layering effusion is also likely present on the left. No cardiomegaly. Right chest port terminates at the cavoatrial junction. Multilevel thoracic osteophytosis. IMPRESSION: 1. Similar amount of loculated pleural gas on the left with perihilar atelectasis in the left lung. 2. Small loculated right pleural effusion with right basilar atelectasis, also unchanged. Electronically Signed   By: Rogelia Myers M.D.   On: 10/06/2023 17:09   DG Chest Port 1 View Result Date: 10/06/2023 CLINICAL DATA:  Left chest tube withdrawal EXAM: PORTABLE CHEST 1  VIEW COMPARISON:  10/05/2023 FINDINGS: Pigtail thoracostomy tube on the left as been removed. The amount of pleural air lateral to the left mid lung and lower lung does not appear increased. Associated volume loss in the left perihilar region and lower lobe is stable. Right pleural effusion and lower lung volume loss is stable. Port-A-Cath again seen. No new or worsening finding otherwise. IMPRESSION: Removal of the left thoracostomy tube. No increase in the amount of pleural air on the left. Stable volume loss in the left perihilar region and lower lobe. Stable right pleural effusion and lower lung volume loss. Electronically Signed   By: Oneil Officer M.D.   On: 10/06/2023 10:27   DG Chest Port 1 View Result Date: 10/05/2023 EXAM: 1 VIEW XRAY OF THE CHEST 10/05/2023 08:40:56 AM COMPARISON: AP radiograph of the chest dated 10/03/2023. CLINICAL HISTORY: Pleural effusion. FINDINGS: LUNGS AND PLEURA: Bilateral pleural effusions are present. Irregularly reticular opacities are also present within the mid and lower lung zones. There is likely loculated pneumothorax present laterally within the left chest cavity. HEART AND MEDIASTINUM: No acute abnormality of the cardiac and mediastinal silhouettes. BONES AND SOFT TISSUES: No acute osseous abnormality. A pigtail drainage catheter is noted at the left lung base. A right internal jugular chest port is also again demonstrated. IMPRESSION: 1. Bilateral pleural effusions. 2. Likely loculated pneumothorax in the left chest cavity. 3. Irregular reticular opacities in the mid and lower lung zones. 4. Pigtail drainage catheter at the left lung base and right internal jugular chest port. Electronically signed by: Evalene Coho MD 10/05/2023 08:45 AM EDT RP Workstation: [id number]   CT CHEST WO CONTRAST Result Date: 10/04/2023 CLINICAL DATA:  Pleural effusion, history of lung cancer * Tracking Code: BO * EXAM: CT CHEST WITHOUT CONTRAST TECHNIQUE: Multidetector CT imaging of  the chest was performed following the standard protocol without IV contrast. RADIATION DOSE REDUCTION: This exam was performed according to the departmental dose-optimization program which includes automated exposure control, adjustment of the mA and/or kV according to patient size and/or use of iterative reconstruction technique. COMPARISON:  10/01/2023 FINDINGS: Cardiovascular: Right Port-A-Cath tip: Cavoatrial junction. Mild aortic and coronary atherosclerotic vascular calcification. Mild cardiomegaly. Mediastinum/Nodes: Low-grade background stranding in the mediastinal adipose tissue potentially from third spacing of fluids. Trace pericardial effusion. Gas-filled upper thoracic esophagus. Lungs/Pleura: Centrilobular  emphysema. Moderate to large left hydropneumothorax with a left pleural drainage catheter within frothy material along the posterior lung base, cannot exclude empyema given the complexity. There is some passive atelectasis in the left upper lobe and left lower lobe. Stable 6 mm right middle lobe subpleural nodule along the minor fissure. Mildly increased peripheral airspace opacity laterally in the right lower lobe for example on image 99 series 4. Peripheral atelectasis in the right lower lobe is partially passive due to a small to moderate right pleural effusion. Left perihilar consolidation is again observed. Upper Abdomen: Unremarkable Musculoskeletal: Unremarkable IMPRESSION: 1. Moderate to large left hydropneumothorax with a left pleural drainage catheter within frothy material along the posterior lung base, cannot exclude empyema given the complexity. 2. Small to moderate right pleural effusion with passive atelectasis in the right lower lobe. 3. Mildly increased peripheral airspace opacity laterally in the right lower lobe, potentially from pneumonia. 4. Stable 6 mm right middle lobe subpleural nodule along the minor fissure. 5. Mild cardiomegaly. 6. Low-grade background stranding in the  mediastinal adipose tissue potentially from third spacing of fluids. 7. Stable left perihilar consolidation. 8. Aortic Atherosclerosis (ICD10-I70.0) and Emphysema (ICD10-J43.9). Electronically Signed   By: Ryan Salvage M.D.   On: 10/04/2023 13:50   DG Chest Port 1 View Result Date: 10/03/2023 CLINICAL DATA:  Pleural effusion. EXAM: PORTABLE CHEST 1 VIEW COMPARISON:  October 01, 2023. FINDINGS: Stable cardiomediastinal silhouette. Right internal jugular Port-A-Cath is unchanged. Interval placement of pigtail catheter in the left lung base. Left pleural effusion is significantly smaller. Probable ex vacuo pneumothorax is noted laterally. Stable small right pleural effusion with associated atelectasis. Bony thorax is unremarkable. IMPRESSION: Interval placement of pigtail catheter in left lung base. Left pleural effusion is significantly smaller. Probable ex vacuo pneumothorax is noted laterally on the left. Electronically Signed   By: Lynwood Landy Raddle M.D.   On: 10/03/2023 12:01   CT [facility] PLEURAL DRAIN W/INDWELL CATH W/IMG GUIDE Result Date: 10/02/2023 INDICATION: 73 year old male with complex left pleural effusion/empyema. He presents for placement of a chest tube. EXAM: CT-guided chest tube placement TECHNIQUE: Multidetector CT imaging of the chest was performed following the standard protocol without IV contrast. RADIATION DOSE REDUCTION: This exam was performed according to the departmental dose-optimization program which includes automated exposure control, adjustment of the mA and/or kV according to patient size and/or use of iterative reconstruction technique. MEDICATIONS: The patient is currently admitted to the hospital and receiving intravenous antibiotics. The antibiotics were administered within an appropriate time frame prior to the initiation of the procedure. ANESTHESIA/SEDATION: Moderate (conscious) sedation was employed during this procedure. A total of Versed  1 mg and Fentanyl  50 mcg was  administered intravenously by the radiology nurse. Total intra-service moderate Sedation Time: 13 minutes. The patient's level of consciousness and vital signs were monitored continuously by radiology nursing throughout the procedure under my direct supervision. COMPLICATIONS: None immediate. PROCEDURE: Informed written consent was obtained from the patient after a thorough discussion of the procedural risks, benefits and alternatives. All questions were addressed. Maximal Sterile Barrier Technique was utilized including caps, mask, sterile gowns, sterile gloves, sterile drape, hand hygiene and skin antiseptic. A timeout was performed prior to the initiation of the procedure. A planning axial CT scan was performed. The loculated left pleural effusion was identified. A suitable skin entry site was selected and marked. Local anesthesia was attained by infiltration with 1% lidocaine . A small dermatotomy was made. Under intermittent CT guidance, an 18 gauge trocar needle was advanced  over a rib and into the fluid collection. A 0.035 wire was then coiled in the fluid collection. The percutaneous tract was dilated to 14 [place]. A Cook 14 [place] all-purpose drainage catheter modified with additional sideholes was advanced over the wire and formed. Aspiration yields turbid bloody fluid. Samples were sent for Gram stain and culture. The tube was then connected to low wall suction via a pleura vac atrium. The catheter was secured to the skin with 0 Prolene suture. Bandages were applied. IMPRESSION: Successful placement of 14 French chest tube into the left complex pleural effusion. Electronically Signed   By: Wilkie Lent M.D.   On: 10/02/2023 15:50   CT Angio Chest Pulmonary Embolism (PE) W or WO Contrast Result Date: 10/01/2023 CLINICAL DATA:  Worsening chest pain and shortness of breath for months, history of lung cancer EXAM: CT ANGIOGRAPHY CHEST WITH CONTRAST TECHNIQUE: Multidetector CT imaging of the chest was  performed using the standard protocol during bolus administration of intravenous contrast. Multiplanar CT image reconstructions and MIPs were obtained to evaluate the vascular anatomy. RADIATION DOSE REDUCTION: This exam was performed according to the departmental dose-optimization program which includes automated exposure control, adjustment of the mA and/or kV according to patient size and/or use of iterative reconstruction technique. CONTRAST:  75mL OMNIPAQUE  IOHEXOL  350 MG/ML SOLN COMPARISON:  08/09/2023, 10/01/2023 FINDINGS: Cardiovascular: This is a technically adequate evaluation of the pulmonary vasculature. No filling defects or pulmonary emboli. The heart is unremarkable without pericardial effusion. No evidence of thoracic aortic aneurysm or dissection. Atherosclerosis of the aorta and coronary vasculature. Mediastinum/Nodes: No enlarged mediastinal, hilar, or axillary lymph nodes. Thyroid  gland, trachea, and esophagus demonstrate no significant findings. Lungs/Pleura: There is a small partially loculated right pleural effusion, decreased since prior study after interval thoracentesis. Small amount of gas within the pleural space consistent with thoracentesis 09/28/2023. Persistent large multiloculated left pleural effusion, with resolution of the pleural gas seen previously. Stable dense consolidation in the left perihilar region with air bronchograms and bronchiectasis again noted. Dependent consolidation within the right lower lobe again identified, compatible with atelectasis. 6 mm subpleural right middle lobe pulmonary nodule image 68/5 not appreciably changed since prior study. 7 x 8 mm right middle lobe pulmonary nodule image 83/5 is also unchanged. No new nodules or masses. Stable emphysema.  Central airways are patent. Upper Abdomen: No acute abnormality. Musculoskeletal: No acute or destructive bony abnormalities. Reconstructed images demonstrate no additional findings. Review of the MIP images  confirms the above findings. IMPRESSION: 1. No evidence of pulmonary embolus. 2. Stable right middle lobe pulmonary nodules, metastatic disease cannot be excluded. Continued attention on follow-up recommended. 3. Loculated right pleural effusion, decreased since prior study. Minimal right pleural gas consistent with recent thoracentesis 09/28/2023. 4. Continued large multiloculated left pleural effusion, with resolution of the left pleural gas seen previously. 5. Stable left perihilar consolidation with air bronchograms and bronchiectasis. This may reflect post therapeutic change. 6. Aortic Atherosclerosis (ICD10-I70.0) and Emphysema (ICD10-J43.9). Electronically Signed   By: Ozell Daring M.D.   On: 10/01/2023 12:58   DG Chest 2 View Result Date: 10/01/2023 CLINICAL DATA:  Chest pain. EXAM: DG CHEST 2V COMPARISON:  09/28/2023 FINDINGS: Bilateral pleural effusions again noted with probable loculated component on the left. Left hilar opacity is similar to prior. Right Port-A-Cath remains in place. Cardiopericardial silhouette is at upper limits of normal for size. No acute bony abnormality. Telemetry leads overlie the chest. IMPRESSION: 1. No substantial change. Bilateral pleural effusions with probable loculated component on  the left. 2. Left hilar opacity is similar to prior. Electronically Signed   By: Camellia Candle M.D.   On: 10/01/2023 09:26   DG Chest Port 1 View Result Date: 09/28/2023 CLINICAL DATA:  s/p right thoracentesis EXAM: PORTABLE CHEST 1 VIEW COMPARISON:  IR thoracentesis, earlier same day. Chest XR, 09/27/2023. CT chest, 09/08/2023. FINDINGS: Support lines; RIGHT chest port, catheter tip well-positioned at the superior cavoatrial junction Obscured LEFT cardiac border. Patchy RIGHT lung nodular opacities, the RIGHT chest is otherwise relatively clear. Improved aeration of the RIGHT chest post thoracentesis without residual pleural effusion. No pneumothorax. Similar degree of LEFT hilar  prominence, basilar mass and pleural thickening. No interval osseous abnormality. IMPRESSION: 1. Improved aeration of the RIGHT chest post thoracentesis without residual pleural effusion. No pneumothorax 2. Similar prominence of the LEFT hilum, basilar mass and pleural thickening. Electronically Signed   By: Thom Hall M.D.   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 Request for therapeutic thoracentesis. EXAM: ULTRASOUND GUIDED RIGHT THORACENTESIS MEDICATIONS: 1% lidocaine , 8 mL. COMPLICATIONS: None immediate. PROCEDURE: An ultrasound guided thoracentesis was thoroughly discussed with the patient and questions answered. The benefits, risks, alternatives and complications were also discussed. The patient understands and wishes to proceed with the procedure. Written consent was obtained. Ultrasound was performed to localize and mark an adequate pocket of fluid in the RIGHT chest. The area was then prepped and draped in the normal sterile fashion. 1% Lidocaine  was used for local anesthesia. Under ultrasound guidance a 6 Fr Safe-T-Centesis catheter was introduced. Thoracentesis was performed. The catheter was removed and a dressing applied. FINDINGS: A total of approximately 500 mL of bloody fluid was removed. IMPRESSION: Successful ultrasound guided RIGHT thoracentesis yielding 500 mL of pleural fluid. Procedure performed by: Sherrilee Bal, PA-C under the supervision of Thom Hall, MD Electronically Signed   By: Thom Hall M.D.   On: 09/28/2023 11:53   DG Chest 2 View Result Date: 09/27/2023 CLINICAL DATA:  Shortness of breath, chest tightness. EXAM: CHEST - 2 VIEW COMPARISON:  September 23, 2023. FINDINGS: Stable cardiomediastinal silhouette. Right internal jugular Port-A-Cath is unchanged. Stable loculated pleural effusions  are noted bilaterally, left greater than right. Associated bibasilar scarring or atelectasis is noted. Bony thorax is unremarkable. IMPRESSION: Stable loculated bilateral pleural effusions with associated bibasilar scarring or atelectasis. Electronically Signed   By: Lynwood Landy Raddle M.D.   On: 09/27/2023 13:26   DG Chest Portable 1 View Result Date: 09/23/2023 CLINICAL DATA:  sob EXAM: PORTABLE CHEST - 1 VIEW COMPARISON:  September 22, 2023 FINDINGS: Unchanged small right and moderate volume left pleural effusion, likely loculated, with left mid and lower lung zone atelectasis. No new airspace consolidation or pneumothorax. No cardiomegaly. Right chest port in place terminating at the cavoatrial junction. Aortic atherosclerosis. No acute fracture or destructive lesion. IMPRESSION: No interval significant change to the small right and moderate volume left pleural effusions. Electronically Signed   By: Rogelia Myers M.D.   On: 09/23/2023 11:34   US  THORACENTESIS ASP PLEURAL SPACE W/IMG GUIDE Result Date: 09/22/2023 INDICATION: 73 year old male with a history of left lower lobe lung cancer with recurrent pleural effusions who presented to the ED with worsening shortness of breath. Request for therapeutic thoracentesis. EXAM: ULTRASOUND GUIDED RIGHT THORACENTESIS MEDICATIONS: 1% lidocaine , 7 mL. COMPLICATIONS: None immediate. PROCEDURE: An ultrasound guided thoracentesis was  thoroughly discussed with the patient and questions answered. The benefits, risks, alternatives and complications were also discussed. The patient understands and wishes to proceed with the procedure. Written consent was obtained. Ultrasound was performed to localize and mark an adequate pocket of fluid in the right chest. The area was then prepped and draped in the normal sterile fashion. 1% Lidocaine  was used for local anesthesia. Under ultrasound guidance a 6 Fr Safe-T-Centesis catheter was introduced. Thoracentesis was performed. The catheter  was removed and a dressing applied. FINDINGS: A total of approximately 500 mL of bloody fluid was removed. IMPRESSION: Successful ultrasound guided right thoracentesis yielding 500 mL of pleural fluid. Procedure performed by: Sherrilee Bal, PA-C under the supervision of Dr. DELENA Balder Electronically Signed   By: Juliene Balder M.D.   On: 09/22/2023 16:41   DG Chest Port 1 View Result Date: 09/22/2023 CLINICAL DATA:  Status post right thoracentesis. EXAM: PORTABLE CHEST 1 VIEW COMPARISON:  Earlier today. FINDINGS: Small to moderate-sized right pleural effusion with a decrease in amount. No pneumothorax. Stable partially loculated moderate-sized left pleural effusion and perihilar density. Stable right jugular porta catheter. Unremarkable bones. IMPRESSION: 1. Small to moderate-sized right pleural effusion with a decrease in amount. No pneumothorax. 2. Stable partially loculated moderate-sized left pleural effusion and perihilar density. Electronically Signed   By: Elspeth Bathe M.D.   On: 09/22/2023 15:47   DG Chest 2 View Result Date: 09/22/2023 CLINICAL DATA:  Shortness of breath. EXAM: CHEST - 2 VIEW COMPARISON:  09/07/2023 FINDINGS: Right IJ Port-A-Cath unchanged. Moderate size left effusion unchanged. Stable left perihilar opacification. Interval worsening moderate size right pleural effusion. Likely associated right basilar atelectasis. Cardiomediastinal silhouette and remainder of the exam is unchanged. IMPRESSION: 1. Interval worsening moderate size right pleural effusion with likely associated right basilar atelectasis. 2. Stable moderate size left effusion. Stable left perihilar opacification. Electronically Signed   By: Toribio Agreste M.D.   On: 09/22/2023 11:53    PERFORMANCE STATUS (ECOG) : 1  Review of Systems Unless otherwise noted, a complete review of systems is negative.  Physical Exam General: NAD HEENT: white patches on tongue Pulmonary: Unlabored Extremities: no edema, no joint  deformities Skin: no rashes Neurological: Weakness but otherwise nonfocal  IMPRESSION: Patient was an add-on to my schedule at Dr. Darold request.  Patient seen in infusion.  He was accompanied by his sister.  Patient doing poorly.  He has had loculated pleural effusion requiring frequent thoracentesis.  Patient is status post decortication.  He is now on O2.  Patient says that he previously had good improvement with shortness of breath and symptoms after thoracentesis.  However, now he says that any improvement made last 30 minutes at most.  His functional status is limited by his exertional dyspnea.  Patient describes significant anxiety, particularly when he is short of breath.  Will start him on low-dose lorazepam .  He also endorses ongoing pain, unrelieved by oxycodone .  Patient says he is taking oxycodone  frequently but he has not the best historian.  Patient was previously on MS Contin  but that was stopped as it was not clear that he was taking it.  Both patient and sister are interested in retrying MS Contin  to see if that improves his pain.  Patient has had constipation.  He is taking Senokot but unclear how frequently.  Will send in Rx for lactulose .  Patient says that he was informed in the hospital that I have 2 months to live.  We talked about this in  the context of stage IV cancer with probable limited future treatment options.  At this time, patient seems committed to pursuing any and all interventions available to him.  However, we discussed the probable future need to focus on symptom management, comfort and quality of life if treatment options are exhausted.  We also briefly discussed future hospice involvement.  I strongly encouraged patient to consider end-of-life decision-making.  I sent him home with a MOST form to review with family.  Patient says that he thinks he would want to remain a full code despite our discussing the almost certain futility associated with such  interventions in the context of an incurable cancer.  Patient agrees to speak in more detail with his family.  He is also interested in speaking with Dr. Melanee.   PLAN: -Continue current scope of treatment -Continue as needed thoracentesis -Restart MS Contin  15 mg every 12 hours #30 -Continue oxycodone  5 mg every 6 hours as needed for breakthrough pain -Lorazepam  0.5 mg every 8 hours as needed -Lactulose  as needed for constipation -PDMP reviewed -Naloxone  -MOST form reviewed -RTC 2 to 3 weeks  Case and plan discussed with Dr. Melanee  Patient expressed understanding and was in agreement with this plan. He also understands that He can call the clinic at any time with any questions, concerns, or complaints.     Time Total: 30 minutes  Visit consisted of counseling and education dealing with the complex and emotionally intense issues of symptom management and palliative care in the setting of serious and potentially life-threatening illness.Greater than 50%  of this time was spent counseling and coordinating care related to the above assessment and plan.  Signed by: Fonda Mower, PhD, NP-C

## 2023-10-11 NOTE — Progress Notes (Signed)
 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 Recent hospitalizations and thoracentesis.  Palliative care following.  Met with patient and sister today.  Patient reports poor po intake for couple months now.  I am so constipated.  Reports no bowel movement since Sat/Sunday.  Says that he is taking a medication that starts with s but it is not helping.  Likes ensure/boost shakes.  Says that he eats sardines, chicken noodle soup, pork and beans.    NFPE:Deferred today  Medications: Vit D, compazine , protonix   Labs: K 3.4, glucose 162  Anthropometrics:   Height: 70 inches Weight: 137 lb 7.3 oz 147 lb on 8/22 (hospital weight, ? Accuracy fluids, bed wt?) 142 lb on 8/19 148 lb on 6/24 157 lb on 5/21 BMI: 19  13% weight loss in the last 3 months, significant   Estimated Energy Needs  Kcals: 1550-1800 Protein: 74-93 g Fluid: 1500-1800 ml  NUTRITION DIAGNOSIS: Unintentional weight loss related to cancer, shortness of breath as evidenced by 13% weight loss in the last 3 months and meeting less than 75% of estimate energy needs for > 1 month   MALNUTRITION DIAGNOSIS: Patient meets criteria for severe malnutrition in context of chronic illness as evidenced by 13% weight loss in the last 3 months and eating less than 75% of energy needs for > 1 month   INTERVENTION:  Recommend 350 calorie shake or higher as often as able (2-4/day). Samples of ensure complete given and coupons Discussed ways to add calories and protein in diet.  NP adding lactulose  for constipation today Contact information given    MONITORING, EVALUATION, GOAL: weight trends, intake   NEXT VISIT: Wednesday, Sept 17 during infusion  Mitchell Frye SOLON, CSO, LDN Registered Dietitian [phone]

## 2023-10-11 NOTE — Patient Instructions (Signed)
 [facility] - A DEPT OF [facility]  Discharge Instructions: Thank you for choosing [facility] to provide your oncology and hematology care.  If you have a lab appointment with the Cancer Center, please go directly to the Cancer Center and check in at the registration area.  Wear comfortable clothing and clothing appropriate for easy access to any Portacath or PICC line.   We strive to give you quality time with your provider. You may need to reschedule your appointment if you arrive late (15 or more minutes).  Arriving late affects you and other patients whose appointments are after yours.  Also, if you miss three or more appointments without notifying the office, you may be dismissed from the clinic at the provider's discretion.      For prescription refill requests, have your pharmacy contact our office and allow 72 hours for refills to be completed.    Today you received the following chemotherapy and/or immunotherapy agents Gemzar      To help prevent nausea and vomiting after your treatment, we encourage you to take your nausea medication as directed.  BELOW ARE SYMPTOMS THAT SHOULD BE REPORTED IMMEDIATELY: *FEVER GREATER THAN 100.4 F (38 C) OR HIGHER *CHILLS OR SWEATING *NAUSEA AND VOMITING THAT IS NOT CONTROLLED WITH YOUR NAUSEA MEDICATION *UNUSUAL SHORTNESS OF BREATH *UNUSUAL BRUISING OR BLEEDING *URINARY PROBLEMS (pain or burning when urinating, or frequent urination) *BOWEL PROBLEMS (unusual diarrhea, constipation, pain near the anus) TENDERNESS IN MOUTH AND THROAT WITH OR WITHOUT PRESENCE OF ULCERS (sore throat, sores in mouth, or a toothache) UNUSUAL RASH, SWELLING OR PAIN  UNUSUAL VAGINAL DISCHARGE OR ITCHING   Items with * indicate a potential emergency and should be followed up as soon as possible or go to the Emergency Department if any problems should occur.  Please show the CHEMOTHERAPY ALERT CARD or IMMUNOTHERAPY ALERT  CARD at check-in to the Emergency Department and triage nurse.  Should you have questions after your visit or need to cancel or reschedule your appointment, please contact [facility] - A DEPT OF [facility]  [phone] and follow the prompts.  Office hours are 8:00 a.m. to 4:30 p.m. Monday - Friday. Please note that voicemails left after 4:00 p.m. may not be returned until the following business day.  We are closed weekends and major holidays. You have access to a nurse at all times for urgent questions. Please call the main number to the clinic [phone] and follow the prompts.  For any non-urgent questions, you may also contact your provider using MyChart. We now offer e-Visits for anyone 60 and older to request care online for non-urgent symptoms. For details visit [email or web address].   Also download the MyChart app! Go to the app store, search "MyChart", open the app, select [place], and log in with your MyChart username and password.

## 2023-10-12 ENCOUNTER — Telehealth: Payer: Self-pay | Admitting: Oncology

## 2023-10-12 ENCOUNTER — Other Ambulatory Visit: Payer: Self-pay | Admitting: Hospice and Palliative Medicine

## 2023-10-12 NOTE — Telephone Encounter (Signed)
 Called pt to schedule CT - left vm - LH

## 2023-10-13 ENCOUNTER — Ambulatory Visit
Admission: RE | Admit: 2023-10-13 | Discharge: 2023-10-13 | Disposition: A | Discharge status: Home / Self Care | Source: Ambulatory Visit | Attending: Oncology | Admitting: Oncology

## 2023-10-13 ENCOUNTER — Other Ambulatory Visit: Payer: Self-pay | Admitting: Hospice and Palliative Medicine

## 2023-10-13 DIAGNOSIS — C3432 Malignant neoplasm of lower lobe, left bronchus or lung: Secondary | ICD-10-CM | POA: Diagnosis present

## 2023-10-13 MED ORDER — IOHEXOL 300 MG/ML  SOLN
80.0000 mL | Freq: Once | INTRAMUSCULAR | Status: AC | PRN
  Administered 2023-10-13: 80 mL via INTRAVENOUS

## 2023-10-16 ENCOUNTER — Emergency Department
Admission: EM | Admit: 2023-10-16 | Discharge: 2023-10-16 | Disposition: A | Discharge status: Home / Self Care | Attending: Emergency Medicine | Admitting: Emergency Medicine

## 2023-10-16 ENCOUNTER — Emergency Department

## 2023-10-16 ENCOUNTER — Other Ambulatory Visit: Payer: Self-pay

## 2023-10-16 DIAGNOSIS — J449 Chronic obstructive pulmonary disease, unspecified: Secondary | ICD-10-CM | POA: Diagnosis not present

## 2023-10-16 DIAGNOSIS — J9610 Chronic respiratory failure, unspecified whether with hypoxia or hypercapnia: Secondary | ICD-10-CM | POA: Insufficient documentation

## 2023-10-16 DIAGNOSIS — C252 Malignant neoplasm of tail of pancreas: Secondary | ICD-10-CM | POA: Insufficient documentation

## 2023-10-16 DIAGNOSIS — K5900 Constipation, unspecified: Secondary | ICD-10-CM | POA: Insufficient documentation

## 2023-10-16 DIAGNOSIS — R109 Unspecified abdominal pain: Secondary | ICD-10-CM

## 2023-10-16 DIAGNOSIS — R1084 Generalized abdominal pain: Secondary | ICD-10-CM | POA: Diagnosis present

## 2023-10-16 DIAGNOSIS — C801 Malignant (primary) neoplasm, unspecified: Secondary | ICD-10-CM

## 2023-10-16 LAB — URINALYSIS, ROUTINE W REFLEX MICROSCOPIC
Bacteria, UA: NONE SEEN
Bilirubin Urine: NEGATIVE
Glucose, UA: NEGATIVE mg/dL
Hgb urine dipstick: NEGATIVE
Ketones, ur: NEGATIVE mg/dL
Leukocytes,Ua: NEGATIVE
Nitrite: NEGATIVE
Protein, ur: 30 mg/dL — AB
Specific Gravity, Urine: 1.019 (ref 1.005–1.030)
pH: 5 (ref 5.0–8.0)

## 2023-10-16 LAB — COMPREHENSIVE METABOLIC PANEL WITH GFR
ALT: 20 U/L (ref 0–44)
AST: 27 U/L (ref 15–41)
Albumin: 2.5 g/dL — ABNORMAL LOW (ref 3.5–5.0)
Alkaline Phosphatase: 53 U/L (ref 38–126)
Anion gap: 9 (ref 5–15)
BUN: 13 mg/dL (ref 8–23)
CO2: 25 mmol/L (ref 22–32)
Calcium: 8.8 mg/dL — ABNORMAL LOW (ref 8.9–10.3)
Chloride: 103 mmol/L (ref 98–111)
Creatinine, Ser: 0.74 mg/dL (ref 0.61–1.24)
GFR, Estimated: >60 mL/min (ref >60)
Glucose, Bld: 93 mg/dL (ref 70–99)
Potassium: 3.5 mmol/L (ref 3.5–5.1)
Sodium: 137 mmol/L (ref 135–145)
Total Bilirubin: 0.4 mg/dL (ref 0.0–1.2)
Total Protein: 7.2 g/dL (ref 6.5–8.1)

## 2023-10-16 LAB — CBC
HCT: 29 % — ABNORMAL LOW (ref 39.0–52.0)
Hemoglobin: 8.7 g/dL — ABNORMAL LOW (ref 13.0–17.0)
MCH: 25.1 pg — ABNORMAL LOW (ref 26.0–34.0)
MCHC: 30 g/dL (ref 30.0–36.0)
MCV: 83.6 fL (ref 80.0–100.0)
Platelets: 535 K/uL — ABNORMAL HIGH (ref 150–400)
RBC: 3.47 MIL/uL — ABNORMAL LOW (ref 4.22–5.81)
RDW: 18.3 % — ABNORMAL HIGH (ref 11.5–15.5)
WBC: 8 K/uL (ref 4.0–10.5)
nRBC: 0 % (ref 0.0–0.2)

## 2023-10-16 LAB — LIPASE, BLOOD: Lipase: 35 U/L (ref 11–51)

## 2023-10-16 MED ORDER — LACTULOSE 10 GM/15ML PO SOLN
20.0000 g | Freq: Once | ORAL | Status: AC
  Administered 2023-10-16: 20 g via ORAL
  Filled 2023-10-16: qty 30

## 2023-10-16 MED ORDER — SMOG ENEMA
960.0000 mL | Freq: Once | RECTAL | Status: AC
  Administered 2023-10-16: 960 mL via RECTAL
  Filled 2023-10-16: qty 960

## 2023-10-16 NOTE — ED Notes (Signed)
 Family called to transport patient back home. Spoke to [place] who states she is on her way to pick the patient up.

## 2023-10-16 NOTE — ED Provider Notes (Signed)
 Patient reports he would like to go home.  Reports his sister helps care for him, and he supposed to be getting lactulose  daily but sometimes he has not been taking it daily.  He feels much better now, reports that he feels like he is had a lot of good stooling and his stomach no longer hurts.  He feels well and no longer feels constipated.  He is alert oriented without distress.  Revises feeling of constipation is resolved.  No nausea no vomiting.  Feels much improved.  At this juncture, with patient reporting having stool, symptoms resolving I think it is appropriate for him to discharge with careful return precautions which I discussed with him.  He will work to stay more compliant with use of his daily lactulose  as well  Return precautions and treatment recommendations and follow-up discussed with the patient who is agreeable with the plan.    Dicky Anes, MD 10/16/23 [phone]

## 2023-10-16 NOTE — Discharge Instructions (Addendum)
 You were seen in the emergency department today for constipation.  We recommend that you use one your lactulose  daily.  This will help relieve some of your constipation.  Please return to the Emergency Department immediately if you develop new or worsening symptoms that concern you, have vomiting, feel your constipation has returned or you cannot pass stool or gas, fever , severe abdominal pain, or persistent vomiting, or other concerns arise.

## 2023-10-16 NOTE — ED Triage Notes (Addendum)
 Pt arrived via POV for constipation and is having abd pain. Pt sts that he has not been able to use the bathroom for the last two weeks. Pt was recently here on 08/22 for the same. Pt advised that he is a cancer patient.

## 2023-10-16 NOTE — ED Notes (Signed)
 Staff not able to get blood while in triage. RN called phlebotomy.

## 2023-10-16 NOTE — ED Provider Notes (Signed)
 [facility] Provider Note    Event Date/Time   First MD Initiated Contact with Patient 10/16/23 1401     (approximate)   History   Abdominal Pain   HPI  Mitchell Frye is a 73 y.o. male  a history of adenocarcinoma of the lung, COPD (on 2L of O2), recurrent pleural effusion, newly followed by [facility] care medicine who presents to the emergency department with crampy generalized abdominal pain and constipation.  Patient states that his last bowel movement was 2 months ago.  He is on chronic opioids and his opioid dose was recently increased.  He did have a CT chest abdomen pelvis completed as an outpatient on 10/13/2023 which demonstrated worsening constipation.  He is prescribed oral lactulose  and reports that he has taken it.  He has not tried any enemas or suppositories.  He denies any chest pain or worsening shortness of breath and states that is baseline for him.  Denies any SI HI or AVHS      Physical Exam   Triage Vital Signs: ED Triage Vitals  Encounter Vitals Group     BP 10/16/23 1341 (!) 142/90     Girls Systolic BP Percentile --      Girls Diastolic BP Percentile --      Boys Systolic BP Percentile --      Boys Diastolic BP Percentile --      Pulse Rate 10/16/23 1341 (!) 105     Resp 10/16/23 1341 (!) 22     Temp 10/16/23 1341 98 F (36.7 C)     Temp Source 10/16/23 1341 Oral     SpO2 10/16/23 1341 100 %     Weight 10/16/23 1342 137 lb 7.3 oz (62.4 kg)     Height 10/16/23 1342 5' 10 (1.778 m)     Head Circumference --      Peak Flow --      Pain Score 10/16/23 1342 10     Pain Loc --      Pain Education --      Exclude from Growth Chart --     Most recent vital signs: Vitals:   10/16/23 1341  BP: (!) 142/90  Pulse: (!) 105  Resp: (!) 22  Temp: 98 F (36.7 C)  SpO2: 100%    Nursing Triage Note reviewed. Vital signs reviewed and patients oxygen saturation is normoxic on home O2  General: Patient is very thin, appeas  chronically ill  Head: Normocephalic and atraumatic Eyes: Normal inspection, extraocular muscles intact, no conjunctival pallor Ear, nose, throat: Normal external exam Neck: Normal range of motion Respiratory: Patient is in no respiratory distress, lungs with rhonchi Cardiovascular: Patient is tachycardic, RR without murmur appreciated GI: Abd soft, general tenderness to palpation with worsening in the left lower quadrant Back: Normal inspection of the back with good strength and range of motion throughout all ext Extremities: pulses intact with good cap refills, no LE pitting edema or calf tenderness Neuro: The patient is alert and oriented to person, place, and time, appropriately conversive, with 5/5 bilat UE/LE strength, no gross motor or sensory defects noted. Coordination appears to be adequate. Skin: Warm, dry, and intact Psych: normal mood and affect, no SI or HI  Rectal exam: Patient does have some bright red blood per rectum along anal verge, patient had just been able to push out some stool at the bathroom and I do not feel any stool ball afterwards  ED Results / Procedures / Treatments  Labs (all labs ordered are listed, but only abnormal results are displayed) Labs Reviewed  COMPREHENSIVE METABOLIC PANEL WITH GFR - Abnormal; Notable for the following components:      Result Value   Calcium 8.8 (*)    Albumin  2.5 (*)    All other components within normal limits  CBC - Abnormal; Notable for the following components:   RBC 3.47 (*)    Hemoglobin 8.7 (*)    HCT 29.0 (*)    MCH 25.1 (*)    RDW 18.3 (*)    Platelets 535 (*)    All other components within normal limits  LIPASE, BLOOD  URINALYSIS, ROUTINE W REFLEX MICROSCOPIC     EKG None  RADIOLOGY KUB: Profound constipation on my independent review interpretation, radiologist reads this as nonobstructive pattern    PROCEDURES:  Critical Care performed: No  Procedures   MEDICATIONS ORDERED IN  ED: Medications  sorbitol , magnesium  hydroxide, mineral oil, glycerin (SMOG) enema (960 mLs Rectal Given 10/16/23 1457)  lactulose  (CHRONULAC ) 10 GM/15ML solution 20 g (20 g Oral Given 10/16/23 1419)     IMPRESSION / MDM / ASSESSMENT AND PLAN / ED COURSE                                Differential diagnosis includes, but is not limited to, constipation, worsening malignancy, anemia, electrolyte derangement, less likely small bowel obstruction given recent imaging   ED course: Patient appears unwell but abdomen demonstrates no evidence of peritonitis.  I reviewed the CT chest abdomen pelvis completed just 2 days ago which demonstrated similar left hydropneumothorax that is loculated that has been present since 625, opacities in the lung, malignancy in the pancreatic tail and prominent stool throughout the colon that favors constipation.  He has no profound leukocytosis or electrolyte derangements.  He is amenable to a large dose of p.o. lactulose  and a smog enema.  Is unclear to me whether he requires admission or not for continued therapeutics but will reassess once the enema is completed.  Patient may need to be signed out to oncoming physician   Clinical Course as of 10/16/23 1517  Mon Oct 16, 2023  1428 WBC: 8.0 No leukocytosis [HD]  1428 Hemoglobin(!): 8.7 Anemia is at baseline [HD]  1428 Comprehensive metabolic panel(!) No AKI [HD]  1516 I was called into the room as patient only received 15% of the enema and nursing felt that it was hitting a blockade.  Patient sitting on the toilet when I entered the room.  He was able to push out some stool.  When I reexamined him I did not palpate any recurrent stool balls.  He already had some bright red blood from the anal verge.  [HD]  1516 Patient signed out to oncoming physician pending remainder of the smog enema and reassessment [HD]    Clinical Course User Index [HD] Nicholaus Rolland BRAVO, MD     FINAL CLINICAL IMPRESSION(S) / ED DIAGNOSES    Final diagnoses:  Constipation, unspecified constipation type  Abdominal pain, unspecified abdominal location  Malignancy (HCC)  Chronic respiratory failure, unspecified whether with hypoxia or hypercapnia (HCC)     Rx / DC Orders   ED Discharge Orders     None        Note:  This document was prepared using Dragon voice recognition software and may include unintentional dictation errors.   Nicholaus Rolland BRAVO, MD 10/16/23 [phone]

## 2023-10-16 NOTE — ED Notes (Signed)
 This RN to patient's room several times to see if patient feels the urge to have a bowel movement. Patient continues to deny any urgency at this time.

## 2023-10-17 ENCOUNTER — Encounter: Payer: Self-pay | Admitting: Oncology

## 2023-10-18 ENCOUNTER — Inpatient Hospital Stay: Attending: Oncology

## 2023-10-18 ENCOUNTER — Inpatient Hospital Stay

## 2023-10-18 VITALS — BP 114/72 | HR 100 | Temp 97.6°F | Resp 21

## 2023-10-18 DIAGNOSIS — I119 Hypertensive heart disease without heart failure: Secondary | ICD-10-CM | POA: Insufficient documentation

## 2023-10-18 DIAGNOSIS — N281 Cyst of kidney, acquired: Secondary | ICD-10-CM | POA: Insufficient documentation

## 2023-10-18 DIAGNOSIS — K5900 Constipation, unspecified: Secondary | ICD-10-CM | POA: Insufficient documentation

## 2023-10-18 DIAGNOSIS — C782 Secondary malignant neoplasm of pleura: Secondary | ICD-10-CM | POA: Insufficient documentation

## 2023-10-18 DIAGNOSIS — J90 Pleural effusion, not elsewhere classified: Secondary | ICD-10-CM | POA: Diagnosis not present

## 2023-10-18 DIAGNOSIS — C3432 Malignant neoplasm of lower lobe, left bronchus or lung: Secondary | ICD-10-CM | POA: Insufficient documentation

## 2023-10-18 DIAGNOSIS — Z5111 Encounter for antineoplastic chemotherapy: Secondary | ICD-10-CM | POA: Insufficient documentation

## 2023-10-18 DIAGNOSIS — I700 Atherosclerosis of aorta: Secondary | ICD-10-CM | POA: Insufficient documentation

## 2023-10-18 DIAGNOSIS — E785 Hyperlipidemia, unspecified: Secondary | ICD-10-CM | POA: Insufficient documentation

## 2023-10-18 DIAGNOSIS — Z87891 Personal history of nicotine dependence: Secondary | ICD-10-CM | POA: Insufficient documentation

## 2023-10-18 DIAGNOSIS — Z79899 Other long term (current) drug therapy: Secondary | ICD-10-CM | POA: Diagnosis not present

## 2023-10-18 LAB — CBC WITH DIFFERENTIAL (CANCER CENTER ONLY)
Abs Immature Granulocytes: 0.03 K/uL (ref 0.00–0.07)
Basophils Absolute: 0 K/uL (ref 0.0–0.1)
Basophils Relative: 0 %
Eosinophils Absolute: 0 K/uL (ref 0.0–0.5)
Eosinophils Relative: 0 %
HCT: 27 % — ABNORMAL LOW (ref 39.0–52.0)
Hemoglobin: 8 g/dL — ABNORMAL LOW (ref 13.0–17.0)
Immature Granulocytes: 1 %
Lymphocytes Relative: 12 %
Lymphs Abs: 0.5 K/uL — ABNORMAL LOW (ref 0.7–4.0)
MCH: 24.8 pg — ABNORMAL LOW (ref 26.0–34.0)
MCHC: 29.6 g/dL — ABNORMAL LOW (ref 30.0–36.0)
MCV: 83.9 fL (ref 80.0–100.0)
Monocytes Absolute: 0.8 K/uL (ref 0.1–1.0)
Monocytes Relative: 20 %
Neutro Abs: 2.7 K/uL (ref 1.7–7.7)
Neutrophils Relative %: 67 %
Platelet Count: 387 K/uL (ref 150–400)
RBC: 3.22 MIL/uL — ABNORMAL LOW (ref 4.22–5.81)
RDW: 18.3 % — ABNORMAL HIGH (ref 11.5–15.5)
WBC Count: 4.1 K/uL (ref 4.0–10.5)
nRBC: 0 % (ref 0.0–0.2)

## 2023-10-18 LAB — CMP (CANCER CENTER ONLY)
ALT: 18 U/L (ref 0–44)
AST: 21 U/L (ref 15–41)
Albumin: 2.3 g/dL — ABNORMAL LOW (ref 3.5–5.0)
Alkaline Phosphatase: 53 U/L (ref 38–126)
Anion gap: 7 (ref 5–15)
BUN: 16 mg/dL (ref 8–23)
CO2: 26 mmol/L (ref 22–32)
Calcium: 8.6 mg/dL — ABNORMAL LOW (ref 8.9–10.3)
Chloride: 103 mmol/L (ref 98–111)
Creatinine: 0.77 mg/dL (ref 0.61–1.24)
GFR, Estimated: >60 mL/min (ref >60)
Glucose, Bld: 151 mg/dL — ABNORMAL HIGH (ref 70–99)
Potassium: 3.3 mmol/L — ABNORMAL LOW (ref 3.5–5.1)
Sodium: 136 mmol/L (ref 135–145)
Total Bilirubin: 0.5 mg/dL (ref 0.0–1.2)
Total Protein: 6.8 g/dL (ref 6.5–8.1)

## 2023-10-18 MED ORDER — PROCHLORPERAZINE MALEATE 10 MG PO TABS
10.0000 mg | ORAL_TABLET | Freq: Once | ORAL | Status: AC
  Administered 2023-10-18: 10 mg via ORAL
  Filled 2023-10-18: qty 1

## 2023-10-18 MED ORDER — SODIUM CHLORIDE 0.9 % IV SOLN
800.0000 mg/m2 | Freq: Once | INTRAVENOUS | Status: AC
  Administered 2023-10-18: 1482 mg via INTRAVENOUS
  Filled 2023-10-18: qty 38.98

## 2023-10-18 MED ORDER — SODIUM CHLORIDE 0.9 % IV SOLN
INTRAVENOUS | Status: DC
Start: 2023-10-18 — End: 2023-10-18
  Filled 2023-10-18: qty 250

## 2023-10-18 NOTE — Patient Instructions (Signed)
 [facility] - A DEPT OF [facility]  Discharge Instructions: Thank you for choosing [facility] to provide your oncology and hematology care.  If you have a lab appointment with the Cancer Center, please go directly to the Cancer Center and check in at the registration area.  Wear comfortable clothing and clothing appropriate for easy access to any Portacath or PICC line.   We strive to give you quality time with your provider. You may need to reschedule your appointment if you arrive late (15 or more minutes).  Arriving late affects you and other patients whose appointments are after yours.  Also, if you miss three or more appointments without notifying the office, you may be dismissed from the clinic at the provider's discretion.      For prescription refill requests, have your pharmacy contact our office and allow 72 hours for refills to be completed.    Today you received the following chemotherapy and/or immunotherapy agents Gemzar      To help prevent nausea and vomiting after your treatment, we encourage you to take your nausea medication as directed.  BELOW ARE SYMPTOMS THAT SHOULD BE REPORTED IMMEDIATELY: *FEVER GREATER THAN 100.4 F (38 C) OR HIGHER *CHILLS OR SWEATING *NAUSEA AND VOMITING THAT IS NOT CONTROLLED WITH YOUR NAUSEA MEDICATION *UNUSUAL SHORTNESS OF BREATH *UNUSUAL BRUISING OR BLEEDING *URINARY PROBLEMS (pain or burning when urinating, or frequent urination) *BOWEL PROBLEMS (unusual diarrhea, constipation, pain near the anus) TENDERNESS IN MOUTH AND THROAT WITH OR WITHOUT PRESENCE OF ULCERS (sore throat, sores in mouth, or a toothache) UNUSUAL RASH, SWELLING OR PAIN  UNUSUAL VAGINAL DISCHARGE OR ITCHING   Items with * indicate a potential emergency and should be followed up as soon as possible or go to the Emergency Department if any problems should occur.  Please show the CHEMOTHERAPY ALERT CARD or IMMUNOTHERAPY ALERT  CARD at check-in to the Emergency Department and triage nurse.  Should you have questions after your visit or need to cancel or reschedule your appointment, please contact [facility] - A DEPT OF [facility]  [phone] and follow the prompts.  Office hours are 8:00 a.m. to 4:30 p.m. Monday - Friday. Please note that voicemails left after 4:00 p.m. may not be returned until the following business day.  We are closed weekends and major holidays. You have access to a nurse at all times for urgent questions. Please call the main number to the clinic [phone] and follow the prompts.  For any non-urgent questions, you may also contact your provider using MyChart. We now offer e-Visits for anyone 60 and older to request care online for non-urgent symptoms. For details visit [email or web address].   Also download the MyChart app! Go to the app store, search "MyChart", open the app, select [place], and log in with your MyChart username and password.

## 2023-10-23 ENCOUNTER — Encounter: Payer: Self-pay | Admitting: Oncology

## 2023-10-24 ENCOUNTER — Ambulatory Visit (INDEPENDENT_AMBULATORY_CARE_PROVIDER_SITE_OTHER): Admitting: Pulmonary Disease

## 2023-10-24 ENCOUNTER — Encounter: Payer: Self-pay | Admitting: Pulmonary Disease

## 2023-10-24 VITALS — BP 138/80 | HR 99 | Temp 97.1°F | Ht 70.0 in | Wt 137.0 lb

## 2023-10-24 DIAGNOSIS — F17210 Nicotine dependence, cigarettes, uncomplicated: Secondary | ICD-10-CM

## 2023-10-24 DIAGNOSIS — C3490 Malignant neoplasm of unspecified part of unspecified bronchus or lung: Secondary | ICD-10-CM | POA: Diagnosis not present

## 2023-10-24 DIAGNOSIS — J90 Pleural effusion, not elsewhere classified: Secondary | ICD-10-CM

## 2023-10-24 DIAGNOSIS — J910 Malignant pleural effusion: Secondary | ICD-10-CM

## 2023-10-24 DIAGNOSIS — Z85118 Personal history of other malignant neoplasm of bronchus and lung: Secondary | ICD-10-CM

## 2023-10-24 NOTE — Progress Notes (Unsigned)
 Synopsis: Referred in by [facility], [facility]   Subjective:   PATIENT ID: Mitchell Frye GENDER: male DOB: 02/03/1951, MRN: [id number]  Chief Complaint  Patient presents with   Consult    Pleural Effusion. ER on 9/1. SOB. No wheezing. Occasional cough.    HPI Mitchell Frye is a 73 year old male patient with a past medical history of heavy tobacco use, stage IIIb non-small cell lung cancer status post chemoradiation followed by durvalumab  course complicated by recurrence in 2023 status post chemoradiation presenting today to the pulmonary clinic for follow-up on pleural effusion.  He had recurrent left-sided pleural effusion status post multiple thoracentesis with cytology negative every time.  He has been recommended VATS but this has been delayed up until 3 weeks ago where he underwent VATS with left decortication and pericardial window.  Pleural pathology with fibrin fibrinous pleuritis, pericardium with mild fibrosis and chronic inflammation.    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   He is feeling significantly better in terms of breathing since his surgery.   US  in office of the left hemithorax without any signs of pleural effusion.   ROS All systems were reviewed and are negative except for the above.  Objective:   Vitals:   10/24/23 1338  BP: 138/80  Pulse: 99  Temp: (!) 97.1 F (36.2 C)  SpO2: 100%  Weight: 137 lb (62.1 kg)  Height: 5' 10 (1.778 m)   100% on RA BMI Readings from Last 3 Encounters:  10/24/23 19.66 kg/m  10/16/23 19.72 kg/m  10/11/23 19.72 kg/m   Wt Readings from Last 3 Encounters:  10/24/23 137 lb (62.1 kg)  10/16/23 137 lb 7.3 oz (62.4 kg)  10/11/23 137 lb 7.3 oz (62.4 kg)    Physical Exam GEN: NAD, Healthy Appearing HEENT: Supple Neck, Reactive Pupils, EOMI  CVS: Normal S1, Normal S2, RRR, No murmurs or ES appreciated  Lungs: Diminished at the left base. No  wheezing appreciated.  Abdomen: Soft, non tender, non distended, + BS  Extremities: Warm and well perfused, No edema  Skin: No suspicious lesions appreciated  Psych: Normal Affect  Ancillary Information   CBC    Component Value Date/Time   WBC 4.1 10/18/2023 1011   WBC 8.0 10/16/2023 1400   RBC 3.22 (L) 10/18/2023 1011   HGB 8.0 (L) 10/18/2023 1011   HCT 27.0 (L) 10/18/2023 1011   PLT 387 10/18/2023 1011   MCV 83.9 10/18/2023 1011   MCH 24.8 (L) 10/18/2023 1011   MCHC 29.6 (L) 10/18/2023 1011   RDW 18.3 (H) 10/18/2023 1011   LYMPHSABS 0.5 (L) 10/18/2023 1011   MONOABS 0.8 10/18/2023 1011   EOSABS 0.0 10/18/2023 1011   BASOSABS 0.0 10/18/2023 1011        No data to display         PFTs in 2022 with nonspecific ventilatory defect, decreaesd PEFR and gas trapping.   Assessment & Plan:  Mitchell Frye is a 73 year old male patient with a past medical history of heavy tobacco use, stage IIIb non-small cell lung cancer status post chemoradiation followed by durvalumab  course complicated by recurrence in 2023 status post chemoradiation presenting today to the pulmonary clinic for follow-up on pleural effusion.  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   #Clinical diagnosis of COPD with poor performance on  PFTs in 2022 that showed severe ventilatory defect with gas trapping.   []  Patient should be on LABA-LAMA at the minimum but reportedly was told not  to use inhalers during his last hospitalization. Will confirm with thoracic surgery prior to starting maintenance inhaler.  []  Will obtain repeat PFTs.  []  Will refer to pulmonary rehab as well.   No follow-ups on file.  I spent 60 minutes caring for this patient today, including preparing to see the patient, obtaining a medical history , reviewing a separately obtained history,  performing a medically appropriate examination and/or evaluation, counseling and educating the patient/family/caregiver, ordering medications, tests, or procedures, documenting clinical information in the electronic health record, and independently interpreting results (not separately reported/billed) and communicating results to the patient/family/caregiver  Darrin Barn, MD [facility] 10/24/2023 2:02 PM

## 2023-10-26 ENCOUNTER — Emergency Department

## 2023-10-26 ENCOUNTER — Emergency Department
Admission: EM | Admit: 2023-10-26 | Discharge: 2023-10-26 | Disposition: A | Discharge status: Home / Self Care | Attending: Emergency Medicine | Admitting: Emergency Medicine

## 2023-10-26 ENCOUNTER — Encounter: Payer: Self-pay | Admitting: Oncology

## 2023-10-26 ENCOUNTER — Other Ambulatory Visit: Payer: Self-pay

## 2023-10-26 DIAGNOSIS — I10 Essential (primary) hypertension: Secondary | ICD-10-CM | POA: Insufficient documentation

## 2023-10-26 DIAGNOSIS — K5900 Constipation, unspecified: Secondary | ICD-10-CM | POA: Insufficient documentation

## 2023-10-26 MED ORDER — SMOG ENEMA
960.0000 mL | Freq: Once | RECTAL | Status: AC
  Administered 2023-10-26: 960 mL via RECTAL
  Filled 2023-10-26: qty 960

## 2023-10-26 MED ORDER — SIMETHICONE 40 MG/0.6ML PO SUSP (UNIT DOSE)
40.0000 mg | Freq: Once | ORAL | Status: AC
  Administered 2023-10-26: 40 mg via ORAL
  Filled 2023-10-26: qty 30
  Filled 2023-10-26: qty 0.6

## 2023-10-26 MED ORDER — POLYETHYLENE GLYCOL 3350 17 G PO PACK
17.0000 g | PACK | Freq: Every day | ORAL | Status: DC
  Administered 2023-10-26: 17 g via ORAL
  Filled 2023-10-26: qty 1

## 2023-10-26 NOTE — ED Provider Notes (Signed)
 [facility] Emergency Department Provider Note     Event Date/Time   First MD Initiated Contact with Patient 10/26/23 1006     (approximate)   History   Constipation   HPI  Mitchell Frye is a 73 y.o. male with a history of HTN, HLD, dyspnea, chronic constipation, presents to the ED endorsing constipation.  Patient would endorse 1 week of symptoms.  He was last seen in this ED on 2 separate occasions last month for the same complaints.  No reports of any fevers, chills, or sweats.  No nausea, vomiting, or diarrhea reported.  Physical Exam   Triage Vital Signs: ED Triage Vitals  Encounter Vitals Group     BP 10/26/23 0944 131/84     Girls Systolic BP Percentile --      Girls Diastolic BP Percentile --      Boys Systolic BP Percentile --      Boys Diastolic BP Percentile --      Pulse Rate 10/26/23 0944 (!) 110     Resp 10/26/23 0944 20     Temp 10/26/23 0944 97.8 F (36.6 C)     Temp Source 10/26/23 0944 Oral     SpO2 10/26/23 0944 96 %     Weight 10/26/23 0947 137 lb (62.1 kg)     Height 10/26/23 0947 5' 10 (1.778 m)     Head Circumference --      Peak Flow --      Pain Score 10/26/23 0945 8     Pain Loc --      Pain Education --      Exclude from Growth Chart --     Most recent vital signs: Vitals:   10/26/23 0944 10/26/23 1337  BP: 131/84 133/84  Pulse: (!) 110 94  Resp: 20 18  Temp: 97.8 F (36.6 C) 98 F (36.7 C)  SpO2: 96% 97%    General Awake, no distress. NAD HEENT NCAT. PERRL. EOMI. No rhinorrhea. Mucous membranes are moist.  CV:  Good peripheral perfusion.  RESP:  Normal effort.  ABD:  No distention.  Soft and nontender.  No rebound, guarding, or rigidity noted.  Rectal exam reveals a vault with soft stool noted.  There is some scant bright red blood noted on the stool which was manually disimpacted.   ED Results / Procedures / Treatments   Labs (all labs ordered are listed, but only abnormal results are  displayed) Labs Reviewed - No data to display   EKG    RADIOLOGY  I personally viewed and evaluated these images as part of my medical decision making, as well as reviewing the written report by the radiologist.  ED Provider Interpretation: Moderate stool burden without evidence of SBO  DG Abdomen 1 View Result Date: 10/26/2023 CLINICAL DATA:  Constipation abdominal pain for approximately 1 week. EXAM: ABDOMEN - 1 VIEW COMPARISON:  October 16, 2023. FINDINGS: No abnormal bowel dilatation is noted. Moderate amount of stool seen throughout the colon. IMPRESSION: Moderate stool burden. No abnormal bowel dilatation. Electronically Signed   By: Lynwood Landy Raddle M.D.   On: 10/26/2023 10:40     PROCEDURES:  Critical Care performed: No  Procedures   MEDICATIONS ORDERED IN ED: Medications  polyethylene glycol (MIRALAX  / GLYCOLAX ) packet 17 g (17 g Oral Given by Other 10/26/23 1153)  simethicone  (MYLICON) 40 mg/0.73ml suspension 40 mg (40 mg Oral Given 10/26/23 1142)  sorbitol , magnesium  hydroxide, mineral oil, glycerin (SMOG) enema (960 mLs  Rectal Given 10/26/23 1154)     IMPRESSION / MDM / ASSESSMENT AND PLAN / ED COURSE  I reviewed the triage vital signs and the nursing notes.                              Differential diagnosis includes, but is not limited to, acute appendicitis, renal colic, testicular torsion, urinary tract infection/pyelonephritis, prostatitis,  epididymitis, diverticulitis, small bowel obstruction or ileus, colitis, abdominal aortic aneurysm, gastroenteritis, hernia, etc.  Patient's presentation is most consistent with acute complicated illness / injury requiring diagnostic workup.  Patient's diagnosis is consistent with constipation and rectal impaction.  Patient treated in the ED with a manual disimpaction as well as an SMOG enema.  Patient verbalizing significant improvement of his rectal fullness after rectal impaction as well as spontaneous stool.  Patient  will be discharged home with instructions to increase his fiber intake as well as fluid intake.  Patient should consider MiraLAX  3 times daily to allow for an appropriate bowel cleanout. Patient is to follow up with his PCP as discussed, as needed or otherwise directed. Patient is given ED precautions to return to the ED for any worsening or new symptoms.  FINAL CLINICAL IMPRESSION(S) / ED DIAGNOSES   Final diagnoses:  Constipation, unspecified constipation type     Rx / DC Orders   ED Discharge Orders     None        Note:  This document was prepared using Dragon voice recognition software and may include unintentional dictation errors.    Loyd Candida LULLA Aldona, PA-C 10/26/23 1443    Ernest Ronal BRAVO, MD 10/29/23 1324

## 2023-10-26 NOTE — Discharge Instructions (Addendum)
 Consider taking MiraLAX  2-3 times daily to help promote normal stools.  Increase your fiber intake daily including fresh fruits and veggies as discussed.  Drink plenty of fluids overall to prevent dehydration, and decrease your intake of meat and starches.  Follow-up with your primary provider for ongoing evaluation.  Return to the ED if needed.

## 2023-10-26 NOTE — ED Triage Notes (Signed)
 Pt to ED via POV for c/o constipation and abd pain that has been ongoing for 5-6 days. Pt recently seen here on 8/22 and 9/1 for the same.

## 2023-10-31 ENCOUNTER — Other Ambulatory Visit: Payer: Self-pay | Admitting: Hospice and Palliative Medicine

## 2023-11-01 ENCOUNTER — Inpatient Hospital Stay (HOSPITAL_BASED_OUTPATIENT_CLINIC_OR_DEPARTMENT_OTHER): Admitting: Oncology

## 2023-11-01 ENCOUNTER — Encounter: Payer: Self-pay | Admitting: Oncology

## 2023-11-01 ENCOUNTER — Encounter: Payer: Self-pay | Admitting: Hospice and Palliative Medicine

## 2023-11-01 ENCOUNTER — Inpatient Hospital Stay

## 2023-11-01 ENCOUNTER — Inpatient Hospital Stay (HOSPITAL_BASED_OUTPATIENT_CLINIC_OR_DEPARTMENT_OTHER): Admitting: Hospice and Palliative Medicine

## 2023-11-01 ENCOUNTER — Other Ambulatory Visit: Payer: Self-pay

## 2023-11-01 VITALS — BP 132/88 | HR 104 | Temp 97.9°F | Resp 24 | Ht 70.0 in | Wt 142.0 lb

## 2023-11-01 VITALS — BP 132/88 | HR 104 | Temp 97.9°F | Resp 24

## 2023-11-01 DIAGNOSIS — Z515 Encounter for palliative care: Secondary | ICD-10-CM

## 2023-11-01 DIAGNOSIS — C3432 Malignant neoplasm of lower lobe, left bronchus or lung: Secondary | ICD-10-CM

## 2023-11-01 DIAGNOSIS — Z7189 Other specified counseling: Secondary | ICD-10-CM | POA: Diagnosis not present

## 2023-11-01 MED ORDER — LACTULOSE 10 GM/15ML PO SOLN: 10.0000 g | Freq: Two times a day (BID) | ORAL | 0 refills | Status: DC | PRN

## 2023-11-01 MED ORDER — OXYCODONE HCL 5 MG PO TABS: 5.0000 mg | ORAL_TABLET | Freq: Four times a day (QID) | ORAL | 0 refills | Status: DC | PRN

## 2023-11-01 MED ORDER — LORAZEPAM 0.5 MG PO TABS: 0.5000 mg | ORAL_TABLET | Freq: Three times a day (TID) | ORAL | 0 refills | Status: DC | PRN

## 2023-11-01 MED ORDER — MORPHINE SULFATE ER 15 MG PO TBCR: 15.0000 mg | EXTENDED_RELEASE_TABLET | Freq: Two times a day (BID) | ORAL | 0 refills | Status: DC

## 2023-11-01 NOTE — Progress Notes (Signed)
 Palliative Medicine [facility]  [phone] [phone]   Name: Mitchell Frye Date: 11/01/2023 MRN: [id number]  DOB: May 23, 1950  Patient Care Team: Center, [facility] as PCP - General Anner, Alm ORN, MD as PCP - Cardiology (Cardiology) Verdene Gills, RN as Oncology Nurse Navigator Melanee Annah BROCKS, MD as Consulting Physician (Oncology)    REASON FOR CONSULTATION: Mitchell Frye is a 73 y.o. 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  He was referred to palliative care to help address goals and manage ongoing symptoms.  SOCIAL HISTORY:     reports that he quit smoking about 31 years ago. His smoking use included cigarettes. He started smoking about 51 years ago. He has a 20 pack-year smoking history. He has never used smokeless tobacco. He reports that he does not currently use alcohol. He reports that he does not use drugs.  Patient lives at home with his sister and two other brothers. His sister is his primary caregiver.  He does not drive.  ADVANCE DIRECTIVES:  Does not have  CODE STATUS: DNR/DNI (DNR order signed on 11/01/2023)  PAST MEDICAL HISTORY: 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 (HCC)     PAST SURGICAL HISTORY:  Past Surgical History:  Procedure Laterality Date   CHEST TUBE INSERTION Right 04/25/2023   Procedure: RIGHT PLEURAL CHEST TUBE INSERTION;  Surgeon: Kerrin Elspeth BROCKS, MD;  Location: [facility] OR;  Service: Thoracic;  Laterality: Right;   ESOPHAGOGASTRODUODENOSCOPY (EGD) WITH PROPOFOL  N/A 12/01/2020   Procedure: ESOPHAGOGASTRODUODENOSCOPY (EGD) WITH PROPOFOL ;  Surgeon: Unk Corinn Skiff, MD;  Location: [facility] ENDOSCOPY;  Service: Gastroenterology;  Laterality: N/A;   IR  THORACENTESIS ASP PLEURAL SPACE W/IMG GUIDE  12/26/2022   PERICARDIAL FLUID DRAINAGE N/A 04/25/2023   Procedure: DRAINAGE, PERICARDIAL EFFUSION;  Surgeon: Kerrin Elspeth BROCKS, MD;  Location: [facility] OR;  Service: Thoracic;  Laterality: N/A;   PERICARDIAL WINDOW N/A 04/25/2023   Procedure: CREATION, PERICARDIAL WINDOW;  Surgeon: Kerrin Elspeth BROCKS, MD;  Location: [facility] OR;  Service: Thoracic;  Laterality: N/A;   PLEURAL EFFUSION DRAINAGE Bilateral 04/25/2023   Procedure: DRAINAGE, PLEURAL EFFUSION;  Surgeon: Kerrin Elspeth BROCKS, MD;  Location: [facility] OR;  Service: Thoracic;  Laterality: Bilateral;   PORTA CATH INSERTION N/A 10/22/2019   Procedure: PORTA CATH INSERTION;  Surgeon: Jama Cordella MATSU, MD;  Location: [facility] INVASIVE CV LAB;  Service: Cardiovascular;  Laterality: N/A;   VIDEO ASSISTED THORACOSCOPY (VATS)/DECORTICATION Left 04/25/2023   Procedure: VIDEO ASSISTED THORACOSCOPY (VATS)/DECORTICATION;  Surgeon: Kerrin Elspeth BROCKS, MD;  Location: [facility] OR;  Service: Thoracic;  Laterality: Left;   VIDEO BRONCHOSCOPY WITH ENDOBRONCHIAL ULTRASOUND N/A 09/25/2019   Procedure: VIDEO BRONCHOSCOPY WITH ENDOBRONCHIAL ULTRASOUND;  Surgeon: Tamea Dedra CROME, MD;  Location: [facility] ORS;  Service: Pulmonary;  Laterality: N/A;    HEMATOLOGY/ONCOLOGY HISTORY:  Oncology History  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)  10/04/2019 Initial Diagnosis   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)   10/04/2019 Cancer Staging   Staging form: Lung, AJCC 8th Edition - Clinical stage from 10/04/2019: Stage IIIB (cT2b, cN3, cM0) - Signed by Melanee Annah BROCKS, MD on 10/04/2019   10/28/2019 - 11/18/2019 Chemotherapy   The patient had dexamethasone  (DECADRON ) 4 MG tablet, 8 mg, Oral, Daily, 1 of 1  cycle, Start date: 10/04/2019, End date: -- palonosetron  (ALOXI ) injection 0.25 mg, 0.25 mg, Intravenous,  Once, 4 of 4 cycles Administration: 0.25 mg (10/28/2019), 0.25 mg (11/04/2019), 0.25 mg (11/11/2019), 0.25 mg  (11/18/2019) PACLitaxel -protein bound (ABRAXANE ) chemo infusion 200 mg, 100 mg/m2 = 200 mg (100 % of original dose 100 mg/m2), Intravenous,  Once, 4 of 4 cycles Dose modification: 100 mg/m2 (original dose 100 mg/m2, Cycle 2), 80 mg/m2 (original dose 100 mg/m2, Cycle 3, Reason: Other (see comments), Comment: neutropenia), 100 mg/m2 (original dose 100 mg/m2, Cycle 1) Administration: 200 mg (11/04/2019), 175 mg (11/11/2019), 200 mg (10/29/2019), 175 mg (11/18/2019) CARBOplatin  (PARAPLATIN ) 210 mg in sodium chloride  0.9 % 250 mL chemo infusion, 210 mg (100 % of original dose 214.6 mg), Intravenous,  Once, 4 of 4 cycles Dose modification:   (original dose 214.6 mg, Cycle 1) Administration: 210 mg (10/28/2019), 210 mg (11/04/2019), 210 mg (11/11/2019), 210 mg (11/18/2019) PACLitaxel  (TAXOL ) 90 mg in sodium chloride  0.9 % 250 mL chemo infusion (</= 80mg /m2), 45 mg/m2 = 90 mg, Intravenous,  Once, 1 of 1 cycle Administration: 90 mg (10/28/2019)  for chemotherapy treatment.    11/25/2019 - 11/25/2019 Chemotherapy   The patient had palonosetron  (ALOXI ) injection 0.25 mg, 0.25 mg, Intravenous,  Once, 1 of 1 cycle Administration: 0.25 mg (11/25/2019) PEMEtrexed  (ALIMTA ) 1,000 mg in sodium chloride  0.9 % 100 mL chemo infusion, 500 mg/m2 = 1,000 mg, Intravenous,  Once, 1 of 1 cycle Administration: 1,000 mg (11/25/2019) CARBOplatin  (PARAPLATIN ) 550 mg in sodium chloride  0.9 % 250 mL chemo infusion, 550 mg (100 % of original dose 548 mg), Intravenous,  Once, 1 of 1 cycle Dose modification:   (original dose 548 mg, Cycle 1) Administration: 550 mg (11/25/2019) fosaprepitant  (EMEND) 150 mg in sodium chloride  0.9 % 145 mL IVPB, 150 mg, Intravenous,  Once, 1 of 1 cycle Administration: 150 mg (11/25/2019)  for chemotherapy treatment.    01/14/2020 - 11/13/2020 Chemotherapy   Patient is on Treatment Plan : LUNG Durvalumab  q14d     12/11/2020 - 10/01/2021 Chemotherapy   Patient is on Treatment Plan : LUNG NSCLC Pemetrexed  +  Carboplatin  q21d x 4 Cycles     10/22/2021 - 01/14/2022 Chemotherapy   Patient is on Treatment Plan : LUNG Pemetrexed  (500) q21d      05/24/2023 -  Chemotherapy   Patient is on Treatment Plan : LUNG Gemcitabine  (1000) D1,8 q21d       ALLERGIES:  is allergic to taxol  [paclitaxel ].  MEDICATIONS:  Current Outpatient Medications  Medication Sig Dispense Refill   acetaminophen  (TYLENOL ) 500 MG tablet Take 500 mg by mouth every 6 (six) hours as needed for mild pain (pain score 1-3) (takes as needed for generalized pain.  Denies pain at this time.).     albuterol  (VENTOLIN  HFA) 108 (90 Base) MCG/ACT inhaler Inhale 2 puffs into the lungs every 4 (four) hours as needed for wheezing or shortness of breath. 6.7 g 2   ascorbic acid  (VITAMIN C ) 500 MG tablet Take 1 tablet (500 mg total) by mouth daily.     feeding supplement (ENSURE PLUS HIGH PROTEIN) LIQD Take 237 mLs by mouth 3 (three) times daily between meals.     iron  polysaccharides (NIFEREX) 150 MG capsule Take 1 capsule (150 mg total) by mouth daily. 30 capsule 0   lactulose  (CHRONULAC ) 10 GM/15ML solution Take 15-30 mLs (10-20 g total) by mouth 2 (two) times daily as needed for mild constipation. 236 mL 0   LORazepam  (ATIVAN ) 0.5 MG tablet Take  1 tablet (0.5 mg total) by mouth every 8 (eight) hours as needed for anxiety. 30 tablet 0   megestrol  (MEGACE ) 40 MG tablet Take 1 tablet (40 mg total) by mouth daily. 30 tablet 2   morphine  (MS CONTIN ) 15 MG 12 hr tablet Take 1 tablet (15 mg total) by mouth every 12 (twelve) hours. 30 tablet 0   Multiple Vitamin (MULTIVITAMIN WITH MINERALS) TABS tablet Take 1 tablet by mouth daily.     naloxone  (NARCAN ) nasal spray 4 mg/0.1 mL SPRAY 1 SPRAY INTO ONE NOSTRIL AS DIRECTED FOR OPIOID OVERDOSE (TURN PERSON ON SIDE AFTER DOSE. IF NO RESPONSE IN 2-3 MINUTES OR PERSON RESPONDS BUT RELAPSES, REPEAT USING A NEW SPRAY DEVICE AND SPRAY INTO THE OTHER NOSTRIL. CALL 911 AFTER USE.) * EMERGENCY USE ONLY * 1 each 0    ondansetron  (ZOFRAN ) 8 MG tablet Take 1 tablet (8 mg total) by mouth every 8 (eight) hours as needed for nausea or vomiting. 30 tablet 1   oxyCODONE  (OXY IR/ROXICODONE ) 5 MG immediate release tablet Take 1 tablet (5 mg total) by mouth every 6 (six) hours as needed. 120 tablet 0   pantoprazole  (PROTONIX ) 20 MG tablet Take 1 tablet (20 mg total) by mouth every morning. 30 tablet 2   prochlorperazine  (COMPAZINE ) 10 MG tablet Take 1 tablet (10 mg total) by mouth every 6 (six) hours as needed for nausea or vomiting. 30 tablet 1   umeclidinium-vilanterol (ANORO ELLIPTA ) 62.5-25 MCG/ACT AEPB Inhale 1 puff into the lungs daily. 1 each 3   Vitamin D , Ergocalciferol , (DRISDOL ) 1.25 MG (50000 UNIT) CAPS capsule Take 1 capsule (50,000 Units total) by mouth every 7 (seven) days. 5 capsule 0   No current facility-administered medications for this visit.    VITAL SIGNS: There were no vitals taken for this visit. There were no vitals filed for this visit.  Estimated body mass index is 19.66 kg/m as calculated from the following:   Height as of 10/26/23: 5' 10 (1.778 m).   Weight as of 10/26/23: 137 lb (62.1 kg).  LABS: CBC:    Component Value Date/Time   WBC 4.1 10/18/2023 1011   WBC 8.0 10/16/2023 1400   HGB 8.0 (L) 10/18/2023 1011   HCT 27.0 (L) 10/18/2023 1011   PLT 387 10/18/2023 1011   MCV 83.9 10/18/2023 1011   NEUTROABS 2.7 10/18/2023 1011   LYMPHSABS 0.5 (L) 10/18/2023 1011   MONOABS 0.8 10/18/2023 1011   EOSABS 0.0 10/18/2023 1011   BASOSABS 0.0 10/18/2023 1011   Comprehensive Metabolic Panel:    Component Value Date/Time   NA 136 10/18/2023 1011   K 3.3 (L) 10/18/2023 1011   CL 103 10/18/2023 1011   CO2 26 10/18/2023 1011   BUN 16 10/18/2023 1011   CREATININE 0.77 10/18/2023 1011   GLUCOSE 151 (H) 10/18/2023 1011   CALCIUM 8.6 (L) 10/18/2023 1011   AST 21 10/18/2023 1011   ALT 18 10/18/2023 1011   ALKPHOS 53 10/18/2023 1011   BILITOT 0.5 10/18/2023 1011   PROT 6.8 10/18/2023  1011   ALBUMIN  2.3 (L) 10/18/2023 1011    RADIOGRAPHIC STUDIES: DG Abdomen 1 View Result Date: 10/26/2023 CLINICAL DATA:  Constipation abdominal pain for approximately 1 week. EXAM: ABDOMEN - 1 VIEW COMPARISON:  October 16, 2023. FINDINGS: No abnormal bowel dilatation is noted. Moderate amount of stool seen throughout the colon. IMPRESSION: Moderate stool burden. No abnormal bowel dilatation. Electronically Signed   By: Lynwood Landy Raddle M.D.   On: 10/26/2023 10:40  DG Abdomen 1 View Result Date: 10/16/2023 EXAM: 1 VIEW XRAY OF THE ABDOMEN 10/16/2023 02:10:00 PM COMPARISON: None available. CLINICAL HISTORY: Abdominal pain with constipation. FINDINGS: BOWEL: Mild retained gas and stool throughout the large bowel. No disproportionately dilated small bowel loops. No evidence of pneumatosis or pneumoperitoneum. SOFT TISSUES: No opaque urinary calculi. BONES: No acute osseous abnormality. PLEURAL SPACES: Pleural effusions at bilateral lung bases. IMPRESSION: 1. Nonobstructive bowel gas pattern. 2. Pleural effusions at bilateral lung bases. Electronically signed by: Selinda Blue MD 10/16/2023 02:47 PM EDT RP Workstation: [id number]   CT CHEST ABDOMEN PELVIS W CONTRAST Result Date: 10/13/2023 CLINICAL DATA:  Left lower lobe lung cancer restaging. Shortness of breath. * Tracking Code: BO * EXAM: CT CHEST, ABDOMEN, AND PELVIS WITH CONTRAST TECHNIQUE: Multidetector CT imaging of the chest, abdomen and pelvis was performed following the standard protocol during bolus administration of intravenous contrast. RADIATION DOSE REDUCTION: This exam was performed according to the departmental dose-optimization program which includes automated exposure control, adjustment of the mA and/or kV according to patient size and/or use of iterative reconstruction technique. CONTRAST:  80mL OMNIPAQUE  IOHEXOL  300 MG/ML  SOLN COMPARISON:  08/09/2023 and CT chest 10/04/2023 FINDINGS: Despite efforts by the technologist and patient, motion  artifact is present on today's exam and could not be eliminated. This reduces exam sensitivity and specificity. CT CHEST FINDINGS Cardiovascular: Right Port-A-Cath tip: Cavoatrial junction. Atheromatous vascular calcification of the aortic arch. Mediastinum/Nodes: Unremarkable Lungs/Pleura: Similar loculated left hydropneumothorax compared to 08/09/2023. Mildly reduced right pleural effusion compared to 08/09/2023 although probably with loculated components laterally. Emphysema. Passive atelectasis bilaterally. Stable 6 mm perifissural nodule in the right middle lobe along the minor fissure, image 87 series 3. The peribronchovascular nodule medially in the right lower lobe is obscured by motion artifact on image 101 of series 3 but appears grossly stable from 08/09/2023. Bandlike airspace opacity in the right lower lobe for example on image 86 series 3 appears stable from 10/04/2023. Musculoskeletal: Unremarkable CT ABDOMEN PELVIS FINDINGS Hepatobiliary: Unremarkable Pancreas: 1.3 cm cystic lesion of the pancreatic tail, previously 1.1 cm on 12/17/2019 and previously 1.3 cm on 08/09/2023. Possibilities include postinflammatory cystic lesion or intraductal papillary mucinous neoplasm. Spleen: Unremarkable Adrenals/Urinary Tract: Stable bilateral renal cysts. Adrenal glands unremarkable. Urinary bladder unremarkable. Stomach/Bowel: Prominent stool throughout the colon favors constipation. Vascular/Lymphatic: Atherosclerosis is present, including aortoiliac atherosclerotic disease. Reproductive: Retracted right testicle. Other: Small amount of free pelvic fluid. Musculoskeletal: Unremarkable IMPRESSION: 1. Similar loculated left hydropneumothorax compared to 08/09/2023. 2. Mildly reduced right pleural effusion compared to 08/09/2023 although probably with loculated components laterally. 3. Stable 6 mm perifissural nodule in the right middle lobe along the minor fissure. The peribronchovascular nodule medially in the right  lower lobe is obscured by motion artifact but appears grossly stable from 08/09/2023. 4. Bandlike airspace opacity in the right lower lobe appears stable from 10/04/2023. 5. 1.3 cm cystic lesion of the pancreatic tail, previously 1.1 cm on 12/17/2019. Possibilities include postinflammatory cystic lesion or intraductal papillary mucinous neoplasm. This can be surveilled in the context of the patient's anticipated follow up imaging. 6. Prominent stool throughout the colon favors constipation. 7. Small amount of free pelvic fluid. 8.  Aortic Atherosclerosis (ICD10-I70.0). Electronically Signed   By: Ryan Salvage M.D.   On: 10/13/2023 14:38   DG Chest Port 1 View Result Date: 10/06/2023 CLINICAL DATA:  [id number]. Chest pain. Follow-up left hydropneumothorax para EXAM: PORTABLE CHEST 1 VIEW COMPARISON:  Portable chest earlier today at 4:32 p.m. FINDINGS: 10:40 p.m. Loculated  hydropneumothorax again noted in the lateral left mid to lower chest, with underlying consolidation change left mid perihilar area and left base. Small layering right pleural effusion with overlying patchy atelectasis or airspace disease right base. The more cephalad lungs are clear. Overall aeration is unchanged. Cardiac size is normal. Right IJ port catheter again terminates in the upper right atrium. The mediastinum is stable. Slight dextroscoliosis with no new osseous findings. Aortic atherosclerosis. IMPRESSION: 1. No significant change in the loculated hydropneumothorax in the lateral left mid to lower chest, with underlying consolidation change left mid perihilar area and left base. 2. Small layering right pleural effusion with overlying patchy atelectasis or airspace disease right base. Electronically Signed   By: Francis Quam M.D.   On: 10/06/2023 22:57   DG Chest Port 1 View Result Date: 10/06/2023 CLINICAL DATA:  [id number] Dyspnea [id number] EXAM: PORTABLE CHEST - 1 VIEW COMPARISON:  October 06, 2023 7:06 a.m.; October 05, 2023, October 04, 2023 FINDINGS: Unchanged small right pleural effusion, likely loculated. Similar appearance of the loculated pleural gas on the left. Patchy opacities in perihilar left lung, likely atelectasis. Layering effusion is also likely present on the left. No cardiomegaly. Right chest port terminates at the cavoatrial junction. Multilevel thoracic osteophytosis. IMPRESSION: 1. Similar amount of loculated pleural gas on the left with perihilar atelectasis in the left lung. 2. Small loculated right pleural effusion with right basilar atelectasis, also unchanged. Electronically Signed   By: Rogelia Myers M.D.   On: 10/06/2023 17:09   DG Chest Port 1 View Result Date: 10/06/2023 CLINICAL DATA:  Left chest tube withdrawal EXAM: PORTABLE CHEST 1 VIEW COMPARISON:  10/05/2023 FINDINGS: Pigtail thoracostomy tube on the left as been removed. The amount of pleural air lateral to the left mid lung and lower lung does not appear increased. Associated volume loss in the left perihilar region and lower lobe is stable. Right pleural effusion and lower lung volume loss is stable. Port-A-Cath again seen. No new or worsening finding otherwise. IMPRESSION: Removal of the left thoracostomy tube. No increase in the amount of pleural air on the left. Stable volume loss in the left perihilar region and lower lobe. Stable right pleural effusion and lower lung volume loss. Electronically Signed   By: Oneil Officer M.D.   On: 10/06/2023 10:27   DG Chest Port 1 View Result Date: 10/05/2023 EXAM: 1 VIEW XRAY OF THE CHEST 10/05/2023 08:40:56 AM COMPARISON: AP radiograph of the chest dated 10/03/2023. CLINICAL HISTORY: Pleural effusion. FINDINGS: LUNGS AND PLEURA: Bilateral pleural effusions are present. Irregularly reticular opacities are also present within the mid and lower lung zones. There is likely loculated pneumothorax present laterally within the left chest cavity. HEART AND MEDIASTINUM: No acute abnormality of the cardiac and mediastinal  silhouettes. BONES AND SOFT TISSUES: No acute osseous abnormality. A pigtail drainage catheter is noted at the left lung base. A right internal jugular chest port is also again demonstrated. IMPRESSION: 1. Bilateral pleural effusions. 2. Likely loculated pneumothorax in the left chest cavity. 3. Irregular reticular opacities in the mid and lower lung zones. 4. Pigtail drainage catheter at the left lung base and right internal jugular chest port. Electronically signed by: Evalene Coho MD 10/05/2023 08:45 AM EDT RP Workstation: [id number]   CT CHEST WO CONTRAST Result Date: 10/04/2023 CLINICAL DATA:  Pleural effusion, history of lung cancer * Tracking Code: BO * EXAM: CT CHEST WITHOUT CONTRAST TECHNIQUE: Multidetector CT imaging of the chest was performed following the standard protocol without IV  contrast. RADIATION DOSE REDUCTION: This exam was performed according to the departmental dose-optimization program which includes automated exposure control, adjustment of the mA and/or kV according to patient size and/or use of iterative reconstruction technique. COMPARISON:  10/01/2023 FINDINGS: Cardiovascular: Right Port-A-Cath tip: Cavoatrial junction. Mild aortic and coronary atherosclerotic vascular calcification. Mild cardiomegaly. Mediastinum/Nodes: Low-grade background stranding in the mediastinal adipose tissue potentially from third spacing of fluids. Trace pericardial effusion. Gas-filled upper thoracic esophagus. Lungs/Pleura: Centrilobular emphysema. Moderate to large left hydropneumothorax with a left pleural drainage catheter within frothy material along the posterior lung base, cannot exclude empyema given the complexity. There is some passive atelectasis in the left upper lobe and left lower lobe. Stable 6 mm right middle lobe subpleural nodule along the minor fissure. Mildly increased peripheral airspace opacity laterally in the right lower lobe for example on image 99 series 4. Peripheral  atelectasis in the right lower lobe is partially passive due to a small to moderate right pleural effusion. Left perihilar consolidation is again observed. Upper Abdomen: Unremarkable Musculoskeletal: Unremarkable IMPRESSION: 1. Moderate to large left hydropneumothorax with a left pleural drainage catheter within frothy material along the posterior lung base, cannot exclude empyema given the complexity. 2. Small to moderate right pleural effusion with passive atelectasis in the right lower lobe. 3. Mildly increased peripheral airspace opacity laterally in the right lower lobe, potentially from pneumonia. 4. Stable 6 mm right middle lobe subpleural nodule along the minor fissure. 5. Mild cardiomegaly. 6. Low-grade background stranding in the mediastinal adipose tissue potentially from third spacing of fluids. 7. Stable left perihilar consolidation. 8. Aortic Atherosclerosis (ICD10-I70.0) and Emphysema (ICD10-J43.9). Electronically Signed   By: Ryan Salvage M.D.   On: 10/04/2023 13:50   DG Chest Port 1 View Result Date: 10/03/2023 CLINICAL DATA:  Pleural effusion. EXAM: PORTABLE CHEST 1 VIEW COMPARISON:  October 01, 2023. FINDINGS: Stable cardiomediastinal silhouette. Right internal jugular Port-A-Cath is unchanged. Interval placement of pigtail catheter in the left lung base. Left pleural effusion is significantly smaller. Probable ex vacuo pneumothorax is noted laterally. Stable small right pleural effusion with associated atelectasis. Bony thorax is unremarkable. IMPRESSION: Interval placement of pigtail catheter in left lung base. Left pleural effusion is significantly smaller. Probable ex vacuo pneumothorax is noted laterally on the left. Electronically Signed   By: Lynwood Landy Raddle M.D.   On: 10/03/2023 12:01   CT [facility] PLEURAL DRAIN W/INDWELL CATH W/IMG GUIDE Result Date: 10/02/2023 INDICATION: 73 year old male with complex left pleural effusion/empyema. He presents for placement of a chest tube. EXAM:  CT-guided chest tube placement TECHNIQUE: Multidetector CT imaging of the chest was performed following the standard protocol without IV contrast. RADIATION DOSE REDUCTION: This exam was performed according to the departmental dose-optimization program which includes automated exposure control, adjustment of the mA and/or kV according to patient size and/or use of iterative reconstruction technique. MEDICATIONS: The patient is currently admitted to the hospital and receiving intravenous antibiotics. The antibiotics were administered within an appropriate time frame prior to the initiation of the procedure. ANESTHESIA/SEDATION: Moderate (conscious) sedation was employed during this procedure. A total of Versed  1 mg and Fentanyl  50 mcg was administered intravenously by the radiology nurse. Total intra-service moderate Sedation Time: 13 minutes. The patient's level of consciousness and vital signs were monitored continuously by radiology nursing throughout the procedure under my direct supervision. COMPLICATIONS: None immediate. PROCEDURE: Informed written consent was obtained from the patient after a thorough discussion of the procedural risks, benefits and alternatives. All questions were addressed. Maximal  Sterile Barrier Technique was utilized including caps, mask, sterile gowns, sterile gloves, sterile drape, hand hygiene and skin antiseptic. A timeout was performed prior to the initiation of the procedure. A planning axial CT scan was performed. The loculated left pleural effusion was identified. A suitable skin entry site was selected and marked. Local anesthesia was attained by infiltration with 1% lidocaine . A small dermatotomy was made. Under intermittent CT guidance, an 18 gauge trocar needle was advanced over a rib and into the fluid collection. A 0.035 wire was then coiled in the fluid collection. The percutaneous tract was dilated to 14 [place]. A Cook 14 [place] all-purpose drainage catheter modified with  additional sideholes was advanced over the wire and formed. Aspiration yields turbid bloody fluid. Samples were sent for Gram stain and culture. The tube was then connected to low wall suction via a pleura vac atrium. The catheter was secured to the skin with 0 Prolene suture. Bandages were applied. IMPRESSION: Successful placement of 14 French chest tube into the left complex pleural effusion. Electronically Signed   By: Wilkie Lent M.D.   On: 10/02/2023 15:50    PERFORMANCE STATUS (ECOG) : 1  Review of Systems Unless otherwise noted, a complete review of systems is negative.  Physical Exam General: NAD HEENT: white patches on tongue Pulmonary: Unlabored Extremities: no edema, no joint deformities Skin: no rashes Neurological: Weakness but otherwise nonfocal  IMPRESSION: Follow-up visit.  Patient accompanied by his brother.  Patient's sister dissipated in the visit via phone.  Patient recently presented to the ER with constipation.  Required disimpaction.  Patient Dors is constipation again.  His sister is managing his medications and is not consistently giving him lactulose .  Recommended consistent dosing with target of a bowel movement daily.  Pain and anxiety are also poorly controlled.  However, again there does not appear to be consistent dosing of medication.  Reviewed pain regimen in detail and recommended MS Contin  twice daily.  Lorazepam  reportedly effective when patient takes it.  Refill sent today.  Patient with poor performance status.  He met today with Dr. Helena who recommended discontinuation of chemotherapy.  Long conversation with patient and family regarding overall goals.  Patient verbalized an understanding that his presumed prognosis was likely limited to months.  However, he says he feels that he will likely outlive this prognosis.  Patient and family were ultimately in agreement with hospice involvement.  Will coordinate referral.  I had a long conversation  regarding CODE STATUS and the probable futility associated with resuscitation in the setting of terminal cancer.  Ultimately, patient said that he did not want anyone beating on me and agreed with DNR/DNI.  DNR order signed for patient to take home.   PLAN: -Best supportive care -Referral to hospice -Continue MS Contin /oxycodone .  Refill sent -Continue lorazepam .  Refill sent -Lactulose  as needed for constipation -PDMP reviewed -DNR/DNI -Telephone visit 3 to 4 weeks  Case and plan discussed with Dr. Melanee  Patient expressed understanding and was in agreement with this plan. He also understands that He can call the clinic at any time with any questions, concerns, or complaints.     Time Total: 60 minutes  Visit consisted of counseling and education dealing with the complex and emotionally intense issues of symptom management and palliative care in the setting of serious and potentially life-threatening illness.Greater than 50%  of this time was spent counseling and coordinating care related to the above assessment and plan.  Signed by: Fonda Mower, PhD,  NP-C

## 2023-11-02 ENCOUNTER — Other Ambulatory Visit: Payer: Self-pay | Admitting: Hospice and Palliative Medicine

## 2023-11-04 ENCOUNTER — Encounter: Payer: Self-pay | Admitting: Oncology

## 2023-11-04 NOTE — Progress Notes (Signed)
 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  No evidence of distant metastatic disease.  Pathology was consistent with non-small cell lung cancer favor adenocarcinoma.  Cells were positive for TTF-1 and negative for p40.   He received 1 cycle of CarboTaxol and then had significant reaction to Taxol .  He was switched to Abraxane  for 3 more cycles but due to shortage of Abraxane  he was switched to carbo Alimta  for 2 cycles ending in October 2021 concurrent with radiation.  Subsequently he was started on maintenance durvalumab  which she continued until September 2022.     CT scans in October 2022 showed concern for hilar recurrence and patient was retreated with carbo Alimta  followed by maintenance Alimta  which she received until December 2023.  He has been off treatment since then due to stable disease but Alimta  was also complicated by worsening anemia and AKI.   Skains while he was on Alimta  showed mild to moderate left pleural effusion.  He has had multiple thoracentesis in the past which were all negative for malignancy. PET scan in October 2024 did not show any evidence of  recurrent or progressive disease elsewhere but showed large left pleural effusion with complete left lower lobe collapse.  Also noted to have small to moderate pericardial effusion on his CT scans.    He was admitted to [facility] and underwentVATS procedure with decortication and drainage of pleural and pericardial effusion.  Cytology from thoracentesis as well as pericardial fluid was negative for malignancy.  Pericardial biopsy showed nonspecific chronic inflammation.  However his visceral pleural biopsy showed scant foci of non-small cell carcinoma only within lymphovascular space.   NGS testing back in 2021 showed no evidence of actionable mutations.  Tumor mutational burden 0.  MSI stable PD-L1 10%  Interval history-patient continues to do presently poorly.  He has chronic fatigue and his exertional shortness of breath is getting worse.  He reports ongoing chest wall pain.  He finds it hard to carry home oxygen around  ECOG PS- 3 Pain scale- 4   Review of systems- Review of Systems  Constitutional:  Positive for malaise/fatigue and weight loss. Negative for chills and fever.       Lack of appetite  HENT:  Negative for congestion, ear discharge and nosebleeds.   Eyes:  Negative for blurred vision.  Respiratory:  Positive for shortness of breath. Negative for cough, hemoptysis, sputum production and wheezing.   Cardiovascular:  Negative for chest pain, palpitations, orthopnea and claudication.  Gastrointestinal:  Negative for abdominal pain, blood in stool, constipation, diarrhea, heartburn, melena, nausea and vomiting.  Genitourinary:  Negative for dysuria, flank pain, frequency, hematuria and urgency.  Musculoskeletal:  Negative for back pain, joint pain and myalgias.  Skin:  Negative  for rash.  Neurological:  Negative for dizziness, tingling, focal weakness, seizures, weakness and headaches.  Endo/Heme/Allergies:  Does not bruise/bleed easily.  Psychiatric/Behavioral:  Negative for  depression and suicidal ideas. The patient does not have insomnia.       Allergies  Allergen Reactions   Taxol  [Paclitaxel ] Other (See Comments)    Chest pain, hip pain, coughing , wheezing     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 [facility])      Past Surgical History:  Procedure Laterality Date   CHEST TUBE INSERTION Right 04/25/2023   Procedure: RIGHT PLEURAL CHEST TUBE INSERTION;  Surgeon: Kerrin Elspeth BROCKS, MD;  Location: [facility] OR;  Service: Thoracic;  Laterality: Right;   ESOPHAGOGASTRODUODENOSCOPY (EGD) WITH PROPOFOL  N/A 12/01/2020   Procedure: ESOPHAGOGASTRODUODENOSCOPY (EGD) WITH PROPOFOL ;  Surgeon: Unk Corinn Skiff, MD;  Location: [facility] ENDOSCOPY;  Service: Gastroenterology;  Laterality: N/A;   IR THORACENTESIS ASP PLEURAL SPACE W/IMG GUIDE  12/26/2022   PERICARDIAL FLUID DRAINAGE N/A 04/25/2023   Procedure: DRAINAGE, PERICARDIAL EFFUSION;  Surgeon: Kerrin Elspeth BROCKS, MD;  Location: [facility] OR;  Service: Thoracic;  Laterality: N/A;   PERICARDIAL WINDOW N/A 04/25/2023   Procedure: CREATION, PERICARDIAL WINDOW;  Surgeon: Kerrin Elspeth BROCKS, MD;  Location: [facility] OR;  Service: Thoracic;  Laterality: N/A;   PLEURAL EFFUSION DRAINAGE Bilateral 04/25/2023   Procedure: DRAINAGE, PLEURAL EFFUSION;  Surgeon: Kerrin Elspeth BROCKS, MD;  Location: [facility] OR;  Service: Thoracic;  Laterality: Bilateral;   PORTA CATH INSERTION N/A 10/22/2019   Procedure: PORTA CATH INSERTION;  Surgeon: Jama Cordella MATSU, MD;  Location: [facility] INVASIVE CV LAB;  Service: Cardiovascular;  Laterality: N/A;   VIDEO ASSISTED THORACOSCOPY (VATS)/DECORTICATION Left 04/25/2023   Procedure: VIDEO ASSISTED THORACOSCOPY (VATS)/DECORTICATION;  Surgeon: Kerrin Elspeth BROCKS, MD;  Location: [facility] OR;  Service: Thoracic;  Laterality: Left;   VIDEO BRONCHOSCOPY WITH ENDOBRONCHIAL ULTRASOUND N/A 09/25/2019   Procedure: VIDEO  BRONCHOSCOPY WITH ENDOBRONCHIAL ULTRASOUND;  Surgeon: Tamea Dedra CROME, MD;  Location: [facility] ORS;  Service: Pulmonary;  Laterality: N/A;    Social History   Socioeconomic History   Marital status: Single    Spouse name: Not on file   Number of children: Not on file   Years of education: Not on file   Highest education level: Not on file  Occupational History   Not on file  Tobacco Use   Smoking status: Former    Current packs/day: 0.00    Average packs/day: 1 pack/day for 20.0 years (20.0 ttl pk-yrs)    Types: Cigarettes    Start date: 68    Quit date: 39    Years since quitting: 31.7   Smokeless tobacco: Never  Vaping Use   Vaping status: Never Used  Substance and Sexual Activity   Alcohol use: Not Currently    Comment: not drank any beer in 2 onths   Drug use: Never   Sexual activity: Not Currently  Other Topics Concern   Not on file  Social History Narrative   Not on file   Social Drivers of Health   Financial Resource Strain: Not on file  Food Insecurity: No Food Insecurity (10/01/2023)   Hunger Vital Sign    Worried About Running Out of Food in the Last Year: Never true    Ran Out of Food in the Last Year: Never true  Transportation Needs: No Transportation Needs (10/01/2023)   PRAPARE -  Administrator, Civil Service (Medical): No    Lack of Transportation (Non-Medical): No  Physical Activity: Not on file  Stress: Not on file  Social Connections: Socially Isolated (10/01/2023)   Social Connection and Isolation Panel    Frequency of Communication with Friends and Family: More than three times a week    Frequency of Social Gatherings with Friends and Family: More than three times a week    Attends Religious Services: Never    Database administrator or Organizations: No    Attends Banker Meetings: Never    Marital Status: Never married  Intimate Partner Violence: Not At Risk (10/01/2023)   Humiliation, Afraid, Rape, and Kick  questionnaire    Fear of Current or Ex-Partner: No    Emotionally Abused: No    Physically Abused: No    Sexually Abused: No    Family History  Problem Relation Age of Onset   Cancer Brother      Current Outpatient Medications:    acetaminophen  (TYLENOL ) 500 MG tablet, Take 500 mg by mouth every 6 (six) hours as needed for mild pain (pain score 1-3) (takes as needed for generalized pain.  Denies pain at this time.)., Disp: , Rfl:    albuterol  (VENTOLIN  HFA) 108 (90 Base) MCG/ACT inhaler, Inhale 2 puffs into the lungs every 4 (four) hours as needed for wheezing or shortness of breath., Disp: 6.7 g, Rfl: 2   ascorbic acid  (VITAMIN C ) 500 MG tablet, Take 1 tablet (500 mg total) by mouth daily., Disp: , Rfl:    feeding supplement (ENSURE PLUS HIGH PROTEIN) LIQD, Take 237 mLs by mouth 3 (three) times daily between meals., Disp: , Rfl:    iron  polysaccharides (NIFEREX) 150 MG capsule, Take 1 capsule (150 mg total) by mouth daily., Disp: 30 capsule, Rfl: 0   lactulose  (CHRONULAC ) 10 GM/15ML solution, Take 15-30 mLs (10-20 g total) by mouth 2 (two) times daily as needed for mild constipation., Disp: 236 mL, Rfl: 0   LORazepam  (ATIVAN ) 0.5 MG tablet, Take 1 tablet (0.5 mg total) by mouth every 8 (eight) hours as needed for anxiety., Disp: 30 tablet, Rfl: 0   megestrol  (MEGACE ) 40 MG tablet, Take 1 tablet (40 mg total) by mouth daily. (Patient not taking: Reported on 11/01/2023), Disp: 30 tablet, Rfl: 2   morphine  (MS CONTIN ) 15 MG 12 hr tablet, Take 1 tablet (15 mg total) by mouth every 12 (twelve) hours., Disp: 60 tablet, Rfl: 0   Multiple Vitamin (MULTIVITAMIN WITH MINERALS) TABS tablet, Take 1 tablet by mouth daily., Disp: , Rfl:    naloxone  (NARCAN ) nasal spray 4 mg/0.1 mL, SPRAY 1 SPRAY INTO ONE NOSTRIL AS DIRECTED FOR OPIOID OVERDOSE (TURN PERSON ON SIDE AFTER DOSE. IF NO RESPONSE IN 2-3 MINUTES OR PERSON RESPONDS BUT RELAPSES, REPEAT USING A NEW SPRAY DEVICE AND SPRAY INTO THE OTHER NOSTRIL.  CALL 911 AFTER USE.) * EMERGENCY USE ONLY * (Patient not taking: Reported on 11/01/2023), Disp: 1 each, Rfl: 0   ondansetron  (ZOFRAN ) 8 MG tablet, Take 1 tablet (8 mg total) by mouth every 8 (eight) hours as needed for nausea or vomiting. (Patient not taking: Reported on 11/01/2023), Disp: 30 tablet, Rfl: 1   oxyCODONE  (OXY IR/ROXICODONE ) 5 MG immediate release tablet, Take 1 tablet (5 mg total) by mouth every 6 (six) hours as needed., Disp: 120 tablet, Rfl: 0   pantoprazole  (PROTONIX ) 20 MG tablet, Take 1 tablet (20 mg total) by mouth every morning., Disp: 30  tablet, Rfl: 2   prochlorperazine  (COMPAZINE ) 10 MG tablet, Take 1 tablet (10 mg total) by mouth every 6 (six) hours as needed for nausea or vomiting. (Patient not taking: Reported on 11/01/2023), Disp: 30 tablet, Rfl: 1   umeclidinium-vilanterol (ANORO ELLIPTA ) 62.5-25 MCG/ACT AEPB, Inhale 1 puff into the lungs daily., Disp: 1 each, Rfl: 3   Vitamin D , Ergocalciferol , (DRISDOL ) 1.25 MG (50000 UNIT) CAPS capsule, Take 1 capsule (50,000 Units total) by mouth every 7 (seven) days., Disp: 5 capsule, Rfl: 0  Physical exam:  Vitals:   11/01/23 1030 11/01/23 1039  BP: (!) 159/85 132/88  Pulse: (!) 109 (!) 104  Resp: (!) 24   Temp: 97.9 F (36.6 C)   TempSrc: Tympanic    Physical Exam Constitutional:      Comments: He is anxious and appears in distress.  He is on home oxygen  Cardiovascular:     Rate and Rhythm: Regular rhythm. Tachycardia present.     Heart sounds: Normal heart sounds.  Pulmonary:     Comments: Breath sounds decreased bilaterally at the bases Skin:    General: Skin is warm and dry.  Neurological:     Mental Status: He is alert and oriented to person, place, and time.      I have personally reviewed labs listed below:    Latest Ref Rng & Units 10/18/2023   10:11 AM  CMP  Glucose 70 - 99 mg/dL 848   BUN 8 - 23 mg/dL 16   Creatinine 9.38 - 1.24 mg/dL 9.22   Sodium 864 - 854 mmol/L 136   Potassium 3.5 - 5.1 mmol/L  3.3   Chloride 98 - 111 mmol/L 103   CO2 22 - 32 mmol/L 26   Calcium 8.9 - 10.3 mg/dL 8.6   Total Protein 6.5 - 8.1 g/dL 6.8   Total Bilirubin 0.0 - 1.2 mg/dL 0.5   Alkaline Phos 38 - 126 U/L 53   AST 15 - 41 U/L 21   ALT 0 - 44 U/L 18       Latest Ref Rng & Units 10/18/2023   10:11 AM  CBC  WBC 4.0 - 10.5 K/uL 4.1   Hemoglobin 13.0 - 17.0 g/dL 8.0   Hematocrit 60.9 - 52.0 % 27.0   Platelets 150 - 400 K/uL 387    I have personally reviewed Radiology images listed below: No images are attached to the encounter.  DG Abdomen 1 View Result Date: 10/26/2023 CLINICAL DATA:  Constipation abdominal pain for approximately 1 week. EXAM: ABDOMEN - 1 VIEW COMPARISON:  October 16, 2023. FINDINGS: No abnormal bowel dilatation is noted. Moderate amount of stool seen throughout the colon. IMPRESSION: Moderate stool burden. No abnormal bowel dilatation. Electronically Signed   By: Lynwood Landy Raddle M.D.   On: 10/26/2023 10:40   DG Abdomen 1 View Result Date: 10/16/2023 EXAM: 1 VIEW XRAY OF THE ABDOMEN 10/16/2023 02:10:00 PM COMPARISON: None available. CLINICAL HISTORY: Abdominal pain with constipation. FINDINGS: BOWEL: Mild retained gas and stool throughout the large bowel. No disproportionately dilated small bowel loops. No evidence of pneumatosis or pneumoperitoneum. SOFT TISSUES: No opaque urinary calculi. BONES: No acute osseous abnormality. PLEURAL SPACES: Pleural effusions at bilateral lung bases. IMPRESSION: 1. Nonobstructive bowel gas pattern. 2. Pleural effusions at bilateral lung bases. Electronically signed by: Selinda Blue MD 10/16/2023 02:47 PM EDT RP Workstation: [id number]   CT CHEST ABDOMEN PELVIS W CONTRAST Result Date: 10/13/2023 CLINICAL DATA:  Left lower lobe lung cancer restaging. Shortness of breath. *  Tracking Code: BO * EXAM: CT CHEST, ABDOMEN, AND PELVIS WITH CONTRAST TECHNIQUE: Multidetector CT imaging of the chest, abdomen and pelvis was performed following the standard protocol  during bolus administration of intravenous contrast. RADIATION DOSE REDUCTION: This exam was performed according to the departmental dose-optimization program which includes automated exposure control, adjustment of the mA and/or kV according to patient size and/or use of iterative reconstruction technique. CONTRAST:  80mL OMNIPAQUE  IOHEXOL  300 MG/ML  SOLN COMPARISON:  08/09/2023 and CT chest 10/04/2023 FINDINGS: Despite efforts by the technologist and patient, motion artifact is present on today's exam and could not be eliminated. This reduces exam sensitivity and specificity. CT CHEST FINDINGS Cardiovascular: Right Port-A-Cath tip: Cavoatrial junction. Atheromatous vascular calcification of the aortic arch. Mediastinum/Nodes: Unremarkable Lungs/Pleura: Similar loculated left hydropneumothorax compared to 08/09/2023. Mildly reduced right pleural effusion compared to 08/09/2023 although probably with loculated components laterally. Emphysema. Passive atelectasis bilaterally. Stable 6 mm perifissural nodule in the right middle lobe along the minor fissure, image 87 series 3. The peribronchovascular nodule medially in the right lower lobe is obscured by motion artifact on image 101 of series 3 but appears grossly stable from 08/09/2023. Bandlike airspace opacity in the right lower lobe for example on image 86 series 3 appears stable from 10/04/2023. Musculoskeletal: Unremarkable CT ABDOMEN PELVIS FINDINGS Hepatobiliary: Unremarkable Pancreas: 1.3 cm cystic lesion of the pancreatic tail, previously 1.1 cm on 12/17/2019 and previously 1.3 cm on 08/09/2023. Possibilities include postinflammatory cystic lesion or intraductal papillary mucinous neoplasm. Spleen: Unremarkable Adrenals/Urinary Tract: Stable bilateral renal cysts. Adrenal glands unremarkable. Urinary bladder unremarkable. Stomach/Bowel: Prominent stool throughout the colon favors constipation. Vascular/Lymphatic: Atherosclerosis is present, including aortoiliac  atherosclerotic disease. Reproductive: Retracted right testicle. Other: Small amount of free pelvic fluid. Musculoskeletal: Unremarkable IMPRESSION: 1. Similar loculated left hydropneumothorax compared to 08/09/2023. 2. Mildly reduced right pleural effusion compared to 08/09/2023 although probably with loculated components laterally. 3. Stable 6 mm perifissural nodule in the right middle lobe along the minor fissure. The peribronchovascular nodule medially in the right lower lobe is obscured by motion artifact but appears grossly stable from 08/09/2023. 4. Bandlike airspace opacity in the right lower lobe appears stable from 10/04/2023. 5. 1.3 cm cystic lesion of the pancreatic tail, previously 1.1 cm on 12/17/2019. Possibilities include postinflammatory cystic lesion or intraductal papillary mucinous neoplasm. This can be surveilled in the context of the patient's anticipated follow up imaging. 6. Prominent stool throughout the colon favors constipation. 7. Small amount of free pelvic fluid. 8.  Aortic Atherosclerosis (ICD10-I70.0). Electronically Signed   By: Ryan Salvage M.D.   On: 10/13/2023 14:38   DG Chest Port 1 View Result Date: 10/06/2023 CLINICAL DATA:  [id number]. Chest pain. Follow-up left hydropneumothorax para EXAM: PORTABLE CHEST 1 VIEW COMPARISON:  Portable chest earlier today at 4:32 p.m. FINDINGS: 10:40 p.m. Loculated hydropneumothorax again noted in the lateral left mid to lower chest, with underlying consolidation change left mid perihilar area and left base. Small layering right pleural effusion with overlying patchy atelectasis or airspace disease right base. The more cephalad lungs are clear. Overall aeration is unchanged. Cardiac size is normal. Right IJ port catheter again terminates in the upper right atrium. The mediastinum is stable. Slight dextroscoliosis with no new osseous findings. Aortic atherosclerosis. IMPRESSION: 1. No significant change in the loculated hydropneumothorax in the  lateral left mid to lower chest, with underlying consolidation change left mid perihilar area and left base. 2. Small layering right pleural effusion with overlying patchy atelectasis or airspace disease right base.  Electronically Signed   By: Francis Quam M.D.   On: 10/06/2023 22:57   DG Chest Port 1 View Result Date: 10/06/2023 CLINICAL DATA:  [id number] Dyspnea [id number] EXAM: PORTABLE CHEST - 1 VIEW COMPARISON:  October 06, 2023 7:06 a.m.; October 05, 2023, October 04, 2023 FINDINGS: Unchanged small right pleural effusion, likely loculated. Similar appearance of the loculated pleural gas on the left. Patchy opacities in perihilar left lung, likely atelectasis. Layering effusion is also likely present on the left. No cardiomegaly. Right chest port terminates at the cavoatrial junction. Multilevel thoracic osteophytosis. IMPRESSION: 1. Similar amount of loculated pleural gas on the left with perihilar atelectasis in the left lung. 2. Small loculated right pleural effusion with right basilar atelectasis, also unchanged. Electronically Signed   By: Rogelia Myers M.D.   On: 10/06/2023 17:09   DG Chest Port 1 View Result Date: 10/06/2023 CLINICAL DATA:  Left chest tube withdrawal EXAM: PORTABLE CHEST 1 VIEW COMPARISON:  10/05/2023 FINDINGS: Pigtail thoracostomy tube on the left as been removed. The amount of pleural air lateral to the left mid lung and lower lung does not appear increased. Associated volume loss in the left perihilar region and lower lobe is stable. Right pleural effusion and lower lung volume loss is stable. Port-A-Cath again seen. No new or worsening finding otherwise. IMPRESSION: Removal of the left thoracostomy tube. No increase in the amount of pleural air on the left. Stable volume loss in the left perihilar region and lower lobe. Stable right pleural effusion and lower lung volume loss. Electronically Signed   By: Oneil Officer M.D.   On: 10/06/2023 10:27     Assessment and plan- Patient is  a 73 y.o. male with history of recurrent right lung cancer here to discuss CT scan results and further management  Patient was found to have recurrent adenocarcinoma involving the lymphovascular bases when he underwent decortication procedure by thoracic surgery for recurrent left pleural effusion.  Despite that he continues to have bilateral pleural effusions and overall his quality of life is declined.  He has lost considerable amount of weight and continues to endorse fatigue and shortness of breath.Although his present CT scan from 10/13/2023 does not reveal any overt evidence of progression there continues to be a right pleural effusion and loculated left hydropneumothorax and patient likely has persistent disease in these areas.  Given his declining performance status I would not like to offer any further chemotherapy at this time.  He will be seen by palliative care today and we will transition him to home hospice.  I explained to the patient that focus presently would be on his quality of life to avoid any further hospitalizations.  Overall prognosis is poor likely 3 to 6 months.  Patient is accepting of hospice although remains optimistic about his overall outlook and survival   Visit Diagnosis 1. Goals of care, counseling/discussion   2. 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)      Dr. Annah Skene, MD, MPH [facility] [phone] 11/04/2023 2:20 PM

## 2023-11-08 ENCOUNTER — Inpatient Hospital Stay

## 2023-11-21 ENCOUNTER — Ambulatory Visit: Admitting: Pulmonary Disease

## 2023-11-22 ENCOUNTER — Other Ambulatory Visit: Payer: Self-pay | Admitting: Hospice and Palliative Medicine

## 2023-11-22 ENCOUNTER — Telehealth: Admitting: Hospice and Palliative Medicine

## 2023-11-23 ENCOUNTER — Encounter: Payer: Self-pay | Admitting: Oncology

## 2023-11-23 ENCOUNTER — Encounter: Payer: Self-pay | Admitting: Pulmonary Disease

## 2023-12-12 ENCOUNTER — Other Ambulatory Visit: Payer: Self-pay | Admitting: Oncology

## 2024-01-15 ENCOUNTER — Other Ambulatory Visit: Payer: Self-pay | Admitting: Hospice and Palliative Medicine

## 2024-01-15 MED ORDER — OXYCODONE HCL 5 MG PO TABS: 5.0000 mg | ORAL_TABLET | Freq: Four times a day (QID) | ORAL | 0 refills | Status: DC | PRN

## 2024-01-15 NOTE — Progress Notes (Signed)
 Spoke with hospice nurse, [facility]. Refill for oxycodone  requested. Rx sent.

## 2024-01-23 ENCOUNTER — Telehealth: Payer: Self-pay | Admitting: Pharmacy Technician

## 2024-01-23 ENCOUNTER — Encounter: Payer: Self-pay | Admitting: Oncology

## 2024-01-23 ENCOUNTER — Other Ambulatory Visit (HOSPITAL_COMMUNITY): Payer: Self-pay

## 2024-01-23 NOTE — Telephone Encounter (Signed)
 Oral Oncology Patient Advocate Encounter   New authorization   Received notification that prior authorization for Oxycodone  is required.   PA submitted on CMM via Latent Key [id number] Status is pending     Tameshia Bonneville (Patty) Chet Burnet, CPhT  [facility], Zelda Salmon, [facility] - Oral Chemotherapy Patient Advocate Specialist III Phone: [phone]  Fax: [phone]

## 2024-01-24 ENCOUNTER — Encounter: Payer: Self-pay | Admitting: Oncology

## 2024-01-24 NOTE — Telephone Encounter (Signed)
 Oral Oncology Patient Advocate Encounter  Prior Authorization for Oxycodone  has been approved.    PA# [id number] Effective dates: 01/23/2024 through 02/13/2025  Barbette (Patty) Chet Burnet, CPhT  [facility], [place], [place] Hematology/Oncology - Oral Chemotherapy Patient Advocate Specialist III Phone: [phone]  Fax: [phone]

## 2024-02-05 ENCOUNTER — Emergency Department

## 2024-02-05 ENCOUNTER — Observation Stay
Admission: EM | Admit: 2024-02-05 | Discharge: 2024-02-06 | Disposition: A | Discharge status: Hospice | Attending: Internal Medicine | Admitting: Internal Medicine

## 2024-02-05 ENCOUNTER — Other Ambulatory Visit: Payer: Self-pay

## 2024-02-05 DIAGNOSIS — R0602 Shortness of breath: Principal | ICD-10-CM | POA: Insufficient documentation

## 2024-02-05 DIAGNOSIS — G893 Neoplasm related pain (acute) (chronic): Secondary | ICD-10-CM | POA: Diagnosis not present

## 2024-02-05 DIAGNOSIS — Z79899 Other long term (current) drug therapy: Secondary | ICD-10-CM | POA: Diagnosis not present

## 2024-02-05 DIAGNOSIS — I5032 Chronic diastolic (congestive) heart failure: Secondary | ICD-10-CM | POA: Diagnosis not present

## 2024-02-05 DIAGNOSIS — C7802 Secondary malignant neoplasm of left lung: Secondary | ICD-10-CM | POA: Diagnosis not present

## 2024-02-05 DIAGNOSIS — E785 Hyperlipidemia, unspecified: Secondary | ICD-10-CM | POA: Insufficient documentation

## 2024-02-05 DIAGNOSIS — R0600 Dyspnea, unspecified: Secondary | ICD-10-CM | POA: Diagnosis not present

## 2024-02-05 DIAGNOSIS — E43 Unspecified severe protein-calorie malnutrition: Secondary | ICD-10-CM | POA: Insufficient documentation

## 2024-02-05 DIAGNOSIS — Z87891 Personal history of nicotine dependence: Secondary | ICD-10-CM | POA: Insufficient documentation

## 2024-02-05 DIAGNOSIS — J90 Pleural effusion, not elsewhere classified: Secondary | ICD-10-CM | POA: Diagnosis not present

## 2024-02-05 DIAGNOSIS — R531 Weakness: Secondary | ICD-10-CM | POA: Insufficient documentation

## 2024-02-05 DIAGNOSIS — E440 Moderate protein-calorie malnutrition: Secondary | ICD-10-CM | POA: Diagnosis present

## 2024-02-05 DIAGNOSIS — D509 Iron deficiency anemia, unspecified: Secondary | ICD-10-CM | POA: Diagnosis not present

## 2024-02-05 DIAGNOSIS — I110 Hypertensive heart disease with heart failure: Secondary | ICD-10-CM | POA: Diagnosis not present

## 2024-02-05 DIAGNOSIS — R4182 Altered mental status, unspecified: Secondary | ICD-10-CM

## 2024-02-05 DIAGNOSIS — C3490 Malignant neoplasm of unspecified part of unspecified bronchus or lung: Secondary | ICD-10-CM

## 2024-02-05 DIAGNOSIS — C7800 Secondary malignant neoplasm of unspecified lung: Secondary | ICD-10-CM | POA: Diagnosis present

## 2024-02-05 LAB — COMPREHENSIVE METABOLIC PANEL WITH GFR
ALT: <5 U/L (ref 0–44)
AST: 20 U/L (ref 15–41)
Albumin: 3.1 g/dL — ABNORMAL LOW (ref 3.5–5.0)
Alkaline Phosphatase: 65 U/L (ref 38–126)
Anion gap: 13 (ref 5–15)
BUN: 18 mg/dL (ref 8–23)
CO2: 26 mmol/L (ref 22–32)
Calcium: 9.5 mg/dL (ref 8.9–10.3)
Chloride: 97 mmol/L — ABNORMAL LOW (ref 98–111)
Creatinine, Ser: 0.97 mg/dL (ref 0.61–1.24)
GFR, Estimated: >60 mL/min
Glucose, Bld: 108 mg/dL — ABNORMAL HIGH (ref 70–99)
Potassium: 4.5 mmol/L (ref 3.5–5.1)
Sodium: 136 mmol/L (ref 135–145)
Total Bilirubin: 0.6 mg/dL (ref 0.0–1.2)
Total Protein: 8.2 g/dL — ABNORMAL HIGH (ref 6.5–8.1)

## 2024-02-05 LAB — URINALYSIS, W/ REFLEX TO CULTURE (INFECTION SUSPECTED)
Bilirubin Urine: NEGATIVE
Glucose, UA: NEGATIVE mg/dL
Ketones, ur: NEGATIVE mg/dL
Leukocytes,Ua: NEGATIVE
Nitrite: NEGATIVE
Protein, ur: NEGATIVE mg/dL
Specific Gravity, Urine: 1.026 (ref 1.005–1.030)
pH: 5 (ref 5.0–8.0)

## 2024-02-05 LAB — CBC WITH DIFFERENTIAL/PLATELET
Abs Immature Granulocytes: 0.04 K/uL (ref 0.00–0.07)
Basophils Absolute: 0 K/uL (ref 0.0–0.1)
Basophils Relative: 0 %
Eosinophils Absolute: 0 K/uL (ref 0.0–0.5)
Eosinophils Relative: 0 %
HCT: 35.3 % — ABNORMAL LOW (ref 39.0–52.0)
Hemoglobin: 9.8 g/dL — ABNORMAL LOW (ref 13.0–17.0)
Immature Granulocytes: 0 %
Lymphocytes Relative: 5 %
Lymphs Abs: 0.5 K/uL — ABNORMAL LOW (ref 0.7–4.0)
MCH: 20.1 pg — ABNORMAL LOW (ref 26.0–34.0)
MCHC: 27.8 g/dL — ABNORMAL LOW (ref 30.0–36.0)
MCV: 72.5 fL — ABNORMAL LOW (ref 80.0–100.0)
Monocytes Absolute: 0.6 K/uL (ref 0.1–1.0)
Monocytes Relative: 7 %
Neutro Abs: 7.9 K/uL — ABNORMAL HIGH (ref 1.7–7.7)
Neutrophils Relative %: 88 %
Platelets: 414 K/uL — ABNORMAL HIGH (ref 150–400)
RBC: 4.87 MIL/uL (ref 4.22–5.81)
RDW: 18.2 % — ABNORMAL HIGH (ref 11.5–15.5)
WBC: 9.1 K/uL (ref 4.0–10.5)
nRBC: 0 % (ref 0.0–0.2)

## 2024-02-05 LAB — RESP PANEL BY RT-PCR (RSV, FLU A&B, COVID)  RVPGX2
Influenza A by PCR: NEGATIVE
Influenza B by PCR: NEGATIVE
Resp Syncytial Virus by PCR: NEGATIVE
SARS Coronavirus 2 by RT PCR: NEGATIVE

## 2024-02-05 LAB — TROPONIN T, HIGH SENSITIVITY
Troponin T High Sensitivity: 19 ng/L (ref 0–19)
Troponin T High Sensitivity: 18 ng/L (ref 0–19)

## 2024-02-05 LAB — PRO BRAIN NATRIURETIC PEPTIDE: Pro Brain Natriuretic Peptide: 1741 pg/mL — ABNORMAL HIGH

## 2024-02-05 MED ORDER — PROCHLORPERAZINE EDISYLATE 10 MG/2ML IJ SOLN: 5.0000 mg | Freq: Four times a day (QID) | INTRAMUSCULAR | Status: DC | PRN

## 2024-02-05 MED ORDER — POLYETHYLENE GLYCOL 3350 17 G PO PACK: 17.0000 g | PACK | Freq: Every day | ORAL | Status: DC | PRN

## 2024-02-05 MED ORDER — ACETAMINOPHEN 325 MG PO TABS
650.0000 mg | ORAL_TABLET | Freq: Four times a day (QID) | ORAL | Status: DC | PRN
  Administered 2024-02-05 – 2024-02-06 (×2): 650 mg via ORAL
  Filled 2024-02-05 (×2): qty 2

## 2024-02-05 MED ORDER — IOHEXOL 350 MG/ML SOLN
75.0000 mL | Freq: Once | INTRAVENOUS | Status: AC | PRN
  Administered 2024-02-05: 75 mL via INTRAVENOUS

## 2024-02-05 MED ORDER — UMECLIDINIUM-VILANTEROL 62.5-25 MCG/ACT IN AEPB
1.0000 | INHALATION_SPRAY | Freq: Every day | RESPIRATORY_TRACT | Status: DC
  Administered 2024-02-06: 1 via RESPIRATORY_TRACT
  Filled 2024-02-05: qty 14

## 2024-02-05 MED ORDER — FUROSEMIDE 10 MG/ML IJ SOLN
20.0000 mg | Freq: Once | INTRAMUSCULAR | Status: AC
  Administered 2024-02-05: 20 mg via INTRAVENOUS
  Filled 2024-02-05: qty 4

## 2024-02-05 MED ORDER — MELATONIN 5 MG PO TABS
5.0000 mg | ORAL_TABLET | Freq: Every evening | ORAL | Status: DC | PRN
  Administered 2024-02-05: 5 mg via ORAL
  Filled 2024-02-05: qty 1

## 2024-02-05 MED ORDER — ENOXAPARIN SODIUM 40 MG/0.4ML IJ SOSY
40.0000 mg | PREFILLED_SYRINGE | INTRAMUSCULAR | Status: DC
  Administered 2024-02-05: 40 mg via SUBCUTANEOUS
  Filled 2024-02-05: qty 0.4

## 2024-02-05 NOTE — ED Provider Notes (Signed)
 Mitchell Frye Provider Note    Event Date/Time   First MD Initiated Contact with Patient 02/05/24 1607     (approximate)   History   Shortness of Breath   HPI  Mitchell Frye is a 73 y.o. 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  History is limited due to patient's mental status.  Per independent history from EMS, she is coming from home, home hospice nurse was there, states that they are unable to take him at home and unable to transition him to to inpatient hospice at this time and so wanted him to come to the emergency department for evaluation.  States that he is full code.  States that he has had altered mental status for a while and that is not new.  No history of dementia.  No report of trauma or falls.  On independent chart review, he was last evaluated by oncology in September, lives at home with sister and 2 brothers, sister is his primary caregiver, they had discussed his poor performance status, were recommended to discontinue chemotherapy, at that time he agreed to DNR DNI and transition to home hospice.  I called hospice, they said at at this time he is full code, this order for full CODE STATUS was signed after his meeting with oncology.       Physical Exam   Triage Vital Signs: ED Triage Vitals  Encounter Vitals Group     BP      Girls Systolic BP Percentile      Girls Diastolic BP Percentile      Boys Systolic BP Percentile      Boys Diastolic BP Percentile      Pulse      Resp      Temp      Temp src      SpO2      Weight      Height      Head Circumference      Peak Flow      Pain Score      Pain Loc      Pain Education      Exclude from Growth Chart     Most recent vital signs: Vitals:   02/05/24 1920 02/05/24 1930  BP: (!) 139/92 125/77  Pulse: 99 86  Resp: (!) 25 14  Temp:    SpO2: 100% 100%      General: Sleepy CV:  Good peripheral perfusion.  Resp:  Normal effort.  Clear, tachypneic Abd:  No distention.  Soft nontender Other:  Trace lower extremity edema that appears symmetrical   ED Results / Procedures / Treatments   Labs (all labs ordered are listed, but only abnormal results are displayed) Labs Reviewed  COMPREHENSIVE METABOLIC PANEL WITH GFR - Abnormal; Notable for the following components:      Result Value   Chloride 97 (*)    Glucose, Bld 108 (*)    Total Protein 8.2 (*)    Albumin  3.1 (*)    All other components within normal limits  CBC WITH DIFFERENTIAL/PLATELET - Abnormal; Notable for the following components:   Hemoglobin 9.8 (*)    HCT 35.3 (*)    MCV 72.5 (*)    MCH 20.1 (*)    MCHC 27.8 (*)    RDW 18.2 (*)    Platelets 414 (*)    Neutro Abs 7.9 (*)  Lymphs Abs 0.5 (*)    All other components within normal limits  PRO BRAIN NATRIURETIC PEPTIDE - Abnormal; Notable for the following components:   Pro Brain Natriuretic Peptide 1,741.0 (*)    All other components within normal limits  RESP PANEL BY RT-PCR (RSV, FLU A&B, COVID)  [id number]  URINALYSIS, W/ REFLEX TO CULTURE (INFECTION SUSPECTED)  TROPONIN T, HIGH SENSITIVITY  TROPONIN T, HIGH SENSITIVITY     EKG  EKG shows, sinus tachycardia, rate 103, normal QS, normal QTc, no obvious ischemic ST elevation, baseline wander due to patient movement, there is a lot of artifact, not significantly changed compared to prior   RADIOLOGY On my independent interpretation, chest x-ray shows left loculated hydropneumothorax   PROCEDURES:  Critical Care performed: No  Procedures   MEDICATIONS ORDERED IN ED: Medications  furosemide  (LASIX ) injection 20 mg (has no administration in time range)  iohexol  (OMNIPAQUE ) 350 MG/ML injection 75 mL (75 mLs Intravenous Contrast Given 02/05/24 1835)     IMPRESSION / MDM / ASSESSMENT AND PLAN / ED COURSE  I reviewed the triage vital signs and the  nursing notes.                              Differential diagnosis includes, but is not limited to, recurrent pleural effusions, PE, pneumonia, viral illness.  For the altered mental status, patient reports this is not new, patient is on oxycodone  and Ativan  as well as morphine , could be polypharmacy versus  Patient's presentation is most consistent with acute presentation with potential threat to life or bodily function.  Independent interpretation of labs and imaging below.  Patient's BNP is elevated, did show small pleural effusion and left hydropneumothorax, will give him a small dose of Lasix  here.  He will need to be admitted for further management and palliative care evaluation as well as goals of care discussion.  Consulted hospitalist for admission.  He is admitted.    Clinical Course as of 02/05/24 1945  Mon Feb 05, 2024  1621 Called sister who is emergency contact, no reply.  Will attempt call back at a later time. [TT]  1708 Attempted to call sister again, no reply, voicemail box is full. [TT]  1805 CT Head Wo Contrast IMPRESSION: 1. Generalized cerebral atrophy and microvascular disease changes of the supratentorial brain. 2. No acute intracranial abnormalit   [TT]  1805 DG Chest 1 View IMPRESSION: 1. Chronic loculated left hydropneumothorax with increased pleural fluid density compared to prior radiograph. Worsened airspace disease at left base. 2. Small right-sided pleural effusion appears diminished compared to prior radiograph.   [TT]  1805 Independent review of labs, no leukocytosis, electrolytes really deranged, troponins not elevated, BNP is elevated. [TT]  1806 Resp panel by RT-PCR (RSV, Flu A&B, Covid) Anterior Nasal Swab Negative [TT]  1913 CT Angio Chest PE W/Cm &/Or Wo Cm IMPRESSION: 1. No CT evidence of central pulmonary artery embolus. 2. Similar size of loculated left hydropneumothorax with decrease in the air content and increased fluid content. 3.  Similar area of left perihilar airspace density with air bronchogram. 4. Small right pleural effusion slightly decreased since the prior CT. 5. A 7 mm nodular density in the right middle lobe along the minor fissure relatively similar to prior CT.   [TT]    Clinical Course User Index [TT] Waymond Lorelle Cummins, MD     FINAL CLINICAL IMPRESSION(S) / ED DIAGNOSES   Final diagnoses:  Dyspnea,  unspecified type  Altered mental status, unspecified altered mental status type  Malignant neoplasm of lung, unspecified laterality, unspecified part of lung (HCC)  Loculated pleural effusion     Rx / DC Orders   ED Discharge Orders     None        Note:  This document was prepared using Dragon voice recognition software and may include unintentional dictation errors.    Waymond Lorelle Cummins, MD 02/05/24 [phone]

## 2024-02-05 NOTE — H&P (Incomplete)
 " History and Physical  Mitchell Frye FMW:[id number] DOB: Nov 12, 1950 DOA: 02/05/2024  Referring physician: Dr. Jerri, EDP  PCP: [facility]  Outpatient Specialists: Medical oncology, hospice care Patient coming from: Home  Chief Complaint: Shortness of breath.  HPI: Mitchell Frye is a 73 y.o. male with medical history significant for stage IV lung cancer with hospice care recommended by oncology, hypertension, hyperlipidemia, who presents from home due to shortness of breath.  Hospice care nurse called EMS due to increasing shortness of breath.  Reportedly, per hospice care nurse, the patient is full code.  Multiple attempts were made to reach out to family members unsuccessfully.  Upon EMS arrival the patient was altered which is his baseline.  No prior history of dementia.  No reported subjective fevers or chills.  In the ER, tachycardic with heart rate 102, tachypneic with respiration rate 25, 100%       ED Course: ***  Review of Systems: Review of systems as noted in the HPI. All other systems reviewed and are negative.   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 [facility])    Past Surgical History:  Procedure Laterality Date   CHEST TUBE INSERTION Right 04/25/2023   Procedure: RIGHT PLEURAL CHEST TUBE INSERTION;  Surgeon: Kerrin Elspeth BROCKS, MD;  Location: [facility] OR;  Service: Thoracic;  Laterality: Right;   ESOPHAGOGASTRODUODENOSCOPY (EGD) WITH PROPOFOL  N/A 12/01/2020   Procedure: ESOPHAGOGASTRODUODENOSCOPY (EGD) WITH PROPOFOL ;  Surgeon: Unk Corinn Skiff, MD;  Location: [facility] ENDOSCOPY;  Service: Gastroenterology;  Laterality: N/A;   IR THORACENTESIS ASP PLEURAL SPACE W/IMG GUIDE  12/26/2022   PERICARDIAL FLUID DRAINAGE N/A 04/25/2023   Procedure: DRAINAGE, PERICARDIAL EFFUSION;  Surgeon: Kerrin Elspeth BROCKS, MD;  Location: [facility] OR;  Service: Thoracic;  Laterality:  N/A;   PERICARDIAL WINDOW N/A 04/25/2023   Procedure: CREATION, PERICARDIAL WINDOW;  Surgeon: Kerrin Elspeth BROCKS, MD;  Location: [facility] OR;  Service: Thoracic;  Laterality: N/A;   PLEURAL EFFUSION DRAINAGE Bilateral 04/25/2023   Procedure: DRAINAGE, PLEURAL EFFUSION;  Surgeon: Kerrin Elspeth BROCKS, MD;  Location: [facility] OR;  Service: Thoracic;  Laterality: Bilateral;   PORTA CATH INSERTION N/A 10/22/2019   Procedure: PORTA CATH INSERTION;  Surgeon: Jama Cordella MATSU, MD;  Location: [facility] INVASIVE CV LAB;  Service: Cardiovascular;  Laterality: N/A;   VIDEO ASSISTED THORACOSCOPY (VATS)/DECORTICATION Left 04/25/2023   Procedure: VIDEO ASSISTED THORACOSCOPY (VATS)/DECORTICATION;  Surgeon: Kerrin Elspeth BROCKS, MD;  Location: [facility] OR;  Service: Thoracic;  Laterality: Left;   VIDEO BRONCHOSCOPY WITH ENDOBRONCHIAL ULTRASOUND N/A 09/25/2019   Procedure: VIDEO BRONCHOSCOPY WITH ENDOBRONCHIAL ULTRASOUND;  Surgeon: Tamea Dedra CROME, MD;  Location: [facility] ORS;  Service: Pulmonary;  Laterality: N/A;    Social History:  reports that he quit smoking about 31 years ago. His smoking use included cigarettes. He started smoking about 52 years ago. He has a 20 pack-year smoking history. He has never used smokeless tobacco. He reports that he does not currently use alcohol . He reports that he does not use drugs.   Allergies[1]  Family History  Problem Relation Age of Onset   Cancer Brother     ***  Prior to Admission medications  Medication Sig Start Date End Date Taking? Authorizing Provider  acetaminophen  (TYLENOL ) 500 MG tablet Take 500 mg by mouth every 6 (six) hours as needed for mild pain (pain score 1-3) (takes as needed for generalized pain.  Denies  pain at this time.).    [provider]  albuterol  (VENTOLIN  HFA) 108 (90 Base) MCG/ACT inhaler Inhale 2 puffs into the lungs every 4 (four) hours as needed for wheezing or shortness of breath. 10/11/23   Melanee Annah BROCKS, MD  ascorbic acid  (VITAMIN C ) 500 MG  tablet Take 1 tablet (500 mg total) by mouth daily. 10/07/23   Fausto Burnard LABOR, DO  feeding supplement (ENSURE PLUS HIGH PROTEIN) LIQD Take 237 mLs by mouth 3 (three) times daily between meals. 10/06/23   Fausto Burnard LABOR, DO  iron  polysaccharides (NIFEREX) 150 MG capsule Take 1 capsule (150 mg total) by mouth daily. 10/07/23   Fausto Burnard LABOR, DO  lactulose  (CHRONULAC ) 10 GM/15ML solution Take 15-30 mLs (10-20 g total) by mouth 2 (two) times daily as needed for mild constipation. 11/01/23   Borders, Fonda SAUNDERS, NP  LORazepam  (ATIVAN ) 0.5 MG tablet TAKE 1 TABLET(0.5 MG) BY MOUTH EVERY 8 HOURS AS NEEDED FOR ANXIETY 11/23/23   Borders, Fonda SAUNDERS, NP  megestrol  (MEGACE ) 40 MG tablet Take 1 tablet (40 mg total) by mouth daily. Patient not taking: Reported on 11/01/2023 08/16/23   Melanee Annah BROCKS, MD  morphine  (MS CONTIN ) 15 MG 12 hr tablet Take 1 tablet (15 mg total) by mouth every 12 (twelve) hours. 11/01/23   Borders, Fonda SAUNDERS, NP  Multiple Vitamin (MULTIVITAMIN WITH MINERALS) TABS tablet Take 1 tablet by mouth daily. 10/07/23   Fausto Burnard A, DO  naloxone  (NARCAN ) nasal spray 4 mg/0.1 mL SPRAY 1 SPRAY INTO ONE NOSTRIL AS DIRECTED FOR OPIOID OVERDOSE (TURN PERSON ON SIDE AFTER DOSE. IF NO RESPONSE IN 2-3 MINUTES OR PERSON RESPONDS BUT RELAPSES, REPEAT USING A NEW SPRAY DEVICE AND SPRAY INTO THE OTHER NOSTRIL. CALL 911 AFTER USE.) * EMERGENCY USE ONLY * Patient not taking: Reported on 11/01/2023 10/11/23   Borders, Fonda SAUNDERS, NP  oxyCODONE  (OXY IR/ROXICODONE ) 5 MG immediate release tablet Take 1 tablet (5 mg total) by mouth every 6 (six) hours as needed. 01/15/24   Borders, Fonda SAUNDERS, NP  pantoprazole  (PROTONIX ) 20 MG tablet Take 1 tablet (20 mg total) by mouth every morning. 07/05/23   Rao, Archana C, MD  umeclidinium-vilanterol (ANORO ELLIPTA ) 62.5-25 MCG/ACT AEPB Inhale 1 puff into the lungs daily. 10/11/23   Assaker, Darrin, MD  Vitamin D , Ergocalciferol , (DRISDOL ) 1.25 MG (50000 UNIT) CAPS capsule Take 1  capsule (50,000 Units total) by mouth every 7 (seven) days. 10/10/23   Fausto Burnard LABOR, DO    Physical Exam: BP 125/77   Pulse 86   Temp (!) 97.4 F (36.3 C) (Oral)   Resp 14   SpO2 100%   General: 73 y.o. year-old male well developed well nourished in no acute distress.  Alert and oriented x3. Cardiovascular: Regular rate and rhythm with no rubs or gallops.  No thyromegaly or JVD noted.  No lower extremity edema. 2/4 pulses in all 4 extremities. Respiratory: Clear to auscultation with no wheezes or rales. Good inspiratory effort. Abdomen: Soft nontender nondistended with normal bowel sounds x4 quadrants. Muskuloskeletal: No cyanosis, clubbing or edema noted bilaterally Neuro: CN II-XII intact, strength, sensation, reflexes Skin: No ulcerative lesions noted or rashes Psychiatry: Judgement and insight appear normal. Mood is appropriate for condition and setting          Labs on Admission:  Basic Metabolic Panel: Recent Labs  Lab 02/05/24 1624  NA 136  K 4.5  CL 97*  CO2 26  GLUCOSE 108*  BUN 18  CREATININE 0.97  CALCIUM 9.5   Liver Function Tests: Recent Labs  Lab 02/05/24 1624  AST 20  ALT <5  ALKPHOS 65  BILITOT 0.6  PROT 8.2*  ALBUMIN  3.1*   No results for input(s): LIPASE, AMYLASE in the last 168 hours. No results for input(s): AMMONIA in the last 168 hours. CBC: Recent Labs  Lab 02/05/24 1624  WBC 9.1  NEUTROABS 7.9*  HGB 9.8*  HCT 35.3*  MCV 72.5*  PLT 414*   Cardiac Enzymes: No results for input(s): CKTOTAL, CKMB, CKMBINDEX, TROPONINI in the last 168 hours.  BNP (last 3 results) No results for input(s): BNP in the last 8760 hours.  ProBNP (last 3 results) Recent Labs    02/05/24 1624  PROBNP 1,741.0*    CBG: No results for input(s): GLUCAP in the last 168 hours.  Radiological Exams on Admission: CT Angio Chest PE W/Cm &/Or Wo Cm Result Date: 02/05/2024 CLINICAL DATA:  Concern for pulmonary embolism. Left lower  lobe lung cancer. EXAM: CT ANGIOGRAPHY CHEST WITH CONTRAST TECHNIQUE: Multidetector CT imaging of the chest was performed using the standard protocol during bolus administration of intravenous contrast. Multiplanar CT image reconstructions and MIPs were obtained to evaluate the vascular anatomy. RADIATION DOSE REDUCTION: This exam was performed according to the departmental dose-optimization program which includes automated exposure control, adjustment of the mA and/or kV according to patient size and/or use of iterative reconstruction technique. CONTRAST:  75mL OMNIPAQUE  IOHEXOL  350 MG/ML SOLN COMPARISON:  Chest CT dated 10/13/2023. FINDINGS: Evaluation of this exam is limited due to respiratory motion. Cardiovascular: There is no cardiomegaly or pericardial effusion. The thoracic aorta is unremarkable. Evaluation of the pulmonary arteries is limited due to respiratory motion and suboptimal opacification and timing of the contrast. No central pulmonary artery embolus identified. Right-sided Port-A-Cath with tip at the cavoatrial junction. Mediastinum/Nodes: No obvious adenopathy. Evaluation of left hilar lymph nodes is very limited due to consolidative changes of the adjacent lungs and diffuse mediastinal edema. The esophagus is poorly visualized. No mediastinal fluid collection. Lungs/Pleura: Similar size loculated left hydropneumothorax with decrease in the there content and increased fluid content. Similar area of left perihilar airspace density extending into the upper, middle, and lower lobes with air bronchogram. Small right pleural effusion slightly decreased since the prior CT. A 7 mm nodular density in the right middle lobe along the minor fissure relatively similar to prior CT. No pneumothorax. The central airways are patent. Upper Abdomen: No acute abnormality. Musculoskeletal: There is loss of subcutaneous fat and cachexia. Diffuse subcutaneous edema and anasarca. No acute osseous pathology. Review of  the MIP images confirms the above findings. IMPRESSION: 1. No CT evidence of central pulmonary artery embolus. 2. Similar size of loculated left hydropneumothorax with decrease in the air content and increased fluid content. 3. Similar area of left perihilar airspace density with air bronchogram. 4. Small right pleural effusion slightly decreased since the prior CT. 5. A 7 mm nodular density in the right middle lobe along the minor fissure relatively similar to prior CT. Electronically Signed   By: Vanetta Chou M.D.   On: 02/05/2024 19:04   CT Head Wo Contrast Result Date: 02/05/2024 CLINICAL DATA:  History of stage IV lung cancer presenting with shortness of breath and agitation. EXAM: CT HEAD WITHOUT CONTRAST TECHNIQUE: Contiguous axial images were obtained from the base of the skull through the vertex without intravenous contrast. RADIATION DOSE REDUCTION: This exam was performed according to the departmental dose-optimization program which includes automated exposure control, adjustment of  the mA and/or kV according to patient size and/or use of iterative reconstruction technique. COMPARISON:  August 14, 2022 FINDINGS: Brain: There is generalized cerebral atrophy with widening of the extra-axial spaces and ventricular dilatation. There are areas of decreased attenuation within the white matter tracts of the supratentorial brain, consistent with microvascular disease changes. Vascular: No hyperdense vessel or unexpected calcification. Skull: Normal. Negative for fracture or focal lesion. Sinuses/Orbits: No acute finding. Other: None. IMPRESSION: 1. Generalized cerebral atrophy and microvascular disease changes of the supratentorial brain. 2. No acute intracranial abnormality. Electronically Signed   By: Suzen Dials M.D.   On: 02/05/2024 17:39   DG Chest 1 View Result Date: 02/05/2024 CLINICAL DATA:  Short of breath EXAM: CHEST  1 VIEW COMPARISON:  10/06/2023, CT 10/13/2023, 08/23/2023 FINDINGS:  Right-sided central venous port with tip at the cavoatrial region. Small right-sided pleural effusion appears diminished compared to prior radiograph. Chronic loculated left hydropneumothorax with increased pleural fluid density compared to the prior radiograph. Stable cardiomediastinal silhouette. Worsened airspace disease at left base. Aortic atherosclerosis IMPRESSION: 1. Chronic loculated left hydropneumothorax with increased pleural fluid density compared to prior radiograph. Worsened airspace disease at left base. 2. Small right-sided pleural effusion appears diminished compared to prior radiograph. Electronically Signed   By: Luke Bun M.D.   On: 02/05/2024 17:23    EKG: I independently viewed the EKG done and my findings are as followed: ***   Assessment/Plan Present on Admission:  Dyspnea  Principal Problem:   Dyspnea   DVT prophylaxis: ***   Code Status: ***   Family Communication: ***   Disposition Plan: ***   Consults called: ***   Admission status: ***    Status is: Observation {Observation:23811}   Terry LOISE Hurst MD [facility] Pager [phone]  If 7PM-7AM, please contact night-coverage [email or web address] Password [facility]  02/05/2024, 7:45 PM      [1]  Allergies Allergen Reactions   Taxol  [Paclitaxel ] Other (See Comments)    Chest pain, hip pain, coughing , wheezing   "

## 2024-02-05 NOTE — ED Notes (Signed)
 Pt's sister [name], given update via phone as requested.

## 2024-02-05 NOTE — ED Notes (Signed)
Admitting rounding at bedside 

## 2024-02-05 NOTE — ED Triage Notes (Signed)
 Pt BIB EMS from home with complaints of SOB. Per EMS pt has stage 4 lung cancer. EMS was called by [facility] RN stating pt was SOB and agitated. Per EMS pt is on 2L via [place] at baseline. Per EMS Hospice RN stated pt needed a higher level of care but pt is hospice. Unknown it pt is getting treatments.

## 2024-02-05 NOTE — Progress Notes (Signed)
 [facility] Liaison Note  This patient is currently followed by [facility].  [facility] will follow through discharge disposition.    Please call with any hospice related questions or concerns.  [facility] Liaison [phone]

## 2024-02-05 NOTE — ED Notes (Signed)
Condom cath applied at this time.

## 2024-02-06 ENCOUNTER — Encounter: Payer: Self-pay | Admitting: Oncology

## 2024-02-06 DIAGNOSIS — Z7189 Other specified counseling: Secondary | ICD-10-CM

## 2024-02-06 DIAGNOSIS — C3492 Malignant neoplasm of unspecified part of left bronchus or lung: Secondary | ICD-10-CM

## 2024-02-06 DIAGNOSIS — Z66 Do not resuscitate: Secondary | ICD-10-CM | POA: Diagnosis not present

## 2024-02-06 DIAGNOSIS — Z515 Encounter for palliative care: Secondary | ICD-10-CM

## 2024-02-06 DIAGNOSIS — C3490 Malignant neoplasm of unspecified part of unspecified bronchus or lung: Secondary | ICD-10-CM

## 2024-02-06 DIAGNOSIS — R0609 Other forms of dyspnea: Secondary | ICD-10-CM

## 2024-02-06 DIAGNOSIS — G893 Neoplasm related pain (acute) (chronic): Secondary | ICD-10-CM

## 2024-02-06 DIAGNOSIS — R0600 Dyspnea, unspecified: Secondary | ICD-10-CM | POA: Diagnosis not present

## 2024-02-06 LAB — COMPREHENSIVE METABOLIC PANEL WITH GFR
ALT: <5 U/L (ref 0–44)
AST: 16 U/L (ref 15–41)
Albumin: 2.8 g/dL — ABNORMAL LOW (ref 3.5–5.0)
Alkaline Phosphatase: 59 U/L (ref 38–126)
Anion gap: 10 (ref 5–15)
BUN: 19 mg/dL (ref 8–23)
CO2: 30 mmol/L (ref 22–32)
Calcium: 9.3 mg/dL (ref 8.9–10.3)
Chloride: 99 mmol/L (ref 98–111)
Creatinine, Ser: 0.93 mg/dL (ref 0.61–1.24)
GFR, Estimated: >60 mL/min
Glucose, Bld: 85 mg/dL (ref 70–99)
Potassium: 4.5 mmol/L (ref 3.5–5.1)
Sodium: 139 mmol/L (ref 135–145)
Total Bilirubin: 0.4 mg/dL (ref 0.0–1.2)
Total Protein: 7.4 g/dL (ref 6.5–8.1)

## 2024-02-06 LAB — CBC WITH DIFFERENTIAL/PLATELET
Abs Immature Granulocytes: 0.08 K/uL — ABNORMAL HIGH (ref 0.00–0.07)
Basophils Absolute: 0 K/uL (ref 0.0–0.1)
Basophils Relative: 0 %
Eosinophils Absolute: 0 K/uL (ref 0.0–0.5)
Eosinophils Relative: 0 %
HCT: 31.2 % — ABNORMAL LOW (ref 39.0–52.0)
Hemoglobin: 8.9 g/dL — ABNORMAL LOW (ref 13.0–17.0)
Immature Granulocytes: 1 %
Lymphocytes Relative: 7 %
Lymphs Abs: 0.5 K/uL — ABNORMAL LOW (ref 0.7–4.0)
MCH: 20.4 pg — ABNORMAL LOW (ref 26.0–34.0)
MCHC: 28.5 g/dL — ABNORMAL LOW (ref 30.0–36.0)
MCV: 71.6 fL — ABNORMAL LOW (ref 80.0–100.0)
Monocytes Absolute: 0.6 K/uL (ref 0.1–1.0)
Monocytes Relative: 8 %
Neutro Abs: 6.6 K/uL (ref 1.7–7.7)
Neutrophils Relative %: 84 %
Platelets: 386 K/uL (ref 150–400)
RBC: 4.36 MIL/uL (ref 4.22–5.81)
RDW: 17.7 % — ABNORMAL HIGH (ref 11.5–15.5)
WBC: 7.9 K/uL (ref 4.0–10.5)
nRBC: 0 % (ref 0.0–0.2)

## 2024-02-06 LAB — PHOSPHORUS: Phosphorus: 4.5 mg/dL (ref 2.5–4.6)

## 2024-02-06 LAB — MAGNESIUM: Magnesium: 2.2 mg/dL (ref 1.7–2.4)

## 2024-02-06 MED ORDER — GLYCOPYRROLATE 1 MG PO TABS: 1.0000 mg | ORAL_TABLET | ORAL | Status: DC | PRN

## 2024-02-06 MED ORDER — LORAZEPAM 2 MG/ML IJ SOLN: 0.5000 mg | INTRAMUSCULAR | Status: DC | PRN

## 2024-02-06 MED ORDER — LORAZEPAM 2 MG/ML IJ SOLN: 1.0000 mg | INTRAMUSCULAR | Status: DC | PRN

## 2024-02-06 MED ORDER — OXYCODONE HCL 5 MG PO TABS
5.0000 mg | ORAL_TABLET | ORAL | Status: DC | PRN
  Administered 2024-02-06: 5 mg via ORAL
  Filled 2024-02-06: qty 1

## 2024-02-06 MED ORDER — GLYCOPYRROLATE 0.2 MG/ML IJ SOLN: 0.2000 mg | Freq: Two times a day (BID) | INTRAMUSCULAR | Status: DC

## 2024-02-06 MED ORDER — LORAZEPAM 0.5 MG PO TABS: 0.5000 mg | ORAL_TABLET | ORAL | Status: DC | PRN

## 2024-02-06 MED ORDER — LORAZEPAM 1 MG PO TABS
1.0000 mg | ORAL_TABLET | ORAL | Status: DC | PRN
  Filled 2024-02-06: qty 1

## 2024-02-06 MED ORDER — POLYVINYL ALCOHOL 1.4 % OP SOLN: 1.0000 [drp] | Freq: Four times a day (QID) | OPHTHALMIC | Status: DC | PRN

## 2024-02-06 MED ORDER — LORAZEPAM 2 MG/ML PO CONC: 0.5000 mg | ORAL | Status: DC | PRN

## 2024-02-06 MED ORDER — MORPHINE SULFATE (CONCENTRATE) 10 MG /0.5 ML PO SOLN
10.0000 mg | ORAL | Status: DC | PRN
  Administered 2024-02-06: 10 mg via ORAL
  Filled 2024-02-06: qty 0.5

## 2024-02-06 MED ORDER — BIOTENE DRY MOUTH MT LIQD: 15.0000 mL | OROMUCOSAL | Status: DC | PRN

## 2024-02-06 MED ORDER — MORPHINE SULFATE (CONCENTRATE) 10 MG /0.5 ML PO SOLN
10.0000 mg | ORAL | Status: DC | PRN
  Administered 2024-02-06 (×2): 10 mg via ORAL
  Filled 2024-02-06 (×2): qty 0.5

## 2024-02-06 MED ORDER — LORAZEPAM 2 MG/ML PO CONC
0.5000 mg | ORAL | Status: DC | PRN
  Administered 2024-02-06: 0.5 mg via SUBLINGUAL
  Filled 2024-02-06: qty 1

## 2024-02-06 NOTE — Progress Notes (Signed)
" °   02/06/24 1230  Spiritual Encounters  Type of Visit Initial  Care provided to: Patient  Conversation partners present during encounter Nurse  Referral source Other (comment)  Reason for visit Routine spiritual support  OnCall Visit Yes   Chaplain visited patient per an [place] Consult in the system.  Patient was concerned about his health diagnosis and feeling lonely and scared.  Patient also complained about his pain level.  Chaplain shared with staff and offered a compassionate presence and reflective listening.  Patient named siblings and cousins in his life and also that he believes God can do everything.  Patient said he wanted to stay positive.    Rev. Rana M. Nicholaus, M.Div. Chaplain Resident [facility] "

## 2024-02-06 NOTE — Plan of Care (Signed)
  Problem: Education: Goal: Knowledge of General Education information will improve Description: Including pain rating scale, medication(s)/side effects and non-pharmacologic comfort measures Outcome: Adequate for Discharge   Problem: Health Behavior/Discharge Planning: Goal: Ability to manage health-related needs will improve Outcome: Adequate for Discharge   Problem: Clinical Measurements: Goal: Ability to maintain clinical measurements within normal limits will improve Outcome: Adequate for Discharge Goal: Will remain free from infection Outcome: Adequate for Discharge Goal: Diagnostic test results will improve Outcome: Adequate for Discharge Goal: Respiratory complications will improve Outcome: Adequate for Discharge Goal: Cardiovascular complication will be avoided Outcome: Adequate for Discharge   Problem: Activity: Goal: Risk for activity intolerance will decrease Outcome: Adequate for Discharge   Problem: Nutrition: Goal: Adequate nutrition will be maintained Outcome: Adequate for Discharge   Problem: Coping: Goal: Level of anxiety will decrease Outcome: Adequate for Discharge   Problem: Elimination: Goal: Will not experience complications related to bowel motility Outcome: Adequate for Discharge Goal: Will not experience complications related to urinary retention Outcome: Adequate for Discharge   Problem: Pain Managment: Goal: General experience of comfort will improve and/or be controlled Outcome: Adequate for Discharge   Problem: Safety: Goal: Ability to remain free from injury will improve Outcome: Adequate for Discharge   Problem: Skin Integrity: Goal: Risk for impaired skin integrity will decrease Outcome: Adequate for Discharge   Problem: Education: Goal: Knowledge of the prescribed therapeutic regimen will improve Outcome: Adequate for Discharge   Problem: Coping: Goal: Ability to identify and develop effective coping behavior will  improve Outcome: Adequate for Discharge   Problem: Clinical Measurements: Goal: Quality of life will improve Outcome: Adequate for Discharge   Problem: Respiratory: Goal: Verbalizations of increased ease of respirations will increase Outcome: Adequate for Discharge   Problem: Role Relationship: Goal: Family's ability to cope with current situation will improve Outcome: Adequate for Discharge Goal: Ability to verbalize concerns, feelings, and thoughts to partner or family member will improve Outcome: Adequate for Discharge   Problem: Pain Management: Goal: Satisfaction with pain management regimen will improve Outcome: Adequate for Discharge

## 2024-02-06 NOTE — Consult Note (Addendum)
 "                                                  Palliative Care Consult Note                                  Date: 02/06/2024   Patient Name: Mitchell Frye  DOB:1950-02-15  FMW:[id number]  Age / Sex:73 y.o., male  [facility]P: [facility] Referring Physician: Lanetta Lingo, MD  Reason for Consultation: Establishing goals of care, Non pain symptom management, and Pain control  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 [facility])    Assessment & Plan:   HPI/Patient Profile: 73 y.o. 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 Per H&P on 02/05/2024 by Shona DO patient was seen by hospice nurse who activated EMS for shortness of breath and further symptom management. CT PE 02/05/2024 negative for pulmonary embolus, stable left hydropneumothorax, small right pleural effusion, and stable 7 mm nodule in right middle lobe. CT head 01/2024 demonstrated general cerebral atrophy and microvascular disease and negative for acute intracranial abnormality. Symptoms most likely attributed to lung cancer and persistent pleural effusion.   Palliative medicine consulted for goals of care conversation and symptom management.   SUMMARY OF RECOMMENDATIONS   DNR-comfort [facility] to evaluate for inpatient hospice [name] Bracken - sister is [facility] decision maker Symptom management as below  Symptom Management:  Morphine /Oxycodone  for pain/dyspnea/increased work of breathing/RR>25 Tylenol  PRN pain/fever Robinul  PRN secretions Ativan  PRN anxiety/seizure/sleep/distress Liquifilm Tears PRN dry eye  Code Status: DNR - Comfort  Prognosis:  < 4 weeks  Discharge Planning:  Hospice facility   Discussed with: Agbata MD  02/06/2024 about patient transitioning to full comfort measures after discussion with sister - [name]. Discussed with [facility] liaison about family's decision to transition to full comfort measures.   Subjective:   Reviewed medical records, received report from team, assessed the patient and then meet at the patient's bedside to discuss diagnosis, prognosis, GOC, EOL wishes disposition and options.  I met with patient at bedside without any visitors. Spoke on phone with sister [name]).   We meet to discuss diagnosis prognosis, GOC, EOL wishes, disposition and options. Concept of Palliative Care was introduced as specialized medical care for people and their families living with serious illness.  If focuses on providing relief from the symptoms and stress of a serious illness.  The goal is to improve quality of life for both the patient and the family. Values and goals of care important to patient and family were attempted to be elicited.  Created space and opportunity for patient  and family to explore thoughts and feelings regarding current medical situation   Natural trajectory and current clinical status were discussed. Questions and concerns addressed. Patient encouraged to call with questions or concerns.    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, occupational - Never married and no children - Patient enjoyed singing in the past and sister shares that the patient's genre of music to sing were love songs - Patient is a  religious person but has not been able to attend church services for many years  Baseline Status: - Patient lives at home with his sister who is his primary caregiver, but she is also a caregiver for 2 other siblings who have extensive health issues so she is not able to give the patient the time he needs for his own care - Patient has been declining in the last couple of months, sister shares that patient has not been doing well since he was discharged in September 2025 - Patient's memory deficits have been a problem with him being compliant with his medications  Today's Discussion: - Spoke with sister and discussed how the patient has been doing prior to admission, current admission, and next steps regarding comfort measures - Sister shared that she understood some of the patient's overall health conditions but did not have a good understanding of prognosis and the philosophy of hospice - Educated sister that although her concerns were that the patient has not been eating well over the last couple of weeks and was losing weight were warranted, the overall big picture is that the patient has an overall failure to thrive with his memory deficits and stage IV lung cancer that makes prognosis grim - Educated sister on overall poor PO intake and that at this stage, the patient may not feel hunger the same way others do and the body is able to break down the nutrients it needs to sustain itself for and it will also release chemicals that induce some mild euphoria - Educated sister that the patient can eat whatever he likes whenever he likes, but that it is not advised to force the patient to eat when he does not express interest - Educated sister on dehydration as she was concerned about discontinuing IVF at this time, shared with her that these aggressive medical interventions will prolong the patient's discomfort and suffering, she was also concerned about the patient's thirst and shared with her we can  provide the fluids he requests but overall IVF does not help with the sensation of thirst and the best way to manage that is to provide gentle PO and mouth swabs to keep the patient's mouth from drying out - Educated sister on hospice philosophy again and reiterated that the medical team has shifted focus from a curative standpoint to just caring for the patient and addressing his shortness of breath and anxiety knowing that the time he last left is limited - Shared that transitioning to full comfort measures is similar to hospice but it is in the hospital and that the focus shifts to stopping lab draws, further imaging, no more IV fluids, no more antibiotics and fully focusing on addressing the patient's discomfort - Discussed CODE status with sister who shares that patient's last conversation with her regarding code status was that he expressed he would not want his chest to be pressed on or have his ribs broken given how frail he is, shared with sister that  overall chances of successful recovery as well as chances of being discharged given the patient's co-morbid conditions was < 2% - Sister expressed concern for ability to adequately care for patient at home with hospice, shared with her that patient most likely meets inpatient criteria at this point, but will defer to hospice liaison - Sister is agreeable to transitioning patient to DNR with full comfort measures and hospice evaluation for inpatient hospice facility  Review of Systems  Unable to perform ROS   Objective:   Primary Diagnoses: Present on Admission:  Dyspnea  Metastatic adenocarcinoma to lung with unknown primary site [facility])  Neoplasm related pain  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  Malnutrition of moderate degree   Vital Signs:  BP 128/88 (BP Location: Left Arm)   Pulse 86   Temp (!) 97.5 F (36.4 C) (Oral)   Resp (!) 22   Ht 5' 10 (1.778 m)   Wt 52.4 kg   SpO2 100%   BMI 16.58 kg/m   Physical  Exam Constitutional:      Appearance: He is ill-appearing.     Comments: Cachectic  HENT:     Head: Normocephalic.  Eyes:     Extraocular Movements: Extraocular movements intact.  Cardiovascular:     Rate and Rhythm: Normal rate.  Pulmonary:     Effort: Tachypnea, accessory muscle usage and respiratory distress present.  Musculoskeletal:     Right lower leg: No edema.     Left lower leg: No edema.  Skin:    General: Skin is warm and dry.  Neurological:     Mental Status: He is alert. He is disoriented.     Palliative Assessment/Data: 30%   Thank you for allowing us  to participate in the care of Jovann Luse PMT will continue to support holistically.  I personally spent a total of 75 minutes in the care of the patient today including preparing to see the patient, getting/reviewing separately obtained history, performing a medically appropriate exam/evaluation, counseling and educating, placing orders, referring and communicating with other health care professionals, documenting clinical information in the EHR, and coordinating care.  Signed by: Fairy FORBES Shan DEVONNA Palliative Medicine Team  Team Phone # [phone] (Nights/Weekends)  02/06/2024, 2:38 PM   "

## 2024-02-06 NOTE — Plan of Care (Signed)

## 2024-02-06 NOTE — TOC Transition Note (Signed)
 Transition of Care [facility]) - Discharge Note   Patient Details  Name: Mitchell Frye MRN: [id number] Date of Birth: [birth year]  Transition of Care [facility] CM/SW Contact:  Nathanael CHRISTELLA Ring, RN Phone Number: 02/06/2024, 2:38 PM   Clinical Narrative:    Patient has been accepted at the [facility]: [facility].  Bedside nurse will call report.  Transport has been arranged with [facility].  [facility] did not give an ETA.   Final next level of care: Hospice Medical Facility Barriers to Discharge: Barriers Resolved   Patient Goals and CMS Choice            Discharge Placement                Patient to be transferred to facility by: [facility] Name of family member notified: [name] Bracken- sister Patient and family notified of of transfer: 02/06/24  Discharge Plan and Services Additional resources added to the After Visit Summary for                                       Social Drivers of Health (SDOH) Interventions SDOH Screenings   Food Insecurity: No Food Insecurity (10/01/2023)  Housing: Low Risk (10/01/2023)  Transportation Needs: No Transportation Needs (10/01/2023)  Utilities: Not At Risk (10/01/2023)  Depression (PHQ2-9): Low Risk (11/01/2023)  Social Connections: Socially Isolated (10/01/2023)  Tobacco Use: Medium Risk (11/01/2023)     Readmission Risk Interventions    10/06/2023   12:14 PM  Readmission Risk Prevention Plan  Transportation Screening Complete  Medication Review (RN Care Manager) Complete  PCP or Specialist appointment within 3-5 days of discharge Complete  HRI or Home Care Consult Complete  SW Recovery Care/Counseling Consult Complete  Palliative Care Screening Not Applicable  Skilled Nursing Facility Not Applicable

## 2024-02-06 NOTE — TOC CM/SW Note (Signed)
 Patient is current with [facility] for Hospice.  Misty with [facility] was at the bedside speaking with patient this morning and he voiced that he wanted to go to the hospice home.  Misty is going to try and reach his sister as he is still a full code and his code status would need to be changed to DNR to go to the hospice home. Palliative is following as well.

## 2024-02-06 NOTE — Progress Notes (Signed)
" °   02/06/24 1445  Spiritual Encounters  Type of Visit Follow up  Care provided to: Pt and family  Reason for visit Routine spiritual support  OnCall Visit Yes   Chaplain returned to see patient later in the day as she'd said after her first visit.  Patient had Uncle and nephew at bedside.  Chaplain introduced herself to the family and excused herself so they could enjoy their visit.  Chaplain will remain available as needed/requested by patient/family or staff.    Rev. Rana M. Nicholaus, M.Div. Chaplain [facility] "

## 2024-02-06 NOTE — Discharge Summary (Signed)
 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 *  Hospital Course:  Mitchell Frye is a 73 y.o. male with medical history significant for stage IV lung cancer with hospice care recommended by oncology, hypertension, hyperlipidemia, who presents from home due to shortness of breath.  Hospice care nurse activated EMS due to increasing shortness of breath.  Reportedly, per hospice care nurse, the patient is a full code, recently changed his DNR status.  Multiple attempts were made to reach out to family members unsuccessfully.  Upon EMS arrival, the patient was altered which is his baseline.  No prior history of dementia reported.  No reported subjective fevers or chills.   In the ER, tachycardic with heart rate 102, tachypneic with respiration rate 25, O2 saturation 100% on 2 L nasal cannula.  At the time of this visit, the patient is alert and confused, emaciated with BMI of 16.   CT scan angio chest revealed no evidence of central pulmonary artery embolus, similar size of loculated left hydropneumothorax with decrease in the area content and increased fluid content.  Similar area of left perihilar airspace density with air bronchogram.  Small right pleural effusion slightly decreased since the prior CT.  7 mm nodular density in the right middle lobe along the minor fissure relatively similar to prior CT.  TRH, hospitalist service, was asked to admit for symptoms  management.   ED Course: Temperature 99.5.  BP 123/82, pulse 93, respiratory 18, O2 saturation 100% on 2 L.      Assessment and Plan:  Dyspnea, suspect multifactorial secondary to lung cancer, persistent loculated effusion, POA Neoplasm related pain Continue symptom management Maintain O2 saturation above 90-92%. Palliative care/hospice evaluation   Stage IV lung cancer on hospice care, recommended by oncology Patient has been discharged to inpatient hospice   Severe protein calorie malnutrition Pleasure feeds as tolerated   Chronic HFpEF Euvolemic on exam Last 2D echo done on 05/08/2023 revealed LVEF 55 to 60% with grade 1 diastolic dysfunction.     Generalized weakness Secondary to known malignancy Palliative care evaluation     Microcytic anemia Most likely related to known malignancy               Consultants: Palliative care/hospice Procedures performed: None  Disposition: Hospice care Diet recommendation:  Discharge Diet Orders (From admission, onward)     Start     Ordered   02/06/24 0000  Diet general        02/06/24 1357           Regular diet DISCHARGE MEDICATION: Allergies as of 02/06/2024       Reactions   Taxol  [paclitaxel ] Other (See Comments)   Chest pain, hip pain, coughing , wheezing        Medication List     STOP taking these medications    albuterol  108 (90 Base) MCG/ACT inhaler Commonly known as: VENTOLIN  HFA   ascorbic acid  500 MG tablet Commonly known as: VITAMIN C   feeding supplement Liqd   iron  polysaccharides 150 MG capsule Commonly known as: NIFEREX   megestrol  40 MG tablet Commonly known as: MEGACE    multivitamin with minerals Tabs tablet   naloxone  4 MG/0.1ML Liqd nasal spray kit Commonly known as: NARCAN    umeclidinium-vilanterol 62.5-25 MCG/ACT Aepb Commonly known as: ANORO ELLIPTA    Vitamin D  (Ergocalciferol ) 1.25 MG (50000 UNIT) Caps capsule Commonly known as: DRISDOL        TAKE these  medications    acetaminophen  500 MG tablet Commonly known as: TYLENOL  Take 500 mg by mouth every 6 (six) hours as needed for mild pain (pain score 1-3) (takes as needed for generalized pain.  Denies pain at this time.).   lactulose  10 GM/15ML solution Commonly known as: CHRONULAC  Take 15-30 mLs (10-20 g total) by mouth 2 (two) times daily as needed for mild constipation.   LORazepam  0.5 MG tablet Commonly known as: ATIVAN  TAKE 1 TABLET(0.5 MG) BY MOUTH EVERY 8 HOURS AS NEEDED FOR ANXIETY   morphine  15 MG 12 hr tablet Commonly known as: MS CONTIN  Take 1 tablet (15 mg total) by mouth every 12 (twelve) hours.   oxyCODONE  5 MG immediate release tablet Commonly known as: Oxy IR/ROXICODONE  Take 1 tablet (5 mg total) by mouth every 6 (six) hours as needed.   pantoprazole  20 MG tablet Commonly known as: Protonix  Take 1 tablet (20 mg total) by mouth every morning.        Discharge Exam: Filed Weights   02/05/24 2150 02/06/24 0450  Weight: 52.7 kg 52.4 kg   General: 73 y.o. year-old male, chronically ill-appearing Cardiovascular: Regular rate and rhythm with no rubs or gallops.  No thyromegaly or JVD noted.  No lower extremity edema. 2/4 pulses in all 4 extremities. Respiratory: Diffuse rales bilaterally.  Poor inspiratory effort. Abdomen: Soft nontender nondistended with normal bowel sounds x4 quadrants. Muskuloskeletal: No cyanosis, clubbing or edema noted bilaterally Neuro: CN II-XII intact, strength, sensation, reflexes Skin: No ulcerative lesions noted or rashes Psychiatry: Appears anxious     Condition at discharge: poor  The results of significant diagnostics from this hospitalization (including imaging, microbiology, ancillary and laboratory) are listed below for reference.   Imaging Studies: CT Angio Chest PE W/Cm &/Or Wo Cm Result Date: 02/05/2024 CLINICAL DATA:  Concern for pulmonary embolism. Left lower lobe lung cancer. EXAM: CT ANGIOGRAPHY CHEST WITH CONTRAST  TECHNIQUE: Multidetector CT imaging of the chest was performed using the standard protocol during bolus administration of intravenous contrast. Multiplanar CT image reconstructions and MIPs were obtained to evaluate the vascular anatomy. RADIATION DOSE REDUCTION: This exam was performed according to the departmental dose-optimization program which includes automated exposure control, adjustment of the mA and/or kV according to patient size and/or use of iterative reconstruction technique. CONTRAST:  75mL OMNIPAQUE  IOHEXOL  350 MG/ML SOLN COMPARISON:  Chest CT dated 10/13/2023. FINDINGS: Evaluation of this exam is limited due to respiratory motion. Cardiovascular: There is no cardiomegaly or pericardial effusion. The thoracic aorta is unremarkable. Evaluation of the pulmonary arteries is limited due to respiratory motion and suboptimal opacification and timing of the contrast. No central pulmonary artery embolus identified. Right-sided Port-A-Cath with tip at the cavoatrial junction. Mediastinum/Nodes: No obvious adenopathy. Evaluation of left hilar lymph nodes is very limited due to consolidative changes of the adjacent lungs and diffuse mediastinal edema. The esophagus is poorly visualized. No mediastinal fluid collection. Lungs/Pleura: Similar size loculated left hydropneumothorax with decrease in the there content and increased fluid content. Similar area of left perihilar airspace density  extending into the upper, middle, and lower lobes with air bronchogram. Small right pleural effusion slightly decreased since the prior CT. A 7 mm nodular density in the right middle lobe along the minor fissure relatively similar to prior CT. No pneumothorax. The central airways are patent. Upper Abdomen: No acute abnormality. Musculoskeletal: There is loss of subcutaneous fat and cachexia. Diffuse subcutaneous edema and anasarca. No acute osseous pathology. Review of the MIP images confirms the above findings. IMPRESSION: 1.  No CT evidence of central pulmonary artery embolus. 2. Similar size of loculated left hydropneumothorax with decrease in the air content and increased fluid content. 3. Similar area of left perihilar airspace density with air bronchogram. 4. Small right pleural effusion slightly decreased since the prior CT. 5. A 7 mm nodular density in the right middle lobe along the minor fissure relatively similar to prior CT. Electronically Signed   By: Vanetta Chou M.D.   On: 02/05/2024 19:04   CT Head Wo Contrast Result Date: 02/05/2024 CLINICAL DATA:  History of stage IV lung cancer presenting with shortness of breath and agitation. EXAM: CT HEAD WITHOUT CONTRAST TECHNIQUE: Contiguous axial images were obtained from the base of the skull through the vertex without intravenous contrast. RADIATION DOSE REDUCTION: This exam was performed according to the departmental dose-optimization program which includes automated exposure control, adjustment of the mA and/or kV according to patient size and/or use of iterative reconstruction technique. COMPARISON:  August 14, 2022 FINDINGS: Brain: There is generalized cerebral atrophy with widening of the extra-axial spaces and ventricular dilatation. There are areas of decreased attenuation within the white matter tracts of the supratentorial brain, consistent with microvascular disease changes. Vascular: No hyperdense vessel or unexpected calcification. Skull: Normal. Negative for fracture or focal lesion. Sinuses/Orbits: No acute finding. Other: None. IMPRESSION: 1. Generalized cerebral atrophy and microvascular disease changes of the supratentorial brain. 2. No acute intracranial abnormality. Electronically Signed   By: Suzen Dials M.D.   On: 02/05/2024 17:39   DG Chest 1 View Result Date: 02/05/2024 CLINICAL DATA:  Short of breath EXAM: CHEST  1 VIEW COMPARISON:  10/06/2023, CT 10/13/2023, 08/23/2023 FINDINGS: Right-sided central venous port with tip at the cavoatrial  region. Small right-sided pleural effusion appears diminished compared to prior radiograph. Chronic loculated left hydropneumothorax with increased pleural fluid density compared to the prior radiograph. Stable cardiomediastinal silhouette. Worsened airspace disease at left base. Aortic atherosclerosis IMPRESSION: 1. Chronic loculated left hydropneumothorax with increased pleural fluid density compared to prior radiograph. Worsened airspace disease at left base. 2. Small right-sided pleural effusion appears diminished compared to prior radiograph. Electronically Signed   By: Luke Bun M.D.   On: 02/05/2024 17:23    Microbiology: Results for orders placed or performed during the hospital encounter of 02/05/24  Resp panel by RT-PCR (RSV, Flu A&B, Covid) Anterior Nasal Swab     Status: None   Collection Time: 02/05/24  4:24 PM   Specimen: Anterior Nasal Swab  Result Value Ref Range Status   SARS Coronavirus 2 by RT PCR NEGATIVE NEGATIVE Final    Comment: (NOTE) SARS-CoV-2 target nucleic acids are NOT DETECTED.  The SARS-CoV-2 RNA is generally detectable in upper respiratory specimens during the acute phase of infection. The lowest concentration of SARS-CoV-2 viral copies this assay can detect is 138 copies/mL. A negative result does not preclude SARS-Cov-2 infection and should not be used as the sole basis for treatment or other patient management decisions. A negative result may occur with  improper specimen collection/handling,  submission of specimen other than nasopharyngeal swab, presence of viral mutation(s) within the areas targeted by this assay, and inadequate number of viral copies(<138 copies/mL). A negative result must be combined with clinical observations, patient history, and epidemiological information. The expected result is Negative.  Fact Sheet for Patients:  bloggercourse.com  Fact Sheet for Healthcare Providers:   seriousbroker.it  This test is no t yet approved or cleared by the [place]  FDA and  has been authorized for detection and/or diagnosis of SARS-CoV-2 by FDA under an Emergency Use Authorization (EUA). This EUA will remain  in effect (meaning this test can be used) for the duration of the COVID-19 declaration under Section 564(b)(1) of the Act, 21 U.S.C.section 360bbb-3(b)(1), unless the authorization is terminated  or revoked sooner.       Influenza A by PCR NEGATIVE NEGATIVE Final   Influenza B by PCR NEGATIVE NEGATIVE Final    Comment: (NOTE) The Xpert Xpress SARS-CoV-2/FLU/RSV plus assay is intended as an aid in the diagnosis of influenza from Nasopharyngeal swab specimens and should not be used as a sole basis for treatment. Nasal washings and aspirates are unacceptable for Xpert Xpress SARS-CoV-2/FLU/RSV testing.  Fact Sheet for Patients: bloggercourse.com  Fact Sheet for Healthcare Providers: seriousbroker.it  This test is not yet approved or cleared by the [place]  FDA and has been authorized for detection and/or diagnosis of SARS-CoV-2 by FDA under an Emergency Use Authorization (EUA). This EUA will remain in effect (meaning this test can be used) for the duration of the COVID-19 declaration under Section 564(b)(1) of the Act, 21 U.S.C. section 360bbb-3(b)(1), unless the authorization is terminated or revoked.     Resp Syncytial Virus by PCR NEGATIVE NEGATIVE Final    Comment: (NOTE) Fact Sheet for Patients: bloggercourse.com  Fact Sheet for Healthcare Providers: seriousbroker.it  This test is not yet approved or cleared by the [place]  FDA and has been authorized for detection and/or diagnosis of SARS-CoV-2 by FDA under an Emergency Use Authorization (EUA). This EUA will remain in effect (meaning this test can be used) for  the duration of the COVID-19 declaration under Section 564(b)(1) of the Act, 21 U.S.C. section 360bbb-3(b)(1), unless the authorization is terminated or revoked.  Performed at [facility], [address] [place], [place] [address]     Labs: CBC: Recent Labs  Lab 02/05/24 1624 02/06/24 0528  WBC 9.1 7.9  NEUTROABS 7.9* 6.6  HGB 9.8* 8.9*  HCT 35.3* 31.2*  MCV 72.5* 71.6*  PLT 414* 386   Basic Metabolic Panel: Recent Labs  Lab 02/05/24 1624 02/06/24 0528  NA 136 139  K 4.5 4.5  CL 97* 99  CO2 26 30  GLUCOSE 108* 85  BUN 18 19  CREATININE 0.97 0.93  CALCIUM 9.5 9.3  MG  --  2.2  PHOS  --  4.5   Liver Function Tests: Recent Labs  Lab 02/05/24 1624 02/06/24 0528  AST 20 16  ALT <5 <5  ALKPHOS 65 59  BILITOT 0.6 0.4  PROT 8.2* 7.4  ALBUMIN  3.1* 2.8*   CBG: No results for input(s): GLUCAP in the last 168 hours.  Discharge time spent: greater than 30 minutes.  Signed: Aimee Somerset, MD [facility] 02/06/2024 "

## 2024-02-06 NOTE — Progress Notes (Signed)
 [facility] rm 106 [facility] liaison note:  Mitchell Frye is a current hospice patient of [facility] with a terminal diagnosis of Lung Cancer. He was admitted to the hospital with acute symptom management needs.   Met with patient in his room and he is agreeable to transfer to the hospice home for symptom management.   Hospice Home is able to accept patient at any time, nurse can call report to [phone].   Please don't hesitate to reach out for any hospice related questions or concerns.   Thank you Hunter Seip, BSN, [facility] liaison [phone]

## 2024-02-06 NOTE — Progress Notes (Signed)
 Report has been called in to [facility]. Pt at this time is 6th in line for pickup. [facility] has been notified.

## 2024-02-06 NOTE — Progress Notes (Signed)
 " Progress Note   Patient: Mitchell Frye FMW:[id number] DOB: 1950/03/04 DOA: 02/05/2024     0 DOS: the patient was seen and examined on 02/06/2024   Brief hospital course:  Mitchell Frye is a 73 y.o. male with medical history significant for stage IV lung cancer with hospice care recommended by oncology, hypertension, hyperlipidemia, who presents from home due to shortness of breath.  Hospice care nurse activated EMS due to increasing shortness of breath.  Reportedly, per hospice care nurse, the patient is a full code, recently changed his DNR status.  Multiple attempts were made to reach out to family members unsuccessfully.  Upon EMS arrival, the patient was altered which is his baseline.  No prior history of dementia reported.  No reported subjective fevers or chills.   In the ER, tachycardic with heart rate 102, tachypneic with respiration rate 25, O2 saturation 100% on 2 L nasal cannula.  At the time of this visit, the patient is alert and confused, emaciated with BMI of 16.   CT scan angio chest revealed no evidence of central pulmonary artery embolus, similar size of loculated left hydropneumothorax with decrease in the area content and increased fluid content.  Similar area of left perihilar airspace density with air bronchogram.  Small right pleural effusion slightly decreased since the prior CT.  7 mm nodular density in the right middle lobe along the minor fissure relatively similar to prior CT.  TRH, hospitalist service, was asked to admit for symptoms management.   ED Course: Temperature 99.5.  BP 123/82, pulse 93, respiratory 18, O2 saturation 100% on 2 L.    Assessment and Plan:  Principal Problem:   Dyspnea   Dyspnea, suspect multifactorial secondary to lung cancer, persistent loculated effusion, POA Chest pain secondary to known malignancy Continue symptom management Maintain O2 saturation above 90-92%. Palliative care/hospice evaluation   Stage IV lung cancer on hospice  care, recommended by oncology Recently changed his CODE STATUS to full code Palliative care medicine consulted to assist with establishing goals of care.   Severe protein calorie malnutrition BMI 16 Severe protein mass loss Continue to liberalize diet   Chronic HFpEF Euvolemic on exam Last 2D echo done on 05/08/2023 revealed LVEF 55 to 60% with grade 1 diastolic dysfunction.    Generalized weakness Secondary to known malignancy Palliative care evaluation    Microcytic anemia Most likely related to known malignancy           Subjective: Seen and evaluated at bedside.  Eating breakfast.  Grunting in discomfort  Physical Exam: Vitals:   02/06/24 0037 02/06/24 0450 02/06/24 0455 02/06/24 0840  BP: 123/81  123/82 128/88  Pulse: 88  93 86  Resp: 16  18 (!) 22  Temp: (!) 97.5 F (36.4 C)  (!) 97.5 F (36.4 C) (!) 97.5 F (36.4 C)  TempSrc: Oral  Oral Oral  SpO2: 100%  100% 100%  Weight:  52.4 kg    Height:       General: 73 y.o. year-old male, chronically ill-appearing Cardiovascular: Regular rate and rhythm with no rubs or gallops.  No thyromegaly or JVD noted.  No lower extremity edema. 2/4 pulses in all 4 extremities. Respiratory: Diffuse rales bilaterally.  Poor inspiratory effort. Abdomen: Soft nontender nondistended with normal bowel sounds x4 quadrants. Muskuloskeletal: No cyanosis, clubbing or edema noted bilaterally Neuro: CN II-XII intact, strength, sensation, reflexes Skin: No ulcerative lesions noted or rashes Psychiatry: Appears anxious   Data Reviewed: Labs reviewed.  Hemoglobin 8.9, hematocrit 31.2,  MCV 71.6 Labs reviewed  Family Communication: Attempted to call patient's sister over the phone. VM is full. Unable to leave a message  Disposition: Status is: Observation The patient remains OBS appropriate and will d/c before 2 midnights.  Planned Discharge Destination: Hospice    Time spent: 35 minutes  Author: Aimee Somerset,  MD 02/06/2024 11:47 AM  For on call review [email or web address].  "

## 2024-02-15 DEATH — deceased
